# Patient Record
Sex: Female | Born: 1965 | Race: Black or African American | Hispanic: No | Smoking: Current every day smoker
Health system: Southern US, Community
[De-identification: ages and names within clinical notes are randomized; demographics above are authoritative.]

## PROBLEM LIST (undated history)

## (undated) DIAGNOSIS — F32A Depression, unspecified: Secondary | ICD-10-CM

## (undated) DIAGNOSIS — J849 Interstitial pulmonary disease, unspecified: Secondary | ICD-10-CM

## (undated) DIAGNOSIS — R51 Headache: Secondary | ICD-10-CM

## (undated) DIAGNOSIS — G4700 Insomnia, unspecified: Secondary | ICD-10-CM

## (undated) DIAGNOSIS — I509 Heart failure, unspecified: Secondary | ICD-10-CM

## (undated) DIAGNOSIS — F1010 Alcohol abuse, uncomplicated: Secondary | ICD-10-CM

## (undated) DIAGNOSIS — I639 Cerebral infarction, unspecified: Secondary | ICD-10-CM

## (undated) DIAGNOSIS — I5032 Chronic diastolic (congestive) heart failure: Secondary | ICD-10-CM

## (undated) DIAGNOSIS — F419 Anxiety disorder, unspecified: Secondary | ICD-10-CM

## (undated) DIAGNOSIS — M1990 Unspecified osteoarthritis, unspecified site: Secondary | ICD-10-CM

## (undated) DIAGNOSIS — I10 Essential (primary) hypertension: Secondary | ICD-10-CM

## (undated) DIAGNOSIS — J9620 Acute and chronic respiratory failure, unspecified whether with hypoxia or hypercapnia: Secondary | ICD-10-CM

## (undated) DIAGNOSIS — J84115 Respiratory bronchiolitis interstitial lung disease: Secondary | ICD-10-CM

## (undated) DIAGNOSIS — J9621 Acute and chronic respiratory failure with hypoxia: Secondary | ICD-10-CM

## (undated) DIAGNOSIS — G4730 Sleep apnea, unspecified: Secondary | ICD-10-CM

## (undated) DIAGNOSIS — J189 Pneumonia, unspecified organism: Secondary | ICD-10-CM

## (undated) DIAGNOSIS — IMO0001 Reserved for inherently not codable concepts without codable children: Secondary | ICD-10-CM

## (undated) DIAGNOSIS — Z9981 Dependence on supplemental oxygen: Secondary | ICD-10-CM

## (undated) DIAGNOSIS — F209 Schizophrenia, unspecified: Secondary | ICD-10-CM

## (undated) DIAGNOSIS — J9601 Acute respiratory failure with hypoxia: Secondary | ICD-10-CM

## (undated) DIAGNOSIS — R569 Unspecified convulsions: Secondary | ICD-10-CM

## (undated) DIAGNOSIS — F329 Major depressive disorder, single episode, unspecified: Secondary | ICD-10-CM

## (undated) HISTORY — DX: Acute respiratory failure with hypoxia: J96.01

## (undated) HISTORY — DX: Acute and chronic respiratory failure with hypoxia: J96.21

## (undated) HISTORY — DX: Acute and chronic respiratory failure, unspecified whether with hypoxia or hypercapnia: J96.20

## (undated) HISTORY — DX: Pneumonia, unspecified organism: J18.9

## (undated) HISTORY — PX: BACK SURGERY: SHX140

## (undated) HISTORY — PX: MULTIPLE TOOTH EXTRACTIONS: SHX2053

---

## 2010-11-04 ENCOUNTER — Emergency Department (HOSPITAL_COMMUNITY)
Admission: EM | Admit: 2010-11-04 | Discharge: 2010-11-04 | Disposition: A | Discharge status: Home / Self Care | Payer: Self-pay | Attending: Emergency Medicine | Admitting: Emergency Medicine

## 2010-11-04 DIAGNOSIS — K006 Disturbances in tooth eruption: Secondary | ICD-10-CM | POA: Insufficient documentation

## 2010-11-04 DIAGNOSIS — K047 Periapical abscess without sinus: Secondary | ICD-10-CM | POA: Insufficient documentation

## 2010-11-04 DIAGNOSIS — R220 Localized swelling, mass and lump, head: Secondary | ICD-10-CM | POA: Insufficient documentation

## 2010-11-04 DIAGNOSIS — K029 Dental caries, unspecified: Secondary | ICD-10-CM | POA: Insufficient documentation

## 2010-11-04 DIAGNOSIS — M549 Dorsalgia, unspecified: Secondary | ICD-10-CM | POA: Insufficient documentation

## 2010-11-04 DIAGNOSIS — K089 Disorder of teeth and supporting structures, unspecified: Secondary | ICD-10-CM | POA: Insufficient documentation

## 2010-11-04 DIAGNOSIS — G8929 Other chronic pain: Secondary | ICD-10-CM | POA: Insufficient documentation

## 2010-11-04 DIAGNOSIS — M249 Joint derangement, unspecified: Secondary | ICD-10-CM | POA: Insufficient documentation

## 2010-11-04 DIAGNOSIS — R221 Localized swelling, mass and lump, neck: Secondary | ICD-10-CM | POA: Insufficient documentation

## 2011-01-14 ENCOUNTER — Emergency Department (HOSPITAL_COMMUNITY)
Admission: EM | Admit: 2011-01-14 | Discharge: 2011-01-14 | Disposition: A | Discharge status: Home / Self Care | Payer: Self-pay | Attending: Emergency Medicine | Admitting: Emergency Medicine

## 2011-01-14 ENCOUNTER — Emergency Department (HOSPITAL_COMMUNITY): Payer: Self-pay

## 2011-01-14 DIAGNOSIS — M545 Low back pain, unspecified: Secondary | ICD-10-CM | POA: Insufficient documentation

## 2011-01-14 DIAGNOSIS — M25569 Pain in unspecified knee: Secondary | ICD-10-CM | POA: Insufficient documentation

## 2011-01-14 DIAGNOSIS — T148XXA Other injury of unspecified body region, initial encounter: Secondary | ICD-10-CM | POA: Insufficient documentation

## 2011-01-14 DIAGNOSIS — M79609 Pain in unspecified limb: Secondary | ICD-10-CM | POA: Insufficient documentation

## 2011-01-14 DIAGNOSIS — R000 Tachycardia, unspecified: Secondary | ICD-10-CM | POA: Insufficient documentation

## 2011-01-14 DIAGNOSIS — F17200 Nicotine dependence, unspecified, uncomplicated: Secondary | ICD-10-CM | POA: Insufficient documentation

## 2011-01-14 DIAGNOSIS — Z23 Encounter for immunization: Secondary | ICD-10-CM | POA: Insufficient documentation

## 2011-01-14 LAB — POCT PREGNANCY, URINE: Preg Test, Ur: NEGATIVE

## 2011-03-11 ENCOUNTER — Emergency Department (HOSPITAL_COMMUNITY): Payer: Self-pay

## 2011-03-11 ENCOUNTER — Emergency Department (HOSPITAL_COMMUNITY)
Admission: EM | Admit: 2011-03-11 | Discharge: 2011-03-11 | Disposition: A | Discharge status: Home / Self Care | Payer: Self-pay | Attending: Emergency Medicine | Admitting: Emergency Medicine

## 2011-03-11 DIAGNOSIS — R221 Localized swelling, mass and lump, neck: Secondary | ICD-10-CM | POA: Insufficient documentation

## 2011-03-11 DIAGNOSIS — R220 Localized swelling, mass and lump, head: Secondary | ICD-10-CM | POA: Insufficient documentation

## 2011-03-11 DIAGNOSIS — S01409A Unspecified open wound of unspecified cheek and temporomandibular area, initial encounter: Secondary | ICD-10-CM | POA: Insufficient documentation

## 2011-03-11 DIAGNOSIS — G8929 Other chronic pain: Secondary | ICD-10-CM | POA: Insufficient documentation

## 2011-03-11 DIAGNOSIS — M542 Cervicalgia: Secondary | ICD-10-CM | POA: Insufficient documentation

## 2011-03-11 DIAGNOSIS — R51 Headache: Secondary | ICD-10-CM | POA: Insufficient documentation

## 2011-03-11 DIAGNOSIS — R079 Chest pain, unspecified: Secondary | ICD-10-CM | POA: Insufficient documentation

## 2011-03-11 DIAGNOSIS — R1012 Left upper quadrant pain: Secondary | ICD-10-CM | POA: Insufficient documentation

## 2011-03-11 DIAGNOSIS — M549 Dorsalgia, unspecified: Secondary | ICD-10-CM | POA: Insufficient documentation

## 2011-03-11 LAB — CBC
MCH: 29 pg (ref 26.0–34.0)
Platelets: 226 10*3/uL (ref 150–400)
RBC: 4.66 MIL/uL (ref 3.87–5.11)
WBC: 8 10*3/uL (ref 4.0–10.5)

## 2011-03-11 LAB — COMPREHENSIVE METABOLIC PANEL
ALT: 21 U/L (ref 0–35)
AST: 26 U/L (ref 0–37)
CO2: 21 mEq/L (ref 19–32)
Calcium: 9.2 mg/dL (ref 8.4–10.5)
Chloride: 106 mEq/L (ref 96–112)
GFR calc non Af Amer: >60 mL/min (ref >60)
Potassium: 4.2 mEq/L (ref 3.5–5.1)
Sodium: 139 mEq/L (ref 135–145)

## 2011-03-11 LAB — DIFFERENTIAL
Basophils Absolute: 0 10*3/uL (ref 0.0–0.1)
Basophils Relative: 1 % (ref 0–1)
Eosinophils Absolute: 0.1 10*3/uL (ref 0.0–0.7)
Neutrophils Relative %: 44 % (ref 43–77)

## 2011-03-11 LAB — POCT PREGNANCY, URINE: Preg Test, Ur: NEGATIVE

## 2011-03-11 MED ORDER — IOHEXOL 300 MG/ML  SOLN
100.0000 mL | Freq: Once | INTRAMUSCULAR | Status: AC | PRN
  Administered 2011-03-11: 100 mL via INTRAVENOUS

## 2011-04-09 ENCOUNTER — Inpatient Hospital Stay (INDEPENDENT_AMBULATORY_CARE_PROVIDER_SITE_OTHER)
Admission: RE | Admit: 2011-04-09 | Discharge: 2011-04-09 | Disposition: A | Discharge status: Home / Self Care | Payer: Self-pay | Source: Ambulatory Visit | Attending: Family Medicine | Admitting: Family Medicine

## 2011-04-09 DIAGNOSIS — S0180XA Unspecified open wound of other part of head, initial encounter: Secondary | ICD-10-CM

## 2011-08-07 ENCOUNTER — Encounter (HOSPITAL_COMMUNITY): Payer: Self-pay | Admitting: Emergency Medicine

## 2011-08-07 ENCOUNTER — Emergency Department (HOSPITAL_COMMUNITY)
Admission: EM | Admit: 2011-08-07 | Discharge: 2011-08-07 | Disposition: A | Discharge status: Home / Self Care | Payer: Self-pay | Attending: Emergency Medicine | Admitting: Emergency Medicine

## 2011-08-07 DIAGNOSIS — R05 Cough: Secondary | ICD-10-CM | POA: Insufficient documentation

## 2011-08-07 DIAGNOSIS — R1110 Vomiting, unspecified: Secondary | ICD-10-CM | POA: Insufficient documentation

## 2011-08-07 DIAGNOSIS — R109 Unspecified abdominal pain: Secondary | ICD-10-CM | POA: Insufficient documentation

## 2011-08-07 DIAGNOSIS — R059 Cough, unspecified: Secondary | ICD-10-CM | POA: Insufficient documentation

## 2011-08-07 DIAGNOSIS — R197 Diarrhea, unspecified: Secondary | ICD-10-CM | POA: Insufficient documentation

## 2011-08-07 DIAGNOSIS — R112 Nausea with vomiting, unspecified: Secondary | ICD-10-CM | POA: Insufficient documentation

## 2011-08-07 DIAGNOSIS — F1020 Alcohol dependence, uncomplicated: Secondary | ICD-10-CM | POA: Insufficient documentation

## 2011-08-07 DIAGNOSIS — E860 Dehydration: Secondary | ICD-10-CM | POA: Insufficient documentation

## 2011-08-07 DIAGNOSIS — R10819 Abdominal tenderness, unspecified site: Secondary | ICD-10-CM | POA: Insufficient documentation

## 2011-08-07 DIAGNOSIS — R42 Dizziness and giddiness: Secondary | ICD-10-CM | POA: Insufficient documentation

## 2011-08-07 LAB — CBC
MCHC: 32.7 g/dL (ref 30.0–36.0)
Platelets: 226 10*3/uL (ref 150–400)
RDW: 13.5 % (ref 11.5–15.5)

## 2011-08-07 LAB — URINALYSIS, ROUTINE W REFLEX MICROSCOPIC
Nitrite: NEGATIVE
Specific Gravity, Urine: 1.02 (ref 1.005–1.030)
Urobilinogen, UA: 0.2 mg/dL (ref 0.0–1.0)

## 2011-08-07 LAB — COMPREHENSIVE METABOLIC PANEL
ALT: 21 U/L (ref 0–35)
Alkaline Phosphatase: 75 U/L (ref 39–117)
CO2: 23 mEq/L (ref 19–32)
Calcium: 9.1 mg/dL (ref 8.4–10.5)
GFR calc Af Amer: >90 mL/min (ref >90)
GFR calc non Af Amer: >90 mL/min (ref >90)
Glucose, Bld: 113 mg/dL — ABNORMAL HIGH (ref 70–99)
Sodium: 135 mEq/L (ref 135–145)

## 2011-08-07 LAB — URINE MICROSCOPIC-ADD ON

## 2011-08-07 LAB — LIPASE, BLOOD: Lipase: 23 U/L (ref 11–59)

## 2011-08-07 LAB — DIFFERENTIAL
Basophils Absolute: 0 10*3/uL (ref 0.0–0.1)
Basophils Relative: 0 % (ref 0–1)
Neutro Abs: 6.6 10*3/uL (ref 1.7–7.7)
Neutrophils Relative %: 60 % (ref 43–77)

## 2011-08-07 LAB — POCT PREGNANCY, URINE: Preg Test, Ur: NEGATIVE

## 2011-08-07 MED ORDER — ONDANSETRON HCL 4 MG/2ML IJ SOLN
4.0000 mg | Freq: Once | INTRAMUSCULAR | Status: AC
  Administered 2011-08-07: 4 mg via INTRAVENOUS
  Filled 2011-08-07: qty 2

## 2011-08-07 MED ORDER — PROMETHAZINE HCL 25 MG PO TABS: 25.0000 mg | ORAL_TABLET | Freq: Four times a day (QID) | ORAL | Status: AC | PRN

## 2011-08-07 MED ORDER — SODIUM CHLORIDE 0.9 % IV BOLUS (SEPSIS)
1000.0000 mL | Freq: Once | INTRAVENOUS | Status: AC
  Administered 2011-08-07: 1000 mL via INTRAVENOUS

## 2011-08-07 MED ORDER — GI COCKTAIL ~~LOC~~
30.0000 mL | Freq: Once | ORAL | Status: AC
  Administered 2011-08-07: 30 mL via ORAL
  Filled 2011-08-07: qty 30

## 2011-08-07 NOTE — ED Notes (Signed)
Patient with dizziness, nausea, vomiting and diarrhea since last night.  Patient states she is also having abdominal pain, tender in the upper quadrants than lower quadrants.  Patient has not had any appetite.

## 2011-08-07 NOTE — ED Notes (Signed)
Patient with dizziness, n/v/d that started last night.  Patient also has a cough.   Patient is hypertensive 220/104.  No PCP.
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
## 2011-10-31 ENCOUNTER — Encounter (HOSPITAL_COMMUNITY): Payer: Self-pay | Admitting: Emergency Medicine

## 2011-10-31 ENCOUNTER — Emergency Department (HOSPITAL_COMMUNITY)
Admission: EM | Admit: 2011-10-31 | Discharge: 2011-11-01 | Payer: Self-pay | Attending: Emergency Medicine | Admitting: Emergency Medicine

## 2011-10-31 DIAGNOSIS — IMO0002 Reserved for concepts with insufficient information to code with codable children: Secondary | ICD-10-CM | POA: Insufficient documentation

## 2011-10-31 DIAGNOSIS — X58XXXA Exposure to other specified factors, initial encounter: Secondary | ICD-10-CM | POA: Insufficient documentation

## 2011-10-31 DIAGNOSIS — F1010 Alcohol abuse, uncomplicated: Secondary | ICD-10-CM | POA: Insufficient documentation

## 2011-10-31 HISTORY — DX: Alcohol abuse, uncomplicated: F10.10
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
## 2011-11-01 ENCOUNTER — Emergency Department (HOSPITAL_COMMUNITY): Payer: Self-pay

## 2011-11-01 LAB — COMPREHENSIVE METABOLIC PANEL
AST: 86 U/L — ABNORMAL HIGH (ref 0–37)
Albumin: 3.7 g/dL (ref 3.5–5.2)
BUN: 12 mg/dL (ref 6–23)
Calcium: 7.9 mg/dL — ABNORMAL LOW (ref 8.4–10.5)
Chloride: 105 mEq/L (ref 96–112)
Creatinine, Ser: 0.61 mg/dL (ref 0.50–1.10)
Total Protein: 7.8 g/dL (ref 6.0–8.3)

## 2011-11-01 LAB — CBC
HCT: 39.4 % (ref 36.0–46.0)
Hemoglobin: 12.9 g/dL (ref 12.0–15.0)
MCH: 28.2 pg (ref 26.0–34.0)
MCHC: 32.7 g/dL (ref 30.0–36.0)
RDW: 14 % (ref 11.5–15.5)

## 2011-11-01 LAB — RAPID URINE DRUG SCREEN, HOSP PERFORMED
Amphetamines: NOT DETECTED
Benzodiazepines: NOT DETECTED
Cocaine: NOT DETECTED
Opiates: NOT DETECTED
Tetrahydrocannabinol: NOT DETECTED

## 2011-11-01 LAB — GLUCOSE, CAPILLARY

## 2011-11-01 LAB — DIFFERENTIAL
Basophils Absolute: 0 10*3/uL (ref 0.0–0.1)
Basophils Relative: 1 % (ref 0–1)
Eosinophils Absolute: 0.1 10*3/uL (ref 0.0–0.7)
Monocytes Absolute: 0.6 10*3/uL (ref 0.1–1.0)
Monocytes Relative: 7 % (ref 3–12)
Neutro Abs: 4.2 10*3/uL (ref 1.7–7.7)

## 2011-11-01 LAB — ETHANOL: Alcohol, Ethyl (B): 238 mg/dL — ABNORMAL HIGH (ref 0–11)
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
## 2011-11-01 NOTE — ED Notes (Signed)
More ice water given to patient
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
## 2011-11-01 NOTE — ED Provider Notes (Signed)
Medical screening examination/treatment/procedure(s) were performed by non-physician practitioner and as supervising physician I was immediately available for consultation/collaboration.   Teressa Lower, MD 11/01/11 [phone]

## 2011-11-01 NOTE — ED Notes (Signed)
After discussing with CHarge nurse and the PA, the team decided to keep her longer for observation, lab work will be drawn to further assist with the plan of care. Phlebotomy at bedside
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
## 2011-11-01 NOTE — ED Notes (Signed)
Pt transported to CT
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
## 2011-11-01 NOTE — ED Notes (Signed)
Attempted to call home number, no answer

## 2011-11-01 NOTE — ED Notes (Signed)
Bus pass given

## 2011-11-01 NOTE — ED Notes (Signed)
Pt returns to room
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
## 2011-11-01 NOTE — ED Notes (Signed)
Report received from Night RN. First contact with patient. Pt is asleep upon entering the room, pt sat 97% on 2 L [place], and 92% RA, pt woke up after this RN called her name. Pt noticed to have abrasions on her face, but in no acute distress.

## 2011-11-01 NOTE — Discharge Instructions (Signed)
Please read and follow all provided instructions.  Your diagnoses today include:  1. Intoxication     Tests performed today include:  CT of your head, face, and neck that did not show any broken bones or bleeding in your brain  X-rays of your right hip and leg that did not show show any broken bones  Blood tests that showed high alcohol  Vital signs. See below for your results today.   Medications prescribed:   None  Home care instructions:  Follow any educational materials contained in this packet.  Stop drinking alcohol to excess.   Follow-up instructions: Please follow-up with your primary care provider in the next 3 days for further evaluation of your symptoms. If you do not have a primary care doctor -- see below for referral information.   Return instructions:   Please return to the Emergency Department if you experience worsening symptoms.   Please return if you have any other emergent concerns.  Additional Information:  Your vital signs today were: BP 128/72  Pulse 94  Temp(Src) 98.6 F (37 C) (Oral)  Resp 16  SpO2 93% If your blood pressure (BP) was elevated above 135/85 this visit, please have this repeated by your doctor within one month. -------------- No Primary Care Doctor Call [facility]  [phone] Other agencies that provide inexpensive medical care    [place]  [facility]  [place]  [phone]    [facility]  [phone]    [facility]  [facility]  [phone] -------------- RESOURCE GUIDE:  Dental Problems  Patients with Medicaid: [facility] [phone] [address] Phone:  [phone]                                                   Phone:  [phone]  If unable to pay or uninsured, contact:  [facility] or [facility]. to become qualified for the adult dental clinic.  Chronic Pain Problems Contact [facility]  [phone] Patients need to be referred by their primary care doctor.  Insufficient Money for Medicine Contact [employer]:  call "211" or [place] [phone].  [place]  [phone] [facility]  [place]   [phone] (emergency services [phone])  Substance Abuse [facility]  [phone] [facility] [phone] [place] [phone] Chinita Pester [phone] Residential & Outpatient Substance Abuse Program  [phone]  Abuse/Neglect [place] [phone] [place] [phone] (After Hours)  Emergency [place] [phone]  [place] [facility] [place] [phone] [place] [phone]  [facility] [place]  [facility] Dept. [address]      [place] Phone:  [phone]                                   Phone:  [phone]                 Phone:  [place] Phone:  [place] [phone] (After Hours)

## 2011-11-01 NOTE — ED Notes (Signed)
Pt placed on O2 @ 2L [place], O2 increased to 97%

## 2011-11-01 NOTE — ED Notes (Signed)
Walk patient to the bathroom on crutches.  She did well without assists.

## 2011-11-01 NOTE — ED Notes (Signed)
Pt assisted to the restroom and back

## 2011-11-01 NOTE — ED Notes (Addendum)
Pt returned from xray, went back to sleep, unable to ambulate without assistance

## 2011-11-03 NOTE — ED Provider Notes (Signed)
Medical screening examination/treatment/procedure(s) were performed by non-physician practitioner and as supervising physician I was immediately available for consultation/collaboration.   Teressa Lower, MD 11/03/11 [phone]

## 2011-11-13 ENCOUNTER — Inpatient Hospital Stay (HOSPITAL_COMMUNITY)
Admission: AD | Admit: 2011-11-13 | Discharge: 2011-11-14 | Disposition: A | Discharge status: Home / Self Care | Payer: Self-pay | Source: Ambulatory Visit | Attending: Emergency Medicine | Admitting: Emergency Medicine

## 2011-11-13 ENCOUNTER — Encounter (HOSPITAL_COMMUNITY): Payer: Self-pay

## 2011-11-13 DIAGNOSIS — R109 Unspecified abdominal pain: Secondary | ICD-10-CM | POA: Insufficient documentation

## 2011-11-13 DIAGNOSIS — F1010 Alcohol abuse, uncomplicated: Secondary | ICD-10-CM | POA: Insufficient documentation

## 2011-11-13 DIAGNOSIS — IMO0002 Reserved for concepts with insufficient information to code with codable children: Secondary | ICD-10-CM

## 2011-11-13 HISTORY — DX: Essential (primary) hypertension: I10

## 2011-11-13 HISTORY — DX: Insomnia, unspecified: G47.00

## 2011-11-13 LAB — HCG, SERUM, QUALITATIVE: Preg, Serum: NEGATIVE

## 2011-11-13 LAB — CBC
HCT: 34.9 % — ABNORMAL LOW (ref 36.0–46.0)
Hemoglobin: 11.1 g/dL — ABNORMAL LOW (ref 12.0–15.0)
MCHC: 31.8 g/dL (ref 30.0–36.0)
RBC: 4.02 MIL/uL (ref 3.87–5.11)
WBC: 10.4 10*3/uL (ref 4.0–10.5)

## 2011-11-13 LAB — DIFFERENTIAL
Basophils Relative: 0 % (ref 0–1)
Lymphocytes Relative: 26 % (ref 12–46)
Monocytes Relative: 6 % (ref 3–12)
Neutro Abs: 7 10*3/uL (ref 1.7–7.7)
Neutrophils Relative %: 67 % (ref 43–77)

## 2011-11-13 LAB — RAPID URINE DRUG SCREEN, HOSP PERFORMED
Barbiturates: NOT DETECTED
Cocaine: NOT DETECTED
Tetrahydrocannabinol: NOT DETECTED

## 2011-11-13 LAB — TYPE AND SCREEN
ABO/RH(D): O POS
Antibody Screen: NEGATIVE
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
## 2011-11-13 NOTE — ED Notes (Signed)
CVU:DT14<HO> Expected date:<BR> Expected time:<BR> Means of arrival:<BR> Comments:<BR> Hold bed for [facility] -pt from MAU- ETOH
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
## 2011-11-14 ENCOUNTER — Encounter (HOSPITAL_COMMUNITY): Payer: Self-pay | Admitting: Emergency Medicine

## 2011-11-14 LAB — COMPREHENSIVE METABOLIC PANEL
Alkaline Phosphatase: 67 U/L (ref 39–117)
BUN: 10 mg/dL (ref 6–23)
Chloride: 108 mEq/L (ref 96–112)
GFR calc Af Amer: >90 mL/min (ref >90)
Glucose, Bld: 90 mg/dL (ref 70–99)
Potassium: 3.7 mEq/L (ref 3.5–5.1)
Total Bilirubin: 0.2 mg/dL — ABNORMAL LOW (ref 0.3–1.2)

## 2011-11-14 MED ORDER — SODIUM CHLORIDE 0.9 % IV BOLUS (SEPSIS)
1000.0000 mL | Freq: Once | INTRAVENOUS | Status: AC
  Administered 2011-11-14: 1000 mL via INTRAVENOUS

## 2011-11-14 NOTE — ED Provider Notes (Signed)
History     CSN: [id number]  Arrival date & time 11/13/11  2052   First MD Initiated Contact with Patient 11/14/11 0014      Chief Complaint  Patient presents with  . Abdominal Pain    (Consider location/radiation/quality/duration/timing/severity/associated sxs/prior treatment) HPI Patient is a 46 year old female with history of alcohol abuse who presents today by EMS after being initially evaluated at [facility]. Patient apparently was concerned she could be pregnant. Pregnancy test was negative and CBC at that location was unremarkable. Patient was transferred for management of her alcohol intoxication. Alcohol level was 401 at [facility]. Patient complains a 10 out of 10 abdominal pain. This is diffuse without radiation. Level V caveat history is otherwise limited secondary to patient intoxication. She denies history of pancreatitis or liver problems. Vital signs are stable. Past Medical History  Diagnosis Date  . Alcohol abuse   . Hypertension   . Insomnia     Past Surgical History  Procedure Date  . No past surgeries     History reviewed. No pertinent family history.  History  Substance Use Topics  . Smoking status: Current Some Day Smoker -- 0.5 packs/day    Types: Cigarettes  . Smokeless tobacco: Not on file  . Alcohol Use: Yes     patient drinks 5-6 40oz beers a day    OB History    Grav Para Term Preterm Abortions TAB SAB Ect Mult Living   0               Review of Systems  Unable to perform ROS: Other    Allergies  Review of patient's allergies indicates no known allergies.  Home Medications   Current Outpatient Rx  Name Route Sig Dispense Refill  . QUETIAPINE FUMARATE 50 MG PO TABS Oral Take 50 mg by mouth at bedtime.      BP 107/50  Pulse 87  Temp(Src) 98.1 F (36.7 C) (Oral)  Resp 17  SpO2 95%  LMP 10/14/2011  Physical Exam  Nursing note and vitals reviewed. Constitutional: She is oriented to person, place, and time. She  appears well-developed and well-nourished. No distress.       Disheveled, smells of alcohol  HENT:  Head: Normocephalic and atraumatic.  Eyes: EOM are normal. Pupils are equal, round, and reactive to light. No scleral icterus.       Scleral injection noted bilaterally  Neck: Normal range of motion.  Cardiovascular: Normal rate, regular rhythm, normal heart sounds and intact distal pulses.  Exam reveals no gallop and no friction rub.   No murmur heard. Pulmonary/Chest: Effort normal and breath sounds normal. No respiratory distress. She has no wheezes. She has no rales.  Abdominal: Soft. Bowel sounds are normal. She exhibits no distension. There is tenderness. There is no rebound and no guarding.       Diffuse tenderness to palpation  Musculoskeletal: Normal range of motion. She exhibits no edema and no tenderness.  Neurological: She is alert and oriented to person, place, and time. No cranial nerve deficit. She exhibits normal muscle tone. Coordination normal.  Skin: Skin is warm and dry. No rash noted.    ED Course  Procedures (including critical care time)  Labs Reviewed  CBC - Abnormal; Notable for the following:    Hemoglobin 11.1 (*)    HCT 34.9 (*)    All other components within normal limits  ETHANOL - Abnormal; Notable for the following:    Alcohol, Ethyl (B) 401 (*)  All other components within normal limits  URINE RAPID DRUG SCREEN (HOSP PERFORMED)  DIFFERENTIAL  TYPE AND SCREEN  HCG, SERUM, QUALITATIVE  ABO/RH  LIPASE, BLOOD  COMPREHENSIVE METABOLIC PANEL   No results found.   1. Intoxication       MDM  Patient was evaluated by myself. She complained of diffuse abdominal pain. Patient had CMP as well as lipase added to her workup. IV fluids were ordered. Patient was cooperative and protecting her airway. Previous evaluation had included an unremarkable CBC as well as an alcohol level for 1 and a negative pregnancy test. Patient has labs pending at this  time.  Patient had no findings concerning for pancreatitis or significant abnormalities of her liver function. Patient had improvement in her level of interaction with IV fluids. Patient will be discharged home once she is able to ambulate independently.        Chauncy Passy, MD 11/14/11 [phone]

## 2011-11-14 NOTE — Discharge Instructions (Signed)
RESOURCE GUIDE  Dental Problems  Patients with Medicaid: [facility]                     [phone]                                           Phone:  [phone]                                                  If unable to pay or uninsured, contact:  [facility] or [facility]. to become qualified for the adult dental clinic.  Chronic Pain Problems Contact [facility]  [phone] Patients need to be referred by their primary care doctor.  Insufficient Money for Medicine Contact [employer]:  call "211" or [place] [phone].  No Primary Care Doctor Call [facility]  [phone] Other agencies that provide inexpensive medical care    [place]  [phone]    [facility]  [place]  [phone]    [facility]  [phone]    [facility]  [phone]    [facility]  [phone]  Substance Abuse Resources Alcohol and Drug Services  [place] [phone] [facility] [phone] Residential & Outpatient Substance Abuse Program  [place]  [phone] [place]   [phone] (emergency services [phone])  Abuse/Neglect [place] [phone] [place] [phone] (After Hours)  Emergency [place] [phone]  [place] at the [place] [phone] [place] [phone]  MRSA Hotline #:   [phone]    [facility] Dept. [address]         [place] Phone:  [phone]                                  Phone:  [phone]                   Phone:  [place] Phone:  [place] [phone]  [phone] (After Hours)

## 2011-11-30 ENCOUNTER — Inpatient Hospital Stay (HOSPITAL_COMMUNITY)
Admission: AD | Admit: 2011-11-30 | Discharge: 2011-11-30 | Disposition: A | Discharge status: Home / Self Care | Payer: Self-pay | Source: Ambulatory Visit | Attending: Obstetrics & Gynecology | Admitting: Obstetrics & Gynecology

## 2011-11-30 DIAGNOSIS — F1010 Alcohol abuse, uncomplicated: Secondary | ICD-10-CM | POA: Insufficient documentation

## 2011-11-30 DIAGNOSIS — IMO0002 Reserved for concepts with insufficient information to code with codable children: Secondary | ICD-10-CM

## 2011-11-30 DIAGNOSIS — R109 Unspecified abdominal pain: Secondary | ICD-10-CM | POA: Insufficient documentation

## 2011-11-30 LAB — POCT PREGNANCY, URINE: Preg Test, Ur: NEGATIVE

## 2011-11-30 NOTE — MAU Note (Addendum)
Patient arrived via EMS. Was taken to restroom per patient request upon arrival. UPT performed by Dr. Glenna Durand request & found to be negative. Dr. Nehemiah Settle discussed need for transfer due to patient being intoxicated & not pregnant. Patient refuses to be transferred to [facility] and now refuses to be treated in MAU. Patient demanded that we remove the IV that EMS placed. Patient signed refusal for transfer form & elopement form.

## 2011-11-30 NOTE — MAU Provider Note (Signed)
  History     CSN: [id number]  Arrival date and time: 11/30/11 2247   None     No chief complaint on file.  HPI Is a 46 year old woman who was found by [facility] PD to be intoxicated with responded to a call. Patient was having slurred speech and unsteady gait. EMS was called the patient was brought to the MAU as she told EMS that she was pregnant and concerned that she was having a miscarriage due to the abdominal pain. Her blood pressure was 140s over 100. She denies drinking alcohol despite spilling strongly of alcohol.    Past Medical History  Diagnosis Date  . Alcohol abuse   . Hypertension   . Insomnia     Past Surgical History  Procedure Date  . No past surgeries     No family history on file.  History  Substance Use Topics  . Smoking status: Current Some Day Smoker -- 0.5 packs/day    Types: Cigarettes  . Smokeless tobacco: Not on file  . Alcohol Use: Yes     patient drinks 5-6 40oz beers a day    Allergies: No Known Allergies  Prescriptions prior to admission  Medication Sig Dispense Refill  . QUEtiapine (SEROQUEL) 50 MG tablet Take 50 mg by mouth at bedtime.        ROS Physical Exam   Blood pressure 134/100, pulse 84, temperature 98.1 F (36.7 C), temperature source Oral, resp. rate 20, last menstrual period 10/14/2011, SpO2 96.00%.  Physical Exam  Constitutional: She is oriented to person, place, and time. She appears well-developed and well-nourished.  GI: Bowel sounds are normal.  Neurological: She is alert and oriented to person, place, and time.  Psychiatric: Her mood appears anxious. Her affect is labile. Her speech is delayed and slurred. She is agitated and aggressive.   Urine pregnancy test negative MAU Course  Procedures    Assessment and Plan  #1 alcohol intoxication  Tended to transfer the patient to [facility] due to intoxication. Dr. Venora Maples accepted patient in transfer. Patient refused to go to hospital in left [place]. Patient refused to sign  forms. IV line pulled and patient left the hospital.  Loma [place] JEHIEL 11/30/2011, 10:54 PM

## 2011-12-05 ENCOUNTER — Inpatient Hospital Stay (HOSPITAL_COMMUNITY)
Admission: AD | Admit: 2011-12-05 | Discharge: 2011-12-05 | Disposition: A | Discharge status: Home / Self Care | Payer: Self-pay | Source: Ambulatory Visit | Attending: Obstetrics & Gynecology | Admitting: Obstetrics & Gynecology

## 2011-12-05 ENCOUNTER — Encounter (HOSPITAL_COMMUNITY): Payer: Self-pay | Admitting: Obstetrics and Gynecology

## 2011-12-05 DIAGNOSIS — R109 Unspecified abdominal pain: Secondary | ICD-10-CM | POA: Insufficient documentation

## 2011-12-05 DIAGNOSIS — F1010 Alcohol abuse, uncomplicated: Secondary | ICD-10-CM | POA: Insufficient documentation

## 2011-12-05 LAB — URINALYSIS, ROUTINE W REFLEX MICROSCOPIC
Bilirubin Urine: NEGATIVE
Ketones, ur: NEGATIVE mg/dL
Nitrite: NEGATIVE
Protein, ur: NEGATIVE mg/dL
Urobilinogen, UA: 0.2 mg/dL (ref 0.0–1.0)

## 2011-12-05 MED ORDER — GI COCKTAIL ~~LOC~~
30.0000 mL | Freq: Once | ORAL | Status: AC
  Administered 2011-12-05: 30 mL via ORAL
  Filled 2011-12-05: qty 30

## 2011-12-05 NOTE — MAU Provider Note (Signed)
History   Pt presents today c/o "naval pain". She states the pain comes and goes. She is currently intoxicated. She denies vag dc, bleeding, fever, or any other sx. She states she has only had a "40" to drink today. She denies any other problems.  CSN: [id number]  Arrival date and time: 12/05/11 1439   First Provider Initiated Contact with Patient 12/05/11 1758      Chief Complaint  Patient presents with  . Abdominal Pain   HPI  OB History    Grav Para Term Preterm Abortions TAB SAB Ect Mult Living   _0      Past Medical History  Diagnosis Date  . Alcohol abuse   . Hypertension   . Insomnia     Past Surgical History  Procedure Date  . No past surgeries     History reviewed. No pertinent family history.  History  Substance Use Topics  . Smoking status: Current Some Day Smoker -- 0.5 packs/day    Types: Cigarettes  . Smokeless tobacco: Not on file  . Alcohol Use: Yes     patient drinks 5-6 40oz beers a day    Allergies: No Known Allergies  No prescriptions prior to admission    Review of Systems  Constitutional: Negative for fever and chills.  Eyes: Negative for blurred vision and double vision.  Respiratory: Negative for cough, hemoptysis, sputum production, shortness of breath and wheezing.   Cardiovascular: Negative for chest pain and palpitations.  Gastrointestinal: Positive for abdominal pain. Negative for heartburn, nausea, vomiting, diarrhea and constipation.  Genitourinary: Negative for dysuria, urgency, frequency and hematuria.  Neurological: Negative for dizziness and headaches.  Psychiatric/Behavioral: Negative for depression and suicidal ideas.   Physical Exam   Blood pressure 132/74, pulse 90, temperature 97.8 F (36.6 C), temperature source Oral, resp. rate 18, height _1  (1.575 m), weight 159 lb 3.2 oz (72.213 kg), last menstrual period 10/05/2011.  Physical Exam  Nursing note and vitals reviewed. Constitutional: She is  oriented to person, place, and time. She appears well-developed and well-nourished. No distress.  HENT:  Head: Normocephalic.  Eyes: Conjunctivae are normal. Pupils are equal, round, and reactive to light.  GI: Soft. She exhibits no distension and no mass. There is tenderness (Some very mild epigastri pain.). There is no rebound and no guarding.  Neurological: She is alert and oriented to person, place, and time. She displays normal reflexes. No cranial nerve deficit. She exhibits normal muscle tone. Coordination normal.  Skin: Skin is warm and dry. She is not diaphoretic.  Psychiatric: She has a normal mood and affect. Her behavior is normal. Judgment and thought content normal.    MAU Course  Procedures  Results for orders placed during the hospital encounter of 12/05/11 (from the past 48 hour(s))  URINALYSIS, ROUTINE W REFLEX MICROSCOPIC     Status: Abnormal   Collection Time   12/05/11  2:45 PM      Component Value Range Comment   Color, Urine YELLOW  YELLOW     APPearance CLEAR  CLEAR     Specific Gravity, Urine <1.005 (*) 1.005 - 1.030     pH 5.5  5.0 - 8.0     Glucose, UA NEGATIVE  NEGATIVE (mg/dL)    Hgb urine dipstick MODERATE (*) NEGATIVE     Bilirubin Urine NEGATIVE  NEGATIVE     Ketones, ur NEGATIVE  NEGATIVE (mg/dL)    Protein, ur NEGATIVE  NEGATIVE (  mg/dL)    Urobilinogen, UA 0.2  0.0 - 1.0 (mg/dL)    Nitrite NEGATIVE  NEGATIVE     Leukocytes, UA NEGATIVE  NEGATIVE    URINE MICROSCOPIC-ADD ON     Status: Normal   Collection Time   12/05/11  2:45 PM      Component Value Range Comment   Squamous Epithelial / LPF RARE  RARE     RBC / HPF 0-2  <3 (RBC/hpf)    Bacteria, UA RARE  RARE     GI cocktail given to pt.  Discussed pt with Dr. Harolyn Rutherford.  Assessment and Plan  Abd pain: pt desires to leave. She states she is feeling fine. She has no other complaints. Discussed diet, activity, risks, and precautions.  Alinda Deem. Aradhana Gin III, DrHSc, MPAS, PA-C  12/05/2011, 6:24 PM

## 2011-12-05 NOTE — MAU Provider Note (Signed)
Attestation of Attending Supervision of Advanced Practitioner: Evaluation and management procedures were performed by the [facility] Fellow/PA/CNM/NP under my supervision and collaboration. Chart reviewed, and agree with management and plan.  Verita Schneiders, M.D. 12/05/2011 8:21 PM

## 2011-12-05 NOTE — MAU Note (Signed)
Pt presents to MAU with chief complaint of naval pain. Pt says it feels like someone is pulling at her belly button, says there is a big knot on her belly button. Pt is pre-menopausal, last period 2 months ago. The pain is chronic; has been going on for several months. Pt says its hard for her to go to the bathroom; urinate and move bowels.

## 2011-12-05 NOTE — MAU Note (Signed)
Patient admits to drinking a 40 ounce beer and a can of beer today, patient's speech is slurred.

## 2011-12-29 ENCOUNTER — Emergency Department (HOSPITAL_COMMUNITY)
Admission: EM | Admit: 2011-12-29 | Discharge: 2011-12-30 | Disposition: A | Discharge status: Home / Self Care | Payer: Medicaid Other | Attending: Emergency Medicine | Admitting: Emergency Medicine

## 2011-12-29 ENCOUNTER — Encounter (HOSPITAL_COMMUNITY): Payer: Self-pay | Admitting: *Deleted

## 2011-12-29 DIAGNOSIS — S0990XA Unspecified injury of head, initial encounter: Secondary | ICD-10-CM

## 2011-12-29 DIAGNOSIS — F10929 Alcohol use, unspecified with intoxication, unspecified: Secondary | ICD-10-CM

## 2011-12-29 DIAGNOSIS — W03XXXA Other fall on same level due to collision with another person, initial encounter: Secondary | ICD-10-CM | POA: Insufficient documentation

## 2011-12-29 DIAGNOSIS — M546 Pain in thoracic spine: Secondary | ICD-10-CM | POA: Insufficient documentation

## 2011-12-29 DIAGNOSIS — H11419 Vascular abnormalities of conjunctiva, unspecified eye: Secondary | ICD-10-CM | POA: Insufficient documentation

## 2011-12-29 DIAGNOSIS — M545 Low back pain, unspecified: Secondary | ICD-10-CM | POA: Insufficient documentation

## 2011-12-29 DIAGNOSIS — M549 Dorsalgia, unspecified: Secondary | ICD-10-CM

## 2011-12-29 DIAGNOSIS — F1010 Alcohol abuse, uncomplicated: Secondary | ICD-10-CM | POA: Insufficient documentation

## 2011-12-29 DIAGNOSIS — M5380 Other specified dorsopathies, site unspecified: Secondary | ICD-10-CM | POA: Insufficient documentation
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
## 2011-12-30 ENCOUNTER — Encounter (HOSPITAL_COMMUNITY): Payer: Self-pay | Admitting: Radiology

## 2011-12-30 ENCOUNTER — Emergency Department (HOSPITAL_COMMUNITY): Payer: Medicaid Other

## 2011-12-30 LAB — URINALYSIS, ROUTINE W REFLEX MICROSCOPIC
Bilirubin Urine: NEGATIVE
Glucose, UA: NEGATIVE mg/dL
Hgb urine dipstick: NEGATIVE
Ketones, ur: NEGATIVE mg/dL
Leukocytes, UA: NEGATIVE
Nitrite: NEGATIVE
Protein, ur: NEGATIVE mg/dL
Specific Gravity, Urine: 1.009 (ref 1.005–1.030)
Urobilinogen, UA: 0.2 mg/dL (ref 0.0–1.0)
pH: 5 (ref 5.0–8.0)

## 2011-12-30 LAB — ETHANOL: Alcohol, Ethyl (B): 348 mg/dL — ABNORMAL HIGH (ref 0–11)

## 2011-12-30 LAB — POCT PREGNANCY, URINE: Preg Test, Ur: NEGATIVE

## 2011-12-30 MED ORDER — HYDROCODONE-ACETAMINOPHEN 5-325 MG PO TABS
1.0000 | ORAL_TABLET | Freq: Once | ORAL | Status: AC
  Administered 2011-12-30: 1 via ORAL
  Filled 2011-12-30: qty 1

## 2011-12-30 MED ORDER — LORAZEPAM 2 MG/ML IJ SOLN
1.0000 mg | Freq: Once | INTRAMUSCULAR | Status: AC
  Administered 2011-12-30: 1 mg via INTRAMUSCULAR
  Filled 2011-12-30: qty 1

## 2011-12-30 MED ORDER — IBUPROFEN 800 MG PO TABS
800.0000 mg | ORAL_TABLET | Freq: Once | ORAL | Status: AC
  Administered 2011-12-30: 800 mg via ORAL
  Filled 2011-12-30: qty 1

## 2011-12-30 NOTE — ED Provider Notes (Signed)
Patient was originally seen by Dr. Dorna Mai and was signed out to Dr. Vanessa Kick after she was intoxicated and had neck injury was sent to the CDU for holding by Dr. Vanessa Kick awaiting for patient to sober and then recheck her neck exam. Up to this point her imaging has been negative however due to her intoxication we cannot completely clear her C-spine. She sitting up in bed eating and drinking and is improving however she still has a significantly elevated EtOH level. Patient states she does not family or friends to come get her from ER and therefore she would be taking the bus home and needs to be more sober before we can safely discharge her from the ER.  Anne Ng, [place] 12/30/11 1404  Patient is ambulating without difficulty to bathroom in ER and is mentating well. Normal coordination. mild soft tissue TTP of neck but good ROM with little pain. 5/5 strength of bilateral UE and normal reflexes. Normal sensation. No concern for central cord compression with no acute findings on imaging of neck. We are checking with social work to see if a taxi voucher is available. If not, patient will be ready for d/c in the near future.   [place], [place] 12/30/11 1510

## 2011-12-30 NOTE — ED Notes (Signed)
Patient opens her eyes when spoken to
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
## 2011-12-30 NOTE — Discharge Instructions (Signed)
 Alcohol Intoxication   You have alcohol intoxication when the amount of alcohol that you have consumed has impaired your ability to mentally and physically function. There are a variety of factors that contribute to the level at which alcohol intoxication can occur, such as age, gender, weight, frequency of alcohol consumption, medication use, and the presence of other medical conditions, such as diabetes, seizures, or heart conditions.   The blood alcohol level test measures the concentration of alcohol in your blood. In most states, your blood alcohol level must be lower than 80 mg/dL (1.61%) to legally drive. However, many dangerous effects of alcohol can occur at much lower levels.   Alcohol directly impairs the normal chemical activity of the brain and is said to be a chemical depressant. Alcohol can cause drowsiness, stupor, respiratory failure, and coma. Other physical effects can include headache, vomiting, vomiting of blood, abdominal pain, a fast heartbeat, difficulty breathing, anxiety, and amnesia. Alcohol intoxication can also lead to dangerous and life-threatening activities, such as fighting, dangerous operation of vehicles or heavy machinery, and risky sexual behavior.   Alcohol can be especially dangerous when taken with other drugs. Some of these drugs are:   Sedatives.   Painkillers.   Marijuana.   Tranquilizers.   Antihistamines.   Muscle relaxants.   Seizure medicine.  Many of the effects of acute alcohol intoxication are temporary. However, repeated alcohol intoxication can lead to severe medical illnesses. If you have alcohol intoxication, you should:   Stay hydrated. Drink enough water and fluids to keep your urine clear or pale yellow. Avoid excessive caffeine because this can further lead to dehydration.   Eat a healthy diet. You may have residual nausea, headache, and loss of appetite, but it is still important that you maintain good nutrition. You can start with clear liquids.   Take  nonsteroidal anti-inflammatory medications as needed for headaches, but make sure to do so with small meals. You should avoid acetaminophen for several days after having alcohol intoxication because the combination of alcohol and acetaminophen can be toxic to your liver.  If you have frequent alcohol intoxication, ask your friends and family if they think you have a drinking problem. For further help, contact:   Your caregiver.   Alcoholics Anonymous (AA).   A drug or alcohol rehabilitation program.  SEEK MEDICAL CARE IF:   You have persistent vomiting.   You have persistent pain in any part of your body.   You do not feel better after a few days.  SEEK IMMEDIATE MEDICAL CARE IF:   You become shaky or tremble when you try to stop drinking.   You shake uncontrollably (seizure).   You throw up (vomit) blood. This may be bright red or it may look like black coffee grounds.   You have blood in the stool. This may be bright red or appear as a black, tarry, bad smelling stool.   You become lightheaded or faint.  ANY OF THESE SYMPTOMS MAY REPRESENT A SERIOUS PROBLEM THAT IS AN EMERGENCY. Do not wait to see if the symptoms will go away. Get medical help right away. Call your local emergency services (911 in [place] DO NOT drive yourself to the hospital.   MAKE SURE YOU:   Understand these instructions.   Will watch your condition.   Will get help right away if you are not doing well or get worse.  Document Released: 03/25/2005 Document Revised: 06/04/2011 Document Reviewed: 12/02/2009   [facility] Patient Information 2012 [place], [place].

## 2011-12-30 NOTE — ED Notes (Signed)
Pt given taxi voucher transportation home.  Per RN and PA, she is not appropriate to ride the city bus.  Per nurse, pt is not interested in substance abuse resources/treatment at this time.

## 2011-12-30 NOTE — ED Notes (Signed)
Room air sats  88-92   Oxygen applied [place] 2liters.

## 2011-12-30 NOTE — ED Provider Notes (Signed)
Medical screening examination/treatment/procedure(s) were performed by non-physician practitioner and as supervising physician I was immediately available for consultation/collaboration.  Barbara Cower, MD 12/30/11 1531

## 2011-12-30 NOTE — ED Notes (Signed)
Pt.ambulate to  Bathroom with assit.pt.had bowelment _0 

## 2011-12-30 NOTE — ED Notes (Signed)
Spoke with jody Education officer, [employer]. She is aware pt wanting to go so she can find somewhere to sleep tonight.

## 2012-02-19 ENCOUNTER — Encounter (HOSPITAL_COMMUNITY): Payer: Self-pay | Admitting: Emergency Medicine

## 2012-02-19 ENCOUNTER — Inpatient Hospital Stay (HOSPITAL_COMMUNITY): Payer: Medicaid Other

## 2012-02-19 ENCOUNTER — Inpatient Hospital Stay (HOSPITAL_COMMUNITY)
Admission: EM | Admit: 2012-02-19 | Discharge: 2012-02-23 | DRG: 897 | Disposition: A | Discharge status: Home / Self Care | Payer: Medicaid Other | Attending: Internal Medicine | Admitting: Internal Medicine

## 2012-02-19 ENCOUNTER — Emergency Department (HOSPITAL_COMMUNITY): Payer: Medicaid Other

## 2012-02-19 DIAGNOSIS — F17200 Nicotine dependence, unspecified, uncomplicated: Secondary | ICD-10-CM | POA: Diagnosis present

## 2012-02-19 DIAGNOSIS — K921 Melena: Secondary | ICD-10-CM | POA: Diagnosis not present

## 2012-02-19 DIAGNOSIS — A599 Trichomoniasis, unspecified: Secondary | ICD-10-CM

## 2012-02-19 DIAGNOSIS — E872 Acidosis, unspecified: Secondary | ICD-10-CM | POA: Diagnosis present

## 2012-02-19 DIAGNOSIS — N390 Urinary tract infection, site not specified: Secondary | ICD-10-CM | POA: Diagnosis present

## 2012-02-19 DIAGNOSIS — N12 Tubulo-interstitial nephritis, not specified as acute or chronic: Secondary | ICD-10-CM

## 2012-02-19 DIAGNOSIS — E860 Dehydration: Secondary | ICD-10-CM

## 2012-02-19 DIAGNOSIS — F1020 Alcohol dependence, uncomplicated: Secondary | ICD-10-CM | POA: Diagnosis present

## 2012-02-19 DIAGNOSIS — E878 Other disorders of electrolyte and fluid balance, not elsewhere classified: Secondary | ICD-10-CM

## 2012-02-19 DIAGNOSIS — R7402 Elevation of levels of lactic acid dehydrogenase (LDH): Secondary | ICD-10-CM | POA: Diagnosis present

## 2012-02-19 DIAGNOSIS — I10 Essential (primary) hypertension: Secondary | ICD-10-CM | POA: Diagnosis present

## 2012-02-19 DIAGNOSIS — F10939 Alcohol use, unspecified with withdrawal, unspecified: Principal | ICD-10-CM | POA: Diagnosis present

## 2012-02-19 DIAGNOSIS — R197 Diarrhea, unspecified: Secondary | ICD-10-CM | POA: Diagnosis present

## 2012-02-19 DIAGNOSIS — F1010 Alcohol abuse, uncomplicated: Secondary | ICD-10-CM

## 2012-02-19 DIAGNOSIS — R7401 Elevation of levels of liver transaminase levels: Secondary | ICD-10-CM | POA: Diagnosis present

## 2012-02-19 DIAGNOSIS — F10239 Alcohol dependence with withdrawal, unspecified: Principal | ICD-10-CM | POA: Diagnosis present

## 2012-02-19 DIAGNOSIS — D509 Iron deficiency anemia, unspecified: Secondary | ICD-10-CM | POA: Diagnosis present

## 2012-02-19 DIAGNOSIS — R112 Nausea with vomiting, unspecified: Secondary | ICD-10-CM

## 2012-02-19 DIAGNOSIS — N179 Acute kidney failure, unspecified: Secondary | ICD-10-CM | POA: Diagnosis present

## 2012-02-19 DIAGNOSIS — B75 Trichinellosis: Secondary | ICD-10-CM | POA: Diagnosis present

## 2012-02-19 DIAGNOSIS — E871 Hypo-osmolality and hyponatremia: Secondary | ICD-10-CM

## 2012-02-19 DIAGNOSIS — N289 Disorder of kidney and ureter, unspecified: Secondary | ICD-10-CM

## 2012-02-19 DIAGNOSIS — E876 Hypokalemia: Secondary | ICD-10-CM

## 2012-02-19 DIAGNOSIS — R109 Unspecified abdominal pain: Secondary | ICD-10-CM

## 2012-02-19 DIAGNOSIS — A088 Other specified intestinal infections: Secondary | ICD-10-CM | POA: Diagnosis present

## 2012-02-19 DIAGNOSIS — D649 Anemia, unspecified: Secondary | ICD-10-CM

## 2012-02-19 DIAGNOSIS — D72829 Elevated white blood cell count, unspecified: Secondary | ICD-10-CM

## 2012-02-19 LAB — CBC WITH DIFFERENTIAL/PLATELET
Eosinophils Absolute: 0 10*3/uL (ref 0.0–0.7)
Eosinophils Relative: 0 % (ref 0–5)
Hemoglobin: 10.9 g/dL — ABNORMAL LOW (ref 12.0–15.0)
Lymphocytes Relative: 8 % — ABNORMAL LOW (ref 12–46)
Lymphs Abs: 1.6 10*3/uL (ref 0.7–4.0)
MCH: 28.8 pg (ref 26.0–34.0)
MCV: 86 fL (ref 78.0–100.0)
Monocytes Relative: 16 % — ABNORMAL HIGH (ref 3–12)
Neutrophils Relative %: 76 % (ref 43–77)
Platelets: 185 10*3/uL (ref 150–400)
RBC: 3.78 MIL/uL — ABNORMAL LOW (ref 3.87–5.11)
WBC: 18.7 10*3/uL — ABNORMAL HIGH (ref 4.0–10.5)

## 2012-02-19 LAB — BASIC METABOLIC PANEL
BUN: 14 mg/dL (ref 6–23)
CO2: 22 mEq/L (ref 19–32)
CO2: 25 mEq/L (ref 19–32)
Chloride: 101 mEq/L (ref 96–112)
GFR calc Af Amer: 75 mL/min — ABNORMAL LOW (ref >90)
GFR calc non Af Amer: 65 mL/min — ABNORMAL LOW (ref >90)
Glucose, Bld: 138 mg/dL — ABNORMAL HIGH (ref 70–99)
Potassium: 2.8 mEq/L — ABNORMAL LOW (ref 3.5–5.1)
Sodium: 131 mEq/L — ABNORMAL LOW (ref 135–145)
Sodium: 136 mEq/L (ref 135–145)

## 2012-02-19 LAB — HEPATIC FUNCTION PANEL
ALT: 34 U/L (ref 0–35)
AST: 50 U/L — ABNORMAL HIGH (ref 0–37)
Albumin: 2.9 g/dL — ABNORMAL LOW (ref 3.5–5.2)
Alkaline Phosphatase: 195 U/L — ABNORMAL HIGH (ref 39–117)
Total Bilirubin: 1.3 mg/dL — ABNORMAL HIGH (ref 0.3–1.2)
Total Protein: 8.4 g/dL — ABNORMAL HIGH (ref 6.0–8.3)

## 2012-02-19 LAB — MAGNESIUM: Magnesium: 2 mg/dL (ref 1.5–2.5)

## 2012-02-19 LAB — RAPID URINE DRUG SCREEN, HOSP PERFORMED
Amphetamines: NOT DETECTED
Barbiturates: NOT DETECTED
Benzodiazepines: NOT DETECTED
Tetrahydrocannabinol: NOT DETECTED

## 2012-02-19 LAB — URINALYSIS, ROUTINE W REFLEX MICROSCOPIC
Glucose, UA: NEGATIVE mg/dL
Specific Gravity, Urine: 1.014 (ref 1.005–1.030)
pH: 5.5 (ref 5.0–8.0)

## 2012-02-19 LAB — SODIUM, URINE, RANDOM: Sodium, Ur: <10 mEq/L

## 2012-02-19 LAB — PREGNANCY, URINE: Preg Test, Ur: NEGATIVE

## 2012-02-19 LAB — URINE MICROSCOPIC-ADD ON

## 2012-02-19 LAB — CBC
Hemoglobin: 9.5 g/dL — ABNORMAL LOW (ref 12.0–15.0)
MCH: 28.5 pg (ref 26.0–34.0)
MCHC: 32.6 g/dL (ref 30.0–36.0)
MCV: 87.4 fL (ref 78.0–100.0)
Platelets: 185 10*3/uL (ref 150–400)

## 2012-02-19 LAB — ETHANOL: Alcohol, Ethyl (B): <11 mg/dL (ref 0–11)

## 2012-02-19 LAB — OCCULT BLOOD X 1 CARD TO LAB, STOOL: Fecal Occult Bld: NEGATIVE

## 2012-02-19 LAB — RETICULOCYTES: Retic Ct Pct: 1.3 % (ref 0.4–3.1)

## 2012-02-19 LAB — CLOSTRIDIUM DIFFICILE BY PCR: Toxigenic C. Difficile by PCR: NEGATIVE

## 2012-02-19 LAB — LIPASE, BLOOD: Lipase: 49 U/L (ref 11–59)

## 2012-02-19 MED ORDER — METRONIDAZOLE 500 MG PO TABS
2000.0000 mg | ORAL_TABLET | Freq: Once | ORAL | Status: AC
  Administered 2012-02-19: 2000 mg via ORAL
  Filled 2012-02-19 (×2): qty 4

## 2012-02-19 MED ORDER — ONDANSETRON HCL 4 MG PO TABS: 4.0000 mg | ORAL_TABLET | Freq: Four times a day (QID) | ORAL | Status: DC | PRN

## 2012-02-19 MED ORDER — ONDANSETRON HCL 4 MG/2ML IJ SOLN
4.0000 mg | Freq: Once | INTRAMUSCULAR | Status: AC
  Administered 2012-02-19: 4 mg via INTRAVENOUS
  Filled 2012-02-19: qty 2

## 2012-02-19 MED ORDER — SODIUM CHLORIDE 0.9 % IV SOLN
1000.0000 mL | Freq: Once | INTRAVENOUS | Status: AC
  Administered 2012-02-19: 1000 mL via INTRAVENOUS

## 2012-02-19 MED ORDER — HEPARIN SODIUM (PORCINE) 5000 UNIT/ML IJ SOLN
5000.0000 [IU] | Freq: Three times a day (TID) | INTRAMUSCULAR | Status: DC
  Administered 2012-02-19 – 2012-02-23 (×10): 5000 [IU] via SUBCUTANEOUS
  Filled 2012-02-19 (×15): qty 1

## 2012-02-19 MED ORDER — DEXTROSE 5 % IV SOLN
1.0000 g | Freq: Once | INTRAVENOUS | Status: AC
  Administered 2012-02-19: 1 g via INTRAVENOUS
  Filled 2012-02-19: qty 10

## 2012-02-19 MED ORDER — ONDANSETRON HCL 4 MG/2ML IJ SOLN
4.0000 mg | Freq: Four times a day (QID) | INTRAMUSCULAR | Status: DC | PRN
  Administered 2012-02-19: 4 mg via INTRAVENOUS
  Filled 2012-02-19: qty 2

## 2012-02-19 MED ORDER — ADULT MULTIVITAMIN W/MINERALS CH
1.0000 | ORAL_TABLET | Freq: Every day | ORAL | Status: DC
  Administered 2012-02-19 – 2012-02-23 (×5): 1 via ORAL
  Filled 2012-02-19 (×5): qty 1

## 2012-02-19 MED ORDER — METOCLOPRAMIDE HCL 5 MG/ML IJ SOLN
10.0000 mg | Freq: Once | INTRAMUSCULAR | Status: AC
  Administered 2012-02-19: 10 mg via INTRAVENOUS
  Filled 2012-02-19: qty 2

## 2012-02-19 MED ORDER — MORPHINE SULFATE 4 MG/ML IJ SOLN
4.0000 mg | Freq: Once | INTRAMUSCULAR | Status: AC
  Administered 2012-02-19: 4 mg via INTRAVENOUS
  Filled 2012-02-19: qty 1

## 2012-02-19 MED ORDER — IOHEXOL 300 MG/ML  SOLN
20.0000 mL | INTRAMUSCULAR | Status: AC
Start: 2012-02-19 — End: 2012-02-19
  Administered 2012-02-19 (×2): 20 mL via ORAL

## 2012-02-19 MED ORDER — ONDANSETRON HCL 4 MG/2ML IJ SOLN: 4.0000 mg | INTRAMUSCULAR | Status: DC | PRN

## 2012-02-19 MED ORDER — MORPHINE SULFATE 4 MG/ML IJ SOLN
4.0000 mg | INTRAMUSCULAR | Status: AC | PRN
  Administered 2012-02-19: 4 mg via INTRAVENOUS
  Filled 2012-02-19: qty 1

## 2012-02-19 MED ORDER — DEXTROSE 5 % IV SOLN
1.0000 g | INTRAVENOUS | Status: DC
  Administered 2012-02-20 – 2012-02-22 (×3): 1 g via INTRAVENOUS
  Filled 2012-02-19 (×4): qty 10

## 2012-02-19 MED ORDER — IOHEXOL 300 MG/ML  SOLN
20.0000 mL | INTRAMUSCULAR | Status: DC
  Administered 2012-02-19: 20 mL via ORAL

## 2012-02-19 MED ORDER — ACETAMINOPHEN 650 MG RE SUPP: 650.0000 mg | Freq: Four times a day (QID) | RECTAL | Status: DC | PRN

## 2012-02-19 MED ORDER — FOLIC ACID 5 MG/ML IJ SOLN
1.0000 mg | Freq: Every day | INTRAMUSCULAR | Status: DC
  Administered 2012-02-19: 1 mg via INTRAVENOUS
  Filled 2012-02-19: qty 0.2

## 2012-02-19 MED ORDER — DIPHENHYDRAMINE HCL 50 MG/ML IJ SOLN
25.0000 mg | Freq: Once | INTRAMUSCULAR | Status: AC
  Administered 2012-02-19: 25 mg via INTRAVENOUS
  Filled 2012-02-19: qty 1

## 2012-02-19 MED ORDER — POTASSIUM CHLORIDE 10 MEQ/100ML IV SOLN
10.0000 meq | INTRAVENOUS | Status: AC
  Administered 2012-02-19 – 2012-02-20 (×4): 10 meq via INTRAVENOUS
  Filled 2012-02-19 (×4): qty 100

## 2012-02-19 MED ORDER — LOPERAMIDE HCL 2 MG PO CAPS
2.0000 mg | ORAL_CAPSULE | ORAL | Status: DC | PRN
  Filled 2012-02-19: qty 1

## 2012-02-19 MED ORDER — HYDROMORPHONE HCL PF 1 MG/ML IJ SOLN: 0.5000 mg | INTRAMUSCULAR | Status: DC | PRN

## 2012-02-19 MED ORDER — ACETAMINOPHEN 325 MG PO TABS
650.0000 mg | ORAL_TABLET | Freq: Four times a day (QID) | ORAL | Status: DC | PRN
  Administered 2012-02-19 – 2012-02-21 (×4): 650 mg via ORAL
  Filled 2012-02-19 (×4): qty 2

## 2012-02-19 MED ORDER — LORAZEPAM 2 MG/ML IJ SOLN
1.0000 mg | Freq: Four times a day (QID) | INTRAMUSCULAR | Status: AC | PRN
  Administered 2012-02-19: 1 mg via INTRAVENOUS
  Filled 2012-02-19: qty 1

## 2012-02-19 MED ORDER — FOLIC ACID 1 MG PO TABS
1.0000 mg | ORAL_TABLET | Freq: Every day | ORAL | Status: DC
  Administered 2012-02-20 – 2012-02-23 (×4): 1 mg via ORAL
  Filled 2012-02-19 (×5): qty 1

## 2012-02-19 MED ORDER — THIAMINE HCL 100 MG/ML IJ SOLN
100.0000 mg | Freq: Every day | INTRAMUSCULAR | Status: DC
  Administered 2012-02-19: 100 mg via INTRAVENOUS
  Filled 2012-02-19: qty 1

## 2012-02-19 MED ORDER — POTASSIUM CHLORIDE IN NACL 20-0.9 MEQ/L-% IV SOLN
INTRAVENOUS | Status: DC
  Administered 2012-02-19 – 2012-02-20 (×3): via INTRAVENOUS
  Filled 2012-02-19 (×5): qty 1000

## 2012-02-19 MED ORDER — VITAMIN B-1 100 MG PO TABS
100.0000 mg | ORAL_TABLET | Freq: Every day | ORAL | Status: DC
  Administered 2012-02-20 – 2012-02-23 (×4): 100 mg via ORAL
  Filled 2012-02-19 (×5): qty 1

## 2012-02-19 MED ORDER — SODIUM CHLORIDE 0.9 % IV SOLN
1000.0000 mL | INTRAVENOUS | Status: DC
  Administered 2012-02-19: 1000 mL via INTRAVENOUS

## 2012-02-19 MED ORDER — LORAZEPAM 1 MG PO TABS
1.0000 mg | ORAL_TABLET | Freq: Four times a day (QID) | ORAL | Status: AC | PRN
  Administered 2012-02-21 (×2): 1 mg via ORAL
  Filled 2012-02-19 (×2): qty 1

## 2012-02-19 MED ORDER — THIAMINE HCL 100 MG/ML IJ SOLN
100.0000 mg | Freq: Every day | INTRAMUSCULAR | Status: DC
  Filled 2012-02-19 (×5): qty 1

## 2012-02-19 MED ORDER — SODIUM CHLORIDE 0.9 % IV SOLN
INTRAVENOUS | Status: AC
  Administered 2012-02-19: 10:00:00 via INTRAVENOUS

## 2012-02-19 MED ORDER — POTASSIUM CHLORIDE 10 MEQ/100ML IV SOLN
10.0000 meq | INTRAVENOUS | Status: AC
  Administered 2012-02-19 (×3): 10 meq via INTRAVENOUS
  Filled 2012-02-19 (×3): qty 100

## 2012-02-19 NOTE — H&P (Signed)
Hospital Admission Note Date: 02/19/2012  Patient name: Diane Gilbert Medical record number: [id number] Date of birth: 05-23-1966 Age: 46 y.o. Gender: female PCP: DEFAULT,PROVIDER, MD  Medical Service: Internal Medicine Teaching Service  Attending physician:  Dr. Lynnae January    1st Contact:  Dr. Sissy Hoff   Pager: [phone] 2nd Contact:  Dr. Leonia Reeves   [phone] After 5 pm or weekends: 1st Contact:      Pager: [phone] 2nd Contact:      Pager: [phone]  Chief Complaint: abdominal pain, N/V, diarrhea, cough  History of Present Illness: Diane Gilbert is a 46 yo woman with PMH of alcohol abuse who drinks about 4-6 of 40 ounces of beer per day presents to the ED for complaints of abdominal pain, back pain, N/V, fever, cough x 2-3 weeks in duration. Patient reported that her abdominal pain is constant,  sore and sharp in quality, 9/10 in severity, locating at the right and left upper quadrant without any radiation.  Her abdominal pain is not associated with food intake and no alleviation factor. Patient also has subjective fever at home in the past 2 weeks with chills. She also has unintentional weight loss do to her inability to keep down her food. She also has nausea and vomiting about 3-4 times a day which is nonbloody and nonbilious. She also reported having watery diarrhea that is nonbloody in the past 2 weeks and she has about 4-5 episodes per day. She denies any recent travel or eating seafood. Patient denies any dysuria, burning sensation, vaginal discharge or malodor. Her last menstrual period was 01/28/2012. Her last sexual activity was about 3 weeks ago with her female partner. She reports only have one partner in the past 3 months. Her last alcoholic drink was one day prior to admission which she drank about 24 ounces to help her go to sleep. She denies any chest pain or shortness of breath. She denies any similar episodes in the past and states that this is her first time having abdominal pain  and diarrhea.   Meds: None  Allergies: Allergies as of 02/19/2012  . (No Known Allergies)   Past Medical History  Diagnosis Date  . Alcohol abuse   . Hypertension   . Insomnia    Past Surgical History  Procedure Date  . No past surgeries    Family History: mother has cancer but patient does not know exact type   History   Social History  . Marital Status: Single    Spouse Name: N/A    Number of Children: N/A  . Years of Education: N/A   Occupational History  . Not on file.   Social History Main Topics  . Smoking status: Current Some Day Smoker -- 0.5 packs/day x 20 years    Types: Cigarettes  . Smokeless tobacco: Not on file  . Alcohol Use: Yes     patient drinks 5-6 40oz beers a day since age of 58  . Drug Use: No  . 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 She states that she did cut a college but did not finish because her brother passed away.  Review of Systems: Constitutional: +fever, chills, diaphoresis, appetite change and fatigue.  HEENT: Denies photophobia, eye pain, redness, hearing loss, ear pain, congestion, sore throat, rhinorrhea, sneezing, mouth sores, trouble swallowing, neck pain, neck stiffness  and tinnitus.  Respiratory: Denies SOB, DOE, +cough, chest tightness, and wheezing.  Cardiovascular: Denies chest pain, palpitations and leg swelling.  Gastrointestinal: + nausea,+ vomiting, +abdominal pain, +diarrhea, constipation, blood in stool and abdominal distention.  Genitourinary: Denies dysuria, urgency, frequency, hematuria, +flank pain and difficulty urinating.  Musculoskeletal: Denies myalgias, +back pain, joint swelling, arthralgias and gait problem.  Skin: Denies pallor, rash and wound.  Neurological: Denies dizziness, seizures, syncope, weakness, light-headedness, numbness and headaches.  Hematological:  Denies adenopathy. Easy bruising, personal or family bleeding history  Psychiatric/Behavioral: Denies suicidal ideation, mood changes, confusion, nervousness, sleep disturbance and agitation  Physical Exam: Blood pressure 124/70, pulse 107, temperature 100.1 F (37.8 C), temperature source Oral, resp. rate 31, last menstrual period 10/05/2011, SpO2 94.00%. General: alert, well-developed, and cooperative to examination.  Head: normocephalic and atraumatic.  Eyes: vision grossly intact, pupils equal, pupils round, pupils reactive to light, no injection and anicteric.  Mouth: pharynx pink and moist, no erythema, and no exudates.  Neck: supple, full ROM, no thyromegaly, no JVD, and no carotid bruits.  Lungs: normal respiratory effort, no accessory muscle use, normal breath sounds, no crackles, and no wheezes. Heart: tachycardic, regular rhythm, no murmur, no gallop, and no rub.  Abdomen: soft, + tender to palpation of the right upper quadrant and left upper quadrant, + Murphy's sign and +CVA tenderness bilaterally, hypoactive bowel sounds, no distention, no guarding, no rebound tenderness Msk: no joint swelling, no joint warmth, and no redness over joints.  Pulses: 2+ DP/PT pulses bilaterally Extremities: No cyanosis, clubbing, edema Neurologic: alert & oriented X3, cranial nerves II-XII intact, strength normal in all extremities, sensation intact to light touch Skin: turgor normal and no rashes.  Psych: Oriented X3, memory intact for recent and remote, normally interactive, good eye contact, not anxious appearing, and not depressed appearing.  Lab results: Basic Metabolic Panel:  [facility] 02/19/12 0520  NA 131*  K 2.8*  CL 92*  CO2 22  GLUCOSE 138*  BUN 14  CREATININE 1.19*  CALCIUM 9.2  MG --  PHOS --   Liver Function Tests:  [facility] 02/19/12 0520  AST 50*  ALT 34  ALKPHOS 195*  BILITOT 1.3*  PROT 8.4*  ALBUMIN 2.9*    Basename 02/19/12 0520  LIPASE 49  AMYLASE --    CBC:  Basename 02/19/12 0520  WBC 18.7*  NEUTROABS 14.2*  HGB 10.9*  HCT 32.5*  MCV 86.0  PLT 185    Alcohol Level:  Basename 02/19/12 0520  ETH <11   Urinalysis:  Basename 02/19/12 0548  COLORURINE AMBER*  LABSPEC 1.014  PHURINE 5.5  GLUCOSEU NEGATIVE  HGBUR LARGE*  BILIRUBINUR SMALL*  KETONESUR NEGATIVE  PROTEINUR 100*  UROBILINOGEN 1.0  NITRITE POSITIVE*  LEUKOCYTESUR MODERATE*    Imaging results:  Dg Chest Portable 1 View  02/19/2012  *RADIOLOGY REPORT*  Clinical Data: Cough and chest pain.  PORTABLE CHEST - 1 VIEW  Comparison: 03/11/2011  Findings: 0738 hours. The lungs are clear without focal infiltrate, edema, pneumothorax or pleural effusion. Cardiopericardial silhouette is at upper limits of normal for size. Imaged bony structures of the thorax are intact. Telemetry leads overlie the chest.  IMPRESSION: Upper normal heart size.  No acute cardiopulmonary findings.   Original Report Authenticated By: ERIC A. MANSELL, M.D.     Other results: EKG: none  Assessment & Plan by Problem:  1. Abdominal pain: Differential dx include: acute pancreatitis given her alcohol abuse history, PID, UTI/pyelonephritis or cholecystitis. Lipase is normal and physical exam is  not consistent with pancreatitis, therefore, less likely.  Cholecystitis is high on the differential because of her hx of alcohol and right upper quadrant pain, elevated alk phos of 195 and a positive Murphy's sign on physical exam.  PID can also be a component because she does have trichomonas on urinalysis as well as nitrites and leukocytes.  Patient received 1 dose of Rocephin in the ED and 2L of NS. -Will admit to telemetry -Get stat abdominal ultrasound to evaluate her pancreas and gallbladder -Check urine GC/Chlamydia, HIV, hepatitis panel -Keep NPO for now -Pain control with IV dilaudid 0.46m q3hrs -Zofran for N/V -IVF with NS with KCl @ 125cc/hr -IV Rocephin for UTI  2. UTI: Urinalysis shows  nitrites and leukocytes, plus many trichomonas. WBC is too numerous to count, however, many squamous as well. -Will treat with Rocephin IV -Send for urine culture and will need to narrow abx once sensitivities are back -Treat trichomonas with Flagyl 2g PO x1 dose, I informed patient that her partner will need to be treated as well. -Will also check for urine GC/Chlamydia   3. Hyponatremia: Na is 131, likely hypovolemia hyponatremia.  Will replete with NS IVF and follow BMP  4. Hypokalemia: K=2.8, likely due to poor oral intake.  Will check magnesium level.  Replete with KCl and repeat BMP at 1PM  5. Leukocytosis: WBC is 18.7, likely 2/2 demargination as well as UTI.  Will treat with Rocephin and monitor WBC.  6. Acute kidney injury: mild AKI with Cr 1.19 today.  Baseline Cr 0.7.  Likely due to pre-renal from volume depletion. Will resuscitate with IVF and monitor BMP. -Will check FENa with Urine Na and Cr  7. Anemia: Hb is 10.9, baseline Hb is 13.  Will check anemia panel and FOBT, check orthostatic vitals. She denies any melena/tarry stool. Will follow her CBC in AM  8. Elevated Alk phos: level at 195, will check fractionated alk phos. Differential dx include biliary obstruction vs bone etiology.  9. Alcohol Withdrawal: patient is a heavy drinker, 4-6 of 40 ounces of beer per day since the age of 89 No sign of withdrawal at this time. Alcohol level is <11.  -Will start CIWA protocol -Give Thiamine and Folate IV -Alcohol cessation counseling -Telemetry monitor  10. Hematuria: likely 2/2 current UTI, will need to repeat as outpatient in several weeks  11. Diarrhea: patient reports 4-5 episodes of watery diarrhea per day in the past 2 weeks.  Unclear etiology at this time, ddx microscopic colitis vs celiac disease.  Patient denies any recent travel or eating seafood. Will check Osm Gap by checking stool Na and K.  Osm gap = 290 - 2(stool Na+ stool K), if gap is >100, this indicates an  osmotic cause of her diarrhea.  Will also check C diff difficile, stool cultures, and HIV. -Will give Loperamide PRN  DVT ppx: heparin SQ  Signed: Katiejo Gilroy 02/19/2012, 8:05 AM

## 2012-02-19 NOTE — ED Notes (Signed)
Pt ambulated to restroom without assistance.

## 2012-02-19 NOTE — ED Provider Notes (Signed)
History     CSN: [id number]  Arrival date & time 02/19/12  0445   First MD Initiated Contact with Patient 02/19/12 0536      Chief Complaint  Patient presents with  . Emesis  . Diarrhea    (Consider location/radiation/quality/duration/timing/severity/associated sxs/prior treatment) HPI  Patient reports she has been a heavy alcohol drinker drinking as many as six 40 ounces of beer a day. She reports last week however she started feeling hot and then started having some diarrhea and vomiting. She reports she is having about 4 watery stools a day and she's vomiting about 3 times a day. She also states she has some diffuse upper abdominal pain t and pain from her neck all the way down her back. She states the pain is a burning pain. She states she feels dizzy and lightheaded especially on standing. She states she's been  unable to drink anything. She states she has quit drinking since last week. Because she states alcohol makes the pain feel worse as does food. Patient states she's never had this before.  PCP none  Past Medical History  Diagnosis Date  . Alcohol abuse   . Hypertension   . Insomnia     Past Surgical History  Procedure Date  . No past surgeries     History reviewed. No pertinent family history.  History  Substance Use Topics  . Smoking status: Current Some Day Smoker -- 0.5 packs/day    Types: Cigarettes  . Smokeless tobacco: Not on file  . Alcohol Use: Yes     patient drinks 5-6 40oz beers a day  unemployed Has boyfriend support her  OB History    Grav Para Term Preterm Abortions TAB SAB Ect Mult Living   _0      Review of Systems  All other systems reviewed and are negative.    Allergies  Review of patient's allergies indicates no known allergies.  Home Medications  No current outpatient prescriptions on file.  BP 124/70  Pulse 107  Temp 100.1 F (37.8 C) (Oral)  Resp 31  SpO2 94%  LMP 10/05/2011  Vital signs normal  except tachycardia and low-grade fever   Physical Exam  Nursing note and vitals reviewed. Constitutional: She is oriented to person, place, and time. She appears well-developed and well-nourished.  Non-toxic appearance. She does not appear ill. No distress.       Sunburnt cheeks on her face  HENT:  Head: Normocephalic and atraumatic.  Right Ear: External ear normal.  Left Ear: External ear normal.  Nose: Nose normal. No mucosal edema or rhinorrhea.  Mouth/Throat: Mucous membranes are normal. No dental abscesses or uvula swelling.       Tongue dry  Eyes: Conjunctivae and EOM are normal. Pupils are equal, round, and reactive to light.  Neck: Normal range of motion and full passive range of motion without pain. Neck supple.  Cardiovascular: Normal rate, regular rhythm and normal heart sounds.  Exam reveals no gallop and no friction rub.   No murmur heard. Pulmonary/Chest: Effort normal and breath sounds normal. No respiratory distress. She has no wheezes. She has no rhonchi. She has no rales. She exhibits no tenderness and no crepitus.  Abdominal: Soft. Normal appearance and bowel sounds are normal. She exhibits no distension. There is tenderness. There is guarding. There is no rebound.    Musculoskeletal: Normal range of motion. She exhibits no edema and no tenderness.  Moves all extremities well.   Neurological: She is alert and oriented to person, place, and time. She has normal strength. No cranial nerve deficit.  Skin: Skin is warm, dry and intact. No rash noted. No erythema. No pallor.  Psychiatric: She has a normal mood and affect. Her speech is normal and behavior is normal. Her mood appears not anxious.    ED Course  Procedures (including critical care time)   Medications  0.9 %  sodium chloride infusion (1000 mL Intravenous New Bag/Given 02/19/12 0630)    Followed by  0.9 %  sodium chloride infusion (1000 mL Intravenous New Bag/Given 02/19/12 0630)    Followed by  0.9  %  sodium chloride infusion (1000 mL Intravenous New Bag/Given 02/19/12 0715)  cefTRIAXone (ROCEPHIN) 1 g in dextrose 5 % 50 mL IVPB (1 g Intravenous Given 02/19/12 0716)  potassium chloride 10 mEq in 100 mL IVPB (10 mEq Intravenous Given 02/19/12 0737)  ondansetron (ZOFRAN) injection 4 mg (4 mg Intravenous Given 02/19/12 0522)  metoCLOPramide (REGLAN) injection 10 mg (10 mg Intravenous Given 02/19/12 0631)  diphenhydrAMINE (BENADRYL) injection 25 mg (25 mg Intravenous Given 02/19/12 0630)  morphine 4 MG/ML injection 4 mg (4 mg Intravenous Given 02/19/12 0715)   08:00 Dr Silverio Decamp, [facility] accepts for admission to tele for Dr Lynnae January, wants AP CT without contrast.    Results for orders placed during the hospital encounter of 02/19/12  CBC WITH DIFFERENTIAL      Component Value Range   WBC 18.7 (*) 4.0 - 10.5 K/uL   RBC 3.78 (*) 3.87 - 5.11 MIL/uL   Hemoglobin 10.9 (*) 12.0 - 15.0 g/dL   HCT 32.5 (*) 36.0 - 46.0 %   MCV 86.0  78.0 - 100.0 fL   MCH 28.8  26.0 - 34.0 pg   MCHC 33.5  30.0 - 36.0 g/dL   RDW 13.0  11.5 - 15.5 %   Platelets 185  150 - 400 K/uL   Neutrophils Relative 76  43 - 77 %   Neutro Abs 14.2 (*) 1.7 - 7.7 K/uL   Lymphocytes Relative 8 (*) 12 - 46 %   Lymphs Abs 1.6  0.7 - 4.0 K/uL   Monocytes Relative 16 (*) 3 - 12 %   Monocytes Absolute 2.9 (*) 0.1 - 1.0 K/uL   Eosinophils Relative 0  0 - 5 %   Eosinophils Absolute 0.0  0.0 - 0.7 K/uL   Basophils Relative 0  0 - 1 %   Basophils Absolute 0.0  0.0 - 0.1 K/uL  BASIC METABOLIC PANEL      Component Value Range   Sodium 131 (*) 135 - 145 mEq/L   Potassium 2.8 (*) 3.5 - 5.1 mEq/L   Chloride 92 (*) 96 - 112 mEq/L   CO2 22  19 - 32 mEq/L   Glucose, Bld 138 (*) 70 - 99 mg/dL   BUN 14  6 - 23 mg/dL   Creatinine, Ser 1.19 (*) 0.50 - 1.10 mg/dL   Calcium 9.2  8.4 - 10.5 mg/dL   GFR calc non Af Amer 55 (*) >90 mL/min   GFR calc Af Amer 64 (*) >90 mL/min  URINALYSIS, ROUTINE W REFLEX MICROSCOPIC      Component Value Range   Color,  Urine AMBER (*) YELLOW   APPearance TURBID (*) CLEAR   Specific Gravity, Urine 1.014  1.005 - 1.030   pH 5.5  5.0 - 8.0   Glucose, UA NEGATIVE  NEGATIVE mg/dL   Hgb  urine dipstick LARGE (*) NEGATIVE   Bilirubin Urine SMALL (*) NEGATIVE   Ketones, ur NEGATIVE  NEGATIVE mg/dL   Protein, ur 100 (*) NEGATIVE mg/dL   Urobilinogen, UA 1.0  0.0 - 1.0 mg/dL   Nitrite POSITIVE (*) NEGATIVE   Leukocytes, UA MODERATE (*) NEGATIVE  HEPATIC FUNCTION PANEL      Component Value Range   Total Protein 8.4 (*) 6.0 - 8.3 g/dL   Albumin 2.9 (*) 3.5 - 5.2 g/dL   AST 50 (*) 0 - 37 U/L   ALT 34  0 - 35 U/L   Alkaline Phosphatase 195 (*) 39 - 117 U/L   Total Bilirubin 1.3 (*) 0.3 - 1.2 mg/dL   Bilirubin, Direct 0.5 (*) 0.0 - 0.3 mg/dL   Indirect Bilirubin 0.8  0.3 - 0.9 mg/dL  LIPASE, BLOOD      Component Value Range   Lipase 49  11 - 59 U/L  ETHANOL      Component Value Range   Alcohol, Ethyl (B) <11  0 - 11 mg/dL  URINE MICROSCOPIC-ADD ON      Component Value Range   Squamous Epithelial / LPF MANY (*) RARE   WBC, UA TOO NUMEROUS TO COUNT  <3 WBC/hpf   RBC / HPF 7-10  <3 RBC/hpf   Bacteria, UA MANY (*) RARE   Urine-Other MANY TRICHOMONAS      Laboratory interpretation all normal except UTI, hyponatremia, hypokalemia, low chloride, new renal insufficiency, leukocytosis, anemia       Dg Chest Portable 1 View  02/19/2012  *RADIOLOGY REPORT*  Clinical Data: Cough and chest pain.  PORTABLE CHEST - 1 VIEW  Comparison: 03/11/2011  Findings: 0738 hours. The lungs are clear without focal infiltrate, edema, pneumothorax or pleural effusion. Cardiopericardial silhouette is at upper limits of normal for size. Imaged bony structures of the thorax are intact. Telemetry leads overlie the chest.  IMPRESSION: Upper normal heart size.  No acute cardiopulmonary findings.   Original Report Authenticated By: ERIC A. MANSELL, M.D.      1. Pyelonephritis   2. Nausea vomiting and diarrhea   3. Dehydration   4.  Alcohol abuse   5. Hyponatremia   6. Chloride, decreased level   7. Renal insufficiency     Plan admission  Rolland Porter, MD, [place], MD 02/19/12 [phone]

## 2012-02-19 NOTE — ED Notes (Signed)
Per EMS, patient having flu like symptoms for about a week (nausea, vomiting, diarrhea, and body aches/pains).  Patient actively vomiting upon EMS arrival.  Patient states that she has been unable to keep anything down for the past week.

## 2012-02-19 NOTE — ED Notes (Signed)
Admitting MD at bedside.

## 2012-02-19 NOTE — ED Notes (Signed)
Called report to [phone]

## 2012-02-19 NOTE — Progress Notes (Signed)
ANTIBIOTIC CONSULT NOTE - INITIAL  Pharmacy Consult for Rocephin Indication: Empiric pyelonephritis coverage  No Known Allergies  Patient Measurements:   Wt: 72.2 kg  Vital Signs: Temp: 100.6 F (38.1 C) (08/23 0934) Temp src: Oral (08/23 0934) BP: 126/82 mmHg (08/23 0934) Pulse Rate: 105  (08/23 0934) Intake/Output from previous day:   Intake/Output from this shift:    Labs:  [facility] 02/19/12 0520  WBC 18.7*  HGB 10.9*  PLT 185  LABCREA --  CREATININE 1.19*   The CrCl is unknown because both a height and weight (above a minimum accepted value) are required for this calculation. No results found for this basename: VANCOTROUGH:2,VANCOPEAK:2,VANCORANDOM:2,GENTTROUGH:2,GENTPEAK:2,GENTRANDOM:2,TOBRATROUGH:2,TOBRAPEAK:2,TOBRARND:2,AMIKACINPEAK:2,AMIKACINTROU:2,AMIKACIN:2, in the last 72 hours   Microbiology: No results found for this or any previous visit (from the past 720 hour(s)).  Medical History: Past Medical History  Diagnosis Date  . Alcohol abuse   . Hypertension   . Insomnia     Assessment: 46 y.o. F with history of EtOH abuse who presented to the [facility] with emesis, diffuse abdominal pain, and diarrhea. Pharmacy has been consulted to start Rocephin for empiric pyelonephritis coverage. The patient already received 1 dose of Rocephin around 0700 this morning. Wt: 72.2 kg, SCr 1.19, CrCl~70-80 ml/min.   Goal of Therapy:  Eradication of Infection  Plan:  1. Rocephin 1g IV every 24 hours 2. Pharmacy will sign off of protocol as no adjustments required for renal function.   Alycia Rossetti, PharmD, BCPS Clinical Pharmacist Pager: [phone] 02/19/2012 10:07 AM

## 2012-02-19 NOTE — ED Notes (Signed)
Pt reports n/v/d, fever, and cough x1 week, also c/o generalized abd pain and back pain - pt vomiting on arrival to room - pt on cardiac monitor, ST. Pt A&Ox4

## 2012-02-20 DIAGNOSIS — R197 Diarrhea, unspecified: Secondary | ICD-10-CM

## 2012-02-20 DIAGNOSIS — R509 Fever, unspecified: Secondary | ICD-10-CM

## 2012-02-20 LAB — GC PROBE AMPLIFICATION, URINE: GC Probe Amp, Urine: NEGATIVE

## 2012-02-20 LAB — COMPREHENSIVE METABOLIC PANEL
ALT: 31 U/L (ref 0–35)
Albumin: 2.4 g/dL — ABNORMAL LOW (ref 3.5–5.2)
Calcium: 7.9 mg/dL — ABNORMAL LOW (ref 8.4–10.5)
GFR calc Af Amer: 86 mL/min — ABNORMAL LOW (ref >90)
Glucose, Bld: 82 mg/dL (ref 70–99)
Sodium: 140 mEq/L (ref 135–145)
Total Protein: 7.1 g/dL (ref 6.0–8.3)

## 2012-02-20 LAB — CBC
HCT: 28.5 % — ABNORMAL LOW (ref 36.0–46.0)
Hemoglobin: 9.1 g/dL — ABNORMAL LOW (ref 12.0–15.0)
MCHC: 31.9 g/dL (ref 30.0–36.0)
MCV: 89.9 fL (ref 78.0–100.0)

## 2012-02-20 LAB — INFLUENZA PANEL BY PCR (TYPE A & B)
H1N1 flu by pcr: NOT DETECTED
Influenza B By PCR: NEGATIVE

## 2012-02-20 LAB — FERRITIN: Ferritin: 599 ng/mL — ABNORMAL HIGH (ref 10–291)

## 2012-02-20 LAB — IRON AND TIBC: UIBC: 150 ug/dL (ref 125–400)

## 2012-02-20 LAB — HIV ANTIBODY (ROUTINE TESTING W REFLEX): HIV: NONREACTIVE

## 2012-02-20 MED ORDER — LORAZEPAM 1 MG PO TABS: 0.0000 mg | ORAL_TABLET | Freq: Four times a day (QID) | ORAL | Status: AC

## 2012-02-20 MED ORDER — BOOST / RESOURCE BREEZE PO LIQD
237.0000 mL | Freq: Three times a day (TID) | ORAL | Status: DC
  Administered 2012-02-20 – 2012-02-23 (×8): 1 via ORAL

## 2012-02-20 MED ORDER — LORAZEPAM 1 MG PO TABS
0.0000 mg | ORAL_TABLET | Freq: Two times a day (BID) | ORAL | Status: DC
  Administered 2012-02-22: 1 mg via ORAL
  Filled 2012-02-20: qty 1

## 2012-02-20 MED ORDER — PANTOPRAZOLE SODIUM 20 MG PO TBEC
20.0000 mg | DELAYED_RELEASE_TABLET | Freq: Two times a day (BID) | ORAL | Status: DC
  Administered 2012-02-20 – 2012-02-23 (×6): 20 mg via ORAL
  Filled 2012-02-20 (×8): qty 1

## 2012-02-20 MED ORDER — KCL IN DEXTROSE-NACL 20-5-0.45 MEQ/L-%-% IV SOLN
INTRAVENOUS | Status: DC
  Administered 2012-02-20 – 2012-02-21 (×3): via INTRAVENOUS
  Filled 2012-02-20 (×6): qty 1000

## 2012-02-20 NOTE — Progress Notes (Signed)
INITIAL ADULT NUTRITION ASSESSMENT Date: 02/20/2012   Time: 1:44 PM Reason for Assessment: Malnutrition Risk Screen/weight loss  INTERVENTION: C/L diet, advance as [employer] TID  ASSESSMENT: Female 46 y.o.  Dx: Patient Active Problem List  Diagnosis  . Alcohol abuse  . Dehydration  . Hyponatremia  . Nausea vomiting and diarrhea  . Pyelonephritis  . Renal insufficiency  . Abdominal pain  . Hypokalemia  . Leukocytosis  . Anemia  . Trichomoniasis   Hx:  Past Medical History  Diagnosis Date  . Alcohol abuse   . Hypertension   . Insomnia    Related Meds:     . cefTRIAXone (ROCEPHIN)  IV  1 g Intravenous Q24H  . folic acid  1 mg Oral Daily  . heparin  5,000 Units Subcutaneous Q8H  . LORazepam  0-4 mg Oral Q6H   Followed by  . LORazepam  0-4 mg Oral Q12H  . metroNIDAZOLE  2,000 mg Oral Once  . multivitamin with minerals  1 tablet Oral Daily  . pantoprazole  20 mg Oral BID AC  . potassium chloride  10 mEq Intravenous Q1 Hr x 4  . thiamine  100 mg Oral Daily   Or  . thiamine  100 mg Intravenous Daily    Ht: _0  (162.6 cm)  Wt: 148 lb 4.8 oz (67.268 kg)  Ideal Wt: 54.7 kg % Ideal Wt: 123%  Usual Wt: 176 Lbs % Usual Wt: 84%  Body mass index is 25.46 kg/(m^2).  Food/Nutrition Related Hx: reports a 28 Lb weight loss over 3 weeks due to nausea and diarrhea  Labs:  CMP     Component Value Date/Time   NA 140 02/20/2012 0540   K 3.7 02/20/2012 0540   CL 106 02/20/2012 0540   CO2 19 02/20/2012 0540   GLUCOSE 82 02/20/2012 0540   BUN 10 02/20/2012 0540   CREATININE 0.93 02/20/2012 0540   CALCIUM 7.9* 02/20/2012 0540   PROT 7.1 02/20/2012 0540   ALBUMIN 2.4* 02/20/2012 0540   AST 65* 02/20/2012 0540   ALT 31 02/20/2012 0540   ALKPHOS 151* 02/20/2012 0540   BILITOT 0.7 02/20/2012 0540   GFRNONAA 74* 02/20/2012 0540   GFRAA 86* 02/20/2012 0540     Intake:   Intake/Output Summary (Last 24 hours) at 02/20/12 1347 Last data filed at 02/20/12 0950  Gross  per 24 hour  Intake   3257 ml  Output    400 ml  Net   2857 ml   Output:   Diet Order: Clear Liquid Is currently tolerating C/L diet Supplements/Tube Feeding:  IVF:    sodium chloride Last Rate: 125 mL/hr at 02/19/12 1014  dextrose 5 % and 0.45 % NaCl with KCl 20 mEq/L Last Rate: 75 mL/hr at 02/20/12 1308  DISCONTD: sodium chloride Last Rate: Stopped (02/19/12 1900)  DISCONTD: 0.9 % NaCl with KCl 20 mEq / L Last Rate: 125 mL/hr at 02/20/12 0439    Estimated Nutritional Needs:   Kcal: 1600-1800 Protein: 73-83 g Fluid: 1.8 L  NUTRITION DIAGNOSIS: -Inadequate oral intake (NI-2.1).  Status: Ongoing  RELATED TO: nausea/diarrhea  AS EVIDENCE BY: 28 Lb weight loss  MONITORING/EVALUATION(Goals): Tolerance and advancement of PO diet, to allow to meet 90 % of estimated needs  EDUCATION NEEDS: -No education needs identified at this time    [place] Per approved criteria  -Severe malnutrition in the context of acute illness or injury  Pt with a > 5 % loss of usual weight, po  intake </= 50 % of estimated needs for > 5 days  Olufemi Mofield,KATHY 02/20/2012, 1:44 PM

## 2012-02-20 NOTE — H&P (Signed)
IM Attending on-call  48 woman with severe alcoholism.  Adm for 2 weeks of nausea, vomiting and diarrhea.  Found to have UTI and mild evidence of volume contraction.  Vomiting has stopped.  Bmet is improving with IVF.  Liver is OK except alk phos of 195.  Imaging does not show serious GB disease. Only critical issue is alcoholism.  She weakly indicates intention to enter recovery program.

## 2012-02-20 NOTE — Progress Notes (Signed)
Subjective:  Patient is feeling better this morning. Still having abdominal pain. No vomiting, 2 episodes of watery diarrhea this morning. Yellow in color, no blood. Had fevers yesterday and overnight.    Objective: Vital signs in last 24 hours: Filed Vitals:   02/19/12 2045 02/19/12 2229 02/20/12 0454 02/20/12 1000  BP:  130/84 146/93 151/101  Pulse:  106 102 93  Temp:  99.4 F (37.4 C) 99.9 F (37.7 C) 97.8 F (36.6 C)  TempSrc:  Oral Oral Oral  Resp:  _0 Height: _1  (1.626 m)     Weight: 148 lb 4.8 oz (67.268 kg)     SpO2:  93% 95% 92%   Weight change:   Intake/Output Summary (Last 24 hours) at 02/20/12 1031 Last data filed at 02/20/12 0950  Gross per 24 hour  Intake   3257 ml  Output    400 ml  Net   2857 ml    Physical Exam Blood pressure 151/101, pulse 93, temperature 97.8 F (36.6 C), temperature source Oral, resp. rate 20, height _2  (1.626 m), weight 148 lb 4.8 oz (67.268 kg), last menstrual period 10/05/2011, SpO2 92.00%. General:  No acute distress, alert and oriented x 3, well-appearing  HEENT:  PERRL, EOMI, no lymphadenopathy, moist mucous membranes, anicteric Cardiovascular:  Regular rate and rhythm, no murmurs, rubs or gallops Respiratory:  CTAB, no wheezes, rales, or rhonchi Abdomen:  Soft, nondistended, tender to palpation in epigastric region, normoactive bowel sounds Extremities:  Warm and well-perfused, no clubbing, cyanosis, or edema.  Skin: Warm, dry, no rashes Neuro: Not anxious appearing, no depressed mood, normal affect  Lab Results: CBC    Component Value Date/Time   WBC 13.5* 02/20/2012 0540   RBC 3.17* 02/20/2012 0540   HGB 9.1* 02/20/2012 0540   HCT 28.5* 02/20/2012 0540   PLT 229 02/20/2012 0540   MCV 89.9 02/20/2012 0540   MCH 28.7 02/20/2012 0540   MCHC 31.9 02/20/2012 0540   RDW 13.4 02/20/2012 0540   LYMPHSABS 1.6 02/19/2012 0520   MONOABS 2.9* 02/19/2012 0520   EOSABS 0.0 02/19/2012 0520   BASOSABS 0.0 02/19/2012 0520     BMET    Component Value Date/Time   NA 140 02/20/2012 0540   K 3.7 02/20/2012 0540   CL 106 02/20/2012 0540   CO2 19 02/20/2012 0540   GLUCOSE 82 02/20/2012 0540   BUN 10 02/20/2012 0540   CREATININE 0.93 02/20/2012 0540   CALCIUM 7.9* 02/20/2012 0540   GFRNONAA 74* 02/20/2012 0540   GFRAA 86* 02/20/2012 0540     Micro Results: Recent Results (from the past 240 hour(s))  URINE CULTURE     Status: Normal (Preliminary result)   Collection Time   02/19/12  5:48 AM      Component Value Range Status Comment   Specimen Description URINE, CLEAN CATCH   Final    Special Requests ADDED AT 0653 ON [id number]   Final    Culture  Setup Time 02/19/2012 11:19   Final    Colony Count PENDING   Incomplete    Culture Culture reincubated for better growth   Final    Report Status PENDING   Incomplete   CLOSTRIDIUM DIFFICILE BY PCR     Status: Normal   Collection Time   02/19/12  1:22 PM      Component Value Range Status Comment   C difficile by pcr NEGATIVE  NEGATIVE Final     Studies/Results: US Abdomen Complete  02/19/2012  *  RADIOLOGY REPORT*  Clinical Data:  Right upper quadrant abdominal pain with CVA tenderness.  History of alcohol abuse.  Question cholecystitis.  COMPLETE ABDOMINAL ULTRASOUND  Comparison:  Abdominal pelvic CT 03/11/2011.  Findings:  Gallbladder: Well distended without wall thickening, stones or pericholecystic fluid. Minimal debris was noted within the gallbladder lumen.  Sonographic Murphy's sign is absent.  Common bile duct:   Normal in caliber without filling defects.  Liver:  Echogenicity is within normal limits.  No focal hepatic abnormalities are identified.  IVC:  Visualized portions appear unremarkable.  Pancreas:  Visualized portions appear unremarkable.  Spleen:  Visualized portions appear unremarkable.  Right Kidney:   The renal cortical thickness and echogenicity are preserved.  There is no hydronephrosis or focal abnormality. Renal length is 13.4 cm.  Left Kidney:   The  renal cortical thickness and echogenicity are preserved.  There is no hydronephrosis or focal abnormality. Renal length is 4.0 cm.  Abdominal aorta:  Visualized portions appear unremarkable. The distal aorta is obscured by bowel gas.  IMPRESSION: No acute findings.  No evidence of cholecystitis.   Original Report Authenticated By: Vivia Ewing, M.D.    Dg Chest Portable 1 View  02/19/2012  *RADIOLOGY REPORT*  Clinical Data: Cough and chest pain.  PORTABLE CHEST - 1 VIEW  Comparison: 03/11/2011  Findings: 0738 hours. The lungs are clear without focal infiltrate, edema, pneumothorax or pleural effusion. Cardiopericardial silhouette is at upper limits of normal for size. Imaged bony structures of the thorax are intact. Telemetry leads overlie the chest.  IMPRESSION: Upper normal heart size.  No acute cardiopulmonary findings.   Original Report Authenticated By: ERIC A. MANSELL, M.D.     Medications: medications reviewed Scheduled Meds:   . cefTRIAXone (ROCEPHIN)  IV  1 g Intravenous Q24H  . folic acid  1 mg Oral Daily  . heparin  5,000 Units Subcutaneous Q8H  . LORazepam  0-4 mg Oral Q6H   Followed by  . LORazepam  0-4 mg Oral Q12H  . metroNIDAZOLE  2,000 mg Oral Once  . multivitamin with minerals  1 tablet Oral Daily  . potassium chloride  10 mEq Intravenous Q1 Hr x 3  . potassium chloride  10 mEq Intravenous Q1 Hr x 4  . thiamine  100 mg Oral Daily   Or  . thiamine  100 mg Intravenous Daily  . DISCONTD: folic acid  1 mg Intravenous Daily  . DISCONTD: thiamine  100 mg Intravenous Daily   Continuous Infusions:   . sodium chloride 125 mL/hr at 02/19/12 1014  . dextrose 5 % and 0.45 % NaCl with KCl 20 mEq/L    . DISCONTD: sodium chloride Stopped (02/19/12 1900)  . DISCONTD: 0.9 % NaCl with KCl 20 mEq / L 125 mL/hr at 02/20/12 0439   PRN Meds:.acetaminophen, acetaminophen, loperamide, LORazepam, LORazepam, morphine injection, ondansetron (ZOFRAN) IV, ondansetron, DISCONTD:   HYDROmorphone (DILAUDID) injection  Assessment/Plan:  Diane Gilbert is a 46 year old female with a history of chronic alcoholism who presents with vomiting, abdominal pain and diarrhea for 2 weeks.  1. Abdominal pain, diarrhea, vomiting: Differential dx include: Alcoholic gastritis, acute gastroenteritis, alcoholic hepatitis, cholestasis. May be secondary to UTI.  Doubt pancreatitis given normal lipase. Doubt PID. Alk Phos is elevated but no stones seen in GB or bile duct, no ductal dilation on U/s, no pericholocystic fluid or wall thickening. GC/Chl negative, HIV neg, Hepatitis pending. Cdiff neg.  -advance diet to clears today -Zofran PRN for N/V  -IVF: NS with KCl @  75cc/hr  -IV Rocephin for UTI, switch to PO when able to tolerate -start PPI  UTI: Urinalysis shows nitrites and leukocytes, plus many trichomonas. WBC is too numerous to count, however, many squamous as well. Likely source of white count and fever.  -cont Rocephin IV, one time flagyl dose given for trichomonas -Send for urine culture and will need to narrow abx once sensitivities are back   Hyponatremia: Resolved  Hypokalemia: likely 2/2 vomiting/diarrhea -will monitor and replete as needed  Acute kidney injury: resolved. Cr .93 today, Baseline Cr 0.7. Likely due to pre-renal from volume depletion.   Anemia: Hb is 10.9, baseline Hb is 13. Will check anemia panel and FOBT, check orthostatic vitals. She denies any melena/tarry stool. Will follow her CBC in AM   Alcohol Withdrawal: patient is a heavy drinker, 4-6 of 40 ounces of beer per day since the age of 39. No sign of withdrawal at this time. Alcohol level is <11.  -cont CIWA protocol  -Give Thiamine and Folate IV  -Alcohol cessation counseling  -Telemetry monitor   DVT ppx: heparin SQ  Dispo -patient may be able to go home tomorrow if tolerating PO -f/u with [facility] for addiction counseling, as patient wants to quit drinking   LOS: 1 day   Diane Gilbert 02/20/2012, 10:31 AM

## 2012-02-21 LAB — CBC
HCT: 29.7 % — ABNORMAL LOW (ref 36.0–46.0)
Hemoglobin: 9.5 g/dL — ABNORMAL LOW (ref 12.0–15.0)
MCH: 28.4 pg (ref 26.0–34.0)
WBC: 12.5 10*3/uL — ABNORMAL HIGH (ref 4.0–10.5)

## 2012-02-21 LAB — BASIC METABOLIC PANEL
BUN: 3 mg/dL — ABNORMAL LOW (ref 6–23)
CO2: 20 mEq/L (ref 19–32)
Chloride: 101 mEq/L (ref 96–112)
GFR calc Af Amer: >90 mL/min (ref >90)
Glucose, Bld: 175 mg/dL — ABNORMAL HIGH (ref 70–99)
Potassium: 3.1 mEq/L — ABNORMAL LOW (ref 3.5–5.1)

## 2012-02-21 MED ORDER — WHITE PETROLATUM GEL
Status: AC
  Administered 2012-02-21: 11:00:00
  Filled 2012-02-21: qty 5

## 2012-02-21 MED ORDER — MAGNESIUM SULFATE 40 MG/ML IJ SOLN
2.0000 g | Freq: Once | INTRAMUSCULAR | Status: AC
  Administered 2012-02-21: 2 g via INTRAVENOUS
  Filled 2012-02-21: qty 50

## 2012-02-21 MED ORDER — POTASSIUM CHLORIDE 10 MEQ/100ML IV SOLN
10.0000 meq | INTRAVENOUS | Status: AC
Start: 2012-02-21 — End: 2012-02-21
  Administered 2012-02-21 (×4): 10 meq via INTRAVENOUS
  Filled 2012-02-21 (×4): qty 100

## 2012-02-21 NOTE — Progress Notes (Signed)
I was called by the nurse as patient reported having GI bleed. When I went to evaluate the patient , she reported episodes of bleeding per rectum with clots starting this AM. She denied any  episodes in the past or even this AM. She was not very helpful to me and did not want to talk about it .   She was not even happy with the idea of rectal exam but explaining her the necessity she agreed to it but was not very complaint." I would be very happy when I would leave here".  On rectal exam, she had some skin tags with some small skin breakdown , not big enough to explain clots.No active GI bleed.  DD forh er bleed include diverticular vs alcoholic gastritis vs PUD.  Plan: Stat CBC FOBT Transfuse PRBC if she has evidence of active GI  bleed.

## 2012-02-21 NOTE — Progress Notes (Signed)
Subjective:  She reports decreased abdominal pain but continues to have diarrhea. She moved her bowels four times yesterday - describes her stools as loose and watery. She had a fever with tmax of 100.9 yesterday.   Objective: Vital signs in last 24 hours: Filed Vitals:   02/20/12 1000 02/20/12 1344 02/20/12 1900 02/21/12 0444  BP: 151/101 137/84 149/84 144/87  Pulse: 93 95 101 97  Temp: 97.8 F (36.6 C) 98.8 F (37.1 C) 100.9 F (38.3 C) 99.8 F (37.7 C)  TempSrc: Oral Oral Oral Oral  Resp: _0 Height:      Weight:   155 lb 9.6 oz (70.58 kg)   SpO2: 92% 98% 95% 95%   Weight change: 7 lb 4.8 oz (3.311 kg)  Intake/Output Summary (Last 24 hours) at 02/21/12 0739 Last data filed at 02/20/12 2300  Gross per 24 hour  Intake   1300 ml  Output    800 ml  Net    500 ml    Physical Exam Blood pressure 144/87, pulse 97, temperature 99.8 F (37.7 C), temperature source Oral, resp. rate 18, height 5' 4" (1.626 m), weight 155 lb 9.6 oz (70.58 kg), last menstrual period 10/05/2011, SpO2 95.00%. General:  No acute distress, alert and oriented x 3, well-appearing  HEENT:  PERRL, EOMI, no lymphadenopathy, moist mucous membranes, anicteric Cardiovascular:  Regular rate and rhythm, no murmurs, rubs or gallops Respiratory:  CTAB, no wheezes, rales, or rhonchi Abdomen:  Soft, nondistended, tender to palpation in epigastric region, normoactive bowel sounds Extremities:  Warm and well-perfused, no clubbing, cyanosis, or edema.  Skin: Warm, dry, no rashes Neuro: Not anxious appearing, no depressed mood, normal affect  Lab Results: CBC    Component Value Date/Time   WBC 13.5* 02/20/2012 0540   RBC 3.17* 02/20/2012 0540   HGB 9.1* 02/20/2012 0540   HCT 28.5* 02/20/2012 0540   PLT 229 02/20/2012 0540   MCV 89.9 02/20/2012 0540   MCH 28.7 02/20/2012 0540   MCHC 31.9 02/20/2012 0540   RDW 13.4 02/20/2012 0540   LYMPHSABS 1.6 02/19/2012 0520   MONOABS 2.9* 02/19/2012 0520   EOSABS 0.0  02/19/2012 0520   BASOSABS 0.0 02/19/2012 0520    BMET    Component Value Date/Time   NA 140 02/20/2012 0540   K 3.7 02/20/2012 0540   CL 106 02/20/2012 0540   CO2 19 02/20/2012 0540   GLUCOSE 82 02/20/2012 0540   BUN 10 02/20/2012 0540   CREATININE 0.93 02/20/2012 0540   CALCIUM 7.9* 02/20/2012 0540   GFRNONAA 74* 02/20/2012 0540   GFRAA 86* 02/20/2012 0540     Micro Results: Recent Results (from the past 240 hour(s))  URINE CULTURE     Status: Normal (Preliminary result)   Collection Time   02/19/12  5:48 AM      Component Value Range Status Comment   Specimen Description URINE, CLEAN CATCH   Final    Special Requests ADDED AT 0653 ON [id number]   Final    Culture  Setup Time 02/19/2012 11:19   Final    Colony Count PENDING   Incomplete    Culture Culture reincubated for better growth   Final    Report Status PENDING   Incomplete   STOOL CULTURE     Status: Normal (Preliminary result)   Collection Time   02/19/12  1:22 PM      Component Value Range Status Comment   Specimen Description STOOL   Final  Special Requests NONE   Final    Culture Culture reincubated for better growth   Final    Report Status PENDING   Incomplete   CLOSTRIDIUM DIFFICILE BY PCR     Status: Normal   Collection Time   02/19/12  1:22 PM      Component Value Range Status Comment   C difficile by pcr NEGATIVE  NEGATIVE Final     Studies/Results: US Abdomen Complete  02/19/2012  *RADIOLOGY REPORT*  Clinical Data:  Right upper quadrant abdominal pain with CVA tenderness.  History of alcohol abuse.  Question cholecystitis.  COMPLETE ABDOMINAL ULTRASOUND  Comparison:  Abdominal pelvic CT 03/11/2011.  Findings:  Gallbladder: Well distended without wall thickening, stones or pericholecystic fluid. Minimal debris was noted within the gallbladder lumen.  Sonographic Murphy's sign is absent.  Common bile duct:   Normal in caliber without filling defects.  Liver:  Echogenicity is within normal limits.  No focal hepatic  abnormalities are identified.  IVC:  Visualized portions appear unremarkable.  Pancreas:  Visualized portions appear unremarkable.  Spleen:  Visualized portions appear unremarkable.  Right Kidney:   The renal cortical thickness and echogenicity are preserved.  There is no hydronephrosis or focal abnormality. Renal length is 13.4 cm.  Left Kidney:   The renal cortical thickness and echogenicity are preserved.  There is no hydronephrosis or focal abnormality. Renal length is 4.0 cm.  Abdominal aorta:  Visualized portions appear unremarkable. The distal aorta is obscured by bowel gas.  IMPRESSION: No acute findings.  No evidence of cholecystitis.   Original Report Authenticated By: Vivia Ewing, M.D.    Dg Chest Portable 1 View  02/19/2012  *RADIOLOGY REPORT*  Clinical Data: Cough and chest pain.  PORTABLE CHEST - 1 VIEW  Comparison: 03/11/2011  Findings: 0738 hours. The lungs are clear without focal infiltrate, edema, pneumothorax or pleural effusion. Cardiopericardial silhouette is at upper limits of normal for size. Imaged bony structures of the thorax are intact. Telemetry leads overlie the chest.  IMPRESSION: Upper normal heart size.  No acute cardiopulmonary findings.   Original Report Authenticated By: ERIC A. MANSELL, M.D.     Medications: medications reviewed Scheduled Meds:    . cefTRIAXone (ROCEPHIN)  IV  1 g Intravenous Q24H  . feeding supplement  237 mL Oral TID WC  . folic acid  1 mg Oral Daily  . heparin  5,000 Units Subcutaneous Q8H  . LORazepam  0-4 mg Oral Q6H   Followed by  . LORazepam  0-4 mg Oral Q12H  . multivitamin with minerals  1 tablet Oral Daily  . pantoprazole  20 mg Oral BID AC  . thiamine  100 mg Oral Daily   Or  . thiamine  100 mg Intravenous Daily   Continuous Infusions:    . dextrose 5 % and 0.45 % NaCl with KCl 20 mEq/L 75 mL/hr at 02/21/12 0028  . DISCONTD: sodium chloride Stopped (02/19/12 1900)  . DISCONTD: 0.9 % NaCl with KCl 20 mEq / L 125 mL/hr at  02/20/12 0439   PRN Meds:.acetaminophen, acetaminophen, loperamide, LORazepam, LORazepam, ondansetron (ZOFRAN) IV, ondansetron, DISCONTD:  HYDROmorphone (DILAUDID) injection  Assessment/Plan:  Ms. Underhill is a 46 year old female with a history of chronic alcoholism who presents with vomiting, abdominal pain and diarrhea for 2 weeks.  # Abdominal pain, diarrhea, vomiting: likely 2/2 gastroenteritis vs alcohol gastritis. She reports decreased abdominal pain but continues to have diarrhea. WBC trending down.   -advance diet as tolerated -Zofran PRN for  N/V  -IVF: D5 with KCl @ 75cc/hr  -start PPI  #Fever:  She continues to have fevers with Tmax of 100.9. Likely 2/2 viral gastroenteritis. Other possibility includes alcohol with drawl vs UTI.  If she continues to spike fevers, would obtain blood cultures but doubt the accuracy of results with being on antibiotics.   #UTI: She reports frequent urination . Urinalysis shows nitrites and leukocytes, plus many trichomonas. WBC is too numerous to count, however, many squamous as well. No evidence of hydronephrosis on ultrasound . Would treat her as acute uncomplicated cystitis with 3 days of antibiotics.   -cont Rocephin IV( day 3), one time flagyl dose given for trichomonas -Await urine cx  # Anion gap metabolic acidosis : likely 2/2 alcoholic ketoacidosis . His gap was 17  and was decreased to 23 yesterday .  - BMET pending for today  #Hypokalemia: likely 2/2 vomiting/diarrhea -will monitor and replete as needed  #Acute kidney injury: resolved. Cr .93 today, Baseline Cr 0.7. Likely due to pre-renal from volume depletion.   # Normocytic Anemia: She came with Hb of 13. Iron - 10 and ferritin of 599. Hb is 9.1, likely 2/2 hemodilution.  FOBT was negative. She denies any melena/tarry stool.  - She would benefit from iron supplemention at d/c.   #Alcohol Withdrawal: patient is a heavy drinker, 4-6 of 40 ounces of beer per day since the age of  65. No sign of withdrawal at this time. Alcohol level is <11. She has required only one dose of ativan so far since admission.  -cont CIWA protocol  -Give Thiamine and Folate IV  -Alcohol cessation counseling  -Telemetry monitor   #DVT ppx: heparin SQ  Dispo; pending further clinic improvement  -f/u with [facility] for addiction counseling, as patient wants to quit drinking   LOS: 2 days   Rosielee Corporan 02/21/2012, 7:39 AM

## 2012-02-21 NOTE — Progress Notes (Signed)
Pt complaining of bloody stools and occasional "blood clots" coming out when she tries to have a BM. Notified MD, and MD performed digital exam. Will obtain hemeoccult next time pt has a bowel movement.

## 2012-02-21 NOTE — Progress Notes (Signed)
Clinical Social Work Department BRIEF PSYCHOSOCIAL ASSESSMENT 02/21/2012  Diane Gilbert:  Diane Gilbert, Diane Gilbert     Account Number:  [id number]     Admit date:  02/19/2012  Clinical Social Worker:  Robbi Garter  Date/Time:  02/21/2012 04:20 PM  Referred by:  Physician  Date Referred:  02/20/2012 Referred for  Substance Abuse   Other Referral:   Interview type:  Diane Gilbert Other interview type:    PSYCHOSOCIAL DATA Living Status:  FAMILY Admitted from facility:   Level of care:   Primary support name:  Diane Gilbert Primary support relationship to Diane Gilbert:  SPOUSE Degree of support available:   Adequate, per pt    CURRENT CONCERNS Current Concerns  Substance Abuse   Other Concerns:    SOCIAL WORK ASSESSMENT / PLAN CSW met with re: current substance abuse. Pt admits to 5-6 "40s" of beer per day.  Pt denies illicit drug use.  Pt reports she plans on seeking SA counseling at [facility] as an outpt after d/c. Pt stated she is not interested in residential SA treatment at this time.  Pt is currently homeless and has been living in a tent near [facility] for past two years with her husband. Pt declined shelter list and reports she is aware of homeless resources in this area.  Pt is followed by [employer] and gave permission for CSW to contact [facility] with [employer] [phone]. CSW contacted Sharyn Lull and left message requesting return call.  Pt will likely need transportation assist at d/c, but reports she plans on walking to [place].  No other CSW needs identified at this time.   Assessment/plan status:  Information/Referral to [employer] Other assessment/ plan:   Information/referral to community resources:   [facility]  Alcohol cessation    Diane Gilbert'S/FAMILY'S RESPONSE TO PLAN OF CARE: Pt was pleasant and engaged during interview.  Pt appreciative of CSW alcohol cessation resources and CSW support.        Wandra Feinstein, MSW, [place] (Weekends  8:00am-4:30pm)

## 2012-02-22 DIAGNOSIS — B75 Trichinellosis: Secondary | ICD-10-CM

## 2012-02-22 DIAGNOSIS — F10239 Alcohol dependence with withdrawal, unspecified: Principal | ICD-10-CM

## 2012-02-22 DIAGNOSIS — N179 Acute kidney failure, unspecified: Secondary | ICD-10-CM

## 2012-02-22 DIAGNOSIS — E871 Hypo-osmolality and hyponatremia: Secondary | ICD-10-CM

## 2012-02-22 LAB — STOOL CULTURE

## 2012-02-22 LAB — HEPATITIS PANEL, ACUTE
HCV Ab: NEGATIVE
Hep A IgM: NEGATIVE
Hep B C IgM: NEGATIVE
Hepatitis B Surface Ag: NEGATIVE

## 2012-02-22 LAB — URINE CULTURE

## 2012-02-22 LAB — BASIC METABOLIC PANEL
CO2: 22 mEq/L (ref 19–32)
GFR calc Af Amer: >90 mL/min (ref >90)
Glucose, Bld: 130 mg/dL — ABNORMAL HIGH (ref 70–99)
Potassium: 4.1 mEq/L (ref 3.5–5.1)
Sodium: 140 mEq/L (ref 135–145)

## 2012-02-22 LAB — CBC
Hemoglobin: 9.3 g/dL — ABNORMAL LOW (ref 12.0–15.0)
MCH: 28.1 pg (ref 26.0–34.0)
MCV: 88.8 fL (ref 78.0–100.0)
Platelets: 489 10*3/uL — ABNORMAL HIGH (ref 150–400)
RBC: 3.31 MIL/uL — ABNORMAL LOW (ref 3.87–5.11)

## 2012-02-22 MED ORDER — HYDROCHLOROTHIAZIDE 25 MG PO TABS
25.0000 mg | ORAL_TABLET | Freq: Every day | ORAL | Status: DC
  Filled 2012-02-22: qty 1

## 2012-02-22 MED ORDER — AMLODIPINE BESYLATE 5 MG PO TABS
5.0000 mg | ORAL_TABLET | Freq: Every day | ORAL | Status: DC
  Administered 2012-02-22 – 2012-02-23 (×2): 5 mg via ORAL
  Filled 2012-02-22 (×3): qty 1

## 2012-02-22 NOTE — Progress Notes (Signed)
Subjective:  Episode of hematochezia last night. Still having loose stools but improved. Abdominal pain is improved. No fever or chills. Fiance at bedside this morning. Patient would like to go home.   She refused her morning labs but says she will get them if offered again.  Objective: Vital signs in last 24 hours: Filed Vitals:   02/21/12 1727 02/21/12 2059 02/22/12 0554 02/22/12 1000  BP: 164/94 160/95 162/81 136/82  Pulse: 91 88 78 87  Temp: 99.4 F (37.4 C) 99 F (37.2 C) 98.3 F (36.8 C) 98.7 F (37.1 C)  TempSrc: Oral Oral Oral Oral  Resp: _0 Height:      Weight:  159 lb 8 oz (72.349 kg)    SpO2: 97% 95% 95% 93%   Weight change: 3 lb 14.4 oz (1.769 kg)  Intake/Output Summary (Last 24 hours) at 02/22/12 1118 Last data filed at 02/22/12 0900  Gross per 24 hour  Intake 2583.75 ml  Output      0 ml  Net 2583.75 ml    Physical Exam Blood pressure 136/82, pulse 87, temperature 98.7 F (37.1 C), temperature source Oral, resp. rate 18, height _1  (1.626 m), weight 159 lb 8 oz (72.349 kg), last menstrual period 10/05/2011, SpO2 93.00%. General:  No acute distress, alert and oriented x 3, well-appearing  HEENT:  PERRL, EOMI, no lymphadenopathy, moist mucous membranes, anicteric Cardiovascular:  Regular rate and rhythm, no murmurs, rubs or gallops Respiratory:  CTAB, no wheezes, rales, or rhonchi Abdomen:  Soft, nondistended, mild diffuse tenderness, +bowel sounds Extremities:  Warm and well-perfused, no clubbing, cyanosis, or edema.  Skin: Warm, dry, no rashes Neuro: Not anxious appearing, no depressed mood, normal affect  Lab Results: CBC    Component Value Date/Time   WBC 12.5* 02/21/2012 1801   RBC 3.35* 02/21/2012 1801   HGB 9.5* 02/21/2012 1801   HCT 29.7* 02/21/2012 1801   PLT 373 02/21/2012 1801   MCV 88.7 02/21/2012 1801   MCH 28.4 02/21/2012 1801   MCHC 32.0 02/21/2012 1801   RDW 13.4 02/21/2012 1801   LYMPHSABS 1.6 02/19/2012 0520   MONOABS 2.9*  02/19/2012 0520   EOSABS 0.0 02/19/2012 0520   BASOSABS 0.0 02/19/2012 0520    BMET    Component Value Date/Time   NA 133* 02/21/2012 0900   K 3.1* 02/21/2012 0900   CL 101 02/21/2012 0900   CO2 20 02/21/2012 0900   GLUCOSE 175* 02/21/2012 0900   BUN 3* 02/21/2012 0900   CREATININE 0.72 02/21/2012 0900   CALCIUM 8.1* 02/21/2012 0900   GFRNONAA >90 02/21/2012 0900   GFRAA >90 02/21/2012 0900     Micro Results: Recent Results (from the past 240 hour(s))  URINE CULTURE     Status: Normal (Preliminary result)   Collection Time   02/19/12  5:48 AM      Component Value Range Status Comment   Specimen Description URINE, CLEAN CATCH   Final    Special Requests ADDED AT [id number] ON [id number]   Final    Culture  Setup Time 02/19/2012 11:19   Final    Colony Count >=100,000 COLONIES/ML   Final    Culture ESCHERICHIA COLI   Final    Report Status PENDING   Incomplete   STOOL CULTURE     Status: Normal   Collection Time   02/19/12  1:22 PM      Component Value Range Status Comment   Specimen Description STOOL   Final  Special Requests NONE   Final    Culture     Final    Value: NO SALMONELLA, SHIGELLA, CAMPYLOBACTER, YERSINIA, OR E.COLI 0157:H7 ISOLATED     Note: REDUCED NORMAL FLORA PRESENT   Report Status 02/22/2012 FINAL   Final   CLOSTRIDIUM DIFFICILE BY PCR     Status: Normal   Collection Time   02/19/12  1:22 PM      Component Value Range Status Comment   C difficile by pcr NEGATIVE  NEGATIVE Final     Studies/Results: No results found.  Medications: medications reviewed Scheduled Meds:    . cefTRIAXone (ROCEPHIN)  IV  1 g Intravenous Q24H  . feeding supplement  237 mL Oral TID WC  . folic acid  1 mg Oral Daily  . heparin  5,000 Units Subcutaneous Q8H  . LORazepam  0-4 mg Oral Q6H   Followed by  . LORazepam  0-4 mg Oral Q12H  . magnesium sulfate 1 - 4 g bolus IVPB  2 g Intravenous Once  . multivitamin with minerals  1 tablet Oral Daily  . pantoprazole  20 mg Oral BID AC  .  potassium chloride  10 mEq Intravenous Q1 Hr x 4  . thiamine  100 mg Oral Daily   Or  . thiamine  100 mg Intravenous Daily   Continuous Infusions:    . DISCONTD: dextrose 5 % and 0.45 % NaCl with KCl 20 mEq/L 75 mL/hr at 02/21/12 1451   PRN Meds:.acetaminophen, acetaminophen, loperamide, LORazepam, LORazepam, ondansetron (ZOFRAN) IV, ondansetron  Assessment/Plan:  Ms. Holness is a 46 year old female with a history of chronic alcoholism who presents with vomiting, abdominal pain and diarrhea for 2 weeks.  # Abdominal pain, diarrhea, vomiting: likely 2/2 gastroenteritis vs alcohol gastritis. She reports decreased abdominal pain but continues to have diarrhea. WBC trending down. C diff negative, stool cx negative for salmonella, shigella, campylobacter, yersinia or E.coli but reduced normal flora noted.  -Zofran PRN for N/V  -dc fluids at patient is taking in good PO, she is on regular diet -PPI  Hematochezia: patient had another episode of bright maroon blood with her loose stool last night. Denies history of rectal bleeding. Patient remains hemodynamically stable. BP 098J systolic.  -f/u repeat CBC -continue to monitor  #UTI: No dysuria currently. White count trending down, afebrile. Urinalysis shows nitrites and leukocytes, plus many trichomonas. WBC is too numerous to count, however, many squamous as well. No evidence of hydronephrosis on ultrasound . Would treat her as acute uncomplicated cystitis with 3 days of antibiotics. Culture with E.coli >100k colonies  -dc Rocephin IV, one time flagyl dose given for trichomonas  # Anion gap metabolic acidosis : likely 2/2 alcoholic ketoacidosis . His gap was 17  and was decreased to 15  - BMET pending for today  #Hypokalemia: likely 2/2 vomiting/diarrhea -will monitor and replete as needed  #Acute kidney injury: resolved. Cr pending today, Baseline Cr 0.7. Likely due to pre-renal from volume depletion.   # Normocytic Anemia: She  came with Hb of 13. Iron - 10 and ferritin of 599. Hb is 9.1, likely 2/2 hemodilution.  FOBT was negative. She denies any melena/tarry stool.  - She would benefit from iron supplemention at d/c.   #Alcohol Withdrawal: Past the 72-hour window at this point. Patient is a heavy drinker, 4-6 of 40 ounces of beer per day since the age of 12. No sign of withdrawal at this time. Alcohol level is <11. She has required only one  dose of ativan so far since admission.  -cont CIWA protocol  -Give Thiamine and Folate IV  -Alcohol cessation counseling  -Telemetry monitor   #DVT ppx: heparin SQ  Dispo -f/u with [facility] for addiction counseling, as patient wants to quit drinking -d/c later today or tomorrow   LOS: 3 days   Santa Lighter 02/22/2012, 11:18 AM

## 2012-02-22 NOTE — Consult Note (Signed)
[facility] Reason for consult: GI bleeding Referring Physician: Internal medicine teaching service  Diane Gilbert is an 45 y.o. female.  HPI: She was admitted 75 with abdominal pain of a UTI. Her liver tests were consistent with alcohol abuse. She's a heavy drinker 40 ounces or more of beer a day which she says she needs to help her sleep. She's been drinking like this since a teenager. She apparently had one episode of bloody bowel movement. Were asked to see her for GI bleeding. Her abdominal pain has resolved and she has subsequently had a brown bowel movement by her history. Labs are remarkable for elevated alkaline phosphatase and low albumin with minimal elevation of transaminases and a normal bilirubin. Her iron level is quite low and she does have iron deficiency. The patient wants to go home and does not want to stay for any length of time. Workup in the hospital has included negative C. difficile, stool negative for blood on admission. Stool culture was negative. Ultrasound of the abdomen was essentially normal. Patient states that she is currently pain free.  Past Medical History  Diagnosis Date  . Alcohol abuse   . Hypertension   . Insomnia     Past Surgical History  Procedure Date  . No past surgeries     History reviewed. No pertinent family history.  Social History:  reports that she has been smoking Cigarettes.  She has been smoking about .5 packs per day. She does not have any smokeless tobacco history on file. She reports that she drinks alcohol. She reports that she does not use illicit drugs.  Allergies: No Known Allergies  Medications;    . amLODipine  5 mg Oral Daily  . feeding supplement  237 mL Oral TID WC  . folic acid  1 mg Oral Daily  . heparin  5,000 Units Subcutaneous Q8H  . LORazepam  0-4 mg Oral Q6H   Followed by  . LORazepam  0-4 mg Oral Q12H  . multivitamin with minerals  1 tablet Oral Daily  . pantoprazole  20 mg Oral BID  AC  . potassium chloride  10 mEq Intravenous Q1 Hr x 4  . thiamine  100 mg Oral Daily   Or  . thiamine  100 mg Intravenous Daily  . DISCONTD: cefTRIAXone (ROCEPHIN)  IV  1 g Intravenous Q24H  . DISCONTD: hydrochlorothiazide  25 mg Oral Daily   PRN Meds acetaminophen, acetaminophen, loperamide, LORazepam, LORazepam, ondansetron (ZOFRAN) IV, ondansetron Results for orders placed during the hospital encounter of 02/19/12 (from the past 48 hour(s))  BASIC METABOLIC PANEL     Status: Abnormal   Collection Time   02/21/12  9:00 AM      Component Value Range Comment   Sodium 133 (*) 135 - 145 mEq/L    Potassium 3.1 (*) 3.5 - 5.1 mEq/L    Chloride 101  96 - 112 mEq/L    CO2 20  19 - 32 mEq/L    Glucose, Bld 175 (*) 70 - 99 mg/dL    BUN 3 (*) 6 - 23 mg/dL    Creatinine, Ser 0.72  0.50 - 1.10 mg/dL    Calcium 8.1 (*) 8.4 - 10.5 mg/dL    GFR calc non Af Amer >90  >90 mL/min    GFR calc Af Amer >90  >90 mL/min   CBC     Status: Abnormal   Collection Time   02/21/12  6:01 PM      Component Value  Range Comment   WBC 12.5 (*) 4.0 - 10.5 K/uL    RBC 3.35 (*) 3.87 - 5.11 MIL/uL    Hemoglobin 9.5 (*) 12.0 - 15.0 g/dL    HCT 29.7 (*) 36.0 - 46.0 %    MCV 88.7  78.0 - 100.0 fL    MCH 28.4  26.0 - 34.0 pg    MCHC 32.0  30.0 - 36.0 g/dL    RDW 13.4  11.5 - 15.5 %    Platelets 373  150 - 400 K/uL   OCCULT BLOOD X 1 CARD TO LAB, STOOL     Status: Normal   Collection Time   02/21/12  6:21 PM      Component Value Range Comment   Fecal Occult Bld NEGATIVE     BASIC METABOLIC PANEL     Status: Abnormal   Collection Time   02/22/12 11:05 AM      Component Value Range Comment   Sodium 140  135 - 145 mEq/L    Potassium 4.1  3.5 - 5.1 mEq/L    Chloride 107  96 - 112 mEq/L    CO2 22  19 - 32 mEq/L    Glucose, Bld 130 (*) 70 - 99 mg/dL    BUN 3 (*) 6 - 23 mg/dL    Creatinine, Ser 0.66  0.50 - 1.10 mg/dL    Calcium 9.2  8.4 - 10.5 mg/dL    GFR calc non Af Amer >90  >90 mL/min    GFR calc Af Amer >90   >90 mL/min   CBC     Status: Abnormal   Collection Time   02/22/12 11:05 AM      Component Value Range Comment   WBC 13.3 (*) 4.0 - 10.5 K/uL    RBC 3.31 (*) 3.87 - 5.11 MIL/uL    Hemoglobin 9.3 (*) 12.0 - 15.0 g/dL    HCT 29.4 (*) 36.0 - 46.0 %    MCV 88.8  78.0 - 100.0 fL    MCH 28.1  26.0 - 34.0 pg    MCHC 31.6  30.0 - 36.0 g/dL    RDW 13.5  11.5 - 15.5 %    Platelets 489 (*) 150 - 400 K/uL     No results found.              Blood pressure 173/94, pulse 82, temperature 98.5 F (36.9 C), temperature source Oral, resp. rate 20, height _0  (1.626 m), weight 72.349 kg (159 lb 8 oz), last menstrual period 10/05/2011, SpO2 97.00%.  Physical exam:   Gen.-alert African American female no acute distress Eyes-sclerae are nonicteric Heart-regular rate and rhythm without murmurs or gallops Lungs-clear Abdomen-soft and nontender  Assessment: 1. Recent bright red blood. Patient's stools were negative on admission she is iron deficient. She probably should have a colonoscopy which she does not currently want to do here in the hospital. She is iron deficient and I think she should have endoscopic workup 2. Significant alcohol abuse. Discussion with the patient about the fact that she is very likely to develop cirrhosis of the liver she continues on her current path. She does not seem inclined toward any type of withdrawal program  Plan: I would go ahead and check her hemoglobin in the morning. If she has no further bleeding she should be okay for discharge. He can have her contact our office to make an appointment for the next several weeks and we will see if she will go  ahead with some sort of diagnostic workup at that time.   Diane Gilbert JR,Diane Gilbert L 02/22/2012, 5:07 PM

## 2012-02-23 LAB — CBC
HCT: 28.9 % — ABNORMAL LOW (ref 36.0–46.0)
Hemoglobin: 9.2 g/dL — ABNORMAL LOW (ref 12.0–15.0)
RDW: 13.6 % (ref 11.5–15.5)
WBC: 14.4 10*3/uL — ABNORMAL HIGH (ref 4.0–10.5)

## 2012-02-23 LAB — POTASSIUM, STOOL: Potassium, Stl: 22 mEq/L

## 2012-02-23 MED ORDER — ADULT MULTIVITAMIN W/MINERALS CH: 1.0000 | ORAL_TABLET | Freq: Every day | ORAL | Status: DC

## 2012-02-23 MED ORDER — METRONIDAZOLE 500 MG PO TABS: 2000.0000 mg | ORAL_TABLET | Freq: Once | ORAL | Status: AC

## 2012-02-23 MED ORDER — AMLODIPINE BESYLATE 5 MG PO TABS: 5.0000 mg | ORAL_TABLET | Freq: Every day | ORAL | Status: DC

## 2012-02-23 MED ORDER — LOPERAMIDE HCL 2 MG PO CAPS: 2.0000 mg | ORAL_CAPSULE | ORAL | Status: AC | PRN

## 2012-02-23 NOTE — Progress Notes (Signed)
Subjective:  Patient is doing well this morning, sitting up in bed. No more episodes of BRBPR. Her stools are somewhat loose but improving. She denies having abdominal pain, fever, chills, shortness of breath, nausea, vomiting. She states she is ready to go home.  Objective: Vital signs in last 24 hours: Filed Vitals:   02/22/12 2044 02/23/12 0425 02/23/12 0900 02/23/12 0935  BP: 175/96 163/90  153/91  Pulse: 80 84 84 81  Temp: 98.3 F (36.8 C) 99 F (37.2 C)  97.4 F (36.3 C)  TempSrc: Oral Oral  Oral  Resp: 20 20    Height:      Weight: 156 lb 15.5 oz (71.2 kg)     SpO2: 95% 93%  95%   Weight change: -2 lb 8.5 oz (-1.149 kg)  Intake/Output Summary (Last 24 hours) at 02/23/12 1207 Last data filed at 02/23/12 0930  Gross per 24 hour  Intake    360 ml  Output      0 ml  Net    360 ml    Physical Exam Blood pressure 153/91, pulse 81, temperature 97.4 F (36.3 C), temperature source Oral, resp. rate 20, height _0  (1.626 m), weight 156 lb 15.5 oz (71.2 kg), last menstrual period 10/05/2011, SpO2 95.00%. General:  No acute distress, alert and oriented x 3, well-appearing  HEENT:  PERRL, EOMI, no lymphadenopathy, moist mucous membranes, anicteric Cardiovascular:  Regular rate and rhythm, no murmurs, rubs or gallops Respiratory:  CTAB, no wheezes, rales, or rhonchi Abdomen:  Soft, nondistended, nontender, +bowel sounds Extremities:  Warm and well-perfused, no clubbing, cyanosis, or edema.  Skin: Warm, dry, no rashes Neuro: Not anxious appearing, no depressed mood, normal affect  Lab Results: CBC    Component Value Date/Time   WBC 14.4* 02/23/2012 0812   RBC 3.28* 02/23/2012 0812   HGB 9.2* 02/23/2012 0812   HCT 28.9* 02/23/2012 0812   PLT 681* 02/23/2012 0812   MCV 88.1 02/23/2012 0812   MCH 28.0 02/23/2012 0812   MCHC 31.8 02/23/2012 0812   RDW 13.6 02/23/2012 0812   LYMPHSABS 1.6 02/19/2012 0520   MONOABS 2.9* 02/19/2012 0520   EOSABS 0.0 02/19/2012 0520   BASOSABS 0.0  02/19/2012 0520    BMET    Component Value Date/Time   NA 140 02/22/2012 1105   K 4.1 02/22/2012 1105   CL 107 02/22/2012 1105   CO2 22 02/22/2012 1105   GLUCOSE 130* 02/22/2012 1105   BUN 3* 02/22/2012 1105   CREATININE 0.66 02/22/2012 1105   CALCIUM 9.2 02/22/2012 1105   GFRNONAA >90 02/22/2012 1105   GFRAA >90 02/22/2012 1105     Micro Results: Recent Results (from the past 240 hour(s))  URINE CULTURE     Status: Normal   Collection Time   02/19/12  5:48 AM      Component Value Range Status Comment   Specimen Description URINE, CLEAN CATCH   Final    Special Requests ADDED AT 0653 ON [id number]   Final    Culture  Setup Time 02/19/2012 11:19   Final    Colony Count >=100,000 COLONIES/ML   Final    Culture     Final    Value: ESCHERICHIA COLI     Note: Two isolates with different morphologies were identified as the same organism.The most resistant organism was reported.   Report Status 02/22/2012 FINAL   Final    Organism ID, Bacteria ESCHERICHIA COLI   Final   STOOL CULTURE  Status: Normal   Collection Time   02/19/12  1:22 PM      Component Value Range Status Comment   Specimen Description STOOL   Final    Special Requests NONE   Final    Culture     Final    Value: NO SALMONELLA, SHIGELLA, CAMPYLOBACTER, YERSINIA, OR E.COLI 0157:H7 ISOLATED     Note: REDUCED NORMAL FLORA PRESENT   Report Status 02/22/2012 FINAL   Final   CLOSTRIDIUM DIFFICILE BY PCR     Status: Normal   Collection Time   02/19/12  1:22 PM      Component Value Range Status Comment   C difficile by pcr NEGATIVE  NEGATIVE Final     Studies/Results: No results found.  Medications: medications reviewed Scheduled Meds:    . amLODipine  5 mg Oral Daily  . feeding supplement  237 mL Oral TID WC  . folic acid  1 mg Oral Daily  . heparin  5,000 Units Subcutaneous Q8H  . LORazepam  0-4 mg Oral Q12H  . multivitamin with minerals  1 tablet Oral Daily  . pantoprazole  20 mg Oral BID AC  . thiamine  100 mg  Oral Daily   Or  . thiamine  100 mg Intravenous Daily  . DISCONTD: cefTRIAXone (ROCEPHIN)  IV  1 g Intravenous Q24H  . DISCONTD: hydrochlorothiazide  25 mg Oral Daily   Continuous Infusions:   PRN Meds:.acetaminophen, acetaminophen, loperamide, LORazepam, LORazepam, ondansetron (ZOFRAN) IV, ondansetron  Assessment/Plan:  Ms. Agrawal is a 46 year old female with a history of chronic alcoholism who presents with vomiting, abdominal pain and diarrhea for 2 weeks.  # Abdominal pain, diarrhea, vomiting: likely 2/2 gastroenteritis vs alcohol gastritis. She reports decreased abdominal pain but continues to have diarrhea. WBC trending down. C diff negative, stool cx negative for salmonella, shigella, campylobacter, yersinia or E.coli but reduced normal flora noted.  -improved. Still some loose stools, but no N/V, tolerating regular diet well -will d/c with PPI  Hematochezia: Resolved. Patient remains hemodynamically stable. HGB today is 9.2. No further episodes of bleeding. -appreciate GI recs. Will have her follow up at [facility] in 2-3 weeks  Hypertension -patient has been hypertensive during this admission -will start low-dose norvaasc and follow up with PCP for BP check  #UTI: No dysuria currently. White count trending down, afebrile. Urinalysis shows nitrites and leukocytes, plus many trichomonas. WBC is too numerous to count, however, many squamous as well. No evidence of hydronephrosis on ultrasound . Would treat her as acute uncomplicated cystitis with 3 days of antibiotics. Culture with E.coli >100k colonies -treated with Rocephin, one time flagyl dose given for trichomonas -will give flagyl to partner  # Anion gap metabolic acidosis : likely 2/2 alcoholic ketoacidosis . His gap was 17  and was decreased to 15  - BMET pending for today  #Hypokalemia: likely 2/2 vomiting/diarrhea -will monitor and replete as needed  #Acute kidney injury: resolved. Cr pending today, Baseline  Cr 0.7. Likely due to pre-renal from volume depletion.   # Normocytic Anemia: She came with Hb of 13. Iron - 10 and ferritin of 599. Hb is 9.1, likely 2/2 hemodilution.  FOBT was negative. She denies any melena/tarry stool.  - She would benefit from iron supplemention at d/c.   #Alcohol Abuse: Past the 72-hour withdrawal window at this point. Patient is a heavy drinker, 4-6 of 40 ounces of beer per day since the age of 21. No sign of withdrawal at this time. Alcohol  level is <11. She has required only one dose of ativan so far since admission. Patient with evidence of liver damage from alcohol - AST 65, ALT 31.  -Give Thiamine and Folate IV, encourage good PO intake at home, multivitamin -Alcohol cessation counseling, pt to be seen at [facility]  #DVT ppx: heparin SQ  Dispo -f/u with [facility] for addiction counseling, as patient wants to quit drinking -d/c today with GI f/u -will give patient list of healthcare resources in the area from which she can choose a PCP   LOS: 4 days   Santa Lighter 02/23/2012, 12:07 PM

## 2012-02-23 NOTE — Discharge Summary (Signed)
[facility] Discharge Note  Name: Diane Gilbert MRN: [id number] DOB: Mar 12, 1966 46 y.o.  Date of Admission: 02/19/2012  4:56 AM Date of Discharge: 02/23/2012 Attending Physician: Bartholomew Crews, MD  Discharge Diagnosis: Active Problems:  Alcohol abuse  Dehydration  Hyponatremia  Nausea vomiting and diarrhea  Pyelonephritis  Renal insufficiency  Abdominal pain  Hypokalemia  Leukocytosis  Anemia  Trichomoniasis   Discharge Medications: Medication List  As of 02/23/2012  1:34 PM   TAKE these medications         amLODipine 5 MG tablet   Commonly known as: NORVASC   Take 1 tablet (5 mg total) by mouth daily.      loperamide 2 MG capsule   Commonly known as: IMODIUM   Take 1 capsule (2 mg total) by mouth as needed for diarrhea or loose stools.      metroNIDAZOLE 500 MG tablet   Commonly known as: FLAGYL   Take 4 tablets (2,000 mg total) by mouth once. Please give to partner.      multivitamin with minerals Tabs   Take 1 tablet by mouth daily.            Disposition and follow-up:   Diane Gilbert was discharged from [facility] in Stable condition.  At the hospital follow up visit please address the following issues:  -alcohol abuse and cessation counseling -f/u with hematochezia -f/u CBC and anemia work up  Follow-up Appointments: Follow-up Information    Please follow up. (please choose a primary doctor from the provided list and call to schedule and appointment in the next 2-3 weeks to establish care)       Follow up with EDWARDS JR,JAMES L, MD. (please call to schedule an appointment in 3 weeks. Have medicaid card ready)    Contact information:   [address], [place], [place]. [place] [phone]       Please follow up. Beverly Sessions - please go to [facility] to get set up with Medicaid. )           Consultations: Treatment Team:  Winfield Cunas., MD  Procedures Performed:  US Abdomen Complete  02/19/2012  *RADIOLOGY REPORT*  Clinical Data:  Right upper quadrant abdominal pain with CVA tenderness.  History of alcohol abuse.  Question cholecystitis.  COMPLETE ABDOMINAL ULTRASOUND  Comparison:  Abdominal pelvic CT 03/11/2011.  Findings:  Gallbladder: Well distended without wall thickening, stones or pericholecystic fluid. Minimal debris was noted within the gallbladder lumen.  Sonographic Murphy's sign is absent.  Common bile duct:   Normal in caliber without filling defects.  Liver:  Echogenicity is within normal limits.  No focal hepatic abnormalities are identified.  IVC:  Visualized portions appear unremarkable.  Pancreas:  Visualized portions appear unremarkable.  Spleen:  Visualized portions appear unremarkable.  Right Kidney:   The renal cortical thickness and echogenicity are preserved.  There is no hydronephrosis or focal abnormality. Renal length is 13.4 cm.  Left Kidney:   The renal cortical thickness and echogenicity are preserved.  There is no hydronephrosis or focal abnormality. Renal length is 4.0 cm.  Abdominal aorta:  Visualized portions appear unremarkable. The distal aorta is obscured by bowel gas.  IMPRESSION: No acute findings.  No evidence of cholecystitis.   Original Report Authenticated By: Vivia Ewing, M.D.    Dg Chest Portable 1 View  02/19/2012  *RADIOLOGY REPORT*  Clinical Data: Cough and chest pain.  PORTABLE CHEST - 1 VIEW  Comparison: 03/11/2011  Findings: 0738 hours. The lungs are clear without focal infiltrate, edema, pneumothorax or pleural effusion. Cardiopericardial silhouette is at upper limits of normal for size. Imaged bony structures of the thorax are intact. Telemetry leads overlie the chest.  IMPRESSION: Upper normal heart size.  No acute cardiopulmonary findings.   Original Report Authenticated By: ERIC A. MANSELL, M.D.     Admission HPI:  Ms. Womac is a 46 yo woman with PMH  of alcohol abuse who drinks about 4-6 of 40 ounces of beer per day presents to the ED for complaints of abdominal pain, back pain, N/V, fever, cough x 2-3 weeks in duration. Patient reported that her abdominal pain is constant, sore and sharp in quality, 9/10 in severity, locating at the right and left upper quadrant without any radiation. Her abdominal pain is not associated with food intake and no alleviation factor. Patient also has subjective fever at home in the past 2 weeks with chills. She also has unintentional weight loss do to her inability to keep down her food. She also has nausea and vomiting about 3-4 times a day which is nonbloody and nonbilious. She also reported having watery diarrhea that is nonbloody in the past 2 weeks and she has about 4-5 episodes per day. She denies any recent travel or eating seafood. Patient denies any dysuria, burning sensation, vaginal discharge or malodor. Her last menstrual period was 01/28/2012. Her last sexual activity was about 3 weeks ago with her female partner. She reports only have one partner in the past 3 months. Her last alcoholic drink was one day prior to admission which she drank about 24 ounces to help her go to sleep. She denies any chest pain or shortness of breath. She denies any similar episodes in the past and states that this is her first time having abdominal pain and diarrhea.   Hospital Course by problem list: Active Problems:  Alcohol abuse  Dehydration  Hyponatremia  Nausea vomiting and diarrhea  Pyelonephritis  Renal insufficiency  Abdominal pain  Hypokalemia  Leukocytosis  Anemia  Trichomoniasis   Abdominal pain, diarrhea, vomiting: Patient presented with 2 weeks of vomiting and diarrhea. She was found to have hypokalemia, an anion gap metabolic acidosis, elevated alk phos and AST, decreased albumin. She was rehydrated with IVF, K repleted. Zofran given for nausea. Concern for pancreatitis given heavy alcohol use. Lipase was 39,  abdominal ultrasound was normal, no bile duct dilation, no evidence of pancreatitis, no gallstones. C diff negative, stool cx negative for salmonella, shigella, campylobacter, yersinia or E.coli but reduced normal flora noted. During the course of her hospitalization, her abdominal pain gradually improved and her vomiting and diarrhea subsided. On the day of discharge, she was not complaining of abdominal pain and her stools were more formed. She was tolerating a regular diet.  UTI: Patient came in with a low grade fever and a white count of 18. Urine sample with TNTC WBC, many Trichomonas, +nitrites. No evidence of hydronephrosis on ultrasound. She was given a course of IV Rocephin to treat her UTI as well as Flagyl for Trichomonas. Urine culture grew out E.coli >100k colonies sensitive to rocephin. Pt given Rx to treat her partner for Trichomonas.  Hematochezia: Patient had two episodes of BRBPR during her hospitalization. CBC was monitored, VS were stable, and her hemoglobin remained stable. GI was consulted and deferred on inpatient work up as her bleeding resolved and Hgb was stable. She will f/u as  outpatient.  Hypertension: Patient was hypertensive to 170s during this admission. She was started on low-dose norvaasc and will follow up with PCP for BP check.  AKI: patient presented with a Cr of 1.19, likely 2/2 hypovolemia previous to admission. She was given IVF and treated symptomatically for vomiting and her creatinine normalized. At the time of discharge, he Creatinine was 0.66  Hypokalemia: Patient presented with K of 2.8, likely 2/2 vomiting/diarrhea. She was given K replacement.   Normocytic Anemia: She came with Hb of 13. Iron - 10 and ferritin of 599. Hb remained stable. Recommend outpatient follow up.  Alcohol Abuse: Patient drinks 4-6 of 40 ounces of beer per day since the age of 57. Alcohol level was <11 on admission. Patient was placed on CIWA protocol did not show signs of  withdrawal. Patient with evidence of liver damage from alcohol - AST 65, ALT 31. She was given Thiamine and Folate IV, encourage good PO intake at home, multivitamin. Alcohol cessation counseling, pt to be seen at [facility] for addiction counseling f/u.    Discharge Vitals:  BP 153/91  Pulse 81  Temp 97.4 F (36.3 C) (Oral)  Resp 20  Ht _0  (1.626 m)  Wt 156 lb 15.5 oz (71.2 kg)  BMI 26.94 kg/m2  SpO2 95%  LMP 10/05/2011  Discharge Labs:  Results for orders placed during the hospital encounter of 02/19/12 (from the past 24 hour(s))  CBC     Status: Abnormal   Collection Time   02/23/12  8:12 AM      Component Value Range   WBC 14.4 (*) 4.0 - 10.5 K/uL   RBC 3.28 (*) 3.87 - 5.11 MIL/uL   Hemoglobin 9.2 (*) 12.0 - 15.0 g/dL   HCT 28.9 (*) 36.0 - 46.0 %   MCV 88.1  78.0 - 100.0 fL   MCH 28.0  26.0 - 34.0 pg   MCHC 31.8  30.0 - 36.0 g/dL   RDW 13.6  11.5 - 15.5 %   Platelets 681 (*) 150 - 400 K/uL    Signed: Santa Lighter 02/23/2012, 1:34 PM   Time Spent on Discharge: 30 minutes

## 2012-02-23 NOTE — Progress Notes (Signed)
Pt d/c instructions reviewed, pt verbalized understanding.  IV removed.  Bus pass obtained for patient as well as her boyfriend to get home.  Pt verbalized that she understood how to take her medications, where to pick them up, and the importance of giving her partner the flagyl as the MD prescribed.  Pt belongings gathered and patient taken downstairs to wait on the bus.   Jennette Banker

## 2012-02-23 NOTE — Progress Notes (Signed)
[facility] PROGRESS NOTE Subjective Patient states that she is not having any further bleeding. She is having no further abdominal pain. She wants to go home.  Objective: Vital signs in last 24 hours: Temp:  [98.3 F (36.8 C)-99 F (37.2 C)] 99 F (37.2 C) (08/27 0425) Pulse Rate:  [80-87] 84  (08/27 0425) Resp:  [18-20] 20  (08/27 0425) BP: (136-175)/(82-96) 163/90 mmHg (08/27 0425) SpO2:  [93 %-97 %] 93 % (08/27 0425) Weight:  [71.2 kg (156 lb 15.5 oz)] 71.2 kg (156 lb 15.5 oz) (08/26 2044) Last BM Date: 02/22/12  Intake/Output from previous day: 08/26 0701 - 08/27 0700 In: 1193.8 [P.O.:480; I.V.:663.8; IV Piggyback:50] Out: -  Intake/Output this shift:    PE: Abdomen-soft and nontender  Lab Results:  Basename 02/22/12 1105 02/21/12 1801  WBC 13.3* 12.5*  HGB 9.3* 9.5*  HCT 29.4* 29.7*  PLT 489* 373   BMET  Basename 02/22/12 1105 02/21/12 0900  NA 140 133*  K 4.1 3.1*  CL 107 101  CO2 22 20  CREATININE 0.66 0.72   LFT No results found for this basename: PROT:3ALBUMIN:3,AST:3,ALT:3,ALKPHOS:3,BILITOT:3,BILIDIR:3,IBILI:3 in the last 72 hours PT/INR No results found for this basename: LABPROT:3,INR:3 in the last 72 hours PANCREAS No results found for this basename: LIPASE:3 in the last 72 hours       Studies/Results: No results found.  Medications: I have reviewed the patient's current medications.  Assessment/Plan: 1. Hematochezia-this was small volume and appears to have resolved. Patient does not currently wish to have any tests done. Therefore I would suggest sending her home when ready. Have encouraged her to stop drinking. We'll plan on seeing her back in the office in 3-4 weeks. Please give her her phone number and she can call and make a point. If she still having bleeding, we can arrange outpatient colonoscopy.   Edon Hoadley JR,Elka Satterfield L 02/23/2012, 7:27 AM

## 2012-04-29 ENCOUNTER — Other Ambulatory Visit: Payer: Self-pay | Admitting: Internal Medicine

## 2012-05-02 NOTE — Telephone Encounter (Signed)
We had pt in hospital but she is not our pt. We provided her with list to find PCP.

## 2012-10-20 ENCOUNTER — Other Ambulatory Visit: Payer: Self-pay

## 2012-10-20 DIAGNOSIS — Z1231 Encounter for screening mammogram for malignant neoplasm of breast: Secondary | ICD-10-CM

## 2012-10-27 ENCOUNTER — Ambulatory Visit
Admission: RE | Admit: 2012-10-27 | Discharge: 2012-10-27 | Disposition: A | Discharge status: Home / Self Care | Payer: Medicaid Other | Source: Ambulatory Visit

## 2012-10-27 DIAGNOSIS — Z1231 Encounter for screening mammogram for malignant neoplasm of breast: Secondary | ICD-10-CM

## 2012-10-28 ENCOUNTER — Other Ambulatory Visit: Payer: Self-pay | Admitting: Internal Medicine

## 2012-10-28 DIAGNOSIS — R928 Other abnormal and inconclusive findings on diagnostic imaging of breast: Secondary | ICD-10-CM

## 2012-11-09 ENCOUNTER — Other Ambulatory Visit: Payer: Medicaid Other

## 2012-11-14 ENCOUNTER — Ambulatory Visit
Admission: RE | Admit: 2012-11-14 | Discharge: 2012-11-14 | Disposition: A | Discharge status: Home / Self Care | Payer: Medicaid Other | Source: Ambulatory Visit | Attending: Internal Medicine | Admitting: Internal Medicine

## 2012-11-14 DIAGNOSIS — R928 Other abnormal and inconclusive findings on diagnostic imaging of breast: Secondary | ICD-10-CM

## 2012-11-25 ENCOUNTER — Other Ambulatory Visit: Payer: Self-pay | Admitting: Internal Medicine

## 2012-11-25 DIAGNOSIS — M545 Low back pain: Secondary | ICD-10-CM

## 2012-12-03 ENCOUNTER — Other Ambulatory Visit: Payer: Medicaid Other

## 2012-12-06 ENCOUNTER — Other Ambulatory Visit: Payer: Self-pay | Admitting: Internal Medicine

## 2012-12-06 ENCOUNTER — Ambulatory Visit
Admission: RE | Admit: 2012-12-06 | Discharge: 2012-12-06 | Disposition: A | Discharge status: Home / Self Care | Payer: Medicaid Other | Source: Ambulatory Visit | Attending: Internal Medicine | Admitting: Internal Medicine

## 2012-12-06 DIAGNOSIS — IMO0002 Reserved for concepts with insufficient information to code with codable children: Secondary | ICD-10-CM

## 2012-12-07 ENCOUNTER — Inpatient Hospital Stay: Admission: RE | Admit: 2012-12-07 | Payer: Medicaid Other | Source: Ambulatory Visit

## 2012-12-14 ENCOUNTER — Ambulatory Visit
Admission: RE | Admit: 2012-12-14 | Discharge: 2012-12-14 | Disposition: A | Discharge status: Home / Self Care | Payer: Medicaid Other | Source: Ambulatory Visit | Attending: Internal Medicine | Admitting: Internal Medicine

## 2012-12-14 DIAGNOSIS — M545 Low back pain: Secondary | ICD-10-CM

## 2013-01-23 ENCOUNTER — Ambulatory Visit (INDEPENDENT_AMBULATORY_CARE_PROVIDER_SITE_OTHER): Payer: Medicaid Other | Admitting: Obstetrics

## 2013-01-23 ENCOUNTER — Encounter: Payer: Self-pay | Admitting: Obstetrics

## 2013-01-23 VITALS — BP 102/71 | HR 82 | Temp 99.1°F | Ht 64.0 in | Wt 218.0 lb

## 2013-01-23 DIAGNOSIS — F17200 Nicotine dependence, unspecified, uncomplicated: Secondary | ICD-10-CM

## 2013-01-23 DIAGNOSIS — N926 Irregular menstruation, unspecified: Secondary | ICD-10-CM | POA: Insufficient documentation

## 2013-01-23 DIAGNOSIS — A499 Bacterial infection, unspecified: Secondary | ICD-10-CM

## 2013-01-23 DIAGNOSIS — N760 Acute vaginitis: Secondary | ICD-10-CM

## 2013-01-23 DIAGNOSIS — Z0000 Encounter for general adult medical examination without abnormal findings: Secondary | ICD-10-CM

## 2013-01-23 DIAGNOSIS — N949 Unspecified condition associated with female genital organs and menstrual cycle: Secondary | ICD-10-CM | POA: Insufficient documentation

## 2013-01-23 DIAGNOSIS — B9689 Other specified bacterial agents as the cause of diseases classified elsewhere: Secondary | ICD-10-CM

## 2013-01-23 MED ORDER — METRONIDAZOLE 500 MG PO TABS: 500.0000 mg | ORAL_TABLET | Freq: Two times a day (BID) | ORAL | Status: DC

## 2013-01-23 NOTE — Progress Notes (Signed)
Subjective:     Diane Gilbert is a 47 y.o. female here for a routine exam.  Current complaints: an annual exam.   Personal health questionnaire reviewed: yes.   Gynecologic History No LMP recorded. Contraception: none Last Pap: in 1990. Results were: normal Last mammogram: 2014. Results were: normal  Obstetric History OB History   Grav Para Term Preterm Abortions TAB SAB Ect Mult Living   _0       The following portions of the patient's history were reviewed and updated as appropriate: allergies, current medications, past family history, past medical history, past social history, past surgical history and problem list.  Review of Systems Pertinent items are noted in HPI.    Objective:    General appearance: alert and no distress Breasts: normal appearance, no masses or tenderness Abdomen: normal findings: soft, non-tender Pelvic: cervix normal in appearance, external genitalia normal, no cervical motion tenderness, positive findings: positive whiff test, tenderness of left adnexa(e), vaginal discharge:  thin and malodorous or vaginal pH > 4.5, uterus normal size, shape, and consistency and NT. Extremities: extremities normal, atraumatic, no cyanosis or edema    Assessment:    Healthy female exam.   LLQ pain.  BV  Irregular periods.  Sometimes 2 periods a month. Smoking Addiction.  Does not want to quit.  Meds offered to assist in quitting.   Plan:    Education reviewed: safe sex/STD prevention, self breast exams and management of pelvic pain. Contraception: none. Follow up in: 2 weeks. Ultrasound ordered.   Flagyl Rx.  May need sonohysterography.

## 2013-01-24 LAB — GC/CHLAMYDIA PROBE AMP
CT Probe RNA: NEGATIVE
GC Probe RNA: NEGATIVE

## 2013-01-24 LAB — PAP IG W/ RFLX HPV ASCU

## 2013-02-01 ENCOUNTER — Ambulatory Visit (HOSPITAL_COMMUNITY)
Admission: RE | Admit: 2013-02-01 | Discharge: 2013-02-01 | Disposition: A | Discharge status: Home / Self Care | Payer: Medicaid Other | Source: Ambulatory Visit | Attending: Obstetrics | Admitting: Obstetrics

## 2013-02-01 DIAGNOSIS — N926 Irregular menstruation, unspecified: Secondary | ICD-10-CM | POA: Insufficient documentation

## 2013-02-01 DIAGNOSIS — R1032 Left lower quadrant pain: Secondary | ICD-10-CM | POA: Insufficient documentation

## 2013-02-01 DIAGNOSIS — N949 Unspecified condition associated with female genital organs and menstrual cycle: Secondary | ICD-10-CM

## 2013-02-07 ENCOUNTER — Ambulatory Visit: Payer: Medicaid Other | Admitting: Obstetrics

## 2013-02-08 ENCOUNTER — Ambulatory Visit: Payer: Medicaid Other | Admitting: Obstetrics

## 2013-02-22 ENCOUNTER — Other Ambulatory Visit: Payer: Self-pay | Admitting: Neurosurgery

## 2013-03-06 ENCOUNTER — Encounter (HOSPITAL_COMMUNITY): Payer: Self-pay | Admitting: Pharmacy Technician

## 2013-03-08 ENCOUNTER — Encounter (HOSPITAL_COMMUNITY)
Admission: RE | Admit: 2013-03-08 | Discharge: 2013-03-08 | Disposition: A | Discharge status: Home / Self Care | Payer: Medicaid Other | Source: Ambulatory Visit | Attending: Neurosurgery | Admitting: Neurosurgery

## 2013-03-08 ENCOUNTER — Encounter (HOSPITAL_COMMUNITY): Payer: Self-pay

## 2013-03-08 DIAGNOSIS — Z01810 Encounter for preprocedural cardiovascular examination: Secondary | ICD-10-CM | POA: Insufficient documentation

## 2013-03-08 DIAGNOSIS — Z01818 Encounter for other preprocedural examination: Secondary | ICD-10-CM | POA: Insufficient documentation

## 2013-03-08 DIAGNOSIS — Z01812 Encounter for preprocedural laboratory examination: Secondary | ICD-10-CM | POA: Insufficient documentation

## 2013-03-08 HISTORY — DX: Major depressive disorder, single episode, unspecified: F32.9

## 2013-03-08 HISTORY — DX: Headache: R51

## 2013-03-08 HISTORY — DX: Unspecified osteoarthritis, unspecified site: M19.90

## 2013-03-08 HISTORY — DX: Depression, unspecified: F32.A

## 2013-03-08 HISTORY — DX: Anxiety disorder, unspecified: F41.9

## 2013-03-08 LAB — COMPREHENSIVE METABOLIC PANEL
ALT: 13 U/L (ref 0–35)
AST: 20 U/L (ref 0–37)
Alkaline Phosphatase: 80 U/L (ref 39–117)
CO2: 27 mEq/L (ref 19–32)
Chloride: 103 mEq/L (ref 96–112)
GFR calc non Af Amer: >90 mL/min (ref >90)
Glucose, Bld: 107 mg/dL — ABNORMAL HIGH (ref 70–99)
Sodium: 138 mEq/L (ref 135–145)
Total Bilirubin: 0.1 mg/dL — ABNORMAL LOW (ref 0.3–1.2)

## 2013-03-08 LAB — CBC
HCT: 40.8 % (ref 36.0–46.0)
MCH: 25.9 pg — ABNORMAL LOW (ref 26.0–34.0)
MCV: 84 fL (ref 78.0–100.0)
RBC: 4.86 MIL/uL (ref 3.87–5.11)
WBC: 12.7 10*3/uL — ABNORMAL HIGH (ref 4.0–10.5)

## 2013-03-08 LAB — TYPE AND SCREEN

## 2013-03-08 NOTE — Pre-Procedure Instructions (Signed)
Diane Gilbert  03/08/2013   Your procedure is scheduled on:  Friday March 10, 2013  Report to [facility] at [place] [facility].  Call this number if you have problems the morning of surgery: [phone]   Remember:   Do not eat food or drink liquids after midnight Thursday.   Take these medicines the morning of surgery with A SIP OF WATER: Abilify, Wellbutrin, Neurontin, and Percocet if needed for pain              Discontinue all NSAIDS, Herbal medication and Aspirin 7 days prior to surgery.  Do not wear jewelry, make-up or nail polish.  Do not wear lotions, powders, or perfumes. You may wear deodorant.  Do not shave 48 hours prior to surgery.   Do not bring valuables to the hospital.  [facility] is not responsible for any belongings or valuables.  Contacts, dentures or bridgework may not be worn into surgery.  Leave suitcase in the car. After surgery it may be brought to your room.  For patients admitted to the hospital, checkout time is 11:00 AM the day of  discharge.   Patients discharged the day of surgery will not be allowed to drive home.    Special Instructions: Shower using CHG 2 nights before surgery and the night before surgery.  If you shower the day of surgery use CHG.  Use special wash - you have one bottle of CHG for all showers.  You should use approximately 1/3 of the bottle for each shower.   Please read over the following fact sheets that you were given: Pain Booklet, Coughing and Deep Breathing, Blood Transfusion Information, MRSA Information and Surgical Site Infection Prevention

## 2013-03-09 MED ORDER — CEFAZOLIN SODIUM-DEXTROSE 2-3 GM-% IV SOLR
2.0000 g | INTRAVENOUS | Status: AC
  Administered 2013-03-10: 2 g via INTRAVENOUS
  Filled 2013-03-09: qty 50

## 2013-03-10 ENCOUNTER — Encounter (HOSPITAL_COMMUNITY): Payer: Self-pay | Admitting: Certified Registered Nurse Anesthetist

## 2013-03-10 ENCOUNTER — Encounter (HOSPITAL_COMMUNITY): Payer: Self-pay | Admitting: Anesthesiology

## 2013-03-10 ENCOUNTER — Inpatient Hospital Stay (HOSPITAL_COMMUNITY): Payer: Medicaid Other

## 2013-03-10 ENCOUNTER — Inpatient Hospital Stay (HOSPITAL_COMMUNITY): Payer: Medicaid Other | Admitting: Anesthesiology

## 2013-03-10 ENCOUNTER — Inpatient Hospital Stay (HOSPITAL_COMMUNITY)
Admission: RE | Admit: 2013-03-10 | Discharge: 2013-03-16 | DRG: 460 | Disposition: A | Discharge status: Home / Self Care | Payer: Medicaid Other | Source: Ambulatory Visit | Attending: Neurosurgery | Admitting: Neurosurgery

## 2013-03-10 ENCOUNTER — Encounter (HOSPITAL_COMMUNITY)
Admission: RE | Disposition: A | Discharge status: Home / Self Care | Payer: Self-pay | Source: Ambulatory Visit | Attending: Neurosurgery

## 2013-03-10 DIAGNOSIS — M47817 Spondylosis without myelopathy or radiculopathy, lumbosacral region: Principal | ICD-10-CM | POA: Diagnosis present

## 2013-03-10 DIAGNOSIS — M5137 Other intervertebral disc degeneration, lumbosacral region: Secondary | ICD-10-CM | POA: Diagnosis present

## 2013-03-10 DIAGNOSIS — F17200 Nicotine dependence, unspecified, uncomplicated: Secondary | ICD-10-CM | POA: Diagnosis present

## 2013-03-10 DIAGNOSIS — Z01810 Encounter for preprocedural cardiovascular examination: Secondary | ICD-10-CM

## 2013-03-10 DIAGNOSIS — I10 Essential (primary) hypertension: Secondary | ICD-10-CM | POA: Diagnosis present

## 2013-03-10 DIAGNOSIS — M51379 Other intervertebral disc degeneration, lumbosacral region without mention of lumbar back pain or lower extremity pain: Secondary | ICD-10-CM | POA: Diagnosis present

## 2013-03-10 DIAGNOSIS — Z01812 Encounter for preprocedural laboratory examination: Secondary | ICD-10-CM

## 2013-03-10 DIAGNOSIS — Z6839 Body mass index (BMI) 39.0-39.9, adult: Secondary | ICD-10-CM

## 2013-03-10 DIAGNOSIS — M48062 Spinal stenosis, lumbar region with neurogenic claudication: Secondary | ICD-10-CM

## 2013-03-10 SURGERY — POSTERIOR LUMBAR FUSION 1 LEVEL
Anesthesia: General | Site: Back | Wound class: Clean

## 2013-03-10 MED ORDER — HYDROMORPHONE HCL PF 1 MG/ML IJ SOLN
0.2500 mg | INTRAMUSCULAR | Status: DC | PRN
  Administered 2013-03-10 (×2): 0.5 mg via INTRAVENOUS

## 2013-03-10 MED ORDER — MIDAZOLAM HCL 5 MG/5ML IJ SOLN
INTRAMUSCULAR | Status: DC | PRN
  Administered 2013-03-10: 2 mg via INTRAVENOUS

## 2013-03-10 MED ORDER — GABAPENTIN 100 MG PO CAPS: 100.0000 mg | ORAL_CAPSULE | Freq: Four times a day (QID) | ORAL | Status: DC

## 2013-03-10 MED ORDER — CEFAZOLIN SODIUM-DEXTROSE 2-3 GM-% IV SOLR
INTRAVENOUS | Status: AC
  Filled 2013-03-10: qty 50

## 2013-03-10 MED ORDER — FENTANYL CITRATE 0.05 MG/ML IJ SOLN
INTRAMUSCULAR | Status: DC | PRN
  Administered 2013-03-10 (×2): 50 ug via INTRAVENOUS
  Administered 2013-03-10: 150 ug via INTRAVENOUS
  Administered 2013-03-10: 100 ug via INTRAVENOUS

## 2013-03-10 MED ORDER — THROMBIN 20000 UNITS EX SOLR
CUTANEOUS | Status: DC | PRN
  Administered 2013-03-10: 17:00:00 via TOPICAL

## 2013-03-10 MED ORDER — POTASSIUM CHLORIDE IN NACL 20-0.9 MEQ/L-% IV SOLN
INTRAVENOUS | Status: DC
  Administered 2013-03-11: 03:00:00 via INTRAVENOUS
  Filled 2013-03-10 (×9): qty 1000

## 2013-03-10 MED ORDER — PHENOL 1.4 % MT LIQD: 1.0000 | OROMUCOSAL | Status: DC | PRN

## 2013-03-10 MED ORDER — POLYETHYLENE GLYCOL 3350 17 G PO PACK
17.0000 g | PACK | Freq: Every day | ORAL | Status: DC | PRN
  Administered 2013-03-14: 17 g via ORAL
  Filled 2013-03-10: qty 1

## 2013-03-10 MED ORDER — HYDROMORPHONE HCL PF 1 MG/ML IJ SOLN
0.5000 mg | INTRAMUSCULAR | Status: DC | PRN
  Administered 2013-03-11: 1 mg via INTRAVENOUS
  Filled 2013-03-10: qty 1

## 2013-03-10 MED ORDER — LIDOCAINE HCL (CARDIAC) 20 MG/ML IV SOLN
INTRAVENOUS | Status: DC | PRN
  Administered 2013-03-10: 40 mg via INTRAVENOUS

## 2013-03-10 MED ORDER — PROPOFOL 10 MG/ML IV BOLUS
INTRAVENOUS | Status: DC | PRN
  Administered 2013-03-10: 150 mg via INTRAVENOUS

## 2013-03-10 MED ORDER — SODIUM CHLORIDE 0.9 % IV SOLN: 250.0000 mL | INTRAVENOUS | Status: DC

## 2013-03-10 MED ORDER — GABAPENTIN 400 MG PO CAPS
400.0000 mg | ORAL_CAPSULE | Freq: Every day | ORAL | Status: DC
  Administered 2013-03-10 – 2013-03-15 (×6): 400 mg via ORAL
  Filled 2013-03-10 (×7): qty 1

## 2013-03-10 MED ORDER — ONDANSETRON HCL 4 MG/2ML IJ SOLN
INTRAMUSCULAR | Status: DC | PRN
  Administered 2013-03-10: 4 mg via INTRAVENOUS

## 2013-03-10 MED ORDER — TIZANIDINE HCL 4 MG PO TABS
4.0000 mg | ORAL_TABLET | Freq: Three times a day (TID) | ORAL | Status: DC
  Administered 2013-03-10 – 2013-03-16 (×18): 4 mg via ORAL
  Filled 2013-03-10 (×19): qty 1

## 2013-03-10 MED ORDER — ARTIFICIAL TEARS OP OINT
TOPICAL_OINTMENT | OPHTHALMIC | Status: DC | PRN
  Administered 2013-03-10: 1 via OPHTHALMIC

## 2013-03-10 MED ORDER — HYDROCHLOROTHIAZIDE 12.5 MG PO CAPS
12.5000 mg | ORAL_CAPSULE | Freq: Every day | ORAL | Status: DC
  Administered 2013-03-11 – 2013-03-14 (×4): 12.5 mg via ORAL
  Filled 2013-03-10 (×6): qty 1

## 2013-03-10 MED ORDER — CEFAZOLIN SODIUM 1-5 GM-% IV SOLN
INTRAVENOUS | Status: AC
  Filled 2013-03-10: qty 50

## 2013-03-10 MED ORDER — TIZANIDINE HCL 4 MG PO CAPS: 4.0000 mg | ORAL_CAPSULE | Freq: Three times a day (TID) | ORAL | Status: DC

## 2013-03-10 MED ORDER — GABAPENTIN 100 MG PO CAPS
100.0000 mg | ORAL_CAPSULE | ORAL | Status: DC
  Administered 2013-03-11 – 2013-03-16 (×18): 100 mg via ORAL
  Filled 2013-03-10 (×19): qty 1

## 2013-03-10 MED ORDER — SODIUM CHLORIDE 0.9 % IJ SOLN: 3.0000 mL | INTRAMUSCULAR | Status: DC | PRN

## 2013-03-10 MED ORDER — QUETIAPINE FUMARATE 100 MG PO TABS
100.0000 mg | ORAL_TABLET | Freq: Every day | ORAL | Status: DC
  Administered 2013-03-10 – 2013-03-15 (×6): 100 mg via ORAL
  Filled 2013-03-10 (×7): qty 1

## 2013-03-10 MED ORDER — OXYCODONE HCL 5 MG/5ML PO SOLN: 5.0000 mg | Freq: Once | ORAL | Status: DC | PRN

## 2013-03-10 MED ORDER — LACTATED RINGERS IV SOLN
INTRAVENOUS | Status: DC | PRN
  Administered 2013-03-10 (×3): via INTRAVENOUS

## 2013-03-10 MED ORDER — SENNA 8.6 MG PO TABS
1.0000 | ORAL_TABLET | Freq: Two times a day (BID) | ORAL | Status: DC
  Administered 2013-03-11 – 2013-03-16 (×11): 8.6 mg via ORAL
  Filled 2013-03-10 (×12): qty 1

## 2013-03-10 MED ORDER — BUPROPION HCL ER (SR) 150 MG PO TB12
150.0000 mg | ORAL_TABLET | Freq: Two times a day (BID) | ORAL | Status: DC
  Administered 2013-03-10 – 2013-03-16 (×12): 150 mg via ORAL
  Filled 2013-03-10 (×13): qty 1

## 2013-03-10 MED ORDER — LIDOCAINE-EPINEPHRINE 0.5 %-1:200000 IJ SOLN
INTRAMUSCULAR | Status: DC | PRN
  Administered 2013-03-10: 50 mL

## 2013-03-10 MED ORDER — GLYCOPYRROLATE 0.2 MG/ML IJ SOLN
INTRAMUSCULAR | Status: DC | PRN
  Administered 2013-03-10: .2 mg via INTRAVENOUS
  Administered 2013-03-10: .6 mg via INTRAVENOUS

## 2013-03-10 MED ORDER — OXYCODONE HCL 5 MG PO TABS: 5.0000 mg | ORAL_TABLET | Freq: Once | ORAL | Status: DC | PRN

## 2013-03-10 MED ORDER — 0.9 % SODIUM CHLORIDE (POUR BTL) OPTIME
TOPICAL | Status: DC | PRN
  Administered 2013-03-10 (×2): 1000 mL

## 2013-03-10 MED ORDER — SUCCINYLCHOLINE CHLORIDE 20 MG/ML IJ SOLN
INTRAMUSCULAR | Status: DC | PRN
  Administered 2013-03-10: 120 mg via INTRAVENOUS

## 2013-03-10 MED ORDER — OXYCODONE-ACETAMINOPHEN 5-325 MG PO TABS
1.0000 | ORAL_TABLET | ORAL | Status: DC | PRN
  Administered 2013-03-11 – 2013-03-14 (×14): 2 via ORAL
  Administered 2013-03-14: 1 via ORAL
  Administered 2013-03-15 – 2013-03-16 (×7): 2 via ORAL
  Filled 2013-03-10 (×2): qty 2
  Filled 2013-03-10: qty 1
  Filled 2013-03-10 (×20): qty 2

## 2013-03-10 MED ORDER — HYDROMORPHONE HCL PF 1 MG/ML IJ SOLN
INTRAMUSCULAR | Status: AC
  Filled 2013-03-10: qty 1

## 2013-03-10 MED ORDER — ARIPIPRAZOLE 5 MG PO TABS
5.0000 mg | ORAL_TABLET | Freq: Every day | ORAL | Status: DC
  Administered 2013-03-11 – 2013-03-16 (×6): 5 mg via ORAL
  Filled 2013-03-10 (×6): qty 1

## 2013-03-10 MED ORDER — LISINOPRIL 10 MG PO TABS
10.0000 mg | ORAL_TABLET | Freq: Every day | ORAL | Status: DC
  Administered 2013-03-12 – 2013-03-14 (×3): 10 mg via ORAL
  Filled 2013-03-10 (×6): qty 1

## 2013-03-10 MED ORDER — LISINOPRIL-HYDROCHLOROTHIAZIDE 10-12.5 MG PO TABS: 1.0000 | ORAL_TABLET | Freq: Every day | ORAL | Status: DC

## 2013-03-10 MED ORDER — NEOSTIGMINE METHYLSULFATE 1 MG/ML IJ SOLN
INTRAMUSCULAR | Status: DC | PRN
  Administered 2013-03-10: 4 mg via INTRAVENOUS

## 2013-03-10 MED ORDER — ONDANSETRON HCL 4 MG/2ML IJ SOLN: 4.0000 mg | INTRAMUSCULAR | Status: DC | PRN

## 2013-03-10 MED ORDER — ACETAMINOPHEN 650 MG RE SUPP: 650.0000 mg | RECTAL | Status: DC | PRN

## 2013-03-10 MED ORDER — SODIUM CHLORIDE 0.9 % IJ SOLN
3.0000 mL | Freq: Two times a day (BID) | INTRAMUSCULAR | Status: DC
  Administered 2013-03-11 – 2013-03-13 (×4): 3 mL via INTRAVENOUS

## 2013-03-10 MED ORDER — ROCURONIUM BROMIDE 100 MG/10ML IV SOLN
INTRAVENOUS | Status: DC | PRN
  Administered 2013-03-10 (×3): 10 mg via INTRAVENOUS
  Administered 2013-03-10: 20 mg via INTRAVENOUS
  Administered 2013-03-10: 50 mg via INTRAVENOUS

## 2013-03-10 MED ORDER — ADULT MULTIVITAMIN W/MINERALS CH
1.0000 | ORAL_TABLET | Freq: Every day | ORAL | Status: DC
  Administered 2013-03-11 – 2013-03-16 (×6): 1 via ORAL
  Filled 2013-03-10 (×6): qty 1

## 2013-03-10 MED ORDER — CLOTRIMAZOLE 1 % EX CREA
1.0000 "application " | TOPICAL_CREAM | Freq: Two times a day (BID) | CUTANEOUS | Status: DC
  Administered 2013-03-10 – 2013-03-15 (×7): 1 via TOPICAL
  Filled 2013-03-10: qty 15

## 2013-03-10 MED ORDER — ALBUMIN HUMAN 5 % IV SOLN
INTRAVENOUS | Status: DC | PRN
  Administered 2013-03-10 (×2): via INTRAVENOUS

## 2013-03-10 MED ORDER — LACTATED RINGERS IV SOLN
INTRAVENOUS | Status: DC
  Administered 2013-03-10: 09:00:00 via INTRAVENOUS

## 2013-03-10 MED ORDER — MENTHOL 3 MG MT LOZG: 1.0000 | LOZENGE | OROMUCOSAL | Status: DC | PRN

## 2013-03-10 MED ORDER — ACETAMINOPHEN 325 MG PO TABS: 650.0000 mg | ORAL_TABLET | ORAL | Status: DC | PRN

## 2013-03-10 SURGICAL SUPPLY — 67 items
BENZOIN TINCTURE PRP APPL 2/3 (GAUZE/BANDAGES/DRESSINGS) ×2 IMPLANT
BLADE SURG ROTATE 9660 (MISCELLANEOUS) IMPLANT
BONE MATRIX OSTEOCEL PLUS 5CC (Bone Implant) ×2 IMPLANT
BUR MATCHSTICK NEURO 3.0 LAGG (BURR) ×2 IMPLANT
CAGE COROENT MP 12X9X23-4 (Cage) ×4 IMPLANT
CANISTER SUCTION 2500CC (MISCELLANEOUS) ×2 IMPLANT
CLOTH BEACON ORANGE TIMEOUT ST (SAFETY) ×2 IMPLANT
CONT SPEC 4OZ CLIKSEAL STRL BL (MISCELLANEOUS) ×2 IMPLANT
COVER BACK TABLE 24X17X13 BIG (DRAPES) IMPLANT
DECANTER SPIKE VIAL GLASS SM (MISCELLANEOUS) ×2 IMPLANT
DERMABOND ADVANCED (GAUZE/BANDAGES/DRESSINGS) ×1
DERMABOND ADVANCED .7 DNX12 (GAUZE/BANDAGES/DRESSINGS) ×1 IMPLANT
DRAPE C-ARM 42X72 X-RAY (DRAPES) ×4 IMPLANT
DRAPE C-ARMOR (DRAPES) ×2 IMPLANT
DRAPE LAPAROTOMY 100X72X124 (DRAPES) ×2 IMPLANT
DRAPE POUCH INSTRU U-SHP 10X18 (DRAPES) ×2 IMPLANT
DRAPE SURG 17X23 STRL (DRAPES) ×2 IMPLANT
DRESSING TELFA 8X3 (GAUZE/BANDAGES/DRESSINGS) IMPLANT
DRSG OPSITE POSTOP 4X6 (GAUZE/BANDAGES/DRESSINGS) ×2 IMPLANT
DURAPREP 26ML APPLICATOR (WOUND CARE) ×2 IMPLANT
ELECT REM PT RETURN 9FT ADLT (ELECTROSURGICAL) ×2
ELECTRODE REM PT RTRN 9FT ADLT (ELECTROSURGICAL) ×1 IMPLANT
GAUZE SPONGE 4X4 16PLY XRAY LF (GAUZE/BANDAGES/DRESSINGS) ×2 IMPLANT
GLOVE BIO SURGEON STRL SZ7 (GLOVE) ×2 IMPLANT
GLOVE BIO SURGEON STRL SZ8 (GLOVE) ×2 IMPLANT
GLOVE BIOGEL PI IND STRL 8.5 (GLOVE) ×1 IMPLANT
GLOVE BIOGEL PI INDICATOR 8.5 (GLOVE) ×1
GLOVE ECLIPSE 6.5 STRL STRAW (GLOVE) ×4 IMPLANT
GLOVE ECLIPSE 7.5 STRL STRAW (GLOVE) ×8 IMPLANT
GLOVE EXAM NITRILE LRG STRL (GLOVE) ×2 IMPLANT
GLOVE EXAM NITRILE MD LF STRL (GLOVE) IMPLANT
GLOVE EXAM NITRILE XL STR (GLOVE) IMPLANT
GLOVE EXAM NITRILE XS STR PU (GLOVE) IMPLANT
GLOVE INDICATOR 7.5 STRL GRN (GLOVE) ×4 IMPLANT
GLOVE INDICATOR 8.0 STRL GRN (GLOVE) ×4 IMPLANT
GOWN BRE IMP SLV AUR LG STRL (GOWN DISPOSABLE) ×6 IMPLANT
GOWN BRE IMP SLV AUR XL STRL (GOWN DISPOSABLE) ×4 IMPLANT
GOWN STRL REIN 2XL LVL4 (GOWN DISPOSABLE) ×2 IMPLANT
KIT BASIN OR (CUSTOM PROCEDURE TRAY) ×4 IMPLANT
KIT POSITION SURG JACKSON T1 (MISCELLANEOUS) ×2 IMPLANT
KIT ROOM TURNOVER OR (KITS) ×2 IMPLANT
MILL MEDIUM DISP (BLADE) ×2 IMPLANT
NEEDLE HYPO 25X1 1.5 SAFETY (NEEDLE) ×2 IMPLANT
NEEDLE SPNL 18GX3.5 QUINCKE PK (NEEDLE) IMPLANT
NS IRRIG 1000ML POUR BTL (IV SOLUTION) ×4 IMPLANT
PACK LAMINECTOMY NEURO (CUSTOM PROCEDURE TRAY) ×2 IMPLANT
PAD ARMBOARD 7.5X6 YLW CONV (MISCELLANEOUS) ×4 IMPLANT
ROD 5.5X40MM (Rod) ×2 IMPLANT
ROD 50MM LUMBAR (Rod) ×2 IMPLANT
SCREW LOCK (Screw) ×4 IMPLANT
SCREW LOCK FXNS SPNE MAS PL (Screw) ×4 IMPLANT
SCREW SHANK 5.0X35 (Screw) ×8 IMPLANT
SCREW TULIP 5.5 (Screw) ×8 IMPLANT
SPONGE GAUZE 4X4 12PLY (GAUZE/BANDAGES/DRESSINGS) IMPLANT
SPONGE LAP 4X18 X RAY DECT (DISPOSABLE) IMPLANT
SPONGE SURGIFOAM ABS GEL 100 (HEMOSTASIS) ×2 IMPLANT
STRIP CLOSURE SKIN 1/2X4 (GAUZE/BANDAGES/DRESSINGS) IMPLANT
SUT PROLENE 6 0 BV (SUTURE) IMPLANT
SUT VIC AB 0 CT1 18XCR BRD8 (SUTURE) ×2 IMPLANT
SUT VIC AB 0 CT1 8-18 (SUTURE) ×2
SUT VIC AB 2-0 CT1 18 (SUTURE) ×2 IMPLANT
SUT VIC AB 3-0 SH 8-18 (SUTURE) ×4 IMPLANT
SYR 20ML ECCENTRIC (SYRINGE) ×2 IMPLANT
TOWEL OR 17X24 6PK STRL BLUE (TOWEL DISPOSABLE) ×2 IMPLANT
TOWEL OR 17X26 10 PK STRL BLUE (TOWEL DISPOSABLE) ×2 IMPLANT
TRAY FOLEY CATH 14FRSI W/METER (CATHETERS) ×2 IMPLANT
WATER STERILE IRR 1000ML POUR (IV SOLUTION) ×2 IMPLANT

## 2013-03-10 NOTE — Preoperative (Signed)
Beta Blockers   Reason not to administer Beta Blockers:Not Applicable 

## 2013-03-10 NOTE — Anesthesia Procedure Notes (Signed)
Procedure Name: Intubation Date/Time: 03/10/2013 3:46 PM Performed by: Ned Grace Pre-anesthesia Checklist: Patient identified, Timeout performed, Emergency Drugs available, Suction available and Patient being monitored Patient Re-evaluated:Patient Re-evaluated prior to inductionOxygen Delivery Method: Circle system utilized Preoxygenation: Pre-oxygenation with 100% oxygen Intubation Type: IV induction Ventilation: Mask ventilation with difficulty, Two handed mask ventilation required and Oral airway inserted - appropriate to patient size Laryngoscope size: (Glidescope) Grade View: Grade II Tube type: Oral Tube size: 7.5 mm Number of attempts: 1 Airway Equipment and Method: Video-laryngoscopy,  Rigid stylet and Oral airway Placement Confirmation: ETT inserted through vocal cords under direct vision,  positive ETCO2 and breath sounds checked- equal and bilateral Secured at: 23 cm Tube secured with: Tape Dental Injury: Teeth and Oropharynx as per pre-operative assessment  Future Recommendations: Recommend- induction with short-acting agent, and alternative techniques readily available

## 2013-03-10 NOTE — Progress Notes (Signed)
Orthopedic Tech Progress Note Patient Details:  Diane Gilbert 13-Mar-1966 [id number] Brace order completed by bio-tech vendor Patient ID: [name], female   DOB: 10/06/1965, 47 y.o.   MRN: [id number]   Braulio Bosch 03/10/2013, 8:26 PM

## 2013-03-10 NOTE — Op Note (Signed)
03/10/2013  7:43 PM  PATIENT:  [name]  47 y.o. 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 female whom was taken to the operating room intubated, and placed under a general anesthetic without difficulty. A foley catheter was placed under sterile conditions. She was positioned prone on a Jackson stable with all pressure points properly padded.  the lumbar region was prepped and draped in a sterile manner. I infiltrated 10cc's 1/2%lidocaine/1:2000,000 strength epinephrine into the planned incision. I opened the skin with a 10 blade and took the incision down to the thoracolumbar fascia. I exposed the lamina of L3 and L4 in a subperiosteal fashion bilaterally. I confirmed my location with an intraoperative xray.  I placed self retaining retractors and started the decompression.  I decompressed the spinal canal via a complete laminectomy of L3 and partial laminectomy of L4. We exposed the disc space bilaterally then opened it with a 15 blade. We proceeded with the discetomies bilaterally to decompress the canal. We also performed aggressive facetectomies of the inferior facets of L3 to decompress the L3 roots, and the L4 roots beyond what was necessary to perform a PLIF at that level. After the decompression Dr. Vertell Limber and I emptied the disc space with a variety  of tools to remove the soft tissue and endplates and to leave a suitable bed for the cages. We placed 74m x233mcages into the disc space after packing bone anterior to the cages bilaterally. The cages were in good position.  I then placed the pedicle screws in a medial to lateral trajectory. For each screw I used fluoroscopic guidance. I drilled a pilot hole, then drilled through the pedicle, then tapped eAch pedicle, then finally placed a [id number] in each of the 4 pedicles. I inspected each pedicle and did not appreciate a breach in any of them. We connected the rods to the screw heads, then locked them in place with screw caps.  For the posterolateral arthrodesis I decorticated the lateral portion of the remaining bone and placed morselized bone on the bone.  I irrigated copiously then closed the wound in layers approximating the thoracolumbar fascia, subcutaneous, and subcuticular planes.  I applied dermabond for a sterile dressing.   PLAN OF CARE: Admit to inpatient   PATIENT DISPOSITION:  PACU - hemodynamically stable.   Delay start of Pharmacological VTE agent (>24hrs) due to surgical blood loss or risk of bleeding:  yes

## 2013-03-10 NOTE — H&P (Signed)
BP 142/73  Pulse 88  Temp(Src) 98.2 F (36.8 C)  Resp 24         [name] is a 47 year old woman who presents today for evaluation of pain, which she has in the lower back.  She says she has had this pain for possibly a year, but actually has to be more than that because she says it has gotten worse over the last year.  The pain is in the lower back and both lower extremities equally.  She states it is far worse whenever she has to stand or walk.  She has not had treatment.  She has tried pain medication and says that did not help.  For example, if she goes to grocery shopping, she cannot do that without having to stop four or five times.  She has had no weakness nor has she had any bowel or bladder dysfunction.  She does not remember any antecedent trauma.  A year ago, this caused the exacerbation.  She is right handed.   On her pain chart, she lists pain in the right hand and pain down both lower extremities anteriorly and posteriorly.   REVIEW OF SYSTEMS:                                    Positive for hypertension, leg pain with walking, back pain, arm pain, leg pain, arthritis, and depression.  Denies Constitutional, Eyes, Ears, Nose, Throat or Mouth, Respiratory, Gastrointestinal, Genitourinary, Skin, Neurologic, Endocrine, Hematologic, and Allergic problems.   PAST MEDICAL HISTORY:  Includes hypertension.            Prior Operations:  She has had no previous operations.            Medications and Allergies:  She has no known drug allergies.  Medications are Abilify, gabapentin, bupropion, quetiapine fumarate, oxycodone, tizanidine, lisinopril, and Voltaren 75 mg.   FAMILY HISTORY:                                            She provided no family history.   SOCIAL HISTORY:                                            She does smoke pack of cigarettes a day.  She does not use alcohol.  She does not use illicit drugs.   PHYSICAL EXAMINATION:                                She  is 64 inches in height and weighs 228 pounds.  BMI is 39.14.  Blood pressure is 143/84, pulse is 97, and respiratory rate is 18.  On examination, she is alert, oriented x4 and answering all questions appropriately.  Memory, language, attention span, and fund of knowledge are normal.  She has 5/5 strength in both upper and lower extremities.  Reflexes not elicited in the lower extremities at the knees or ankles.  Normal reflexes 2+ in the upper extremities bilaterally.  She has normal muscle tone, bulk, and coordination.  Her speech is clear and fluent.  Pupils are equal, round, and reactive  to light.  Full extraocular movements.  Full visual fields.  Hearing intact to finger rub bilaterally.  Normal muscle tone, bulk, and coordination.  Romberg test is negative.  She can toe walk, heel walk, and take a 14-inch step, but she has great deal of difficulties with a step on her left side.   IMAGING STUDIES:                                          MRI is reviewed and shows moderate lumbar stenosis, but significant facet arthropathy at L3-4 and lateral recess stenosis bilaterally at that level.  She is mildly closed off in the lateral recess at L4-5, but nothing like the radiologist says.  It is worse at L3-4 and he talks about severe lateral recess stenosis L4-5, which I disagree with.  Conus is normal.  Cauda equina is normal.  The discs are degenerated at L3-4 and L4-5.  Alignment is otherwise seemingly normal, but  plain films do show movement between  flexion and extension views secondary to this facet arthropathy.  CT of the abdomen done in 2012 shows air in the joint at L3-4, so this is something, which has been present and has been getting worse.  I think that is why her stenosis is causing her problems.  She certainly is experiencing symptoms consistent with neurogenic claudication.  I think she needs an operation to get better.  Epidural steroids have recently been shown to do very little in terms of lumbar  stenosis and given that, I think the best thing that we can do for her is to provide decompression and subsequent arthrodesis.  Given that, I understand that she needs to think about it.  She would like to go forward with the decompression and fusion risks being fusion failure, hardware failure, no pain relief, nerve damage, bowel/bladder dysfunction,need for more surgery.

## 2013-03-10 NOTE — Transfer of Care (Signed)
Immediate Anesthesia Transfer of Care Note  Patient: AMANDY CHUBBUCK  Procedure(s) Performed: Procedure(s) with comments: Lumbar Three Four decompression with posterior lumbar interbody fusion posterior lateral arthrodesis and posterior nonsegmental instrumentation (N/A) - POSTERIOR LUMBAR FUSION 1 LEVEL  Patient Location: PACU  Anesthesia Type:General  Level of Consciousness: awake, sedated and patient cooperative  Airway & Oxygen Therapy: Patient Spontanous Breathing and Patient connected to face mask oxygen  Post-op Assessment: Report given to PACU RN, Post -op Vital signs reviewed and stable and Patient moving all extremities X 4  Post vital signs: Reviewed and stable  Complications: No apparent anesthesia complications

## 2013-03-10 NOTE — Anesthesia Postprocedure Evaluation (Signed)
  Anesthesia Post-op Note  Patient: Diane Gilbert  Procedure(s) Performed: Procedure(s) with comments: Lumbar Three Four decompression with posterior lumbar interbody fusion posterior lateral arthrodesis and posterior nonsegmental instrumentation (N/A) - POSTERIOR LUMBAR FUSION 1 LEVEL  Patient Location: PACU  Anesthesia Type:General  Level of Consciousness: awake, alert , oriented and patient cooperative  Airway and Oxygen Therapy: Patient Spontanous Breathing  Post-op Pain: mild  Post-op Assessment: Post-op Vital signs reviewed, Patient's Cardiovascular Status Stable, Respiratory Function Stable, Patent Airway, No signs of Nausea or vomiting and Pain level controlled  Post-op Vital Signs: stable  Complications: No apparent anesthesia complications

## 2013-03-10 NOTE — Anesthesia Preprocedure Evaluation (Addendum)
Anesthesia Evaluation  Patient identified by MRN, date of birth, ID band Patient awake    Reviewed: Allergy & Precautions, H&P , NPO status , Patient's Chart, lab work & pertinent test results  Airway Mallampati: III TM Distance: >3 FB Neck ROM: Full    Dental no notable dental hx. (+) Teeth Intact and Dental Advisory Given   Pulmonary Current Smoker,  breath sounds clear to auscultation  Pulmonary exam normal       Cardiovascular hypertension, On Medications Rhythm:Regular Rate:Normal     Neuro/Psych  Headaches, PSYCHIATRIC DISORDERS Anxiety Depression    GI/Hepatic negative GI ROS, (+)     substance abuse  alcohol use,   Endo/Other  Morbid obesity  Renal/GU negative Renal ROS  negative genitourinary   Musculoskeletal   Abdominal   Peds  Hematology negative hematology ROS (+)   Anesthesia Other Findings   Reproductive/Obstetrics negative OB ROS                          Anesthesia Physical Anesthesia Plan  ASA: III  Anesthesia Plan: General   Post-op Pain Management:    Induction: Intravenous  Airway Management Planned: Oral ETT  Additional Equipment:   Intra-op Plan:   Post-operative Plan: Extubation in OR  Informed Consent: I have reviewed the patients History and Physical, chart, labs and discussed the procedure including the risks, benefits and alternatives for the proposed anesthesia with the patient or authorized representative who has indicated his/her understanding and acceptance.   Dental advisory given  Plan Discussed with: CRNA  Anesthesia Plan Comments:         Anesthesia Quick Evaluation

## 2013-03-11 ENCOUNTER — Encounter (HOSPITAL_COMMUNITY): Payer: Self-pay | Admitting: *Deleted

## 2013-03-11 MED ORDER — MANAGING BACK PAIN BOOK
Freq: Once | Status: AC
  Administered 2013-03-11: 07:00:00
  Filled 2013-03-11: qty 1

## 2013-03-11 MED ORDER — INFLUENZA VAC SPLIT QUAD 0.5 ML IM SUSP
0.5000 mL | INTRAMUSCULAR | Status: AC
  Administered 2013-03-12: 0.5 mL via INTRAMUSCULAR
  Filled 2013-03-11: qty 0.5

## 2013-03-11 NOTE — Progress Notes (Signed)
No issues overnight. Pt doing well, no report of new numbness/weakness.  EXAM:  BP 119/67  Pulse 93  Temp(Src) 98.5 F (36.9 C) (Oral)  Resp 20  Ht _0  (1.626 m)  Wt 103.8 kg (228 lb 13.4 oz)  BMI 39.26 kg/m2  SpO2 96%  Awake, alert, oriented  Speech fluent, appropriate  CN grossly intact  5/5 BUE/BLE  Wound c/d/i  IMPRESSION:  47 y.o. female POD# 1 s/p L3/4 PLIF Doing well neurologically  PLAN: - Cont routine postop care. - OOB - Decrease IVF, encourage PO intake

## 2013-03-11 NOTE — Evaluation (Signed)
Physical Therapy Evaluation Patient Details Name: Diane Gilbert MRN: [id number] DOB: 11-30-1965 Today's Date: 03/11/2013 Time: 5597-4163 PT Time Calculation (min): 26 min  PT Assessment / Plan / Recommendation History of Present Illness  Patient is a 47 yo female s/p L3-4 PLIF.  Clinical Impression  Patient presents with problems listed below.  Will benefit from acute PT to maximize independence prior to discharge home.  Feel patient will need 24 hour assist initially at discharge.  Patient reports she will have an aide.  Will follow-up at next session.    PT Assessment  Patient needs continued PT services    Follow Up Recommendations  Home health PT;Supervision/Assistance - 24 hour    Does the patient have the potential to tolerate intense rehabilitation      Barriers to Discharge Decreased caregiver support Patient lives alone.  Unsure who the aide is coming from.  Will follow up at next session.    Equipment Recommendations  Rolling walker with 5" wheels;3in1 (PT)    Recommendations for Other Services     Frequency Min 5X/week    Precautions / Restrictions Precautions Precautions: Back Precaution Booklet Issued: Yes (comment) Precaution Comments: Reviewed back precautions with patient. Required Braces or Orthoses: Spinal Brace Spinal Brace: Lumbar corset;Applied in sitting position Restrictions Weight Bearing Restrictions: No   Pertinent Vitals/Pain Pain impacting mobility      Mobility  Bed Mobility Bed Mobility: Rolling Left;Left Sidelying to Sit;Sitting - Scoot to Edge of Bed Rolling Left: 4: Min guard;With rail Left Sidelying to Sit: 4: Min assist;With rails;HOB elevated Sitting - Scoot to Edge of Bed: 4: Min guard Details for Bed Mobility Assistance: Verbal cues for technique to maintain back precautions.  Instructed patient on donning brace in sitting. Transfers Transfers: Sit to Stand;Stand to Sit Sit to Stand: 4: Min assist;With upper extremity  assist;From bed Stand to Sit: 4: Min assist;With upper extremity assist;With armrests;To chair/3-in-1 Details for Transfer Assistance: Verbal cues for hand placement and technique.  Assist to rise to standing and for balance. Ambulation/Gait Ambulation/Gait Assistance: 4: Min assist Ambulation Distance (Feet): 44 Feet Assistive device: Rolling walker Ambulation/Gait Assistance Details: Verbal cues for safe use of RW and gait sequence.  Patient with very stiff guarded gait.  Increased time required. Gait Pattern: Step-through pattern;Decreased stride length;Shuffle;Antalgic Gait velocity: Very slow gait speed    Exercises     PT Diagnosis: Difficulty walking;Generalized weakness;Acute pain  PT Problem List: Decreased strength;Decreased activity tolerance;Decreased balance;Decreased mobility;Decreased knowledge of use of DME;Decreased knowledge of precautions;Pain PT Treatment Interventions: DME instruction;Gait training;Stair training;Functional mobility training;Patient/family education     PT Goals(Current goals can be found in the care plan section) Acute Rehab PT Goals Patient Stated Goal: To go home PT Goal Formulation: With patient Time For Goal Achievement: 03/18/13 Potential to Achieve Goals: Good  Visit Information  Last PT Received On: 03/11/13 Assistance Needed: +1 History of Present Illness: Patient is a 47 yo female s/p L3-4 PLIF.       Prior [place] expects to be discharged to:: Private residence Living Arrangements: Alone Available Help at Discharge: Other (Comment) (Patient reports she is to have an aide ??) Type of Home: Apartment Home Access: Stairs to enter [employer] of Steps: 4 Entrance Stairs-Rails: Right;Left Home Layout: One level Home Equipment: None Prior Function Level of Independence: Independent Communication Communication: No difficulties    Cognition  Cognition Arousal/Alertness:  Awake/alert Behavior During Therapy: WFL for tasks assessed/performed Overall Cognitive Status: Within Functional Limits for  tasks assessed    Extremity/Trunk Assessment Upper Extremity Assessment Upper Extremity Assessment: Overall WFL for tasks assessed Lower Extremity Assessment Lower Extremity Assessment: Generalized weakness   Balance    End of Session PT - End of Session Equipment Utilized During Treatment: Gait belt;Back brace Activity Tolerance: Patient limited by pain;Patient limited by fatigue Patient left: in chair;with call bell/phone within reach Nurse Communication: Mobility status;Patient requests pain meds  GP     Despina Pole 03/11/2013, 6:08 PM Carita Pian. Sanjuana Kava, [place] Pager [phone]

## 2013-03-12 MED ORDER — METHOCARBAMOL 500 MG PO TABS: 1000.0000 mg | ORAL_TABLET | Freq: Three times a day (TID) | ORAL | Status: DC | PRN

## 2013-03-12 NOTE — Evaluation (Addendum)
Occupational Therapy Evaluation Patient Details Name: Diane Gilbert MRN: [id number] DOB: [birth year] Today's Date: 03/12/2013 Time: 3734-2876 OT Time Calculation (min): 21 min  OT Assessment / Plan / Recommendation History of present illness Patient is a 47 yo female s/p L3-4 PLIF.   Clinical Impression    Patient presents with problems listed below. Pt independent with ADLs, PTA. Will benefit from acute OT to maximize independence prior to discharge home. Feel patient will need 24 hour assist initially at discharge. Patient reports she will have an aide but pt unable to state how much time the aide will be available. If pt does not have 24 hour assist, may need to consider ST-SNF for continued therapy.   OT Assessment  Patient needs continued OT Services    Follow Up Recommendations  Home health OT;Supervision/Assistance - 24 hour    Barriers to Discharge      Equipment Recommendations  3 in 1 bedside comode    Recommendations for Other Services    Frequency  Min 2X/week    Precautions / Restrictions Precautions Precautions: Back Precaution Booklet Issued: No Precaution Comments: Pt able to state 3/3 back precautions.  Required Braces or Orthoses: Spinal Brace Spinal Brace: Lumbar corset;Applied in sitting position Restrictions Weight Bearing Restrictions: No   Pertinent Vitals/Pain Pain impacting mobility-reported pain in left hip area. Pt positioned in bed at end of session.      ADL  Grooming: Performed;Wash/dry face;Teeth care;Min guard Where Assessed - Grooming: Supported standing Upper Body Bathing: Minimal assistance Where Assessed - Upper Body Bathing: Unsupported sitting Lower Body Bathing: Maximal assistance Where Assessed - Lower Body Bathing: Supported sit to stand Upper Body Dressing: Minimal assistance Where Assessed - Upper Body Dressing: Unsupported sitting Lower Body Dressing: Maximal assistance Where Assessed - Lower Body Dressing: Supported  sit to stand Toilet Transfer: Magazine features editor Method: Sit to Loss adjuster, chartered: Radiographer, therapeutic Method: Not assessed Equipment Used: Back brace;Rolling walker Transfers/Ambulation Related to ADLs: Min guard for ambulation and transfers-cues for precautions. ADL Comments: Pt performed grooming at sink and OT educated on use of 2 cups for teeth care. Pt also washed face- cues for precautions during grooming tasks. Pt unable to cross legs over knees for LB dressing. OT educated on dressing technique and explained AE is available to assist with this. OT also educated on use of toilet aid to assist with hygiene.  Pt donned/doffed back brace at setup/supervision level with cues to be sure to maintain precautions.    OT Diagnosis: Acute pain  OT Problem List: Decreased range of motion;Decreased strength;Decreased activity tolerance;Decreased knowledge of use of DME or AE;Decreased knowledge of precautions;Pain OT Treatment Interventions: Self-care/ADL training;DME and/or AE instruction;Therapeutic activities;Patient/family education;Balance training   OT Goals(Current goals can be found in the care plan section) Acute Rehab OT Goals Patient Stated Goal: To get better OT Goal Formulation: With patient Time For Goal Achievement: 03/19/13 Potential to Achieve Goals: Good ADL Goals Pt Will Perform Grooming: with modified independence;standing Pt Will Perform Upper Body Bathing: with modified independence;sitting Pt Will Perform Lower Body Bathing: with min guard assist;with adaptive equipment;sit to/from stand Pt Will Perform Upper Body Dressing: with modified independence;sitting Pt Will Perform Lower Body Dressing: with min guard assist;with adaptive equipment;sit to/from stand Pt Will Transfer to Toilet: with modified independence;ambulating (3 in 1 over commode) Pt Will Perform Toileting - Clothing Manipulation and hygiene: with modified  independence;with adaptive equipment;sit to/from stand Additional ADL Goal #1: Pt will be able to  don/doff back brace independently while maintaining back precautions.   Visit Information  Last OT Received On: 03/12/13 Assistance Needed: +1 PT/OT Co-Evaluation/Treatment: Yes (partly) History of Present Illness: Patient is a 47 yo female s/p L3-4 PLIF.       Prior [place] expects to be discharged to:: Private residence Living Arrangements: Alone Available Help at Discharge: Other (Comment) Type of Home: Apartment Home Access: Stairs to enter Entrance Stairs-Number of Steps: 4 Entrance Stairs-Rails: Right;Left Home Layout: One level Home Equipment: None Prior Function Level of Independence: Independent Communication Communication: No difficulties         Vision/Perception     Cognition  Cognition Arousal/Alertness: Awake/alert Behavior During Therapy: WFL for tasks assessed/performed Overall Cognitive Status: Within Functional Limits for tasks assessed    Extremity/Trunk Assessment Upper Extremity Assessment Upper Extremity Assessment: Overall WFL for tasks assessed     Mobility Bed Mobility Bed Mobility: Rolling Right;Right Sidelying to Sit;Sit to Sidelying Right;Rolling Left;Sitting - Scoot to Edge of Bed Rolling Right: 4: Min guard Rolling Left: 4: Min guard Right Sidelying to Sit: 4: Min guard;HOB elevated Sitting - Scoot to [employer] of Bed: 4: Min guard Sit to Sidelying Right: 4: Min assist Details for Bed Mobility Assistance: Verbal cues to maintain back precautions. Assist required to bring LE's onto bed when returning to sidelying. Transfers Transfers: Sit to Stand;Stand to Sit Sit to Stand: 4: Min guard;With upper extremity assist;From bed;From chair/3-in-1 Stand to Sit: 4: Min guard;With upper extremity assist;To bed;To chair/3-in-1 Details for Transfer Assistance: Cues for hand placement     Exercise     Balance      End of Session OT - End of Session Equipment Utilized During Treatment: Rolling walker;Back brace Activity Tolerance: Patient limited by pain;Patient limited by fatigue Patient left: in bed;with call bell/phone within reach;with bed alarm set  [place], [place] L OTR/L [phone] 03/12/2013, 5:49 PM

## 2013-03-12 NOTE — Progress Notes (Signed)
Physical Therapy Treatment Patient Details Name: Diane Gilbert MRN: [id number] DOB: 1965/07/23 Today's Date: 03/12/2013 Time: 5188-4166 PT Time Calculation (min): 26 min  PT Assessment / Plan / Recommendation  History of Present Illness Patient is a 47 yo female s/p L3-4 PLIF.   PT Comments   Patient making improvement with mobility and gait.  Pain continues to limit mobility.  Patient reports she is to receive aide from [facility] for home assistance.  Patient is unable to state how much time the aide will be available.  If patient does not have 24 hour assist, may need to consider ST-SNF for continued therapy.  Follow Up Recommendations  Home health PT;Supervision/Assistance - 24 hour (if not available, may need SNF)     Does the patient have the potential to tolerate intense rehabilitation     Barriers to Discharge        Equipment Recommendations  Rolling walker with 5" wheels;3in1 (PT)    Recommendations for Other Services    Frequency Min 5X/week   Progress towards PT Goals Progress towards PT goals: Progressing toward goals  Plan Current plan remains appropriate    Precautions / Restrictions Precautions Precautions: Back Precaution Booklet Issued: No Precaution Comments: Patient able to state 3/3 back precautions.  Needs reminders to follow them during mobility. Required Braces or Orthoses: Spinal Brace Spinal Brace: Lumbar corset;Applied in sitting position Restrictions Weight Bearing Restrictions: No   Pertinent Vitals/Pain Pain continues to limit mobility.    Mobility  Bed Mobility Bed Mobility: Rolling Right;Right Sidelying to Sit;Sit to Sidelying Right;Rolling Left Rolling Right: 4: Min guard Rolling Left: 4: Min guard Right Sidelying to Sit: 4: Min guard;HOB elevated Sitting - Scoot to Edge of Bed: 4: Min guard Sit to Sidelying Right: 4: Min assist;HOB elevated Details for Bed Mobility Assistance: Verbal cues to maintain back precautions.  Assist required  to bring LE's onto bed when returning to sidelying. Transfers Transfers: Sit to Stand;Stand to Sit Sit to Stand: 4: Min guard;With upper extremity assist;From bed;From chair/3-in-1 Stand to Sit: 4: Min guard;With upper extremity assist;To bed;To chair/3-in-1 Details for Transfer Assistance: Cues for hand placement Ambulation/Gait Ambulation/Gait Assistance: 4: Min assist Ambulation Distance (Feet): 92 Feet Assistive device: Rolling walker Ambulation/Gait Assistance Details: Verbal cues for safe use of RW, especially during turns.  Cues to remain upright. Gait Pattern: Step-through pattern;Decreased stride length;Antalgic Gait velocity: Slow speed, but improved from last session.    Exercises     PT Diagnosis:    PT Problem List:   PT Treatment Interventions:     PT Goals (current goals can now be found in the care plan section) Acute Rehab PT Goals Patient Stated Goal: To get better  Visit Information  Last PT Received On: 03/12/13 Assistance Needed: +1 PT/OT Co-Evaluation/Treatment: Yes History of Present Illness: Patient is a 47 yo female s/p L3-4 PLIF.    Subjective Data  Subjective: My back is spasming Patient Stated Goal: To get better   Cognition  Cognition Arousal/Alertness: Awake/alert Behavior During Therapy: WFL for tasks assessed/performed Overall Cognitive Status: Within Functional Limits for tasks assessed    Balance     End of Session PT - End of Session Equipment Utilized During Treatment: Gait belt;Back brace Activity Tolerance: Patient limited by pain;Patient limited by fatigue Patient left: in bed;with call bell/phone within reach Nurse Communication: Mobility status;Patient requests pain meds   GP     Despina Pole 03/12/2013, 4:34 PM Carita Pian. Sanjuana Kava, [place] Pager [phone]

## 2013-03-12 NOTE — Progress Notes (Signed)
Patient ID: Diane Gilbert, female   DOB: 1966/06/03, 47 y.o.   MRN: [id number] BP 121/61  Pulse 94  Temp(Src) 98.8 F (37.1 C) (Oral)  Resp 20  Ht 5' 4" (1.626 m)  Wt 103.8 kg (228 lb 13.4 oz)  BMI 39.26 kg/m2  SpO2 97% Alert and oriented x 4 Moving all extremities well Wound is clean, dry, no signs of infection Continue with pt, ot Possible discharge on tuesday

## 2013-03-13 LAB — POCT I-STAT 4, (NA,K, GLUC, HGB,HCT)
Glucose, Bld: 95 mg/dL (ref 70–99)
Potassium: 4.2 mEq/L (ref 3.5–5.1)
Sodium: 139 mEq/L (ref 135–145)

## 2013-03-13 MED ORDER — DIAZEPAM 5 MG PO TABS
5.0000 mg | ORAL_TABLET | Freq: Four times a day (QID) | ORAL | Status: DC | PRN
  Administered 2013-03-13 – 2013-03-16 (×8): 5 mg via ORAL
  Filled 2013-03-13 (×8): qty 1

## 2013-03-13 MED ORDER — METHOCARBAMOL 500 MG PO TABS
1000.0000 mg | ORAL_TABLET | Freq: Three times a day (TID) | ORAL | Status: DC | PRN
  Administered 2013-03-13 – 2013-03-16 (×5): 1000 mg via ORAL
  Filled 2013-03-13 (×5): qty 2

## 2013-03-13 NOTE — Clinical Social Work Placement (Signed)
Clinical Social Work Department CLINICAL SOCIAL WORK PLACEMENT NOTE 03/13/2013  Patient:  Diane Gilbert, Diane Gilbert  Account Number:  [id number] Admit date:  03/10/2013  Clinical Social Worker:  Raquel Sarna [place], LCSWA  Date/time:  03/13/2013 02:52 PM  Clinical Social Work is seeking post-discharge placement for this patient at the following level of care:   SKILLED NURSING   (*CSW will update this form in Epic as items are completed)   03/13/2013  Patient/family provided with [facility] Department of Clinical Social Works list of facilities offering this level of care within the geographic area requested by the patient (or if unable, by the patients family).  03/13/2013  Patient/family informed of their freedom to choose among providers that offer the needed level of care, that participate in Medicare, Medicaid or managed care program needed by the patient, have an available bed and are willing to accept the patient.  03/13/2013  Patient/family informed of [facility] ownership interest in [facility], as well as of the fact that they are under no obligation to receive care at this facility.  PASARR submitted to [employer] on 03/13/2013 PASARR number received from [employer] on 03/13/2013  FL2 transmitted to all facilities in geographic area requested by pt/family on  03/13/2013 FL2 transmitted to all facilities within larger geographic area on   Patient informed that his/her managed care company has contracts with or will negotiate with  certain facilities, including the following:     Patient/family informed of bed offers received:   Patient chooses bed at  Physician recommends and patient chooses bed at    Patient to be transferred to  on   Patient to be transferred to facility by   The following physician request were entered in Epic:   Additional Comments:  Pati Gallo, MSW, [place] [facility] [phone]

## 2013-03-13 NOTE — Progress Notes (Signed)
Physical Therapy Treatment Patient Details Name: Diane Gilbert MRN: [id number] DOB: 09-Aug-1965 Today's Date: 03/13/2013 Time: 4327-6147 PT Time Calculation (min): 26 min  PT Assessment / Plan / Recommendation  History of Present Illness Patient is a 47 yo female s/p L3-4 PLIF.   PT Comments   Pt eager to improve. Requires instructions for maintaining back precautions and for safe use of DME. Currently unclear if pt will have 24/7 assist on discharge. Noted and appreciate Social Work/Care Management involvement.    Follow Up Recommendations  Home health PT;Supervision/Assistance - 24 hour;Other (comment) (unclear if 24 hr assist is available)     Does the patient have the potential to tolerate intense rehabilitation     Barriers to Discharge        Equipment Recommendations  Rolling walker with 5" wheels;3in1 (PT)    Recommendations for Other Services    Frequency Min 5X/week   Progress towards PT Goals Progress towards PT goals: Progressing toward goals  Plan Current plan remains appropriate    Precautions / Restrictions Precautions Precautions: Back Precaution Booklet Issued: Yes (comment) Precaution Comments: Pt able to describe 3/3 back precautions (using different terminology, however correct) Required Braces or Orthoses: Spinal Brace Spinal Brace: Lumbar corset;Applied in sitting position   Pertinent Vitals/Pain 6/10 low back pre-activity; 8/10 low back and buttocks/hips after activity; RN provided medication to assist with pain control; patient repositioned for comfort     Mobility  Bed Mobility Bed Mobility: Rolling Right;Right Sidelying to Sit;Sitting - Scoot to Edge of Bed;Sit to Sidelying Right;Rolling Left Rolling Right: 5: Supervision;With rail Rolling Left: 5: Supervision Right Sidelying to Sit: 5: Supervision;With rails;HOB flat Sitting - Scoot to Edge of Bed: 5: Supervision Sit to Sidelying Right: 4: Min assist;With rail;HOB flat Details for Bed  Mobility Assistance: vc to maintain back precautions and for sequencing/technique; assist to raise LLE onto bed Transfers Transfers: Sit to Stand;Stand to Sit Sit to Stand: 4: Min guard;With upper extremity assist Stand to Sit: 4: Min guard;With upper extremity assist Details for Transfer Assistance: vc for safe, proper use of RW Ambulation/Gait Ambulation/Gait Assistance: 4: Min guard Ambulation Distance (Feet): 180 Feet Assistive device: Rolling walker Ambulation/Gait Assistance Details: vc for safe, proper use of RW and upright posture; RW adjusted downward to better fit pt  Gait Pattern: Step-through pattern;Decreased stride length;Antalgic Stairs: No (discussed #steps and how she did PTA (twisted))    Exercises     PT Diagnosis:    PT Problem List:   PT Treatment Interventions:     PT Goals (current goals can now be found in the care plan section)    Visit Information  Last PT Received On: 03/13/13 Assistance Needed: +1 History of Present Illness: Patient is a 47 yo female s/p L3-4 PLIF.    Subjective Data      Cognition  Cognition Arousal/Alertness: Awake/alert Behavior During Therapy: WFL for tasks assessed/performed Overall Cognitive Status: Within Functional Limits for tasks assessed    Balance     End of Session PT - End of Session Equipment Utilized During Treatment: Back brace Activity Tolerance: Patient limited by pain Patient left: in bed;with call bell/phone within reach;with bed alarm set Nurse Communication: Patient requests pain meds   GP     Shametra [place] 03/13/2013, 3:14 PM Pager [phone]

## 2013-03-13 NOTE — Clinical Social Work Note (Signed)
CSW received a consult for SNF placement for pt upon medical discharge from [facility]. Pt is being recommended for home health PT/OT, at this time.   CSW signing off. Please re-consult if discharge disposition changes.  Pati Gallo, MSW, [place] [facility] [phone]

## 2013-03-13 NOTE — Progress Notes (Addendum)
Occupational Therapy Treatment Patient Details Name: Diane Gilbert MRN: [id number] DOB: [birth year] Today's Date: 03/13/2013 Time: 1660-6301 OT Time Calculation (min): 28 min  OT Assessment / Plan / Recommendation  History of present illness Patient is a 47 yo female s/p L3-4 PLIF.   OT comments  Pt progressing. Practiced with AE for LB dressing. Pt performed toileting tasks as well as grooming. If pt does not have 24/7 assist, recommending SNF.   Follow Up Recommendations  Home health OT;Supervision/Assistance - 24 hour    Barriers to Discharge       Equipment Recommendations  3 in 1 bedside comode    Recommendations for Other Services    Frequency Min 2X/week   Progress towards OT Goals Progress towards OT goals: Progressing toward goals  Plan Discharge plan remains appropriate    Precautions / Restrictions Precautions Precautions: Back Precaution Booklet Issued: No Precaution Comments: Reviewed back precautions with pt. Required Braces or Orthoses: Spinal Brace Spinal Brace: Lumbar corset;Applied in sitting position Restrictions Weight Bearing Restrictions: No   Pertinent Vitals/Pain Pain 6/10. Repositioned and nurse notified. O2 on RA at end of session was ranging from 70 to the 80's. Educated on deep breathing. Placed back on O2 and trended up. Nurse notified.     ADL  Grooming: Performed;Wash/dry face;Teeth care;Supervision/safety;Set up Where Assessed - Grooming: Supported standing Upper Body Dressing: Supervision/safety;Set up Where Assessed - Upper Body Dressing: Unsupported sitting Lower Body Dressing: Min guard Where Assessed - Lower Body Dressing: Supported sit to stand Toilet Transfer: Magazine features editor Method: Sit to Loss adjuster, chartered: Raised toilet seat with arms (or 3-in-1 over toilet) Toileting - Clothing Manipulation and Hygiene: Maximal assistance Where Assessed - Best boy and Hygiene: Sit to stand  from 3-in-1 or toilet Equipment Used: Gait belt;Back brace;Rolling walker;Reacher;Long-handled sponge;Long-handled shoe horn;Sock aid Transfers/Ambulation Related to ADLs: Min guard for ambulation and transfers. ADL Comments: Pt performed toileting tasks-unable to reach for hygiene and OT also helped adjust pad after donning panties in room, so overall Max A for clothing manipulation/hygiene. OT educated on AE for LB ADLs, including toilet aid, and pt practiced with reacher and sockaid. Pt performed grooming tasks at sink. Cues for precautions during session.     OT Diagnosis:    OT Problem List:   OT Treatment Interventions:     OT Goals(current goals can now be found in the care plan section) Acute Rehab OT Goals Patient Stated Goal: To get better OT Goal Formulation: With patient Time For Goal Achievement: 03/19/13 Potential to Achieve Goals: Good ADL Goals Pt Will Perform Grooming: with modified independence;standing Pt Will Perform Upper Body Bathing: with modified independence;sitting Pt Will Perform Lower Body Bathing: with min guard assist;with adaptive equipment;sit to/from stand Pt Will Perform Upper Body Dressing: with modified independence;sitting Pt Will Perform Lower Body Dressing: with min guard assist;with adaptive equipment;sit to/from stand Pt Will Transfer to Toilet: with modified independence;ambulating (3 in 1 over commode) Pt Will Perform Toileting - Clothing Manipulation and hygiene: with modified independence;with adaptive equipment;sit to/from stand Additional ADL Goal #1: Pt will be able to don/doff back brace independently while maintaining back precautions.   Visit Information  Last OT Received On: 03/13/13 Assistance Needed: +1 History of Present Illness: Patient is a 47 yo female s/p L3-4 PLIF.    Subjective Data      Prior Functioning       Cognition  Cognition Arousal/Alertness: Awake/alert Behavior During Therapy: WFL for tasks  assessed/performed Overall Cognitive Status:  Within Functional Limits for tasks assessed    Mobility  Bed Mobility Bed Mobility: Rolling Right;Right Sidelying to Sit;Sitting - Scoot to Edge of Bed Rolling Right: 4: Min guard;With rail Right Sidelying to Sit: 4: Min guard;HOB elevated (12 degrees) Sitting - Scoot to Edge of Bed: 4: Min guard Details for Bed Mobility Assistance: cues for technique  Transfers Transfers: Sit to Stand;Stand to Sit Sit to Stand: 4: Min guard;With upper extremity assist;From bed;From chair/3-in-1;With armrests Stand to Sit: 4: Min guard;With upper extremity assist;With armrests;To chair/3-in-1 Details for Transfer Assistance: cues for hand placement and precautions    Exercises      Balance     End of Session OT - End of Session Equipment Utilized During Treatment: Gait belt;Rolling walker;Back brace Activity Tolerance: Patient tolerated treatment well Patient left: with call bell/phone within reach;in chair Nurse Communication: Mobility status;Other (comment) (O2 stats)  GO     Benito Mccreedy OTR/L [phone] 03/13/2013, 11:03 AM

## 2013-03-13 NOTE — Clinical Social Work Psychosocial (Signed)
Clinical Social Work Department BRIEF PSYCHOSOCIAL ASSESSMENT 03/13/2013  Patient:  EBONYE, READE     Account Number:  [id number]     Admit date:  03/10/2013  Clinical Social Worker:  Donna Christen  Date/Time:  03/13/2013 02:43 PM  Referred by:  Physician  Date Referred:  03/13/2013 Referred for  SNF Placement   Other Referral:   none.   Interview type:  Patient Other interview type:   none.    PSYCHOSOCIAL DATA Living Status:  ALONE Admitted from facility:   Level of care:   Primary support name:  "Big Ronalee Belts" Primary support relationship to patient:  FRIEND Degree of support available:   Pt stated that "Big Ronalee Belts" is a strong support to her.    CURRENT CONCERNS Current Concerns  Post-Acute Placement   Other Concerns:   none.    SOCIAL WORK ASSESSMENT / PLAN CSW met with pt at bedside. Pt stated that she is originally from [place], and moved to [place] with her "ex". Pt noted that she currently lives alone at [facility] in [place], [place]. Pt was open to discussing SNF placement and would like to further discuss options of SNF placement. Pt stated that she may be able to stay with friends, but has not heard a final answer from them. Care Management is aware of pt's possible plan to discharge home with friends caring for pt. CSW to continue to follow and pursue SNF placement.   Assessment/plan status:  Psychosocial Support/Ongoing Assessment of Needs Other assessment/ plan:   none.   Information/referral to community resources:   [facility] placement.    PATIENTS/FAMILYS RESPONSE TO PLAN OF CARE: Pt is understanding and agreeable to CSW plan of care.       Pati Gallo, MSW, [place] [facility] [phone]

## 2013-03-13 NOTE — Progress Notes (Addendum)
Patient ID: Diane Gilbert, female   DOB: 1966/01/12, 47 y.o.   MRN: [id number] BP 113/44  Pulse 90  Temp(Src) 99.1 F (37.3 C) (Oral)  Resp 18  Ht _0  (1.626 m)  Wt 103.8 kg (228 lb 13.4 oz)  BMI 39.26 kg/m2  SpO2 98% Alert and oriented Moving all extremities well Wound is clean, and dry Awaiting placement for skilled nursing facility. Pulmonary consult for sleep apnea

## 2013-03-14 NOTE — Progress Notes (Signed)
Occupational Therapy Treatment Patient Details Name: Diane Gilbert MRN: [id number] DOB: Nov 09, 1965 Today's Date: 03/14/2013 Time: 1761-6073 OT Time Calculation (min): 28 min  OT Assessment / Plan / Recommendation  History of present illness Patient is a 47 yo female s/p L3-4 PLIF.   OT comments  Pt limited in session due to pain. OT provided education and reviewed information with pt. Pt did ambulate in hallway short distance. Recommending SNF if pt does not have 24/7 assist available at home.   Follow Up Recommendations  Home health OT;Supervision/Assistance - 24 hour    Barriers to Discharge       Equipment Recommendations  3 in 1 bedside comode    Recommendations for Other Services    Frequency Min 2X/week   Progress towards OT Goals Progress towards OT goals: Not progressing toward goals - comment  Plan Discharge plan remains appropriate    Precautions / Restrictions Precautions Precautions: Back Precaution Booklet Issued: Yes (comment) Precaution Comments: Reviewed precautions with pt.  Required Braces or Orthoses: Spinal Brace Spinal Brace: Lumbar corset;Applied in sitting position Restrictions Weight Bearing Restrictions: No   Pertinent Vitals/Pain Pain 8-9/10. Nurse notified.     ADL  Upper Body Dressing: Set up;Supervision/safety Where Assessed - Upper Body Dressing: Unsupported sitting Toilet Transfer: Simulated;Min guard Toilet Transfer Method: Sit to Loss adjuster, chartered: Other (comment) (from bed) Equipment Used: Back brace;Gait belt;Rolling walker;Other (comment) (toilet aid) Transfers/Ambulation Related to ADLs: Min guard  ADL Comments: Pt ambulated in room/bathroom and in hallway. Pt with increased amount of pain and reported feeling dizzy. Pt donned brace sitting EOB and OT gave cues for precautions. Pt returned to bed due to terrible pain. OT educated and demonstrated use of toilet aid and gave pt tongs from supply. OT reviewed self  care safety and information such as putting items on right side of sink, using a bag on walker to carry items, safe shoes, bending from knees to retrieve items or to reach clothing, and standing in front of chair/bed with walker in front when pulling up clothing.    OT Diagnosis:    OT Problem List:   OT Treatment Interventions:     OT Goals(current goals can now be found in the care plan section) Acute Rehab OT Goals Patient Stated Goal: To get better OT Goal Formulation: With patient Time For Goal Achievement: 03/19/13 Potential to Achieve Goals: Good ADL Goals Pt Will Perform Grooming: with modified independence;standing Pt Will Perform Upper Body Bathing: with modified independence;sitting Pt Will Perform Lower Body Bathing: with min guard assist;with adaptive equipment;sit to/from stand Pt Will Perform Upper Body Dressing: with modified independence;sitting Pt Will Perform Lower Body Dressing: with min guard assist;with adaptive equipment;sit to/from stand Pt Will Transfer to Toilet: with modified independence;ambulating (3 in 1 over commode) Pt Will Perform Toileting - Clothing Manipulation and hygiene: with modified independence;with adaptive equipment;sit to/from stand Additional ADL Goal #1: Pt will be able to don/doff back brace independently while maintaining back precautions.   Visit Information  Last OT Received On: 03/14/13 Assistance Needed: +1 History of Present Illness: Patient is a 47 yo female s/p L3-4 PLIF.    Subjective Data      Prior Functioning       Cognition  Cognition Arousal/Alertness: Awake/alert Behavior During Therapy: WFL for tasks assessed/performed Overall Cognitive Status: Within Functional Limits for tasks assessed    Mobility  Bed Mobility Bed Mobility: Rolling Right;Right Sidelying to Sit;Sit to Sidelying Right;Rolling Left Rolling Right: 5: Supervision;With rail Rolling  Left: 5: Supervision Right Sidelying to Sit: 4: Min guard;HOB  elevated Sit to Sidelying Right: 4: Min assist;HOB elevated Details for Bed Mobility Assistance: Min A to help assist leg back in bed when going to sidelying position. Cues for technique and precautions. Encouraged pt to practice with bed completely flat, but pt could not tolerate/insisted on it being elevated. Transfers Transfers: Sit to Stand;Stand to Sit Sit to Stand: 4: Min guard;With upper extremity assist;From bed Stand to Sit: 4: Min guard;With upper extremity assist;To bed Details for Transfer Assistance: vc's for precautions.    Exercises      Balance     End of Session OT - End of Session Equipment Utilized During Treatment: Gait belt;Rolling walker;Back brace;Oxygen Activity Tolerance: Patient limited by pain Patient left: in bed;with call bell/phone within reach Nurse Communication: Other (comment) (pt with increased pain; also told her BP)  GO     Benito Mccreedy OTR/L [phone] 03/14/2013, 10:33 AM

## 2013-03-14 NOTE — Progress Notes (Signed)
Physical Therapy Treatment Patient Details Name: Diane Gilbert MRN: [id number] DOB: 1966-05-17 Today's Date: 03/14/2013 Time: 1245-8099 PT Time Calculation (min): 35 min  PT Assessment / Plan / Recommendation  History of Present Illness Patient is a 47 yo female s/p L3-4 PLIF.   PT Comments   Attempted to see pt @ 10:30 AM and pt in too much pain. (OT later reported pt much more painful during OT AM session). On return in PM, pt sleeping soundly and slowly aroused. She required incr time and supervision for safety due to drowsiness. Pt reported incr leg spasms since last evening and moving more slowly today. Pt continues to be unsure of d/c plan and availability of 24/7 supervision/assist.   Follow Up Recommendations  Home health PT;Supervision/Assistance - 24 hour;Other (comment) (unclear if 24 hr assist is available)     Does the patient have the potential to tolerate intense rehabilitation     Barriers to Discharge        Equipment Recommendations  Rolling walker with 5" wheels;3in1 (PT)    Recommendations for Other Services    Frequency Min 5X/week   Progress towards PT Goals Progress towards PT goals: Progressing toward goals  Plan Current plan remains appropriate;Other (comment) (pt thinks a friend or two will stay with her 24/7)    Precautions / Restrictions Precautions Precautions: Back Precaution Booklet Issued: Yes (comment) Precaution Comments: Pt able to describe 3/3 back precautions (using different terminology, however correct) Required Braces or Orthoses: Spinal Brace Spinal Brace: Lumbar corset;Applied in sitting position Restrictions Weight Bearing Restrictions: No   Pertinent Vitals/Pain 8/10 low back initially; after mobility 4/10 back and posterior thighs    Mobility  Bed Mobility Bed Mobility: Rolling Right;Right Sidelying to Sit;Sitting - Scoot to Edge of Bed Rolling Right: 5: Supervision;With rail Right Sidelying to Sit: 5: Supervision;With  rails;HOB flat Sitting - Scoot to Edge of Bed: 5: Supervision Details for Bed Mobility Assistance: supervision due to decr arousal; slow processing, but did not require cues for sequencing or to maintain back precautions Transfers Transfers: Sit to Stand;Stand to Sit Sit to Stand: 4: Min guard;With upper extremity assist;From bed Stand to Sit: 4: Min guard;With upper extremity assist;To bed Details for Transfer Assistance: vc for safe, proper use of RW (questioning cues and pt able to correct sequence) Ambulation/Gait Ambulation/Gait Assistance: 4: Min guard Ambulation Distance (Feet): 180 Feet Assistive device: Rolling walker Ambulation/Gait Assistance Details: vc for safe, proper use of RW and for upright posture; pt moving much slower than 9/15 due to pain moving down into her posterior thighs Gait Pattern: Step-through pattern;Decreased stride length;Antalgic Gait velocity: very slow Stairs: No    Exercises     PT Diagnosis:    PT Problem List:   PT Treatment Interventions:     PT Goals (current goals can now be found in the care plan section)    Visit Information  Last PT Received On: 03/14/13 Assistance Needed: +1 History of Present Illness: Patient is a 47 yo female s/p L3-4 PLIF.    Subjective Data      Cognition  Cognition Arousal/Alertness: Lethargic;Suspect due to medications (sleeping on arrival; slow to arouse) Behavior During Therapy: Flat affect Overall Cognitive Status: Within Functional Limits for tasks assessed    Balance     End of Session PT - End of Session Equipment Utilized During Treatment: Back brace;Gait belt Activity Tolerance: Patient limited by pain Patient left: with call bell/phone within reach;with bed alarm set;in chair   GP  Diane Gilbert 03/14/2013, 3:07 PM Pager [phone]

## 2013-03-15 NOTE — Progress Notes (Signed)
Physical Therapy Treatment Patient Details Name: Diane Gilbert MRN: [id number] DOB: 1966/02/16 Today's Date: 03/15/2013 Time: 0488-8916 PT Time Calculation (min): 31 min  PT Assessment / Plan / Recommendation  History of Present Illness Patient is a 47 yo female s/p L3-4 PLIF.   PT Comments   Discussed again pt's questionable discharge plans. The friend (s) she thinks may be able to stay with her are homeless and she has been unable to get word to/from them to confirm assistance. She continues to make steady progress, yet due to leg pain/spasms she has been reluctant to attempt stairs (and she has 9 steps to enter her apartment). Agreed to attempt today, and required assist on descent due to RLE partial buckle. Due to no reliable/confirmed 24 hr assist, recommend short-term SNF on discharge.   Follow Up Recommendations  Supervision/Assistance - 24 hour;Other (comment);SNF (unclear if 24 hr assist is available)     Does the patient have the potential to tolerate intense rehabilitation     Barriers to Discharge        Equipment Recommendations  Rolling walker with 5" wheels;3in1 (PT)    Recommendations for Other Services    Frequency Min 5X/week   Progress towards PT Goals Progress towards PT goals: Progressing toward goals  Plan Other (comment);Discharge plan needs to be updated (pt again uncertain if anyone will stay with her)    Precautions / Restrictions Precautions Precautions: Back Precaution Booklet Issued: Yes (comment) Precaution Comments: Pt able to describe 3/3 back precautions (using different terminology, however correct) Required Braces or Orthoses: Spinal Brace Spinal Brace: Lumbar corset;Applied in sitting position   Pertinent Vitals/Pain 7-8/10 back and posterior legs; patient repositioned for comfort     Mobility  Bed Mobility Bed Mobility: Rolling Right;Right Sidelying to Sit;Sitting - Scoot to Edge of Bed;Rolling Left;Sit to Sidelying Right Rolling  Right: With rail;6: Modified independent (Device/Increase time) Rolling Left: 6: Modified independent (Device/Increase time) Right Sidelying to Sit: With rails;HOB flat;6: Modified independent (Device/Increase time) Sitting - Scoot to Edge of Bed: 6: Modified independent (Device/Increase time) Sit to Sidelying Right: 6: Modified independent (Device/Increase time) Details for Bed Mobility Assistance: Pt insisted on using bed rails "i can't do this, I'll roll out onto the floor" Educated that she needs to practice without rails to simulate her home environment and pt reports she doesn't plan to go straight home Transfers Transfers: Sit to Stand;Stand to Sit Sit to Stand: With upper extremity assist;5: Supervision Stand to Sit: With upper extremity assist;5: Supervision Details for Transfer Assistance: no cues needed; supervision due to drowsy from medicine Ambulation/Gait Ambulation/Gait Assistance: 4: Min guard Ambulation Distance (Feet): 150 Feet Assistive device: Rolling walker Ambulation/Gait Assistance Details: continued cuing for upright posture (pt tends to flex forward at her hips) and to keep RW closer to her body; required fewer cues today compared to previous sessions Gait Pattern: Step-through pattern;Decreased stride length;Trunk flexed Gait velocity: very slow Stairs: Yes Stairs Assistance: 4: Min assist Stairs Assistance Details (indicate cue type and reason): Pt described she usually faces forward on steps and reaches LUE across to hold onto rail with both hands; able to verbalize (with prompts) that she cannot do this without twisting and agreed to try sideways with both hands on rail; on descent, pt's RLE with slight buckling requiring assist  Stair Management Technique: One rail Right;Step to pattern;Sideways Number of Stairs: 2    Exercises     PT Diagnosis:    PT Problem List:   PT Treatment Interventions:  PT Goals (current goals can now be found in the care plan  section)    Visit Information  Last PT Received On: 03/15/13 Assistance Needed: +1 History of Present Illness: Patient is a 47 yo female s/p L3-4 PLIF.    Subjective Data      Cognition  Cognition Arousal/Alertness: Lethargic;Suspect due to medications  Behavior During Therapy: Flat affect Overall Cognitive Status: Within Functional Limits for tasks assessed    Balance     End of Session PT - End of Session Equipment Utilized During Treatment: Back brace;Gait belt Activity Tolerance: Patient limited by pain Patient left: with call bell/phone within reach;in bed   GP     Diane Gilbert 03/15/2013, 9:40 AM Pager [phone]

## 2013-03-15 NOTE — Progress Notes (Signed)
Patient ID: Diane Gilbert, female   DOB: December 07, 1965, 47 y.o.   MRN: [id number] BP 119/63  Pulse 97  Temp(Src) 99.9 F (37.7 C) (Oral)  Resp 18  Ht 5' 4" (1.626 m)  Wt 103.8 kg (228 lb 13.4 oz)  BMI 39.26 kg/m2  SpO2 100% Alert and oriented Awaiting placement Moving all extremities well Wound is clean, dry, and without signs of infection

## 2013-03-15 NOTE — Progress Notes (Signed)
Occupational Therapy Treatment Patient Details Name: Diane Gilbert MRN: [id number] DOB: [birth year] Today's Date: 03/15/2013 Time: 8338-2505 OT Time Calculation (min): 31 min  OT Assessment / Plan / Recommendation  History of present illness Patient is a 47 yo female s/p L3-4 PLIF.   OT comments  Pt performed toileting tasks, grooming at sink, and practiced with AE for LB dressing. Recommending SNF for rehab if pt does not have 24/7 assist.   Follow Up Recommendations  SNF;Supervision/Assistance - 24 hour    Barriers to Discharge       Equipment Recommendations  3 in 1 bedside comode    Recommendations for Other Services    Frequency Min 2X/week   Progress towards OT Goals Progress towards OT goals: Progressing toward goals  Plan Discharge plan needs to be updated    Precautions / Restrictions Precautions Precautions: Back Precaution Booklet Issued: Yes (comment) Precaution Comments: Pt able to describe 3/3 back precautions (using different terminology, however correct) Required Braces or Orthoses: Spinal Brace Spinal Brace: Lumbar corset;Applied in sitting position Restrictions Weight Bearing Restrictions: No   Pertinent Vitals/Pain Pain 8-9/10. Repositioned.     ADL  Grooming: Performed;Wash/dry hands;Min guard Where Assessed - Grooming: Supported standing Upper Body Dressing: Minimal assistance Where Assessed - Upper Body Dressing: Unsupported sitting Lower Body Dressing: Set up;Supervision/safety Where Assessed - Lower Body Dressing: Unsupported sitting (back not supported, but may have used armrests on chair) Toilet Transfer: Copy Method: Sit to Loss adjuster, chartered: Raised toilet seat with arms (or 3-in-1 over toilet) Toileting - Clothing Manipulation and Hygiene: Minimal assistance Where Assessed - Camera operator Manipulation and Hygiene: Standing;Sit to stand from 3-in-1 or toilet Equipment Used: Back  brace;Rolling walker;Sock aid;Reacher Transfers/Ambulation Related to ADLs: Min guard for ambulation-cues for precautions. Min guard/supervision for transfers ADL Comments: Min A to help with clothing management-cues to maintain precautions with hygiene/clothing/ and removing pad. OT educated to bend from knees. Pt practiced with reacher and sockaid to don/doff socks. Min A to help with brack brace.    OT Diagnosis:    OT Problem List:   OT Treatment Interventions:     OT Goals(current goals can now be found in the care plan section) Acute Rehab OT Goals Patient Stated Goal: To get better OT Goal Formulation: With patient Time For Goal Achievement: 03/19/13 Potential to Achieve Goals: Good ADL Goals Pt Will Perform Grooming: with modified independence;standing Pt Will Perform Upper Body Bathing: with modified independence;sitting Pt Will Perform Lower Body Bathing: with min guard assist;with adaptive equipment;sit to/from stand Pt Will Perform Upper Body Dressing: with modified independence;sitting Pt Will Perform Lower Body Dressing: with min guard assist;with adaptive equipment;sit to/from stand Pt Will Transfer to Toilet: with modified independence;ambulating (3 in 1 over commode) Pt Will Perform Toileting - Clothing Manipulation and hygiene: with modified independence;with adaptive equipment;sit to/from stand Additional ADL Goal #1: Pt will be able to don/doff back brace independently while maintaining back precautions.   Visit Information  Last OT Received On: 03/15/13 Assistance Needed: +1 History of Present Illness: Patient is a 47 yo female s/p L3-4 PLIF.    Subjective Data      Prior Functioning       Cognition  Cognition Arousal/Alertness: Awake/alert Behavior During Therapy: WFL for tasks assessed/performed Overall Cognitive Status: Within Functional Limits for tasks assessed    Mobility  Bed Mobility Bed Mobility: Rolling Right;Right Sidelying to Sit;Sit to  Sidelying Right;Rolling Left Rolling Right: 5: Supervision;With rail Rolling Left: 4: Min guard  Right Sidelying to Sit: 5: Supervision;HOB elevated Sit to Sidelying Right: 4: Min guard;HOB flat Details for Bed Mobility Assistance: Cues for precautions. Pt returned back to bed with HOB flat; OT explained that her bed at home is flat.  Transfers Transfers: Sit to Stand;Stand to Sit Sit to Stand: 4: Min guard;5: Supervision;With upper extremity assist;From bed;From chair/3-in-1 Stand to Sit: 4: Min guard;5: Supervision;With upper extremity assist;To bed;To chair/3-in-1 Details for Transfer Assistance: Cues for hand placement. Supervision for sit <> stand from 3 in 1, otherwise Min guard    Exercises      Balance     End of Session OT - End of Session Equipment Utilized During Treatment: Rolling walker;Back brace Activity Tolerance: Patient tolerated treatment well Patient left: in bed;with call bell/phone within reach;Other (comment) (her case manager)  GO     Benito Mccreedy OTR/L [phone] 03/15/2013, 1:57 PM

## 2013-03-16 MED ORDER — OXYCODONE-ACETAMINOPHEN 5-325 MG PO TABS: 1.0000 | ORAL_TABLET | Freq: Four times a day (QID) | ORAL | Status: DC | PRN

## 2013-03-16 MED ORDER — METHOCARBAMOL 500 MG PO TABS: 1000.0000 mg | ORAL_TABLET | Freq: Three times a day (TID) | ORAL | Status: DC | PRN

## 2013-03-16 NOTE — Progress Notes (Addendum)
Clinical Education officer, museum (CSW) spoke with pt Housing Case Airline pilot who stated that pt housing manager will allow for someone to stay with pt if MD writes a letter stating how long pt will need supervision for, letter to be faxed to [employer] at [phone]. CSW left a message with MD secretary Jacqlyn Larsen to notify of letter needed today. CSW also spoke with PT who informed CSW that pt ambulated very well today with a walker and needs limited support. CSW spoke with pt and had pt call her friend Mena Pauls [phone] who confirmed that she is able to stay with pt and provide assistance as needed.   Hunt Oris, MSW, [place]

## 2013-03-16 NOTE — Progress Notes (Signed)
Assisting Clinical Social Worker (CSW) aware that pt is medically ready. CSW has made contact with pt housing Case Manager [place] who will contact housing manager to determine whether pt can have someone stay with pt vs. Pt dc'ing to a SNF. CSW awaiting a call back. CSW to continue calling SNF's for potential placement.  Hunt Oris, MSW, [place]

## 2013-03-16 NOTE — Progress Notes (Signed)
I spoke with [name] to verify her caregiver support. While speaking with Ms. January, her friend called and confirmed that he and her (caregiver) friend will come to the hospital this evening to provide transportation home. Treatment team is aware of the plan. Please contact me if there is a problem.  Diane Gilbert, Environmental manager - Clinical Social Work [phone]

## 2013-03-16 NOTE — Progress Notes (Signed)
Physical Therapy Treatment Patient Details Name: Diane Gilbert MRN: [id number] DOB: [birth year] Today's Date: 03/16/2013 Time: 1211-1242 PT Time Calculation (min): 31 min  PT Assessment / Plan / Recommendation  History of Present Illness Patient is a 47 yo female s/p L3-4 PLIF.   PT Comments   Pt has made great progress with mobility. Have spoken with treatment team regarding D/C options. Pt was agreeable to my session. I left the room to get a pulse ox and pt had already donned her brace, got oob, and was walking in her room upon my return. Pt did agree to walk down the hall. Pt demonstrated mod. Independence with RW on the unit and was able to complete steps with supervision. Pt O2 sats stayed above 96% on RA. Pt was able to recall back precautions when questioned. I recommend d/c home and arrangements are being made to provide Supervision. Pt is agreeable to d/c home today if MD approves.  Follow Up Recommendations  No PT follow up;Supervision - Intermittent     Does the patient have the potential to tolerate intense rehabilitation     Barriers to Discharge        Equipment Recommendations  Rolling walker with 5" wheels    Recommendations for Other Services    Frequency     Progress towards PT Goals Progress towards PT goals: Progressing toward goals  Plan Discharge plan needs to be updated    Precautions / Restrictions Precautions Precautions: Back Precaution Comments: Pt able to recall 3/3 back precautions  Independently Required Braces or Orthoses: Spinal Brace Spinal Brace: Lumbar corset Restrictions Weight Bearing Restrictions: No   Pertinent Vitals/Pain     Mobility  Bed Mobility Bed Mobility: Supine to Sit;Sitting - Scoot to Edge of Bed Rolling Right: 6: Modified independent (Device/Increase time) Supine to Sit: 6: Modified independent (Device/Increase time) Sitting - Scoot to Edge of Bed: 6: Modified independent (Device/Increase time) Transfers Sit to  Stand: 6: Modified independent (Device/Increase time) Stand to Sit: 6: Modified independent (Device/Increase time) Ambulation/Gait Ambulation/Gait Assistance: 6: Modified independent (Device/Increase time) Assistive device: Rolling walker Ambulation/Gait Assistance Details: Did reinforce upright posture once and pt did maintin throught session. Gait Pattern: Step-through pattern;Decreased stride length Gait velocity: decreased Stairs: Yes Stairs Assistance: 5: Supervision Stair Management Technique: One rail Right;Sideways Number of Stairs: 2    Exercises     PT Diagnosis:    PT Problem List:   PT Treatment Interventions:     PT Goals (current goals can now be found in the care plan section)    Visit Information  Last PT Received On: 03/16/13 Assistance Needed: +1 History of Present Illness: Patient is a 47 yo female s/p L3-4 PLIF.    Subjective Data      Cognition  Cognition Arousal/Alertness: Awake/alert Behavior During Therapy: WFL for tasks assessed/performed Overall Cognitive Status: Within Functional Limits for tasks assessed    Balance     End of Session PT - End of Session Equipment Utilized During Treatment: Gait belt;Back brace Activity Tolerance: Patient tolerated treatment well Patient left: in bed;with call bell/phone within reach Nurse Communication: Mobility status   GP     Lelon Mast 03/16/2013, 12:44 PM

## 2013-03-16 NOTE — Progress Notes (Signed)
Patient discharged from room 4N05. Alert and in stable condition. IV site d/c'd. Instructions read to patient and verbalized understanding. Left unit via wheelchair.

## 2013-03-17 NOTE — Care Management Note (Signed)
Page 1 of 2   03/17/2013     10:23:41 AM   CARE MANAGEMENT NOTE 03/17/2013  Patient:  Diane Gilbert, Diane Gilbert   Account Number:  [id number]  Date Initiated:  03/14/2013  Documentation initiated by:  Lorne Skeens  Subjective/Objective Assessment:   Patient was admitted for PLIF with decompression. Lives at home alone     Action/Plan:   Will follow for discharge needs.   Anticipated DC Date:  03/15/2013   Anticipated DC Plan:  SKILLED NURSING FACILITY  In-house referral  Clinical Social Worker      DC Planning Services  CM consult      Choice offered to / List presented to:     DME arranged  3-N-1  [place]           Status of service:  Completed, signed off Medicare Important Message given?   (If response is "NO", the following Medicare IM given date fields will be blank) Date Medicare IM given:   Date Additional Medicare IM given:    Discharge Disposition:  HOME/SELF CARE  Per UR Regulation:  Reviewed for med. necessity/level of care/duration of stay  If discussed at [place] of Stay Meetings, dates discussed:    Comments:  03/16/13 Lorne Skeens RN, MSN, CM- PT/OT updated recommendations noted.  Patient is no longer requiring PT/OT follow-up, but lives alone and needs 24hr supervision at this time. CSW supervisor contacted Dr Christella Noa requesting a letter discussing the need for patient to have 24hr supervision.  This letter will be faxed to patient's housing case manager by CSW supervisor.  CM noted recommendations for rolling walker and 3n1. Orders were placed and [facility] DME was notified of pending discharge today.    03/16/13 0740 Lorne Skeens RN, MSN, CM- Spoke with Dr Christella Noa to discuss difficulty with obtaining PT/OT at SNF due to patient's insurance. Dr Christella Noa is aware that patient is not eligiable for home health PT/OT. Per Dr Christella Noa, PT/OT recommendations for Snf should be followed unless patient refuses.  Information shared with CSW.

## 2013-03-17 NOTE — Discharge Summary (Signed)
Physician Discharge Summary  Patient ID: MARAJADE LEI MRN: [id number] DOB/AGE: [birth year] 47 y.o.  Admit date: 03/10/2013 Discharge date: 03/17/2013  Admission Diagnoses:lumbar stenosis lumbar spondylosis L3/4   Discharge Diagnoses:lumbar stenosis lumbar spondylosis L3/4  Active Problems:   * No active hospital problems. *   Discharged Condition: good  Hospital Course: Mrs. Doering was admitted and taken to the operating room where she underwent an uncomplicated lumbar decompression and fusion. Post operatively she was slow to mobilize but improve with occupational therapy, and physical therapy. She had normal strength post op and was ambulating, tolerating a regular diet, and voiding without difficulty.   Consults: None  Significant Diagnostic Studies: none  Treatments: surgery: Lumbar Three Four decompression beyond what is needed for a PLIF with posterior lumbar interbody fusion  posterior lateral arthrodesis morselized autograft  posterior nonsegmental instrumentation, L3-4, nuvasive instrumentation   Discharge Exam: Blood pressure 139/66, pulse 93, temperature 98.8 F (37.1 C), temperature source Oral, resp. rate 18, height _0  (1.626 m), weight 103.8 kg (228 lb 13.4 oz), SpO2 97.00%. General appearance: alert, cooperative, appears stated age and no distress Neurologic: Alert and oriented X 3, normal strength and tone. Normal symmetric reflexes. Normal coordination and gait  Disposition: 01-Home or Self Care  Discharge Orders   Future Orders Complete By Expires   Walker rolling  As directed        Medication List         ARIPiprazole 5 MG tablet  Commonly known as:  ABILIFY  Take 5 mg by mouth daily.     buPROPion 150 MG 12 hr tablet  Commonly known as:  WELLBUTRIN SR  Take 150 mg by mouth 2 (two) times daily.     clotrimazole 1 % cream  Commonly known as:  LOTRIMIN  Apply 1 application topically 2 (two) times daily.     diclofenac 75 MG EC  tablet  Commonly known as:  VOLTAREN  Take 75 mg by mouth 2 (two) times daily.     gabapentin 100 MG capsule  Commonly known as:  NEURONTIN  Take 100-400 mg by mouth 4 (four) times daily. Takes 100 mg 3 times daily and 400 mg at bedtime     lisinopril-hydrochlorothiazide 10-12.5 MG per tablet  Commonly known as:  PRINZIDE,ZESTORETIC  Take 1 tablet by mouth daily.     methocarbamol 500 MG tablet  Commonly known as:  ROBAXIN  Take 2 tablets (1,000 mg total) by mouth every 8 (eight) hours as needed (muscle spasms).     multivitamin with minerals Tabs tablet  Take 1 tablet by mouth daily.     oxyCODONE-acetaminophen 5-325 MG per tablet  Commonly known as:  ROXICET  Take 1 tablet by mouth every 6 (six) hours as needed for pain.     oxyCODONE-acetaminophen 5-325 MG per tablet  Commonly known as:  PERCOCET/ROXICET  Take 1 tablet by mouth every 4 (four) hours as needed for pain.     QUEtiapine 100 MG tablet  Commonly known as:  SEROQUEL  Take 100 mg by mouth at bedtime.     tiZANidine 4 MG capsule  Commonly known as:  ZANAFLEX  Take 4 mg by mouth 3 (three) times daily.           Follow-up Information   Follow up with Tiesha Marich L, MD In 3 weeks. (call office to make appointment)    Specialty:  Neurosurgery   Contact information:   [address], [address]  [address] [place] [address] [phone]       Signed: Rukia Mcgillivray L 03/17/2013, 8:26 PM

## 2013-05-04 ENCOUNTER — Ambulatory Visit: Payer: Medicaid Other | Attending: Neurosurgery

## 2013-05-04 DIAGNOSIS — R5381 Other malaise: Secondary | ICD-10-CM | POA: Insufficient documentation

## 2013-05-04 DIAGNOSIS — IMO0001 Reserved for inherently not codable concepts without codable children: Secondary | ICD-10-CM | POA: Insufficient documentation

## 2013-05-04 DIAGNOSIS — R262 Difficulty in walking, not elsewhere classified: Secondary | ICD-10-CM | POA: Insufficient documentation

## 2013-05-04 DIAGNOSIS — M545 Low back pain, unspecified: Secondary | ICD-10-CM | POA: Insufficient documentation

## 2013-06-06 ENCOUNTER — Ambulatory Visit: Payer: Medicaid Other | Attending: Neurosurgery | Admitting: Physical Therapy

## 2013-06-06 DIAGNOSIS — M545 Low back pain, unspecified: Secondary | ICD-10-CM | POA: Insufficient documentation

## 2013-06-06 DIAGNOSIS — R262 Difficulty in walking, not elsewhere classified: Secondary | ICD-10-CM | POA: Insufficient documentation

## 2013-06-06 DIAGNOSIS — IMO0001 Reserved for inherently not codable concepts without codable children: Secondary | ICD-10-CM | POA: Insufficient documentation

## 2013-06-06 DIAGNOSIS — R5381 Other malaise: Secondary | ICD-10-CM | POA: Insufficient documentation

## 2013-06-12 ENCOUNTER — Ambulatory Visit: Payer: Medicaid Other

## 2013-06-15 ENCOUNTER — Ambulatory Visit: Payer: Medicaid Other

## 2013-09-06 ENCOUNTER — Ambulatory Visit (HOSPITAL_BASED_OUTPATIENT_CLINIC_OR_DEPARTMENT_OTHER): Payer: Medicaid Other | Attending: Internal Medicine | Admitting: Radiology

## 2013-09-06 VITALS — Ht 64.0 in | Wt 228.0 lb

## 2013-09-06 DIAGNOSIS — G4700 Insomnia, unspecified: Secondary | ICD-10-CM | POA: Insufficient documentation

## 2013-09-06 DIAGNOSIS — G4733 Obstructive sleep apnea (adult) (pediatric): Secondary | ICD-10-CM | POA: Insufficient documentation

## 2013-09-16 DIAGNOSIS — G4733 Obstructive sleep apnea (adult) (pediatric): Secondary | ICD-10-CM

## 2013-09-16 NOTE — Sleep Study (Signed)
   NAME: Diane Gilbert DATE OF BIRTH:  [birth year] MEDICAL RECORD NUMBER [id number]  LOCATION: [facility]  PHYSICIAN: Ivann Trimarco D  DATE OF STUDY: 09/06/2013  SLEEP STUDY TYPE: Nocturnal Polysomnogram               REFERRING PHYSICIAN: Philis Fendt, MD  INDICATION FOR STUDY: Insomnia with sleep apnea  EPWORTH SLEEPINESS SCORE:   3/24 HEIGHT: _0  (162.6 cm)  WEIGHT: 228 lb (103.42 kg)    Body mass index is 39.12 kg/(m^2).  NECK SIZE: 16 in.  MEDICATIONS: Charted for review  SLEEP ARCHITECTURE: Total sleep time 198 minutes with sleep efficiency 46.8%. Stage I was 33.8%, stage II 53.5%, stage III absent, REM 12.6% of total CPAP. Sleep latency 47.5 minutes, REM latency 344 minutes, awake after sleep onset 178 minutes, arousal index 4.8. Bedtime medication: None. The patient was intermittently awake for large portions of this night.  RESPIRATORY DATA: Apnea hypopneas syndrome (AHI) 10 per hour. 33 total events scored including 6 obstructive apneas and 27 hypopneas. Events were associated with non-supine sleep position. REM AHI 36 per hour. There was insufficient sleep to meet protocol requirements for split CPAP titration.  OXYGEN DATA: Moderate snoring with oxygen desaturation to a nadir of 80%. Supplemental oxygen was added at 1 L per minute for sustained saturation less than 88% at 23:14 PM. Mean oxygen saturation with supplemental oxygen held 90.8%.  CARDIAC DATA: Sinus rhythm with PACs  MOVEMENT/PARASOMNIA: No significant movement disturbance, bathroom x2  IMPRESSION/ RECOMMENDATION:   1) Difficulty initiating and maintaining sleep, not just related to respiratory disturbance. Consider management as insomnia. 2) Mild obstructive sleep apnea/hypopneas syndrome, AHI 10.0 per hour with nonsupine events. Moderate snoring with oxygen desaturation to a nadir of 80% on room air. 3) Supplemental oxygen was added at 1 L per minute because of sustained  saturation less than 88% on room air. This held mean saturation at 90.8% through the study. 4) Room air oxygen saturation on arrival was 90%. This suggests possible underlying cardiopulmonary disease.  5) because of lack of sufficient early sleep and events, the patient did not qualify for split protocol CPAP titration. If CPAP is a therapeutic choice, the patient can return for dedicated CPAP titration study if appropriate.  Signed Baird Lyons M.D. Deneise Lever Diplomate, American Board of Sleep Medicine  ELECTRONICALLY SIGNED ON:  09/16/2013, 8:34 AM [place] PH: [phone]   FX: [phone] [place]

## 2013-10-09 ENCOUNTER — Other Ambulatory Visit: Payer: Self-pay

## 2013-10-09 DIAGNOSIS — Z1231 Encounter for screening mammogram for malignant neoplasm of breast: Secondary | ICD-10-CM

## 2013-10-19 ENCOUNTER — Other Ambulatory Visit (HOSPITAL_COMMUNITY): Payer: Self-pay | Admitting: Internal Medicine

## 2013-10-19 ENCOUNTER — Ambulatory Visit (HOSPITAL_COMMUNITY)
Admission: RE | Admit: 2013-10-19 | Discharge: 2013-10-19 | Disposition: A | Discharge status: Home / Self Care | Payer: Medicaid Other | Source: Ambulatory Visit | Attending: Internal Medicine | Admitting: Internal Medicine

## 2013-10-19 DIAGNOSIS — J449 Chronic obstructive pulmonary disease, unspecified: Secondary | ICD-10-CM

## 2013-10-19 DIAGNOSIS — J4489 Other specified chronic obstructive pulmonary disease: Secondary | ICD-10-CM | POA: Insufficient documentation

## 2013-10-31 ENCOUNTER — Ambulatory Visit
Admission: RE | Admit: 2013-10-31 | Discharge: 2013-10-31 | Disposition: A | Discharge status: Home / Self Care | Payer: Medicaid Other | Source: Ambulatory Visit

## 2013-10-31 DIAGNOSIS — Z1231 Encounter for screening mammogram for malignant neoplasm of breast: Secondary | ICD-10-CM

## 2013-12-26 ENCOUNTER — Encounter: Payer: Self-pay | Admitting: Pulmonary Disease

## 2013-12-26 ENCOUNTER — Ambulatory Visit (INDEPENDENT_AMBULATORY_CARE_PROVIDER_SITE_OTHER): Payer: Medicaid Other | Admitting: Pulmonary Disease

## 2013-12-26 ENCOUNTER — Encounter (INDEPENDENT_AMBULATORY_CARE_PROVIDER_SITE_OTHER): Payer: Self-pay

## 2013-12-26 VITALS — BP 122/80 | HR 86 | Temp 98.2°F | Ht 64.0 in | Wt 244.4 lb

## 2013-12-26 DIAGNOSIS — R911 Solitary pulmonary nodule: Secondary | ICD-10-CM

## 2013-12-26 DIAGNOSIS — J84115 Respiratory bronchiolitis interstitial lung disease: Secondary | ICD-10-CM | POA: Insufficient documentation

## 2013-12-26 DIAGNOSIS — R9389 Abnormal findings on diagnostic imaging of other specified body structures: Secondary | ICD-10-CM

## 2013-12-26 NOTE — Patient Instructions (Signed)
Will schedule for a scan of your chest to better evaluate the abnormalities on your chest xray.  Will call you with the results. You need to stop smoking.

## 2013-12-26 NOTE — Assessment & Plan Note (Signed)
The patient has an abnormal density in the right upper lobe that may represent a pulmonary nodule. She will obviously need a CT of her chest, and she definitely needs to quit smoking.

## 2013-12-26 NOTE — Progress Notes (Signed)
Subjective:    Patient ID: Diane Gilbert, female    DOB: [birth year], 48 y.o.   MRN: [id number]  HPI The patient is a 48 year old female who I've been asked to see for an abnormal chest x-ray. She recently had a chest x-ray that showed a questionable right upper lobe nodule, and as well as more prominent interstitial markings. The patient has a long history of smoking, and unfortunately still smokes one pack per day. She has a history of living in [place] for 3 years, but has never lived in the [place]. She has no history of TB exposure, and has had a negative PPD in the past. She is unsure if her health maintenance is up-to-date. She has had a chronic cough with white mucus production, and as well as chronic dyspnea on exertion. She denies any hemoptysis or pleuritic chest pain. She has had intermittent lower extremity edema. Her appetite has been very good, and she tells me that she has continued to gain weight.   Review of Systems  Constitutional: Negative for fever and unexpected weight change.  HENT: Positive for congestion and postnasal drip. Negative for dental problem, ear pain, nosebleeds, rhinorrhea, sinus pressure, sneezing, sore throat and trouble swallowing.   Eyes: Negative for redness and itching.  Respiratory: Positive for cough, chest tightness, shortness of breath and wheezing.   Cardiovascular: Negative for palpitations and leg swelling.  Gastrointestinal: Negative for nausea and vomiting.  Genitourinary: Negative for dysuria.  Musculoskeletal: Negative for joint swelling.  Skin: Negative for rash.  Neurological: Negative for headaches.  Hematological: Does not bruise/bleed easily.  Psychiatric/Behavioral: Negative for dysphoric mood. The patient is not nervous/anxious.        Objective:   Physical Exam Constitutional:  Obese female, no acute distress  HENT:  Nares patent without discharge, enlarged turbinates.  Oropharynx without exudate, palate and uvula  are thick and elongated.  Eyes:  Perrla, eomi, no scleral icterus  Neck:  No JVD, no TMG  Cardiovascular:  Normal rate, regular rhythm, no rubs or gallops.  No murmurs        Intact distal pulses  Pulmonary :  Normal breath sounds, no stridor or respiratory distress   No rhonchi, or wheezing.  Mild basilar crackles.  Abdominal:  Soft, nondistended, bowel sounds present.  No tenderness noted.   Musculoskeletal:  mild lower extremity edema noted.  Lymph Nodes:  No cervical lymphadenopathy noted  Skin:  No cyanosis noted  Neurologic:  Alert, appropriate, moves all 4 extremities without obvious deficit.         Assessment & Plan:

## 2013-12-26 NOTE — Assessment & Plan Note (Signed)
Other than her abnormal density in the right upper lobe, the patient also has prominent interstitial markings that may just simply be related to her ongoing smoking. We also need to consider the possibility of mild edema, or possibly RB ILD with her ongoing smoking history. Will do a high-resolution cuts on her CT scan to further evaluate.

## 2014-01-04 ENCOUNTER — Other Ambulatory Visit: Payer: Medicaid Other

## 2014-01-05 ENCOUNTER — Other Ambulatory Visit: Payer: Self-pay | Admitting: Pulmonary Disease

## 2014-01-05 DIAGNOSIS — R911 Solitary pulmonary nodule: Secondary | ICD-10-CM

## 2014-01-10 ENCOUNTER — Ambulatory Visit (INDEPENDENT_AMBULATORY_CARE_PROVIDER_SITE_OTHER)
Admission: RE | Admit: 2014-01-10 | Discharge: 2014-01-10 | Disposition: A | Discharge status: Home / Self Care | Payer: Medicaid Other | Source: Ambulatory Visit | Attending: Pulmonary Disease | Admitting: Pulmonary Disease

## 2014-01-10 DIAGNOSIS — R911 Solitary pulmonary nodule: Secondary | ICD-10-CM

## 2014-01-22 ENCOUNTER — Ambulatory Visit (INDEPENDENT_AMBULATORY_CARE_PROVIDER_SITE_OTHER)
Admission: RE | Admit: 2014-01-22 | Discharge: 2014-01-22 | Disposition: A | Discharge status: Home / Self Care | Payer: Medicaid Other | Source: Ambulatory Visit | Attending: Pulmonary Disease | Admitting: Pulmonary Disease

## 2014-01-22 DIAGNOSIS — R911 Solitary pulmonary nodule: Secondary | ICD-10-CM

## 2014-01-22 DIAGNOSIS — R9389 Abnormal findings on diagnostic imaging of other specified body structures: Secondary | ICD-10-CM

## 2014-01-24 ENCOUNTER — Other Ambulatory Visit: Payer: Self-pay | Admitting: Neurosurgery

## 2014-01-24 DIAGNOSIS — M47817 Spondylosis without myelopathy or radiculopathy, lumbosacral region: Secondary | ICD-10-CM

## 2014-01-24 NOTE — Progress Notes (Signed)
Quick Note:  Called spoke with patient, advised of CT results / recs as stated by [facility]. Pt verbalized her understanding, denied any questions at this time and is aware she will be contacted when [facility] returns to the office. ______

## 2014-01-30 ENCOUNTER — Telehealth: Payer: Self-pay | Admitting: Pulmonary Disease

## 2014-01-30 NOTE — Telephone Encounter (Signed)
Called to discuss ct chest with pt.  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  Reviewed with pt option to do FOB with [facility] vs getting her to quit smoking for a few mos and repeating the ct chest.  The pt is willing to try this, but will call me if her breathing gets worse.  Anderson Malta, please make an apptm for her to see me in 28mo, and stress to her to stop smoking.  Thanks.

## 2014-02-01 NOTE — Telephone Encounter (Signed)
Apt set for 04-03-14 and I spoke with pt about smoking cessation. [place] Bing, CMA

## 2014-02-02 ENCOUNTER — Other Ambulatory Visit: Payer: Medicaid Other

## 2014-02-05 ENCOUNTER — Other Ambulatory Visit: Payer: Medicaid Other

## 2014-04-05 ENCOUNTER — Encounter: Payer: Self-pay | Admitting: Pulmonary Disease

## 2014-04-05 ENCOUNTER — Ambulatory Visit (INDEPENDENT_AMBULATORY_CARE_PROVIDER_SITE_OTHER): Payer: Medicaid Other | Admitting: Pulmonary Disease

## 2014-04-05 VITALS — BP 118/62 | HR 90 | Temp 98.7°F | Ht 64.0 in | Wt 244.4 lb

## 2014-04-05 DIAGNOSIS — J84115 Respiratory bronchiolitis interstitial lung disease: Secondary | ICD-10-CM

## 2014-04-05 MED ORDER — ALBUTEROL SULFATE HFA 108 (90 BASE) MCG/ACT IN AERS: 2.0000 | INHALATION_SPRAY | Freq: Four times a day (QID) | RESPIRATORY_TRACT | Status: DC | PRN

## 2014-04-05 NOTE — Patient Instructions (Signed)
Will send in a prescription for your albuterol.  You can take 2 puffs every 6 hrs if needed. You really need to work on stopping smoking. Will schedule for breathing studies Will schedule for bronchoscopy to do biopsy of lung and find out what is going on.

## 2014-04-05 NOTE — Addendum Note (Signed)
Addended by: Inge Rise on: 04/05/2014 12:22 PM   Modules accepted: Orders

## 2014-04-05 NOTE — Assessment & Plan Note (Signed)
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 She had wanted to stop smoking and do a repeat CT to see if things have cleared, but unfortunately she was not able to discontinue. She continues to have shortness of breath, cough, and intermittent mucus. At this point, I really think she needs to have bronchoscopy with transbronchial lung biopsy to make sure she does not have sarcoid or hypersensitivity pneumonitis.  I have discussed the procedure with her, as well as the risk of pneumothorax and bleeding, and she agrees to proceed. In the meantime, I've asked her to stop smoking, and to continue albuterol as needed.  We will also need to schedule her for pulmonary function studies.

## 2014-04-05 NOTE — Progress Notes (Signed)
Subjective:    Patient ID: Diane Gilbert, female    DOB: June 22, 1966, 48 y.o.   MRN: [id number]  HPI The patient comes in today for followup of her abnormal CT chest and ongoing pulmonary symptoms. At the last visit, she was found to have a nodular interstitial process that was suggestive of RV ILD, sarcoid, or possibly hypersensitivity pneumonitis. The patient is a long-time smoker, and I discussed with her bronchoscopy versus a trial of smoking cessation with followup CT. The patient agreed to the latter, but unfortunately has not been able to stop smoking. She continues to have dyspnea on exertion, cough, and congestion.  She tells me that she has been labeled as having "COPD", but has never had pulmonary function studies. These will certainly need to be done.   Review of Systems  Constitutional: Negative for fever and unexpected weight change.  HENT: Positive for congestion and postnasal drip. Negative for dental problem, ear pain, nosebleeds, rhinorrhea, sinus pressure, sneezing, sore throat and trouble swallowing.   Eyes: Negative for redness and itching.  Respiratory: Positive for cough, chest tightness, shortness of breath and wheezing.   Cardiovascular: Positive for leg swelling. Negative for palpitations.  Gastrointestinal: Negative for nausea and vomiting.  Genitourinary: Negative for dysuria.  Musculoskeletal: Negative for joint swelling.  Skin: Negative for rash.  Neurological: Negative for headaches.  Hematological: Does not bruise/bleed easily.  Psychiatric/Behavioral: Negative for dysphoric mood. The patient is not nervous/anxious.        Objective:   Physical Exam Obese female in no acute distress Nose without purulence or discharge noted Neck without lymphadenopathy or thyromegaly Chest with some decreased breath sounds, a few basilar crackles, no wheeze Cardiac exam with regular rate and rhythm Lower extremities without edema, no cyanosis Alert and  oriented, moves all 4 extremities.       Assessment & Plan:

## 2014-04-10 ENCOUNTER — Ambulatory Visit (HOSPITAL_COMMUNITY)
Admission: RE | Admit: 2014-04-10 | Discharge: 2014-04-10 | Disposition: A | Discharge status: Home / Self Care | Payer: Medicaid Other | Source: Ambulatory Visit | Attending: Pulmonary Disease | Admitting: Pulmonary Disease

## 2014-04-10 DIAGNOSIS — F1010 Alcohol abuse, uncomplicated: Secondary | ICD-10-CM | POA: Diagnosis not present

## 2014-04-10 DIAGNOSIS — R0981 Nasal congestion: Secondary | ICD-10-CM | POA: Diagnosis not present

## 2014-04-10 DIAGNOSIS — J84115 Respiratory bronchiolitis interstitial lung disease: Secondary | ICD-10-CM

## 2014-04-10 DIAGNOSIS — J988 Other specified respiratory disorders: Secondary | ICD-10-CM | POA: Diagnosis not present

## 2014-04-10 DIAGNOSIS — R0982 Postnasal drip: Secondary | ICD-10-CM | POA: Diagnosis not present

## 2014-04-10 DIAGNOSIS — F17210 Nicotine dependence, cigarettes, uncomplicated: Secondary | ICD-10-CM | POA: Diagnosis not present

## 2014-04-10 DIAGNOSIS — R918 Other nonspecific abnormal finding of lung field: Secondary | ICD-10-CM | POA: Diagnosis present

## 2014-04-10 LAB — PULMONARY FUNCTION TEST
DL/VA % PRED: 94 %
DL/VA: 4.76 ml/min/mmHg/L
DLCO COR % PRED: 45 %
DLCO COR: 12.38 ml/min/mmHg
DLCO unc % pred: 45 %
DLCO unc: 12.38 ml/min/mmHg
FEF 25-75 PRE: 1.68 L/s
FEF 25-75 Post: 1.3 L/sec
FEF2575-%CHANGE-POST: -22 %
FEF2575-%PRED-POST: 47 %
FEF2575-%PRED-PRE: 60 %
FEV1-%CHANGE-POST: -6 %
FEV1-%Pred-Post: 53 %
FEV1-%Pred-Pre: 56 %
FEV1-POST: 1.38 L
FEV1-Pre: 1.48 L
FEV1FVC-%Change-Post: -5 %
FEV1FVC-%PRED-PRE: 104 %
FEV6-%Change-Post: 0 %
FEV6-%PRED-POST: 55 %
FEV6-%PRED-PRE: 55 %
FEV6-PRE: 1.73 L
FEV6-Post: 1.72 L
FEV6FVC-%Pred-Post: 102 %
FEV6FVC-%Pred-Pre: 102 %
FVC-%Change-Post: 0 %
FVC-%PRED-POST: 53 %
FVC-%Pred-Pre: 53 %
FVC-POST: 1.72 L
FVC-PRE: 1.73 L
POST FEV1/FVC RATIO: 80 %
PRE FEV1/FVC RATIO: 85 %
PRE FEV6/FVC RATIO: 100 %
Post FEV6/FVC ratio: 100 %
RV % pred: 79 %
RV: 1.42 L
TLC % pred: 60 %
TLC: 3.25 L

## 2014-04-10 MED ORDER — ALBUTEROL SULFATE (2.5 MG/3ML) 0.083% IN NEBU
2.5000 mg | INHALATION_SOLUTION | Freq: Once | RESPIRATORY_TRACT | Status: AC
  Administered 2014-04-10: 2.5 mg via RESPIRATORY_TRACT

## 2014-04-11 ENCOUNTER — Encounter (HOSPITAL_COMMUNITY): Payer: Self-pay | Admitting: Respiratory Therapy

## 2014-04-11 ENCOUNTER — Ambulatory Visit (HOSPITAL_COMMUNITY)
Admission: RE | Admit: 2014-04-11 | Discharge: 2014-04-11 | Disposition: A | Discharge status: Home / Self Care | Payer: Medicaid Other | Source: Ambulatory Visit | Attending: Pulmonary Disease | Admitting: Pulmonary Disease

## 2014-04-11 ENCOUNTER — Ambulatory Visit (HOSPITAL_COMMUNITY): Payer: Medicaid Other

## 2014-04-11 ENCOUNTER — Encounter (HOSPITAL_COMMUNITY)
Admission: RE | Disposition: A | Discharge status: Home / Self Care | Payer: Self-pay | Source: Ambulatory Visit | Attending: Pulmonary Disease

## 2014-04-11 DIAGNOSIS — F1010 Alcohol abuse, uncomplicated: Secondary | ICD-10-CM | POA: Insufficient documentation

## 2014-04-11 DIAGNOSIS — R0981 Nasal congestion: Secondary | ICD-10-CM | POA: Insufficient documentation

## 2014-04-11 DIAGNOSIS — R0982 Postnasal drip: Secondary | ICD-10-CM | POA: Insufficient documentation

## 2014-04-11 DIAGNOSIS — J988 Other specified respiratory disorders: Secondary | ICD-10-CM | POA: Diagnosis not present

## 2014-04-11 DIAGNOSIS — F17210 Nicotine dependence, cigarettes, uncomplicated: Secondary | ICD-10-CM | POA: Insufficient documentation

## 2014-04-11 DIAGNOSIS — Z9889 Other specified postprocedural states: Secondary | ICD-10-CM

## 2014-04-11 HISTORY — PX: VIDEO BRONCHOSCOPY: SHX5072

## 2014-04-11 LAB — BODY FLUID CELL COUNT WITH DIFFERENTIAL
EOS FL: 21 %
LYMPHS FL: 1 %
Monocyte-Macrophage-Serous Fluid: 49 % — ABNORMAL LOW (ref 50–90)
NEUTROPHIL FLUID: 29 % — AB (ref 0–25)
Total Nucleated Cell Count, Fluid: 85 cu mm (ref 0–1000)

## 2014-04-11 SURGERY — BRONCHOSCOPY, WITH FLUOROSCOPY
Anesthesia: Moderate Sedation | Laterality: Bilateral

## 2014-04-11 MED ORDER — LIDOCAINE HCL 2 % EX GEL: 1.0000 "application " | Freq: Once | CUTANEOUS | Status: DC

## 2014-04-11 MED ORDER — MEPERIDINE HCL 25 MG/ML IJ SOLN
INTRAMUSCULAR | Status: DC | PRN
  Administered 2014-04-11: 50 mg via INTRAVENOUS
  Administered 2014-04-11: 25 mg via INTRAVENOUS

## 2014-04-11 MED ORDER — MEPERIDINE HCL 100 MG/ML IJ SOLN
INTRAMUSCULAR | Status: AC
  Filled 2014-04-11: qty 2

## 2014-04-11 MED ORDER — PHENYLEPHRINE HCL 0.25 % NA SOLN: 1.0000 | Freq: Four times a day (QID) | NASAL | Status: DC | PRN

## 2014-04-11 MED ORDER — MIDAZOLAM HCL 10 MG/2ML IJ SOLN
INTRAMUSCULAR | Status: DC | PRN
  Administered 2014-04-11: 5 mg via INTRAVENOUS
  Administered 2014-04-11 (×2): 2.5 mg via INTRAVENOUS

## 2014-04-11 MED ORDER — PHENYLEPHRINE HCL 0.25 % NA SOLN
NASAL | Status: DC | PRN
  Administered 2014-04-11: 1 via NASAL

## 2014-04-11 MED ORDER — LIDOCAINE HCL 1 % IJ SOLN
INTRAMUSCULAR | Status: DC | PRN
  Administered 2014-04-11: 6 mg

## 2014-04-11 MED ORDER — BUTAMBEN-TETRACAINE-BENZOCAINE 2-2-14 % EX AERO
1.0000 | INHALATION_SPRAY | Freq: Once | CUTANEOUS | Status: DC
Start: 2014-04-11 — End: 2014-04-11

## 2014-04-11 MED ORDER — MIDAZOLAM HCL 10 MG/2ML IJ SOLN
INTRAMUSCULAR | Status: AC
  Filled 2014-04-11: qty 4

## 2014-04-11 MED ORDER — SODIUM CHLORIDE 0.9 % IV SOLN: Freq: Once | INTRAVENOUS | Status: DC

## 2014-04-11 MED ORDER — FLUMAZENIL 0.5 MG/5ML IV SOLN
INTRAVENOUS | Status: DC | PRN
  Administered 2014-04-11: 0.2 mg via INTRAVENOUS

## 2014-04-11 MED ORDER — SODIUM CHLORIDE 0.9 % IV SOLN: INTRAVENOUS | Status: DC

## 2014-04-11 MED ORDER — LIDOCAINE HCL 2 % EX GEL
CUTANEOUS | Status: DC | PRN
  Administered 2014-04-11: 1

## 2014-04-11 NOTE — Progress Notes (Signed)
Per Dr. Gwenette Greet the pt can be discharged home with a SPO2 of 88% or greater as long as the pt is awake and alert. Burna Mortimer, rn

## 2014-04-11 NOTE — Progress Notes (Signed)
Video Bronchoscopy  Intervention Bronchial washings Intervention Bronchial biopsy

## 2014-04-11 NOTE — H&P (View-Only) (Signed)
   Subjective:    Patient ID: Diane Gilbert, female    DOB: June 22, 1966, 48 y.o.   MRN: [id number]  HPI The patient comes in today for followup of her abnormal CT chest and ongoing pulmonary symptoms. At the last visit, she was found to have a nodular interstitial process that was suggestive of RV ILD, sarcoid, or possibly hypersensitivity pneumonitis. The patient is a long-time smoker, and I discussed with her bronchoscopy versus a trial of smoking cessation with followup CT. The patient agreed to the latter, but unfortunately has not been able to stop smoking. She continues to have dyspnea on exertion, cough, and congestion.  She tells me that she has been labeled as having "COPD", but has never had pulmonary function studies. These will certainly need to be done.   Review of Systems  Constitutional: Negative for fever and unexpected weight change.  HENT: Positive for congestion and postnasal drip. Negative for dental problem, ear pain, nosebleeds, rhinorrhea, sinus pressure, sneezing, sore throat and trouble swallowing.   Eyes: Negative for redness and itching.  Respiratory: Positive for cough, chest tightness, shortness of breath and wheezing.   Cardiovascular: Positive for leg swelling. Negative for palpitations.  Gastrointestinal: Negative for nausea and vomiting.  Genitourinary: Negative for dysuria.  Musculoskeletal: Negative for joint swelling.  Skin: Negative for rash.  Neurological: Negative for headaches.  Hematological: Does not bruise/bleed easily.  Psychiatric/Behavioral: Negative for dysphoric mood. The patient is not nervous/anxious.        Objective:   Physical Exam Obese female in no acute distress Nose without purulence or discharge noted Neck without lymphadenopathy or thyromegaly Chest with some decreased breath sounds, a few basilar crackles, no wheeze Cardiac exam with regular rate and rhythm Lower extremities without edema, no cyanosis Alert and  oriented, moves all 4 extremities.       Assessment & Plan:

## 2014-04-11 NOTE — Op Note (Signed)
Dictation #:  [id number]

## 2014-04-11 NOTE — Interval H&P Note (Signed)
History and Physical Interval Note:  04/11/2014 8:15 AM  Diane Gilbert  has presented today for surgery, with the diagnosis of ILD  The various methods of treatment have been discussed with the patient and family. After consideration of risks, benefits and other options for treatment, the patient has consented to  Procedure(s): VIDEO BRONCHOSCOPY WITH FLUORO (Bilateral) as a surgical intervention .  The patient's history has been reviewed, patient examined, no change in status, stable for surgery.  I have reviewed the patient's chart and labs.  Questions were answered to the patient's satisfaction.     Kathee Delton

## 2014-04-11 NOTE — Discharge Instructions (Signed)
Flexible Bronchoscopy, Care After These instructions give you information on caring for yourself after your procedure. Your doctor may also give you more specific instructions. Call your doctor if you have any problems or questions after your procedure. HOME CARE  Do not eat or drink anything for 2 hours after your procedure. If you try to eat or drink before the medicine wears off, food or drink could go into your lungs. You could also burn yourself.  After 2 hours have passed and when you can cough and gag normally, you may eat soft food and drink liquids slowly.  The day after the test, you may eat your normal diet.  You may do your normal activities.  Keep all doctor visits. GET HELP RIGHT AWAY IF:  You get more and more short of breath.  You get light-headed.  You feel like you are going to pass out (faint).  You have chest pain.  You have new problems that worry you.  You cough up more than a little blood.  You cough up more blood than before. MAKE SURE YOU:  Understand these instructions.  Will watch your condition.  Will get help right away if you are not doing well or get worse. Document Released: 04/12/2009 Document Revised: 06/20/2013 Document Reviewed: 02/17/2013 [facility] Patient Information 2015 [place], [place]. This information is not intended to replace advice given to you by your health care provider. Make sure you discuss any questions you have with your health care provider.  Nothing to eat or drink 10:45 am     Until   Today 04/11/2014

## 2014-04-12 ENCOUNTER — Encounter (HOSPITAL_COMMUNITY): Payer: Medicaid Other

## 2014-04-12 ENCOUNTER — Encounter (HOSPITAL_COMMUNITY): Payer: Self-pay | Admitting: Pulmonary Disease

## 2014-04-12 NOTE — Op Note (Signed)
NAMEMarland Gilbert  KRISSA, UTKE NO.:  [id number]  MEDICAL RECORD NO.:  [id number]  LOCATION:  [facility]                         FACILITY:  [facility]  PHYSICIAN:  Kathee Delton, MD,FCCPDATE OF BIRTH:  1966-06-23  DATE OF PROCEDURE:  04/11/2014 DATE OF DISCHARGE:  04/11/2014                              OPERATIVE REPORT   PROCEDURE:  Flexible fiberoptic bronchoscopy with biopsy.  INDICATION FOR THE PROCEDURE:  Abnormal CT chest with a nodular interstitial process primarily located in the upper lobes.  OPERATOR:  Kathee Delton, MD, [facility]  ANESTHESIA:  Versed 10 mg IV, Demerol 75 mg IV, and topical 1% lidocaine in the vocal cords and airways during the procedure.  The patient also received 0.2 mg of Romazicon postprocedure for reversal.  DESCRIPTION OF PROCEDURE:  After obtaining informed consent and under close cardiopulmonary monitoring, the above preop anesthesia was given and the fiberoptic scope was passed to the right naris and into the posterior pharynx where there was no lesions or other abnormalities seen.  The vocal cords appeared to be within normal limits and moved bilaterally on phonation.  Scope was then passed into the trachea, where it was examined along its entire length down to the level of the carina, all of which was normal.  The left to right tracheobronchial trees were examined serially at the subsegmental level with no endobronchial abnormality being found.  Attention was then paid to the anterior and posterior segment of the right upper lobe where bronchoalveolar lavage was done, with good material being obtained.  I also did a BAL from the apical segment of the left upper lobe.  This was sent for the usual cytologic and bacteriologic evaluation.  Under fluoroscopic guidance, the patient had transbronchial lung biopsies taken from the anterior and posterior segments of the right upper lobe with good biopsies being obtained.  The patient had evidence for  upper airway obstruction with loud snoring and falling oxygen saturations.  In response to this, a jaw thrust was done which improved saturations up into the 70s and low 80s. Because of concern about the patient's upper airway obstruction, the patient was given Romazicon 0.2 mg with significant improvement in her mentation, improved upper airway tone, and return of her saturations into the high 90s on a 100%.  The patient was able to have her oxygen weaned back to nasal cannula and remained completely stable.  There was good hemostasis maintained after transbronchial lung biopsies.  A chest x-ray post procedure showed no evidence for pneumothorax.     Kathee Delton, MD,FCCP     [facility]  D:  04/11/2014  T:  04/12/2014  Job:  [id number]

## 2014-04-13 ENCOUNTER — Telehealth: Payer: Self-pay | Admitting: Pulmonary Disease

## 2014-04-13 LAB — CULTURE, BAL-QUANTITATIVE W GRAM STAIN

## 2014-04-13 LAB — CULTURE, BAL-QUANTITATIVE
Colony Count: >=100000
Special Requests: NORMAL

## 2014-04-13 NOTE — Telephone Encounter (Signed)
Joydan Gretzinger, see if the pt can come in next week to review biopsy results. Thanks.  Called pt. She is scheduled to see [facility] Monday AM at 9:30. nothing further needed

## 2014-04-16 ENCOUNTER — Encounter: Payer: Self-pay | Admitting: Pulmonary Disease

## 2014-04-16 ENCOUNTER — Ambulatory Visit (INDEPENDENT_AMBULATORY_CARE_PROVIDER_SITE_OTHER): Payer: Medicaid Other | Admitting: Pulmonary Disease

## 2014-04-16 ENCOUNTER — Other Ambulatory Visit: Payer: Medicaid Other

## 2014-04-16 VITALS — BP 126/60 | HR 78 | Temp 97.1°F | Ht 64.0 in | Wt 242.4 lb

## 2014-04-16 DIAGNOSIS — J84115 Respiratory bronchiolitis interstitial lung disease: Secondary | ICD-10-CM

## 2014-04-16 MED ORDER — PREDNISONE 10 MG PO TABS: ORAL_TABLET | ORAL | Status: DC

## 2014-04-16 NOTE — Assessment & Plan Note (Signed)
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 I cannot exclude chronic hypersensitivity pneumonitis, and we'll therefore check blood work today for this. I would have expected HP or sarcoid to have shown up on the lung biopsies. It is possible that it is a false negative result. RB ILD cannot be made on a transbronchial lung biopsy because of the biopsy size. I have had a long discussion with her about all of this, and have told her that RB ILD will not resolve until she totally stops smoking.  I would like to try her on a course of prednisone to try and make her airway symptoms better, but she understands this is not a great treatment for RB ILD. I would like to avoid long-term steroids if at all possible. Will also hold off on inhaled bronchodilators since her spirometry did not really show a lot of airflow obstruction. I also considered starting her on ambulatory oxygen, but would like to see how she does with a course of prednisone and after she has tried to stop smoking.

## 2014-04-16 NOTE — Patient Instructions (Signed)
Will check bloodwork today for environmental allergies. Will start on prednisone for the next 3 weeks, and will see you back (prednisone 47m each day for 5 days, then 36meach day for 5 days, then 2041mach day until I see you back).   Will check chest xray at next visit. You MUST stop smoking!!  Your lung disease will not improve until you do this.  followup with me in 3 weeks.

## 2014-04-16 NOTE — Progress Notes (Signed)
Subjective:    Patient ID: Diane Gilbert, female    DOB: [birth year], 48 y.o.   MRN: [id number]  HPI The patient today comes in for followup of her recent bronchoscopy results and PFTs. She was found to have no airflow obstruction, significant restriction from her obesity, mild air-trapping, and a significant decrease in diffusion capacity. She underwent bronchoscopy where her bronchoalveolar lavage showed a significant number of eosinophils, however there was only a moderate number of white cells present. Her transbronchial biopsy showed normal lung parenchyma. I have reviewed the results with her in detail, and answered all of her questions.  Review of Systems  Constitutional: Negative for fever and unexpected weight change.  HENT: Negative for congestion, dental problem, ear pain, nosebleeds, postnasal drip, rhinorrhea, sinus pressure, sneezing, sore throat and trouble swallowing.   Eyes: Negative for redness and itching.  Respiratory: Positive for cough, shortness of breath and wheezing. Negative for chest tightness.   Cardiovascular: Positive for leg swelling. Negative for palpitations.  Gastrointestinal: Negative for nausea and vomiting.  Genitourinary: Negative for dysuria.  Musculoskeletal: Negative for joint swelling.  Skin: Negative for rash.  Neurological: Negative for headaches.  Hematological: Does not bruise/bleed easily.  Psychiatric/Behavioral: Negative for dysphoric mood. The patient is not nervous/anxious.        Objective:   Physical Exam Morbidly obese female in no acute distress Nose without purulence or discharge noted Neck without lymphadenopathy or thyromegaly Chest with basilar crackles, no wheezes Cardiac exam with regular rate and rhythm Lower extremities with minimal ankle edema, no cyanosis Alert and oriented, moves all 4 extremities.       Assessment & Plan:

## 2014-04-23 LAB — HYPERSENSITIVITY PNUEMONITIS PROFILE

## 2014-05-09 LAB — FUNGUS CULTURE W SMEAR
Fungal Smear: NONE SEEN
Special Requests: NORMAL

## 2014-05-10 ENCOUNTER — Ambulatory Visit (INDEPENDENT_AMBULATORY_CARE_PROVIDER_SITE_OTHER)
Admission: RE | Admit: 2014-05-10 | Discharge: 2014-05-10 | Disposition: A | Discharge status: Home / Self Care | Payer: Medicaid Other | Source: Ambulatory Visit | Attending: Pulmonary Disease | Admitting: Pulmonary Disease

## 2014-05-10 ENCOUNTER — Ambulatory Visit (INDEPENDENT_AMBULATORY_CARE_PROVIDER_SITE_OTHER): Payer: Medicaid Other | Admitting: Pulmonary Disease

## 2014-05-10 ENCOUNTER — Encounter: Payer: Self-pay | Admitting: Pulmonary Disease

## 2014-05-10 VITALS — BP 114/60 | HR 94 | Temp 96.8°F | Ht 64.0 in | Wt 237.8 lb

## 2014-05-10 DIAGNOSIS — J84115 Respiratory bronchiolitis interstitial lung disease: Secondary | ICD-10-CM

## 2014-05-10 DIAGNOSIS — F17200 Nicotine dependence, unspecified, uncomplicated: Secondary | ICD-10-CM

## 2014-05-10 NOTE — Patient Instructions (Addendum)
Stop spiriva inhaler Will start on qvar 80 2 inhalations am and pm everyday with spacer.  Rinse mouth out well.  You MUST stop smoking 100%.  Your lung disease is being caused by smoking, and will not improve until you quit. One of your blood pressure medications is causing your sensation of choking with cough.  Will arrange for you to see Dr. Jeanie Cooks about getting this changed.  Can use albuterol for rescue, but do not use unless you are having a really hard time breathing.  Finish up your prednisone taper as directed. followup with me again in 4 weeks.

## 2014-05-10 NOTE — Assessment & Plan Note (Signed)
The patient continues to have issues with ongoing cough and shortness of breath, however continues to smoke. 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 She is describing a lot of upper airway symptoms with throat tightening and choking, and I notice that she is on an ACE inhibitor. We have called her primary physician's office, and left a message with the nurse to get her off of this medication. She has an upcoming appointment with her primary physician. The patient does not have significant airflow limitation on her PFTs, but she did have some eosinophilia on her BAL. I will therefore start her on inhaled corticosteroids with a spacer to see if this helps her ongoing symptoms. Overall, it is really difficult to tell if she had a response to the course of prednisone. Of note, her chest x-ray today is unchanged.

## 2014-05-10 NOTE — Progress Notes (Signed)
   Subjective:    Patient ID: Diane Gilbert, female    DOB: 1966-02-21, 48 y.o.   MRN: [id number]  HPI The patient comes in today for follow-up of her abnormal chest x-ray, felt to represent RB ILD. She had an unremarkable bronchoscopy except for mild eosinophilia on her BAL, and most recently was treated with a course of steroids and asked to stop smoking 100%. She comes in today where she continues to smoke, and it is really unclear from questioning whether she saw any difference with the prednisone. Her follow-up chest x-ray today is stable. She has a new complaint of increasing cough with throat tightening and a sensation of choking. She denies any significant postnasal drip or reflux, but certainly is at risk for laryngopharyngeal reflux. She is on an ACE inhibitor, and should probably, off this medication.   Review of Systems  Constitutional: Negative for fever and unexpected weight change.  HENT: Positive for congestion, postnasal drip and sinus pressure. Negative for dental problem, ear pain, nosebleeds, rhinorrhea, sneezing, sore throat and trouble swallowing.   Eyes: Positive for redness. Negative for itching.  Respiratory: Positive for cough, shortness of breath and wheezing. Negative for chest tightness.   Cardiovascular: Positive for palpitations and leg swelling.  Gastrointestinal: Negative for nausea and vomiting.  Genitourinary: Negative for dysuria.  Musculoskeletal: Negative for joint swelling.  Skin: Negative for rash.  Neurological: Negative for headaches.  Hematological: Does not bruise/bleed easily.  Psychiatric/Behavioral: Positive for dysphoric mood. The patient is nervous/anxious.        Objective:   Physical Exam Obese female in no acute distress Nose without purulence or discharge noted Neck without lymphadenopathy or thyromegaly Chest with decreased depth of inspiration, but no crackles or wheezes Cardiac exam with regular rate and rhythm Lower  extremities with mild edema, no cyanosis Alert and oriented, moves all 4 extremities.       Assessment & Plan:

## 2014-05-25 LAB — AFB CULTURE WITH SMEAR (NOT AT ARMC)
Acid Fast Smear: NONE SEEN
SPECIAL REQUESTS: NORMAL

## 2014-06-07 ENCOUNTER — Telehealth: Payer: Self-pay | Admitting: Pulmonary Disease

## 2014-06-07 ENCOUNTER — Ambulatory Visit (INDEPENDENT_AMBULATORY_CARE_PROVIDER_SITE_OTHER): Payer: Medicaid Other | Admitting: Pulmonary Disease

## 2014-06-07 ENCOUNTER — Encounter: Payer: Self-pay | Admitting: Pulmonary Disease

## 2014-06-07 VITALS — BP 134/72 | HR 90 | Temp 97.0°F | Ht 64.0 in | Wt 240.6 lb

## 2014-06-07 DIAGNOSIS — J9611 Chronic respiratory failure with hypoxia: Secondary | ICD-10-CM

## 2014-06-07 DIAGNOSIS — Z23 Encounter for immunization: Secondary | ICD-10-CM

## 2014-06-07 DIAGNOSIS — J84115 Respiratory bronchiolitis interstitial lung disease: Secondary | ICD-10-CM

## 2014-06-07 DIAGNOSIS — J9621 Acute and chronic respiratory failure with hypoxia: Secondary | ICD-10-CM | POA: Insufficient documentation

## 2014-06-07 NOTE — Assessment & Plan Note (Signed)
The patient continues to do poorly from a breathing and oxygenation standpoint, but is also continuing to smoke excessively. We have presumed based on the CT appearance that her abnormal findings are secondary to RB ILD, which typically will not resolve until the patient totally quit smoking. However, groundglass opacities and nodular densities can also be seen in hypersensitivity pneumonitis and sarcoidosis. Her transbronchial biopsies did not show an acute process, and this would typically leave me toward RB ILD. She was treated with a course of prednisone without a significant response, and was also started on inhaled corticosteroids at the last visit because of eosinophilia on her BAL. However, she had a small number of white cells, and it is unclear if this is a significant finding. She is having progressive dyspnea on exertion, and is also having increased hypoxemia. She will obviously need to be started on supplemental oxygen. I have asked her to stay on her inhaled corticosteroids, and to try and stop smoking 100%. However, I think she needs to have a thoracoscopic lung biopsy so that we can try and accurately identify her pulmonary process. If this is RB ILD, then the only appropriate treatment is smoking cessation. However, if another diagnosis is made, she may respond to other medications. I will also check an echocardiogram since she is having some increased lower extremity edema, and may have diastolic or systolic dysfunction.

## 2014-06-07 NOTE — Progress Notes (Signed)
   Subjective:    Patient ID: Diane Gilbert, female    DOB: 11-21-1965, 48 y.o.   MRN: [id number]  HPI The patient comes in today for follow-up of her abnormal CT chest and dyspnea. She is felt to have possible RB ILD, but has not responded to a course of steroids. However, she has continued to smoke despite me asking her to stop. She was also started on inhaled corticosteroids at the last visit because of eosinophilia seen on her BAL. However, the BAL was not very cellular. Surprisingly, she does not have any significant COPD by her PFTs. She continues to have significant dyspnea on exertion, and her sats today are 83% on room air. She has also had some lower extremity edema.  I continued to have concerns about her medical compliance, given her mental disabilities.   Review of Systems  Constitutional: Negative for fever and unexpected weight change.  HENT: Positive for postnasal drip. Negative for congestion, dental problem, ear pain, nosebleeds, rhinorrhea, sinus pressure, sneezing, sore throat and trouble swallowing.   Eyes: Negative for redness and itching.  Respiratory: Positive for cough and shortness of breath. Negative for chest tightness and wheezing.   Cardiovascular: Positive for leg swelling. Negative for palpitations.  Gastrointestinal: Positive for vomiting. Negative for nausea.  Genitourinary: Negative for dysuria.  Musculoskeletal: Negative for joint swelling.  Skin: Negative for rash.  Neurological: Negative for headaches.  Hematological: Does not bruise/bleed easily.  Psychiatric/Behavioral: Negative for dysphoric mood. The patient is not nervous/anxious.        Objective:   Physical Exam Obese female in no acute distress Nose without purulence or discharge noted Neck without lymphadenopathy or thyromegaly Chest with poor depth of inspiration, basilar crackles, but no active wheezing Cardiac exam with distant but regular rhythm. No murmurs noted Lower extremity  with mild edema, no cyanosis Alert and oriented, moves all 4 extremities.       Assessment & Plan:

## 2014-06-07 NOTE — Patient Instructions (Signed)
You need to stop smoking. Will refer you to a chest surgeon to get a larger size biopsy of your lung to try and find out what is causing your xray abnormalities. Will schedule for sound wave test of your heart. Will start on oxygen 24 hrs a day to help your breathing.

## 2014-06-07 NOTE — Telephone Encounter (Signed)
Melissa with [facility] is aware that corrected order in EPIC. Nothing more needed at this time.

## 2014-06-11 ENCOUNTER — Institutional Professional Consult (permissible substitution) (INDEPENDENT_AMBULATORY_CARE_PROVIDER_SITE_OTHER): Payer: Medicaid Other | Admitting: Cardiothoracic Surgery

## 2014-06-11 ENCOUNTER — Encounter: Payer: Self-pay | Admitting: Cardiothoracic Surgery

## 2014-06-11 VITALS — BP 150/82 | HR 87 | Resp 20 | Ht 64.0 in | Wt 238.0 lb

## 2014-06-11 DIAGNOSIS — J849 Interstitial pulmonary disease, unspecified: Secondary | ICD-10-CM

## 2014-06-11 NOTE — Progress Notes (Signed)
[address]iane Gilbert [address]       [place] [address]             [phone]                    Diane Gilbert [facility] Medical Record #[id number] Date of Birth: 1965-10-06  Referring: Kathee Delton, MD Primary Care: Philis Fendt, MD  Chief Complaint:    Chief Complaint  Patient presents with  . Interstitial Lung Disease    Surgical eval    History of Present Illness:    Diane Gilbert 48 y.o. female is seen in the office  today at the request of Dr. Gwenette Greet to consider thorascopic lung biopsy. The patient has had progressive worsening of her respiratory status, to the point where last week she was started on oxygen. Previous bronchoscopy and BAL have been unrevealing, the patient was started on steroids without much response. Unfortunately she's been a smoker and at times up to 3 packs a day for more than 25 years. She's now down to several cigarettes a day.  She also notes that she is become short of breath lying down and sleeps in a chair. In the office today she had erythematous butterfly rash across her cheeks, when asked about this she notes that it's been there for a long time but doesn't remember how long. The patient is a very poor historian and unable to give many details about her past history. She notes there is no mold in her apartment, has no pets, and no occupational exposures.      Current Activity/ Functional Status:  Patient is independent with mobility/ambulation, transfers, ADL's, IADL's.  patient currently lives alone  Zubrod Score: At the time of surgery this patient's most appropriate activity status/level should be described as: _0     0    Normal activity, no symptoms _1     1    Restricted in physical strenuous activity but ambulatory, able to do out light work _2     2    Ambulatory and capable of self care, unable to do work activities, up and about               >50 % of waking hours                              _3     3    Only  limited self care, in bed greater than 50% of waking hours _4     4    Completely disabled, no self care, confined to bed or chair _5     5    Moribund   Past Medical History  Diagnosis Date  . Alcohol abuse   . Hypertension   . Insomnia   . Anxiety   . Depression   . Headache(784.0)   . Arthritis     Past Surgical History  Procedure Laterality Date  . Back surgery    . Video bronchoscopy Bilateral 04/11/2014    Procedure: VIDEO BRONCHOSCOPY WITH FLUORO;  Surgeon: Kathee Delton, MD;  Location: [facility];  Service: Cardiopulmonary;  Laterality: Bilateral;    Family History  Problem Relation Age of Onset  . COPD Maternal Aunt   . Breast cancer Mother    patient has 4 brothers one deceased from trauma and 2 sisters none have had any pulmonary problems, both her mother and father alive she does not  know any of the health history of her father she has no children  History   Social History  . Marital Status: Single    Spouse Name: N/A    Number of Children: N/A  . Years of Education: N/A   Occupational History  . unemployed    Social History Main Topics  . Smoking status: Current Every Day Smoker -- 1.00 packs/day for 30 years    Types: Cigarettes  . Smokeless tobacco: Never Used     Comment: Smokes 1-2 cigs daily 06/07/14  . Alcohol Use: 0.0 oz/week    0 Not specified per week     Comment: Pt has previously abused alcohol , pt states she still drinks "every now and then"  . Drug Use: No  . Sexual Activity: Not Currently     Comment: husband locked up    Other Topics Concern  . Not on file   Social History Narrative    History  Smoking status  . Current Every Day Smoker -- 1.00 packs/day for 30 years  . Types: Cigarettes  Smokeless tobacco  . Never Used    Comment: Smokes 1-2 cigs daily 06/07/14    History  Alcohol Use  . 0.0 oz/week  . 0 Not specified per week    Comment: Pt has previously abused alcohol , pt states she still drinks "every now and  then"     No Known Allergies  Current Outpatient Prescriptions  Medication Sig Dispense Refill  . albuterol (PROVENTIL HFA;VENTOLIN HFA) 108 (90 BASE) MCG/ACT inhaler Inhale 2 puffs into the lungs every 6 (six) hours as needed for wheezing or shortness of breath. 1 Inhaler 3  . amLODipine (NORVASC) 5 MG tablet Take 5 mg by mouth daily.  0  . ARIPiprazole (ABILIFY) 5 MG tablet Take 25 mg by mouth daily.     . beclomethasone (QVAR) 80 MCG/ACT inhaler Inhale 2 puffs into the lungs 2 (two) times daily.    Marland Kitchen buPROPion (WELLBUTRIN SR) 150 MG 12 hr tablet Take 150 mg by mouth 2 (two) times daily.    . clotrimazole (LOTRIMIN) 1 % cream Apply 1 application topically 2 (two) times daily.    Marland Kitchen gabapentin (NEURONTIN) 100 MG capsule Take 100-400 mg by mouth 4 (four) times daily. Takes 100 mg 3 times daily and 400 mg at bedtime    . oxyCODONE-acetaminophen (PERCOCET/ROXICET) 5-325 MG per tablet As needed  0  . QUEtiapine (SEROQUEL) 100 MG tablet Take 100 mg by mouth at bedtime.    . traZODone (DESYREL) 150 MG tablet Take 150 mg by mouth at bedtime.     No current facility-administered medications for this visit.     Review of Systems:     Cardiac Review of Systems: Y or N  Chest Pain [ n   ]  Resting SOB [  y ] Exertional SOB  [ y ]  Orthopnea [ y ]   Pedal Edema [ marked  ]    Palpitations [n  ] Syncope  [n  ]   Presyncope [ n  ]  General Review of Systems: [Y] = yes [  ]=no Constitional: recent weight change [ n ];  Wt loss over the last 3 months [   ] anorexia [  ]; fatigue [  ]; nausea [  ]; night sweats [  ]; fever [  ]; or chills [  ];          Dental: poor dentition[  ]; Last Dentist visit:  Eye : blurred vision [  ]; diplopia [   ]; vision changes [  ];  Amaurosis fugax[  ]; Resp: cough [ y ];  wheezing[y  ];  hemoptysis[ n ]; shortness of breath[ y ]; paroxysmal nocturnal dyspnea[ y ]; dyspnea on exertion[ y ]; or orthopnea[ y ];  GI:  gallstones[  ], vomiting[  ];  dysphagia[  ]; melena[   ];  hematochezia [  n]; heartburn[  ];   Hx of  Colonoscopy[  ]; GU: kidney stones [  ]; hematuria[  ];   dysuria [  ];  nocturia[  ];  history of     obstruction [  ]; urinary frequency [  ]             Skin: rash, swelling[  ];, hair loss[  ];  peripheral edema[  ];  or itching[  ]; Musculosketetal: myalgias[  ];  joint swelling[  ];  joint erythema[  ];  joint pain[  ];  back pain[  ];  Heme/Lymph: bruising[  ];  bleeding[  ];  anemia[  ];  Neuro: TIA[  ];  headaches[  ];  stroke[ n ];  vertigo[  ];  seizures[  ];   paresthesias[  ];  difficulty walking[ y ];  Psych:depression[y  ]; anxiety[ y ];  Endocrine: diabetes[ n ];  thyroid dysfunction[n  ];  Immunizations: Flu up to date [ y ]; Pneumococcal up to date [n  ];  Other:  Physical Exam: BP 150/82 mmHg  Pulse 87  Resp 20  Ht _0  (1.626 m)  Wt 238 lb (107.956 kg)  BMI 40.83 kg/m2  SpO2 90%  PHYSICAL EXAMINATION:  General appearance: alert, cooperative, appears older than stated age, mild distress, moderately obese and slowed mentation Neurologic: intact Heart: regular rate and rhythm, S1, S2 normal, no murmur, click, rub or gallop Lungs: Bilateral crackles, without active wheezing Abdomen: soft, non-tender; bowel sounds normal; no masses,  no organomegaly Extremities: edema 2+ pitting edema both ankles Patient has no carotid bruits As noted she has an erythematous rash across cheeks  Diagnostic Studies & Laboratory data:     Recent Radiology Findings:  CLINICAL DATA: Pulmonary nodule. Abnormal chest radiograph. Question interstitial lung disease.  EXAM: CT CHEST WITHOUT CONTRAST  TECHNIQUE: Multidetector CT imaging of the chest was performed following the standard protocol without intravenous contrast. High resolution imaging of the lungs, as well as inspiratory and expiratory imaging, was performed.  COMPARISON: Chest radiographs 01/10/2014 and 10/19/2013.  FINDINGS: No pathologically enlarged  mediastinal or axillary lymph nodes. Hilar regions are difficult to definitively evaluate without IV contrast but appear grossly unremarkable. Heart size normal. No pericardial effusion. There may be a tiny hiatal hernia.  Image quality is degraded by respiratory motion. A somewhat upper lobe predominant pattern of ground-glass and centrilobular nodularity is seen superimposed on mild centrilobular emphysema. Scattered linear pulmonary parenchymal scarring. No pulmonary nodule. No traction bronchiectasis/ bronchiolectasis, architectural distortion or honeycombing. No subpleural reticulation. There may be mild air trapping. No pleural fluid. Airway is unremarkable.  Incidental imaging of the upper abdomen shows the visualized portions of the liver, adrenal glands, spleen, pancreas and stomach to be grossly unremarkable. No upper abdominal adenopathy. No worrisome lytic or sclerotic lesions. A healed anterior right first rib fracture accounts for the questioned abnormality on recent chest radiographs.  IMPRESSION: 1. 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 Subacute hypersensitivity pneumonitis is not excluded. 2. A healed anterior right first rib fracture accounts for the questioned abnormality on recent chest radiographs.   Electronically Signed  By: Lorin Picket M.D.  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 Gwenette Greet if this patient has been worked up for lupus  I recommend to the patient that we could proceed with thoracoscopic lung biopsy, but that she needed to completely abstain from any cigarette smoking. She wanted to wait until January to proceed with this. She is to have an echocardiogram next week.  I will see her back in January 14,  and at that time decide on a definitive date on biopsy. Patient will talk with her family members and arrange help at home after discharge.      I spent 40 minutes counseling the patient face to face. The total time spent in the appointment was 60 minutes.  Grace Isaac MD      [address] [place] [address] Office [phone]   Beeper [phone]  06/11/2014 5:25 PM

## 2014-06-14 ENCOUNTER — Ambulatory Visit (HOSPITAL_COMMUNITY): Payer: Medicaid Other | Attending: Cardiology | Admitting: Radiology

## 2014-06-14 DIAGNOSIS — R0600 Dyspnea, unspecified: Secondary | ICD-10-CM

## 2014-06-14 DIAGNOSIS — R600 Localized edema: Secondary | ICD-10-CM | POA: Diagnosis present

## 2014-06-14 DIAGNOSIS — J9610 Chronic respiratory failure, unspecified whether with hypoxia or hypercapnia: Secondary | ICD-10-CM | POA: Insufficient documentation

## 2014-06-14 DIAGNOSIS — N289 Disorder of kidney and ureter, unspecified: Secondary | ICD-10-CM | POA: Diagnosis not present

## 2014-06-14 DIAGNOSIS — J9611 Chronic respiratory failure with hypoxia: Secondary | ICD-10-CM

## 2014-06-14 NOTE — Progress Notes (Signed)
Echocardiogram performed.  

## 2014-06-19 ENCOUNTER — Other Ambulatory Visit: Payer: Self-pay | Admitting: Pulmonary Disease

## 2014-06-19 DIAGNOSIS — J84115 Respiratory bronchiolitis interstitial lung disease: Secondary | ICD-10-CM

## 2014-07-12 ENCOUNTER — Ambulatory Visit (INDEPENDENT_AMBULATORY_CARE_PROVIDER_SITE_OTHER): Payer: Medicaid Other | Admitting: Cardiothoracic Surgery

## 2014-07-12 ENCOUNTER — Encounter: Payer: Self-pay | Admitting: Cardiothoracic Surgery

## 2014-07-12 ENCOUNTER — Other Ambulatory Visit: Payer: Self-pay | Admitting: *Deleted

## 2014-07-12 VITALS — BP 144/88 | HR 92 | Resp 20 | Ht 64.0 in | Wt 238.0 lb

## 2014-07-12 DIAGNOSIS — J849 Interstitial pulmonary disease, unspecified: Secondary | ICD-10-CM

## 2014-07-12 NOTE — Progress Notes (Signed)
[address]       [place] [address]             [phone]                    Ajeenah S Tibbs [facility] Medical Record #[id number] Date of Birth: April 04, 1966  Referring: Kathee Delton, MD Primary Care: Philis Fendt, MD  Chief Complaint:    Chief Complaint  Patient presents with  . Interstitial Lung Disease    further discuss scheduling BX     History of Present Illness:    Diane Gilbert 49 y.o. female is seen in the office  today at the request of Dr. Gwenette Greet to consider thorascopic lung biopsy. The patient has had progressive worsening of her respiratory status, to the point where last week she was started on oxygen. Previous bronchoscopy and BAL have been unrevealing, the patient was started on steroids without much response. Unfortunately she's been a smoker and at times up to 3 packs a day for more than 25 years. She's now down to several cigarettes a day.  She also notes that she is become short of breath lying down and sleeps in a chair. In the office today she had erythematous butterfly rash across her cheeks, when asked about this she notes that it's been there for a long time but doesn't remember how long. The patient is a very poor historian and unable to give many details about her past history. She notes there is no mold in her apartment, has no pets, and no occupational exposures.   The patient has arrange for family members to come and stay with her postoperatively, she comes in today to further discuss thorascopic lung biopsy.   Current Activity/ Functional Status:  Patient is independent with mobility/ambulation, transfers, ADL's, IADL's.  patient currently lives alone  Zubrod Score: At the time of surgery this patient's most appropriate activity status/level should be described as: _0     0    Normal activity, no symptoms _1     1    Restricted in physical strenuous activity but ambulatory, able to do out light work _2     2     Ambulatory and capable of self care, unable to do work activities, up and about               >50 % of waking hours                              _3     3    Only limited self care, in bed greater than 50% of waking hours _4     4    Completely disabled, no self care, confined to bed or chair _5     5    Moribund   Past Medical History  Diagnosis Date  . Alcohol abuse   . Hypertension   . Insomnia   . Anxiety   . Depression   . Headache(784.0)   . Arthritis     Past Surgical History  Procedure Laterality Date  . Back surgery    . Video bronchoscopy Bilateral 04/11/2014    Procedure: VIDEO BRONCHOSCOPY WITH FLUORO;  Surgeon: Kathee Delton, MD;  Location: [facility];  Service: Cardiopulmonary;  Laterality: Bilateral;    Family History  Problem Relation Age of Onset  . COPD Maternal Aunt   . Breast cancer Mother  patient has 4 brothers one deceased from trauma and 2 sisters none have had any pulmonary problems, both her mother and father alive she does not know any of the health history of her father she has no children  History   Social History  . Marital Status: Single    Spouse Name: N/A    Number of Children: N/A  . Years of Education: N/A   Occupational History  . unemployed    Social History Main Topics  . Smoking status: Current Every Day Smoker -- 1.00 packs/day for 30 years    Types: Cigarettes  . Smokeless tobacco: Never Used     Comment: Smokes 1-2 cigs daily 06/07/14  . Alcohol Use: 0.0 oz/week    0 Not specified per week     Comment: Pt has previously abused alcohol , pt states she still drinks "every now and then"  . Drug Use: No  . Sexual Activity: Not Currently     Comment: husband locked up    Other Topics Concern  . Not on file   Social History Narrative    History  Smoking status  . Current Every Day Smoker -- 1.00 packs/day for 30 years  . Types: Cigarettes  Smokeless tobacco  . Never Used    Comment: Smokes 1-2 cigs daily 06/07/14      History  Alcohol Use  . 0.0 oz/week  . 0 Not specified per week    Comment: Pt has previously abused alcohol , pt states she still drinks "every now and then"     No Known Allergies  Current Outpatient Prescriptions  Medication Sig Dispense Refill  . albuterol (PROVENTIL HFA;VENTOLIN HFA) 108 (90 BASE) MCG/ACT inhaler Inhale 2 puffs into the lungs every 6 (six) hours as needed for wheezing or shortness of breath. 1 Inhaler 3  . amLODipine (NORVASC) 5 MG tablet Take 5 mg by mouth daily.  0  . ARIPiprazole (ABILIFY) 5 MG tablet Take 25 mg by mouth daily.     . beclomethasone (QVAR) 80 MCG/ACT inhaler Inhale 2 puffs into the lungs 2 (two) times daily.    Marland Kitchen buPROPion (WELLBUTRIN SR) 150 MG 12 hr tablet Take 150 mg by mouth 2 (two) times daily.    . clotrimazole (LOTRIMIN) 1 % cream Apply 1 application topically 2 (two) times daily.    . furosemide (LASIX) 20 MG tablet Take 20 mg by mouth daily.   0  . gabapentin (NEURONTIN) 100 MG capsule Take 100-400 mg by mouth 4 (four) times daily. Takes 100 mg 3 times daily and 400 mg at bedtime    . oxyCODONE-acetaminophen (PERCOCET/ROXICET) 5-325 MG per tablet As needed  0  . QUEtiapine (SEROQUEL) 100 MG tablet Take 100 mg by mouth at bedtime.    . traZODone (DESYREL) 150 MG tablet Take 150 mg by mouth at bedtime.     No current facility-administered medications for this visit.     Review of Systems:     Cardiac Review of Systems: Y or N  Chest Pain [ n   ]  Resting SOB [  y ] Exertional SOB  [ y ]  Orthopnea [ y ]   Pedal Edema [ marked  ]    Palpitations [n  ] Syncope  [n  ]   Presyncope [ n  ]  General Review of Systems: [Y] = yes [  ]=no Constitional: recent weight change [ n ];  Wt loss over the last 3 months [   ]  anorexia [  ]; fatigue [  ]; nausea [  ]; night sweats [  ]; fever [  ]; or chills [  ];          Dental: poor dentition[  ]; Last Dentist visit:   Eye : blurred vision [  ]; diplopia [   ]; vision changes [  ];  Amaurosis  fugax[  ]; Resp: cough [ y ];  wheezing[y  ];  hemoptysis[ n ]; shortness of breath[ y ]; paroxysmal nocturnal dyspnea[ y ]; dyspnea on exertion[ y ]; or orthopnea[ y ];  GI:  gallstones[  ], vomiting[  ];  dysphagia[  ]; melena[  ];  hematochezia [  n]; heartburn[  ];   Hx of  Colonoscopy[  ]; GU: kidney stones [  ]; hematuria[  ];   dysuria [  ];  nocturia[  ];  history of     obstruction [  ]; urinary frequency [  ]             Skin: rash, swelling[  ];, hair loss[  ];  peripheral edema[  ];  or itching[  ]; Musculosketetal: myalgias[  ];  joint swelling[  ];  joint erythema[  ];  joint pain[  ];  back pain[  ];  Heme/Lymph: bruising[  ];  bleeding[  ];  anemia[  ];  Neuro: TIA[  ];  headaches[  ];  stroke[ n ];  vertigo[  ];  seizures[  ];   paresthesias[  ];  difficulty walking[ y ];  Psych:depression[y  ]; anxiety[ y ];  Endocrine: diabetes[ n ];  thyroid dysfunction[n  ];  Immunizations: Flu up to date [ y ]; Pneumococcal up to date [n  ];  Other:  Physical Exam: BP 144/88 mmHg  Pulse 92  Resp 20  Ht _0  (1.626 m)  Wt 238 lb (107.956 kg)  BMI 40.83 kg/m2  SpO2 95%  PHYSICAL EXAMINATION:  General appearance: alert, cooperative, appears older than stated age, mild distress, moderately obese and slowed mentation Neurologic: intact Heart: regular rate and rhythm, S1, S2 normal, no murmur, click, rub or gallop Lungs: Bilateral crackles, without active wheezing Abdomen: soft, non-tender; bowel sounds normal; no masses,  no organomegaly Extremities: edema 2+ pitting edema both ankles Patient has no carotid bruits As noted she has an erythematous rash across cheeks  Diagnostic Studies & Laboratory data:     Recent Radiology Findings:  CLINICAL DATA: Pulmonary nodule. Abnormal chest radiograph. Question interstitial lung disease.  EXAM: CT CHEST WITHOUT CONTRAST  TECHNIQUE: Multidetector CT imaging of the chest was performed following the standard protocol without  intravenous contrast. High resolution imaging of the lungs, as well as inspiratory and expiratory imaging, was performed.  COMPARISON: Chest radiographs 01/10/2014 and 10/19/2013.  FINDINGS: No pathologically enlarged mediastinal or axillary lymph nodes. Hilar regions are difficult to definitively evaluate without IV contrast but appear grossly unremarkable. Heart size normal. No pericardial effusion. There may be a tiny hiatal hernia.  Image quality is degraded by respiratory motion. A somewhat upper lobe predominant pattern of ground-glass and centrilobular nodularity is seen superimposed on mild centrilobular emphysema. Scattered linear pulmonary parenchymal scarring. No pulmonary nodule. No traction bronchiectasis/ bronchiolectasis, architectural distortion or honeycombing. No subpleural reticulation. There may be mild air trapping. No pleural fluid. Airway is unremarkable.  Incidental imaging of the upper abdomen shows the visualized portions of the liver, adrenal glands, spleen, pancreas and stomach to be grossly unremarkable. No upper abdominal adenopathy. No  worrisome lytic or sclerotic lesions. A healed anterior right first rib fracture accounts for the questioned abnormality on recent chest radiographs.  IMPRESSION: 1. 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 Subacute hypersensitivity pneumonitis is not excluded. 2. A healed anterior right first rib fracture accounts for the questioned abnormality on recent chest radiographs.   Electronically Signed  By: Lorin Picket M.D.  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   I spent 20 minutes counseling the patient face to face. The total time spent in the appointment was 20 minutes.  Grace Isaac MD      [address] [place] [address] Office [phone]   Beeper [phone]  07/12/2014 1:08 PM

## 2014-07-13 ENCOUNTER — Other Ambulatory Visit: Payer: Self-pay

## 2014-07-13 ENCOUNTER — Encounter (HOSPITAL_COMMUNITY)
Admission: RE | Admit: 2014-07-13 | Discharge: 2014-07-13 | Disposition: A | Discharge status: Home / Self Care | Payer: Medicaid Other | Source: Ambulatory Visit | Attending: Cardiothoracic Surgery | Admitting: Cardiothoracic Surgery

## 2014-07-13 ENCOUNTER — Encounter (HOSPITAL_COMMUNITY): Payer: Self-pay

## 2014-07-13 VITALS — BP 131/71 | HR 86 | Temp 98.6°F | Resp 20 | Ht 64.0 in | Wt 240.0 lb

## 2014-07-13 DIAGNOSIS — Z01811 Encounter for preprocedural respiratory examination: Secondary | ICD-10-CM

## 2014-07-13 DIAGNOSIS — J849 Interstitial pulmonary disease, unspecified: Secondary | ICD-10-CM

## 2014-07-13 HISTORY — DX: Reserved for inherently not codable concepts without codable children: IMO0001

## 2014-07-13 HISTORY — DX: Dependence on supplemental oxygen: Z99.81

## 2014-07-13 HISTORY — DX: Interstitial pulmonary disease, unspecified: J84.9

## 2014-07-13 HISTORY — DX: Sleep apnea, unspecified: G47.30

## 2014-07-13 HISTORY — DX: Schizophrenia, unspecified: F20.9

## 2014-07-13 LAB — APTT: aPTT: 28 seconds (ref 24–37)

## 2014-07-13 LAB — URINALYSIS, ROUTINE W REFLEX MICROSCOPIC
Bilirubin Urine: NEGATIVE
Glucose, UA: NEGATIVE mg/dL
Hgb urine dipstick: NEGATIVE
Ketones, ur: NEGATIVE mg/dL
Nitrite: POSITIVE — AB
Protein, ur: NEGATIVE mg/dL
Specific Gravity, Urine: 1.016 (ref 1.005–1.030)
Urobilinogen, UA: 0.2 mg/dL (ref 0.0–1.0)
pH: 5 (ref 5.0–8.0)

## 2014-07-13 LAB — CBC
HCT: 43.7 % (ref 36.0–46.0)
Hemoglobin: 13.5 g/dL (ref 12.0–15.0)
MCH: 27.2 pg (ref 26.0–34.0)
MCHC: 30.9 g/dL (ref 30.0–36.0)
MCV: 88.1 fL (ref 78.0–100.0)
Platelets: 223 10*3/uL (ref 150–400)
RBC: 4.96 MIL/uL (ref 3.87–5.11)
RDW: 14.4 % (ref 11.5–15.5)
WBC: 10 10*3/uL (ref 4.0–10.5)

## 2014-07-13 LAB — URINE MICROSCOPIC-ADD ON

## 2014-07-13 LAB — SURGICAL PCR SCREEN
MRSA, PCR: NEGATIVE
Staphylococcus aureus: NEGATIVE

## 2014-07-13 LAB — TYPE AND SCREEN
ABO/RH(D): O POS
Antibody Screen: NEGATIVE

## 2014-07-13 LAB — PROTIME-INR
INR: 1.01 (ref 0.00–1.49)
Prothrombin Time: 13.4 seconds (ref 11.6–15.2)

## 2014-07-13 LAB — COMPREHENSIVE METABOLIC PANEL
ALT: 25 U/L (ref 0–35)
AST: 31 U/L (ref 0–37)
Albumin: 3.7 g/dL (ref 3.5–5.2)
Alkaline Phosphatase: 60 U/L (ref 39–117)
Anion gap: 5 (ref 5–15)
BUN: <5 mg/dL — ABNORMAL LOW (ref 6–23)
CO2: 30 mmol/L (ref 19–32)
Calcium: 9.1 mg/dL (ref 8.4–10.5)
Chloride: 102 mEq/L (ref 96–112)
Creatinine, Ser: 0.86 mg/dL (ref 0.50–1.10)
GFR calc Af Amer: >90 mL/min (ref >90)
GFR calc non Af Amer: 80 mL/min — ABNORMAL LOW (ref >90)
Glucose, Bld: 100 mg/dL — ABNORMAL HIGH (ref 70–99)
Potassium: 4.5 mmol/L (ref 3.5–5.1)
Sodium: 137 mmol/L (ref 135–145)
Total Bilirubin: 0.5 mg/dL (ref 0.3–1.2)
Total Protein: 7.7 g/dL (ref 6.0–8.3)

## 2014-07-13 LAB — HCG, SERUM, QUALITATIVE: PREG SERUM: NEGATIVE

## 2014-07-13 LAB — SEDIMENTATION RATE: Sed Rate: 3 mm/hr (ref 0–22)

## 2014-07-13 NOTE — Pre-Procedure Instructions (Signed)
FAREN FLORENCE  07/13/2014   Your procedure is scheduled on: Monday, July 16, 2014  Report to [facility] Admitting at 5:30 AM.  Call this number if you have problems the morning of surgery: [phone]   Remember:   Do not eat food or drink liquids after midnight Sunday, July 15, 2014   Take these medicines the morning of surgery with A SIP OF WATER:amLODipine (NORVASC),  ARIPiprazole (ABILIFY), gabapentin (NEURONTIN), buPROPion (WELLBUTRIN SR)  beclomethasone (QVAR) inhaler,  if needed:oxyCODONE for pain, albuterol (PROVENTIL HFA;VENTOLIN HFA) inhaler for wheezing or shortness of breath ( Bring inhaler with you on day of procedure)  Stop taking Aspirin, vitamins and herbal medications. Do not take any NSAIDs ie: Ibuprofen, Advil, Naproxen or any medication containing Aspirin.   Do not wear jewelry, make-up or nail polish.  Do not wear lotions, powders, or perfumes. You may not wear deodorant.  Do not shave 48 hours prior to surgery.   Do not bring valuables to the hospital.  [facility] is not responsible for any belongings or valuables.               Contacts, dentures or bridgework may not be worn into surgery.  Leave suitcase in the car. After surgery it may be brought to your room.  For patients admitted to the hospital, discharge time is determined by your treatment team.               Patients discharged the day of surgery will not be allowed to drive home.  Name and phone number of your driver:   Special Instructions:  Special Instructions:Special Instructions: [facility] - Preparing for Surgery  Before surgery, you can play an important role.  Because skin is not sterile, your skin needs to be as free of germs as possible.  You can reduce the number of germs on you skin by washing with CHG (chlorahexidine gluconate) soap before surgery.  CHG is an antiseptic cleaner which kills germs and bonds with the skin to continue killing germs even after  washing.  Please DO NOT use if you have an allergy to CHG or antibacterial soaps.  If your skin becomes reddened/irritated stop using the CHG and inform your nurse when you arrive at Short Stay.  Do not shave (including legs and underarms) for at least 48 hours prior to the first CHG shower.  You may shave your face.  Please follow these instructions carefully:   1.  Shower with CHG Soap the night before surgery and the morning of Surgery.  2.  If you choose to wash your hair, wash your hair first as usual with your normal shampoo.  3.  After you shampoo, rinse your hair and body thoroughly to remove the Shampoo.  4.  Use CHG as you would any other liquid soap.  You can apply chg directly  to the skin and wash gently with scrungie or a clean washcloth.  5.  Apply the CHG Soap to your body ONLY FROM THE NECK DOWN.  Do not use on open wounds or open sores.  Avoid contact with your eyes, ears, mouth and genitals (private parts).  Wash genitals (private parts) with your normal soap.  6.  Wash thoroughly, paying special attention to the area where your surgery will be performed.  7.  Thoroughly rinse your body with warm water from the neck down.  8.  DO NOT shower/wash with your normal soap after using and rinsing off the CHG Soap.  9.  Pat yourself dry with a clean towel.            10.  Wear clean pajamas.            11.  Place clean sheets on your bed the night of your first shower and do not sleep with pets.  Day of Surgery  Do not apply any lotions/deodorants the morning of surgery.  Please wear clean clothes to the hospital/surgery center.   Please read over the following fact sheets that you were given: Pain Booklet, Coughing and Deep Breathing, Blood Transfusion Information, MRSA Information and Surgical Site Infection Prevention

## 2014-07-13 NOTE — Progress Notes (Signed)
Anesthesia Chart Review:  Patient is a 49 year old female scheduled for video bronchoscopy, left VATS for lung biopsy on 07/16/14 by Dr. Servando Snare.   History includes smoking (previously 3ppd X > 25 years, but decreasing amount), ILD, home O2, HTN, Schizophrenia, ETOH abuse (reports now only occasional ETOH), anxiety, depression, headaches, arthritis, L3-4 PLIF 02/2013. BMI is consistent with morbid obesity. PCP is Dr. Nolene Ebbs.   Pulmonologist is Dr. Danton Sewer. 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 However, groundglass opacities and nodular densities can also be seen in hypersensitivity pneumonitis and sarcoidosis.Marland KitchenMarland KitchenI think she needs to have a thoracoscopic lung biopsy so that we can try and accurately identify her pulmonary process. If this is RB ILD, then the only appropriate treatment is smoking cessation. However, if another diagnosis is made, she may respond to other medications."  06/14/14 Echo: Left ventricle: The cavity size was normal. Systolic function wasnormal. The estimated ejection fraction was in the range of 60%to 65%. Wall motion was normal; there were no regional wall motion abnormalities.  07/13/14 EKG: NSR, possible anterior infarct (age undetermined). Overall, I think her EKG is stable when compared to 03/08/13 tracing.  07/13/14 CXR: Chronically increased pulmonary interstitial markings with mild hyperinflation. There is no acute pneumonia nor CHF.  04/10/14 PFTs: FVC 1.73 (53%), FEV1 1.48 (56%).  DLCO 12.38(45%).  Preoperative labs noted. ANA and anti-DNA antibody, double stranded tests are pending.  Patient with significant pulmonary history, but referred by pulmonology for this procedure.  EKG appears stable.  Recent echo unremarkable.  If no acute changes then I anticipate that she can proceed as  planned.  George Hugh [facility] Short Stay Center/Anesthesiology Phone [phone] 07/13/2014 4:07 PM

## 2014-07-15 MED ORDER — DEXTROSE 5 % IV SOLN
1.5000 g | INTRAVENOUS | Status: AC
  Administered 2014-07-16: 1.5 g via INTRAVENOUS
  Filled 2014-07-15: qty 1.5

## 2014-07-16 ENCOUNTER — Inpatient Hospital Stay (HOSPITAL_COMMUNITY): Payer: Medicaid Other | Admitting: Vascular Surgery

## 2014-07-16 ENCOUNTER — Encounter (HOSPITAL_COMMUNITY)
Admission: RE | Disposition: A | Discharge status: Home / Self Care | Payer: Self-pay | Source: Ambulatory Visit | Attending: Cardiothoracic Surgery

## 2014-07-16 ENCOUNTER — Inpatient Hospital Stay (HOSPITAL_COMMUNITY): Payer: Medicaid Other

## 2014-07-16 ENCOUNTER — Inpatient Hospital Stay (HOSPITAL_COMMUNITY)
Admission: RE | Admit: 2014-07-16 | Discharge: 2014-07-19 | DRG: 167 | Disposition: A | Discharge status: Home / Self Care | Payer: Medicaid Other | Source: Ambulatory Visit | Attending: Cardiothoracic Surgery | Admitting: Cardiothoracic Surgery

## 2014-07-16 ENCOUNTER — Inpatient Hospital Stay (HOSPITAL_COMMUNITY): Payer: Medicaid Other | Admitting: Anesthesiology

## 2014-07-16 ENCOUNTER — Encounter (HOSPITAL_COMMUNITY): Payer: Self-pay | Admitting: *Deleted

## 2014-07-16 DIAGNOSIS — J9610 Chronic respiratory failure, unspecified whether with hypoxia or hypercapnia: Secondary | ICD-10-CM | POA: Diagnosis present

## 2014-07-16 DIAGNOSIS — J939 Pneumothorax, unspecified: Secondary | ICD-10-CM

## 2014-07-16 DIAGNOSIS — F17210 Nicotine dependence, cigarettes, uncomplicated: Secondary | ICD-10-CM | POA: Diagnosis present

## 2014-07-16 DIAGNOSIS — Z6841 Body Mass Index (BMI) 40.0 and over, adult: Secondary | ICD-10-CM | POA: Diagnosis not present

## 2014-07-16 DIAGNOSIS — G4733 Obstructive sleep apnea (adult) (pediatric): Secondary | ICD-10-CM

## 2014-07-16 DIAGNOSIS — Z01812 Encounter for preprocedural laboratory examination: Secondary | ICD-10-CM | POA: Diagnosis not present

## 2014-07-16 DIAGNOSIS — Z01818 Encounter for other preprocedural examination: Secondary | ICD-10-CM | POA: Diagnosis not present

## 2014-07-16 DIAGNOSIS — J849 Interstitial pulmonary disease, unspecified: Secondary | ICD-10-CM | POA: Diagnosis not present

## 2014-07-16 DIAGNOSIS — J439 Emphysema, unspecified: Secondary | ICD-10-CM | POA: Diagnosis not present

## 2014-07-16 DIAGNOSIS — J84115 Respiratory bronchiolitis interstitial lung disease: Principal | ICD-10-CM | POA: Diagnosis present

## 2014-07-16 DIAGNOSIS — Z9889 Other specified postprocedural states: Secondary | ICD-10-CM

## 2014-07-16 DIAGNOSIS — J9601 Acute respiratory failure with hypoxia: Secondary | ICD-10-CM

## 2014-07-16 DIAGNOSIS — Z79899 Other long term (current) drug therapy: Secondary | ICD-10-CM

## 2014-07-16 DIAGNOSIS — Z01810 Encounter for preprocedural cardiovascular examination: Secondary | ICD-10-CM

## 2014-07-16 DIAGNOSIS — F209 Schizophrenia, unspecified: Secondary | ICD-10-CM | POA: Diagnosis present

## 2014-07-16 DIAGNOSIS — E119 Type 2 diabetes mellitus without complications: Secondary | ICD-10-CM | POA: Diagnosis present

## 2014-07-16 DIAGNOSIS — I10 Essential (primary) hypertension: Secondary | ICD-10-CM | POA: Diagnosis present

## 2014-07-16 DIAGNOSIS — R0602 Shortness of breath: Secondary | ICD-10-CM | POA: Diagnosis present

## 2014-07-16 HISTORY — PX: VIDEO ASSISTED THORACOSCOPY: SHX5073

## 2014-07-16 HISTORY — PX: LUNG BIOPSY: SHX5088

## 2014-07-16 HISTORY — PX: WEDGE RESECTION: SHX5070

## 2014-07-16 HISTORY — PX: VIDEO BRONCHOSCOPY: SHX5072

## 2014-07-16 LAB — BLOOD GAS, ARTERIAL
Acid-Base Excess: 2 mmol/L (ref 0.0–2.0)
Bicarbonate: 26.7 mEq/L — ABNORMAL HIGH (ref 20.0–24.0)
Drawn by: 41881
FIO2: 0.21 %
O2 Saturation: 84.6 %
Patient temperature: 98.6
TCO2: 28.2 mmol/L (ref 0–100)
pCO2 arterial: 47.3 mmHg — ABNORMAL HIGH (ref 35.0–45.0)
pH, Arterial: 7.371 (ref 7.350–7.450)
pO2, Arterial: 52.9 mmHg — ABNORMAL LOW (ref 80.0–100.0)

## 2014-07-16 LAB — ANTI-DNA ANTIBODY, DOUBLE-STRANDED: ds DNA Ab: <1 IU/mL

## 2014-07-16 LAB — ANA: Anti Nuclear Antibody(ANA): NEGATIVE

## 2014-07-16 LAB — GLUCOSE, CAPILLARY: Glucose-Capillary: 156 mg/dL — ABNORMAL HIGH (ref 70–99)

## 2014-07-16 SURGERY — BRONCHOSCOPY, VIDEO-ASSISTED
Anesthesia: General | Site: Chest

## 2014-07-16 MED ORDER — ONDANSETRON HCL 4 MG/2ML IJ SOLN
INTRAMUSCULAR | Status: DC | PRN
  Administered 2014-07-16: 4 mg via INTRAVENOUS

## 2014-07-16 MED ORDER — SODIUM CHLORIDE 0.9 % IJ SOLN: 9.0000 mL | INTRAMUSCULAR | Status: DC | PRN

## 2014-07-16 MED ORDER — FENTANYL CITRATE 0.05 MG/ML IJ SOLN
INTRAMUSCULAR | Status: AC
  Filled 2014-07-16: qty 5

## 2014-07-16 MED ORDER — PANTOPRAZOLE SODIUM 40 MG PO TBEC: 40.0000 mg | DELAYED_RELEASE_TABLET | Freq: Every day | ORAL | Status: DC

## 2014-07-16 MED ORDER — VECURONIUM BROMIDE 10 MG IV SOLR
INTRAVENOUS | Status: DC | PRN
  Administered 2014-07-16: 7 mg via INTRAVENOUS
  Administered 2014-07-16: 3 mg via INTRAVENOUS

## 2014-07-16 MED ORDER — ACETAMINOPHEN 160 MG/5ML PO SOLN
1000.0000 mg | Freq: Four times a day (QID) | ORAL | Status: DC
  Filled 2014-07-16: qty 40

## 2014-07-16 MED ORDER — ENOXAPARIN SODIUM 30 MG/0.3ML ~~LOC~~ SOLN
30.0000 mg | SUBCUTANEOUS | Status: DC
  Administered 2014-07-16 – 2014-07-18 (×3): 30 mg via SUBCUTANEOUS
  Filled 2014-07-16 (×4): qty 0.3

## 2014-07-16 MED ORDER — FLUTICASONE PROPIONATE HFA 44 MCG/ACT IN AERO
2.0000 | INHALATION_SPRAY | Freq: Two times a day (BID) | RESPIRATORY_TRACT | Status: DC
  Filled 2014-07-16: qty 10.6

## 2014-07-16 MED ORDER — FENTANYL CITRATE 0.05 MG/ML IJ SOLN
INTRAMUSCULAR | Status: DC | PRN
  Administered 2014-07-16 (×3): 100 ug via INTRAVENOUS
  Administered 2014-07-16 (×2): 50 ug via INTRAVENOUS
  Administered 2014-07-16: 100 ug via INTRAVENOUS

## 2014-07-16 MED ORDER — ACETAMINOPHEN 500 MG PO TABS
1000.0000 mg | ORAL_TABLET | Freq: Four times a day (QID) | ORAL | Status: DC
  Administered 2014-07-16 – 2014-07-19 (×11): 1000 mg via ORAL
  Filled 2014-07-16 (×15): qty 2

## 2014-07-16 MED ORDER — SENNOSIDES-DOCUSATE SODIUM 8.6-50 MG PO TABS
1.0000 | ORAL_TABLET | Freq: Every day | ORAL | Status: DC
  Administered 2014-07-16 – 2014-07-18 (×3): 1 via ORAL
  Filled 2014-07-16 (×4): qty 1

## 2014-07-16 MED ORDER — ONDANSETRON HCL 4 MG/2ML IJ SOLN: 4.0000 mg | Freq: Four times a day (QID) | INTRAMUSCULAR | Status: DC | PRN

## 2014-07-16 MED ORDER — POTASSIUM CHLORIDE 10 MEQ/50ML IV SOLN: 10.0000 meq | Freq: Every day | INTRAVENOUS | Status: DC | PRN

## 2014-07-16 MED ORDER — CETYLPYRIDINIUM CHLORIDE 0.05 % MT LIQD
7.0000 mL | Freq: Two times a day (BID) | OROMUCOSAL | Status: DC
  Administered 2014-07-16: 7 mL via OROMUCOSAL

## 2014-07-16 MED ORDER — PANTOPRAZOLE SODIUM 40 MG IV SOLR
40.0000 mg | INTRAVENOUS | Status: DC
  Administered 2014-07-16: 40 mg via INTRAVENOUS
  Filled 2014-07-16: qty 40

## 2014-07-16 MED ORDER — BISACODYL 5 MG PO TBEC
10.0000 mg | DELAYED_RELEASE_TABLET | Freq: Every day | ORAL | Status: DC
  Administered 2014-07-16 – 2014-07-18 (×3): 10 mg via ORAL
  Filled 2014-07-16 (×4): qty 2

## 2014-07-16 MED ORDER — DEXAMETHASONE SODIUM PHOSPHATE 10 MG/ML IJ SOLN
INTRAMUSCULAR | Status: DC | PRN
  Administered 2014-07-16: 10 mg via INTRAVENOUS

## 2014-07-16 MED ORDER — LEVALBUTEROL HCL 0.63 MG/3ML IN NEBU: 0.6300 mg | INHALATION_SOLUTION | Freq: Three times a day (TID) | RESPIRATORY_TRACT | Status: DC | PRN

## 2014-07-16 MED ORDER — QUETIAPINE FUMARATE 100 MG PO TABS
100.0000 mg | ORAL_TABLET | Freq: Every day | ORAL | Status: DC
  Filled 2014-07-16: qty 1

## 2014-07-16 MED ORDER — TRAMADOL HCL 50 MG PO TABS
50.0000 mg | ORAL_TABLET | Freq: Four times a day (QID) | ORAL | Status: DC | PRN
  Administered 2014-07-17: 100 mg via ORAL
  Filled 2014-07-16: qty 2

## 2014-07-16 MED ORDER — 0.9 % SODIUM CHLORIDE (POUR BTL) OPTIME
TOPICAL | Status: DC | PRN
  Administered 2014-07-16: 1000 mL

## 2014-07-16 MED ORDER — LEVALBUTEROL HCL 0.63 MG/3ML IN NEBU: 0.6300 mg | INHALATION_SOLUTION | Freq: Four times a day (QID) | RESPIRATORY_TRACT | Status: DC

## 2014-07-16 MED ORDER — GABAPENTIN 100 MG PO CAPS: 100.0000 mg | ORAL_CAPSULE | Freq: Four times a day (QID) | ORAL | Status: DC

## 2014-07-16 MED ORDER — GABAPENTIN 400 MG PO CAPS
400.0000 mg | ORAL_CAPSULE | Freq: Every day | ORAL | Status: DC
  Administered 2014-07-16 – 2014-07-18 (×3): 400 mg via ORAL
  Filled 2014-07-16 (×4): qty 1

## 2014-07-16 MED ORDER — ARIPIPRAZOLE 15 MG PO TABS
25.0000 mg | ORAL_TABLET | Freq: Every day | ORAL | Status: DC
  Administered 2014-07-16 – 2014-07-19 (×4): 25 mg via ORAL
  Filled 2014-07-16 (×4): qty 1

## 2014-07-16 MED ORDER — FENTANYL 10 MCG/ML IV SOLN
INTRAVENOUS | Status: DC
  Administered 2014-07-16: 11:00:00 via INTRAVENOUS
  Administered 2014-07-16: 75 ug via INTRAVENOUS
  Administered 2014-07-17: 60 ug via INTRAVENOUS
  Administered 2014-07-17: 90 ug via INTRAVENOUS
  Administered 2014-07-17: 10 ug/h via INTRAVENOUS
  Administered 2014-07-17: 45 ug via INTRAVENOUS
  Administered 2014-07-17: 105 ug via INTRAVENOUS
  Administered 2014-07-18 (×2): 15 ug via INTRAVENOUS
  Administered 2014-07-18: 45 ug via INTRAVENOUS
  Administered 2014-07-18: 30 ug via INTRAVENOUS
  Administered 2014-07-18: 15 ug via INTRAVENOUS
  Administered 2014-07-18: 60 ug via INTRAVENOUS
  Administered 2014-07-19: 45 ug via INTRAVENOUS
  Administered 2014-07-19: 30 ug via INTRAVENOUS
  Filled 2014-07-16 (×2): qty 50

## 2014-07-16 MED ORDER — INSULIN ASPART 100 UNIT/ML ~~LOC~~ SOLN
0.0000 [IU] | Freq: Four times a day (QID) | SUBCUTANEOUS | Status: DC
  Administered 2014-07-16 – 2014-07-18 (×7): 2 [IU] via SUBCUTANEOUS
  Administered 2014-07-19: 4 [IU] via SUBCUTANEOUS
  Administered 2014-07-19: 2 [IU] via SUBCUTANEOUS

## 2014-07-16 MED ORDER — DIPHENHYDRAMINE HCL 50 MG/ML IJ SOLN: 12.5000 mg | Freq: Four times a day (QID) | INTRAMUSCULAR | Status: DC | PRN

## 2014-07-16 MED ORDER — ROCURONIUM BROMIDE 100 MG/10ML IV SOLN
INTRAVENOUS | Status: DC | PRN
  Administered 2014-07-16: 50 mg via INTRAVENOUS

## 2014-07-16 MED ORDER — MIDAZOLAM HCL 5 MG/5ML IJ SOLN
INTRAMUSCULAR | Status: DC | PRN
  Administered 2014-07-16: 2 mg via INTRAVENOUS
  Administered 2014-07-16: 1 mg via INTRAVENOUS

## 2014-07-16 MED ORDER — HYDROMORPHONE HCL 1 MG/ML IJ SOLN
0.2500 mg | INTRAMUSCULAR | Status: DC | PRN
  Administered 2014-07-16 (×2): 0.5 mg via INTRAVENOUS

## 2014-07-16 MED ORDER — PROPOFOL 10 MG/ML IV BOLUS
INTRAVENOUS | Status: AC
  Filled 2014-07-16: qty 20

## 2014-07-16 MED ORDER — AMLODIPINE BESYLATE 5 MG PO TABS
5.0000 mg | ORAL_TABLET | Freq: Every day | ORAL | Status: DC
  Administered 2014-07-17 – 2014-07-19 (×3): 5 mg via ORAL
  Filled 2014-07-16 (×3): qty 1

## 2014-07-16 MED ORDER — LACTATED RINGERS IV SOLN
INTRAVENOUS | Status: DC | PRN
  Administered 2014-07-16: 07:00:00 via INTRAVENOUS

## 2014-07-16 MED ORDER — MEPERIDINE HCL 25 MG/ML IJ SOLN: 6.2500 mg | INTRAMUSCULAR | Status: DC | PRN

## 2014-07-16 MED ORDER — TRAZODONE HCL 150 MG PO TABS
150.0000 mg | ORAL_TABLET | Freq: Every day | ORAL | Status: DC
  Filled 2014-07-16: qty 1

## 2014-07-16 MED ORDER — GLYCOPYRROLATE 0.2 MG/ML IJ SOLN
INTRAMUSCULAR | Status: DC | PRN
  Administered 2014-07-16: 0.2 mg via INTRAVENOUS
  Administered 2014-07-16: 1 mg via INTRAVENOUS

## 2014-07-16 MED ORDER — HYDRALAZINE HCL 20 MG/ML IJ SOLN: 10.0000 mg | INTRAMUSCULAR | Status: DC | PRN

## 2014-07-16 MED ORDER — SODIUM CHLORIDE 0.9 % IV SOLN
10.0000 mg | INTRAVENOUS | Status: DC | PRN
  Administered 2014-07-16: 25 ug/min via INTRAVENOUS

## 2014-07-16 MED ORDER — OXYCODONE HCL 5 MG PO TABS: 5.0000 mg | ORAL_TABLET | ORAL | Status: DC | PRN

## 2014-07-16 MED ORDER — DIPHENHYDRAMINE HCL 12.5 MG/5ML PO ELIX
12.5000 mg | ORAL_SOLUTION | Freq: Four times a day (QID) | ORAL | Status: DC | PRN
  Filled 2014-07-16: qty 5

## 2014-07-16 MED ORDER — GABAPENTIN 100 MG PO CAPS
100.0000 mg | ORAL_CAPSULE | Freq: Three times a day (TID) | ORAL | Status: DC
  Administered 2014-07-16 – 2014-07-19 (×8): 100 mg via ORAL
  Filled 2014-07-16 (×11): qty 1

## 2014-07-16 MED ORDER — PROPOFOL 10 MG/ML IV BOLUS
INTRAVENOUS | Status: DC | PRN
  Administered 2014-07-16: 150 mg via INTRAVENOUS

## 2014-07-16 MED ORDER — CLOTRIMAZOLE 1 % EX CREA
1.0000 | TOPICAL_CREAM | Freq: Two times a day (BID) | CUTANEOUS | Status: DC | PRN
Start: 2014-07-16 — End: 2014-07-19

## 2014-07-16 MED ORDER — HYDROMORPHONE HCL 1 MG/ML IJ SOLN
INTRAMUSCULAR | Status: AC
  Filled 2014-07-16: qty 2

## 2014-07-16 MED ORDER — NALOXONE HCL 0.4 MG/ML IJ SOLN: 0.4000 mg | INTRAMUSCULAR | Status: DC | PRN

## 2014-07-16 MED ORDER — NEOSTIGMINE METHYLSULFATE 10 MG/10ML IV SOLN
INTRAVENOUS | Status: DC | PRN
  Administered 2014-07-16: 5 mg via INTRAVENOUS

## 2014-07-16 MED ORDER — FUROSEMIDE 20 MG PO TABS
20.0000 mg | ORAL_TABLET | Freq: Every day | ORAL | Status: DC
  Administered 2014-07-17 – 2014-07-19 (×3): 20 mg via ORAL
  Filled 2014-07-16 (×3): qty 1

## 2014-07-16 MED ORDER — MIDAZOLAM HCL 2 MG/2ML IJ SOLN
INTRAMUSCULAR | Status: AC
  Filled 2014-07-16: qty 2

## 2014-07-16 MED ORDER — BUPROPION HCL ER (SR) 150 MG PO TB12
150.0000 mg | ORAL_TABLET | Freq: Two times a day (BID) | ORAL | Status: DC
  Administered 2014-07-16 – 2014-07-19 (×7): 150 mg via ORAL
  Filled 2014-07-16 (×8): qty 1

## 2014-07-16 MED ORDER — DEXTROSE-NACL 5-0.9 % IV SOLN
INTRAVENOUS | Status: DC
  Administered 2014-07-16: 100 mL/h via INTRAVENOUS
  Administered 2014-07-17: via INTRAVENOUS

## 2014-07-16 MED ORDER — PROMETHAZINE HCL 25 MG/ML IJ SOLN: 6.2500 mg | INTRAMUSCULAR | Status: DC | PRN

## 2014-07-16 MED ORDER — DEXTROSE 5 % IV SOLN
1.5000 g | Freq: Two times a day (BID) | INTRAVENOUS | Status: AC
  Administered 2014-07-16 – 2014-07-17 (×2): 1.5 g via INTRAVENOUS
  Filled 2014-07-16 (×2): qty 1.5

## 2014-07-16 SURGICAL SUPPLY — 75 items
APPLICATOR TIP COSEAL (VASCULAR PRODUCTS) IMPLANT
APPLICATOR TIP EXT COSEAL (VASCULAR PRODUCTS) IMPLANT
BLADE SURG 11 STRL SS (BLADE) IMPLANT
BRUSH CYTOL CELLEBRITY 1.5X140 (MISCELLANEOUS) IMPLANT
CANISTER SUCTION 2500CC (MISCELLANEOUS) ×4 IMPLANT
CATH KIT ON Q 5IN SLV (PAIN MANAGEMENT) IMPLANT
CATH THORACIC 28FR (CATHETERS) IMPLANT
CATH THORACIC 36FR (CATHETERS) IMPLANT
CATH THORACIC 36FR RT ANG (CATHETERS) IMPLANT
CLEANER TIP ELECTROSURG 2X2 (MISCELLANEOUS) ×4 IMPLANT
CLIP TI MEDIUM 6 (CLIP) IMPLANT
CONT SPEC 4OZ CLIKSEAL STRL BL (MISCELLANEOUS) ×8 IMPLANT
COVER TABLE BACK 60X90 (DRAPES) ×4 IMPLANT
DERMABOND ADVANCED (GAUZE/BANDAGES/DRESSINGS) ×1
DERMABOND ADVANCED .7 DNX12 (GAUZE/BANDAGES/DRESSINGS) ×3 IMPLANT
DRAPE LAPAROSCOPIC ABDOMINAL (DRAPES) ×4 IMPLANT
DRAPE WARM FLUID 44X44 (DRAPE) ×4 IMPLANT
DRILL BIT 7/64X5 (BIT) IMPLANT
ELECT BLADE 4.0 EZ CLEAN MEGAD (MISCELLANEOUS) ×4
ELECT REM PT RETURN 9FT ADLT (ELECTROSURGICAL) ×4
ELECTRODE BLDE 4.0 EZ CLN MEGD (MISCELLANEOUS) ×3 IMPLANT
ELECTRODE REM PT RTRN 9FT ADLT (ELECTROSURGICAL) ×3 IMPLANT
FORCEPS BIOP RJ4 1.8 (CUTTING FORCEPS) IMPLANT
GAUZE SPONGE 4X4 12PLY STRL (GAUZE/BANDAGES/DRESSINGS) ×4 IMPLANT
GLOVE BIO SURGEON STRL SZ 6.5 (GLOVE) ×8 IMPLANT
GOWN STRL REUS W/ TWL LRG LVL3 (GOWN DISPOSABLE) ×9 IMPLANT
GOWN STRL REUS W/TWL LRG LVL3 (GOWN DISPOSABLE) ×3
HANDLE STAPLE ENDO GIA SHORT (STAPLE) ×1
KIT BASIN OR (CUSTOM PROCEDURE TRAY) ×4 IMPLANT
KIT CLEAN ENDO COMPLIANCE (KITS) ×4 IMPLANT
KIT ROOM TURNOVER OR (KITS) ×4 IMPLANT
KIT SUCTION CATH 14FR (SUCTIONS) ×4 IMPLANT
MARKER SKIN DUAL TIP RULER LAB (MISCELLANEOUS) ×4 IMPLANT
NEEDLE BIOPSY TRANSBRONCH 21G (NEEDLE) IMPLANT
NS IRRIG 1000ML POUR BTL (IV SOLUTION) ×8 IMPLANT
OIL SILICONE PENTAX (PARTS (SERVICE/REPAIRS)) ×4 IMPLANT
PACK CHEST (CUSTOM PROCEDURE TRAY) ×4 IMPLANT
PAD ARMBOARD 7.5X6 YLW CONV (MISCELLANEOUS) ×8 IMPLANT
PASSER SUT SWANSON 36MM LOOP (INSTRUMENTS) IMPLANT
RELOAD EGIA 45 MED/THCK PURPLE (STAPLE) ×8 IMPLANT
SCISSORS ENDO CVD 5DCS (MISCELLANEOUS) ×4 IMPLANT
SEALANT PROGEL (MISCELLANEOUS) IMPLANT
SEALANT SURG COSEAL 4ML (VASCULAR PRODUCTS) IMPLANT
SEALANT SURG COSEAL 8ML (VASCULAR PRODUCTS) IMPLANT
SOLUTION ANTI FOG 6CC (MISCELLANEOUS) ×4 IMPLANT
SPONGE GAUZE 4X4 12PLY STER LF (GAUZE/BANDAGES/DRESSINGS) ×4 IMPLANT
STAPLER ENDO GIA 12MM SHORT (STAPLE) ×3 IMPLANT
SUT PROLENE 3 0 SH DA (SUTURE) IMPLANT
SUT PROLENE 4 0 RB 1 (SUTURE)
SUT PROLENE 4-0 RB1 .5 CRCL 36 (SUTURE) IMPLANT
SUT SILK  1 MH (SUTURE) ×2
SUT SILK 1 MH (SUTURE) ×6 IMPLANT
SUT SILK 2 0SH CR/8 30 (SUTURE) IMPLANT
SUT SILK 3 0SH CR/8 30 (SUTURE) IMPLANT
SUT VIC AB 1 CTX 18 (SUTURE) IMPLANT
SUT VIC AB 1 CTX 36 (SUTURE)
SUT VIC AB 1 CTX36XBRD ANBCTR (SUTURE) IMPLANT
SUT VIC AB 2-0 CTX 36 (SUTURE) IMPLANT
SUT VIC AB 2-0 UR6 27 (SUTURE) ×8 IMPLANT
SUT VIC AB 3-0 X1 27 (SUTURE) ×8 IMPLANT
SUT VICRYL 2 TP 1 (SUTURE) IMPLANT
SWAB COLLECTION DEVICE MRSA (MISCELLANEOUS) IMPLANT
SYR 20ML ECCENTRIC (SYRINGE) ×4 IMPLANT
SYSTEM SAHARA CHEST DRAIN ATS (WOUND CARE) ×4 IMPLANT
TAPE CLOTH 4X10 WHT NS (GAUZE/BANDAGES/DRESSINGS) ×4 IMPLANT
TAPE CLOTH SURG 4X10 WHT LF (GAUZE/BANDAGES/DRESSINGS) ×4 IMPLANT
TIP APPLICATOR SPRAY EXTEND 16 (VASCULAR PRODUCTS) IMPLANT
TOWEL OR 17X24 6PK STRL BLUE (TOWEL DISPOSABLE) ×4 IMPLANT
TOWEL OR 17X26 10 PK STRL BLUE (TOWEL DISPOSABLE) ×8 IMPLANT
TRAP SPECIMEN MUCOUS 40CC (MISCELLANEOUS) ×8 IMPLANT
TRAY FOLEY CATH 14FRSI W/METER (CATHETERS) ×4 IMPLANT
TROCAR XCEL BLUNT TIP 100MML (ENDOMECHANICALS) ×4 IMPLANT
TUBE ANAEROBIC SPECIMEN COL (MISCELLANEOUS) IMPLANT
TUBE CONNECTING 20X1/4 (TUBING) ×8 IMPLANT
WATER STERILE IRR 1000ML POUR (IV SOLUTION) ×8 IMPLANT

## 2014-07-16 NOTE — Brief Op Note (Addendum)
[address]       [place] [address]             [phone]    07/16/2014  10:13 AM  PATIENT:  Diane Gilbert  49 y.o. female  PRE-OPERATIVE DIAGNOSIS:  ILD  POST-OPERATIVE DIAGNOSIS: ILD  PROCEDURE:  VIDEO BRONCHOSCOPY  LEFT VIDEO ASSISTED THORACOSCOPY, WEDGE RESECTION ofLEFT UPPER AND LOWER LUNG   SURGEON:  Surgeon(s) and Role:    * Grace Isaac, MD - Primary  PHYSICIAN ASSISTANT: Lars Pinks PA-C  ANESTHESIA:   general  EBL:  Total I/O In: 1500 [I.V.:1500] Out: 400 [Urine:100; Other:300]  BLOOD ADMINISTERED:none  DRAINS: McKenzie placed in the left pleural space   SPECIMEN:  Source of Specimen:  Wedge of LUL and LLL  DISPOSITION OF SPECIMEN:  Pathology and culture  COUNTS CORRECT:  YES  DICTATION: .Dragon Dictation  PLAN OF CARE: Admit to inpatient   PATIENT DISPOSITION:  PACU. Stable.   Delay start of Pharmacological VTE agent (>24hrs) due to surgical blood loss or risk of bleeding: yes

## 2014-07-16 NOTE — Progress Notes (Signed)
Decreased Venturi Mask to 6L/35%. RT will continue to monitor and wean as tolerated. RN aware.

## 2014-07-16 NOTE — Progress Notes (Signed)
Pt off bipap at this time. Pt remains on Venturi Mask. Decreased to 10L/45%. RT will continue to monitor.

## 2014-07-16 NOTE — Anesthesia Procedure Notes (Addendum)
Procedure Name: Intubation Date/Time: 07/16/2014 7:36 AM Performed by: Neldon Newport Pre-anesthesia Checklist: Patient being monitored, Suction available, Emergency Drugs available, Patient identified and Timeout performed Patient Re-evaluated:Patient Re-evaluated prior to inductionOxygen Delivery Method: Circle system utilized Preoxygenation: Pre-oxygenation with 100% oxygen Intubation Type: IV induction Ventilation: Mask ventilation without difficulty, Oral airway inserted - appropriate to patient size and Two handed mask ventilation required Laryngoscope Size: Mac and 3 Grade View: Grade II Tube type: Oral Tube size: 8.5 mm Number of attempts: 1 Placement Confirmation: positive ETCO2,  ETT inserted through vocal cords under direct vision and breath sounds checked- equal and bilateral Secured at: 22 cm Tube secured with: Tape Dental Injury: Teeth and Oropharynx as per pre-operative assessment     Procedure Name: Intubation Date/Time: 07/16/2014 8:05 AM Performed by: Neldon Newport Pre-anesthesia Checklist: Patient being monitored, Suction available, Emergency Drugs available, Patient identified and Timeout performed Oxygen Delivery Method: Circle system utilized Laryngoscope Size: Glidescope and 3 Endobronchial tube: Left, Double lumen EBT, EBT position confirmed by fiberoptic bronchoscope and EBT position confirmed by auscultation and 35 Fr Number of attempts: 5 or more Airway Equipment and Method: Video-laryngoscopy and Bougie stylet Placement Confirmation: positive ETCO2,  breath sounds checked- equal and bilateral and ETT inserted through vocal cords under direct vision Tube secured with: Tape Dental Injury: Bloody posterior oropharynx  Difficulty Due To: Difficulty was anticipated, Difficult Airway- due to large tongue and Difficult Airway- due to reduced neck mobility Future Recommendations: Recommend- induction with short-acting agent, and alternative techniques  readily available Comments: Attempt to exchange double lumen ETT over exchange catheter with 37 fr tube unsuccessful. Attempted with 35 fr tube without success. DL with airway exchange catheter also unsuccessful. Airway catheter removed and pt masked. Glide scope with grade II view. Extreme redundancy of pharyngeal/hypopharygeal tissue. Unable to pass 35 fr tube through cords. Fiberoptic scope passed through DL tube, through glottis and ETT successfully passed through glottis over fiberoptic scope. Bronchial lumen inadvertently passed into right bronchus. ETT withdrawn and passed bronchial lumen into left mainstem. RUL bronchus visualized through tracheal lumen. Tube secured.

## 2014-07-16 NOTE — Consult Note (Signed)
PULMONARY / CRITICAL CARE MEDICINE   Name: Diane Gilbert MRN: [id number] DOB: [birth year]    ADMISSION DATE:  07/16/2014 CONSULTATION DATE:  07/16/2014  REFERRING MD :  Dr. Servando Snare  CHIEF COMPLAINT:  S/p VATS  INITIAL PRESENTATION: 49 year old female, smoker, with increasing dyspnea and CT changes. Office pt of [place]. Presented 1/18 for FOB, VATs, and underwent wedge resection of L upper and lower lung. Out to ICU post operatively.   STUDIES:  01/22/14 High res CT chest > Pulmonary parenchymal pattern of ground-glass and centrilobular nodularity can be seen with the spectrum of respiratory bronchiolitis interstitial lung disease (RB-ILD) in this patient who is currently an active smoker. Subacute hypersensitivity pneumonitis is not excluded.  SIGNIFICANT EVENTS: 1/18 underwent L VATs, with wedge resection of L upper and lower lung. To ICU post op with L chest tube.   HISTORY OF PRESENT ILLNESS:  49 year old female, active smoker. 3ppd for 25 years, now down to several cigarettes per day. 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 Her dyspnea has been progressively worsening for some time. Most recently she was seen in the pulmonary office 06/07/14. Her O2 sats were in the high 80's on RA and she was started on supplemental continuous O2 in addition to her scheduled QVAR and PRN albuterol. 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 However,She had eosinophils in BAL, so if there was another cause and more another potential treatment, he felt this should be explored and referred her to [facility] for thoracoscopic evaluation. She presented 1/18 and underwent L VATs, with wedge resection of L upper and lower lung. She was sent to ICU after the procedure. Breathing spontaneously, but with L chest tube in place.   PAST MEDICAL HISTORY :   has a past medical history of  Alcohol abuse; Hypertension; Insomnia; Anxiety; Depression; Headache(784.0); Arthritis; Interstitial lung disease; Oxygen dependent; Sleep apnea; Shortness of breath dyspnea; and Schizophrenia.  has past surgical history that includes Back surgery; Video bronchoscopy (Bilateral, 04/11/2014); and Multiple tooth extractions. Prior to Admission medications   Medication Sig Start Date End Date Taking? Authorizing Provider  albuterol (PROVENTIL HFA;VENTOLIN HFA) 108 (90 BASE) MCG/ACT inhaler Inhale 2 puffs into the lungs every 6 (six) hours as needed for wheezing or shortness of breath. 04/05/14  Yes Kathee Delton, MD  amLODipine (NORVASC) 5 MG tablet Take 5 mg by mouth daily. 05/21/14  Yes Historical Provider, MD  ARIPiprazole (ABILIFY) 5 MG tablet Take 25 mg by mouth daily.    Yes Historical Provider, MD  beclomethasone (QVAR) 80 MCG/ACT inhaler Inhale 2 puffs into the lungs 2 (two) times daily.   Yes Historical Provider, MD  buPROPion (WELLBUTRIN SR) 150 MG 12 hr tablet Take 150 mg by mouth 2 (two) times daily.   Yes Historical Provider, MD  clotrimazole (LOTRIMIN) 1 % cream Apply 1 application topically 2 (two) times daily.   Yes Historical Provider, MD  furosemide (LASIX) 20 MG tablet Take 20 mg by mouth daily.  06/19/14  Yes Historical Provider, MD  gabapentin (NEURONTIN) 100 MG capsule Take 100-400 mg by mouth 4 (four) times daily. Takes 100 mg 3 times daily and 400 mg at bedtime   Yes Historical Provider, MD  oxyCODONE-acetaminophen (PERCOCET/ROXICET) 5-325 MG per tablet Take 1 tablet by mouth daily as needed for moderate pain. As needed 05/21/14  Yes Historical Provider, MD  QUEtiapine (  SEROQUEL) 100 MG tablet Take 100 mg by mouth at bedtime.   Yes Historical Provider, MD  tiZANidine (ZANAFLEX) 4 MG tablet Take 4 mg by mouth 2 (two) times daily as needed for muscle spasms.   Yes Historical Provider, MD  traZODone (DESYREL) 150 MG tablet Take 150 mg by mouth at bedtime.   Yes Historical Provider,  MD   No Known Allergies  FAMILY HISTORY:  indicated that her mother is alive. She indicated that her father is alive.  SOCIAL HISTORY:  reports that she has been smoking Cigarettes.  She has been smoking about 0.00 packs per day for the past 30 years. She has never used smokeless tobacco. She reports that she drinks alcohol. She reports that she does not use illicit drugs.  REVIEW OF SYSTEMS:  Unable due to lethargy  SUBJECTIVE:   VITAL SIGNS: Temp:  [97.5 F (36.4 C)-99.2 F (37.3 C)] 97.5 F (36.4 C) (01/18 1145) Pulse Rate:  [95-105] 100 (01/18 1145) Resp:  [20-26] 20 (01/18 1145) BP: (117-148)/(56-75) 145/60 mmHg (01/18 1145) SpO2:  [88 %-98 %] 90 % (01/18 1145) Arterial Line BP: (144-175)/(58-67) 151/59 mmHg (01/18 1145) Weight:  [108.863 kg (240 lb)] 108.863 kg (240 lb) (01/18 0603) HEMODYNAMICS:   VENTILATOR SETTINGS:   INTAKE / OUTPUT:  Intake/Output Summary (Last 24 hours) at 07/16/14 1159 Last data filed at 07/16/14 0954  Gross per 24 hour  Intake   1500 ml  Output    400 ml  Net   1100 ml    PHYSICAL EXAMINATION: General:  Morbidly obese female  Neuro:  Lethargic, arouses to verbal stimuli HEENT:  NCAT, large neck girth Cardiovascular:  RRR, no MRG Lungs:  Scattered rales Abdomen:  Obese, soft, non-tender Musculoskeletal:  No acute deformity Skin:  Grossly intact  LABS:  CBC  Recent Labs Lab 07/13/14 1122  WBC 10.0  HGB 13.5  HCT 43.7  PLT 223   Coag's  Recent Labs Lab 07/13/14 1122  APTT 28  INR 1.01   BMET  Recent Labs Lab 07/13/14 1122  NA 137  K 4.5  CL 102  CO2 30  BUN <5*  CREATININE 0.86  GLUCOSE 100*   Electrolytes  Recent Labs Lab 07/13/14 1122  CALCIUM 9.1   Sepsis Markers No results for input(s): LATICACIDVEN, PROCALCITON, O2SATVEN in the last 168 hours. ABG  Recent Labs Lab 07/16/14 0605  PHART 7.371  PCO2ART 47.3*  PO2ART 52.9*   Liver Enzymes  Recent Labs Lab 07/13/14 1122  AST 31  ALT 25   ALKPHOS 60  BILITOT 0.5  ALBUMIN 3.7   ANA: negative Hypersensitivity panel: negative  Cardiac Enzymes No results for input(s): TROPONINI, PROBNP in the last 168 hours. Glucose No results for input(s): GLUCAP in the last 168 hours.  Imaging No results found.   ASSESSMENT / PLAN:  PULMONARY A: Acute on chronic hypoxemic respiratory failure ? Respiratory bronchiolitis interstitial lung disease  >> also concern for hypersensitivity pneumonitis and sarcoidosis OSA Left CT in place  P:   PRN BiPAP Maintain SpO2 > 90% PRN bronchodilators CT to suction, management per TCTS Follow CXR  CARDIOVASCULAR CVL RIJ 1/18 > A:  HTN  P:  Telemetry Hold home antihypertensives while on BiPAP  PRN hydralazine to maintain SBP < 160 mm/Hg  RENAL A:   No acute issues  P:   Follow Bmet  GASTROINTESTINAL A:   H/o Alcohol abuse, LFT normal  P:   SUP: IV pantoprazole  HEMATOLOGIC A:   No acute  issues  P:  Follow CBC  INFECTIOUS A:   ? Eosinophilic PNA   P:   Await tissue sampling from VATs Monitor clinically  ENDOCRINE A:   No acute issues   P:   Monitor glucose on chemistry  NEUROLOGIC A:   Lethargy, likely due to intraoperative sedation/anethstesia   P:   RASS goal: 0 Monitor On fentanyl PCA Hold sedating medications for now   Georgann Housekeeper, AGACNP-BC [facility] Pulmonology/Critical Care Pager [phone] or [phone]   Reviewed above, examined.  49 yo female with dyspnea found to have GGO with nodularity on CT chest and PFTs showing restriction and diffusion defect.  She has hx of smoking.  She has been tried on prednisone as outpt w/o improvement.  She also has hx of HTN, and mild OSA.  She had VATS bx earlier today and extubated in PACU.  She is somnolent, and borderline O2 sats.  Chest has b/l rales.  She will need close monitoring in ICU >> will use BiPAP and supplemental oxygen.  Try to limit narcotics/sedatives for now.  F/u Bx results.   Defer further steroids for now.  Chesley Mires, MD [facility] Pulmonary/Critical Care 07/16/2014, 12:37 PM Pager:  [phone] After 3pm call: [phone]

## 2014-07-16 NOTE — Progress Notes (Signed)
Utilization Review Completed.  

## 2014-07-16 NOTE — Progress Notes (Signed)
Pt placed on 4L [place]. SPO2 97%. Denies SOB. RN aware

## 2014-07-16 NOTE — Progress Notes (Signed)
S/p lung biopsy  Up in chair  BP 154/82 mmHg  Pulse 101  Temp(Src) 97.5 F (36.4 C) (Oral)  Resp 24  Ht _0  (1.626 m)  Wt 240 lb (108.863 kg)  BMI 41.18 kg/m2  SpO2 94%  LMP 07/02/2014   Intake/Output Summary (Last 24 hours) at 07/16/14 1659 Last data filed at 07/16/14 1600  Gross per 24 hour  Intake 1786.67 ml  Output   1255 ml  Net 531.67 ml    94% sat on 55% VM  Appreciate Pulmonary's assistance

## 2014-07-16 NOTE — Anesthesia Preprocedure Evaluation (Addendum)
Anesthesia Evaluation  Patient identified by MRN, date of birth, ID band Patient awake    Reviewed: Allergy & Precautions, NPO status , Patient's Chart, lab work & pertinent test results  Airway Mallampati: II  TM Distance: >3 FB Neck ROM: Full    Dental no notable dental hx. (+) Dental Advidsory Given, Poor Dentition   Pulmonary shortness of breath, at rest and Long-Term Oxygen Therapy, sleep apnea , pneumonia -, Current Smoker,    + decreased breath sounds      Cardiovascular hypertension, Pt. on medications Rhythm:Regular Rate:Normal     Neuro/Psych  Headaches, PSYCHIATRIC DISORDERS negative neurological ROS  negative psych ROS   GI/Hepatic negative GI ROS, Neg liver ROS,   Endo/Other  Morbid obesity  Renal/GU Renal disease     Musculoskeletal  (+) Arthritis -,   Abdominal   Peds  Hematology negative hematology ROS (+) anemia ,   Anesthesia Other Findings - Left ventricle: The cavity size was normal. Systolic function wasnormal. The estimated ejection fraction was in the range of 60%to 65%. Wall motion was normal; there were no regional wallmotion abnormalities.   Reproductive/Obstetrics negative OB ROS                          Anesthesia Physical Anesthesia Plan  ASA: III  Anesthesia Plan: General   Post-op Pain Management:    Induction: Intravenous  Airway Management Planned: Double Lumen EBT  Additional Equipment: None  Intra-op Plan:   Post-operative Plan: Extubation in OR  Informed Consent: I have reviewed the patients History and Physical, chart, labs and discussed the procedure including the risks, benefits and alternatives for the proposed anesthesia with the patient or authorized representative who has indicated his/her understanding and acceptance.   Dental advisory given  Plan Discussed with: CRNA  Anesthesia Plan Comments:        Anesthesia Quick  Evaluation                                  Anesthesia Evaluation  Patient identified by MRN, date of birth, ID band Patient awake    Reviewed: Allergy & Precautions, H&P , NPO status , Patient's Chart, lab work & pertinent test results  Airway Mallampati: III TM Distance: >3 FB Neck ROM: Full    Dental no notable dental hx. (+) Teeth Intact and Dental Advisory Given   Pulmonary Current Smoker,  breath sounds clear to auscultation  Pulmonary exam normal       Cardiovascular hypertension, On Medications Rhythm:Regular Rate:Normal     Neuro/Psych  Headaches, PSYCHIATRIC DISORDERS Anxiety Depression    GI/Hepatic negative GI ROS, (+)     substance abuse  alcohol use,   Endo/Other  Morbid obesity  Renal/GU negative Renal ROS  negative genitourinary   Musculoskeletal   Abdominal   Peds  Hematology negative hematology ROS (+)   Anesthesia Other Findings   Reproductive/Obstetrics negative OB ROS                          Anesthesia Physical Anesthesia Plan  ASA: III  Anesthesia Plan: General   Post-op Pain Management:    Induction: Intravenous  Airway Management Planned: Oral ETT  Additional Equipment:   Intra-op Plan:   Post-operative Plan: Extubation in OR  Informed Consent: I have reviewed the patients History and Physical, chart, labs and discussed the procedure  including the risks, benefits and alternatives for the proposed anesthesia with the patient or authorized representative who has indicated his/her understanding and acceptance.   Dental advisory given  Plan Discussed with: CRNA  Anesthesia Plan Comments:         Anesthesia Quick Evaluation

## 2014-07-16 NOTE — H&P (Signed)
[address]       [place] [address]             [phone]                    [place] [facility] [place] Medical Record #[id number] Date of Birth: 1965-08-09  Referring:Dr Clance Primary Care: Philis Fendt, MD  Chief Complaint:    SOB   History of Present Illness:    Diane Gilbert 49 y.o. female is seen in the office at the request of Dr. Gwenette Greet to consider thorascopic lung biopsy. The patient has had progressive worsening of her respiratory status, to the point where last week she was started on oxygen. Previous bronchoscopy and BAL have been unrevealing, the patient was started on steroids without much response. Unfortunately she's been a smoker and at times up to 3 packs a day for more than 25 years. She's now down to several cigarettes a day.  She also notes that she is become short of breath lying down and sleeps in a chair. In the office today she had erythematous butterfly rash across her cheeks, when asked about this she notes that it's been there for a long time but doesn't remember how long. The patient is a very poor historian and unable to give many details about her past history. She notes there is no mold in her apartment, has no pets, and no occupational exposures.   The patient has arrange for family members to come and stay with her postoperatively, she comes in today to further discuss thorascopic lung biopsy.   Current Activity/ Functional Status:  Patient is independent with mobility/ambulation, transfers, ADL's, IADL's.  patient currently lives alone  Zubrod Score: At the time of surgery this patient's most appropriate activity status/level should be described as: _0     0    Normal activity, no symptoms _1     1    Restricted in physical strenuous activity but ambulatory, able to do out light work _2     2    Ambulatory and capable of self care, unable to do work activities, up and about               >50 % of waking hours                               _3     3    Only limited self care, in bed greater than 50% of waking hours _4     4    Completely disabled, no self care, confined to bed or chair _5     5    Moribund   Past Medical History  Diagnosis Date  . Alcohol abuse   . Hypertension   . Insomnia   . Anxiety   . Depression   . Headache(784.0)   . Arthritis   . Interstitial lung disease   . Oxygen dependent   . Sleep apnea   . Shortness of breath dyspnea   . Schizophrenia     Past Surgical History  Procedure Laterality Date  . Back surgery    . Video bronchoscopy Bilateral 04/11/2014    Procedure: VIDEO BRONCHOSCOPY WITH FLUORO;  Surgeon: Kathee Delton, MD;  Location: [facility];  Service: Cardiopulmonary;  Laterality: Bilateral;  . Multiple tooth extractions      Family History  Problem Relation Age of Onset  . COPD Maternal  Aunt   . Breast cancer Mother    patient has 4 brothers one deceased from trauma and 2 sisters none have had any pulmonary problems, both her mother and father alive she does not know any of the health history of her father she has no children  History   Social History  . Marital Status: Single    Spouse Name: N/A    Number of Children: N/A  . Years of Education: N/A   Occupational History  . unemployed    Social History Main Topics  . Smoking status: Current Every Day Smoker -- 0.00 packs/day for 30 years    Types: Cigarettes  . Smokeless tobacco: Never Used     Comment: Smokes 1-2 cigs daily 07/13/14  . Alcohol Use: 0.0 oz/week    0 Not specified per week     Comment: Pt has previously abused alcohol , pt states she still drinks "every now and then"  . Drug Use: No  . Sexual Activity: Not Currently     Comment: husband locked up    Other Topics Concern  . Not on file   Social History Narrative    History  Smoking status  . Current Every Day Smoker -- 0.00 packs/day for 30 years  . Types: Cigarettes  Smokeless tobacco  . Never Used     Comment: Smokes 1-2 cigs daily 07/13/14    History  Alcohol Use  . 0.0 oz/week  . 0 Not specified per week    Comment: Pt has previously abused alcohol , pt states she still drinks "every now and then"     No Known Allergies  Current Facility-Administered Medications  Medication Dose Route Frequency Provider Last Rate Last Dose  . cefUROXime (ZINACEF) 1.5 g in dextrose 5 % 50 mL IVPB  1.5 g Intravenous 60 min Pre-Op Grace Isaac, MD       Facility-Administered Medications Ordered in Other Encounters  Medication Dose Route Frequency Provider Last Rate Last Dose  . fentaNYL (SUBLIMAZE) injection    Anesthesia Intra-op Neldon Newport, CRNA   50 mcg at 07/16/14 [phone]  . lactated ringers infusion    Continuous PRN Neldon Newport, CRNA      . midazolam (VERSED) 5 MG/5ML injection    Anesthesia Intra-op Neldon Newport, CRNA   2 mg at 07/16/14 7282     Review of Systems:     Cardiac Review of Systems: Y or N  Chest Pain [ n   ]  Resting SOB [  y ] Exertional SOB  [ y ]  Orthopnea [ y ]   Pedal Edema [ marked  ]    Palpitations [n  ] Syncope  [n  ]   Presyncope [ n  ]  General Review of Systems: [Y] = yes [  ]=no Constitional: recent weight change [ n ];  Wt loss over the last 3 months [   ] anorexia [  ]; fatigue [  ]; nausea [  ]; night sweats [  ]; fever [  ]; or chills [  ];          Dental: poor dentition[  ]; Last Dentist visit:   Eye : blurred vision [  ]; diplopia [   ]; vision changes [  ];  Amaurosis fugax[  ]; Resp: cough [ y ];  wheezing[y  ];  hemoptysis[ n ]; shortness of breath[ y ]; paroxysmal nocturnal dyspnea[ y ]; dyspnea on exertion[ y ]; or  orthopnea[ y ];  GI:  gallstones[  ], vomiting[  ];  dysphagia[  ]; melena[  ];  hematochezia [  n]; heartburn[  ];   Hx of  Colonoscopy[  ]; GU: kidney stones [  ]; hematuria[  ];   dysuria [  ];  nocturia[  ];  history of     obstruction [  ]; urinary frequency [  ]             Skin: rash, swelling[  ];, hair loss[  ];   peripheral edema[  ];  or itching[  ]; Musculosketetal: myalgias[  ];  joint swelling[  ];  joint erythema[  ];  joint pain[  ];  back pain[  ];  Heme/Lymph: bruising[  ];  bleeding[  ];  anemia[  ];  Neuro: TIA[  ];  headaches[  ];  stroke[ n ];  vertigo[  ];  seizures[  ];   paresthesias[  ];  difficulty walking[ y ];  Psych:depression[y  ]; anxiety[ y ];  Endocrine: diabetes[ n ];  thyroid dysfunction[n  ];  Immunizations: Flu up to date [ y ]; Pneumococcal up to date [n  ];  Other:  Physical Exam: BP 148/73 mmHg  Pulse 95  Temp(Src) 99.2 F (37.3 C) (Oral)  Resp 20  Ht _0  (1.626 m)  Wt 240 lb (108.863 kg)  BMI 41.18 kg/m2  LMP 07/02/2014  PHYSICAL EXAMINATION:  General appearance: alert, cooperative, appears older than stated age, mild distress, moderately obese and slowed mentation Neurologic: intact Heart: regular rate and rhythm, S1, S2 normal, no murmur, click, rub or gallop Lungs: Bilateral crackles, without active wheezing Abdomen: soft, non-tender; bowel sounds normal; no masses,  no organomegaly Extremities: edema 2+ pitting edema both ankles Patient has no carotid bruits As noted she has an erythematous rash across cheeks  Diagnostic Studies & Laboratory data:     Recent Radiology Findings:  Dg Chest 2 View  07/13/2014   CLINICAL DATA:  Preoperative exam prior left lung biopsy, patient on supplemental oxygen; history of tobacco use interstitial lung disease and hypertension  EXAM: CHEST  2 VIEW  COMPARISON:  PA and lateral chest of May 10, 2014  FINDINGS: The lungs are well-expanded. The interstitial markings are mildly increased diffusely. This is not new however. There is no alveolar infiltrate. The cardiac silhouette is mildly enlarged but stable. The pulmonary vascularity is prominent centrally. The trachea is midline. There is no pleural effusion or pneumothorax. The bony thorax is unremarkable.  IMPRESSION: Chronically increased pulmonary interstitial  markings with mild hyperinflation. There is no acute pneumonia nor CHF.   Electronically Signed   By: David  Martinique   On: 07/13/2014 14:41   CLINICAL DATA: Pulmonary nodule. Abnormal chest radiograph. Question interstitial lung disease.  EXAM: CT CHEST WITHOUT CONTRAST  TECHNIQUE: Multidetector CT imaging of the chest was performed following the standard protocol without intravenous contrast. High resolution imaging of the lungs, as well as inspiratory and expiratory imaging, was performed.  COMPARISON: Chest radiographs 01/10/2014 and 10/19/2013.  FINDINGS: No pathologically enlarged mediastinal or axillary lymph nodes. Hilar regions are difficult to definitively evaluate without IV contrast but appear grossly unremarkable. Heart size normal. No pericardial effusion. There may be a tiny hiatal hernia.  Image quality is degraded by respiratory motion. A somewhat upper lobe predominant pattern of ground-glass and centrilobular nodularity is seen superimposed on mild centrilobular emphysema. Scattered linear pulmonary parenchymal scarring. No pulmonary nodule. No traction bronchiectasis/ bronchiolectasis, architectural distortion or  honeycombing. No subpleural reticulation. There may be mild air trapping. No pleural fluid. Airway is unremarkable.  Incidental imaging of the upper abdomen shows the visualized portions of the liver, adrenal glands, spleen, pancreas and stomach to be grossly unremarkable. No upper abdominal adenopathy. No worrisome lytic or sclerotic lesions. A healed anterior right first rib fracture accounts for the questioned abnormality on recent chest radiographs.  IMPRESSION: 1. 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 Subacute hypersensitivity pneumonitis is not  excluded. 2. A healed anterior right first rib fracture accounts for the questioned abnormality on recent chest radiographs.   Electronically Signed  By: Lorin Picket M.D.  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   The goals risks and alternatives of the planned surgical procedure bronchoscopy & thoracoscopic lung biopsy have been discussed with the patient in detail. The risks of the procedure including death, infection, stroke, myocardial infarction, bleeding, blood transfusion have all been discussed specifically.  I have quoted Diane Gilbert a 3 % of perioperative mortality and a complication rate as high as 25 %. The patient's questions have been answered.Diane Gilbert is willing  to proceed with the planned procedure.   Grace Isaac MD      [address] [place] [address] Office [phone]   Beeper [phone]  07/16/2014 7:10 AM

## 2014-07-16 NOTE — Anesthesia Postprocedure Evaluation (Signed)
Anesthesia Post Note  Patient: Diane Gilbert  Procedure(s) Performed: Procedure(s) (LRB): VIDEO BRONCHOSCOPY (N/A) VIDEO ASSISTED THORACOSCOPY (Left) LUNG BIOPSY (Left) WEDGE RESECTION (Left)  Anesthesia type: General  Patient location: PACU  Post pain: Pain level controlled  Post assessment: Post-op Vital signs reviewed  Last Vitals: BP 133/80 mmHg  Pulse 99  Temp(Src) 36.4 C (Oral)  Resp 15  Ht _0  (1.626 m)  Wt 240 lb (108.863 kg)  BMI 41.18 kg/m2  SpO2 94%  LMP 07/02/2014  Post vital signs: Reviewed  Level of consciousness: sedated  Complications: No apparent anesthesia complications

## 2014-07-16 NOTE — Transfer of Care (Signed)
Immediate Anesthesia Transfer of Care Note  Patient: Diane Gilbert  Procedure(s) Performed: Procedure(s) with comments: VIDEO BRONCHOSCOPY (N/A) VIDEO ASSISTED THORACOSCOPY (Left) LUNG BIOPSY (Left) WEDGE RESECTION (Left) - left upper lobe lung resection  Patient Location: PACU  Anesthesia Type:General  Level of Consciousness: sedated  Airway & Oxygen Therapy: Patient Spontanous Breathing and Patient connected to face mask oxygen  Post-op Assessment: Report given to PACU RN, Post -op Vital signs reviewed and stable and Patient moving all extremities X 4  Post vital signs: Reviewed and stable  Complications: No apparent anesthesia complications

## 2014-07-17 ENCOUNTER — Inpatient Hospital Stay (HOSPITAL_COMMUNITY): Payer: Medicaid Other

## 2014-07-17 LAB — GLUCOSE, CAPILLARY
Glucose-Capillary: 138 mg/dL — ABNORMAL HIGH (ref 70–99)
Glucose-Capillary: 135 mg/dL — ABNORMAL HIGH (ref 70–99)
Glucose-Capillary: 136 mg/dL — ABNORMAL HIGH (ref 70–99)
Glucose-Capillary: 135 mg/dL — ABNORMAL HIGH (ref 70–99)
Glucose-Capillary: 130 mg/dL — ABNORMAL HIGH (ref 70–99)
Glucose-Capillary: 121 mg/dL — ABNORMAL HIGH (ref 70–99)

## 2014-07-17 LAB — BASIC METABOLIC PANEL
ANION GAP: 8 (ref 5–15)
CO2: 25 mmol/L (ref 19–32)
Calcium: 8.5 mg/dL (ref 8.4–10.5)
Chloride: 104 mEq/L (ref 96–112)
Creatinine, Ser: 0.77 mg/dL (ref 0.50–1.10)
GFR calc Af Amer: >90 mL/min (ref >90)
GFR calc non Af Amer: >90 mL/min (ref >90)
GLUCOSE: 140 mg/dL — AB (ref 70–99)
Potassium: 3.9 mmol/L (ref 3.5–5.1)
SODIUM: 137 mmol/L (ref 135–145)

## 2014-07-17 LAB — BLOOD GAS, ARTERIAL
ACID-BASE EXCESS: 3.6 mmol/L — AB (ref 0.0–2.0)
Bicarbonate: 29.5 mEq/L — ABNORMAL HIGH (ref 20.0–24.0)
DRAWN BY: 252031
O2 Content: 3 L/min
O2 Saturation: 94.8 %
PCO2 ART: 61.5 mmHg — AB (ref 35.0–45.0)
Patient temperature: 98.6
TCO2: 31.4 mmol/L (ref 0–100)
pH, Arterial: 7.303 — ABNORMAL LOW (ref 7.350–7.450)
pO2, Arterial: 81.4 mmHg (ref 80.0–100.0)

## 2014-07-17 LAB — CBC
HCT: 40.2 % (ref 36.0–46.0)
HEMOGLOBIN: 12.2 g/dL (ref 12.0–15.0)
MCH: 26.8 pg (ref 26.0–34.0)
MCHC: 30.3 g/dL (ref 30.0–36.0)
MCV: 88.4 fL (ref 78.0–100.0)
Platelets: 192 10*3/uL (ref 150–400)
RBC: 4.55 MIL/uL (ref 3.87–5.11)
RDW: 14.2 % (ref 11.5–15.5)
WBC: 14 10*3/uL — ABNORMAL HIGH (ref 4.0–10.5)

## 2014-07-17 MED ORDER — PANTOPRAZOLE SODIUM 40 MG PO TBEC
40.0000 mg | DELAYED_RELEASE_TABLET | Freq: Every day | ORAL | Status: DC
  Administered 2014-07-18 – 2014-07-19 (×2): 40 mg via ORAL
  Filled 2014-07-17 (×2): qty 1

## 2014-07-17 NOTE — Progress Notes (Addendum)
60 mcgs of fentanyl IV wasted with Meribeth Mattes, RN  Co-signed by Meribeth Mattes RN

## 2014-07-17 NOTE — Progress Notes (Signed)
Patient ID: Diane Gilbert, female   DOB: 08-21-1965, 49 y.o.   MRN: [id number] [facility] DAILY ICU PROGRESS NOTE                   [address]            [place] [address]          [phone]   1 Day Post-Op Procedure(s) (LRB): VIDEO BRONCHOSCOPY (N/A) VIDEO ASSISTED THORACOSCOPY (Left) LUNG BIOPSY (Left) WEDGE RESECTION (Left)  Total Length of Stay:  LOS: 1 day   Subjective: Up to chair comfortable awake and alert   Objective: Vital signs in last 24 hours: Temp:  [97.5 F (36.4 C)-98.6 F (37 C)] 98.2 F (36.8 C) (01/19 0400) Pulse Rate:  [74-105] 80 (01/19 0700) Cardiac Rhythm:  [-] Normal sinus rhythm (01/18 2000) Resp:  [12-29] 15 (01/19 0700) BP: (91-158)/(55-82) 141/72 mmHg (01/19 0700) SpO2:  [88 %-100 %] 100 % (01/19 0700) Arterial Line BP: (133-182)/(58-87) 152/70 mmHg (01/19 0700) FiO2 (%):  [45 %-80 %] 45 % (01/18 2000) Weight:  [239 lb 1.6 oz (108.455 kg)] 239 lb 1.6 oz (108.455 kg) (01/19 0555)  Filed Weights   07/16/14 0603 07/17/14 0555  Weight: 240 lb (108.863 kg) 239 lb 1.6 oz (108.455 kg)    Weight change: -14.4 oz (-0.408 kg)   Hemodynamic parameters for last 24 hours:    Intake/Output from previous day: 01/18 0701 - 01/19 0700 In: 5156.7 [P.O.:1870; I.V.:3186.7; IV Piggyback:100] Out: 0347 [QQVZD:6387; Chest Tube:180]  Intake/Output this shift:    Current Meds: Scheduled Meds: . acetaminophen  1,000 mg Oral 4 times per day   Or  . acetaminophen (TYLENOL) oral liquid 160 mg/5 mL  1,000 mg Oral 4 times per day  . amLODipine  5 mg Oral Daily  . antiseptic oral rinse  7 mL Mouth Rinse BID  . ARIPiprazole  25 mg Oral Daily  . bisacodyl  10 mg Oral Daily  . buPROPion  150 mg Oral BID  . enoxaparin (LOVENOX) injection  30 mg Subcutaneous Q24H  . fentaNYL   Intravenous 6 times per day  . furosemide  20 mg Oral Daily  . gabapentin  100 mg Oral TID AC  . gabapentin  400 mg Oral QHS  . insulin aspart  0-24 Units  Subcutaneous 4 times per day  . pantoprazole (PROTONIX) IV  40 mg Intravenous Q24H  . senna-docusate  1 tablet Oral QHS   Continuous Infusions: . dextrose 5 % and 0.9% NaCl 100 mL/hr at 07/17/14 0000   PRN Meds:.clotrimazole, diphenhydrAMINE **OR** diphenhydrAMINE, hydrALAZINE, levalbuterol, naloxone **AND** sodium chloride, ondansetron (ZOFRAN) IV, ondansetron (ZOFRAN) IV, potassium chloride, traMADol  General appearance: alert and cooperative Neurologic: intact Heart: regular rate and rhythm, S1, S2 normal, no murmur, click, rub or gallop Lungs: diminished breath sounds bibasilar Abdomen: soft, non-tender; bowel sounds normal; no masses,  no organomegaly Extremities: pas on Wound: no air leak Lab Results: CBC: Recent Labs  07/17/14 0500  WBC 14.0*  HGB 12.2  HCT 40.2  PLT 192   BMET:  Recent Labs  07/17/14 0500  NA 137  K 3.9  CL 104  CO2 25  GLUCOSE 140*  BUN <5*  CREATININE 0.77  CALCIUM 8.5    PT/INR: No results for input(s): LABPROT, INR in the last 72 hours. Radiology: Dg Chest Port 1 View  07/17/2014   CLINICAL DATA:  Hypertension.  Dyspnea  EXAM: PORTABLE CHEST - 1 VIEW  COMPARISON:  July 16, 2014  FINDINGS: There is a chest tube on the left. Central catheter tip is in the superior vena cava. No pneumothorax. There is no edema or consolidation. Heart is mildly enlarged with pulmonary vascularity within normal limits. No adenopathy. No bone lesions.  IMPRESSION: Tube and catheter positions as described without pneumothorax. Currently no edema or consolidation. Heart mildly enlarged but stable.   Electronically Signed   By: Lowella Grip M.D.   On: 07/17/2014 07:36   Dg Chest Port 1 View  07/16/2014   CLINICAL DATA:  Pneumothorax  EXAM: PORTABLE CHEST - 1 VIEW  COMPARISON:  07/13/2013  FINDINGS: Central mild vascular congestion. There is bilateral mild interstitial prominence and hazy opacification highly suspicious for mild pulmonary edema. Right IJ central  line with tip in SVC. Probable left chest tube. No definite pneumothorax pre  IMPRESSION: There is bilateral mild interstitial prominence and hazy opacification highly suspicious for mild pulmonary edema. Right IJ central line with tip in SVC. Probable left chest tube. No definite pneumothorax   Electronically Signed   By: Lahoma Crocker M.D.   On: 07/16/2014 11:43     Assessment/Plan: S/P Procedure(s) (LRB): VIDEO BRONCHOSCOPY (N/A) VIDEO ASSISTED THORACOSCOPY (Left) LUNG BIOPSY (Left) WEDGE RESECTION (Left) Mobilize Diabetes control ct to water seal To steopdown Path pending     [name] 07/17/2014 7:59 AM

## 2014-07-17 NOTE — Progress Notes (Signed)
PULMONARY / CRITICAL CARE MEDICINE   Name: Diane Gilbert MRN: [id number] DOB: Aug 08, 1965    ADMISSION DATE:  07/16/2014 CONSULTATION DATE:  07/16/2014  REFERRING MD :  Dr. Servando Snare  CHIEF COMPLAINT:  S/p VATS  INITIAL PRESENTATION:  49 yo female smoker with GGO on CT chest.  Had Lt VATS bx on 1/18.  PCCM consulted to assist with post-op respiratory management.  She is followed by Dr. Gwenette Greet in pulmonary office.  STUDIES:  01/22/14 HRCT chest >> GGO, centrilobular nodularity  SIGNIFICANT EVENTS: 1/18 underwent L VATs, with wedge resection of L upper and lower lung. To ICU post op with L chest tube. 1/19 transfer to SDU  SUBJECTIVE:  Breathing better.  Has soreness at chest tube site >> controlled with PCA.  VITAL SIGNS: Temp:  [97.5 F (36.4 C)-98.6 F (37 C)] 97.9 F (36.6 C) (01/19 0800) Pulse Rate:  [74-105] 88 (01/19 0900) Resp:  [12-29] 14 (01/19 0900) BP: (91-158)/(55-82) 117/65 mmHg (01/19 0900) SpO2:  [88 %-100 %] 95 % (01/19 0900) Arterial Line BP: (133-182)/(58-87) 144/74 mmHg (01/19 0800) FiO2 (%):  [45 %-80 %] 45 % (01/18 2000) Weight:  [239 lb 1.6 oz (108.455 kg)] 239 lb 1.6 oz (108.455 kg) (01/19 0555) INTAKE / OUTPUT:  Intake/Output Summary (Last 24 hours) at 07/17/14 1003 Last data filed at 07/17/14 0900  Gross per 24 hour  Intake 4566.67 ml  Output   4870 ml  Net -303.33 ml    PHYSICAL EXAMINATION: General: pleasant Neuro: normal strength HEENT: no sinus tenderness Cardiovascular: regular Lungs: scattered rhonchi, Lt chest tube in place Abdomen:  Obese, soft, non-tender Musculoskeletal:  No acute deformity Skin: no edema  LABS:  CBC  Recent Labs Lab 07/13/14 1122 07/17/14 0500  WBC 10.0 14.0*  HGB 13.5 12.2  HCT 43.7 40.2  PLT 223 192   Coag's  Recent Labs Lab 07/13/14 1122  APTT 28  INR 1.01   BMET  Recent Labs Lab 07/13/14 1122 07/17/14 0500  NA 137 137  K 4.5 3.9  CL 102 104  CO2 30 25  BUN <5* <5*   CREATININE 0.86 0.77  GLUCOSE 100* 140*   Electrolytes  Recent Labs Lab 07/13/14 1122 07/17/14 0500  CALCIUM 9.1 8.5   ABG  Recent Labs Lab 07/16/14 0605 07/17/14 0302  PHART 7.371 7.303*  PCO2ART 47.3* 61.5*  PO2ART 52.9* 81.4   Liver Enzymes  Recent Labs Lab 07/13/14 1122  AST 31  ALT 25  ALKPHOS 60  BILITOT 0.5  ALBUMIN 3.7   Glucose  Recent Labs Lab 07/16/14 1637 07/16/14 2352 07/17/14 0504  GLUCAP 156* 138* 135*    Imaging Dg Chest Port 1 View  07/16/2014   CLINICAL DATA:  Pneumothorax  EXAM: PORTABLE CHEST - 1 VIEW  COMPARISON:  07/13/2013  FINDINGS: Central mild vascular congestion. There is bilateral mild interstitial prominence and hazy opacification highly suspicious for mild pulmonary edema. Right IJ central line with tip in SVC. Probable left chest tube. No definite pneumothorax pre  IMPRESSION: There is bilateral mild interstitial prominence and hazy opacification highly suspicious for mild pulmonary edema. Right IJ central line with tip in SVC. Probable left chest tube. No definite pneumothorax   Electronically Signed   By: Lahoma Crocker M.D.   On: 07/16/2014 11:43     ASSESSMENT / PLAN:  Dyspnea, hx of smoking, and GGO with centrilobular nodularity on CT chest.  Bronchoscopy negative.  Serology, HP panel negative.  S/p Lt VATS bx 1/18. Plan: Post-op care,  chest tubes per TCTS Bronchial hygiene Oxygen to keep SpO2 > 92% Reported hx of OSA >> pt states she does not use CPAP at home Pain control per primary team  Hx of HTN. Plan: Continue home meds  PCCM will sign off.   I have scheduled her for f/u in pulmonary office with Dr. Gwenette Greet on 07/25/14 at [place], [place] 07/17/2014, 10:14 AM Pager:  [phone] After 3pm call: [phone]

## 2014-07-17 NOTE — Progress Notes (Signed)
CRITICAL VALUE ALERT  Critical value received:  ABG PCO2 61.5  Date of notification:  07/17/2014  Time of notification:  0330  Critical value read back:Yes.    Nurse who received alert:  Candyce Churn Rn  MD notified (1st page):  Dr. Roxan Hockey  Time of first page:  0400  MD notified (2nd page):  Time of second page:  Responding MD:  Dr. Roxan Hockey  Time MD responded:  Called directly

## 2014-07-18 ENCOUNTER — Encounter (HOSPITAL_COMMUNITY): Payer: Self-pay | Admitting: Cardiothoracic Surgery

## 2014-07-18 ENCOUNTER — Inpatient Hospital Stay (HOSPITAL_COMMUNITY): Payer: Medicaid Other

## 2014-07-18 LAB — GLUCOSE, CAPILLARY
Glucose-Capillary: 113 mg/dL — ABNORMAL HIGH (ref 70–99)
Glucose-Capillary: 120 mg/dL — ABNORMAL HIGH (ref 70–99)
Glucose-Capillary: 129 mg/dL — ABNORMAL HIGH (ref 70–99)
Glucose-Capillary: 130 mg/dL — ABNORMAL HIGH (ref 70–99)

## 2014-07-18 LAB — COMPREHENSIVE METABOLIC PANEL
ALK PHOS: 52 U/L (ref 39–117)
ALT: 17 U/L (ref 0–35)
ANION GAP: 9 (ref 5–15)
AST: 23 U/L (ref 0–37)
Albumin: 3.3 g/dL — ABNORMAL LOW (ref 3.5–5.2)
BILIRUBIN TOTAL: 0.7 mg/dL (ref 0.3–1.2)
BUN: 6 mg/dL (ref 6–23)
CO2: 31 mmol/L (ref 19–32)
Calcium: 8.8 mg/dL (ref 8.4–10.5)
Chloride: 99 mEq/L (ref 96–112)
Creatinine, Ser: 0.73 mg/dL (ref 0.50–1.10)
GFR calc Af Amer: >90 mL/min (ref >90)
GFR calc non Af Amer: >90 mL/min (ref >90)
GLUCOSE: 96 mg/dL (ref 70–99)
Potassium: 3.9 mmol/L (ref 3.5–5.1)
SODIUM: 139 mmol/L (ref 135–145)
Total Protein: 7.2 g/dL (ref 6.0–8.3)

## 2014-07-18 LAB — CBC
HEMATOCRIT: 40.2 % (ref 36.0–46.0)
HEMOGLOBIN: 12.3 g/dL (ref 12.0–15.0)
MCH: 27 pg (ref 26.0–34.0)
MCHC: 30.6 g/dL (ref 30.0–36.0)
MCV: 88.4 fL (ref 78.0–100.0)
Platelets: 197 10*3/uL (ref 150–400)
RBC: 4.55 MIL/uL (ref 3.87–5.11)
RDW: 14.3 % (ref 11.5–15.5)
WBC: 10.1 10*3/uL (ref 4.0–10.5)

## 2014-07-18 NOTE — Progress Notes (Addendum)
[facility] DAILY ICU PROGRESS NOTE                   [address]            [place] [address]          [phone]   2 Days Post-Op Procedure(s) (LRB): VIDEO BRONCHOSCOPY (N/A) VIDEO ASSISTED THORACOSCOPY (Left) LUNG BIOPSY (Left) WEDGE RESECTION (Left)  Total Length of Stay:  LOS: 2 days   Subjective: Feels ok, not SOB  Objective: Vital signs in last 24 hours: Temp:  [98.2 F (36.8 C)-99 F (37.2 C)] 98.6 F (37 C) (01/20 0800) Pulse Rate:  [77-89] 77 (01/20 0416) Cardiac Rhythm:  [-] Normal sinus rhythm (01/19 2300) Resp:  [17-37] 18 (01/20 0806) BP: (139-165)/(73-96) 139/79 mmHg (01/20 0416) SpO2:  [96 %-99 %] 97 % (01/20 0806)  Filed Weights   07/16/14 0603 07/17/14 0555  Weight: 240 lb (108.863 kg) 239 lb 1.6 oz (108.455 kg)    Weight change:    Hemodynamic parameters for last 24 hours:    Intake/Output from previous day: 01/19 0701 - 01/20 0700 In: 2308.3 [P.O.:2000; I.V.:308.3] Out: 2405 [Urine:2175; Chest Tube:230]  Intake/Output this shift:    Current Meds: Scheduled Meds: . acetaminophen  1,000 mg Oral 4 times per day   Or  . acetaminophen (TYLENOL) oral liquid 160 mg/5 mL  1,000 mg Oral 4 times per day  . amLODipine  5 mg Oral Daily  . ARIPiprazole  25 mg Oral Daily  . bisacodyl  10 mg Oral Daily  . buPROPion  150 mg Oral BID  . enoxaparin (LOVENOX) injection  30 mg Subcutaneous Q24H  . fentaNYL   Intravenous 6 times per day  . furosemide  20 mg Oral Daily  . gabapentin  100 mg Oral TID AC  . gabapentin  400 mg Oral QHS  . insulin aspart  0-24 Units Subcutaneous 4 times per day  . pantoprazole  40 mg Oral Daily  . senna-docusate  1 tablet Oral QHS   Continuous Infusions: . dextrose 5 % and 0.9% NaCl 10 mL/hr at 07/17/14 1850   PRN Meds:.clotrimazole, diphenhydrAMINE **OR** diphenhydrAMINE, hydrALAZINE, levalbuterol, naloxone **AND** sodium chloride, ondansetron (ZOFRAN) IV, ondansetron (ZOFRAN) IV, potassium chloride,  traMADol  General appearance: alert, cooperative and no distress Heart: regular rate and rhythm Lungs: clear to auscultation bilaterally Abdomen: benign Extremities: no edema Wound: incis healing well  Lab Results: CBC: Recent Labs  07/17/14 0500 07/18/14 0500  WBC 14.0* 10.1  HGB 12.2 12.3  HCT 40.2 40.2  PLT 192 197   BMET:  Recent Labs  07/17/14 0500 07/18/14 0500  NA 137 139  K 3.9 3.9  CL 104 99  CO2 25 31  GLUCOSE 140* 96  BUN <5* 6  CREATININE 0.77 0.73  CALCIUM 8.5 8.8    PT/INR: No results for input(s): LABPROT, INR in the last 72 hours. Radiology: Dg Chest Port 1 View  07/18/2014   CLINICAL DATA:  Status post bronchoscopy and left VATS on 07/16/2014 with wedge resection of left lung.  EXAM: PORTABLE CHEST - 1 VIEW  COMPARISON:  07/17/2014  FINDINGS: Central line positioning stable in the lower SVC. Left chest tube remains with no evidence of pneumothorax. Aeration stable with mild bibasilar atelectasis present. No edema or focal airspace consolidation. The heart size is normal.  IMPRESSION: Stable bibasilar atelectasis.  No pneumothorax.   Electronically Signed   By: Aletta Edouard M.D.   On: 07/18/2014 08:12  Dg Chest Port 1 View  07/17/2014   CLINICAL DATA:  Hypertension.  Dyspnea  EXAM: PORTABLE CHEST - 1 VIEW  COMPARISON:  July 16, 2014  FINDINGS: There is a chest tube on the left. Central catheter tip is in the superior vena cava. No pneumothorax. There is no edema or consolidation. Heart is mildly enlarged with pulmonary vascularity within normal limits. No adenopathy. No bone lesions.  IMPRESSION: Tube and catheter positions as described without pneumothorax. Currently no edema or consolidation. Heart mildly enlarged but stable.   Electronically Signed   By: Lowella Grip M.D.   On: 07/17/2014 07:36   Dg Chest Port 1 View  07/16/2014   CLINICAL DATA:  Pneumothorax  EXAM: PORTABLE CHEST - 1 VIEW  COMPARISON:  07/13/2013  FINDINGS: Central mild  vascular congestion. There is bilateral mild interstitial prominence and hazy opacification highly suspicious for mild pulmonary edema. Right IJ central line with tip in SVC. Probable left chest tube. No definite pneumothorax pre  IMPRESSION: There is bilateral mild interstitial prominence and hazy opacification highly suspicious for mild pulmonary edema. Right IJ central line with tip in SVC. Probable left chest tube. No definite pneumothorax   Electronically Signed   By: Lahoma Crocker M.D.   On: 07/16/2014 11:43     Assessment/Plan: S/P Procedure(s) (LRB): VIDEO BRONCHOSCOPY (N/A) VIDEO ASSISTED THORACOSCOPY (Left) LUNG BIOPSY (Left) WEDGE RESECTION (Left)   1 good progress with recovery 2 no air leak - will d/c chest tube today   3 poss home in am     Diane Gilbert,Diane Gilbert 07/18/2014 11:15 AM  Walking well, no air leak chest tube out today Plan home tomorrow I have seen and examined Diane Gilbert and agree with the above assessment  and plan.  Grace Isaac MD [facility] [phone] Office [phone] 07/18/2014 12:09 PM

## 2014-07-18 NOTE — Discharge Instructions (Signed)
Thoracoscopy Care After Refer to this sheet in the next few weeks. These discharge instructions provide you with general information on caring for yourself after you leave the hospital. Your caregiver may also give you specific instructions. Your treatment has been planned according to the most current medical practices available, but unavoidable complications sometimes occur. If you have any problems or questions after discharge, call your caregiver. HOME CARE INSTRUCTIONS   Remove the bandage (dressing) over your chest tube site as directed by your caregiver.  It is normal to be sore for a couple weeks following surgery. See your caregiver if this seems to be getting worse rather than better.  Only take over-the-counter or prescription medicines for pain, discomfort, or fever as directed by your caregiver. It is very important to take pain medicine when you need it so that you will cough and breathe deeply enough to clear mucus (phlegm) and expand your lungs.  If it hurts to cough, hold a pillow against your chest when you cough. This may help with the discomfort. In spite of the discomfort, cough frequently, as this helps protect against getting an infection in your lung (pneumonia).  Taking deep breaths keeps lungs inflated and protects against pneumonia. Most patients will go home with an incentive spirometer that encourages deep breathing.  You may resume a normal diet and activities as directed.  Use showers for bathing until you see your caregiver, or as instructed.  Change dressings if necessary or as directed.  Avoid lifting or driving until you are instructed otherwise.  Make an appointment to see your caregiver for stitch (suture) or staple removal when instructed.  Do not travel by airplane for 2 weeks after the chest tube is removed. SEEK MEDICAL CARE IF:   You are bleeding from your wounds.  You have redness, swelling, or increasing pain in the wounds.  Your heartbeat  feels irregular or very fast.  There is pus coming from your wounds.  There is a bad smell coming from the wound or dressing. SEEK IMMEDIATE MEDICAL CARE IF:   You have a fever.  You develop a rash.  You have difficulty breathing.  You develop any reaction or side effects to medicines given.  You develop lightheadedness or feel faint.  You develop shortness of breath or chest pain. MAKE SURE YOU:   Understand these instructions.  Will watch your condition.  Will get help right away if you are not doing well or get worse. Document Released: 01/02/2005 Document Revised: 09/07/2011 Document Reviewed: 12/03/2010 [facility] Patient Information 2015 [place], [place]. This information is not intended to replace advice given to you by your health care provider. Make sure you discuss any questions you have with your health care provider.

## 2014-07-18 NOTE — Discharge Summary (Addendum)
Physician Discharge Summary  Patient ID: Diane Gilbert MRN: [id number] DOB/AGE: 08/12/1965 49 y.o.  Admit date: 07/16/2014 Discharge date: 07/18/2014  Admission Diagnoses: Probable interstitial lung disease  Discharge Diagnoses:  After discharge final path reported back: Discharge dx: LUNG TISSUE WITH RESPIRATORY BRONCHIOLITIS ASSOCIATED INTERSTITIAL LUNG   DISEASE (RB-ILD) \ Active Problems:   ILD (interstitial lung disease)  Patient Active Problem List   Diagnosis Date Noted  . ILD (interstitial lung disease) 07/16/2014  . Chronic respiratory failure 06/07/2014  . Respiratory bronchiolitis interstitial lung disease 12/26/2013  . BV (bacterial vaginosis) 01/23/2013  . Unspecified symptom associated with female genital organs 01/23/2013  . Irregular menstrual cycle 01/23/2013  . Smoking addiction 01/23/2013  . Alcohol abuse 02/19/2012  . Dehydration 02/19/2012  . Hyponatremia 02/19/2012  . Nausea vomiting and diarrhea 02/19/2012  . Pyelonephritis 02/19/2012  . Renal insufficiency 02/19/2012  . Abdominal pain 02/19/2012  . Hypokalemia 02/19/2012  . Leukocytosis 02/19/2012  . Anemia 02/19/2012  . Trichomoniasis 02/19/2012    Pathology report: Currently pending   Discharged Condition: good  History of Present Illness: At time of consultation Diane Gilbert 49 y.o. female is seen in the office at the request of Dr. Gwenette Greet to consider thorascopic lung biopsy. The patient has had progressive worsening of her respiratory status, to the point where last week she was started on oxygen. Previous bronchoscopy and BAL have been unrevealing, the patient was started on steroids without much response. Unfortunately she's been a smoker and at times up to 3 packs a day for more than 25 years. She's now down to several cigarettes a day. She also notes that she is become short of breath lying down and sleeps in a chair. In the office today she had erythematous butterfly  rash across her cheeks, when asked about this she notes that it's been there for a long time but doesn't remember how long. The patient is a very poor historian and unable to give many details about her past history. She notes there is no mold in her apartment, has no pets, and no occupational exposures. She was admitted this hospitalization for that he'll assisted thoracoscopic surgery for biopsy.  Hospital Course: The patient was admitted electively and underwent surgery on 07/16/2014. Pathology is currently pending. She was seen in consultation during the postoperative period by pulmonary medicine who assisted with management. All routine lines, monitors and drainage devices have been discontinued in the standard fashion. She showed a good and gradual clinical improvement. She does have some systolic hypertension and Norvasc is adjusted to hopefully improve blood pressure control. She will need follow-up as an outpatient for her blood pressure. Incisions are healing well without evidence of infection. Chest x-ray is stable in appearance without pneumothorax. Oxygen has been weaned and she maintains good saturations on room air. Her overall status is felt to be quite stable for discharge on today's date.  Consults: pulmonary/intensive care   Treatments: DATE OF PROCEDURE: 07/16/2014 DATE OF DISCHARGE:   OPERATIVE REPORT   PREOPERATIVE DIAGNOSIS: Question of interstitial lung disease.  POSTOPERATIVE DIAGNOSIS: Question of interstitial lung disease. Final pathology pending.  SURGICAL PROCEDURE: Bronchoscopy, left video-assisted thoracoscopy with wedge resection of the lung x2, left upper lobe and left lower lobe.  SURGEON: Lanelle Bal, MD  FIRST ASSISTANT: John Giovanni, PA.  Discharge Exam: Blood pressure 139/79, pulse 77, temperature 98.6 F (37 C), temperature source Oral, resp. rate 18, height _0  (1.626 m), weight 239 lb 1.6 oz (108.455  kg), last  menstrual period 07/02/2014, SpO2 97 %.  General appearance: alert, cooperative and no distress Heart: regular rate and rhythm Lungs: coarse BS Abdomen: soft, nontender Extremities: no edema Wound: incis healing well Disposition: 01-Home or Self Care      Medication List    TAKE these medications        albuterol 108 (90 BASE) MCG/ACT inhaler  Commonly known as:  PROVENTIL HFA;VENTOLIN HFA  Inhale 2 puffs into the lungs every 6 (six) hours as needed for wheezing or shortness of breath.     amLODipine 10 MG tablet  Commonly known as:  NORVASC  Take 1 tablet (10 mg total) by mouth daily.     ARIPiprazole 5 MG tablet  Commonly known as:  ABILIFY  Take 25 mg by mouth daily.     beclomethasone 80 MCG/ACT inhaler  Commonly known as:  QVAR  Inhale 2 puffs into the lungs 2 (two) times daily.     buPROPion 150 MG 12 hr tablet  Commonly known as:  WELLBUTRIN SR  Take 150 mg by mouth 2 (two) times daily.     clotrimazole 1 % cream  Commonly known as:  LOTRIMIN  Apply 1 application topically 2 (two) times daily.     furosemide 20 MG tablet  Commonly known as:  LASIX  Take 20 mg by mouth daily.     gabapentin 100 MG capsule  Commonly known as:  NEURONTIN  Take 100-400 mg by mouth 4 (four) times daily. Takes 100 mg 3 times daily and 400 mg at bedtime     oxyCODONE-acetaminophen 5-325 MG per tablet  Commonly known as:  PERCOCET/ROXICET  Take 1 tablet by mouth every 4 (four) hours as needed for moderate pain. As needed     QUEtiapine 100 MG tablet  Commonly known as:  SEROQUEL  Take 100 mg by mouth at bedtime.     tiZANidine 4 MG tablet  Commonly known as:  ZANAFLEX  Take 4 mg by mouth 2 (two) times daily as needed for muscle spasms.     traZODone 150 MG tablet  Commonly known as:  DESYREL  Take 150 mg by mouth at bedtime.       Follow-up Information    Follow up with Kathee Delton, MD On 07/25/2014.   Specialty:  Pulmonary Disease   Why:  10 AM    Contact information:   [place] [address] [phone]       Follow up with Grace Isaac, MD.   Specialty:  Cardiothoracic Surgery   Why:  08/02/2014 at 1:30 PM. Please obtain a chest x-ray at 1 PM at [facility]. [facility] is located in the same office complex as the Psychologist, sport and exercise.   Contact information:   [place] [address] [phone]       Follow up with [facility].   Why:  nurse at office will remove chest tube sutures- they will contact you with appointment   Contact information:   [place] [id number]       Signed: John Giovanni 07/18/2014, 12:03 PM

## 2014-07-19 ENCOUNTER — Inpatient Hospital Stay (HOSPITAL_COMMUNITY): Payer: Medicaid Other

## 2014-07-19 LAB — TISSUE CULTURE
Culture: NO GROWTH
Culture: NO GROWTH

## 2014-07-19 LAB — GLUCOSE, CAPILLARY: Glucose-Capillary: 176 mg/dL — ABNORMAL HIGH (ref 70–99)

## 2014-07-19 MED ORDER — AMLODIPINE BESYLATE 10 MG PO TABS: 10.0000 mg | ORAL_TABLET | Freq: Every day | ORAL | Status: DC

## 2014-07-19 MED ORDER — OXYCODONE-ACETAMINOPHEN 5-325 MG PO TABS: 1.0000 | ORAL_TABLET | ORAL | Status: DC | PRN

## 2014-07-19 NOTE — Progress Notes (Signed)
15m of fentanyl PCA WIS. Witnessed by WMerlene Laughter RN.

## 2014-07-19 NOTE — Progress Notes (Signed)
Discussed AVS and prescriptions with pt. Pt verbalizes understanding. Central line and IVs have been removed. Notified CCMD, monitor removed. All personal belongings with pt at bedside. Discharged to home.

## 2014-07-19 NOTE — Op Note (Signed)
NAMEMarland Kitchen  TALI, CLEAVES NO.:  [id number]  MEDICAL RECORD NO.:  [id number]  LOCATION:  2G31D                        FACILITY:  [facility]  PHYSICIAN:  Lanelle Bal, MD    DATE OF BIRTH:  Jan 30, 1966  DATE OF PROCEDURE:  07/16/2014 DATE OF DISCHARGE:                              OPERATIVE REPORT   PREOPERATIVE DIAGNOSIS:  Question of interstitial lung disease.  POSTOPERATIVE DIAGNOSIS:  Question of interstitial lung disease.  Final pathology pending.  SURGICAL PROCEDURE:  Bronchoscopy, left video-assisted thoracoscopy with wedge resection of the lung x2, left upper lobe and left lower lobe.  SURGEON:  Lanelle Bal, MD  FIRST ASSISTANT:  John Giovanni, PA.  BRIEF HISTORY:  The patient is a 49 year old female with a long smoking history who presents with progressive interstitial lung changes and shortness of breath.  She is referred by Dr. Gwenette Greet for lung biopsy after attempted bronchoscopy and biopsy was unrevealing with diagnostic material.  Risks and options of surgery were discussed with the patient in detail.  She agreed and signed informed consent.  DESCRIPTION OF PROCEDURE:  With central line and arterial line in place, the patient underwent general endotracheal anesthesia without incident. Appropriate time-out was performed.  Through the single-lumen endotracheal tube, a fiberoptic bronchoscope was passed to the subsegmental level without evidence of endobronchial lesion.  She did have significant amount of secretions, nonbloody. The scope was removed. A double-lumen endotracheal tube was then placed, and the patient was turned in lateral decubitus position with the left side up.  The left chest was prepped with Betadine and draped in a sterile manner.  Three port sites were performed, 1 in the mid axillary line at approximately the seventh intercostal space, and one more anterior, one superiorly and anteriorly, and one posteriorly and anteriorly.   Through these 3 ports, we were able to manipulate the lung, and formal wedge biopsies stapling the upper lobe and lower lobe.  A portion of the specimen was submitted to pathology for culture.  The remaining tissue was sent for histology. At the completion of the procedure, through the middle port, a 28 Blake drain was left in place to evacuate air from the chest and the anterior and posterior ports were closed with interrupted 2-0 Vicryl, and a running 3-0 subcuticular stitch.  Dermabond was applied.  The lung had reinflated nicely.  There was no air leak.  Blood loss was minimal. Sponge and needle count was reported as correct at the completion of procedure.  The patient tolerated the procedure without obvious complication.  She was extubated in the operating room and transferred to the recovery room for postoperative care.     Lanelle Bal, MD     EG/MEDQ  D:  07/18/2014  T:  07/19/2014  Job:  [id number]

## 2014-07-19 NOTE — Progress Notes (Signed)
[address]       [place] [address]             [phone]      3 Days Post-Op Procedure(s) (LRB): VIDEO BRONCHOSCOPY (N/A) VIDEO ASSISTED THORACOSCOPY (Left) LUNG BIOPSY (Left) WEDGE RESECTION (Left) Subjective: Feeling pretty well, not much SOB, not much pain  Objective: Vital signs in last 24 hours: Temp:  [97.8 F (36.6 C)-99 F (37.2 C)] 97.8 F (36.6 C) (01/21 0408) Pulse Rate:  [76-99] 76 (01/21 0408) Cardiac Rhythm:  [-] Normal sinus rhythm (01/20 2000) Resp:  [14-21] 14 (01/21 0408) BP: (154-181)/(77-99) 158/99 mmHg (01/21 0408) SpO2:  [94 %-100 %] 100 % (01/21 0408)  Hemodynamic parameters for last 24 hours:    Intake/Output from previous day: 01/20 0701 - 01/21 0700 In: 220 [I.V.:220] Out: 2030 [Urine:1900; Chest Tube:130] Intake/Output this shift:    General appearance: alert, cooperative and no distress Heart: regular rate and rhythm Lungs: coarse BS Abdomen: soft, nontender Extremities: no edema Wound: incis healing well  Lab Results:  Recent Labs  07/17/14 0500 07/18/14 0500  WBC 14.0* 10.1  HGB 12.2 12.3  HCT 40.2 40.2  PLT 192 197   BMET:  Recent Labs  07/17/14 0500 07/18/14 0500  NA 137 139  K 3.9 3.9  CL 104 99  CO2 25 31  GLUCOSE 140* 96  BUN <5* 6  CREATININE 0.77 0.73  CALCIUM 8.5 8.8    PT/INR: No results for input(s): LABPROT, INR in the last 72 hours. ABG    Component Value Date/Time   PHART 7.303* 07/17/2014 0302   HCO3 29.5* 07/17/2014 0302   TCO2 31.4 07/17/2014 0302   O2SAT 94.8 07/17/2014 0302   CBG (last 3)   Recent Labs  07/18/14 1739 07/18/14 2338 07/19/14 0519  GLUCAP 129* 130* 176*    Meds Scheduled Meds: . acetaminophen  1,000 mg Oral 4 times per day   Or  . acetaminophen (TYLENOL) oral liquid 160 mg/5 mL  1,000 mg Oral 4 times per day  . amLODipine  5 mg Oral Daily  . ARIPiprazole  25 mg Oral Daily  . bisacodyl  10 mg Oral Daily  . buPROPion  150 mg Oral BID    . enoxaparin (LOVENOX) injection  30 mg Subcutaneous Q24H  . fentaNYL   Intravenous 6 times per day  . furosemide  20 mg Oral Daily  . gabapentin  100 mg Oral TID AC  . gabapentin  400 mg Oral QHS  . insulin aspart  0-24 Units Subcutaneous 4 times per day  . pantoprazole  40 mg Oral Daily  . senna-docusate  1 tablet Oral QHS   Continuous Infusions: . dextrose 5 % and 0.9% NaCl 10 mL/hr at 07/17/14 1850   PRN Meds:.clotrimazole, diphenhydrAMINE **OR** diphenhydrAMINE, hydrALAZINE, levalbuterol, naloxone **AND** sodium chloride, ondansetron (ZOFRAN) IV, ondansetron (ZOFRAN) IV, potassium chloride, traMADol  Xrays Dg Chest Port 1 View  07/18/2014   CLINICAL DATA:  Status post bronchoscopy and left VATS on 07/16/2014 with wedge resection of left lung.  EXAM: PORTABLE CHEST - 1 VIEW  COMPARISON:  07/17/2014  FINDINGS: Central line positioning stable in the lower SVC. Left chest tube remains with no evidence of pneumothorax. Aeration stable with mild bibasilar atelectasis present. No edema or focal airspace consolidation. The heart size is normal.  IMPRESSION: Stable bibasilar atelectasis.  No pneumothorax.   Electronically Signed   By: Aletta Edouard M.D.   On: 07/18/2014 08:12  Dg Chest Port 1v Same Day  07/18/2014   CLINICAL DATA:  Status post chest tube removal on left side.  EXAM: PORTABLE CHEST - 1 VIEW SAME DAY  COMPARISON:  July 17, 2014  FINDINGS: The chest tube on the left has been removed. No pneumothorax. There is atelectatic change in the left upper lobe. Elsewhere lungs are clear. Heart is mildly enlarged with pulmonary vascularity within normal limits. Central catheter tip is in the superior vena cava. No adenopathy. No bone lesions.  IMPRESSION: No pneumothorax following left chest tube removal. No change in central catheter position on the right. Left upper lobe atelectasis present. Elsewhere lungs clear. Heart prominent but stable.   Electronically Signed   By: Lowella Grip  M.D.   On: 07/18/2014 16:12    Assessment/Plan: S/P Procedure(s) (LRB): VIDEO BRONCHOSCOPY (N/A) VIDEO ASSISTED THORACOSCOPY (Left) LUNG BIOPSY (Left) WEDGE RESECTION (Left) Plan for discharge: see discharge orders BP elevated , will increase norvasc - knows she needs to follow-up with PMD in next few weeks for further eval of BP   LOS: 3 days    GOLD,WAYNE E 07/19/2014

## 2014-07-21 LAB — ANAEROBIC CULTURE

## 2014-07-25 ENCOUNTER — Inpatient Hospital Stay: Payer: Medicaid Other | Admitting: Pulmonary Disease

## 2014-07-25 ENCOUNTER — Encounter (HOSPITAL_COMMUNITY): Payer: Self-pay

## 2014-07-25 ENCOUNTER — Ambulatory Visit (INDEPENDENT_AMBULATORY_CARE_PROVIDER_SITE_OTHER): Payer: Self-pay

## 2014-07-25 DIAGNOSIS — Z4802 Encounter for removal of sutures: Secondary | ICD-10-CM

## 2014-07-25 DIAGNOSIS — J849 Interstitial pulmonary disease, unspecified: Secondary | ICD-10-CM

## 2014-07-25 NOTE — Progress Notes (Signed)
Removed 1 suture from chest tube incision site with no signs of infection and patient tolerated well.

## 2014-07-31 ENCOUNTER — Other Ambulatory Visit: Payer: Self-pay

## 2014-07-31 DIAGNOSIS — J849 Interstitial pulmonary disease, unspecified: Secondary | ICD-10-CM

## 2014-08-02 ENCOUNTER — Encounter (HOSPITAL_COMMUNITY): Payer: Self-pay

## 2014-08-02 ENCOUNTER — Ambulatory Visit: Payer: Medicaid Other | Admitting: Cardiothoracic Surgery

## 2014-08-02 ENCOUNTER — Telehealth: Payer: Self-pay | Admitting: *Deleted

## 2014-08-10 ENCOUNTER — Encounter: Payer: Self-pay | Admitting: Pulmonary Disease

## 2014-08-10 ENCOUNTER — Ambulatory Visit (INDEPENDENT_AMBULATORY_CARE_PROVIDER_SITE_OTHER): Payer: Medicaid Other | Admitting: Pulmonary Disease

## 2014-08-10 ENCOUNTER — Other Ambulatory Visit: Payer: Medicaid Other

## 2014-08-10 VITALS — BP 140/82 | HR 92 | Temp 97.6°F | Ht 64.0 in | Wt 251.0 lb

## 2014-08-10 DIAGNOSIS — J84115 Respiratory bronchiolitis interstitial lung disease: Secondary | ICD-10-CM

## 2014-08-10 DIAGNOSIS — J9611 Chronic respiratory failure with hypoxia: Secondary | ICD-10-CM

## 2014-08-10 LAB — FUNGUS CULTURE W SMEAR: Fungal Smear: NONE SEEN

## 2014-08-10 LAB — RHEUMATOID FACTOR: Rhuematoid fact SerPl-aCnc: <10 IU/mL (ref <=14)

## 2014-08-10 NOTE — Patient Instructions (Signed)
Will change qvar to symbicort 160.  Take 2 puffs with spacer EVERY AM AND PM no matter what. Will check bloodwork today as we discussed. You must stay completely away from smoking Think about the exercise class at [facility] and I can refer you to them.  Work on weight loss followup with me again in 51mo.

## 2014-08-10 NOTE — Progress Notes (Signed)
   Subjective:    Patient ID: Diane Gilbert, female    DOB: April 16, 1966, 49 y.o.   MRN: [id number]  HPI The patient comes in today for follow-up after her recent hospitalization for her thoracoscopic lung biopsy. This was over read at [facility], and felt to be consistent with RB ILD. They also felt that a component of NSIP was possibly in the differential. The patient has had blood work done for possible lupus, and this is unremarkable. She will need other testing for autoimmune disease given the question of NSIP.  She has had a hypersensitivity pneumonitis panel that was unremarkable. Unfortunately, the patient has not smoked since her surgery, and feels that she is going to be able to stay away from cigarettes. She still has significant dyspnea on exertion, but I reminded her that her weight and deconditioning or part of this as well.   Review of Systems  Constitutional: Negative for fever and unexpected weight change.  HENT: Negative for congestion, dental problem, ear pain, nosebleeds, postnasal drip, rhinorrhea, sinus pressure, sneezing, sore throat and trouble swallowing.   Eyes: Negative for redness and itching.  Respiratory: Positive for cough and shortness of breath. Negative for chest tightness and wheezing.   Cardiovascular: Negative for palpitations and leg swelling.  Gastrointestinal: Negative for nausea and vomiting.  Genitourinary: Negative for dysuria.  Musculoskeletal: Negative for joint swelling.  Skin: Negative for rash.  Neurological: Negative for headaches.  Hematological: Does not bruise/bleed easily.  Psychiatric/Behavioral: Negative for dysphoric mood. The patient is not nervous/anxious.        Objective:   Physical Exam Obese female in no acute distress Nose without purulence or discharge noted Neck without lymphadenopathy or thyromegaly Chest with scattered crackles, decreased depth of inspiration, no active wheezing Cardiac exam with regular rate and  rhythm Lower extremities with mild edema, no cyanosis Alert and oriented, moves all 4 extremities.       Assessment & Plan:

## 2014-08-10 NOTE — Assessment & Plan Note (Signed)
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 The over read at [facility] questioned whether there was a component of NSIP in the differential? The patient's history is most consistent with the former, and I have explained to the patient there is no specific treatment except total smoking cessation. She has been on a trial of prednisone which really did not seem to make any difference in her breathing. She tells me that she has been off cigarettes since her surgery, and is still having significant dyspnea on exertion. I will change her Qvar to Symbicort, but more than likely what is going to help her breathing the most is aggressive weight loss and a conditioning program. Also, continuing on her smoking cessation program.

## 2014-08-13 LAB — FUNGUS CULTURE W SMEAR: Fungal Smear: NONE SEEN

## 2014-08-13 LAB — CYCLIC CITRUL PEPTIDE ANTIBODY, IGG: Cyclic Citrullin Peptide Ab: <2 U/mL (ref 0.0–5.0)

## 2014-08-13 LAB — SJOGRENS SYNDROME-A EXTRACTABLE NUCLEAR ANTIBODY: SSA (Ro) (ENA) Antibody, IgG: <1

## 2014-08-13 LAB — ANTI-SCLERODERMA ANTIBODY: SCLERODERMA (SCL-70) (ENA) ANTIBODY, IGG: NEGATIVE

## 2014-08-13 LAB — SJOGRENS SYNDROME-B EXTRACTABLE NUCLEAR ANTIBODY: SSB (La) (ENA) Antibody, IgG: <1

## 2014-08-28 LAB — AFB CULTURE WITH SMEAR (NOT AT ARMC)
Acid Fast Smear: NONE SEEN
Acid Fast Smear: NONE SEEN

## 2014-09-03 ENCOUNTER — Other Ambulatory Visit: Payer: Self-pay | Admitting: Cardiothoracic Surgery

## 2014-09-03 DIAGNOSIS — J849 Interstitial pulmonary disease, unspecified: Secondary | ICD-10-CM

## 2014-09-06 ENCOUNTER — Ambulatory Visit (INDEPENDENT_AMBULATORY_CARE_PROVIDER_SITE_OTHER): Payer: Self-pay | Admitting: Cardiothoracic Surgery

## 2014-09-06 ENCOUNTER — Encounter: Payer: Self-pay | Admitting: Cardiothoracic Surgery

## 2014-09-06 ENCOUNTER — Ambulatory Visit
Admission: RE | Admit: 2014-09-06 | Discharge: 2014-09-06 | Disposition: A | Discharge status: Home / Self Care | Payer: Medicaid Other | Source: Ambulatory Visit | Attending: Cardiothoracic Surgery | Admitting: Cardiothoracic Surgery

## 2014-09-06 VITALS — BP 132/75 | HR 87 | Resp 20 | Ht 64.0 in | Wt 249.0 lb

## 2014-09-06 DIAGNOSIS — J849 Interstitial pulmonary disease, unspecified: Secondary | ICD-10-CM

## 2014-09-06 NOTE — Progress Notes (Signed)
[address]       [place] [address]             [phone]                    Diane Gilbert [place] Medical Record #[id number] Date of Birth: 1965-12-16  Referring: Kathee Delton, MD Primary Care: Philis Fendt, MD  Chief Complaint:    Chief Complaint  Patient presents with  . Routine Post Op    f/u from surgery with CXR, s/p LT VATS, wedge resection of the lung x2, left upper lobe and left lower lobe.07/16/2014  07/16/2014 OPERATIVE REPOR PREOPERATIVE DIAGNOSIS: Question of interstitial lung disease. POSTOPERATIVE DIAGNOSIS: Question of interstitial lung disease. Final pathology pending. SURGICAL PROCEDURE: Bronchoscopy, left video-assisted thoracoscopy with wedge resection of the lung x2, left upper lobe and left lower lobe. SURGEON: Lanelle Bal, MD  History of Present Illness:    Diane Gilbert 49 y.o. female is seen in the office  today  for follow-up visit after thorascopic lung biopsy two months ago. This was over read at [facility], and felt to be consistent with RB ILD. They also felt that a component of NSIP was possibly in the differential. The patient has had blood work done for possible lupus, and this is unremarkable.She  has not smoked since her surgery, and feels that she is going to be able to stay away from cigarettes. She still has significant dyspnea on exertion.  Current Activity/ Functional Status:  Patient is independent with mobility/ambulation, transfers, ADL's, IADL's.  patient currently lives alone  Zubrod Score: At the time of surgery this patient's most appropriate activity status/level should be described as: _0     0    Normal activity, no symptoms _1     1    Restricted in physical strenuous activity but ambulatory, able to do out light work _2     2    Ambulatory and capable of self care, unable to do work activities, up and about               >50 % of waking hours                               _3     3    Only limited self care, in bed greater than 50% of waking hours _4     4    Completely disabled, no self care, confined to bed or chair _5     5    Moribund   Past Medical History  Diagnosis Date  . Alcohol abuse   . Hypertension   . Insomnia   . Anxiety   . Depression   . Headache(784.0)   . Arthritis   . Interstitial lung disease   . Oxygen dependent   . Sleep apnea   . Shortness of breath dyspnea   . Schizophrenia     Past Surgical History  Procedure Laterality Date  . Back surgery    . Video bronchoscopy Bilateral 04/11/2014    Procedure: VIDEO BRONCHOSCOPY WITH FLUORO;  Surgeon: Kathee Delton, MD;  Location: [facility];  Service: Cardiopulmonary;  Laterality: Bilateral;  . Multiple tooth extractions    . Video bronchoscopy N/A 07/16/2014    Procedure: VIDEO BRONCHOSCOPY;  Surgeon: Grace Isaac, MD;  Location: [place];  Service: Thoracic;  Laterality: N/A;  . Video assisted thoracoscopy Left 07/16/2014  Procedure: VIDEO ASSISTED THORACOSCOPY;  Surgeon: Grace Isaac, MD;  Location: [place];  Service: Thoracic;  Laterality: Left;  . Lung biopsy Left 07/16/2014    Procedure: LUNG BIOPSY;  Surgeon: Grace Isaac, MD;  Location: [place];  Service: Thoracic;  Laterality: Left;  . Wedge resection Left 07/16/2014    Procedure: WEDGE RESECTION;  Surgeon: Grace Isaac, MD;  [facility];  Service: Thoracic;  Laterality: Left;  left upper lobe lung resection    Family History  Problem Relation Age of Onset  . COPD Maternal Aunt   . Breast cancer Mother    patient has 4 brothers one deceased from trauma and 2 sisters none have had any pulmonary problems, both her mother and father alive she does not know any of the health history of her father she has no children  History   Social History  . Marital Status: Single    Spouse Name: N/A  . Number of Children: N/A  . Years of Education: N/A   Occupational History  . unemployed    Social History  Main Topics  . Smoking status: Former Smoker -- 0.00 packs/day for 30 years    Types: Cigarettes    Quit date: 07/18/2014  . Smokeless tobacco: Never Used  . Alcohol Use: 0.0 oz/week    0 Standard drinks or equivalent per week     Comment: Pt has previously abused alcohol , pt states she still drinks "every now and then"  . Drug Use: No  . Sexual Activity: Not Currently     Comment: husband locked up    Other Topics Concern  . Not on file   Social History Narrative    History  Smoking status  . Former Smoker -- 0.00 packs/day for 30 years  . Types: Cigarettes  . Quit date: 07/18/2014  Smokeless tobacco  . Never Used    History  Alcohol Use  . 0.0 oz/week  . 0 Standard drinks or equivalent per week    Comment: Pt has previously abused alcohol , pt states she still drinks "every now and then"     No Known Allergies  Current Outpatient Prescriptions  Medication Sig Dispense Refill  . albuterol (PROVENTIL HFA;VENTOLIN HFA) 108 (90 BASE) MCG/ACT inhaler Inhale 2 puffs into the lungs every 6 (six) hours as needed for wheezing or shortness of breath. 1 Inhaler 3  . amLODipine (NORVASC) 10 MG tablet Take 1 tablet (10 mg total) by mouth daily. (Patient taking differently: Take 20 mg by mouth daily. ) 30 tablet 1  . ARIPiprazole (ABILIFY) 5 MG tablet Take 25 mg by mouth daily.     . beclomethasone (QVAR) 80 MCG/ACT inhaler Inhale 2 puffs into the lungs 2 (two) times daily.    . budesonide-formoterol (SYMBICORT) 160-4.5 MCG/ACT inhaler Inhale 2 puffs into the lungs 2 (two) times daily.    Marland Kitchen buPROPion (WELLBUTRIN SR) 150 MG 12 hr tablet Take 150 mg by mouth 2 (two) times daily.    . clotrimazole (LOTRIMIN) 1 % cream Apply 1 application topically 2 (two) times daily.    . furosemide (LASIX) 20 MG tablet Take 20 mg by mouth daily.   0  . gabapentin (NEURONTIN) 100 MG capsule Take 100-400 mg by mouth 4 (four) times daily. Takes 100 mg 3 times daily and 400 mg at bedtime    .  oxyCODONE-acetaminophen (PERCOCET/ROXICET) 5-325 MG per tablet Take 1 tablet by mouth every 4 (four) hours as needed for moderate pain. As  needed 50 tablet 0  . QUEtiapine (SEROQUEL) 100 MG tablet Take 100 mg by mouth at bedtime.    Marland Kitchen tiZANidine (ZANAFLEX) 4 MG tablet Take 4 mg by mouth 2 (two) times daily as needed for muscle spasms.    . traZODone (DESYREL) 150 MG tablet Take 150 mg by mouth at bedtime.     No current facility-administered medications for this visit.     Review of Systems:     Cardiac Review of Systems: Y or N  Chest Pain [ n   ]  Resting SOB [  y ] Exertional SOB  [ y ]  Orthopnea [ y ]   Pedal Edema [ marked  ]    Palpitations [n  ] Syncope  [n  ]   Presyncope [ n  ]  General Review of Systems: [Y] = yes [  ]=no Constitional: recent weight change [ n ];  Wt loss over the last 3 months [   ] anorexia [  ]; fatigue [  ]; nausea [  ]; night sweats [  ]; fever [  ]; or chills [  ];          Dental: poor dentition[  ]; Last Dentist visit:   Eye : blurred vision [  ]; diplopia [   ]; vision changes [  ];  Amaurosis fugax[  ]; Resp: cough [ y ];  wheezing[y  ];  hemoptysis[ n ]; shortness of breath[ y ]; paroxysmal nocturnal dyspnea[ y ]; dyspnea on exertion[ y ]; or orthopnea[ y ];  GI:  gallstones[  ], vomiting[  ];  dysphagia[  ]; melena[  ];  hematochezia [  n]; heartburn[  ];   Hx of  Colonoscopy[  ]; GU: kidney stones [  ]; hematuria[  ];   dysuria [  ];  nocturia[  ];  history of     obstruction [  ]; urinary frequency [  ]             Skin: rash, swelling[  ];, hair loss[  ];  peripheral edema[  ];  or itching[  ]; Musculosketetal: myalgias[  ];  joint swelling[  ];  joint erythema[  ];  joint pain[  ];  back pain[  ];  Heme/Lymph: bruising[  ];  bleeding[  ];  anemia[  ];  Neuro: TIA[  ];  headaches[  ];  stroke[ n ];  vertigo[  ];  seizures[  ];   paresthesias[  ];  difficulty walking[ y ];  Psych:depression[y  ]; anxiety[ y ];  Endocrine: diabetes[ n ];  thyroid  dysfunction[n  ];  Immunizations: Flu up to date [ y ]; Pneumococcal up to date [n  ];  Other:  Physical Exam: BP 132/75 mmHg  Pulse 87  Resp 20  Ht _0  (1.626 m)  Wt 249 lb (112.946 kg)  BMI 42.72 kg/m2  SpO2 93%  LMP 08/30/2014  PHYSICAL EXAMINATION:  General appearance: alert, cooperative, appears older than stated age, mild distress, moderately obese and slowed mentation Neurologic: intact Heart: regular rate and rhythm, S1, S2 normal, no murmur, click, rub or gallop Lungs: Bilateral crackles, without active wheezing Abdomen: soft, non-tender; bowel sounds normal; no masses,  no organomegaly Extremities: edema 2+ pitting edema both ankles Patient has no carotid bruits As noted she has an erythematous rash across cheeks  Diagnostic Studies & Laboratory data:     Recent Radiology Findings:  Dg Chest 2 View  09/06/2014  CLINICAL DATA:  Interstitial lung disease.  Shortness of breath.  EXAM: CHEST  2 VIEW  COMPARISON:  07/19/2014, 07/18/2014, 07/13/2014, 05/10/2014. Chest CT 01/22/2014.  FINDINGS: Mediastinum hilar structures are stable. Stable cardiomegaly with normal pulmonary vascularity. Stable diffuse bilateral from interstitial prominence consistent with chronic interstitial lung disease. Mild left base subsegmental atelectasis. Stable density right pulmonary apex was previously identified to represent healed rib fracture. No pulmonary nodule node in this region on recent chest CT. No pleural effusion or pneumothorax. No acute bony abnormality.  IMPRESSION: 1. Stable changes of chronic interstitial lung disease. 2. Left base subsegmental atelectasis. 3. Stable cardiomegaly.   Electronically Signed   By: Marcello Moores  Register   On: 09/06/2014 12:56   CLINICAL DATA: Pulmonary nodule. Abnormal chest radiograph. Question interstitial lung disease.  EXAM: CT CHEST WITHOUT CONTRAST  TECHNIQUE: Multidetector CT imaging of the chest was performed following the standard protocol  without intravenous contrast. High resolution imaging of the lungs, as well as inspiratory and expiratory imaging, was performed.  COMPARISON: Chest radiographs 01/10/2014 and 10/19/2013.  FINDINGS: No pathologically enlarged mediastinal or axillary lymph nodes. Hilar regions are difficult to definitively evaluate without IV contrast but appear grossly unremarkable. Heart size normal. No pericardial effusion. There may be a tiny hiatal hernia.  Image quality is degraded by respiratory motion. A somewhat upper lobe predominant pattern of ground-glass and centrilobular nodularity is seen superimposed on mild centrilobular emphysema. Scattered linear pulmonary parenchymal scarring. No pulmonary nodule. No traction bronchiectasis/ bronchiolectasis, architectural distortion or honeycombing. No subpleural reticulation. There may be mild air trapping. No pleural fluid. Airway is unremarkable.  Incidental imaging of the upper abdomen shows the visualized portions of the liver, adrenal glands, spleen, pancreas and stomach to be grossly unremarkable. No upper abdominal adenopathy. No worrisome lytic or sclerotic lesions. A healed anterior right first rib fracture accounts for the questioned abnormality on recent chest radiographs.  IMPRESSION: 1. 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 Subacute hypersensitivity pneumonitis is not excluded. 2. A healed anterior right first rib fracture accounts for the questioned abnormality on recent chest radiographs.   Electronically Signed  By: Lorin Picket M.D.  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  Grace Isaac MD      [address] [place] [address] Office [phone]   Beeper [phone]  09/06/2014 1:08 PM

## 2014-09-07 ENCOUNTER — Telehealth: Payer: Self-pay

## 2014-09-07 NOTE — Telephone Encounter (Signed)
Faxed referral to Pulmonary Rehab [phone] DX. ILD/ Pulmonary Fibrosis per Dr Servando Snare

## 2014-09-10 ENCOUNTER — Telehealth (HOSPITAL_COMMUNITY): Payer: Self-pay

## 2014-09-10 NOTE — Telephone Encounter (Signed)
Called patient to enquire about her participation in pulmonary. Unfortunately patients insurance will not pay for the program. Offered her maintenance but patient cannot afford the cost of the maintenance program.

## 2014-09-21 ENCOUNTER — Other Ambulatory Visit: Payer: Self-pay

## 2014-09-21 DIAGNOSIS — Z1231 Encounter for screening mammogram for malignant neoplasm of breast: Secondary | ICD-10-CM

## 2014-10-18 ENCOUNTER — Other Ambulatory Visit: Payer: Self-pay | Admitting: Surgical

## 2014-11-05 ENCOUNTER — Ambulatory Visit: Payer: Medicaid Other

## 2014-11-20 ENCOUNTER — Ambulatory Visit (INDEPENDENT_AMBULATORY_CARE_PROVIDER_SITE_OTHER): Payer: Medicaid Other | Admitting: Certified Nurse Midwife

## 2014-11-20 ENCOUNTER — Encounter: Payer: Self-pay | Admitting: Certified Nurse Midwife

## 2014-11-20 VITALS — BP 136/86 | HR 91 | Temp 98.3°F | Ht 64.0 in | Wt 238.0 lb

## 2014-11-20 DIAGNOSIS — Z113 Encounter for screening for infections with a predominantly sexual mode of transmission: Secondary | ICD-10-CM | POA: Diagnosis not present

## 2014-11-20 DIAGNOSIS — Z0000 Encounter for general adult medical examination without abnormal findings: Secondary | ICD-10-CM | POA: Diagnosis not present

## 2014-11-20 DIAGNOSIS — E669 Obesity, unspecified: Secondary | ICD-10-CM

## 2014-11-20 DIAGNOSIS — Z01419 Encounter for gynecological examination (general) (routine) without abnormal findings: Secondary | ICD-10-CM

## 2014-11-20 NOTE — Progress Notes (Signed)
Patient ID: Diane Gilbert, female   DOB: [birth year], 49 y.o.   MRN: [id number]    Subjective:      Diane Gilbert is a 49 y.o. female here for a routine exam.  Current complaints: none.  Consumes 2 40oz drinks/day.  On oxygen, 2 recent lung biopsies in January.  Maternal aunt & mother had BCA, dx at 51.  Currently on menses, lasting 5 days, irregular cycles.  Does not see anyone for counseling.  Irregularly sexually active. Desires STD screening.    Personal health questionnaire:  Is patient [name], have a family history of breast and/or ovarian cancer: yes Is there a family history of uterine cancer diagnosed at age < 45, gastrointestinal cancer, urinary tract cancer, family member who is a Field seismologist syndrome-associated carrier: no Is the patient overweight and hypertensive, family history of diabetes, personal history of gestational diabetes, preeclampsia or PCOS: yes Is patient over 71, have PCOS,  family history of premature CHD under age 90, diabetes, smoke, have hypertension or peripheral artery disease:  yes At any time, has a partner hit, kicked or otherwise hurt or frightened you?: no Over the past 2 weeks, have you felt down, depressed or hopeless?: yes Over the past 2 weeks, have you felt little interest or pleasure in doing things?:yes   Gynecologic History Patient's last menstrual period was 10/26/2014. Contraception: none Last Pap: 2014. Results were: normal Last mammogram: 10/31/2013. Results were: normal  Obstetric History OB History  Gravida Para Term Preterm AB SAB TAB Ectopic Multiple Living  _0        Past Medical History  Diagnosis Date  . Alcohol abuse   . Hypertension   . Insomnia   . Anxiety   . Depression   . Headache(784.0)   . Arthritis   . Interstitial lung disease   . Oxygen dependent   . Sleep apnea   . Shortness of breath dyspnea   . Schizophrenia     Past Surgical History  Procedure Laterality Date  .  Back surgery    . Video bronchoscopy Bilateral 04/11/2014    Procedure: VIDEO BRONCHOSCOPY WITH FLUORO;  Surgeon: Kathee Delton, MD;  Location: [facility];  Service: Cardiopulmonary;  Laterality: Bilateral;  . Multiple tooth extractions    . Video bronchoscopy N/A 07/16/2014    Procedure: VIDEO BRONCHOSCOPY;  Surgeon: Grace Isaac, MD;  Location: [place];  Service: Thoracic;  Laterality: N/A;  . Video assisted thoracoscopy Left 07/16/2014    Procedure: VIDEO ASSISTED THORACOSCOPY;  Surgeon: Grace Isaac, MD;  Location: [place];  Service: Thoracic;  Laterality: Left;  . Lung biopsy Left 07/16/2014    Procedure: LUNG BIOPSY;  Surgeon: Grace Isaac, MD;  Location: [place];  Service: Thoracic;  Laterality: Left;  . Wedge resection Left 07/16/2014    Procedure: WEDGE RESECTION;  Surgeon: Grace Isaac, MD;  Location: [facility] OR;  Service: Thoracic;  Laterality: Left;  left upper lobe lung resection     Current outpatient prescriptions:  .  albuterol (PROVENTIL HFA;VENTOLIN HFA) 108 (90 BASE) MCG/ACT inhaler, Inhale 2 puffs into the lungs every 6 (six) hours as needed for wheezing or shortness of breath., Disp: 1 Inhaler, Rfl: 3 .  amLODipine (NORVASC) 10 MG tablet, Take 1 tablet (10 mg total) by mouth daily. (Patient taking differently: Take 20 mg by mouth daily. ), Disp: 30 tablet, Rfl: 1 .  ARIPiprazole (ABILIFY) 5 MG tablet, Take 25  mg by mouth daily. , Disp: , Rfl:  .  beclomethasone (QVAR) 80 MCG/ACT inhaler, Inhale 2 puffs into the lungs 2 (two) times daily., Disp: , Rfl:  .  budesonide-formoterol (SYMBICORT) 160-4.5 MCG/ACT inhaler, Inhale 2 puffs into the lungs 2 (two) times daily., Disp: , Rfl:  .  buPROPion (WELLBUTRIN SR) 150 MG 12 hr tablet, Take 150 mg by mouth 2 (two) times daily., Disp: , Rfl:  .  clotrimazole (LOTRIMIN) 1 % cream, Apply 1 application topically 2 (two) times daily., Disp: , Rfl:  .  furosemide (LASIX) 20 MG tablet, Take 20 mg by mouth daily. , Disp: , Rfl: 0 .   gabapentin (NEURONTIN) 100 MG capsule, Take 100-400 mg by mouth 4 (four) times daily. Takes 100 mg 3 times daily and 400 mg at bedtime, Disp: , Rfl:  .  oxyCODONE-acetaminophen (PERCOCET/ROXICET) 5-325 MG per tablet, Take 1 tablet by mouth every 4 (four) hours as needed for moderate pain. As needed, Disp: 50 tablet, Rfl: 0 .  QUEtiapine (SEROQUEL) 100 MG tablet, Take 100 mg by mouth at bedtime., Disp: , Rfl:  .  tiZANidine (ZANAFLEX) 4 MG tablet, Take 4 mg by mouth 2 (two) times daily as needed for muscle spasms., Disp: , Rfl:  .  traZODone (DESYREL) 150 MG tablet, Take 150 mg by mouth at bedtime., Disp: , Rfl:  No Known Allergies  History  Substance Use Topics  . Smoking status: Former Smoker -- 0.00 packs/day for 30 years    Types: Cigarettes    Quit date: 07/18/2014  . Smokeless tobacco: Never Used     Comment: occasional  . Alcohol Use: 0.0 oz/week    0 Standard drinks or equivalent per week     Comment: Pt has previously abused alcohol , pt states she still drinks "every now and then"    Family History  Problem Relation Age of Onset  . COPD Maternal Aunt   . Breast cancer Mother       Review of Systems  Constitutional: negative for fatigue and weight loss Respiratory: negative for cough and wheezing Cardiovascular: negative for chest pain, fatigue and palpitations Gastrointestinal: negative for abdominal pain and change in bowel habits Musculoskeletal:negative for myalgias Neurological: negative for gait problems and tremors Behavioral/Psych: negative for abusive relationship, depression Endocrine: negative for temperature intolerance   Genitourinary:negative for abnormal menstrual periods, genital lesions, hot flashes, sexual problems and vaginal discharge Integument/breast: negative for breast lump, breast tenderness, nipple discharge and skin lesion(s)    Objective:       BP 136/86 mmHg  Pulse 91  Temp(Src) 98.3 F (36.8 C)  Ht _0  (1.626 m)  Wt 238 lb  (107.956 kg)  BMI 40.83 kg/m2  LMP 10/26/2014 General:   alert  Skin:   no rash or abnormalities  Lungs:   clear to auscultation bilaterally  Heart:   regular rate and rhythm, S1, S2 normal, no murmur, click, rub or gallop  Breasts:   normal without suspicious masses, skin or nipple changes or axillary nodes  Abdomen:  normal findings: no organomegaly, soft, non-tender and no hernia obese  Pelvis:  External genitalia: normal general appearance Urinary system: urethral meatus normal and bladder without fullness, nontender Vaginal: normal without tenderness, induration or masses Cervix: normal appearance Adnexa: normal bimanual exam Uterus: anteverted and non-tender, normal size difficult to assess d/t body habitus   Lab Review Urine pregnancy test Labs reviewed yes Radiologic studies reviewed yes  50% of 30 min visit spent on counseling and  coordination of care.   Assessment:    Healthy female exam.    Plan:    Education reviewed: depression evaluation, low fat, low cholesterol diet, safe sex/STD prevention, self breast exams, skin cancer screening and weight bearing exercise. Contraception: none. Follow up in: 1 year.   No orders of the defined types were placed in this encounter.   Orders Placed This Encounter  Procedures  . SureSwab, Vaginosis/Vaginitis Plus  . HIV antibody (with reflex)  . Hepatitis B surface antigen  . RPR  . Hepatitis C antibody  . Referral to Nutrition and Diabetes Services    Referral Priority:  Routine    Referral Type:  Consultation    Referral Reason:  Specialty Services Required    Number of Visits Requested:  1

## 2014-11-21 LAB — PAP IG AND HPV HIGH-RISK: HPV DNA High Risk: NOT DETECTED

## 2014-11-22 ENCOUNTER — Ambulatory Visit
Admission: RE | Admit: 2014-11-22 | Discharge: 2014-11-22 | Disposition: A | Discharge status: Home / Self Care | Payer: Medicaid Other | Source: Ambulatory Visit

## 2014-11-22 DIAGNOSIS — Z1231 Encounter for screening mammogram for malignant neoplasm of breast: Secondary | ICD-10-CM

## 2014-11-22 LAB — SURESWAB, VAGINOSIS/VAGINITIS PLUS
Atopobium vaginae: 6.5 Log (cells/mL)
C. ALBICANS, DNA: DETECTED — AB
C. PARAPSILOSIS, DNA: NOT DETECTED
C. glabrata, DNA: NOT DETECTED
C. trachomatis RNA, TMA: NOT DETECTED
C. tropicalis, DNA: NOT DETECTED
Gardnerella vaginalis: 7.8 Log (cells/mL)
LACTOBACILLUS SPECIES: NOT DETECTED Log (cells/mL)
MEGASPHAERA SPECIES: 8 Log (cells/mL)
N. gonorrhoeae RNA, TMA: NOT DETECTED
T. vaginalis RNA, QL TMA: NOT DETECTED

## 2014-11-23 ENCOUNTER — Other Ambulatory Visit: Payer: Self-pay | Admitting: Certified Nurse Midwife

## 2014-11-23 DIAGNOSIS — N760 Acute vaginitis: Principal | ICD-10-CM

## 2014-11-23 DIAGNOSIS — B3731 Acute candidiasis of vulva and vagina: Secondary | ICD-10-CM

## 2014-11-23 DIAGNOSIS — B9689 Other specified bacterial agents as the cause of diseases classified elsewhere: Secondary | ICD-10-CM

## 2014-11-23 DIAGNOSIS — B373 Candidiasis of vulva and vagina: Secondary | ICD-10-CM

## 2014-11-23 MED ORDER — TINIDAZOLE 500 MG PO TABS: 2.0000 g | ORAL_TABLET | Freq: Every day | ORAL | Status: AC

## 2014-11-23 MED ORDER — FLUCONAZOLE 100 MG PO TABS
100.0000 mg | ORAL_TABLET | Freq: Once | ORAL | Status: DC
Start: 2014-11-23 — End: 2015-09-06

## 2014-11-27 ENCOUNTER — Other Ambulatory Visit: Payer: Medicaid Other

## 2014-12-05 ENCOUNTER — Ambulatory Visit (INDEPENDENT_AMBULATORY_CARE_PROVIDER_SITE_OTHER): Payer: Medicaid Other | Admitting: Pulmonary Disease

## 2014-12-05 ENCOUNTER — Encounter: Payer: Self-pay | Admitting: Pulmonary Disease

## 2014-12-05 VITALS — BP 116/70 | HR 83 | Temp 98.4°F | Ht 64.0 in | Wt 242.4 lb

## 2014-12-05 DIAGNOSIS — J84115 Respiratory bronchiolitis interstitial lung disease: Secondary | ICD-10-CM | POA: Diagnosis not present

## 2014-12-05 DIAGNOSIS — J9611 Chronic respiratory failure with hypoxia: Secondary | ICD-10-CM

## 2014-12-05 MED ORDER — BUDESONIDE-FORMOTEROL FUMARATE 160-4.5 MCG/ACT IN AERO
2.0000 | INHALATION_SPRAY | Freq: Two times a day (BID) | RESPIRATORY_TRACT | Status: DC
Start: 2014-12-05 — End: 2015-05-09

## 2014-12-05 NOTE — Addendum Note (Signed)
Addended by: Inge Rise on: 12/05/2014 11:47 AM   Modules accepted: Orders

## 2014-12-05 NOTE — Assessment & Plan Note (Signed)
The patient feels that her breathing is at least stable from the last visit, but unfortunately continues to smoke an occasional cigarette. I have explained to her that she must stop smoking 100% in order to prevent progressive disease. I also stressed to her the importance of weight loss, and how it will improve her dyspnea and quality of life. Even though she did not have definite airflow obstruction on her spirometry, she did have air-trapping on her PFTs. She also had ongoing airway inflammation with acute asthmatic bronchitis related to her smoking.  Because of this, I tried her on Symbicort at the last visit, and she thinks this has helped her quite a bit. I would like for her to continue on this as well, but she probably does not need inhaled medication 1 she totally stopped smoking.

## 2014-12-05 NOTE — Patient Instructions (Signed)
Stay on symbicort, and can use albuterol for rescue if needed. Work on totally stopping smoking, and also weight reduction. followup with Dr. Lake Bells in 97mo.

## 2014-12-05 NOTE — Progress Notes (Signed)
   Subjective:    Patient ID: Diane Gilbert, female    DOB: [birth year], 49 y.o.   MRN: [id number]  HPI The patient comes in today for follow-up of her respiratory bronchiolitis with interstitial lung disease. She also has chronic respiratory failure that requires oxygen supplementation. I have been working with her on total smoking cessation, and although she has cut way back, she has not totally quit.  She has been maintained on Symbicort because of recurrent episodes of asthmatic bronchitis related to airway inflammation from smoking. She feels this has helped her breathing, and wishes to continue on the medication.   Review of Systems  Constitutional: Negative for fever and unexpected weight change.  HENT: Negative for congestion, dental problem, ear pain, nosebleeds, postnasal drip, rhinorrhea, sinus pressure, sneezing, sore throat and trouble swallowing.   Eyes: Negative for redness and itching.  Respiratory: Positive for cough and shortness of breath. Negative for chest tightness and wheezing.   Cardiovascular: Negative for palpitations and leg swelling.  Gastrointestinal: Negative for nausea and vomiting.  Genitourinary: Negative for dysuria.  Musculoskeletal: Negative for joint swelling.  Skin: Negative for rash.  Neurological: Negative for headaches.  Hematological: Does not bruise/bleed easily.  Psychiatric/Behavioral: Negative for dysphoric mood. The patient is not nervous/anxious.        Objective:   Physical Exam  Obese female in no acute distress Nose without purulence or discharge noted Neck without lymphadenopathy or thyromegaly Chest with decreased depth of inspiration, a few basilar crackles, no wheezing Heart exam with regular rate and rhythm Lower extremities with mild edema, no cyanosis Alert and oriented, moves all 4 extremities.      Assessment & Plan:

## 2014-12-11 ENCOUNTER — Telehealth: Payer: Self-pay | Admitting: Pulmonary Disease

## 2014-12-11 NOTE — Telephone Encounter (Signed)
Spoke with Caleb Popp - regarding Disability Forms States that she has faxed these forms multiple times and has confirmations that they should have been received by our office.  I advised her that I have spoken with Dr Janifer Adie former Nurse [facility] and she stated that they never received anything - no previous telephone notes prior  Gave her triage fax # to refax these forms - will await fax.  Spoke with Elmyra Ricks in Medical Records - they have not received anything either. Elmyra Ricks requested phone number to reach Chrys Racer, states that she is going to call her back as well about this.   Will hold in triage to follow up on.

## 2014-12-12 ENCOUNTER — Ambulatory Visit: Payer: Medicaid Other | Admitting: Pulmonary Disease

## 2014-12-12 NOTE — Telephone Encounter (Signed)
Forms received. Will give to JJ to have TP review to se if we can fill out forms.

## 2014-12-14 NOTE — Telephone Encounter (Signed)
Keane Scrape - has this been done yet?  Please advise.

## 2014-12-14 NOTE — Telephone Encounter (Signed)
Forms received by [facility] - not yet signed by TP (she is out of the office today 6/17) Unsure if this can be done without an ov as it contains some difficult quesitons - pt recently seen on 6/8 by [facility]  Will forward message to TP to see if we can safely fill out forms (in TP's lookat) Please advise, thank you.

## 2014-12-17 NOTE — Telephone Encounter (Signed)
Per TP: last seen 12/05/14 - last PFT 03/2014.  Unfortunately, pt will need ov to fill out forms.  Thanks.

## 2014-12-18 NOTE — Telephone Encounter (Signed)
ATC x 3 - Unable to leave message - [facility]

## 2014-12-18 NOTE — Telephone Encounter (Signed)
LMTCB x1 for pt.  

## 2014-12-19 NOTE — Telephone Encounter (Signed)
Patient scheduled to see TP on Friday 12/21/2014 at 11:15. Nothing further needed.

## 2014-12-21 ENCOUNTER — Ambulatory Visit (INDEPENDENT_AMBULATORY_CARE_PROVIDER_SITE_OTHER): Payer: Medicaid Other | Admitting: Adult Health

## 2014-12-21 ENCOUNTER — Encounter: Payer: Self-pay | Admitting: Adult Health

## 2014-12-21 VITALS — BP 124/82 | HR 84 | Temp 98.4°F | Ht 64.0 in | Wt 242.0 lb

## 2014-12-21 DIAGNOSIS — J84115 Respiratory bronchiolitis interstitial lung disease: Secondary | ICD-10-CM

## 2014-12-21 NOTE — Assessment & Plan Note (Signed)
Respiratory bronchiolitis with interstitial lung disease confirmed by lung biopsy. Autoimmune labs were normal in February 2016. Patient has chronic shortness of breath with chronic hypoxic respiratory failure requiring continuous oxygen at 2 L, Patient has been unable to work due to her shortness of breath. Disability papers were completed today She isto  follow-up in our office in 4 months and as needed Encouraged on smoking cessation

## 2014-12-21 NOTE — Patient Instructions (Signed)
Must quit smoking  Disability papers completed.  Continue on current regimen  Follow up with .Dr. Lake Bells in 4 months and As needed

## 2014-12-21 NOTE — Progress Notes (Signed)
Subjective:    Patient ID: Diane Gilbert, female    DOB: 25-Mar-1966, 49 y.o.   MRN: [id number]  HPI 49 year old female with respiratory bronchiolitis with interstitial lung disease with ongoing smoking. Has chronic hypoxic respiratory failure requiring oxygen at 2 L continuous oxygen.  12/21/2014 follow-up for disability papers. Patient presents for a follow-up visit Patient is currently applying for disability due to inability to work. She has chronic shortness of breath and is on oxygen at 2 L continuously. She has been diagnosed with respiratory bronchiolitis with interstitial lung disease. Has undergone a lung biopsy to confirm diagnosis. He is been advised on smoking cessation. She does get winded with minimal activity. She also has back pain and anxiety. She finds it very difficulty to lift anything over 10 pounds. She has to rest after a few minutes of standing and walking. She gets short of breath with any type of incline or prolonged walking. Disability papers were filled out in full.   Review of Systems  Constitutional:   No  weight loss, night sweats,  Fevers, chills, + fatigue, or  lassitude.  HEENT:   No headaches,  Difficulty swallowing,  Tooth/dental problems, or  Sore throat,                No sneezing, itching, ear ache, nasal congestion, post nasal drip,   CV:  No chest pain,  Orthopnea, PND, swelling in lower extremities, anasarca, dizziness, palpitations, syncope.   GI  No heartburn, indigestion, abdominal pain, nausea, vomiting, diarrhea, change in bowel habits, loss of appetite, bloody stools.   Resp:    No chest wall deformity  Skin: no rash or lesions.  GU: no dysuria, change in color of urine, no urgency or frequency.  No flank pain, no hematuria   MS:  No joint pain or swelling.  No decreased range of motion.  + back pain.  Psych:  No change in mood or affect. No depression or anxiety.  No memory loss.         Objective:   Physical  Exam GEN: A/Ox3; pleasant , NAD, obese   HEENT:  [place]/AT,  EACs-clear, TMs-wnl, NOSE-clear, THROAT-clear, no lesions, no postnasal drip or exudate noted.   NECK:  Supple w/ fair ROM; no JVD; normal carotid impulses w/o bruits; no thyromegaly or nodules palpated; no lymphadenopathy.  RESP  Decreased BS in bases no accessory muscle use, no dullness to percussion  CARD:  RRR, no m/r/g  , tr peripheral edema, pulses intact, no cyanosis or clubbing.  GI:   Soft & nt; nml bowel sounds; no organomegaly or masses detected.  Musco: Warm bil, no deformities or joint swelling noted.   Neuro: alert, no focal deficits noted.    Skin: Warm, no lesions or rashes         Assessment & Plan:

## 2014-12-24 NOTE — Progress Notes (Signed)
I agree with this assessment and plan

## 2015-05-09 ENCOUNTER — Ambulatory Visit (INDEPENDENT_AMBULATORY_CARE_PROVIDER_SITE_OTHER)
Admission: RE | Admit: 2015-05-09 | Discharge: 2015-05-09 | Disposition: A | Discharge status: Home / Self Care | Payer: Medicaid Other | Source: Ambulatory Visit | Attending: Pulmonary Disease | Admitting: Pulmonary Disease

## 2015-05-09 ENCOUNTER — Ambulatory Visit (INDEPENDENT_AMBULATORY_CARE_PROVIDER_SITE_OTHER): Payer: Medicaid Other | Admitting: Pulmonary Disease

## 2015-05-09 ENCOUNTER — Encounter: Payer: Self-pay | Admitting: Pulmonary Disease

## 2015-05-09 VITALS — BP 138/80 | HR 89 | Ht 64.0 in | Wt 237.0 lb

## 2015-05-09 DIAGNOSIS — R059 Cough, unspecified: Secondary | ICD-10-CM

## 2015-05-09 DIAGNOSIS — R05 Cough: Secondary | ICD-10-CM | POA: Diagnosis not present

## 2015-05-09 DIAGNOSIS — J84115 Respiratory bronchiolitis interstitial lung disease: Secondary | ICD-10-CM

## 2015-05-09 MED ORDER — DOXYCYCLINE HYCLATE 100 MG PO TABS: 100.0000 mg | ORAL_TABLET | Freq: Two times a day (BID) | ORAL | Status: DC

## 2015-05-09 MED ORDER — ALBUTEROL SULFATE HFA 108 (90 BASE) MCG/ACT IN AERS: 2.0000 | INHALATION_SPRAY | Freq: Four times a day (QID) | RESPIRATORY_TRACT | Status: DC | PRN

## 2015-05-09 MED ORDER — BUDESONIDE-FORMOTEROL FUMARATE 160-4.5 MCG/ACT IN AERO: 2.0000 | INHALATION_SPRAY | Freq: Two times a day (BID) | RESPIRATORY_TRACT | Status: DC

## 2015-05-09 NOTE — Patient Instructions (Addendum)
Take the doxycycline as prescribed, I recommend that she take it with a probiotic like Kefir Stop smoking Call the [facility] quit line for free nicotine replacement (1 (800) quit-now)  Continue using oxygen as you're doing We will call you with the results of the chest x-ray We will see you back in 4 months at which time we will get a pulmonary function test and a 6 minute walk test.  Edit: follow up with Tammy Parrett NP in 2 weeks.

## 2015-05-09 NOTE — Assessment & Plan Note (Signed)
This appears to have been a stable interval with the exception of a recent bout of bronchitis. Otherwise, her shortness of breath has not progressed. I explained to her at length today that the only reason for her respiratory bronchiolitis interstitial lung disease is her ongoing tobacco use. It's not clear to me how well she understands this despite the fact that I tried to explain at length.  Plan: Check a chest x-ray considering the recent increase in mucus production and cough Advised at length to stop smoking Will renew Symbicort and pro-air Doxycycline 5 days for bronchitis, call if worse Follow-up in 4 months with a pulmonary function test and a 6 minute walk  > 15 minutes spent with the patient as part of a 25 minute visit

## 2015-05-09 NOTE — Progress Notes (Signed)
Subjective:    Patient ID: Diane Gilbert, female    DOB: 1966-04-07, 49 y.o.   MRN: [id number]  Former patient of Dr. Gwenette Greet with respiratory bronchiolitis interstitial lung disease Dr. Gwenette Greet summarized her clinical situation as follows: HRCT 12/2013:  Mild GG, centrilobular nodularity PFTs 2015:  FEV1 1.48 (56%), but normal ratio.  No BD response.  TLC 60%, mild airtrapping.  DLCO 45%, but corrects to normal with Av Bronch 03/2014:  BAL with 85 WBC's, 21% eos.  TBBX with normal lung parenchyma, but not enough for possible specific diagnosis HP panel negative 03/2014 Pred trial 04/16/2014 >> unclear if any better.  VATS BX 06/2014:  RB-ILD (severe), ??? Component NSIP included in the differential Autoimmune labs normal 07/2014  HPI Chief Complaint  Patient presents with  . Follow-up    former [place] pt.   pt c/o prod cough with yellow mucus.  pt wears 2lpm 24/7.  pt also notes posterior b/l back pain when laying down and taking deep breaths.     Diane Gilbert says that she has been about the same.She still has shortness of breath when she walks.  Her breathing is worse when she lay flat.  She coughs every day and coughs up yellow to white sputum every day.   She thinks that her breathing is the same, not worse. She walks for exercise every day, for about 10 minutes maybe. She is out of her proAir. She is still taking Symbicort. She think sthat she has a cold now, she has had symptoms for 1-2 weeks.  Coughing up more mucus, sometimes feels feverish, a little back pain.  She thinks that she has been around some sick folks. She has minimal sinus congestion.    Past Medical History  Diagnosis Date  . Alcohol abuse   . Hypertension   . Insomnia   . Anxiety   . Depression   . Headache(784.0)   . Arthritis   . Interstitial lung disease ([place])   . Oxygen dependent   . Sleep apnea   . Shortness of breath dyspnea   . Schizophrenia ([place])       Review of Systems  Constitutional:  Negative for fever, chills and fatigue.  HENT: Negative for hearing loss, postnasal drip and rhinorrhea.   Respiratory: Positive for cough and shortness of breath. Negative for wheezing.   Cardiovascular: Negative for chest pain, palpitations and leg swelling.       Objective:   Physical Exam Filed Vitals:   05/09/15 0906  BP: 138/80  Pulse: 89  Height: _0  (1.626 m)  Weight: 237 lb (107.502 kg)  SpO2: 90%   2 L [place]  Gen: chronically ill appearing HENT: OP clear, TM's clear, neck supple PULM: Crackles bases bilaterally, normal percussion CV: RRR, no mgr, trace edema GI: BS+, soft, nontender Derm: no cyanosis or rash Psyche: normal mood and affect  CT images from 2015 reviewed showing a very fine micronodular pattern throughout with air trapping Previous notes from my partner reviewed where she was treated for her respiratory bronchiolitis interstitial lung disease.      Assessment & Plan:  Respiratory bronchiolitis interstitial lung disease ([place]) This appears to have been a stable interval with the exception of a recent bout of bronchitis. Otherwise, her shortness of breath has not progressed. I explained to her at length today that the only reason for her respiratory bronchiolitis interstitial lung disease is her ongoing tobacco use. It's not clear to me how well she understands this  despite the fact that I tried to explain at length.  Plan: Check a chest x-ray considering the recent increase in mucus production and cough Advised at length to stop smoking Will renew Symbicort and pro-air Doxycycline 5 days for bronchitis, call if worse Follow-up in 4 months with a pulmonary function test and a 6 minute walk  > 15 minutes spent with the patient as part of a 25 minute visit     Current outpatient prescriptions:  .  albuterol (PROVENTIL HFA;VENTOLIN HFA) 108 (90 BASE) MCG/ACT inhaler, Inhale 2 puffs into the lungs every 6 (six) hours as needed for wheezing or  shortness of breath., Disp: 1 Inhaler, Rfl: 3 .  amLODipine (NORVASC) 10 MG tablet, Take 1 tablet (10 mg total) by mouth daily. (Patient taking differently: Take 20 mg by mouth daily. ), Disp: 30 tablet, Rfl: 1 .  ARIPiprazole (ABILIFY) 5 MG tablet, Take 25 mg by mouth daily. , Disp: , Rfl:  .  budesonide-formoterol (SYMBICORT) 160-4.5 MCG/ACT inhaler, Inhale 2 puffs into the lungs 2 (two) times daily., Disp: 1 Inhaler, Rfl: 6 .  buPROPion (WELLBUTRIN SR) 150 MG 12 hr tablet, Take 150 mg by mouth 2 (two) times daily., Disp: , Rfl:  .  clotrimazole (LOTRIMIN) 1 % cream, Apply 1 application topically 2 (two) times daily., Disp: , Rfl:  .  fluconazole (DIFLUCAN) 100 MG tablet, Take 1 tablet (100 mg total) by mouth once. Repeat in 48-72 hours., Disp: 3 tablet, Rfl: 3 .  furosemide (LASIX) 20 MG tablet, Take 20 mg by mouth daily. , Disp: , Rfl: 0 .  gabapentin (NEURONTIN) 100 MG capsule, Take 100-400 mg by mouth 4 (four) times daily. Takes 100 mg 3 times daily and 400 mg at bedtime, Disp: , Rfl:  .  oxyCODONE-acetaminophen (PERCOCET/ROXICET) 5-325 MG per tablet, Take 1 tablet by mouth every 4 (four) hours as needed for moderate pain. As needed, Disp: 50 tablet, Rfl: 0 .  QUEtiapine (SEROQUEL) 100 MG tablet, Take 100 mg by mouth at bedtime., Disp: , Rfl:  .  tiZANidine (ZANAFLEX) 4 MG tablet, Take 4 mg by mouth 2 (two) times daily as needed for muscle spasms., Disp: , Rfl:  .  traZODone (DESYREL) 150 MG tablet, Take 150 mg by mouth at bedtime., Disp: , Rfl:  .  doxycycline (VIBRA-TABS) 100 MG tablet, Take 1 tablet (100 mg total) by mouth 2 (two) times daily., Disp: 10 tablet, Rfl: 0

## 2015-05-28 ENCOUNTER — Ambulatory Visit (INDEPENDENT_AMBULATORY_CARE_PROVIDER_SITE_OTHER): Payer: Medicaid Other | Admitting: Adult Health

## 2015-05-28 ENCOUNTER — Encounter: Payer: Self-pay | Admitting: Adult Health

## 2015-05-28 VITALS — BP 130/88 | HR 86 | Temp 98.1°F | Ht 64.0 in | Wt 234.0 lb

## 2015-05-28 DIAGNOSIS — J84115 Respiratory bronchiolitis interstitial lung disease: Secondary | ICD-10-CM

## 2015-05-28 DIAGNOSIS — J9611 Chronic respiratory failure with hypoxia: Secondary | ICD-10-CM | POA: Diagnosis not present

## 2015-05-28 NOTE — Progress Notes (Signed)
Subjective:    Patient ID: Diane Gilbert, female    DOB: October 02, 1965, 49 y.o.   MRN: [id number]  HPI  49 yo female smoker with RB -ILD   05/28/2015 Follow up : Bronchitis /RB-ILD , Chronic RF on O2  Patient returns for a two-week follow-up. Patient was seen last visit with increased cough, congestion. Patient was started on doxycycline. Patient returns today. Reports her cough is unchanged. However, she did not go and get her doxycycline as recommended. We discussed going to the pharmacy and pick up this prescription up. Patient denies any hemoptysis, orthopnea, PND or leg swelling. Patient says she continues to cough up thick white mucus. Patient up for sleep has not quit smoking. However, has cut down. Smoking cessation was encouraged.   Past Medical History  Diagnosis Date  . Alcohol abuse   . Hypertension   . Insomnia   . Anxiety   . Depression   . Headache(784.0)   . Arthritis   . Interstitial lung disease ([place])   . Oxygen dependent   . Sleep apnea   . Shortness of breath dyspnea   . Schizophrenia [facility])    Current Outpatient Prescriptions on File Prior to Visit  Medication Sig Dispense Refill  . albuterol (PROVENTIL HFA;VENTOLIN HFA) 108 (90 BASE) MCG/ACT inhaler Inhale 2 puffs into the lungs every 6 (six) hours as needed for wheezing or shortness of breath. 1 Inhaler 3  . amLODipine (NORVASC) 10 MG tablet Take 1 tablet (10 mg total) by mouth daily. (Patient taking differently: Take 20 mg by mouth daily. ) 30 tablet 1  . budesonide-formoterol (SYMBICORT) 160-4.5 MCG/ACT inhaler Inhale 2 puffs into the lungs 2 (two) times daily. 1 Inhaler 6  . buPROPion (WELLBUTRIN SR) 150 MG 12 hr tablet Take 150 mg by mouth 2 (two) times daily.    . clotrimazole (LOTRIMIN) 1 % cream Apply 1 application topically 2 (two) times daily.    . furosemide (LASIX) 20 MG tablet Take 20 mg by mouth daily.   0  . gabapentin (NEURONTIN) 100 MG capsule Take 100-400 mg by mouth 4 (four) times  daily. Takes 100 mg 3 times daily and 400 mg at bedtime    . oxyCODONE-acetaminophen (PERCOCET/ROXICET) 5-325 MG per tablet Take 1 tablet by mouth every 4 (four) hours as needed for moderate pain. As needed 50 tablet 0  . QUEtiapine (SEROQUEL) 100 MG tablet Take 100 mg by mouth at bedtime.    Marland Kitchen tiZANidine (ZANAFLEX) 4 MG tablet Take 4 mg by mouth 2 (two) times daily as needed for muscle spasms.    . traZODone (DESYREL) 150 MG tablet Take 150 mg by mouth at bedtime.    . ARIPiprazole (ABILIFY) 5 MG tablet Take 25 mg by mouth daily.     Marland Kitchen doxycycline (VIBRA-TABS) 100 MG tablet Take 1 tablet (100 mg total) by mouth 2 (two) times daily. (Patient not taking: Reported on 05/28/2015) 10 tablet 0  . fluconazole (DIFLUCAN) 100 MG tablet Take 1 tablet (100 mg total) by mouth once. Repeat in 48-72 hours. (Patient not taking: Reported on 05/28/2015) 3 tablet 3   No current facility-administered medications on file prior to visit.     Review of Systems Constitutional:   No  weight loss, night sweats,  Fevers, chills, + fatigue, or  lassitude.  HEENT:   No headaches,  Difficulty swallowing,  Tooth/dental problems, or  Sore throat,                No  sneezing, itching, ear ache,  +nasal congestion, post nasal drip,   CV:  No chest pain,  Orthopnea, PND, swelling in lower extremities, anasarca, dizziness, palpitations, syncope.   GI  No heartburn, indigestion, abdominal pain, +nausea,  No  diarrhea, change in bowel habits, loss of appetite, bloody stools.   Resp:    No chest wall deformity  Skin: no rash or lesions.  GU: no dysuria, change in color of urine, no urgency or frequency.  No flank pain, no hematuria   MS:  No joint pain or swelling.  No decreased range of motion.  No back pain.  Psych:  No change in mood or affect. No depression or anxiety.  No memory loss.         Objective:   Physical Exam GEN: A/Ox3; pleasant , NAD, morbidly obese on O2   HEENT:  [place]/AT,  EACs-clear,  TMs-wnl, NOSE-clear, THROAT-clear, no lesions, no postnasal drip or exudate noted.   NECK:  Supple w/ fair ROM; no JVD; normal carotid impulses w/o bruits; no thyromegaly or nodules palpated; no lymphadenopathy.  RESP  Faint BB Crackles , .no accessory muscle use, no dullness to percussion  CARD:  RRR, no m/r/g  , no peripheral edema, pulses intact, no cyanosis or clubbing.  GI:   Soft & nt; nml bowel sounds; no organomegaly or masses detected.  Musco: Warm bil, no deformities or joint swelling noted.   Neuro: alert, no focal deficits noted.    Skin: Warm, no lesions or rashes         Assessment & Plan:

## 2015-05-28 NOTE — Progress Notes (Signed)
Reviewed, I agree with above

## 2015-05-28 NOTE — Assessment & Plan Note (Signed)
Cont on O2

## 2015-05-28 NOTE — Assessment & Plan Note (Signed)
Encouraged on smoking cessaitoin  Mild bronchitis , take doxycycline as directed  Plan  Start Doxycyline as directed.  Continue to work on not smoking .  Continue on oxygen .  follow up Dr. Lake Bells in March as planned  Please contact office for sooner follow up if symptoms do not improve or worsen or seek emergency care

## 2015-05-28 NOTE — Patient Instructions (Signed)
Start Doxycyline as directed.  Continue to work on not smoking .  Continue on oxygen .  follow up Dr. Lake Bells in March as planned  Please contact office for sooner follow up if symptoms do not improve or worsen or seek emergency care

## 2015-09-06 ENCOUNTER — Other Ambulatory Visit: Payer: Self-pay | Admitting: Pulmonary Disease

## 2015-09-06 ENCOUNTER — Ambulatory Visit (INDEPENDENT_AMBULATORY_CARE_PROVIDER_SITE_OTHER): Payer: Medicaid Other | Admitting: Pulmonary Disease

## 2015-09-06 ENCOUNTER — Encounter: Payer: Self-pay | Admitting: Pulmonary Disease

## 2015-09-06 ENCOUNTER — Ambulatory Visit (INDEPENDENT_AMBULATORY_CARE_PROVIDER_SITE_OTHER)
Admission: RE | Admit: 2015-09-06 | Discharge: 2015-09-06 | Disposition: A | Discharge status: Home / Self Care | Payer: Medicaid Other | Source: Ambulatory Visit | Attending: Pulmonary Disease | Admitting: Pulmonary Disease

## 2015-09-06 ENCOUNTER — Other Ambulatory Visit (INDEPENDENT_AMBULATORY_CARE_PROVIDER_SITE_OTHER): Payer: Medicaid Other

## 2015-09-06 VITALS — BP 142/88 | HR 84 | Ht 66.0 in | Wt 243.0 lb

## 2015-09-06 DIAGNOSIS — J9611 Chronic respiratory failure with hypoxia: Secondary | ICD-10-CM

## 2015-09-06 DIAGNOSIS — J84115 Respiratory bronchiolitis interstitial lung disease: Secondary | ICD-10-CM

## 2015-09-06 DIAGNOSIS — R0600 Dyspnea, unspecified: Secondary | ICD-10-CM

## 2015-09-06 DIAGNOSIS — Z5181 Encounter for therapeutic drug level monitoring: Secondary | ICD-10-CM

## 2015-09-06 DIAGNOSIS — F17200 Nicotine dependence, unspecified, uncomplicated: Secondary | ICD-10-CM

## 2015-09-06 DIAGNOSIS — J432 Centrilobular emphysema: Secondary | ICD-10-CM

## 2015-09-06 LAB — PULMONARY FUNCTION TEST
DL/VA % PRED: 95 %
DL/VA: 4.81 ml/min/mmHg/L
DLCO cor % pred: 48 %
DLCO cor: 13.03 ml/min/mmHg
DLCO unc % pred: 49 %
DLCO unc: 13.3 ml/min/mmHg
FEF 25-75 POST: 1.37 L/s
FEF 25-75 Pre: 1.73 L/sec
FEF2575-%CHANGE-POST: -21 %
FEF2575-%Pred-Post: 50 %
FEF2575-%Pred-Pre: 64 %
FEV1-%Change-Post: -5 %
FEV1-%Pred-Post: 53 %
FEV1-%Pred-Pre: 56 %
FEV1-Post: 1.37 L
FEV1-Pre: 1.45 L
FEV1FVC-%CHANGE-POST: 0 %
FEV1FVC-%Pred-Pre: 104 %
FEV6-%Change-Post: -5 %
FEV6-%PRED-PRE: 54 %
FEV6-%Pred-Post: 52 %
FEV6-Post: 1.61 L
FEV6-Pre: 1.7 L
FEV6FVC-%Pred-Post: 103 %
FEV6FVC-%Pred-Pre: 103 %
FVC-%CHANGE-POST: -5 %
FVC-%PRED-POST: 50 %
FVC-%PRED-PRE: 53 %
FVC-PRE: 1.7 L
FVC-Post: 1.61 L
POST FEV1/FVC RATIO: 85 %
POST FEV6/FVC RATIO: 100 %
PRE FEV1/FVC RATIO: 85 %
Pre FEV6/FVC Ratio: 100 %
RV % pred: 79 %
RV: 1.45 L
TLC % pred: 58 %
TLC: 3.14 L

## 2015-09-06 LAB — BASIC METABOLIC PANEL
BUN: 9 mg/dL (ref 6–23)
CALCIUM: 9.6 mg/dL (ref 8.4–10.5)
CO2: 29 mEq/L (ref 19–32)
Chloride: 101 mEq/L (ref 96–112)
Creatinine, Ser: 0.83 mg/dL (ref 0.40–1.20)
GFR: 94.64 mL/min (ref >60.00)
GLUCOSE: 96 mg/dL (ref 70–99)
POTASSIUM: 4.1 meq/L (ref 3.5–5.1)
SODIUM: 136 meq/L (ref 135–145)

## 2015-09-06 LAB — BRAIN NATRIURETIC PEPTIDE: PRO B NATRI PEPTIDE: 12 pg/mL (ref 0.0–100.0)

## 2015-09-06 MED ORDER — FUROSEMIDE 20 MG PO TABS: 20.0000 mg | ORAL_TABLET | Freq: Every day | ORAL | Status: DC

## 2015-09-06 NOTE — Progress Notes (Signed)
Subjective:    Patient ID: Diane Gilbert, female    DOB: 03-Feb-1966, 50 y.o.   MRN: [id number]  Former patient of Dr. Gwenette Greet with respiratory bronchiolitis interstitial lung disease Dr. Gwenette Greet summarized her clinical situation as follows: HRCT 12/2013:  Mild GG, centrilobular nodularity PFTs 2015:  FEV1 1.48 (56%), but normal ratio.  No BD response.  TLC 60%, mild airtrapping.  DLCO 45%, but corrects to normal with Av Bronch 03/2014:  BAL with 85 WBC's, 21% eos.  TBBX with normal lung parenchyma, but not enough for possible specific diagnosis HP panel negative 03/2014 Pred trial 04/16/2014 >> unclear if any better.  VATS BX 06/2014:  RB-ILD (severe), ??? Component NSIP included in the differential Autoimmune labs normal 07/2014 March 2017 6 minute walk distance 144 m, O2 saturation nadir 81% on room air March 2017 pulmonary function testing ratio 85%, FVC 1.61 L (50% predicted) sees, FEV1 1.37 L (53% predicted), total lung capacity 3.14 L (58% predicted), DLCO 13.3 (49% predicted).  HPI Chief Complaint  Patient presents with  . Follow-up    review PFT and 24m.  pt c/o fatigue, sob with exertion, prod cough with clear/white mucus.      JJhordynsays she continues to struggle with dyspnea, particularly when lying flat.  She feels it every night.  NO leg swelling.  She has been using her oxygen since her VATS biopsy.  No hospital visits but she has been with respiratory colds at home.  She had to see our nurse practioner and received doxycycline and prednisone.  Still smokes 2-3 cigarettes a day.  Gets fatigued with exercise.  No known heart disease but she feels "fluttering" in her chest sometimes.   No leg swelling.  Doesn't take the furosemide.    Past Medical History  Diagnosis Date  . Alcohol abuse   . Hypertension   . Insomnia   . Anxiety   . Depression   . Headache(784.0)   . Arthritis   . [place] disease [place]   . Oxygen dependent   . Sleep apnea   .  Shortness of breath dyspnea   . Schizophrenia ([place]       Review of Systems  Constitutional: Negative for fever, chills and fatigue.  HENT: Negative for hearing loss, postnasal drip and rhinorrhea.   Respiratory: Positive for cough and shortness of breath. Negative for wheezing.   Cardiovascular: Negative for chest pain, palpitations and leg swelling.       Objective:   Physical Exam Filed Vitals:   09/06/15 1326  BP: 142/88  Pulse: 84  Height: _0  (1.676 m)  Weight: 243 lb (110.224 kg)  SpO2: 94%   2 L [place]  Gen: chronically ill appearing HENT: OP clear, TM's clear, neck supple PULM: Crackles bases bilaterally, normal percussion CV: RRR, no mgr, trace edema GI: BS+, soft, nontender Derm: no cyanosis or rash Psyche: normal mood and affect  Previous notes from her nurse practitioner reviewed where she was treated for a flare bronchitis December 2015 echocardiogram results reviewed, see summary below      Assessment & Plan:  Respiratory bronchiolitis interstitial lung disease ([place] She complains of worsening dyspnea and continues to smoke cigarettes but despite that, her lung function testing has not changed much in 2 years. I explained to her today at length that she needs to stop smoking as this is the pulmonary and 1 she has RB ILD. I see no reason for other medical intervention at this time.  Plan:  Exercise regularly Continue oxygen as prescribed Stop smoking Follow-up 4 months  Smoking addiction Counseled to quit smoking today  Chronic respiratory failure (HCC) Continue oxygen at 2 L continuously.  Dyspnea She complains of worsening dyspnea recently with orthopnea. On exam she has crackles in the bases of her lungs. Her weight is up about 9 pounds today and her blood pressure is poorly controlled. Her echocardiogram which I reviewed showed results in December 2015 of a normal LVEF. I suspect she has diastolic heart failure.  Plan: Restart Lasix, she has  recently been noncompliant Chest x-ray BNP Basic metabolic panel Repeat basic metabolic panel in 1 week to check potassium level.     Current outpatient prescriptions:  .  albuterol (PROVENTIL HFA;VENTOLIN HFA) 108 (90 BASE) MCG/ACT inhaler, Inhale 2 puffs into the lungs every 6 (six) hours as needed for wheezing or shortness of breath., Disp: 1 Inhaler, Rfl: 3 .  amLODipine (NORVASC) 10 MG tablet, Take 1 tablet (10 mg total) by mouth daily. (Patient taking differently: Take 20 mg by mouth daily. ), Disp: 30 tablet, Rfl: 1 .  ARIPiprazole (ABILIFY) 5 MG tablet, Take 25 mg by mouth daily. , Disp: , Rfl:  .  budesonide-formoterol (SYMBICORT) 160-4.5 MCG/ACT inhaler, Inhale 2 puffs into the lungs 2 (two) times daily., Disp: 1 Inhaler, Rfl: 6 .  buPROPion (WELLBUTRIN SR) 150 MG 12 hr tablet, Take 150 mg by mouth 2 (two) times daily., Disp: , Rfl:  .  clotrimazole (LOTRIMIN) 1 % cream, Apply 1 application topically 2 (two) times daily., Disp: , Rfl:  .  furosemide (LASIX) 20 MG tablet, Take 1 tablet (20 mg total) by mouth daily., Disp: 30 tablet, Rfl: 0 .  gabapentin (NEURONTIN) 100 MG capsule, Take 100-400 mg by mouth 4 (four) times daily. Takes 100 mg 3 times daily and 400 mg at bedtime, Disp: , Rfl:  .  oxyCODONE-acetaminophen (PERCOCET/ROXICET) 5-325 MG per tablet, Take 1 tablet by mouth every 4 (four) hours as needed for moderate pain. As needed, Disp: 50 tablet, Rfl: 0 .  QUEtiapine (SEROQUEL) 100 MG tablet, Take 100 mg by mouth at bedtime., Disp: , Rfl:  .  tiZANidine (ZANAFLEX) 4 MG tablet, Take 4 mg by mouth 2 (two) times daily as needed for muscle spasms., Disp: , Rfl:  .  traZODone (DESYREL) 150 MG tablet, Take 150 mg by mouth at bedtime., Disp: , Rfl:

## 2015-09-06 NOTE — Patient Instructions (Signed)
Stop smoking Exercise regularly Keep using her oxygen as you're doing Start taking Lasix again Come back in one week for another lab test to check your potassium level We will call you with the results of the chest x-ray in the bloodwork We will see you back in 4 months

## 2015-09-06 NOTE — Progress Notes (Signed)
PFT done today.

## 2015-09-06 NOTE — Assessment & Plan Note (Signed)
She complains of worsening dyspnea and continues to smoke cigarettes but despite that, her lung function testing has not changed much in 2 years. I explained to her today at length that she needs to stop smoking as this is the pulmonary and 1 she has RB ILD. I see no reason for other medical intervention at this time.  Plan: Exercise regularly Continue oxygen as prescribed Stop smoking Follow-up 4 months

## 2015-09-06 NOTE — Assessment & Plan Note (Signed)
She complains of worsening dyspnea recently with orthopnea. On exam she has crackles in the bases of her lungs. Her weight is up about 9 pounds today and her blood pressure is poorly controlled. Her echocardiogram which I reviewed showed results in December 2015 of a normal LVEF. I suspect she has diastolic heart failure.  Plan: Restart Lasix, she has recently been noncompliant Chest x-ray BNP Basic metabolic panel Repeat basic metabolic panel in 1 week to check potassium level.

## 2015-09-06 NOTE — Assessment & Plan Note (Signed)
Continue oxygen at 2 L continuously.

## 2015-09-06 NOTE — Assessment & Plan Note (Signed)
Counseled to quit smoking today

## 2015-09-11 NOTE — Progress Notes (Signed)
Quick Note:  Called and spoke with pt. Reviewed results and recs. Pt voiced understanding and had no further questions. ______ 

## 2015-09-13 ENCOUNTER — Other Ambulatory Visit (INDEPENDENT_AMBULATORY_CARE_PROVIDER_SITE_OTHER): Payer: Medicaid Other

## 2015-09-13 DIAGNOSIS — Z5181 Encounter for therapeutic drug level monitoring: Secondary | ICD-10-CM

## 2015-09-13 LAB — BASIC METABOLIC PANEL
BUN: 13 mg/dL (ref 6–23)
CHLORIDE: 101 meq/L (ref 96–112)
CO2: 33 mEq/L — ABNORMAL HIGH (ref 19–32)
Calcium: 9.6 mg/dL (ref 8.4–10.5)
Creatinine, Ser: 0.9 mg/dL (ref 0.40–1.20)
GFR: 86.19 mL/min (ref >60.00)
GLUCOSE: 113 mg/dL — AB (ref 70–99)
POTASSIUM: 4 meq/L (ref 3.5–5.1)
Sodium: 139 mEq/L (ref 135–145)

## 2015-10-21 ENCOUNTER — Other Ambulatory Visit: Payer: Self-pay

## 2015-10-21 DIAGNOSIS — Z1231 Encounter for screening mammogram for malignant neoplasm of breast: Secondary | ICD-10-CM

## 2015-11-26 ENCOUNTER — Ambulatory Visit: Payer: Medicaid Other

## 2015-12-05 ENCOUNTER — Ambulatory Visit
Admission: RE | Admit: 2015-12-05 | Discharge: 2015-12-05 | Disposition: A | Discharge status: Home / Self Care | Payer: Medicaid Other | Source: Ambulatory Visit

## 2015-12-05 DIAGNOSIS — Z1231 Encounter for screening mammogram for malignant neoplasm of breast: Secondary | ICD-10-CM

## 2016-01-16 ENCOUNTER — Ambulatory Visit (INDEPENDENT_AMBULATORY_CARE_PROVIDER_SITE_OTHER): Payer: Medicaid Other | Admitting: Pulmonary Disease

## 2016-01-16 ENCOUNTER — Encounter: Payer: Self-pay | Admitting: Pulmonary Disease

## 2016-01-16 VITALS — BP 140/84 | HR 89 | Ht 66.0 in | Wt 244.0 lb

## 2016-01-16 DIAGNOSIS — J9611 Chronic respiratory failure with hypoxia: Secondary | ICD-10-CM | POA: Diagnosis not present

## 2016-01-16 DIAGNOSIS — F17200 Nicotine dependence, unspecified, uncomplicated: Secondary | ICD-10-CM

## 2016-01-16 DIAGNOSIS — J84115 Respiratory bronchiolitis interstitial lung disease: Secondary | ICD-10-CM

## 2016-01-16 DIAGNOSIS — M7989 Other specified soft tissue disorders: Secondary | ICD-10-CM | POA: Insufficient documentation

## 2016-01-16 NOTE — Assessment & Plan Note (Signed)
While at this time I have no objective evidence at this is worsens, she has a disease which is solely due to ongoing tobacco use. She was counseled again today at length that she needs to quit smoking.  Plan: Quit smoking Repeat lung function test in March 2018 Follow-up 6 months

## 2016-01-16 NOTE — Progress Notes (Signed)
Subjective:    Patient ID: Diane Gilbert, female    DOB: Dec 19, 1965, 50 y.o.   MRN: [id number]  Former patient of Dr. Gwenette Greet with respiratory bronchiolitis interstitial lung disease Dr. Gwenette Greet summarized her clinical situation as follows: HRCT 12/2013:  Mild GG, centrilobular nodularity PFTs 2015:  FEV1 1.48 (56%), but normal ratio.  No BD response.  TLC 60%, mild airtrapping.  DLCO 45%, but corrects to normal with Av Bronch 03/2014:  BAL with 85 WBC's, 21% eos.  TBBX with normal lung parenchyma, but not enough for possible specific diagnosis HP panel negative 03/2014 Pred trial 04/16/2014 >> unclear if any better.  VATS BX 06/2014:  RB-ILD (severe), ??? Component NSIP included in the differential [facility] labs normal 07/2014 March 2017 6 minute walk distance 144 m, O2 saturation nadir 81% on room air March 2017 pulmonary function testing ratio 85%, FVC 1.61 L (50% predicted) sees, FEV1 1.37 L (53% predicted), total lung capacity 3.14 L (58% predicted), DLCO 13.3 (49% predicted).  HPI Chief Complaint  Patient presents with  . Follow-up    pt c/o L sided lower back pain, L sided lower extremity swelling X1 month.  Pt states her SOB with any exertion is worsened by hot/humid temps.    Diane Gilbert says she has been experiencing leg swelling in her left leg since last month.  This limits her abiltiy to walk.  It is the first time she has had this.  Some pain with walking. No recnet long trips or hospital visit.  Breathing is still a problem > worse in heat.  Minimal cough, sometimes thick white mucus production.  She continues to smoke cigarettes throughout the day, now about 3 cigarettes a day.  She has tried to quit, but she feels that it is really hard to quit.  She continues to use and benefit from her oxygen daily.   Past Medical History  Diagnosis Date  . Alcohol abuse   . Hypertension   . Insomnia   . Anxiety   . Depression   . Headache(784.0)   . Arthritis   .  Interstitial lung disease ([place])   . Oxygen dependent   . Sleep apnea   . Shortness of breath dyspnea   . Schizophrenia ([place])       Review of Systems  Constitutional: Negative for fever, chills and fatigue.  HENT: Negative for hearing loss, postnasal drip and rhinorrhea.   Respiratory: Positive for cough and shortness of breath. Negative for wheezing.   Cardiovascular: Negative for chest pain, palpitations and leg swelling.       Objective:   Physical Exam Filed Vitals:   01/16/16 1141  BP: 140/84  Pulse: 89  Height: _0  (1.676 m)  Weight: 244 lb (110.678 kg)  SpO2: 93%   2 L [place]  Gen: chronically ill appearing HENT: OP clear, TM's clear, neck supple PULM: Crackles bases bilaterally, normal percussion CV: RRR, no mgr, trace edema GI: BS+, soft, nontender Derm: no cyanosis or rash Psyche: normal mood and affect   Last PFTs reviewed  CBC    Component Value Date/Time   WBC 10.1 07/18/2014 0500   RBC 4.55 07/18/2014 0500   RBC 3.33* 02/19/2012 1250   HGB 12.3 07/18/2014 0500   HCT 40.2 07/18/2014 0500   PLT 197 07/18/2014 0500   MCV 88.4 07/18/2014 0500   MCH 27.0 07/18/2014 0500   MCHC 30.6 07/18/2014 0500   RDW 14.3 07/18/2014 0500   LYMPHSABS 1.6 02/19/2012 0520   MONOABS  2.9* 02/19/2012 0520   EOSABS 0.0 02/19/2012 0520   BASOSABS 0.0 02/19/2012 0520          Assessment & Plan:  Chronic respiratory failure ([place]) She continues to use and benefit from her oxygen.  Today will have her perform another qualifying walk to ensure that she still qualifies for oxygen use.  Plan: Continue using oxygen 2 L continuously.  Respiratory bronchiolitis interstitial lung disease ([place]) While at this time I have no objective evidence at this is worsens, she has a disease which is solely due to ongoing tobacco use. She was counseled again today at length that she needs to quit smoking.  Plan: Quit smoking Repeat lung function test in March 2018 Follow-up 6  months  Smoking addiction Counseled at length to quit smoking today.     Current outpatient prescriptions:  .  albuterol (PROVENTIL HFA;VENTOLIN HFA) 108 (90 BASE) MCG/ACT inhaler, Inhale 2 puffs into the lungs every 6 (six) hours as needed for wheezing or shortness of breath., Disp: 1 Inhaler, Rfl: 3 .  amLODipine (NORVASC) 10 MG tablet, Take 1 tablet (10 mg total) by mouth daily. (Patient taking differently: Take 20 mg by mouth daily. ), Disp: 30 tablet, Rfl: 1 .  ARIPiprazole (ABILIFY) 5 MG tablet, Take 25 mg by mouth daily. , Disp: , Rfl:  .  budesonide-formoterol (SYMBICORT) 160-4.5 MCG/ACT inhaler, Inhale 2 puffs into the lungs 2 (two) times daily., Disp: 1 Inhaler, Rfl: 6 .  buPROPion (WELLBUTRIN SR) 150 MG 12 hr tablet, Take 150 mg by mouth 2 (two) times daily., Disp: , Rfl:  .  clotrimazole (LOTRIMIN) 1 % cream, Apply 1 application topically 2 (two) times daily., Disp: , Rfl:  .  furosemide (LASIX) 20 MG tablet, Take 1 tablet (20 mg total) by mouth daily., Disp: 30 tablet, Rfl: 0 .  gabapentin (NEURONTIN) 100 MG capsule, Take 100-400 mg by mouth 4 (four) times daily. Takes 100 mg 3 times daily and 400 mg at bedtime, Disp: , Rfl:  .  oxyCODONE-acetaminophen (PERCOCET/ROXICET) 5-325 MG per tablet, Take 1 tablet by mouth every 4 (four) hours as needed for moderate pain. As needed, Disp: 50 tablet, Rfl: 0 .  QUEtiapine (SEROQUEL) 100 MG tablet, Take 100 mg by mouth at bedtime., Disp: , Rfl:  .  tiZANidine (ZANAFLEX) 4 MG tablet, Take 4 mg by mouth 2 (two) times daily as needed for muscle spasms., Disp: , Rfl:  .  traZODone (DESYREL) 150 MG tablet, Take 150 mg by mouth at bedtime., Disp: , Rfl:

## 2016-01-16 NOTE — Assessment & Plan Note (Signed)
She continues to use and benefit from her oxygen.  Today will have her perform another qualifying walk to ensure that she still qualifies for oxygen use.  Plan: Continue using oxygen 2 L continuously.

## 2016-01-16 NOTE — Assessment & Plan Note (Signed)
She has isolated left leg pain and swelling. I worry about the possibility of a blood clot.  Plan: Lower extremity Doppler.

## 2016-01-16 NOTE — Assessment & Plan Note (Signed)
Counseled at length to quit smoking today.

## 2016-01-16 NOTE — Patient Instructions (Signed)
We will call you with the results of the ultrasound test Stop smoking Keep using oxygen as you're doing We'll see you back in 6 months or sooner if needed

## 2016-01-17 ENCOUNTER — Encounter (HOSPITAL_COMMUNITY): Payer: Medicaid Other

## 2016-01-17 ENCOUNTER — Inpatient Hospital Stay (HOSPITAL_COMMUNITY): Admission: RE | Admit: 2016-01-17 | Payer: Medicaid Other | Source: Ambulatory Visit

## 2016-01-21 ENCOUNTER — Inpatient Hospital Stay (HOSPITAL_COMMUNITY): Admission: RE | Admit: 2016-01-21 | Payer: Medicaid Other | Source: Ambulatory Visit

## 2016-01-21 IMAGING — DX DG CHEST 1V PORT
1 series · 1 of 1 positions shown · non-contrast
Comparison: 01/10/2014

CLINICAL DATA: Post bronchoscopy right upper lobe

EXAM:
PORTABLE CHEST - 1 VIEW

[AP]
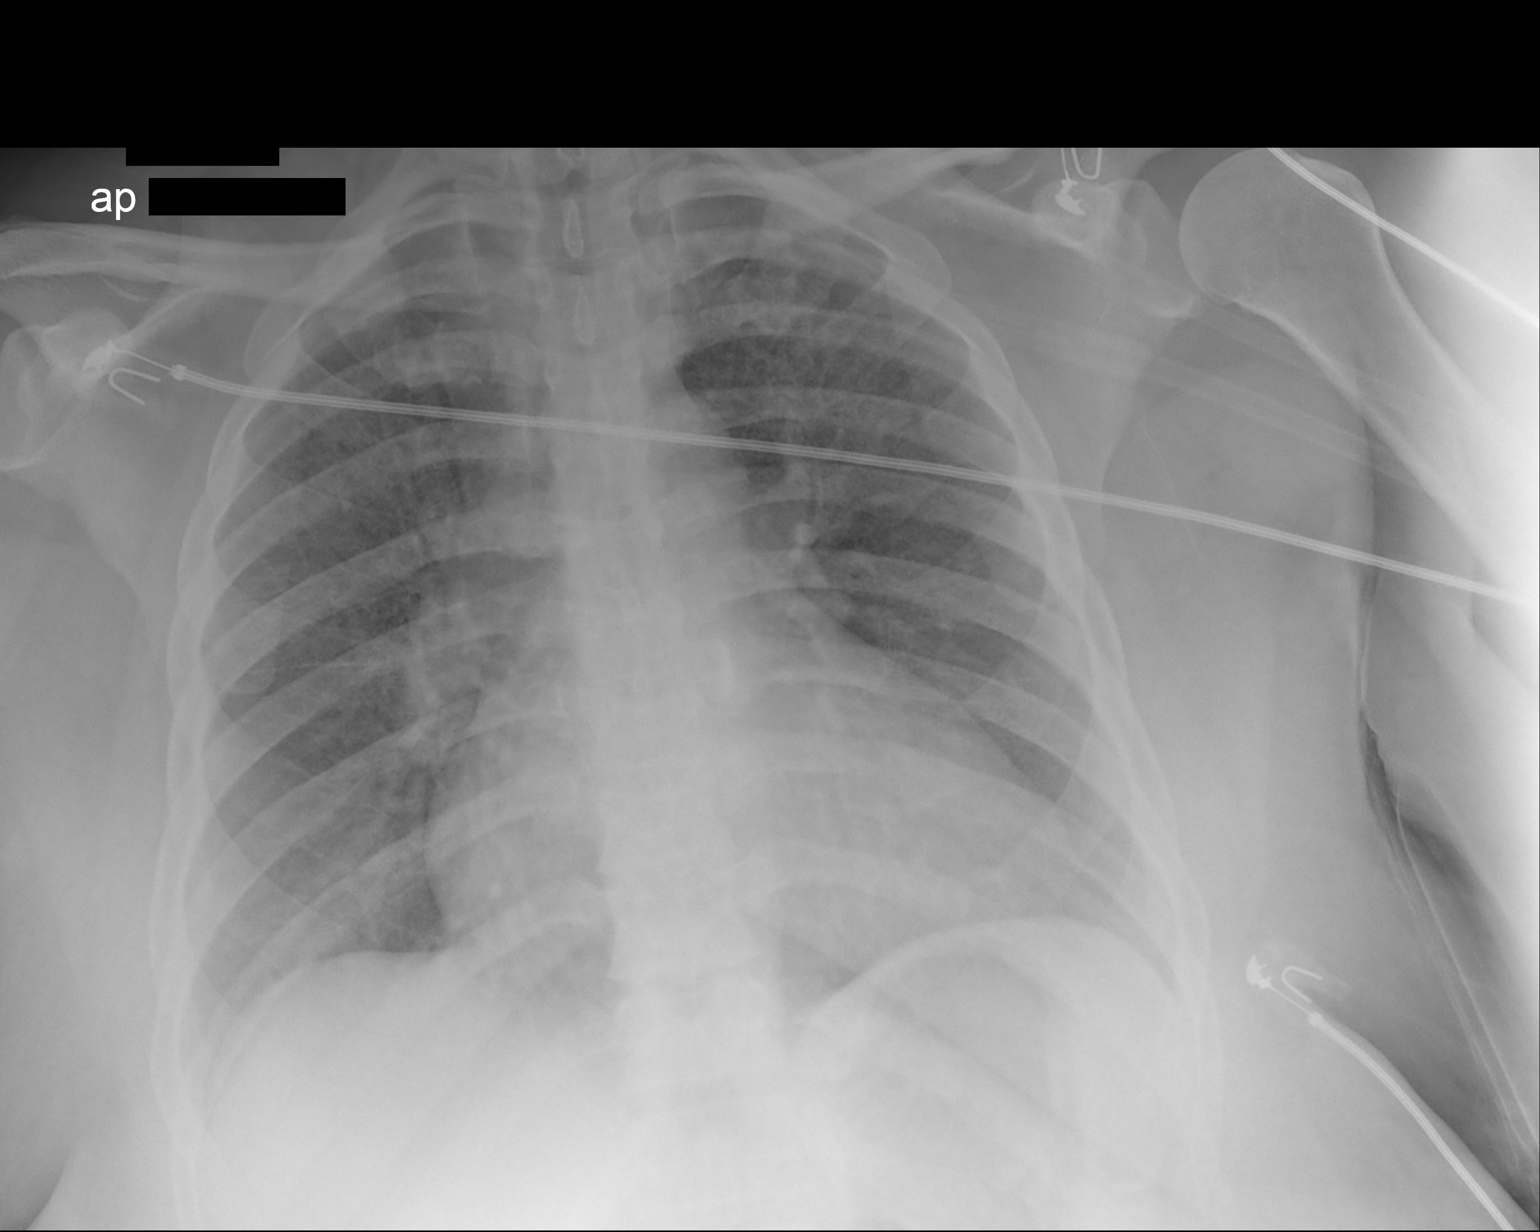

[1 of 1 positions shown; findings below may reference images not displayed]

FINDINGS: Cardiac enlargement with mild vascular congestion. Negative for
heart failure or edema. Lungs are clear of infiltrate or effusion.
No pneumothorax.
IMPRESSION: Cardiac enlargement with vascular congestion

Negative for pneumothorax.

## 2016-01-29 ENCOUNTER — Inpatient Hospital Stay (HOSPITAL_COMMUNITY): Admission: RE | Admit: 2016-01-29 | Payer: Medicaid Other | Source: Ambulatory Visit

## 2016-01-29 ENCOUNTER — Ambulatory Visit (HOSPITAL_COMMUNITY)
Admission: RE | Admit: 2016-01-29 | Discharge: 2016-01-29 | Disposition: A | Discharge status: Home / Self Care | Payer: Medicaid Other | Source: Ambulatory Visit | Attending: Cardiovascular Disease | Admitting: Cardiovascular Disease

## 2016-01-29 DIAGNOSIS — F419 Anxiety disorder, unspecified: Secondary | ICD-10-CM | POA: Insufficient documentation

## 2016-01-29 DIAGNOSIS — G4700 Insomnia, unspecified: Secondary | ICD-10-CM | POA: Diagnosis not present

## 2016-01-29 DIAGNOSIS — G4730 Sleep apnea, unspecified: Secondary | ICD-10-CM | POA: Insufficient documentation

## 2016-01-29 DIAGNOSIS — I10 Essential (primary) hypertension: Secondary | ICD-10-CM | POA: Insufficient documentation

## 2016-01-29 DIAGNOSIS — M7989 Other specified soft tissue disorders: Secondary | ICD-10-CM

## 2016-01-29 DIAGNOSIS — F209 Schizophrenia, unspecified: Secondary | ICD-10-CM | POA: Insufficient documentation

## 2016-01-29 DIAGNOSIS — F329 Major depressive disorder, single episode, unspecified: Secondary | ICD-10-CM | POA: Insufficient documentation

## 2016-01-29 DIAGNOSIS — M79605 Pain in left leg: Secondary | ICD-10-CM | POA: Diagnosis not present

## 2016-01-30 ENCOUNTER — Telehealth: Payer: Self-pay

## 2016-01-30 NOTE — Telephone Encounter (Signed)
Spoke with pt, aware of U/s results.  Nothing further needed.

## 2016-01-30 NOTE — Telephone Encounter (Signed)
-----  Message from Juanito Doom, MD sent at 01/29/2016 10:36 PM EDT ----- A, Please let her know that her ultrasound showed no evidence of clot. Thanks B

## 2016-03-24 ENCOUNTER — Emergency Department (HOSPITAL_COMMUNITY): Payer: Medicaid Other

## 2016-03-24 ENCOUNTER — Encounter (HOSPITAL_COMMUNITY): Payer: Self-pay

## 2016-03-24 ENCOUNTER — Inpatient Hospital Stay (HOSPITAL_COMMUNITY)
Admission: EM | Admit: 2016-03-24 | Discharge: 2016-03-30 | DRG: 189 | Disposition: A | Discharge status: Home / Self Care | Payer: Medicaid Other | Attending: Internal Medicine | Admitting: Internal Medicine

## 2016-03-24 DIAGNOSIS — F419 Anxiety disorder, unspecified: Secondary | ICD-10-CM | POA: Diagnosis present

## 2016-03-24 DIAGNOSIS — I5030 Unspecified diastolic (congestive) heart failure: Secondary | ICD-10-CM | POA: Diagnosis present

## 2016-03-24 DIAGNOSIS — K5900 Constipation, unspecified: Secondary | ICD-10-CM | POA: Diagnosis present

## 2016-03-24 DIAGNOSIS — Z9981 Dependence on supplemental oxygen: Secondary | ICD-10-CM

## 2016-03-24 DIAGNOSIS — M1990 Unspecified osteoarthritis, unspecified site: Secondary | ICD-10-CM | POA: Diagnosis present

## 2016-03-24 DIAGNOSIS — R0902 Hypoxemia: Secondary | ICD-10-CM

## 2016-03-24 DIAGNOSIS — F209 Schizophrenia, unspecified: Secondary | ICD-10-CM

## 2016-03-24 DIAGNOSIS — I110 Hypertensive heart disease with heart failure: Secondary | ICD-10-CM | POA: Diagnosis present

## 2016-03-24 DIAGNOSIS — Z79899 Other long term (current) drug therapy: Secondary | ICD-10-CM

## 2016-03-24 DIAGNOSIS — G4730 Sleep apnea, unspecified: Secondary | ICD-10-CM | POA: Diagnosis present

## 2016-03-24 DIAGNOSIS — J9621 Acute and chronic respiratory failure with hypoxia: Principal | ICD-10-CM | POA: Diagnosis present

## 2016-03-24 DIAGNOSIS — J069 Acute upper respiratory infection, unspecified: Secondary | ICD-10-CM | POA: Diagnosis present

## 2016-03-24 DIAGNOSIS — Z825 Family history of asthma and other chronic lower respiratory diseases: Secondary | ICD-10-CM

## 2016-03-24 DIAGNOSIS — Z79891 Long term (current) use of opiate analgesic: Secondary | ICD-10-CM

## 2016-03-24 DIAGNOSIS — J84115 Respiratory bronchiolitis interstitial lung disease: Secondary | ICD-10-CM | POA: Diagnosis present

## 2016-03-24 DIAGNOSIS — F17200 Nicotine dependence, unspecified, uncomplicated: Secondary | ICD-10-CM

## 2016-03-24 DIAGNOSIS — Z87891 Personal history of nicotine dependence: Secondary | ICD-10-CM

## 2016-03-24 DIAGNOSIS — Z6841 Body Mass Index (BMI) 40.0 and over, adult: Secondary | ICD-10-CM

## 2016-03-24 DIAGNOSIS — I509 Heart failure, unspecified: Secondary | ICD-10-CM

## 2016-03-24 DIAGNOSIS — F329 Major depressive disorder, single episode, unspecified: Secondary | ICD-10-CM | POA: Diagnosis present

## 2016-03-24 LAB — COMPREHENSIVE METABOLIC PANEL
ALK PHOS: 63 U/L (ref 38–126)
ALT: 50 U/L (ref 14–54)
AST: 39 U/L (ref 15–41)
Albumin: 3.8 g/dL (ref 3.5–5.0)
Anion gap: 6 (ref 5–15)
BILIRUBIN TOTAL: 0.9 mg/dL (ref 0.3–1.2)
BUN: 14 mg/dL (ref 6–20)
CALCIUM: 8.8 mg/dL — AB (ref 8.9–10.3)
CO2: 30 mmol/L (ref 22–32)
Chloride: 102 mmol/L (ref 101–111)
Creatinine, Ser: 0.84 mg/dL (ref 0.44–1.00)
GLUCOSE: 134 mg/dL — AB (ref 65–99)
Potassium: 3.9 mmol/L (ref 3.5–5.1)
Sodium: 138 mmol/L (ref 135–145)
TOTAL PROTEIN: 8.3 g/dL — AB (ref 6.5–8.1)

## 2016-03-24 LAB — URINALYSIS, ROUTINE W REFLEX MICROSCOPIC
BILIRUBIN URINE: NEGATIVE
Glucose, UA: NEGATIVE mg/dL
HGB URINE DIPSTICK: NEGATIVE
Ketones, ur: NEGATIVE mg/dL
Leukocytes, UA: NEGATIVE
Nitrite: NEGATIVE
Protein, ur: 30 mg/dL — AB
SPECIFIC GRAVITY, URINE: 1.024 (ref 1.005–1.030)
pH: 6 (ref 5.0–8.0)

## 2016-03-24 LAB — CBC WITH DIFFERENTIAL/PLATELET
BASOS ABS: 0.1 10*3/uL (ref 0.0–0.1)
BASOS PCT: 1 %
EOS ABS: 0.2 10*3/uL (ref 0.0–0.7)
Eosinophils Relative: 2 %
HEMATOCRIT: 45.7 % (ref 36.0–46.0)
HEMOGLOBIN: 13.2 g/dL (ref 12.0–15.0)
Lymphocytes Relative: 35 %
Lymphs Abs: 3.3 10*3/uL (ref 0.7–4.0)
MCH: 22.7 pg — ABNORMAL LOW (ref 26.0–34.0)
MCHC: 28.9 g/dL — AB (ref 30.0–36.0)
MCV: 78.7 fL (ref 78.0–100.0)
Monocytes Absolute: 0.6 10*3/uL (ref 0.1–1.0)
Monocytes Relative: 6 %
NEUTROS ABS: 5.4 10*3/uL (ref 1.7–7.7)
NEUTROS PCT: 56 %
Platelets: 234 10*3/uL (ref 150–400)
RBC: 5.81 MIL/uL — ABNORMAL HIGH (ref 3.87–5.11)
RDW: 18.2 % — ABNORMAL HIGH (ref 11.5–15.5)
WBC: 9.4 10*3/uL (ref 4.0–10.5)

## 2016-03-24 LAB — URINE MICROSCOPIC-ADD ON
RBC / HPF: NONE SEEN RBC/hpf (ref 0–5)
WBC, UA: NONE SEEN WBC/hpf (ref 0–5)

## 2016-03-24 LAB — I-STAT CG4 LACTIC ACID, ED: Lactic Acid, Venous: 1.05 mmol/L (ref 0.5–1.9)

## 2016-03-24 LAB — I-STAT TROPONIN, ED: TROPONIN I, POC: 0 ng/mL (ref 0.00–0.08)

## 2016-03-24 LAB — PROCALCITONIN

## 2016-03-24 LAB — BRAIN NATRIURETIC PEPTIDE: B Natriuretic Peptide: 114.4 pg/mL — ABNORMAL HIGH (ref 0.0–100.0)

## 2016-03-24 LAB — D-DIMER, QUANTITATIVE: D-Dimer, Quant: 0.91 ug/mL-FEU — ABNORMAL HIGH (ref 0.00–0.50)

## 2016-03-24 LAB — POC URINE PREG, ED: PREG TEST UR: NEGATIVE

## 2016-03-24 LAB — LIPASE, BLOOD: LIPASE: 19 U/L (ref 11–51)

## 2016-03-24 MED ORDER — ARIPIPRAZOLE 10 MG PO TABS
20.0000 mg | ORAL_TABLET | Freq: Every day | ORAL | Status: DC
  Administered 2016-03-25 – 2016-03-30 (×6): 20 mg via ORAL
  Filled 2016-03-24 (×6): qty 2

## 2016-03-24 MED ORDER — TIZANIDINE HCL 4 MG PO TABS: 4.0000 mg | ORAL_TABLET | Freq: Two times a day (BID) | ORAL | Status: DC | PRN

## 2016-03-24 MED ORDER — GABAPENTIN 300 MG PO CAPS
600.0000 mg | ORAL_CAPSULE | Freq: Every day | ORAL | Status: DC
  Administered 2016-03-24 – 2016-03-29 (×6): 600 mg via ORAL
  Filled 2016-03-24 (×6): qty 2

## 2016-03-24 MED ORDER — ONDANSETRON HCL 4 MG/2ML IJ SOLN: 4.0000 mg | Freq: Four times a day (QID) | INTRAMUSCULAR | Status: DC | PRN

## 2016-03-24 MED ORDER — QUETIAPINE FUMARATE 100 MG PO TABS
100.0000 mg | ORAL_TABLET | Freq: Every day | ORAL | Status: DC
  Administered 2016-03-25 – 2016-03-30 (×6): 100 mg via ORAL
  Filled 2016-03-24 (×6): qty 1

## 2016-03-24 MED ORDER — BISACODYL 5 MG PO TBEC: 5.0000 mg | DELAYED_RELEASE_TABLET | Freq: Every day | ORAL | Status: DC | PRN

## 2016-03-24 MED ORDER — ENOXAPARIN SODIUM 60 MG/0.6ML ~~LOC~~ SOLN
55.0000 mg | SUBCUTANEOUS | Status: DC
  Administered 2016-03-24 – 2016-03-29 (×6): 55 mg via SUBCUTANEOUS
  Filled 2016-03-24 (×6): qty 0.6

## 2016-03-24 MED ORDER — NICOTINE 14 MG/24HR TD PT24
14.0000 mg | MEDICATED_PATCH | Freq: Every day | TRANSDERMAL | Status: DC
  Administered 2016-03-25 – 2016-03-30 (×6): 14 mg via TRANSDERMAL
  Filled 2016-03-24 (×6): qty 1

## 2016-03-24 MED ORDER — POLYETHYLENE GLYCOL 3350 17 G PO PACK: 17.0000 g | PACK | Freq: Every day | ORAL | Status: DC | PRN

## 2016-03-24 MED ORDER — DEXTROSE 5 % IV SOLN
500.0000 mg | INTRAVENOUS | Status: DC
  Administered 2016-03-24 – 2016-03-26 (×3): 500 mg via INTRAVENOUS
  Filled 2016-03-24 (×3): qty 500

## 2016-03-24 MED ORDER — TRAZODONE HCL 100 MG PO TABS
100.0000 mg | ORAL_TABLET | Freq: Every day | ORAL | Status: DC
  Administered 2016-03-24 – 2016-03-29 (×6): 100 mg via ORAL
  Filled 2016-03-24 (×6): qty 1

## 2016-03-24 MED ORDER — FAMOTIDINE IN NACL 20-0.9 MG/50ML-% IV SOLN
20.0000 mg | Freq: Two times a day (BID) | INTRAVENOUS | Status: DC
  Administered 2016-03-24 – 2016-03-25 (×2): 20 mg via INTRAVENOUS
  Filled 2016-03-24 (×2): qty 50

## 2016-03-24 MED ORDER — ACETAMINOPHEN 325 MG PO TABS: 650.0000 mg | ORAL_TABLET | Freq: Four times a day (QID) | ORAL | Status: DC | PRN

## 2016-03-24 MED ORDER — IPRATROPIUM-ALBUTEROL 0.5-2.5 (3) MG/3ML IN SOLN: 3.0000 mL | RESPIRATORY_TRACT | Status: DC | PRN

## 2016-03-24 MED ORDER — IOPAMIDOL (ISOVUE-370) INJECTION 76%
100.0000 mL | Freq: Once | INTRAVENOUS | Status: AC | PRN
  Administered 2016-03-24: 100 mL via INTRAVENOUS

## 2016-03-24 MED ORDER — METHYLPREDNISOLONE SODIUM SUCC 125 MG IJ SOLR
60.0000 mg | Freq: Three times a day (TID) | INTRAMUSCULAR | Status: DC
  Administered 2016-03-24 – 2016-03-30 (×18): 60 mg via INTRAVENOUS
  Filled 2016-03-24 (×18): qty 2

## 2016-03-24 MED ORDER — ACETAMINOPHEN 650 MG RE SUPP: 650.0000 mg | Freq: Four times a day (QID) | RECTAL | Status: DC | PRN

## 2016-03-24 MED ORDER — BUPROPION HCL ER (SR) 150 MG PO TB12
150.0000 mg | ORAL_TABLET | Freq: Two times a day (BID) | ORAL | Status: DC
  Administered 2016-03-24 – 2016-03-30 (×12): 150 mg via ORAL
  Filled 2016-03-24 (×13): qty 1

## 2016-03-24 MED ORDER — ONDANSETRON HCL 4 MG PO TABS: 4.0000 mg | ORAL_TABLET | Freq: Four times a day (QID) | ORAL | Status: DC | PRN

## 2016-03-24 MED ORDER — GABAPENTIN 300 MG PO CAPS
300.0000 mg | ORAL_CAPSULE | Freq: Three times a day (TID) | ORAL | Status: DC
  Administered 2016-03-25 – 2016-03-30 (×17): 300 mg via ORAL
  Filled 2016-03-24 (×17): qty 1

## 2016-03-24 MED ORDER — QUETIAPINE FUMARATE 100 MG PO TABS
200.0000 mg | ORAL_TABLET | Freq: Every day | ORAL | Status: DC
  Administered 2016-03-24 – 2016-03-29 (×6): 200 mg via ORAL
  Filled 2016-03-24 (×6): qty 2

## 2016-03-24 MED ORDER — OXYCODONE-ACETAMINOPHEN 5-325 MG PO TABS
1.0000 | ORAL_TABLET | Freq: Four times a day (QID) | ORAL | Status: DC | PRN
  Administered 2016-03-25 – 2016-03-27 (×3): 1 via ORAL
  Filled 2016-03-24 (×3): qty 1

## 2016-03-24 NOTE — ED Notes (Signed)
FIRST ATTEMPT TO CALL REPORT TO 5E 1514-1.

## 2016-03-24 NOTE — ED Provider Notes (Signed)
[facility] DEPT Provider Note   CSN: [id number] Arrival date & time: 03/24/16  1459    History   Chief Complaint Chief Complaint  Patient presents with  . Shortness of Breath    HPI Diane Gilbert is a 50 y.o. female with a past medical history significant for interstitial lung disease status post partial wedge resection, hypertension, schizophrenia, and oxygen dependence with 2 L at baseline who presents with respiratory failure, shortness of breath, chest pain, and abdominal pain. Patient reports that for the last 2 weeks, she has had intermittent productive cough as well as shortness of breath. She says that this acutely worsened last night and, while at a group meeting today, went into respiratory distress. According to EMS report, fire found her oxygen saturation was 38%. After 2 L nasal cannula with EMS, the option saturation improved to 75%. At arrival after receiving Solu-Medrol and 2 DuoNeb treatments, her oxygen saturation is in the 90s. Patient reports that she is having severe epigastric and lower chest burning and pressure. She says this pain is new. She says it radiates to her right upper quadrant. She reports some associated nausea but no vomiting. She was given Tylenol by EMS for a temperature of 99.5. She denies history of DVT or PE. Patient also denies any constipation, diarrhea, or dysuria.  The history is provided by the patient and medical records. No language interpreter was used.  Shortness of Breath  This is a new problem. The average episode lasts 1 week. The problem occurs continuously.The current episode started yesterday (worsened yesterday). The problem has been gradually worsening. Associated symptoms include rhinorrhea, cough, sputum production, wheezing, chest pain and abdominal pain. Pertinent negatives include no fever, no headaches and no vomiting. She has tried beta-agonist inhalers for the symptoms. The treatment provided mild relief. Associated  medical issues include chronic lung disease.    Past Medical History:  Diagnosis Date  . Alcohol abuse   . Anxiety   . Arthritis   . Depression   . Headache(784.0)   . Hypertension   . Insomnia   . Interstitial lung disease ([place])   . Oxygen dependent   . Schizophrenia ([place])   . Shortness of breath dyspnea   . Sleep apnea     Patient Active Problem List   Diagnosis Date Noted  . Leg swelling 01/16/2016  . Dyspnea 09/06/2015  . Chronic respiratory failure ([place]) 06/07/2014  . Respiratory bronchiolitis interstitial lung disease ([place]) 12/26/2013  . BV (bacterial vaginosis) 01/23/2013  . Unspecified symptom associated with female genital organs 01/23/2013  . Irregular menstrual cycle 01/23/2013  . Smoking addiction 01/23/2013  . Alcohol abuse 02/19/2012  . Dehydration 02/19/2012  . Hyponatremia 02/19/2012  . Nausea vomiting and diarrhea 02/19/2012  . Pyelonephritis 02/19/2012  . Renal insufficiency 02/19/2012  . Abdominal pain 02/19/2012  . Hypokalemia 02/19/2012  . Leukocytosis 02/19/2012  . Anemia 02/19/2012  . Trichomoniasis 02/19/2012    Past Surgical History:  Procedure Laterality Date  . BACK SURGERY    . LUNG BIOPSY Left 07/16/2014   Procedure: LUNG BIOPSY;  Surgeon: Grace Isaac, MD;  Location: [facility];  Service: Thoracic;  Laterality: Left;  Marland Kitchen MULTIPLE TOOTH EXTRACTIONS    . VIDEO ASSISTED THORACOSCOPY Left 07/16/2014   Procedure: VIDEO ASSISTED THORACOSCOPY;  Surgeon: Grace Isaac, MD;  Location: [facility];  Service: Thoracic;  Laterality: Left;  Marland Kitchen VIDEO BRONCHOSCOPY Bilateral 04/11/2014   Procedure: VIDEO BRONCHOSCOPY WITH FLUORO;  Surgeon: Kathee Delton, MD;  Location: [facility];  Service: Cardiopulmonary;  Laterality: Bilateral;  . VIDEO BRONCHOSCOPY N/A 07/16/2014   Procedure: VIDEO BRONCHOSCOPY;  Surgeon: Grace Isaac, MD;  Location: [facility] OR;  Service: Thoracic;  Laterality: N/A;  . WEDGE RESECTION Left 07/16/2014   Procedure: WEDGE RESECTION;   Surgeon: Grace Isaac, MD;  Location: [facility];  Service: Thoracic;  Laterality: Left;  left upper lobe lung resection    OB History    Gravida Para Term Preterm AB Living   0 0 0 0 0 0   SAB TAB Ectopic Multiple Live Births   0 0 0 0         Home Medications    Prior to Admission medications   Medication Sig Start Date End Date Taking? Authorizing Provider  albuterol (PROVENTIL HFA;VENTOLIN HFA) 108 (90 BASE) MCG/ACT inhaler Inhale 2 puffs into the lungs every 6 (six) hours as needed for wheezing or shortness of breath. 05/09/15   Juanito Doom, MD  amLODipine (NORVASC) 10 MG tablet Take 1 tablet (10 mg total) by mouth daily. Patient taking differently: Take 20 mg by mouth daily.  07/19/14   Wayne E Gold, PA-C  ARIPiprazole (ABILIFY) 5 MG tablet Take 25 mg by mouth daily.     Historical Provider, MD  budesonide-formoterol (SYMBICORT) 160-4.5 MCG/ACT inhaler Inhale 2 puffs into the lungs 2 (two) times daily. 05/09/15   Juanito Doom, MD  buPROPion (WELLBUTRIN SR) 150 MG 12 hr tablet Take 150 mg by mouth 2 (two) times daily.    Historical Provider, MD  clotrimazole (LOTRIMIN) 1 % cream Apply 1 application topically 2 (two) times daily.    Historical Provider, MD  furosemide (LASIX) 20 MG tablet Take 1 tablet (20 mg total) by mouth daily. 09/06/15   Juanito Doom, MD  gabapentin (NEURONTIN) 100 MG capsule Take 100-400 mg by mouth 4 (four) times daily. Takes 100 mg 3 times daily and 400 mg at bedtime    Historical Provider, MD  oxyCODONE-acetaminophen (PERCOCET/ROXICET) 5-325 MG per tablet Take 1 tablet by mouth every 4 (four) hours as needed for moderate pain. As needed 07/19/14   John Giovanni, PA-C  QUEtiapine (SEROQUEL) 100 MG tablet Take 100 mg by mouth at bedtime.    Historical Provider, MD  tiZANidine (ZANAFLEX) 4 MG tablet Take 4 mg by mouth 2 (two) times daily as needed for muscle spasms.    Historical Provider, MD  traZODone (DESYREL) 150 MG tablet Take 150 mg by mouth at  bedtime.    Historical Provider, MD    Family History Family History  Problem Relation Age of Onset  . Breast cancer Mother   . COPD Maternal Aunt     Social History Social History  Substance Use Topics  . Smoking status: Former Smoker    Packs/day: 1.50    Years: 30.00    Types: Cigarettes  . Smokeless tobacco: Never Used     Comment: smoking 3 cigarettes daily "every now and then" 01/16/16  . Alcohol use 0.0 oz/week     Comment: Pt has previously abused alcohol , pt states she still drinks "every now and then"     Allergies   Review of patient's allergies indicates no known allergies.   Review of Systems Review of Systems  Constitutional: Positive for chills. Negative for fever.  HENT: Positive for congestion and rhinorrhea.   Eyes: Negative for visual disturbance.  Respiratory: Positive for cough, sputum production, chest tightness, shortness of breath and wheezing. Negative for stridor.  Cardiovascular: Positive for chest pain.  Gastrointestinal: Positive for abdominal pain. Negative for constipation, diarrhea, nausea and vomiting.  Genitourinary: Negative for dysuria.  Skin: Negative for wound.  Neurological: Negative for headaches.  All other systems reviewed and are negative.    Physical Exam Updated Vital Signs Pulse 84   Temp 98.2 F (36.8 C) (Axillary)   Ht _0  (1.626 m)   Wt 242 lb (109.8 kg)   LMP 03/02/2016   SpO2 97%   BMI 41.54 kg/m   Physical Exam  Constitutional: She appears well-developed and well-nourished. No distress.  HENT:  Head: Normocephalic and atraumatic.  Mouth/Throat: Oropharynx is clear and moist.  Eyes: Conjunctivae are normal.  Neck: Normal range of motion. Neck supple.  Cardiovascular: Normal rate and regular rhythm.   No murmur heard. Pulmonary/Chest: Effort normal. No stridor. No respiratory distress. She has wheezes. She exhibits no tenderness.  Abdominal: Soft. There is no tenderness.  Musculoskeletal: She  exhibits no edema or tenderness.  Neurological: She is alert.  Skin: Skin is warm and dry.  Psychiatric: She has a normal mood and affect.  Nursing note and vitals reviewed.    ED Treatments / Results  Labs (all labs ordered are listed, but only abnormal results are displayed) Labs Reviewed  CBC WITH DIFFERENTIAL/PLATELET - Abnormal; Notable for the following:       Result Value   RBC 5.81 (*)    MCH 22.7 (*)    MCHC 28.9 (*)    RDW 18.2 (*)    All other components within normal limits  COMPREHENSIVE METABOLIC PANEL - Abnormal; Notable for the following:    Glucose, Bld 134 (*)    Calcium 8.8 (*)    Total Protein 8.3 (*)    All other components within normal limits  URINALYSIS, ROUTINE W REFLEX MICROSCOPIC (NOT AT [facility]) - Abnormal; Notable for the following:    Color, Urine AMBER (*)    APPearance CLOUDY (*)    Protein, ur 30 (*)    All other components within normal limits  BRAIN NATRIURETIC PEPTIDE - Abnormal; Notable for the following:    B Natriuretic Peptide 114.4 (*)    All other components within normal limits  D-DIMER, QUANTITATIVE (NOT AT [facility]) - Abnormal; Notable for the following:    D-Dimer, Quant 0.91 (*)    All other components within normal limits  URINE MICROSCOPIC-ADD ON - Abnormal; Notable for the following:    Squamous Epithelial / LPF 6-30 (*)    Bacteria, UA RARE (*)    All other components within normal limits  BASIC METABOLIC PANEL - Abnormal; Notable for the following:    Glucose, Bld 121 (*)    All other components within normal limits  RESPIRATORY PANEL BY PCR  CULTURE, EXPECTORATED SPUTUM-ASSESSMENT  LIPASE, BLOOD  PROCALCITONIN  I-STAT CG4 LACTIC ACID, ED  I-STAT TROPOININ, ED  POC URINE PREG, ED    EKG  EKG Interpretation  Date/Time:  Tuesday March 24 2016 15:11:54 EDT Ventricular Rate:  83 PR Interval:    QRS Duration: 87 QT Interval:  383 QTC Calculation: 450 R Axis:   3 Text Interpretation:  Sinus rhythm Probable left  atrial enlargement Anterior infarct, old No significant change since last tracing Confirmed by KNAPP  MD-J, JON ([id number]) on 03/24/2016 3:17:24 PM       Radiology No results found.  Procedures Procedures (including critical care time)  Medications Ordered in ED Medications  ARIPiprazole (ABILIFY) tablet 20 mg (20 mg Oral Given 03/25/16  1011)  gabapentin (NEURONTIN) capsule 300 mg (300 mg Oral Given 03/25/16 0832)  nicotine (NICODERM CQ - dosed in mg/24 hours) patch 14 mg (14 mg Transdermal Patch Applied 03/25/16 1012)  QUEtiapine (SEROQUEL) tablet 100 mg (100 mg Oral Given 03/25/16 1011)  tiZANidine (ZANAFLEX) tablet 4 mg (not administered)  traZODone (DESYREL) tablet 100 mg (100 mg Oral Given 03/24/16 2210)  oxyCODONE-acetaminophen (PERCOCET/ROXICET) 5-325 MG per tablet 1 tablet (1 tablet Oral Given 03/25/16 0141)  buPROPion (WELLBUTRIN SR) 12 hr tablet 150 mg (150 mg Oral Given 03/25/16 1012)  enoxaparin (LOVENOX) injection 55 mg (55 mg Subcutaneous Given 03/24/16 2211)  acetaminophen (TYLENOL) tablet 650 mg (not administered)    Or  acetaminophen (TYLENOL) suppository 650 mg (not administered)  polyethylene glycol (MIRALAX / GLYCOLAX) packet 17 g (not administered)  bisacodyl (DULCOLAX) EC tablet 5 mg (not administered)  ondansetron (ZOFRAN) tablet 4 mg (not administered)    Or  ondansetron (ZOFRAN) injection 4 mg (not administered)  azithromycin (ZITHROMAX) 500 mg in dextrose 5 % 250 mL IVPB (500 mg Intravenous Given 03/24/16 2209)  methylPREDNISolone sodium succinate (SOLU-MEDROL) 125 mg/2 mL injection 60 mg (60 mg Intravenous Given 03/25/16 0615)  ipratropium-albuterol (DUONEB) 0.5-2.5 (3) MG/3ML nebulizer solution 3 mL (not administered)  gabapentin (NEURONTIN) capsule 600 mg (600 mg Oral Given 03/24/16 2210)  QUEtiapine (SEROQUEL) tablet 200 mg (200 mg Oral Given 03/24/16 2210)  docusate sodium (COLACE) capsule 100 mg (100 mg Oral Given 03/25/16 1011)  famotidine (PEPCID) tablet 20 mg  (not administered)  iopamidol (ISOVUE-370) 76 % injection 100 mL (100 mLs Intravenous Contrast Given 03/24/16 1711)  magnesium citrate solution 1 Bottle (1 Bottle Oral Given 03/25/16 1012)     Initial Impression / Assessment and Plan / ED Course  I have reviewed the triage vital signs and the nursing notes.  Pertinent labs & imaging results that were available during my care of the patient were reviewed by me and considered in my medical decision making (see chart for details).  Clinical Course    Diane Gilbert is a 50 y.o. female with a past medical history significant for interstitial lung disease status post partial wedge resection, hypertension, schizophrenia, and oxygen dependence with 2 L at baseline who presents with respiratory failure, shortness of breath, chest pain, and abdominal pain. History and exam are seen above.   Patient arrived with reported hypoxia. This had improved after arrival. Patient placed on breathing treatment on arrival. Wheezing improved. Patient had workup to look for cause including x-ray, lab testing, EKG. EKG did not show signs of acute ischemia. Initial troponin negative. D dimer was elevated, PE study ordered. No evidence of pulmonary embolism was seen however, Findings were present to suggest acute viral or atypical infection on top of chronic lung disease.  Given patient's increased oxygen requirement in the emergency department and diagnosis of pneumonia, patient was treated for community acquired pneumonia and will be admitted for further observation. Patient did not have further chest pain while in the ED. Patient admitted without further complication.   Final Clinical Impressions(s) / ED Diagnoses   Final diagnoses:  Hypoxia    New Prescriptions New Prescriptions   No medications on file    Clinical Impression: 1. Hypoxia     Disposition: Admit to Hospitalist service.    Gwenyth Allegra Tegeler, MD 03/25/16 1444

## 2016-03-24 NOTE — ED Notes (Signed)
Bed: RESA Expected date:  Expected time:  Means of arrival:  Comments: Copd respiratory distress

## 2016-03-24 NOTE — H&P (Signed)
History and Physical    SHEROLYN TRETTIN GUY:[id number] DOB: 05/24/1966 DOA: 03/24/2016  PCP: Philis Fendt, MD   Patient coming from: Home  Chief Complaint: Dyspnea, cough  HPI: Diane Gilbert is a 50 y.o. female with medical history significant for schizophrenia, hypertension, tobacco abuse, and chronic interstitial lung disease who presents the emergency department for evaluation of dyspnea and hypoxia at rest. She reports worsening in her chronic dyspnea and cough yesterday morning, states that it was initially tolerable, but progressively worsened throughout the day. This morning, her respiratory status had worsened further and she was dyspneic at rest. She went to her group therapy session this afternoon, but was too dyspneic to speak more than a couple words, and EMS was activated. Fire department reported her oxygen saturation to be in the 30s on their arrival despite her use of her usual 2 L/m of supplemental oxygen. Paramedics arrived shortly thereafter and documented a O2 saturation of 75%. Patient was placed on high flow oxygen and transported to the hospital with 2 duo nebs, 125 mg IV Solu-Medrol, and 1 g acetaminophen given en route. Patient notes some recent chills, but denies fevers per se. She denies chest pain or palpitations, and denies lower extremity edema or orthopnea. There has been no recent travel or sick contacts.  ED Course: Upon arrival to the ED, patient is found to be afebrile, saturating well on 15 L per minute of supplemental oxygen, with vitals otherwise stable. EKG demonstrates sinus rhythm with anterior Q waves and chest x-rays notable for small left pleural effusion and increased interstitial markings. CMP is largely unremarkable, CBC is entirely within the normal limits, lactic acid is reassuring at 1.05, troponin is undetectable, and BNP is slightly elevated at 114. Urinalysis is not suggestive of infection. Given the patient's profound hypoxia, d-dimer  was sent and returned elevated at 0.91. CTA PE study was performed and, while there is no PE, this study is notable for diffuse groundglass and intermittent streaky peribronchial opacity consistent with acute viral or atypical infection superimposed on her chronic interstitial lung disease. Patient remained hemodynamically stable in the emergency department and demonstrated some improvement, but continued to require 6 L/m of supplemental oxygen in order to maintain a saturation in the low 90s while at rest. She will be observed on the medical surgical unit for ongoing evaluation and management of acute on chronic hypoxic respiratory failure suspected secondary to acute lower respiratory tract infection.  Review of Systems:  All other systems reviewed and apart from HPI, are negative.  Past Medical History:  Diagnosis Date  . Alcohol abuse   . Anxiety   . Arthritis   . Depression   . Headache(784.0)   . Hypertension   . Insomnia   . Interstitial lung disease ([place])   . Oxygen dependent   . Schizophrenia ([place])   . Shortness of breath dyspnea   . Sleep apnea     Past Surgical History:  Procedure Laterality Date  . BACK SURGERY    . LUNG BIOPSY Left 07/16/2014   Procedure: LUNG BIOPSY;  Surgeon: Grace Isaac, MD;  Location: [facility];  Service: Thoracic;  Laterality: Left;  Marland Kitchen MULTIPLE TOOTH EXTRACTIONS    . VIDEO ASSISTED THORACOSCOPY Left 07/16/2014   Procedure: VIDEO ASSISTED THORACOSCOPY;  Surgeon: Grace Isaac, MD;  Location: [facility];  Service: Thoracic;  Laterality: Left;  Marland Kitchen VIDEO BRONCHOSCOPY Bilateral 04/11/2014   Procedure: VIDEO BRONCHOSCOPY WITH FLUORO;  Surgeon: Kathee Delton, MD;  Location:  [facility];  Service: Cardiopulmonary;  Laterality: Bilateral;  . VIDEO BRONCHOSCOPY N/A 07/16/2014   Procedure: VIDEO BRONCHOSCOPY;  Surgeon: Grace Isaac, MD;  Location: [facility] OR;  Service: Thoracic;  Laterality: N/A;  . WEDGE RESECTION Left 07/16/2014   Procedure: WEDGE RESECTION;   Surgeon: Grace Isaac, MD;  Location: [facility];  Service: Thoracic;  Laterality: Left;  left upper lobe lung resection     reports that she has quit smoking. Her smoking use included Cigarettes. She has a 45.00 pack-year smoking history. She has never used smokeless tobacco. She reports that she drinks alcohol. She reports that she does not use drugs.  No Known Allergies  Family History  Problem Relation Age of Onset  . Breast cancer Mother   . COPD Maternal Aunt      Prior to Admission medications   Medication Sig Start Date End Date Taking? Authorizing Provider  albuterol (PROVENTIL HFA;VENTOLIN HFA) 108 (90 BASE) MCG/ACT inhaler Inhale 2 puffs into the lungs every 6 (six) hours as needed for wheezing or shortness of breath. 05/09/15  Yes Juanito Doom, MD  amLODipine (NORVASC) 5 MG tablet Take 5 mg by mouth daily.   Yes Historical Provider, MD  ARIPiprazole (ABILIFY) 20 MG tablet Take 20 mg by mouth daily.   Yes Historical Provider, MD  buPROPion (WELLBUTRIN SR) 150 MG 12 hr tablet Take 150 mg by mouth 2 (two) times daily. Qam and q1200.   Yes Historical Provider, MD  gabapentin (NEURONTIN) 300 MG capsule Take 300-600 mg by mouth See admin instructions. Take 300 mg 3 times daily and 600 mg at bedtime.    Yes Historical Provider, MD  nicotine (NICODERM CQ - DOSED IN MG/24 HOURS) 14 mg/24hr patch Place 14 mg onto the skin daily. For 28 days, starting 03/19/16.   Yes Historical Provider, MD  oxyCODONE-acetaminophen (PERCOCET/ROXICET) 5-325 MG per tablet Take 1 tablet by mouth every 4 (four) hours as needed for moderate pain. As needed Patient taking differently: Take 1 tablet by mouth 2 (two) times daily as needed for moderate pain. As needed 07/19/14  Yes Wayne E Gold, PA-C  QUEtiapine (SEROQUEL) 100 MG tablet Take 100-200 mg by mouth See admin instructions. Take 100 mg every morning and 200 mg every evening.   Yes Historical Provider, MD  tiZANidine (ZANAFLEX) 4 MG tablet Take 4 mg by  mouth 2 (two) times daily as needed for muscle spasms. 01/31/16  Yes Historical Provider, MD  traZODone (DESYREL) 100 MG tablet Take 100 mg by mouth at bedtime.   Yes Historical Provider, MD  budesonide-formoterol (SYMBICORT) 160-4.5 MCG/ACT inhaler Inhale 2 puffs into the lungs 2 (two) times daily. Patient not taking: Reported on 03/24/2016 05/09/15   Juanito Doom, MD  furosemide (LASIX) 20 MG tablet Take 1 tablet (20 mg total) by mouth daily. Patient not taking: Reported on 03/24/2016 09/06/15   Juanito Doom, MD  nicotine (NICODERM CQ - DOSED IN MG/24 HR) 7 mg/24hr patch Place 7 mg onto the skin daily. Start after you finish 14 mg patches.    Historical Provider, MD    Physical Exam: Vitals:   03/24/16 1734 03/24/16 1819 03/24/16 1857 03/24/16 1941  BP: 155/78 110/91 157/89 97/73  Pulse: 78 81 84 85  Resp: _0 Temp:      TempSrc:      SpO2: 92% 92% 94% 95%  Weight:      Height:          Constitutional: In  mild respiratory distress with pauses between 3-4 words, calm, comfortable Eyes: PERTLA, lids and conjunctivae normal ENMT: Mucous membranes are moist. Posterior pharynx clear of any exudate or lesions.   Neck: normal, supple, no masses, no thyromegaly Respiratory: Subtle rhonchi at left mid-lung zones. Normal respiratory effort. No accessory muscle use.  Cardiovascular: S1 & S2 heard, regular rate and rhythm. No extremity edema. No significant JVD. Abdomen: No distension, no tenderness, no masses palpated. Bowel sounds normal.  Musculoskeletal: no clubbing / cyanosis. No joint deformity upper and lower extremities. Normal muscle tone.  Skin: no significant rashes, lesions, ulcers. Warm, dry, well-perfused. Neurologic: CN 2-12 grossly intact. Sensation intact, DTR normal. Strength 5/5 in all 4 limbs.  Psychiatric: Normal judgment and insight. Alert and oriented x 3. Normal mood and affect.      Labs on Admission: I have personally reviewed following labs and  imaging studies  CBC:  Recent Labs Lab 03/24/16 1539  WBC 9.4  NEUTROABS 5.4  HGB 13.2  HCT 45.7  MCV 78.7  PLT 423   Basic Metabolic Panel:  Recent Labs Lab 03/24/16 1539  NA 138  K 3.9  CL 102  CO2 30  GLUCOSE 134*  BUN 14  CREATININE 0.84  CALCIUM 8.8*   GFR: Estimated Creatinine Clearance: 100.3 mL/min (by C-G formula based on SCr of 0.84 mg/dL). Liver Function Tests:  Recent Labs Lab 03/24/16 1539  AST 39  ALT 50  ALKPHOS 63  BILITOT 0.9  PROT 8.3*  ALBUMIN 3.8    Recent Labs Lab 03/24/16 1539  LIPASE 19   No results for input(s): AMMONIA in the last 168 hours. Coagulation Profile: No results for input(s): INR, PROTIME in the last 168 hours. Cardiac Enzymes: No results for input(s): CKTOTAL, CKMB, CKMBINDEX, TROPONINI in the last 168 hours. BNP (last 3 results)  Recent Labs  09/06/15 1404  PROBNP 12.0   HbA1C: No results for input(s): HGBA1C in the last 72 hours. CBG: No results for input(s): GLUCAP in the last 168 hours. Lipid Profile: No results for input(s): CHOL, HDL, LDLCALC, TRIG, CHOLHDL, LDLDIRECT in the last 72 hours. Thyroid Function Tests: No results for input(s): TSH, T4TOTAL, FREET4, T3FREE, THYROIDAB in the last 72 hours. Anemia Panel: No results for input(s): VITAMINB12, FOLATE, FERRITIN, TIBC, IRON, RETICCTPCT in the last 72 hours. Urine analysis:    Component Value Date/Time   COLORURINE AMBER (A) 03/24/2016 1530   APPEARANCEUR CLOUDY (A) 03/24/2016 1530   LABSPEC 1.024 03/24/2016 1530   PHURINE 6.0 03/24/2016 1530   GLUCOSEU NEGATIVE 03/24/2016 1530   HGBUR NEGATIVE 03/24/2016 1530   BILIRUBINUR NEGATIVE 03/24/2016 1530   KETONESUR NEGATIVE 03/24/2016 1530   PROTEINUR 30 (A) 03/24/2016 1530   UROBILINOGEN 0.2 07/13/2014 1122   NITRITE NEGATIVE 03/24/2016 1530   LEUKOCYTESUR NEGATIVE 03/24/2016 1530   Sepsis Labs: _0 (procalcitonin:4,lacticidven:4) )No results found for this or any previous visit  (from the past 240 hour(s)).   Radiological Exams on Admission: Ct Angio Chest Pe W And/or Wo Contrast  Result Date: 03/24/2016 CLINICAL DATA:  49 year old female with productive cough, hypoxia, shortness of breath, epigastric pain for 2 weeks. Symptoms following flu shot. Abnormal D-dimer. Initial encounter. EXAM: CT ANGIOGRAPHY CHEST WITH CONTRAST TECHNIQUE: Multidetector CT imaging of the chest was performed using the standard protocol during bolus administration of intravenous contrast. Multiplanar CT image reconstructions and MIPs were obtained to evaluate the vascular anatomy. CONTRAST:  100 mL Isovue 370 COMPARISON:  Chest radiographs 1556 hours today. High-resolution chest CT 01/22/2014. FINDINGS: Cardiovascular:  Good contrast bolus timing in the pulmonary arterial tree. Intermittent respiratory motion artifact. No evidence of acute pulmonary embolus. Mild cardiomegaly. No pericardial effusion. Negative visualized aorta. Mediastinum/Nodes: Enlarged bilateral hilar lymph nodes, individually measuring up to 15 mm short axis. The comparison chest CT in 2015 was noncontrast, but this appears to be a new finding. Similar increased sub carina lymph nodes. Mildly increased right peritracheal lymph nodes up to 9 mm. Prevascular and anterior carina nodes have a more normal appearance. No axillary lymphadenopathy. Lungs/Pleura: Low lung volumes with atelectatic changes to the major airways. Diffuse bilateral pulmonary ground-glass opacity with intermittent superimposed more streaky and dense peribronchial opacity. Small or trace layering pleural effusions. No consolidation. Upper Abdomen: Negative visualized liver, spleen, left adrenal gland, and stomach in the upper abdomen. Musculoskeletal: No acute osseous abnormality identified. Review of the MIP images confirms the above findings. IMPRESSION: 1.  No evidence of acute pulmonary embolus. The low to intermediate density foci seen about the pulmonary arteries at  both hila appear related to (reactive) lymphadenopathy (see #4). 2. Low lung volumes with diffuse pulmonary ground-glass and intermittent more streaky peribronchial opacity. Underlying chronic pulmonary interstitial changes which were detailed on the 2015 high-resolution CT. 3. Small or trace layering pleural effusions. Mild cardiomegaly. No significant pericardial effusion. 4. Reactive appearing hilar and, to a lesser extent, mediastinal lymphadenopathy. 5. Overall the constellation of clinical and imaging findings suggests an Acute Viral or Atypical Respiratory Infection superimposed on a Chronic Lung Disease. Electronically Signed   By: Genevie Ann M.D.   On: 03/24/2016 17:34   Dg Chest Portable 1 View  Result Date: 03/24/2016 CLINICAL DATA:  Shortness of breath, cough, hypoxia, right chest pain EXAM: PORTABLE CHEST 1 VIEW COMPARISON:  09/06/2015 FINDINGS: Small left pleural effusion. Increased interstitial markings without frank interstitial edema. No pneumothorax. Cardiomegaly. IMPRESSION: Small left pleural effusion. Increased interstitial markings without frank interstitial edema. Electronically Signed   By: Julian Hy M.D.   On: 03/24/2016 16:16    EKG: Independently reviewed. Sinus rhythm, anterior Q-wave, no significant change from prior.  Assessment/Plan  1. Acute on chronic hypoxic respiratory failure - Requires 2 Lpm supplemental oxygen at baseline; was saturating 75% on 2 Lpm for EMS  - Given DuoNeb x2, 125 mg Solu-Medrol, and 1000 mg APAP en route  - Has been weaned down to 6 Lpm in the ED while at rest  - CTA neg for PE; findings consistent with acute viral or atypical infection superimposed on her chronic ILD - Check sputum culture and respiratory virus panel  - Treat with empiric azithromycin  - Role of steroids and DuoNeb not as clear for ILD, but there may be a component of COPD given the pt's significant smoking hx and she will be continued on systemic steroid with  Solu-Medrol 60 mg IV q8h and DuoNeb q4h prn  - Monitor with continuous pulse oximetry and titrate FiO2 to maintain sat low-mid 90's   2. Respiratory bronchiolitis-associated ILD  - Followed by [facility]   - Presented with significant respiratory sxs in 2005, underwent extensive w/u including inconclusive transbronchial bx and eventual VATS for wedge-biopsy in Jan 2016 with diagnosis of severe RB-ILD  - Cornerstone of tx is smoking-cessation; pt has cut-back dramatically, but has been difficult to quit - Nicotine patch provided  - Continue supplemental O2, wean as able   3. Schizophrenia  - Appears stable, pt denies active SI, HI, or hallucinations on admission  - Continue current management with Seroquel, Abilify, trazodone, Wellbutrin  DVT prophylaxis: sq Lovenox Code Status: Full  Family Communication: Discussed with patient Disposition Plan: Observe on med-surg Consults called: None Admission status: Observation    Vianne Bulls, MD [facility] Pager [phone]  If 7PM-7AM, please contact night-coverage [email or web address] Password [email or web address]  03/24/2016, 8:02 PM

## 2016-03-24 NOTE — ED Triage Notes (Addendum)
PT RECEIVED FROM HOME VIA EMS C/O PRODUCTIVE COUGH AND EPIGASTRIC PAIN X2 WEEKS SINCE RECEIVING THE FLU SHOT. PER EMS PT HAD INCREASING SOB SINCE LAST NIGHT. PT ON O2 AT 2L CONTINOUS. INITIAL O2 SAT 75% ON 2L. DUO-NEB GIVEN X2, SOLUMEDROL 125MG IV, AND TYLENOL 1000MG PO PTA.

## 2016-03-24 NOTE — Progress Notes (Signed)
[facility] consulted for medication needs.  Patient listed as having Medicaid [place] access insurance.  Pcp listed on patient's insurnace card is located at the [facility].  EDCM spoke to patient at bedside.  Patient reports her prescriptions cost three dollars.  She reports she doesn't have any income.  EDCM informed patient that she can ask her pharmacy for a copay waiver if she does not have the money to pay for her prescriptions.  Patient verbalized understanding.  No further EDCM needs at this time.

## 2016-03-24 NOTE — ED Notes (Signed)
WILL TRANSPORT PT TO THE FLOOR 5E 1514-1. AAOX4.PT IN NO APPARENT DISTRESS OR PAIN. THE OPPORTUNITY TO ASK QUESTIONS WAS PROVIDED.

## 2016-03-25 DIAGNOSIS — Z6841 Body Mass Index (BMI) 40.0 and over, adult: Secondary | ICD-10-CM | POA: Diagnosis not present

## 2016-03-25 DIAGNOSIS — J9621 Acute and chronic respiratory failure with hypoxia: Principal | ICD-10-CM

## 2016-03-25 DIAGNOSIS — R0902 Hypoxemia: Secondary | ICD-10-CM | POA: Diagnosis not present

## 2016-03-25 DIAGNOSIS — F203 Undifferentiated schizophrenia: Secondary | ICD-10-CM | POA: Diagnosis not present

## 2016-03-25 DIAGNOSIS — F419 Anxiety disorder, unspecified: Secondary | ICD-10-CM | POA: Diagnosis present

## 2016-03-25 DIAGNOSIS — G4730 Sleep apnea, unspecified: Secondary | ICD-10-CM | POA: Diagnosis present

## 2016-03-25 DIAGNOSIS — I509 Heart failure, unspecified: Secondary | ICD-10-CM | POA: Diagnosis not present

## 2016-03-25 DIAGNOSIS — K5900 Constipation, unspecified: Secondary | ICD-10-CM | POA: Diagnosis present

## 2016-03-25 DIAGNOSIS — I110 Hypertensive heart disease with heart failure: Secondary | ICD-10-CM | POA: Diagnosis present

## 2016-03-25 DIAGNOSIS — F329 Major depressive disorder, single episode, unspecified: Secondary | ICD-10-CM | POA: Diagnosis present

## 2016-03-25 DIAGNOSIS — Z79899 Other long term (current) drug therapy: Secondary | ICD-10-CM | POA: Diagnosis not present

## 2016-03-25 DIAGNOSIS — J84115 Respiratory bronchiolitis interstitial lung disease: Secondary | ICD-10-CM

## 2016-03-25 DIAGNOSIS — Z79891 Long term (current) use of opiate analgesic: Secondary | ICD-10-CM | POA: Diagnosis not present

## 2016-03-25 DIAGNOSIS — Z825 Family history of asthma and other chronic lower respiratory diseases: Secondary | ICD-10-CM | POA: Diagnosis not present

## 2016-03-25 DIAGNOSIS — Z87891 Personal history of nicotine dependence: Secondary | ICD-10-CM | POA: Diagnosis not present

## 2016-03-25 DIAGNOSIS — F209 Schizophrenia, unspecified: Secondary | ICD-10-CM | POA: Diagnosis present

## 2016-03-25 DIAGNOSIS — Z9981 Dependence on supplemental oxygen: Secondary | ICD-10-CM | POA: Diagnosis not present

## 2016-03-25 DIAGNOSIS — M1990 Unspecified osteoarthritis, unspecified site: Secondary | ICD-10-CM | POA: Diagnosis present

## 2016-03-25 DIAGNOSIS — I5030 Unspecified diastolic (congestive) heart failure: Secondary | ICD-10-CM | POA: Diagnosis present

## 2016-03-25 LAB — RESPIRATORY PANEL BY PCR
Adenovirus: NOT DETECTED
Bordetella pertussis: NOT DETECTED
CORONAVIRUS 229E-RVPPCR: NOT DETECTED
CORONAVIRUS HKU1-RVPPCR: NOT DETECTED
CORONAVIRUS OC43-RVPPCR: NOT DETECTED
Chlamydophila pneumoniae: NOT DETECTED
Coronavirus NL63: NOT DETECTED
INFLUENZA B-RVPPCR: NOT DETECTED
Influenza A: NOT DETECTED
MYCOPLASMA PNEUMONIAE-RVPPCR: NOT DETECTED
Metapneumovirus: NOT DETECTED
PARAINFLUENZA VIRUS 1-RVPPCR: NOT DETECTED
Parainfluenza Virus 2: NOT DETECTED
Parainfluenza Virus 3: NOT DETECTED
Parainfluenza Virus 4: NOT DETECTED
RESPIRATORY SYNCYTIAL VIRUS-RVPPCR: NOT DETECTED
Rhinovirus / Enterovirus: NOT DETECTED

## 2016-03-25 LAB — BASIC METABOLIC PANEL
ANION GAP: 5 (ref 5–15)
BUN: 11 mg/dL (ref 6–20)
CHLORIDE: 102 mmol/L (ref 101–111)
CO2: 31 mmol/L (ref 22–32)
Calcium: 9.1 mg/dL (ref 8.9–10.3)
Creatinine, Ser: 0.76 mg/dL (ref 0.44–1.00)
GFR calc Af Amer: >60 mL/min (ref >60)
GLUCOSE: 121 mg/dL — AB (ref 65–99)
POTASSIUM: 4.8 mmol/L (ref 3.5–5.1)
SODIUM: 138 mmol/L (ref 135–145)

## 2016-03-25 MED ORDER — FAMOTIDINE 20 MG PO TABS
20.0000 mg | ORAL_TABLET | Freq: Two times a day (BID) | ORAL | Status: DC
  Administered 2016-03-25 – 2016-03-30 (×10): 20 mg via ORAL
  Filled 2016-03-25 (×10): qty 1

## 2016-03-25 MED ORDER — DOCUSATE SODIUM 100 MG PO CAPS
100.0000 mg | ORAL_CAPSULE | Freq: Two times a day (BID) | ORAL | Status: DC
  Administered 2016-03-25 – 2016-03-30 (×11): 100 mg via ORAL
  Filled 2016-03-25 (×12): qty 1

## 2016-03-25 MED ORDER — MAGNESIUM CITRATE PO SOLN
1.0000 | Freq: Once | ORAL | Status: AC
  Administered 2016-03-25: 1 via ORAL
  Filled 2016-03-25: qty 296

## 2016-03-25 NOTE — Progress Notes (Signed)
Refused soap suds enema, MD notified.

## 2016-03-25 NOTE — Progress Notes (Signed)
Key Points: Use following P&T approved IV to PO non-antibiotic change policy.  Description contains the criteria that are approved Note: Policy Excludes:  Esophagectomy patientsPHARMACIST - PHYSICIAN COMMUNICATION DR:   Wyline Copas CONCERNING: IV to Oral Route Change Policy  RECOMMENDATION: This patient is receiving pepcid by the intravenous route.  Based on criteria approved by the Pharmacy and Therapeutics Committee, the intravenous medication(s) is/are being converted to the equivalent oral dose form(s).   DESCRIPTION: These criteria include:  The patient is eating (either orally or via tube) and/or has been taking other orally administered medications for a least 24 hours  The patient has no evidence of active gastrointestinal bleeding or impaired GI absorption (gastrectomy, short bowel, patient on TNA or NPO).  If you have questions about this conversion, please contact the Pharmacy Department  _0   [phone] )  [facility] _1   [phone] )  [facility]  _2   [phone] )  [facility] _3   [phone] )  [place], [place], [facility] 03/25/2016 10:39 AM

## 2016-03-25 NOTE — Progress Notes (Signed)
PROGRESS NOTE    PAULEEN GOLEMAN  MCE:[id number] DOB: 09-Aug-1965 DOA: 03/24/2016 PCP: Philis Fendt, MD    Brief Narrative:  Please review dictated H&P dated 03/24/2001 for details. Briefly patient is a 50 year old female with a history for schizophrenia, hypertension, tobacco abuse, chronic interstitial lung disease who presents emergency department with increased short of breath and hypoxia at rest.  Assessment & Plan:   Principal Problem:   Acute on chronic respiratory failure with hypoxia (HCC) Active Problems:   Smoking addiction   Respiratory bronchiolitis interstitial lung disease ([place])   Hypoxia   Schizophrenia ([place])  1. Acute on chronic respiratory failure, hypoxic 1. Will continue to wean O2 as tolerated 2. Continue bronchodilators, IV steroids 3. Patient remains O2 dependent up to 5 L this morning 2. Schizophrenia 1. Seems stable at this time 3. Depression/anxiety 1. Seems stable presently 4. Abdominal pain 1. Patient with increased generalized abdominal pain and increased distention this morning 2. Chest CT scan was personally reviewed by myself. Incidental constipation noted at a portion of the transverse/splenic flexure colon 3. This morning, patient was given a trial of magnesium citrate with no results 4. We'll give trial of soapsuds enema  DVT prophylaxis: Lovenox subcutaneous Code Status: Full code Family Communication: Patient in room, family not at bedside Disposition Plan: Uncertain at this time  Consultants:     Procedures:     Antimicrobials: Anti-infectives    Start     Dose/Rate Route Frequency Ordered Stop   03/24/16 2200  azithromycin (ZITHROMAX) 500 mg in dextrose 5 % 250 mL IVPB     500 mg 250 mL/hr over 60 Minutes Intravenous Every 24 hours 03/24/16 2002         Subjective: Complains of abdominal distention and generalized discomfort  Objective: Vitals:   03/24/16 2123 03/24/16 2135 03/25/16 0405 03/25/16 1438  BP:   (!) 157/82 (!) 145/99 (!) 144/84  Pulse:  81 74 81  Resp:  (!) _0 Temp: 98.2 F (36.8 C) 98.2 F (36.8 C) 98.6 F (37 C) 98.4 F (36.9 C)  TempSrc: Oral Oral Oral Oral  SpO2:  96% 96% 90%  Weight:      Height:        Intake/Output Summary (Last 24 hours) at 03/25/16 1728 Last data filed at 03/25/16 0850  Gross per 24 hour  Intake              300 ml  Output                4 ml  Net              296 ml   Filed Weights   03/24/16 1510  Weight: 109.8 kg (242 lb)    Examination:  General exam: Appears calm and Somewhat uncomfortable appearing  Respiratory system: Clear to auscultation. Respiratory effort normal. Cardiovascular system: S1 & S2 heard, RRR. Gastrointestinal system: Abdomen is mildly distended, generally tender without rebound Central nervous system: Alert and oriented. No focal neurological deficits. Extremities: Symmetric 5 x 5 power, perfused. Skin: No rashes, lesions normal skin turgor Psychiatry: Judgement and insight appear normal. Mood & affect appropriate.   Data Reviewed: I have personally reviewed following labs and imaging studies  CBC:  Recent Labs Lab 03/24/16 1539  WBC 9.4  NEUTROABS 5.4  HGB 13.2  HCT 45.7  MCV 78.7  PLT 449   Basic Metabolic Panel:  Recent Labs Lab 03/24/16 1539 03/25/16 0609  NA 138 138  K 3.9 4.8  CL 102 102  CO2 30 31  GLUCOSE 134* 121*  BUN 14 11  CREATININE 0.84 0.76  CALCIUM 8.8* 9.1   GFR: Estimated Creatinine Clearance: 105.3 mL/min (by C-G formula based on SCr of 0.76 mg/dL). Liver Function Tests:  Recent Labs Lab 03/24/16 1539  AST 39  ALT 50  ALKPHOS 63  BILITOT 0.9  PROT 8.3*  ALBUMIN 3.8    Recent Labs Lab 03/24/16 1539  LIPASE 19   No results for input(s): AMMONIA in the last 168 hours. Coagulation Profile: No results for input(s): INR, PROTIME in the last 168 hours. Cardiac Enzymes: No results for input(s): CKTOTAL, CKMB, CKMBINDEX, TROPONINI in the last 168  hours. BNP (last 3 results)  Recent Labs  09/06/15 1404  PROBNP 12.0   HbA1C: No results for input(s): HGBA1C in the last 72 hours. CBG: No results for input(s): GLUCAP in the last 168 hours. Lipid Profile: No results for input(s): CHOL, HDL, LDLCALC, TRIG, CHOLHDL, LDLDIRECT in the last 72 hours. Thyroid Function Tests: No results for input(s): TSH, T4TOTAL, FREET4, T3FREE, THYROIDAB in the last 72 hours. Anemia Panel: No results for input(s): VITAMINB12, FOLATE, FERRITIN, TIBC, IRON, RETICCTPCT in the last 72 hours. Sepsis Labs:  Recent Labs Lab 03/24/16 1539 03/24/16 1556  PROCALCITON <0.10  --   LATICACIDVEN  --  1.05    Recent Results (from the past 240 hour(s))  Respiratory Panel by PCR     Status: None   Collection Time: 03/25/16  1:58 AM  Result Value Ref Range Status   Adenovirus NOT DETECTED NOT DETECTED Final   Coronavirus 229E NOT DETECTED NOT DETECTED Final   Coronavirus HKU1 NOT DETECTED NOT DETECTED Final   Coronavirus NL63 NOT DETECTED NOT DETECTED Final   Coronavirus OC43 NOT DETECTED NOT DETECTED Final   Metapneumovirus NOT DETECTED NOT DETECTED Final   Rhinovirus / Enterovirus NOT DETECTED NOT DETECTED Final   Influenza A NOT DETECTED NOT DETECTED Final   Influenza B NOT DETECTED NOT DETECTED Final   Parainfluenza Virus 1 NOT DETECTED NOT DETECTED Final   Parainfluenza Virus 2 NOT DETECTED NOT DETECTED Final   Parainfluenza Virus 3 NOT DETECTED NOT DETECTED Final   Parainfluenza Virus 4 NOT DETECTED NOT DETECTED Final   Respiratory Syncytial Virus NOT DETECTED NOT DETECTED Final   Bordetella pertussis NOT DETECTED NOT DETECTED Final   Chlamydophila pneumoniae NOT DETECTED NOT DETECTED Final   Mycoplasma pneumoniae NOT DETECTED NOT DETECTED Final    Comment: Performed at [facility]     Radiology Studies: Ct Angio Chest Pe W And/or Wo Contrast  Result Date: 03/24/2016 CLINICAL DATA:  50 year old female with productive cough, hypoxia,  shortness of breath, epigastric pain for 2 weeks. Symptoms following flu shot. Abnormal D-dimer. Initial encounter. EXAM: CT ANGIOGRAPHY CHEST WITH CONTRAST TECHNIQUE: Multidetector CT imaging of the chest was performed using the standard protocol during bolus administration of intravenous contrast. Multiplanar CT image reconstructions and MIPs were obtained to evaluate the vascular anatomy. CONTRAST:  100 mL Isovue 370 COMPARISON:  Chest radiographs 1556 hours today. High-resolution chest CT 01/22/2014. FINDINGS: Cardiovascular: Good contrast bolus timing in the pulmonary arterial tree. Intermittent respiratory motion artifact. No evidence of acute pulmonary embolus. Mild cardiomegaly. No pericardial effusion. Negative visualized aorta. Mediastinum/Nodes: Enlarged bilateral hilar lymph nodes, individually measuring up to 15 mm short axis. The comparison chest CT in 2015 was noncontrast, but this appears to be a new finding. Similar increased sub carina lymph nodes. Mildly increased  right peritracheal lymph nodes up to 9 mm. Prevascular and anterior carina nodes have a more normal appearance. No axillary lymphadenopathy. Lungs/Pleura: Low lung volumes with atelectatic changes to the major airways. Diffuse bilateral pulmonary ground-glass opacity with intermittent superimposed more streaky and dense peribronchial opacity. Small or trace layering pleural effusions. No consolidation. Upper Abdomen: Negative visualized liver, spleen, left adrenal gland, and stomach in the upper abdomen. Musculoskeletal: No acute osseous abnormality identified. Review of the MIP images confirms the above findings. IMPRESSION: 1.  No evidence of acute pulmonary embolus. The low to intermediate density foci seen about the pulmonary arteries at both hila appear related to (reactive) lymphadenopathy (see #4). 2. Low lung volumes with diffuse pulmonary ground-glass and intermittent more streaky peribronchial opacity. Underlying chronic  pulmonary interstitial changes which were detailed on the 2015 high-resolution CT. 3. Small or trace layering pleural effusions. Mild cardiomegaly. No significant pericardial effusion. 4. Reactive appearing hilar and, to a lesser extent, mediastinal lymphadenopathy. 5. Overall the constellation of clinical and imaging findings suggests an Acute Viral or Atypical Respiratory Infection superimposed on a Chronic Lung Disease. Electronically Signed   By: Genevie Ann M.D.   On: 03/24/2016 17:34   Dg Chest Portable 1 View  Result Date: 03/24/2016 CLINICAL DATA:  Shortness of breath, cough, hypoxia, right chest pain EXAM: PORTABLE CHEST 1 VIEW COMPARISON:  09/06/2015 FINDINGS: Small left pleural effusion. Increased interstitial markings without frank interstitial edema. No pneumothorax. Cardiomegaly. IMPRESSION: Small left pleural effusion. Increased interstitial markings without frank interstitial edema. Electronically Signed   By: Julian Hy M.D.   On: 03/24/2016 16:16    Scheduled Meds: . ARIPiprazole  20 mg Oral Daily  . azithromycin  500 mg Intravenous Q24H  . buPROPion  150 mg Oral BID  . docusate sodium  100 mg Oral BID  . enoxaparin (LOVENOX) injection  55 mg Subcutaneous Q24H  . famotidine  20 mg Oral BID  . gabapentin  300 mg Oral TID WC  . gabapentin  600 mg Oral QHS  . methylPREDNISolone (SOLU-MEDROL) injection  60 mg Intravenous Q8H  . nicotine  14 mg Transdermal Daily  . QUEtiapine  100 mg Oral Daily  . QUEtiapine  200 mg Oral QHS  . traZODone  100 mg Oral QHS   Continuous Infusions:    LOS: 0 days   Calin Ellery, Orpah Melter, MD [facility] Pager [phone]  If 7PM-7AM, please contact night-coverage [email or web address] Password [facility] 03/25/2016, 5:28 PM

## 2016-03-26 LAB — PROCALCITONIN

## 2016-03-26 MED ORDER — MAGNESIUM CITRATE PO SOLN
1.0000 | Freq: Once | ORAL | Status: AC
  Administered 2016-03-26: 1 via ORAL
  Filled 2016-03-26: qty 296

## 2016-03-26 NOTE — Progress Notes (Signed)
PROGRESS NOTE    Diane Gilbert  UYQ:[id number] DOB: 1965/12/09 DOA: 03/24/2016 PCP: Philis Fendt, MD    Brief Narrative:  Please review dictated H&P dated 03/24/2001 for details. Briefly patient is a 50 year old female with a history for schizophrenia, hypertension, tobacco abuse, chronic interstitial lung disease who presents emergency department with increased short of breath and hypoxia at rest.  Assessment & Plan:   Principal Problem:   Acute on chronic respiratory failure with hypoxia (HCC) Active Problems:   Smoking addiction   Respiratory bronchiolitis interstitial lung disease ([place])   Hypoxia   Schizophrenia ([place])  1. Acute on chronic respiratory failure, hypoxic 1. Will continue to wean O2 as tolerated 2. Continue bronchodilators, IV steroids 3. She remains O2 dependent this morning. Wean O2 as tolerated 2. Schizophrenia 1. Appears to be stable today. 3. Depression/anxiety 1. Remain stable. Patient with good affect 4. Abdominal pain 1. Patient with increased generalized abdominal pain and increased distention this morning 2. Recent imaging revealed evidence of constipation 3. Today, patient's abdomen remains mildly distended 4. We'll give another dose of cathartic  DVT prophylaxis: Lovenox subcutaneous Code Status: Full code Family Communication: Patient in room, family not at bedside Disposition Plan: Uncertain at this time  Consultants:     Procedures:     Antimicrobials: Anti-infectives    Start     Dose/Rate Route Frequency Ordered Stop   03/24/16 2200  azithromycin (ZITHROMAX) 500 mg in dextrose 5 % 250 mL IVPB     500 mg 250 mL/hr over 60 Minutes Intravenous Every 24 hours 03/24/16 2002        Subjective: Feels better, abdomen still feels full  Objective: Vitals:   03/25/16 2131 03/26/16 1000 03/26/16 1230 03/26/16 1448  BP: 137/77   135/82  Pulse: 77   84  Resp: 18   18  Temp: 98.6 F (37 C)   98.3 F (36.8 C)  TempSrc:  Oral   Oral  SpO2: 100% 100% 94% 96%  Weight:      Height:        Intake/Output Summary (Last 24 hours) at 03/26/16 1827 Last data filed at 03/26/16 0600  Gross per 24 hour  Intake              250 ml  Output                0 ml  Net              250 ml   Filed Weights   03/24/16 1510  Weight: 109.8 kg (242 lb)    Examination:  General exam: Lying in bed, alert Respiratory system: Normal respiratory effort, no wheezing Cardiovascular system: Regular rate, regular rhythm, S1-S2 Gastrointestinal system: Positive bowel sounds, mildly distended Central nervous system: Cranial nerves II-12 grossly intact, strength and sensation intact Extremities: Perfused, no clubbing Skin: No Notable skin lesions seen, no rashes Psychiatry: Mood and affect normal, no auditory or visual hallucinations at this time  Data Reviewed: I have personally reviewed following labs and imaging studies  CBC:  Recent Labs Lab 03/24/16 1539  WBC 9.4  NEUTROABS 5.4  HGB 13.2  HCT 45.7  MCV 78.7  PLT 638   Basic Metabolic Panel:  Recent Labs Lab 03/24/16 1539 03/25/16 0609  NA 138 138  K 3.9 4.8  CL 102 102  CO2 30 31  GLUCOSE 134* 121*  BUN 14 11  CREATININE 0.84 0.76  CALCIUM 8.8* 9.1   GFR: Estimated Creatinine Clearance:  105.3 mL/min (by C-G formula based on SCr of 0.76 mg/dL). Liver Function Tests:  Recent Labs Lab 03/24/16 1539  AST 39  ALT 50  ALKPHOS 63  BILITOT 0.9  PROT 8.3*  ALBUMIN 3.8    Recent Labs Lab 03/24/16 1539  LIPASE 19   No results for input(s): AMMONIA in the last 168 hours. Coagulation Profile: No results for input(s): INR, PROTIME in the last 168 hours. Cardiac Enzymes: No results for input(s): CKTOTAL, CKMB, CKMBINDEX, TROPONINI in the last 168 hours. BNP (last 3 results)  Recent Labs  09/06/15 1404  PROBNP 12.0   HbA1C: No results for input(s): HGBA1C in the last 72 hours. CBG: No results for input(s): GLUCAP in the last 168  hours. Lipid Profile: No results for input(s): CHOL, HDL, LDLCALC, TRIG, CHOLHDL, LDLDIRECT in the last 72 hours. Thyroid Function Tests: No results for input(s): TSH, T4TOTAL, FREET4, T3FREE, THYROIDAB in the last 72 hours. Anemia Panel: No results for input(s): VITAMINB12, FOLATE, FERRITIN, TIBC, IRON, RETICCTPCT in the last 72 hours. Sepsis Labs:  Recent Labs Lab 03/24/16 1539 03/24/16 1556 03/26/16 0611  PROCALCITON <0.10  --  <0.10  LATICACIDVEN  --  1.05  --     Recent Results (from the past 240 hour(s))  Respiratory Panel by PCR     Status: None   Collection Time: 03/25/16  1:58 AM  Result Value Ref Range Status   Adenovirus NOT DETECTED NOT DETECTED Final   Coronavirus 229E NOT DETECTED NOT DETECTED Final   Coronavirus HKU1 NOT DETECTED NOT DETECTED Final   Coronavirus NL63 NOT DETECTED NOT DETECTED Final   Coronavirus OC43 NOT DETECTED NOT DETECTED Final   Metapneumovirus NOT DETECTED NOT DETECTED Final   Rhinovirus / Enterovirus NOT DETECTED NOT DETECTED Final   Influenza A NOT DETECTED NOT DETECTED Final   Influenza B NOT DETECTED NOT DETECTED Final   Parainfluenza Virus 1 NOT DETECTED NOT DETECTED Final   Parainfluenza Virus 2 NOT DETECTED NOT DETECTED Final   Parainfluenza Virus 3 NOT DETECTED NOT DETECTED Final   Parainfluenza Virus 4 NOT DETECTED NOT DETECTED Final   Respiratory Syncytial Virus NOT DETECTED NOT DETECTED Final   Bordetella pertussis NOT DETECTED NOT DETECTED Final   Chlamydophila pneumoniae NOT DETECTED NOT DETECTED Final   Mycoplasma pneumoniae NOT DETECTED NOT DETECTED Final    Comment: Performed at [facility]     Radiology Studies: No results found.  Scheduled Meds: . ARIPiprazole  20 mg Oral Daily  . azithromycin  500 mg Intravenous Q24H  . buPROPion  150 mg Oral BID  . docusate sodium  100 mg Oral BID  . enoxaparin (LOVENOX) injection  55 mg Subcutaneous Q24H  . famotidine  20 mg Oral BID  . gabapentin  300 mg Oral TID  WC  . gabapentin  600 mg Oral QHS  . methylPREDNISolone (SOLU-MEDROL) injection  60 mg Intravenous Q8H  . nicotine  14 mg Transdermal Daily  . QUEtiapine  100 mg Oral Daily  . QUEtiapine  200 mg Oral QHS  . traZODone  100 mg Oral QHS   Continuous Infusions:    LOS: 1 day   Shaely Gadberry, Orpah Melter, MD [facility] Pager [phone]  If 7PM-7AM, please contact night-coverage [email or web address] Password [facility] 03/26/2016, 6:27 PM

## 2016-03-27 LAB — GLUCOSE, CAPILLARY: Glucose-Capillary: 164 mg/dL — ABNORMAL HIGH (ref 65–99)

## 2016-03-27 MED ORDER — IPRATROPIUM-ALBUTEROL 0.5-2.5 (3) MG/3ML IN SOLN: 3.0000 mL | RESPIRATORY_TRACT | Status: DC | PRN

## 2016-03-27 MED ORDER — AZITHROMYCIN 250 MG PO TABS
500.0000 mg | ORAL_TABLET | Freq: Every day | ORAL | Status: DC
  Administered 2016-03-27 – 2016-03-29 (×3): 500 mg via ORAL
  Filled 2016-03-27 (×3): qty 2

## 2016-03-27 MED ORDER — IPRATROPIUM-ALBUTEROL 0.5-2.5 (3) MG/3ML IN SOLN
3.0000 mL | Freq: Four times a day (QID) | RESPIRATORY_TRACT | Status: DC
  Administered 2016-03-27 (×3): 3 mL via RESPIRATORY_TRACT
  Filled 2016-03-27 (×3): qty 3

## 2016-03-27 MED ORDER — IPRATROPIUM-ALBUTEROL 0.5-2.5 (3) MG/3ML IN SOLN
3.0000 mL | Freq: Three times a day (TID) | RESPIRATORY_TRACT | Status: DC
  Administered 2016-03-28 – 2016-03-30 (×8): 3 mL via RESPIRATORY_TRACT
  Filled 2016-03-27 (×8): qty 3

## 2016-03-27 NOTE — Progress Notes (Signed)
PROGRESS NOTE    Diane Gilbert  UUV:[id number] DOB: 1965/12/31 DOA: 03/24/2016 PCP: Diane Fendt, MD    Brief Narrative:  Please review dictated H&P dated 03/24/2001 for details. Briefly patient is a 50 year old female with a history for schizophrenia, hypertension, tobacco abuse, chronic interstitial lung disease who presents emergency department with increased short of breath and hypoxia at rest.  Assessment & Plan:   Principal Problem:   Acute on chronic respiratory failure with hypoxia (HCC) Active Problems:   Smoking addiction   Respiratory bronchiolitis interstitial lung disease ([place])   Hypoxia   Schizophrenia ([place])  1. Acute on chronic respiratory failure, hypoxic 1. Remains O2 dependent 2. duonebs currently PRN 3. Will schedule duonebs to q6hrs with q2hr PRN 4. continue IV steroids 5. She remains O2 dependent this morning. Wean O2 as tolerated 2. Schizophrenia 1. stable. 3. Depression/anxiety 4. stable.  5. Abdominal pain 1. Much improved with bowel regimen  DVT prophylaxis: Lovenox subcutaneous Code Status: Full code Family Communication: Patient in room, family not at bedside Disposition Plan: Uncertain at this time  Consultants:     Procedures:     Antimicrobials: Anti-infectives    Start     Dose/Rate Route Frequency Ordered Stop   03/27/16 2200  azithromycin (ZITHROMAX) tablet 500 mg     500 mg Oral Daily at bedtime 03/27/16 0811     03/24/16 2200  azithromycin (ZITHROMAX) 500 mg in dextrose 5 % 250 mL IVPB  Status:  Discontinued     500 mg 250 mL/hr over 60 Minutes Intravenous Every 24 hours 03/24/16 2002 03/27/16 0811      Subjective: No complaints  Objective: Vitals:   03/27/16 0831 03/27/16 1000 03/27/16 1413 03/27/16 1441  BP:   (!) 158/74   Pulse:   94   Resp:   20   Temp:   97.7 F (36.5 C)   TempSrc:   Oral   SpO2: 93% (!) 81% 92% 94%  Weight:      Height:        Intake/Output Summary (Last 24 hours) at 03/27/16  1735 Last data filed at 03/27/16 1230  Gross per 24 hour  Intake             1276 ml  Output                0 ml  Net             1276 ml   Filed Weights   03/24/16 1510  Weight: 109.8 kg (242 lb)    Examination:  General exam: Lying in bed, alert Respiratory system: Normal respiratory effort, no wheezing Cardiovascular system: Regular rate, regular rhythm, S1-S2 Gastrointestinal system: Positive bowel sounds, mildly distended Central nervous system: Cranial nerves II-12 grossly intact, strength and sensation intact Extremities: Perfused, no clubbing Skin: No Notable skin lesions seen, no rashes Psychiatry: Mood and affect normal, no auditory or visual hallucinations at this time  Data Reviewed: I have personally reviewed following labs and imaging studies  CBC:  Recent Labs Lab 03/24/16 1539  WBC 9.4  NEUTROABS 5.4  HGB 13.2  HCT 45.7  MCV 78.7  PLT 474   Basic Metabolic Panel:  Recent Labs Lab 03/24/16 1539 03/25/16 0609  NA 138 138  K 3.9 4.8  CL 102 102  CO2 30 31  GLUCOSE 134* 121*  BUN 14 11  CREATININE 0.84 0.76  CALCIUM 8.8* 9.1   GFR: Estimated Creatinine Clearance: 105.3 mL/min (by C-G formula based  on SCr of 0.76 mg/dL). Liver Function Tests:  Recent Labs Lab 03/24/16 1539  AST 39  ALT 50  ALKPHOS 63  BILITOT 0.9  PROT 8.3*  ALBUMIN 3.8    Recent Labs Lab 03/24/16 1539  LIPASE 19   No results for input(s): AMMONIA in the last 168 hours. Coagulation Profile: No results for input(s): INR, PROTIME in the last 168 hours. Cardiac Enzymes: No results for input(s): CKTOTAL, CKMB, CKMBINDEX, TROPONINI in the last 168 hours. BNP (last 3 results)  Recent Labs  09/06/15 1404  PROBNP 12.0   HbA1C: No results for input(s): HGBA1C in the last 72 hours. CBG:  Recent Labs Lab 03/27/16 0726  GLUCAP 164*   Lipid Profile: No results for input(s): CHOL, HDL, LDLCALC, TRIG, CHOLHDL, LDLDIRECT in the last 72 hours. Thyroid Function  Tests: No results for input(s): TSH, T4TOTAL, FREET4, T3FREE, THYROIDAB in the last 72 hours. Anemia Panel: No results for input(s): VITAMINB12, FOLATE, FERRITIN, TIBC, IRON, RETICCTPCT in the last 72 hours. Sepsis Labs:  Recent Labs Lab 03/24/16 1539 03/24/16 1556 03/26/16 0611  PROCALCITON <0.10  --  <0.10  LATICACIDVEN  --  1.05  --     Recent Results (from the past 240 hour(s))  Respiratory Panel by PCR     Status: None   Collection Time: 03/25/16  1:58 AM  Result Value Ref Range Status   Adenovirus NOT DETECTED NOT DETECTED Final   Coronavirus 229E NOT DETECTED NOT DETECTED Final   Coronavirus HKU1 NOT DETECTED NOT DETECTED Final   Coronavirus NL63 NOT DETECTED NOT DETECTED Final   Coronavirus OC43 NOT DETECTED NOT DETECTED Final   Metapneumovirus NOT DETECTED NOT DETECTED Final   Rhinovirus / Enterovirus NOT DETECTED NOT DETECTED Final   Influenza A NOT DETECTED NOT DETECTED Final   Influenza B NOT DETECTED NOT DETECTED Final   Parainfluenza Virus 1 NOT DETECTED NOT DETECTED Final   Parainfluenza Virus 2 NOT DETECTED NOT DETECTED Final   Parainfluenza Virus 3 NOT DETECTED NOT DETECTED Final   Parainfluenza Virus 4 NOT DETECTED NOT DETECTED Final   Respiratory Syncytial Virus NOT DETECTED NOT DETECTED Final   Bordetella pertussis NOT DETECTED NOT DETECTED Final   Chlamydophila pneumoniae NOT DETECTED NOT DETECTED Final   Mycoplasma pneumoniae NOT DETECTED NOT DETECTED Final    Comment: Performed at [facility]     Radiology Studies: No results found.  Scheduled Meds: . ARIPiprazole  20 mg Oral Daily  . azithromycin  500 mg Oral QHS  . buPROPion  150 mg Oral BID  . docusate sodium  100 mg Oral BID  . enoxaparin (LOVENOX) injection  55 mg Subcutaneous Q24H  . famotidine  20 mg Oral BID  . gabapentin  300 mg Oral TID WC  . gabapentin  600 mg Oral QHS  . ipratropium-albuterol  3 mL Nebulization Q6H  . methylPREDNISolone (SOLU-MEDROL) injection  60 mg  Intravenous Q8H  . nicotine  14 mg Transdermal Daily  . QUEtiapine  100 mg Oral Daily  . QUEtiapine  200 mg Oral QHS  . traZODone  100 mg Oral QHS   Continuous Infusions:    LOS: 2 days   Aahna Rossa, Orpah Melter, MD [facility] Pager [phone]  If 7PM-7AM, please contact night-coverage [email or web address] Password [facility] 03/27/2016, 5:35 PM

## 2016-03-27 NOTE — Progress Notes (Signed)
PHARMACIST - PHYSICIAN COMMUNICATION DR:   Wyline Copas CONCERNING: Antibiotic IV to Oral Route Change Policy  RECOMMENDATION: This patient is receiving Azithromycin by the intravenous route.  Based on criteria approved by the Pharmacy and Therapeutics Committee, the antibiotic(s) is/are being converted to the equivalent oral dose form(s).   DESCRIPTION: These criteria include:  Patient being treated for a respiratory tract infection, urinary tract infection, cellulitis or clostridium difficile associated diarrhea if on metronidazole  The patient is not neutropenic and does not exhibit a GI malabsorption state  The patient is eating (either orally or via tube) and/or has been taking other orally administered medications for a least 24 hours  The patient is improving clinically and has a Tmax < 100.5  If you have questions about this conversion, please contact the Pharmacy Department  _0   [phone] )  [facility] _1   [phone] )  [facility] _2   [phone] )  [facility] _3   [phone] )  [facility] _4   [phone] )  [facility]

## 2016-03-27 NOTE — Progress Notes (Signed)
SATURATION QUALIFICATIONS: (This note is used to comply with regulatory documentation for home oxygen)  Patient Saturations on Room Air at Rest = 93%  Patient Saturations on Room Air while Ambulating =   Patient Saturations on 2 Liters of oxygen while Ambulating = 81%  Please briefly explain why patient needs home oxygen:

## 2016-03-28 LAB — PROCALCITONIN: Procalcitonin: <0.1 ng/mL

## 2016-03-28 LAB — GLUCOSE, CAPILLARY: Glucose-Capillary: 258 mg/dL — ABNORMAL HIGH (ref 65–99)

## 2016-03-28 MED ORDER — FUROSEMIDE 10 MG/ML IJ SOLN
40.0000 mg | Freq: Two times a day (BID) | INTRAMUSCULAR | Status: DC
  Administered 2016-03-28 – 2016-03-30 (×4): 40 mg via INTRAVENOUS
  Filled 2016-03-28 (×4): qty 4

## 2016-03-28 NOTE — Progress Notes (Signed)
Pt CBG 258 this morning AFTER breakfast of pancakes, omelet, cereal, bagel, OJ, and milk.

## 2016-03-28 NOTE — Progress Notes (Signed)
Patient just had an unwitnessed fall. Patient was trying to ambulate to restroom by herself and states that both of her legs gave out. Patient found sitting on floor by nurse tech, Morene Crocker. RN notified to come to room. Patient states "It's my fault, not y'all's". Both RN and NT instructed patient on the importance of calling staff before trying to ambulate. Patient verbalizes understanding. Patient denies having any pain, no injuries noted. Patient alert and oriented. Provider on call notified. Will continue to monitor.

## 2016-03-28 NOTE — Progress Notes (Signed)
PROGRESS NOTE    Diane Gilbert  IWP:[id number] DOB: 1965/11/18 DOA: 03/24/2016 PCP: Philis Fendt, MD    Brief Narrative:  Please review dictated H&P dated 03/24/2001 for details. Briefly patient is a 50 year old female with a history for schizophrenia, hypertension, tobacco abuse, chronic interstitial lung disease who presents emergency department with increased short of breath and hypoxia at rest.  Assessment & Plan:   Principal Problem:   Acute on chronic respiratory failure with hypoxia (HCC) Active Problems:   Smoking addiction   Respiratory bronchiolitis interstitial lung disease ([place])   Hypoxia   Schizophrenia ([place])  1. Acute on chronic respiratory failure, hypoxic 1. Noted to be hypoxic on ambulation recently. Patient remains on 2 L nasal cannula (her baseline)  2. Plan to continue with scheduled DuoNeb's 3. continue IV steroids 4. She remains O2 dependent this morning. Wean O2 as tolerated 2. Schizophrenia 1. stable. 3. Depression/anxiety 4. stable.  5. Abdominal pain 1. Much improved with bowel regimen 6. Suspected cutely decompensated diastolic congestive heart failure, chronicity is uncertain 1. Presenting chest imaging did demonstrate some cardio megaly 2. BNP was mildly elevated 3. We'll start patient on trial of IV Lasix 4. Will order 2-D echocardiogram  DVT prophylaxis: Lovenox subcutaneous Code Status: Full code Family Communication: Patient in room, family not at bedside Disposition Plan: Uncertain at this time  Consultants:     Procedures:     Antimicrobials: Anti-infectives    Start     Dose/Rate Route Frequency Ordered Stop   03/27/16 2200  azithromycin (ZITHROMAX) tablet 500 mg     500 mg Oral Daily at bedtime 03/27/16 0811     03/24/16 2200  azithromycin (ZITHROMAX) 500 mg in dextrose 5 % 250 mL IVPB  Status:  Discontinued     500 mg 250 mL/hr over 60 Minutes Intravenous Every 24 hours 03/24/16 2002 03/27/16 0811       Subjective: Patient without complaints currently.  Objective: Vitals:   03/28/16 0545 03/28/16 0802 03/28/16 1400 03/28/16 1528  BP: (!) 147/75   (!) 176/81  Pulse: 82   89  Resp: 16   19  Temp: 98.4 F (36.9 C)   97.7 F (36.5 C)  TempSrc: Oral   Oral  SpO2: 96% 97% 96% 96%  Weight:      Height:        Intake/Output Summary (Last 24 hours) at 03/28/16 1835 Last data filed at 03/28/16 0547  Gross per 24 hour  Intake             1260 ml  Output                0 ml  Net             1260 ml   Filed Weights   03/24/16 1510  Weight: 109.8 kg (242 lb)    Examination:  General exam: Sitting up in bed, conversant Respiratory system: Good chest rise, no end expiratory wheezing Cardiovascular system: Regular rhythm, S1-S2, difficult to assess JVD Gastrointestinal system: Morbidly obese, positive bowel sounds Central nervous system: No tremors, no seizures Extremities: No cyanosis, no joint deformities Skin: No pallor, skin turgor normal Psychiatry: Normal mood, no auditory hallucinations  Data Reviewed: I have personally reviewed following labs and imaging studies  CBC:  Recent Labs Lab 03/24/16 1539  WBC 9.4  NEUTROABS 5.4  HGB 13.2  HCT 45.7  MCV 78.7  PLT 505   Basic Metabolic Panel:  Recent Labs Lab 03/24/16 1539 03/25/16  0609  NA 138 138  K 3.9 4.8  CL 102 102  CO2 30 31  GLUCOSE 134* 121*  BUN 14 11  CREATININE 0.84 0.76  CALCIUM 8.8* 9.1   GFR: Estimated Creatinine Clearance: 105.3 mL/min (by C-G formula based on SCr of 0.76 mg/dL). Liver Function Tests:  Recent Labs Lab 03/24/16 1539  AST 39  ALT 50  ALKPHOS 63  BILITOT 0.9  PROT 8.3*  ALBUMIN 3.8    Recent Labs Lab 03/24/16 1539  LIPASE 19   No results for input(s): AMMONIA in the last 168 hours. Coagulation Profile: No results for input(s): INR, PROTIME in the last 168 hours. Cardiac Enzymes: No results for input(s): CKTOTAL, CKMB, CKMBINDEX, TROPONINI in the last 168  hours. BNP (last 3 results)  Recent Labs  09/06/15 1404  PROBNP 12.0   HbA1C: No results for input(s): HGBA1C in the last 72 hours. CBG:  Recent Labs Lab 03/27/16 0726 03/28/16 0839  GLUCAP 164* 258*   Lipid Profile: No results for input(s): CHOL, HDL, LDLCALC, TRIG, CHOLHDL, LDLDIRECT in the last 72 hours. Thyroid Function Tests: No results for input(s): TSH, T4TOTAL, FREET4, T3FREE, THYROIDAB in the last 72 hours. Anemia Panel: No results for input(s): VITAMINB12, FOLATE, FERRITIN, TIBC, IRON, RETICCTPCT in the last 72 hours. Sepsis Labs:  Recent Labs Lab 03/24/16 1539 03/24/16 1556 03/26/16 0611 03/28/16 0551  PROCALCITON <0.10  --  <0.10 <0.10  LATICACIDVEN  --  1.05  --   --     Recent Results (from the past 240 hour(s))  Respiratory Panel by PCR     Status: None   Collection Time: 03/25/16  1:58 AM  Result Value Ref Range Status   Adenovirus NOT DETECTED NOT DETECTED Final   Coronavirus 229E NOT DETECTED NOT DETECTED Final   Coronavirus HKU1 NOT DETECTED NOT DETECTED Final   Coronavirus NL63 NOT DETECTED NOT DETECTED Final   Coronavirus OC43 NOT DETECTED NOT DETECTED Final   Metapneumovirus NOT DETECTED NOT DETECTED Final   Rhinovirus / Enterovirus NOT DETECTED NOT DETECTED Final   Influenza A NOT DETECTED NOT DETECTED Final   Influenza B NOT DETECTED NOT DETECTED Final   Parainfluenza Virus 1 NOT DETECTED NOT DETECTED Final   Parainfluenza Virus 2 NOT DETECTED NOT DETECTED Final   Parainfluenza Virus 3 NOT DETECTED NOT DETECTED Final   Parainfluenza Virus 4 NOT DETECTED NOT DETECTED Final   Respiratory Syncytial Virus NOT DETECTED NOT DETECTED Final   Bordetella pertussis NOT DETECTED NOT DETECTED Final   Chlamydophila pneumoniae NOT DETECTED NOT DETECTED Final   Mycoplasma pneumoniae NOT DETECTED NOT DETECTED Final    Comment: Performed at [facility]     Radiology Studies: No results found.  Scheduled Meds: . ARIPiprazole  20 mg Oral  Daily  . azithromycin  500 mg Oral QHS  . buPROPion  150 mg Oral BID  . docusate sodium  100 mg Oral BID  . enoxaparin (LOVENOX) injection  55 mg Subcutaneous Q24H  . famotidine  20 mg Oral BID  . furosemide  40 mg Intravenous BID  . gabapentin  300 mg Oral TID WC  . gabapentin  600 mg Oral QHS  . ipratropium-albuterol  3 mL Nebulization TID  . methylPREDNISolone (SOLU-MEDROL) injection  60 mg Intravenous Q8H  . nicotine  14 mg Transdermal Daily  . QUEtiapine  100 mg Oral Daily  . QUEtiapine  200 mg Oral QHS  . traZODone  100 mg Oral QHS   Continuous Infusions:  LOS: 3 days   Gill Delrossi, Orpah Melter, MD [facility] Pager [phone]  If 7PM-7AM, please contact night-coverage [email or web address] Password [facility] 03/28/2016, 6:35 PM

## 2016-03-29 ENCOUNTER — Inpatient Hospital Stay (HOSPITAL_COMMUNITY): Payer: Medicaid Other

## 2016-03-29 DIAGNOSIS — I509 Heart failure, unspecified: Secondary | ICD-10-CM

## 2016-03-29 LAB — BASIC METABOLIC PANEL
ANION GAP: 9 (ref 5–15)
BUN: 16 mg/dL (ref 6–20)
CO2: 34 mmol/L — ABNORMAL HIGH (ref 22–32)
Calcium: 9.4 mg/dL (ref 8.9–10.3)
Chloride: 93 mmol/L — ABNORMAL LOW (ref 101–111)
Creatinine, Ser: 0.71 mg/dL (ref 0.44–1.00)
GLUCOSE: 109 mg/dL — AB (ref 65–99)
POTASSIUM: 4 mmol/L (ref 3.5–5.1)
Sodium: 136 mmol/L (ref 135–145)

## 2016-03-29 LAB — ECHOCARDIOGRAM COMPLETE
Height: 64 in
Weight: 3872 oz

## 2016-03-29 LAB — GLUCOSE, CAPILLARY: GLUCOSE-CAPILLARY: 127 mg/dL — AB (ref 65–99)

## 2016-03-29 LAB — BRAIN NATRIURETIC PEPTIDE: B NATRIURETIC PEPTIDE 5: 92.5 pg/mL (ref 0.0–100.0)

## 2016-03-29 NOTE — Progress Notes (Signed)
  Echocardiogram 2D Echocardiogram has been performed.  Diane Gilbert 03/29/2016, 2:28 PM

## 2016-03-29 NOTE — Progress Notes (Signed)
PROGRESS NOTE    Diane Gilbert  WCB:[id number] DOB: [birth year] DOA: 03/24/2016 PCP: Philis Fendt, MD    Brief Narrative:  Please review dictated H&P dated 03/24/2001 for details. Briefly patient is a 50 year old female with a history for schizophrenia, hypertension, tobacco abuse, chronic interstitial lung disease who presents emergency department with increased short of breath and hypoxia at rest.  Assessment & Plan:   Principal Problem:   Acute on chronic respiratory failure with hypoxia (HCC) Active Problems:   Smoking addiction   Respiratory bronchiolitis interstitial lung disease ([place])   Hypoxia   Schizophrenia ([place])  1. Acute on chronic respiratory failure, hypoxic 1. Noted to be hypoxic on ambulation recently. Patient presently is on 2 L nasal cannula (her baseline O2 requirements)  2. Patient is continued with scheduled DuoNeb's 3. Plan to continue IV steroids 4. Patient with subjective shortness of breath 5. Patient currently on Lasix (see below) 2. Schizophrenia 1. stable thus far. 3. Depression/anxiety 4. stable thus far.  5. Abdominal pain 1. Much improved since receiving bowel regimen 6. Suspected cutely decompensated diastolic congestive heart failure, chronicity is uncertain 1. Appeared more short of breath this morning 2. Presenting chest imaging did demonstrate some cardio megaly 3. Repeat chest x-ray was obtained and personally reviewed by myself. Evidence of cardiomegaly with interstitial edema noted 4. Patient is continued on IV Lasix 5. 2-D echocardiogram has been obtained, results reviewed with evidence of grade 2 diastolic dysfunction and normal LVEF 6. We'll place patient on 1500 mL fluid restricted diet  DVT prophylaxis: Lovenox subcutaneous Code Status: Full code Family Communication: Patient in room, family not at bedside Disposition Plan: Uncertain at this time  Consultants:     Procedures:      Antimicrobials: Anti-infectives    Start     Dose/Rate Route Frequency Ordered Stop   03/27/16 2200  azithromycin (ZITHROMAX) tablet 500 mg     500 mg Oral Daily at bedtime 03/27/16 0811     03/24/16 2200  azithromycin (ZITHROMAX) 500 mg in dextrose 5 % 250 mL IVPB  Status:  Discontinued     500 mg 250 mL/hr over 60 Minutes Intravenous Every 24 hours 03/24/16 2002 03/27/16 0811      Subjective: Patient denies complaints to me this morning  Objective: Vitals:   03/29/16 0508 03/29/16 0830 03/29/16 1426 03/29/16 1458  BP: (!) 154/89   (!) 155/73  Pulse: 84   88  Resp: 18   17  Temp: 98 F (36.7 C)   97.6 F (36.4 C)  TempSrc: Oral   Oral  SpO2: 93% 95% 91% 96%  Weight:      Height:        Intake/Output Summary (Last 24 hours) at 03/29/16 1612 Last data filed at 03/29/16 0919  Gross per 24 hour  Intake              840 ml  Output                0 ml  Net              840 ml   Filed Weights   03/24/16 1510  Weight: 109.8 kg (242 lb)    Examination:  General exam: Lying in bed, no acute distress Respiratory system: Mildly increased respiratory effort, no active wheezing Cardiovascular system: Regular rate, S1-S2 Gastrointestinal system: Nontender, nondistended Central nervous system: Cranial nerves 2-12 grossly intact, sensation intact Extremities: No clubbing, perfused Skin: No Notable skin lesions seen, no  rashes  Psychiatry: Normal affect, no visual hallucinations  Data Reviewed: I have personally reviewed following labs and imaging studies  CBC:  Recent Labs Lab 03/24/16 1539  WBC 9.4  NEUTROABS 5.4  HGB 13.2  HCT 45.7  MCV 78.7  PLT 381   Basic Metabolic Panel:  Recent Labs Lab 03/24/16 1539 03/25/16 0609 03/29/16 0525  NA 138 138 136  K 3.9 4.8 4.0  CL 102 102 93*  CO2 30 31 34*  GLUCOSE 134* 121* 109*  BUN _0 CREATININE 0.84 0.76 0.71  CALCIUM 8.8* 9.1 9.4   GFR: Estimated Creatinine Clearance: 105.3 mL/min (by C-G  formula based on SCr of 0.71 mg/dL). Liver Function Tests:  Recent Labs Lab 03/24/16 1539  AST 39  ALT 50  ALKPHOS 63  BILITOT 0.9  PROT 8.3*  ALBUMIN 3.8    Recent Labs Lab 03/24/16 1539  LIPASE 19   No results for input(s): AMMONIA in the last 168 hours. Coagulation Profile: No results for input(s): INR, PROTIME in the last 168 hours. Cardiac Enzymes: No results for input(s): CKTOTAL, CKMB, CKMBINDEX, TROPONINI in the last 168 hours. BNP (last 3 results)  Recent Labs  09/06/15 1404  PROBNP 12.0   HbA1C: No results for input(s): HGBA1C in the last 72 hours. CBG:  Recent Labs Lab 03/27/16 0726 03/28/16 0839 03/29/16 0736  GLUCAP 164* 258* 127*   Lipid Profile: No results for input(s): CHOL, HDL, LDLCALC, TRIG, CHOLHDL, LDLDIRECT in the last 72 hours. Thyroid Function Tests: No results for input(s): TSH, T4TOTAL, FREET4, T3FREE, THYROIDAB in the last 72 hours. Anemia Panel: No results for input(s): VITAMINB12, FOLATE, FERRITIN, TIBC, IRON, RETICCTPCT in the last 72 hours. Sepsis Labs:  Recent Labs Lab 03/24/16 1539 03/24/16 1556 03/26/16 0611 03/28/16 0551  PROCALCITON <0.10  --  <0.10 <0.10  LATICACIDVEN  --  1.05  --   --     Recent Results (from the past 240 hour(s))  Respiratory Panel by PCR     Status: None   Collection Time: 03/25/16  1:58 AM  Result Value Ref Range Status   Adenovirus NOT DETECTED NOT DETECTED Final   Coronavirus 229E NOT DETECTED NOT DETECTED Final   Coronavirus HKU1 NOT DETECTED NOT DETECTED Final   Coronavirus NL63 NOT DETECTED NOT DETECTED Final   Coronavirus OC43 NOT DETECTED NOT DETECTED Final   Metapneumovirus NOT DETECTED NOT DETECTED Final   Rhinovirus / Enterovirus NOT DETECTED NOT DETECTED Final   Influenza A NOT DETECTED NOT DETECTED Final   Influenza B NOT DETECTED NOT DETECTED Final   Parainfluenza Virus 1 NOT DETECTED NOT DETECTED Final   Parainfluenza Virus 2 NOT DETECTED NOT DETECTED Final    Parainfluenza Virus 3 NOT DETECTED NOT DETECTED Final   Parainfluenza Virus 4 NOT DETECTED NOT DETECTED Final   Respiratory Syncytial Virus NOT DETECTED NOT DETECTED Final   Bordetella pertussis NOT DETECTED NOT DETECTED Final   Chlamydophila pneumoniae NOT DETECTED NOT DETECTED Final   Mycoplasma pneumoniae NOT DETECTED NOT DETECTED Final    Comment: Performed at [facility]     Radiology Studies: Dg Chest Port 1 View  Result Date: 03/29/2016 CLINICAL DATA:  SOB today, hx CHF EXAM: PORTABLE CHEST 1 VIEW COMPARISON:  CT 03/24/2016 FINDINGS: Cardiac silhouette is enlarged. There is fine interstitial pattern. No focal consolidation. No pneumothorax. IMPRESSION: Cardiomegaly and interstitial edema. Electronically Signed   By: Suzy Bouchard M.D.   On: 03/29/2016 12:43    Scheduled Meds: . ARIPiprazole  20 mg Oral Daily  . azithromycin  500 mg Oral QHS  . buPROPion  150 mg Oral BID  . docusate sodium  100 mg Oral BID  . enoxaparin (LOVENOX) injection  55 mg Subcutaneous Q24H  . famotidine  20 mg Oral BID  . furosemide  40 mg Intravenous BID  . gabapentin  300 mg Oral TID WC  . gabapentin  600 mg Oral QHS  . ipratropium-albuterol  3 mL Nebulization TID  . methylPREDNISolone (SOLU-MEDROL) injection  60 mg Intravenous Q8H  . nicotine  14 mg Transdermal Daily  . QUEtiapine  100 mg Oral Daily  . QUEtiapine  200 mg Oral QHS  . traZODone  100 mg Oral QHS   Continuous Infusions:    LOS: 4 days   Nashla Althoff, Orpah Melter, MD [facility] Pager [phone]  If 7PM-7AM, please contact night-coverage [email or web address] Password [email or web address] 03/29/2016, 4:12 PM

## 2016-03-30 LAB — BASIC METABOLIC PANEL
Anion gap: 10 (ref 5–15)
BUN: 26 mg/dL — AB (ref 6–20)
CALCIUM: 9.8 mg/dL (ref 8.9–10.3)
CHLORIDE: 91 mmol/L — AB (ref 101–111)
CO2: 34 mmol/L — AB (ref 22–32)
CREATININE: 0.9 mg/dL (ref 0.44–1.00)
GFR calc Af Amer: >60 mL/min (ref >60)
Glucose, Bld: 165 mg/dL — ABNORMAL HIGH (ref 65–99)
POTASSIUM: 5.7 mmol/L — AB (ref 3.5–5.1)
SODIUM: 135 mmol/L (ref 135–145)

## 2016-03-30 LAB — GLUCOSE, CAPILLARY: GLUCOSE-CAPILLARY: 162 mg/dL — AB (ref 65–99)

## 2016-03-30 LAB — POTASSIUM: Potassium: 4.2 mmol/L (ref 3.5–5.1)

## 2016-03-30 MED ORDER — AZITHROMYCIN 250 MG PO TABS: ORAL_TABLET | ORAL | 0 refills | Status: DC

## 2016-03-30 MED ORDER — DOCUSATE SODIUM 100 MG PO CAPS: 100.0000 mg | ORAL_CAPSULE | Freq: Two times a day (BID) | ORAL | 0 refills | Status: DC

## 2016-03-30 MED ORDER — KETOROLAC TROMETHAMINE 10 MG PO TABS: 10.0000 mg | ORAL_TABLET | Freq: Four times a day (QID) | ORAL | 0 refills | Status: DC | PRN

## 2016-03-30 MED ORDER — PREDNISONE 5 MG PO TABS: 5.0000 mg | ORAL_TABLET | Freq: Every day | ORAL | Status: DC

## 2016-03-30 MED ORDER — FUROSEMIDE 40 MG PO TABS: 40.0000 mg | ORAL_TABLET | Freq: Every day | ORAL | 0 refills | Status: DC

## 2016-03-30 NOTE — Progress Notes (Signed)
Pt discharged from the unit via wheelchair. Discharge instructions and prescriptions were given to the pt prior to discharge. No questions or concerns at this time.  Elcie Pelster W Felis Quillin, RN

## 2016-03-30 NOTE — Discharge Summary (Signed)
Physician Discharge Summary  COURTNEI RUDDELL IRJ:[id number] DOB: 09/06/1965 DOA: 03/24/2016  PCP: Philis Fendt, MD  Admit date: 03/24/2016 Discharge date: 03/30/2016  Admitted From: Home Disposition:  Home  Recommendations for Outpatient Follow-up:  1. Follow up with PCP in 1-2 weeks   Discharge Condition:Improved CODE STATUS:Full Diet recommendation:  Heart healthy  Brief/Interim Summary: Please review dictated H&P dated 03/24/2001 for details. Briefly patient is a 50 year old female with a history for schizophrenia, hypertension, tobacco abuse, chronic interstitial lung disease who presents emergency department with increased short of breath and hypoxia at rest.  1. Acute on chronic respiratory failure, hypoxic 1. Noted to be hypoxic on ambulation recently. Patient presently is on 2 L nasal cannula (her baseline O2 requirements)  2. Patient was continued with scheduled DuoNeb's 3. Patient was continued with IV steroids 4. Patient initially continued with subjective shortness of breath 5. Patient was started on Lasix (see below) 6. Patient to continue with scheduled PO lasix at time of discharge 7. By day of discharge, patient was able to ambulate on room air, maintaining 93% sats 2. Schizophrenia 1. Remained stable. 3. Depression/anxiety 1. stable thus far.  4. Abdominal pain 1. Much improved since receiving bowel regimen 5. Suspected cutely decompensated diastolic congestive heart failure, chronicity is uncertain 1. Presenting chest imaging did demonstrate some cardio megaly 2. Repeat chest x-ray was obtained and personally reviewed by myself. Evidence of cardiomegaly with interstitial edema noted 3. Patient was initially continued on IV Lasix 4. 2-D echocardiogram has been obtained, results reviewed with evidence of grade 2 diastolic dysfunction and normal LVEF 5. Placed patient on 1500 mL fluid restricted diet  Discharge Diagnoses:  Principal Problem:   Acute on  chronic respiratory failure with hypoxia (HCC) Active Problems:   Smoking addiction   Respiratory bronchiolitis interstitial lung disease ([place])   Hypoxia   Schizophrenia ([place])  Discharge Instructions     Medication List    TAKE these medications   albuterol 108 (90 Base) MCG/ACT inhaler Commonly known as:  PROVENTIL HFA;VENTOLIN HFA Inhale 2 puffs into the lungs every 6 (six) hours as needed for wheezing or shortness of breath.   amLODipine 5 MG tablet Commonly known as:  NORVASC Take 5 mg by mouth daily.   ARIPiprazole 20 MG tablet Commonly known as:  ABILIFY Take 20 mg by mouth daily.   azithromycin 250 MG tablet Commonly known as:  ZITHROMAX 1 tab po daily x 1 more dose, zero refills. To be taken on 03/31/16   budesonide-formoterol 160-4.5 MCG/ACT inhaler Commonly known as:  SYMBICORT Inhale 2 puffs into the lungs 2 (two) times daily.   buPROPion 150 MG 12 hr tablet Commonly known as:  WELLBUTRIN SR Take 150 mg by mouth 2 (two) times daily. Qam and q1200.   docusate sodium 100 MG capsule Commonly known as:  COLACE Take 1 capsule (100 mg total) by mouth 2 (two) times daily.   furosemide 40 MG tablet Commonly known as:  LASIX Take 1 tablet (40 mg total) by mouth daily. What changed:  medication strength  how much to take   gabapentin 300 MG capsule Commonly known as:  NEURONTIN Take 300-600 mg by mouth See admin instructions. Take 300 mg 3 times daily and 600 mg at bedtime.   ketorolac 10 MG tablet Commonly known as:  TORADOL Take 1 tablet (10 mg total) by mouth every 6 (six) hours as needed.   nicotine 14 mg/24hr patch Commonly known as:  NICODERM CQ - dosed in mg/24  hours Place 14 mg onto the skin daily. For 28 days, starting 03/19/16.   nicotine 7 mg/24hr patch Commonly known as:  NICODERM CQ - dosed in mg/24 hr Place 7 mg onto the skin daily. Start after you finish 14 mg patches.   oxyCODONE-acetaminophen 5-325 MG tablet Commonly known as:   PERCOCET/ROXICET Take 1 tablet by mouth every 4 (four) hours as needed for moderate pain. As needed What changed:  when to take this  additional instructions   predniSONE 5 MG tablet Commonly known as:  DELTASONE Take 1 tablet (5 mg total) by mouth daily with breakfast.   QUEtiapine 100 MG tablet Commonly known as:  SEROQUEL Take 100-200 mg by mouth See admin instructions. Take 100 mg every morning and 200 mg every evening.   tiZANidine 4 MG tablet Commonly known as:  ZANAFLEX Take 4 mg by mouth 2 (two) times daily as needed for muscle spasms.   traZODone 100 MG tablet Commonly known as:  DESYREL Take 100 mg by mouth at bedtime.      Follow-up Information    Philis Fendt, MD. Schedule an appointment as soon as possible for a visit in 1 week(s).   Specialty:  Internal Medicine Contact information: [address] [place] [address] [phone]          No Known Allergies   Procedures/Studies: Ct Angio Chest Pe W And/or Wo Contrast  Result Date: 03/24/2016 CLINICAL DATA:  50 year old female with productive cough, hypoxia, shortness of breath, epigastric pain for 2 weeks. Symptoms following flu shot. Abnormal D-dimer. Initial encounter. EXAM: CT ANGIOGRAPHY CHEST WITH CONTRAST TECHNIQUE: Multidetector CT imaging of the chest was performed using the standard protocol during bolus administration of intravenous contrast. Multiplanar CT image reconstructions and MIPs were obtained to evaluate the vascular anatomy. CONTRAST:  100 mL Isovue 370 COMPARISON:  Chest radiographs 1556 hours today. High-resolution chest CT 01/22/2014. FINDINGS: Cardiovascular: Good contrast bolus timing in the pulmonary arterial tree. Intermittent respiratory motion artifact. No evidence of acute pulmonary embolus. Mild cardiomegaly. No pericardial effusion. Negative visualized aorta. Mediastinum/Nodes: Enlarged bilateral hilar lymph nodes, individually measuring up to 15 mm short axis. The  comparison chest CT in 2015 was noncontrast, but this appears to be a new finding. Similar increased sub carina lymph nodes. Mildly increased right peritracheal lymph nodes up to 9 mm. Prevascular and anterior carina nodes have a more normal appearance. No axillary lymphadenopathy. Lungs/Pleura: Low lung volumes with atelectatic changes to the major airways. Diffuse bilateral pulmonary ground-glass opacity with intermittent superimposed more streaky and dense peribronchial opacity. Small or trace layering pleural effusions. No consolidation. Upper Abdomen: Negative visualized liver, spleen, left adrenal gland, and stomach in the upper abdomen. Musculoskeletal: No acute osseous abnormality identified. Review of the MIP images confirms the above findings. IMPRESSION: 1.  No evidence of acute pulmonary embolus. The low to intermediate density foci seen about the pulmonary arteries at both hila appear related to (reactive) lymphadenopathy (see #4). 2. Low lung volumes with diffuse pulmonary ground-glass and intermittent more streaky peribronchial opacity. Underlying chronic pulmonary interstitial changes which were detailed on the 2015 high-resolution CT. 3. Small or trace layering pleural effusions. Mild cardiomegaly. No significant pericardial effusion. 4. Reactive appearing hilar and, to a lesser extent, mediastinal lymphadenopathy. 5. Overall the constellation of clinical and imaging findings suggests an Acute Viral or Atypical Respiratory Infection superimposed on a Chronic Lung Disease. Electronically Signed   By: Genevie Ann M.D.   On: 03/24/2016 17:34   Dg Chest [facility]  1 View  Result Date: 03/29/2016 CLINICAL DATA:  SOB today, hx CHF EXAM: PORTABLE CHEST 1 VIEW COMPARISON:  CT 03/24/2016 FINDINGS: Cardiac silhouette is enlarged. There is fine interstitial pattern. No focal consolidation. No pneumothorax. IMPRESSION: Cardiomegaly and interstitial edema. Electronically Signed   By: Suzy Bouchard M.D.   On:  03/29/2016 12:43   Dg Chest Portable 1 View  Result Date: 03/24/2016 CLINICAL DATA:  Shortness of breath, cough, hypoxia, right chest pain EXAM: PORTABLE CHEST 1 VIEW COMPARISON:  09/06/2015 FINDINGS: Small left pleural effusion. Increased interstitial markings without frank interstitial edema. No pneumothorax. Cardiomegaly. IMPRESSION: Small left pleural effusion. Increased interstitial markings without frank interstitial edema. Electronically Signed   By: Julian Hy M.D.   On: 03/24/2016 16:16    Subjective: Eager to go home today  Discharge Exam: Vitals:   03/30/16 0529 03/30/16 1338  BP: (!) 142/70 122/67  Pulse: 78 93  Resp: 19 18  Temp: 98.1 F (36.7 C) 98 F (36.7 C)   Vitals:   03/30/16 0932 03/30/16 1100 03/30/16 1338 03/30/16 1442  BP:   122/67   Pulse:   93   Resp:   18   Temp:   98 F (36.7 C)   TempSrc:   Oral   SpO2: 95% 93% 95% 96%  Weight:      Height:        General: Pt is alert, awake, not in acute distress Cardiovascular: RRR, S1/S2 +, no rubs, no gallops Respiratory: CTA bilaterally, no wheezing, no rhonchi Abdominal: Soft, NT, ND, bowel sounds + Extremities: no edema, no cyanosis   The results of significant diagnostics from this hospitalization (including imaging, microbiology, ancillary and laboratory) are listed below for reference.     Microbiology: Recent Results (from the past 240 hour(s))  Respiratory Panel by PCR     Status: None   Collection Time: 03/25/16  1:58 AM  Result Value Ref Range Status   Adenovirus NOT DETECTED NOT DETECTED Final   Coronavirus 229E NOT DETECTED NOT DETECTED Final   Coronavirus HKU1 NOT DETECTED NOT DETECTED Final   Coronavirus NL63 NOT DETECTED NOT DETECTED Final   Coronavirus OC43 NOT DETECTED NOT DETECTED Final   Metapneumovirus NOT DETECTED NOT DETECTED Final   Rhinovirus / Enterovirus NOT DETECTED NOT DETECTED Final   Influenza A NOT DETECTED NOT DETECTED Final   Influenza B NOT DETECTED NOT  DETECTED Final   Parainfluenza Virus 1 NOT DETECTED NOT DETECTED Final   Parainfluenza Virus 2 NOT DETECTED NOT DETECTED Final   Parainfluenza Virus 3 NOT DETECTED NOT DETECTED Final   Parainfluenza Virus 4 NOT DETECTED NOT DETECTED Final   Respiratory Syncytial Virus NOT DETECTED NOT DETECTED Final   Bordetella pertussis NOT DETECTED NOT DETECTED Final   Chlamydophila pneumoniae NOT DETECTED NOT DETECTED Final   Mycoplasma pneumoniae NOT DETECTED NOT DETECTED Final    Comment: Performed at [facility]: BNP (last 3 results)  Recent Labs  03/24/16 1539 03/29/16 0525  BNP 114.4* 35.3   Basic Metabolic Panel:  Recent Labs Lab 03/24/16 1539 03/25/16 0609 03/29/16 0525 03/30/16 0449 03/30/16 0759  NA 138 138 136 135  --   K 3.9 4.8 4.0 5.7* 4.2  CL 102 102 93* 91*  --   CO2 30 31 34* 34*  --   GLUCOSE 134* 121* 109* 165*  --   BUN _0 26*  --   CREATININE 0.84 0.76 0.71 0.90  --  CALCIUM 8.8* 9.1 9.4 9.8  --    Liver Function Tests:  Recent Labs Lab 03/24/16 1539  AST 39  ALT 50  ALKPHOS 63  BILITOT 0.9  PROT 8.3*  ALBUMIN 3.8    Recent Labs Lab 03/24/16 1539  LIPASE 19   No results for input(s): AMMONIA in the last 168 hours. CBC:  Recent Labs Lab 03/24/16 1539  WBC 9.4  NEUTROABS 5.4  HGB 13.2  HCT 45.7  MCV 78.7  PLT 234   Cardiac Enzymes: No results for input(s): CKTOTAL, CKMB, CKMBINDEX, TROPONINI in the last 168 hours. BNP: Invalid input(s): POCBNP CBG:  Recent Labs Lab 03/27/16 0726 03/28/16 0839 03/29/16 0736 03/30/16 0726  GLUCAP 164* 258* 127* 162*   D-Dimer No results for input(s): DDIMER in the last 72 hours. Hgb A1c No results for input(s): HGBA1C in the last 72 hours. Lipid Profile No results for input(s): CHOL, HDL, LDLCALC, TRIG, CHOLHDL, LDLDIRECT in the last 72 hours. Thyroid function studies No results for input(s): TSH, T4TOTAL, T3FREE, THYROIDAB in the last 72 hours.  Invalid input(s):  FREET3 Anemia work up No results for input(s): VITAMINB12, FOLATE, FERRITIN, TIBC, IRON, RETICCTPCT in the last 72 hours. Urinalysis    Component Value Date/Time   COLORURINE AMBER (A) 03/24/2016 1530   APPEARANCEUR CLOUDY (A) 03/24/2016 1530   LABSPEC 1.024 03/24/2016 1530   PHURINE 6.0 03/24/2016 1530   GLUCOSEU NEGATIVE 03/24/2016 1530   HGBUR NEGATIVE 03/24/2016 1530   BILIRUBINUR NEGATIVE 03/24/2016 1530   KETONESUR NEGATIVE 03/24/2016 1530   PROTEINUR 30 (A) 03/24/2016 1530   UROBILINOGEN 0.2 07/13/2014 1122   NITRITE NEGATIVE 03/24/2016 1530   LEUKOCYTESUR NEGATIVE 03/24/2016 1530   Sepsis Labs Invalid input(s): PROCALCITONIN,  WBC,  LACTICIDVEN Microbiology Recent Results (from the past 240 hour(s))  Respiratory Panel by PCR     Status: None   Collection Time: 03/25/16  1:58 AM  Result Value Ref Range Status   Adenovirus NOT DETECTED NOT DETECTED Final   Coronavirus 229E NOT DETECTED NOT DETECTED Final   Coronavirus HKU1 NOT DETECTED NOT DETECTED Final   Coronavirus NL63 NOT DETECTED NOT DETECTED Final   Coronavirus OC43 NOT DETECTED NOT DETECTED Final   Metapneumovirus NOT DETECTED NOT DETECTED Final   Rhinovirus / Enterovirus NOT DETECTED NOT DETECTED Final   Influenza A NOT DETECTED NOT DETECTED Final   Influenza B NOT DETECTED NOT DETECTED Final   Parainfluenza Virus 1 NOT DETECTED NOT DETECTED Final   Parainfluenza Virus 2 NOT DETECTED NOT DETECTED Final   Parainfluenza Virus 3 NOT DETECTED NOT DETECTED Final   Parainfluenza Virus 4 NOT DETECTED NOT DETECTED Final   Respiratory Syncytial Virus NOT DETECTED NOT DETECTED Final   Bordetella pertussis NOT DETECTED NOT DETECTED Final   Chlamydophila pneumoniae NOT DETECTED NOT DETECTED Final   Mycoplasma pneumoniae NOT DETECTED NOT DETECTED Final    Comment: Performed at [facility]     SIGNED:   Donne Hazel, MD  [facility] 03/30/2016, 3:02 PM  If 7PM-7AM, please contact  night-coverage [email or web address] Password [email or web address]

## 2016-03-30 NOTE — Progress Notes (Signed)
Pt oxygen saturations 93% while ambulating on 2 L of oxygen.  Tyric Rodeheaver W Caragh Gasper, RN

## 2016-07-04 ENCOUNTER — Inpatient Hospital Stay (HOSPITAL_COMMUNITY)
Admission: EM | Admit: 2016-07-04 | Discharge: 2016-07-07 | DRG: 196 | Disposition: A | Discharge status: Home / Self Care | Payer: Medicaid Other | Attending: Internal Medicine | Admitting: Internal Medicine

## 2016-07-04 ENCOUNTER — Encounter (HOSPITAL_COMMUNITY): Payer: Self-pay

## 2016-07-04 ENCOUNTER — Emergency Department (HOSPITAL_COMMUNITY): Payer: Medicaid Other

## 2016-07-04 DIAGNOSIS — F209 Schizophrenia, unspecified: Secondary | ICD-10-CM | POA: Diagnosis present

## 2016-07-04 DIAGNOSIS — Z7952 Long term (current) use of systemic steroids: Secondary | ICD-10-CM

## 2016-07-04 DIAGNOSIS — J9622 Acute and chronic respiratory failure with hypercapnia: Secondary | ICD-10-CM | POA: Diagnosis present

## 2016-07-04 DIAGNOSIS — E662 Morbid (severe) obesity with alveolar hypoventilation: Secondary | ICD-10-CM | POA: Diagnosis present

## 2016-07-04 DIAGNOSIS — Z6841 Body Mass Index (BMI) 40.0 and over, adult: Secondary | ICD-10-CM

## 2016-07-04 DIAGNOSIS — J441 Chronic obstructive pulmonary disease with (acute) exacerbation: Secondary | ICD-10-CM

## 2016-07-04 DIAGNOSIS — F1010 Alcohol abuse, uncomplicated: Secondary | ICD-10-CM | POA: Diagnosis present

## 2016-07-04 DIAGNOSIS — R0602 Shortness of breath: Secondary | ICD-10-CM | POA: Diagnosis not present

## 2016-07-04 DIAGNOSIS — R296 Repeated falls: Secondary | ICD-10-CM | POA: Diagnosis not present

## 2016-07-04 DIAGNOSIS — I110 Hypertensive heart disease with heart failure: Secondary | ICD-10-CM | POA: Diagnosis present

## 2016-07-04 DIAGNOSIS — I5033 Acute on chronic diastolic (congestive) heart failure: Secondary | ICD-10-CM | POA: Diagnosis present

## 2016-07-04 DIAGNOSIS — Z825 Family history of asthma and other chronic lower respiratory diseases: Secondary | ICD-10-CM | POA: Diagnosis not present

## 2016-07-04 DIAGNOSIS — F17211 Nicotine dependence, cigarettes, in remission: Secondary | ICD-10-CM | POA: Diagnosis present

## 2016-07-04 DIAGNOSIS — J9621 Acute and chronic respiratory failure with hypoxia: Secondary | ICD-10-CM | POA: Diagnosis present

## 2016-07-04 DIAGNOSIS — Z7951 Long term (current) use of inhaled steroids: Secondary | ICD-10-CM | POA: Diagnosis not present

## 2016-07-04 DIAGNOSIS — R0689 Other abnormalities of breathing: Secondary | ICD-10-CM | POA: Diagnosis not present

## 2016-07-04 DIAGNOSIS — I5031 Acute diastolic (congestive) heart failure: Secondary | ICD-10-CM

## 2016-07-04 DIAGNOSIS — R0609 Other forms of dyspnea: Secondary | ICD-10-CM

## 2016-07-04 DIAGNOSIS — J84115 Respiratory bronchiolitis interstitial lung disease: Principal | ICD-10-CM | POA: Diagnosis present

## 2016-07-04 DIAGNOSIS — F329 Major depressive disorder, single episode, unspecified: Secondary | ICD-10-CM | POA: Diagnosis present

## 2016-07-04 DIAGNOSIS — Z9981 Dependence on supplemental oxygen: Secondary | ICD-10-CM | POA: Diagnosis not present

## 2016-07-04 DIAGNOSIS — R6889 Other general symptoms and signs: Secondary | ICD-10-CM

## 2016-07-04 DIAGNOSIS — J449 Chronic obstructive pulmonary disease, unspecified: Secondary | ICD-10-CM | POA: Diagnosis present

## 2016-07-04 LAB — BRAIN NATRIURETIC PEPTIDE: B Natriuretic Peptide: 90.8 pg/mL (ref 0.0–100.0)

## 2016-07-04 LAB — CBC
HCT: 47.6 % — ABNORMAL HIGH (ref 36.0–46.0)
HCT: 48.8 % — ABNORMAL HIGH (ref 36.0–46.0)
HEMOGLOBIN: 14.7 g/dL (ref 12.0–15.0)
Hemoglobin: 14 g/dL (ref 12.0–15.0)
MCH: 23.4 pg — AB (ref 26.0–34.0)
MCH: 23.9 pg — AB (ref 26.0–34.0)
MCHC: 29.4 g/dL — ABNORMAL LOW (ref 30.0–36.0)
MCHC: 30.1 g/dL (ref 30.0–36.0)
MCV: 79.6 fL (ref 78.0–100.0)
MCV: 79.2 fL (ref 78.0–100.0)
PLATELETS: 254 10*3/uL (ref 150–400)
Platelets: 268 10*3/uL (ref 150–400)
RBC: 5.98 MIL/uL — AB (ref 3.87–5.11)
RBC: 6.16 MIL/uL — AB (ref 3.87–5.11)
RDW: 18.2 % — ABNORMAL HIGH (ref 11.5–15.5)
RDW: 18.2 % — ABNORMAL HIGH (ref 11.5–15.5)
WBC: 9.6 10*3/uL (ref 4.0–10.5)
WBC: 7.3 10*3/uL (ref 4.0–10.5)

## 2016-07-04 LAB — BASIC METABOLIC PANEL
Anion gap: 8 (ref 5–15)
BUN: <5 mg/dL — ABNORMAL LOW (ref 6–20)
CALCIUM: 8.5 mg/dL — AB (ref 8.9–10.3)
CO2: 27 mmol/L (ref 22–32)
CREATININE: 0.82 mg/dL (ref 0.44–1.00)
Chloride: 100 mmol/L — ABNORMAL LOW (ref 101–111)
GFR calc non Af Amer: >60 mL/min (ref >60)
Glucose, Bld: 84 mg/dL (ref 65–99)
Potassium: 4.2 mmol/L (ref 3.5–5.1)
SODIUM: 135 mmol/L (ref 135–145)

## 2016-07-04 LAB — CREATININE, SERUM
CREATININE: 0.85 mg/dL (ref 0.44–1.00)
GFR calc Af Amer: >60 mL/min (ref >60)

## 2016-07-04 LAB — I-STAT TROPONIN, ED: Troponin i, poc: 0 ng/mL (ref 0.00–0.08)

## 2016-07-04 LAB — INFLUENZA PANEL BY PCR (TYPE A & B)
INFLAPCR: NEGATIVE
Influenza B By PCR: NEGATIVE

## 2016-07-04 LAB — TROPONIN I: Troponin I: <0.03 ng/mL (ref <0.03)

## 2016-07-04 LAB — MAGNESIUM: MAGNESIUM: 2 mg/dL (ref 1.7–2.4)

## 2016-07-04 MED ORDER — ACETAMINOPHEN 325 MG PO TABS: 650.0000 mg | ORAL_TABLET | Freq: Four times a day (QID) | ORAL | Status: DC | PRN

## 2016-07-04 MED ORDER — ACETAMINOPHEN 650 MG RE SUPP
650.0000 mg | Freq: Four times a day (QID) | RECTAL | Status: DC | PRN
Start: 2016-07-04 — End: 2016-07-07

## 2016-07-04 MED ORDER — METHYLPREDNISOLONE SODIUM SUCC 125 MG IJ SOLR
60.0000 mg | Freq: Four times a day (QID) | INTRAMUSCULAR | Status: AC
  Administered 2016-07-04 – 2016-07-05 (×4): 60 mg via INTRAVENOUS
  Filled 2016-07-04 (×4): qty 2

## 2016-07-04 MED ORDER — FUROSEMIDE 40 MG PO TABS: 40.0000 mg | ORAL_TABLET | Freq: Every day | ORAL | Status: DC

## 2016-07-04 MED ORDER — ONDANSETRON HCL 4 MG/2ML IJ SOLN: 4.0000 mg | Freq: Four times a day (QID) | INTRAMUSCULAR | Status: DC | PRN

## 2016-07-04 MED ORDER — OXYCODONE-ACETAMINOPHEN 5-325 MG PO TABS: 1.0000 | ORAL_TABLET | ORAL | Status: DC | PRN

## 2016-07-04 MED ORDER — ONDANSETRON HCL 4 MG PO TABS: 4.0000 mg | ORAL_TABLET | Freq: Four times a day (QID) | ORAL | Status: DC | PRN

## 2016-07-04 MED ORDER — NICOTINE 14 MG/24HR TD PT24: 14.0000 mg | MEDICATED_PATCH | Freq: Every day | TRANSDERMAL | Status: DC

## 2016-07-04 MED ORDER — LEVALBUTEROL HCL 0.63 MG/3ML IN NEBU
0.6300 mg | INHALATION_SOLUTION | Freq: Four times a day (QID) | RESPIRATORY_TRACT | Status: DC
  Administered 2016-07-04 – 2016-07-06 (×6): 0.63 mg via RESPIRATORY_TRACT
  Filled 2016-07-04 (×6): qty 3

## 2016-07-04 MED ORDER — FOLIC ACID 1 MG PO TABS
1.0000 mg | ORAL_TABLET | Freq: Every day | ORAL | Status: DC
  Administered 2016-07-04 – 2016-07-07 (×4): 1 mg via ORAL
  Filled 2016-07-04 (×4): qty 1

## 2016-07-04 MED ORDER — HYDROMORPHONE HCL 1 MG/ML IJ SOLN: 1.0000 mg | Freq: Once | INTRAMUSCULAR | Status: DC

## 2016-07-04 MED ORDER — QUETIAPINE FUMARATE 300 MG PO TABS
300.0000 mg | ORAL_TABLET | Freq: Every day | ORAL | Status: DC
  Administered 2016-07-04 – 2016-07-06 (×3): 300 mg via ORAL
  Filled 2016-07-04 (×4): qty 1

## 2016-07-04 MED ORDER — PREDNISONE 20 MG PO TABS
60.0000 mg | ORAL_TABLET | Freq: Once | ORAL | Status: AC
  Administered 2016-07-04: 60 mg via ORAL
  Filled 2016-07-04: qty 3

## 2016-07-04 MED ORDER — TRAZODONE HCL 100 MG PO TABS
100.0000 mg | ORAL_TABLET | Freq: Every day | ORAL | Status: DC
  Administered 2016-07-04 – 2016-07-05 (×2): 100 mg via ORAL
  Filled 2016-07-04 (×2): qty 1

## 2016-07-04 MED ORDER — BUPROPION HCL ER (SR) 150 MG PO TB12
150.0000 mg | ORAL_TABLET | Freq: Two times a day (BID) | ORAL | Status: DC
  Administered 2016-07-04 – 2016-07-07 (×6): 150 mg via ORAL
  Filled 2016-07-04 (×6): qty 1

## 2016-07-04 MED ORDER — ARIPIPRAZOLE 5 MG PO TABS
20.0000 mg | ORAL_TABLET | Freq: Every day | ORAL | Status: DC
  Administered 2016-07-05 – 2016-07-07 (×3): 20 mg via ORAL
  Filled 2016-07-04 (×3): qty 4

## 2016-07-04 MED ORDER — AZITHROMYCIN 500 MG PO TABS: 500.0000 mg | ORAL_TABLET | Freq: Every day | ORAL | Status: DC

## 2016-07-04 MED ORDER — VITAMIN B-1 100 MG PO TABS
100.0000 mg | ORAL_TABLET | Freq: Every day | ORAL | Status: DC
  Administered 2016-07-04 – 2016-07-07 (×4): 100 mg via ORAL
  Filled 2016-07-04 (×4): qty 1

## 2016-07-04 MED ORDER — FUROSEMIDE 10 MG/ML IJ SOLN
40.0000 mg | Freq: Once | INTRAMUSCULAR | Status: AC
  Administered 2016-07-04: 40 mg via INTRAVENOUS
  Filled 2016-07-04: qty 4

## 2016-07-04 MED ORDER — IPRATROPIUM-ALBUTEROL 0.5-2.5 (3) MG/3ML IN SOLN
3.0000 mL | Freq: Once | RESPIRATORY_TRACT | Status: AC
  Administered 2016-07-04: 3 mL via RESPIRATORY_TRACT
  Filled 2016-07-04: qty 3

## 2016-07-04 MED ORDER — MOMETASONE FURO-FORMOTEROL FUM 200-5 MCG/ACT IN AERO: 2.0000 | INHALATION_SPRAY | Freq: Two times a day (BID) | RESPIRATORY_TRACT | Status: DC

## 2016-07-04 MED ORDER — DEXTROSE 5 % IV SOLN
1.0000 g | INTRAVENOUS | Status: DC
  Administered 2016-07-04 – 2016-07-06 (×3): 1 g via INTRAVENOUS
  Filled 2016-07-04 (×3): qty 10

## 2016-07-04 MED ORDER — PREDNISONE 50 MG PO TABS
50.0000 mg | ORAL_TABLET | Freq: Every day | ORAL | Status: DC
  Administered 2016-07-06 – 2016-07-07 (×2): 50 mg via ORAL
  Filled 2016-07-04 (×2): qty 1

## 2016-07-04 MED ORDER — ONDANSETRON HCL 4 MG/2ML IJ SOLN: 4.0000 mg | Freq: Once | INTRAMUSCULAR | Status: DC

## 2016-07-04 MED ORDER — ENOXAPARIN SODIUM 40 MG/0.4ML ~~LOC~~ SOLN
40.0000 mg | SUBCUTANEOUS | Status: DC
  Administered 2016-07-04 – 2016-07-06 (×3): 40 mg via SUBCUTANEOUS
  Filled 2016-07-04 (×3): qty 0.4

## 2016-07-04 MED ORDER — ALBUTEROL SULFATE HFA 108 (90 BASE) MCG/ACT IN AERS: 2.0000 | INHALATION_SPRAY | Freq: Four times a day (QID) | RESPIRATORY_TRACT | Status: DC | PRN

## 2016-07-04 MED ORDER — GUAIFENESIN ER 600 MG PO TB12
600.0000 mg | ORAL_TABLET | Freq: Two times a day (BID) | ORAL | Status: DC
  Administered 2016-07-04 – 2016-07-07 (×6): 600 mg via ORAL
  Filled 2016-07-04 (×6): qty 1

## 2016-07-04 MED ORDER — TIZANIDINE HCL 4 MG PO TABS: 4.0000 mg | ORAL_TABLET | Freq: Two times a day (BID) | ORAL | Status: DC | PRN

## 2016-07-04 NOTE — ED Triage Notes (Signed)
Pt presents with report of multiple falls over the past few days, pt reports her arms and legs give out on her, only complains of pain to R leg.  Pt also reports increasing shortness of breath with cough, reports difficulty coughing up phlegm.  O2 sat: 79 with pt on 2 liters O2.

## 2016-07-04 NOTE — ED Notes (Signed)
ED Provider at bedside. 

## 2016-07-04 NOTE — ED Notes (Signed)
Antibiotic sent up with patient

## 2016-07-04 NOTE — Progress Notes (Signed)
RT placed pt on nasal cannula 6L due to desat on room air. Pt states she wears 2L at home.  Sat recovered to 95%.

## 2016-07-04 NOTE — H&P (Signed)
[facility] History and Physical  Diane Gilbert VZD:[id number] DOB: 1965/12/13 DOA: 07/04/2016  Referring physician:   PCP: Philis Fendt, MD   Chief Complaint: increasing shortness of breath   HPI:  50 y.o. female with medical history significant for schizophrenia, hypertension, tobacco abuse, and chronic interstitial lung disease On 2 L of oxygen at baseline,who presents the emergency department for evaluation of dyspnea and hypoxia at rest. She reports worsening in her chronic dyspnea Since Thursday. Dyspnea has been worsening slowly over the last 1 month.  This morning, her respiratory status had worsened further and she was dyspneic at rest. Oxygen saturation found to be in the 70s on arrival to the ER on 2 L of oxygen.Currently requiring 6 L of oxygen.  She has been treated withduo nebs, Prednisone 60 mg, Lasix 40 mg IV,  Continues to be short of breath. Patient notes some recent chills, but denies fevers per se. She denies chest pain or palpitations, and denies lower extremity edema or orthopnea. There has been no recent travel or sick contacts.Apparently also reported  multiple falls over the past few days, pt reports her arms and legs give out on her, only complains of pain to R leg.  Pt also reports increasing shortness of breath with cough, reports difficulty coughing up phlegm.  O2 sat: 79 with pt on 2 liters O2.      Review of Systems:  All other systems reviewed and apart from HPI, are negative     Past Medical History:  Diagnosis Date  . Alcohol abuse   . Anxiety   . Arthritis   . Depression   . Headache(784.0)   . Hypertension   . Insomnia   . Interstitial lung disease ([place])   . Oxygen dependent   . Schizophrenia ([place])   . Shortness of breath dyspnea   . Sleep apnea      Past Surgical History:  Procedure Laterality Date  . BACK SURGERY    . LUNG BIOPSY Left 07/16/2014   Procedure: LUNG BIOPSY;  Surgeon: Grace Isaac, MD;  Location: [facility];   Service: Thoracic;  Laterality: Left;  Marland Kitchen MULTIPLE TOOTH EXTRACTIONS    . VIDEO ASSISTED THORACOSCOPY Left 07/16/2014   Procedure: VIDEO ASSISTED THORACOSCOPY;  Surgeon: Grace Isaac, MD;  Location: [facility];  Service: Thoracic;  Laterality: Left;  Marland Kitchen VIDEO BRONCHOSCOPY Bilateral 04/11/2014   Procedure: VIDEO BRONCHOSCOPY WITH FLUORO;  Surgeon: Kathee Delton, MD;  Location: [facility];  Service: Cardiopulmonary;  Laterality: Bilateral;  . VIDEO BRONCHOSCOPY N/A 07/16/2014   Procedure: VIDEO BRONCHOSCOPY;  Surgeon: Grace Isaac, MD;  Location: [facility] OR;  Service: Thoracic;  Laterality: N/A;  . WEDGE RESECTION Left 07/16/2014   Procedure: WEDGE RESECTION;  Surgeon: Grace Isaac, MD;  Location: [facility];  Service: Thoracic;  Laterality: Left;  left upper lobe lung resection      Social History:  reports that she has quit smoking. Her smoking use included Cigarettes. She has a 45.00 pack-year smoking history. She has never used smokeless tobacco. She reports that she drinks alcohol. She reports that she does not use drugs.    No Known Allergies  Family History  Problem Relation Age of Onset  . Breast cancer Mother   . COPD Maternal Aunt          Prior to Admission medications   Medication Sig Start Date End Date Taking? Authorizing Provider  ARIPiprazole (ABILIFY) 20 MG tablet Take 20 mg by mouth daily.  Yes Historical Provider, MD  albuterol (PROVENTIL HFA;VENTOLIN HFA) 108 (90 BASE) MCG/ACT inhaler Inhale 2 puffs into the lungs every 6 (six) hours as needed for wheezing or shortness of breath. Patient not taking: Reported on 07/04/2016 05/09/15   Juanito Doom, MD  amLODipine (NORVASC) 5 MG tablet Take 5 mg by mouth daily.    Historical Provider, MD  azithromycin (ZITHROMAX) 250 MG tablet 1 tab po daily x 1 more dose, zero refills. To be taken on 03/31/16 03/30/16   Donne Hazel, MD  budesonide-formoterol [facility]) 160-4.5 MCG/ACT inhaler Inhale 2 puffs into the lungs 2  (two) times daily. Patient not taking: Reported on 03/24/2016 05/09/15   Juanito Doom, MD  buPROPion [facility] SR) 150 MG 12 hr tablet Take 150 mg by mouth 2 (two) times daily. Qam and q1200.    Historical Provider, MD  docusate sodium (COLACE) 100 MG capsule Take 1 capsule (100 mg total) by mouth 2 (two) times daily. 03/30/16   Donne Hazel, MD  furosemide (LASIX) 40 MG tablet Take 1 tablet (40 mg total) by mouth daily. 03/30/16   Donne Hazel, MD  gabapentin (NEURONTIN) 300 MG capsule Take 300-600 mg by mouth See admin instructions. Take 300 mg 3 times daily and 600 mg at bedtime.     Historical Provider, MD  ketorolac (TORADOL) 10 MG tablet Take 1 tablet (10 mg total) by mouth every 6 (six) hours as needed. 03/30/16   Donne Hazel, MD  nicotine (NICODERM CQ - DOSED IN MG/24 HOURS) 14 mg/24hr patch Place 14 mg onto the skin daily. For 28 days, starting 03/19/16.    Historical Provider, MD  nicotine (NICODERM CQ - DOSED IN MG/24 HR) 7 mg/24hr patch Place 7 mg onto the skin daily. Start after you finish 14 mg patches.    Historical Provider, MD  oxyCODONE-acetaminophen (PERCOCET/ROXICET) 5-325 MG per tablet Take 1 tablet by mouth every 4 (four) hours as needed for moderate pain. As needed Patient taking differently: Take 1 tablet by mouth 2 (two) times daily as needed for moderate pain. As needed 07/19/14   John Giovanni, PA-C  predniSONE (DELTASONE) 5 MG tablet Take 1 tablet (5 mg total) by mouth daily with breakfast. 03/30/16   Donne Hazel, MD  QUEtiapine (SEROQUEL) 100 MG tablet Take 100-200 mg by mouth See admin instructions. Take 100 mg every morning and 200 mg every evening.    Historical Provider, MD  tiZANidine (ZANAFLEX) 4 MG tablet Take 4 mg by mouth 2 (two) times daily as needed for muscle spasms. 01/31/16   Historical Provider, MD  traZODone (DESYREL) 100 MG tablet Take 100 mg by mouth at bedtime.    Historical Provider, MD     Physical Exam: Vitals:   07/04/16 1600 07/04/16  1630 07/04/16 1645 07/04/16 1730  BP: 136/70 133/87 135/89 136/69  Pulse: 75 74 74 75  Resp: _0 Temp:      TempSrc:      SpO2: 95% 96% 99% 97%  Weight:      Height:            Vitals:   07/04/16 1600 07/04/16 1630 07/04/16 1645 07/04/16 1730  BP: 136/70 133/87 135/89 136/69  Pulse: 75 74 74 75  Resp: _1 Temp:      TempSrc:      SpO2: 95% 96% 99% 97%  Weight:      Height:       Constitutional:  In mild respiratory distress with pauses between 3-4 words, calm, comfortable Eyes: PERTLA, lids and conjunctivae normal ENMT: Mucous membranes are moist. Posterior pharynx clear of any exudate or lesions.   Neck: normal, supple, no masses, no thyromegaly Respiratory: Subtle rhonchi at left mid-lung zones. Normal respiratory effort. No accessory muscle use.  Cardiovascular: S1 & S2 heard, regular rate and rhythm. No extremity edema. No significant JVD. Abdomen: No distension, no tenderness, no masses palpated. Bowel sounds normal.  Musculoskeletal: no clubbing / cyanosis. No joint deformity upper and lower extremities. Normal muscle tone.  Skin: no significant rashes, lesions, ulcers. Warm, dry, well-perfused. Neurologic: CN 2-12 grossly intact. Sensation intact, DTR normal. Strength 5/5 in all 4 limbs.  Psychiatric: Normal judgment and insight. Alert and oriented x 3. Normal mood and affect.   Labs on Admission: I have personally reviewed following labs and imaging studies  CBC:  Recent Labs Lab 07/04/16 1510  WBC 9.6  HGB 14.0  HCT 47.6*  MCV 79.6  PLT 923    Basic Metabolic Panel:  Recent Labs Lab 07/04/16 1510  NA 135  K 4.2  CL 100*  CO2 27  GLUCOSE 84  BUN <5*  CREATININE 0.82  CALCIUM 8.5*    GFR: Estimated Creatinine Clearance: 103.3 mL/min (by C-G formula based on SCr of 0.82 mg/dL).  Liver Function Tests: No results for input(s): AST, ALT, ALKPHOS, BILITOT, PROT, ALBUMIN in the last 168 hours. No results for input(s): LIPASE,  AMYLASE in the last 168 hours. No results for input(s): AMMONIA in the last 168 hours.  Coagulation Profile: No results for input(s): INR, PROTIME in the last 168 hours. No results for input(s): DDIMER in the last 72 hours.  Cardiac Enzymes: No results for input(s): CKTOTAL, CKMB, CKMBINDEX, TROPONINI in the last 168 hours.  BNP (last 3 results)  Recent Labs  09/06/15 1404  PROBNP 12.0    HbA1C: No results for input(s): HGBA1C in the last 72 hours. No results found for: HGBA1C   CBG: No results for input(s): GLUCAP in the last 168 hours.  Lipid Profile: No results for input(s): CHOL, HDL, LDLCALC, TRIG, CHOLHDL, LDLDIRECT in the last 72 hours.  Thyroid Function Tests: No results for input(s): TSH, T4TOTAL, FREET4, T3FREE, THYROIDAB in the last 72 hours.  Anemia Panel: No results for input(s): VITAMINB12, FOLATE, FERRITIN, TIBC, IRON, RETICCTPCT in the last 72 hours.  Urine analysis:    Component Value Date/Time   COLORURINE AMBER (A) 03/24/2016 1530   APPEARANCEUR CLOUDY (A) 03/24/2016 1530   LABSPEC 1.024 03/24/2016 1530   PHURINE 6.0 03/24/2016 1530   GLUCOSEU NEGATIVE 03/24/2016 1530   HGBUR NEGATIVE 03/24/2016 1530   BILIRUBINUR NEGATIVE 03/24/2016 1530   KETONESUR NEGATIVE 03/24/2016 1530   PROTEINUR 30 (A) 03/24/2016 1530   UROBILINOGEN 0.2 07/13/2014 1122   NITRITE NEGATIVE 03/24/2016 1530   LEUKOCYTESUR NEGATIVE 03/24/2016 1530    Sepsis Labs: _0 (procalcitonin:4,lacticidven:4) )No results found for this or any previous visit (from the past 240 hour(s)).       Radiological Exams on Admission: Dg Chest Portable 1 View  Result Date: 07/04/2016 CLINICAL DATA:  Shortness of breath, chest pain, smoker EXAM: PORTABLE CHEST 1 VIEW COMPARISON:  03/29/2016 FINDINGS: Stable cardiomegaly and central vascular congestion. Limited exam because of portable technique and body habitus. No definite superimposed pneumonia, collapse or consolidation. Negative  for edema, large effusion or pneumothorax. Trachea is midline. IMPRESSION: Cardiomegaly with vascular congestion.  Stable exam. Electronically Signed   By: Jerilynn Mages.  Shick M.D.  On: 07/04/2016 15:02   Dg Chest Portable 1 View  Result Date: 07/04/2016 CLINICAL DATA:  Shortness of breath, chest pain, smoker EXAM: PORTABLE CHEST 1 VIEW COMPARISON:  03/29/2016 FINDINGS: Stable cardiomegaly and central vascular congestion. Limited exam because of portable technique and body habitus. No definite superimposed pneumonia, collapse or consolidation. Negative for edema, large effusion or pneumothorax. Trachea is midline. IMPRESSION: Cardiomegaly with vascular congestion.  Stable exam. Electronically Signed   By: Jerilynn Mages.  Shick M.D.   On: 07/04/2016 15:02      EKG: Independently reviewed.    Assessment/Plan Principal Problem:   COPD with acute exacerbation (HCC) Active Problems:   Alcohol abuse   Respiratory bronchiolitis interstitial lung disease (HCC)   Schizophrenia (HCC)   Increased oxygen demand   Acute exacerbation of chronic obstructive pulmonary disease (COPD) ([place])     1. Acute on chronic hypoxic respiratory failure - Requires 2 Lpm supplemental oxygen at baseline; was saturating 75% on 2 Lpm In the ER. Currently requiring 6 L  Receive Lasix 40 mg IV 1, prednisone 60 mg by mouth 1 - Has been weaned down to 6 Lpm in the ED while at rest  -She has a history of chronic ILD. She smokes likely has acute COPD exacerbation - Check sputum culture and respiratory virus panel  - Treat with empiric azithromycin /Rocephin Started on COPD focused order set- Solu-Medrol 60 mg IV q8h and DuoNeb q4h prn  - Monitor with continuous pulse oximetry and titrate FiO2 to maintain sat low-mid 90's   2. Respiratory bronchiolitis-associated ILD  - Followed by [facility]   - Presented with significant respiratory sxs in 2005, underwent extensive w/u including inconclusive transbronchial bx and eventual VATS for  wedge-biopsy in Jan 2016 with diagnosis of severe RB-ILD  - [place] of tx is smoking-cessation; pt has cut-back dramatically, but has been difficult to quit - Nicotine patch provided  - Continue supplemental O2, wean as able   3. Schizophrenia  - Appears stable, pt denies active SI, HI, or hallucinations on admission  - Continue current management with Seroquel, Abilify, trazodone, Wellbutrin     DVT prophylaxis:Lovenox     Code Status Orders Full code          consults called:None  Family Communication: Admission, patients condition and plan of care including tests being ordered have been discussed with the patient  who indicates understanding and agree with the plan and Code Status  Admission status: inpatient   Disposition plan: Further plan will depend as patient's clinical course evolves and further radiologic and laboratory data become available. Likely home when stable   At the time of admission, it appears that the appropriate admission status for this patient is INPATIENT .Thisis judged to be reasonable and necessary in order to provide the required intensity of service to ensure the patient's safetygiven thepresenting symptoms, physical exam findings, and initial radiographic and laboratory data in the context of their chronic comorbidities.   Reyne Dumas MD [facility] Pager [phone]  If 7PM-7AM, please contact night-coverage [email or web address] Password [facility]  07/04/2016, 5:48 PM

## 2016-07-04 NOTE — ED Notes (Signed)
Report to [facility]

## 2016-07-04 NOTE — ED Provider Notes (Signed)
[facility] DEPT Provider Note   CSN: [id number] Arrival date & time: 07/04/16  1328     History   Chief Complaint Chief Complaint  Patient presents with  . Fall  . Weakness    HPI SHERALD BALBUENA is a 51 y.o. female.  The history is provided by the patient and medical records. No language interpreter was used.     51 year old female with a past medical history of chronic lung disease, schizophrenia, depression, hypertension who presents today with 2 days of worsening shortness of breath. Patient has interstitial lung disease at baseline and is on 2 L of oxygen at baseline but states that she's been more short of breath despite this. Also endorses left-sided sharp chest pain as well. States hurts take deep breaths. Denies any fevers or chills. Has had nausea but no vomiting. States she had some loose stools yesterday but otherwise no diarrhea. Has not had any abdominal pain, dysuria, hematuria. Endorses worsening fatigue and states "my legs keeps going out". She states that this process is been gradually worsening and that she tried to hold off on coming in. Is noted to be hypoxic in triage in the 70s. She is currently on 6 L of oxygen with O2 saturations in the 90s.  Past Medical History:  Diagnosis Date  . Alcohol abuse   . Anxiety   . Arthritis   . Depression   . Headache(784.0)   . Hypertension   . Insomnia   . Interstitial lung disease ([place])   . Oxygen dependent   . Schizophrenia ([place])   . Shortness of breath dyspnea   . Sleep apnea     Patient Active Problem List   Diagnosis Date Noted  . Increased oxygen demand 07/04/2016  . Hypoxia 03/24/2016  . Schizophrenia ([place]) 03/24/2016  . Leg swelling 01/16/2016  . Dyspnea 09/06/2015  . Acute on chronic respiratory failure with hypoxia ([place]) 06/07/2014  . Respiratory bronchiolitis interstitial lung disease ([place]) 12/26/2013  . Irregular menstrual cycle 01/23/2013  . Smoking addiction 01/23/2013  . Alcohol abuse  02/19/2012  . Hyponatremia 02/19/2012  . Abdominal pain 02/19/2012  . Hypokalemia 02/19/2012    Past Surgical History:  Procedure Laterality Date  . BACK SURGERY    . LUNG BIOPSY Left 07/16/2014   Procedure: LUNG BIOPSY;  Surgeon: Grace Isaac, MD;  Location: [facility];  Service: Thoracic;  Laterality: Left;  Marland Kitchen MULTIPLE TOOTH EXTRACTIONS    . VIDEO ASSISTED THORACOSCOPY Left 07/16/2014   Procedure: VIDEO ASSISTED THORACOSCOPY;  Surgeon: Grace Isaac, MD;  Location: [facility];  Service: Thoracic;  Laterality: Left;  Marland Kitchen VIDEO BRONCHOSCOPY Bilateral 04/11/2014   Procedure: VIDEO BRONCHOSCOPY WITH FLUORO;  Surgeon: Kathee Delton, MD;  Location: [facility];  Service: Cardiopulmonary;  Laterality: Bilateral;  . VIDEO BRONCHOSCOPY N/A 07/16/2014   Procedure: VIDEO BRONCHOSCOPY;  Surgeon: Grace Isaac, MD;  Location: [facility] OR;  Service: Thoracic;  Laterality: N/A;  . WEDGE RESECTION Left 07/16/2014   Procedure: WEDGE RESECTION;  Surgeon: Grace Isaac, MD;  Location: [facility];  Service: Thoracic;  Laterality: Left;  left upper lobe lung resection    OB History    Gravida Para Term Preterm AB Living   0 0 0 0 0 0   SAB TAB Ectopic Multiple Live Births   0 0 0 0         Home Medications    Prior to Admission medications   Medication Sig Start Date End Date Taking? Authorizing Provider  ARIPiprazole (ABILIFY) 20 MG tablet Take 20 mg by mouth daily.   Yes Historical Provider, MD  albuterol (PROVENTIL HFA;VENTOLIN HFA) 108 (90 BASE) MCG/ACT inhaler Inhale 2 puffs into the lungs every 6 (six) hours as needed for wheezing or shortness of breath. Patient not taking: Reported on 07/04/2016 05/09/15   Juanito Doom, MD  amLODipine (NORVASC) 5 MG tablet Take 5 mg by mouth daily.    Historical Provider, MD  azithromycin (ZITHROMAX) 250 MG tablet 1 tab po daily x 1 more dose, zero refills. To be taken on 03/31/16 03/30/16   Donne Hazel, MD  budesonide-formoterol [facility]) 160-4.5 MCG/ACT  inhaler Inhale 2 puffs into the lungs 2 (two) times daily. Patient not taking: Reported on 03/24/2016 05/09/15   Juanito Doom, MD  buPROPion [facility] SR) 150 MG 12 hr tablet Take 150 mg by mouth 2 (two) times daily. Qam and q1200.    Historical Provider, MD  docusate sodium (COLACE) 100 MG capsule Take 1 capsule (100 mg total) by mouth 2 (two) times daily. 03/30/16   Donne Hazel, MD  furosemide (LASIX) 40 MG tablet Take 1 tablet (40 mg total) by mouth daily. 03/30/16   Donne Hazel, MD  gabapentin (NEURONTIN) 300 MG capsule Take 300-600 mg by mouth See admin instructions. Take 300 mg 3 times daily and 600 mg at bedtime.     Historical Provider, MD  ketorolac (TORADOL) 10 MG tablet Take 1 tablet (10 mg total) by mouth every 6 (six) hours as needed. 03/30/16   Donne Hazel, MD  nicotine (NICODERM CQ - DOSED IN MG/24 HOURS) 14 mg/24hr patch Place 14 mg onto the skin daily. For 28 days, starting 03/19/16.    Historical Provider, MD  nicotine (NICODERM CQ - DOSED IN MG/24 HR) 7 mg/24hr patch Place 7 mg onto the skin daily. Start after you finish 14 mg patches.    Historical Provider, MD  oxyCODONE-acetaminophen (PERCOCET/ROXICET) 5-325 MG per tablet Take 1 tablet by mouth every 4 (four) hours as needed for moderate pain. As needed Patient taking differently: Take 1 tablet by mouth 2 (two) times daily as needed for moderate pain. As needed 07/19/14   John Giovanni, PA-C  predniSONE (DELTASONE) 5 MG tablet Take 1 tablet (5 mg total) by mouth daily with breakfast. 03/30/16   Donne Hazel, MD  QUEtiapine (SEROQUEL) 100 MG tablet Take 100-200 mg by mouth See admin instructions. Take 100 mg every morning and 200 mg every evening.    Historical Provider, MD  tiZANidine (ZANAFLEX) 4 MG tablet Take 4 mg by mouth 2 (two) times daily as needed for muscle spasms. 01/31/16   Historical Provider, MD  traZODone (DESYREL) 100 MG tablet Take 100 mg by mouth at bedtime.    Historical Provider, MD    Family  History Family History  Problem Relation Age of Onset  . Breast cancer Mother   . COPD Maternal Aunt     Social History Social History  Substance Use Topics  . Smoking status: Former Smoker    Packs/day: 1.50    Years: 30.00    Types: Cigarettes  . Smokeless tobacco: Never Used     Comment: smoking 3 cigarettes daily "every now and then" 01/16/16  . Alcohol use 0.0 oz/week     Comment: Pt has previously abused alcohol , pt states she still drinks "every now and then"     Allergies   Patient has no known allergies.   Review of Systems Review  of Systems  Constitutional: Positive for diaphoresis and fatigue. Negative for chills and fever.  HENT: Negative for congestion and rhinorrhea.   Respiratory: Positive for shortness of breath (gradually worsening). Negative for wheezing.   Cardiovascular: Positive for chest pain (L sided, sharp).  Gastrointestinal: Positive for nausea. Negative for abdominal pain and vomiting.  Genitourinary: Negative for dysuria and hematuria.  Skin: Negative for pallor and rash.  Neurological: Negative for weakness (no focal weakness) and numbness.  Psychiatric/Behavioral: Negative for agitation and confusion.  All other systems reviewed and are negative.    Physical Exam Updated Vital Signs BP 135/89   Pulse 74   Temp 97.9 F (36.6 C) (Oral)   Resp 16   Ht _0  (1.626 m)   Wt 112.9 kg   LMP 06/24/2016 (Approximate)   SpO2 99%   BMI 42.74 kg/m   Physical Exam  Constitutional: She is oriented to person, place, and time. No distress.  HENT:  Head: Normocephalic and atraumatic.  Eyes: Conjunctivae and EOM are normal.  Neck: Normal range of motion. Neck supple.  Cardiovascular: Normal rate and regular rhythm.   No murmur heard. Pulmonary/Chest: She is in respiratory distress (mild). She has wheezes (end expiratory wheeze).  Diminished lung sounds bilaterally  Abdominal: Soft. She exhibits no distension. There is no tenderness.   Musculoskeletal: She exhibits edema (pitting edema in bilateral lower extremities). She exhibits no tenderness.  Neurological: She is alert and oriented to person, place, and time.  Skin: Skin is warm. She is diaphoretic. No pallor.  Psychiatric: She has a normal mood and affect.  Nursing note and vitals reviewed.    ED Treatments / Results  Labs (all labs ordered are listed, but only abnormal results are displayed) Labs Reviewed  BASIC METABOLIC PANEL - Abnormal; Notable for the following:       Result Value   Chloride 100 (*)    BUN <5 (*)    Calcium 8.5 (*)    All other components within normal limits  CBC - Abnormal; Notable for the following:    RBC 5.98 (*)    HCT 47.6 (*)    MCH 23.4 (*)    MCHC 29.4 (*)    RDW 18.2 (*)    All other components within normal limits  BRAIN NATRIURETIC PEPTIDE  I-STAT TROPOININ, ED    EKG  EKG Interpretation  Date/Time:  Saturday July 04 2016 13:58:13 EST Ventricular Rate:  86 PR Interval:  180 QRS Duration: 78 QT Interval:  398 QTC Calculation: 476 R Axis:   -40 Text Interpretation:  Sinus rhythm Left axis deviation Low voltage QRS Confirmed by Ashok Cordia  MD, Lennette Bihari ([id number]) on 07/04/2016 3:52:25 PM       Radiology Dg Chest Portable 1 View  Result Date: 07/04/2016 CLINICAL DATA:  Shortness of breath, chest pain, smoker EXAM: PORTABLE CHEST 1 VIEW COMPARISON:  03/29/2016 FINDINGS: Stable cardiomegaly and central vascular congestion. Limited exam because of portable technique and body habitus. No definite superimposed pneumonia, collapse or consolidation. Negative for edema, large effusion or pneumothorax. Trachea is midline. IMPRESSION: Cardiomegaly with vascular congestion.  Stable exam. Electronically Signed   By: Jerilynn Mages.  Shick M.D.   On: 07/04/2016 15:02    Procedures Procedures (including critical care time)  Medications Ordered in ED Medications  ipratropium-albuterol (DUONEB) 0.5-2.5 (3) MG/3ML nebulizer solution 3 mL (3 mLs  Nebulization Given 07/04/16 1532)  predniSONE (DELTASONE) tablet 60 mg (60 mg Oral Given 07/04/16 1532)  furosemide (LASIX) injection 40 mg (40 mg  Intravenous Given 07/04/16 1656)     Initial Impression / Assessment and Plan / ED Course  I have reviewed the triage vital signs and the nursing notes.  Pertinent labs & imaging results that were available during my care of the patient were reviewed by me and considered in my medical decision making (see chart for details).  Clinical Course     Patient presents today with increasing dyspnea on exertion. Has an increased oxygen requirement noted today and is currently satting in the 90s on 5-6 L of oxygen. Chest x-ray today showed interstitial edema and the patient does have noted prior history of diastolic dysfunction. Presentation seems consistent with mixed presentation of exacerbation of chronic lung disease and congestive heart failure. Patient was given IV Lasix here. Given steroid burst and nebulizer treatment.  Discussed with medicine who is agreeable to admit the patient to telemetry. Patient is stable at the time of admission.  Final Clinical Impressions(s) / ED Diagnoses   Final diagnoses:  Increased oxygen demand  Dyspnea on exertion  Acute diastolic congestive heart failure [facility])    New Prescriptions New Prescriptions   No medications on file     Theodosia Quay, MD 07/04/16 [place], MD 07/04/16 2150

## 2016-07-04 NOTE — Progress Notes (Signed)
Humidity bottle added to nasal cannula.

## 2016-07-05 DIAGNOSIS — J84115 Respiratory bronchiolitis interstitial lung disease: Principal | ICD-10-CM

## 2016-07-05 DIAGNOSIS — R0689 Other abnormalities of breathing: Secondary | ICD-10-CM

## 2016-07-05 DIAGNOSIS — F1010 Alcohol abuse, uncomplicated: Secondary | ICD-10-CM

## 2016-07-05 DIAGNOSIS — J441 Chronic obstructive pulmonary disease with (acute) exacerbation: Secondary | ICD-10-CM

## 2016-07-05 LAB — HEMOGLOBIN A1C
Hgb A1c MFr Bld: 6.2 % — ABNORMAL HIGH (ref 4.8–5.6)
Mean Plasma Glucose: 131 mg/dL

## 2016-07-05 LAB — RESPIRATORY PANEL BY PCR
Adenovirus: NOT DETECTED
BORDETELLA PERTUSSIS-RVPCR: NOT DETECTED
CHLAMYDOPHILA PNEUMONIAE-RVPPCR: NOT DETECTED
CORONAVIRUS OC43-RVPPCR: NOT DETECTED
Coronavirus 229E: NOT DETECTED
Coronavirus HKU1: NOT DETECTED
Coronavirus NL63: NOT DETECTED
INFLUENZA A-RVPPCR: NOT DETECTED
Influenza B: NOT DETECTED
MYCOPLASMA PNEUMONIAE-RVPPCR: NOT DETECTED
Metapneumovirus: NOT DETECTED
PARAINFLUENZA VIRUS 4-RVPPCR: NOT DETECTED
Parainfluenza Virus 1: NOT DETECTED
Parainfluenza Virus 2: NOT DETECTED
Parainfluenza Virus 3: NOT DETECTED
RESPIRATORY SYNCYTIAL VIRUS-RVPPCR: NOT DETECTED
Rhinovirus / Enterovirus: NOT DETECTED

## 2016-07-05 LAB — TROPONIN I

## 2016-07-05 MED ORDER — FUROSEMIDE 10 MG/ML IJ SOLN
40.0000 mg | Freq: Every day | INTRAMUSCULAR | Status: DC
  Administered 2016-07-05 – 2016-07-07 (×3): 40 mg via INTRAVENOUS
  Filled 2016-07-05 (×3): qty 4

## 2016-07-05 NOTE — Progress Notes (Signed)
[facility]                                                                              Patient Demographics  Diane Gilbert, is a 51 y.o. female, DOB - [birth year], MWN:[id number]  Admit date - 07/04/2016   Admitting Physician Reyne Dumas, MD  Outpatient Primary MD for the patient is Philis Fendt, MD  Outpatient specialists:   LOS - 1  days    Chief Complaint  Patient presents with  . Fall  . Weakness       Brief summary   Patient is a 51 year old female with schizophrenia, hypertension, tobacco abuse, chronic interstitial lung disease on 2 L O2 at baseline, chronic respiratory failure presented to ED with dyspnea and hypoxia. O2 sats were 70s on arrival to ER on 2 L. She was placed on 6 L of oxygen and was treated with nebs and Lasix and prednisone. Patient was admitted for further workup. Pt also reported increasing shortness of breath with cough, reported difficulty coughing up phlegm.   Assessment & Plan    Principal Problem:   Acute on chronic respiratory failure with hypoxia secondary to COPD with acute exacerbation (HCC) with underlying history of chronic interstitial lung disease - Improving, this morning, O2 weaned to 3 L - Patient was placed on Solu-Medrol, duo nebs - Continue IV Zithromax, Rocephin - Influenza PCR negative, respiratory virus panel negative - Troponins negative - Patient had a 2-D echo in 03/2016 which had shown EF of 65-70% with grade 2 diastolic dysfunction.   Mild acute on chronic diastolic CHF exacerbation - Received Lasix 40 mg IV 1 in ED with negative balance of 1.4 L, chest x-ray had shown cardiomegaly with vascular congestion - will continue Lasix 40 mg IV daily, reassess in a.m. - Strict I's and O's, daily weights   Respiratory bronchiolitis interstitial lung disease ([place])  - Followed by [facility]  - Presented with significant respiratory symptoms  in 2005, underwent extensive w/u including  inconclusive transbronchial bx and eventual VATS for wedge-biopsy in Jan 2016 with diagnosis of severe RB-ILD  - [place] of tx is smoking-cessation; pt has cut-back dramatically, but has been difficult to quit - Nicotine patch provided  - Continue supplemental O2, wean as able     Schizophrenia ([place]) - Currently stable, denies any active SI, HI, or hallucinations  - Continue current management Seroquel, Abilify, trazodone, Wellbutrin  Code Status: Full code DVT Prophylaxis:  Lovenox  Family Communication: Discussed in detail with the patient, all imaging results, lab results explained to the patient and family members at bedside   Disposition Plan: Hopefully DC next 24-48 hours  Time Spent in minutes   25 minutes  Procedures:    Consultants:   None  Antimicrobials:   IV Zithromax  1/6 >   IV Rocephin  1/6>   Medications  Scheduled Meds: . ARIPiprazole  20 mg Oral Daily  . buPROPion  150 mg Oral BID  . cefTRIAXone (ROCEPHIN)  IV  1 g Intravenous Q24H  . enoxaparin (LOVENOX) injection  40 mg Subcutaneous Q24H  . folic acid  1 mg Oral Daily  .  guaiFENesin  600 mg Oral BID  . levalbuterol  0.63 mg Nebulization QID  . methylPREDNISolone (SOLU-MEDROL) injection  60 mg Intravenous Q6H   Followed by  . [START ON 07/06/2016] predniSONE  50 mg Oral Q breakfast  . QUEtiapine  300 mg Oral QHS  . thiamine  100 mg Oral Daily  . traZODone  100 mg Oral QHS   Continuous Infusions: PRN Meds:.acetaminophen **OR** acetaminophen, ondansetron **OR** ondansetron (ZOFRAN) IV   Antibiotics   Anti-infectives    Start     Dose/Rate Route Frequency Ordered Stop   07/04/16 1900  azithromycin (ZITHROMAX) tablet 500 mg  Status:  Discontinued    Comments:  1 tab po daily x 1 more dose, zero refills. To be taken on 03/31/16     500 mg Oral Daily 07/04/16 1847 07/04/16 2017   07/04/16 1745  cefTRIAXone (ROCEPHIN) 1 g in dextrose 5 % 50 mL IVPB     1 g 100 mL/hr over 30 Minutes Intravenous  Every 24 hours 07/04/16 1744          Subjective:   Diane Gilbert was seen and examined today. O2 weaned down to 3 L, feeling somewhat better. patient denies dizziness, chest pain, shortness of breath, abdominal pain, N/V/D/C, new weakness, numbess, tingling. No acute events overnight.  No fevers or chills  Objective:   Vitals:   07/05/16 0631 07/05/16 0720 07/05/16 0955 07/05/16 1000  BP: 129/73     Pulse: (!) 109     Resp: 20     Temp: 97.8 F (36.6 C)     TempSrc: Oral     SpO2: 92% 91% (!) 82% 98%  Weight: 110.6 kg (243 lb 12.8 oz)     Height:        Intake/Output Summary (Last 24 hours) at 07/05/16 1110 Last data filed at 07/05/16 0900  Gross per 24 hour  Intake              770 ml  Output             2250 ml  Net            -1480 ml     Wt Readings from Last 3 Encounters:  07/05/16 110.6 kg (243 lb 12.8 oz)  03/24/16 109.8 kg (242 lb)  01/16/16 110.7 kg (244 lb)     Exam  General: Alert and oriented x 3, NAD  HEENT:    Neck: Supple, no JVD  Cardiovascular: S1 S2 auscultated, no rubs, murmurs or gallops. Regular rate and rhythm.  Respiratory: Decreased breath sounds at the bases  Gastrointestinal: Obese, Soft, nontender, nondistended, + bowel sounds  Ext: no cyanosis clubbing or edema  Neuro: AAOx3, Cr N's II- XII. Strength 5/5 upper and lower extremities bilaterally  Skin: No rashes  Psych: Normal affect and demeanor, alert and oriented x3    Data Reviewed:  I have personally reviewed following labs and imaging studies  Micro Results Recent Results (from the past 240 hour(s))  Respiratory Panel by PCR     Status: None   Collection Time: 07/04/16  7:11 PM  Result Value Ref Range Status   Adenovirus NOT DETECTED NOT DETECTED Final   Coronavirus 229E NOT DETECTED NOT DETECTED Final   Coronavirus HKU1 NOT DETECTED NOT DETECTED Final   Coronavirus NL63 NOT DETECTED NOT DETECTED Final   Coronavirus OC43 NOT DETECTED NOT DETECTED Final    Metapneumovirus NOT DETECTED NOT DETECTED Final   Rhinovirus / Enterovirus NOT DETECTED NOT  DETECTED Final   Influenza A NOT DETECTED NOT DETECTED Final   Influenza B NOT DETECTED NOT DETECTED Final   Parainfluenza Virus 1 NOT DETECTED NOT DETECTED Final   Parainfluenza Virus 2 NOT DETECTED NOT DETECTED Final   Parainfluenza Virus 3 NOT DETECTED NOT DETECTED Final   Parainfluenza Virus 4 NOT DETECTED NOT DETECTED Final   Respiratory Syncytial Virus NOT DETECTED NOT DETECTED Final   Bordetella pertussis NOT DETECTED NOT DETECTED Final   Chlamydophila pneumoniae NOT DETECTED NOT DETECTED Final   Mycoplasma pneumoniae NOT DETECTED NOT DETECTED Final    Radiology Reports Dg Chest Portable 1 View  Result Date: 07/04/2016 CLINICAL DATA:  Shortness of breath, chest pain, smoker EXAM: PORTABLE CHEST 1 VIEW COMPARISON:  03/29/2016 FINDINGS: Stable cardiomegaly and central vascular congestion. Limited exam because of portable technique and body habitus. No definite superimposed pneumonia, collapse or consolidation. Negative for edema, large effusion or pneumothorax. Trachea is midline. IMPRESSION: Cardiomegaly with vascular congestion.  Stable exam. Electronically Signed   By: Jerilynn Mages.  Shick M.D.   On: 07/04/2016 15:02    Lab Data:  CBC:  Recent Labs Lab 07/04/16 1510 07/04/16 1758  WBC 9.6 7.3  HGB 14.0 14.7  HCT 47.6* 48.8*  MCV 79.6 79.2  PLT 254 419   Basic Metabolic Panel:  Recent Labs Lab 07/04/16 1510 07/04/16 1758  NA 135  --   K 4.2  --   CL 100*  --   CO2 27  --   GLUCOSE 84  --   BUN <5*  --   CREATININE 0.82 0.85  CALCIUM 8.5*  --   MG  --  2.0   GFR: Estimated Creatinine Clearance: 98.5 mL/min (by C-G formula based on SCr of 0.85 mg/dL). Liver Function Tests: No results for input(s): AST, ALT, ALKPHOS, BILITOT, PROT, ALBUMIN in the last 168 hours. No results for input(s): LIPASE, AMYLASE in the last 168 hours. No results for input(s): AMMONIA in the last 168  hours. Coagulation Profile: No results for input(s): INR, PROTIME in the last 168 hours. Cardiac Enzymes:  Recent Labs Lab 07/04/16 1758 07/04/16 2301 07/05/16 0651  TROPONINI <0.03 <0.03 <0.03   BNP (last 3 results)  Recent Labs  09/06/15 1404  PROBNP 12.0   HbA1C: No results for input(s): HGBA1C in the last 72 hours. CBG: No results for input(s): GLUCAP in the last 168 hours. Lipid Profile: No results for input(s): CHOL, HDL, LDLCALC, TRIG, CHOLHDL, LDLDIRECT in the last 72 hours. Thyroid Function Tests: No results for input(s): TSH, T4TOTAL, FREET4, T3FREE, THYROIDAB in the last 72 hours. Anemia Panel: No results for input(s): VITAMINB12, FOLATE, FERRITIN, TIBC, IRON, RETICCTPCT in the last 72 hours. Urine analysis:    Component Value Date/Time   COLORURINE AMBER (A) 03/24/2016 1530   APPEARANCEUR CLOUDY (A) 03/24/2016 1530   LABSPEC 1.024 03/24/2016 1530   PHURINE 6.0 03/24/2016 1530   GLUCOSEU NEGATIVE 03/24/2016 1530   HGBUR NEGATIVE 03/24/2016 1530   BILIRUBINUR NEGATIVE 03/24/2016 1530   KETONESUR NEGATIVE 03/24/2016 1530   PROTEINUR 30 (A) 03/24/2016 1530   UROBILINOGEN 0.2 07/13/2014 1122   NITRITE NEGATIVE 03/24/2016 1530   LEUKOCYTESUR NEGATIVE 03/24/2016 [place] M.D. [facility] 07/05/2016, 11:10 AM  Pager: [phone] Between 7am to 7pm - call Pager - [phone]  After 7pm go to [email or web address] - password [email or web address]  Call night coverage person covering after 7pm

## 2016-07-05 NOTE — Progress Notes (Signed)
Flu negative as well as respiratory panel.  Droplet precautions discontinued as per protocol.

## 2016-07-05 NOTE — Progress Notes (Signed)
Decreased patients O2 from 6L to 3l. Patient sats 96% on 3L Patient had no breathing issues overnight. Will continue to monitor patient. Danae Chen Lillyann Ahart Rn 07/05/2016 5:27 AM

## 2016-07-05 NOTE — Progress Notes (Signed)
RN walked in room to administer medications and patient was standing at the sink saying she needed help taking her tele off so she could take a bath.  Not wearing oxygen.  O2 sat 82% with oxygen off.  RN had patient sit down, reapplied oxygen and added two extensions to tubing so tubing will reach around room.  Reiterated to patient that she needs to call for assistance and not to take her oxygen off, that she must wear it at all times.

## 2016-07-06 LAB — BASIC METABOLIC PANEL
ANION GAP: 6 (ref 5–15)
BUN: 11 mg/dL (ref 6–20)
CO2: 34 mmol/L — ABNORMAL HIGH (ref 22–32)
Calcium: 9.3 mg/dL (ref 8.9–10.3)
Chloride: 98 mmol/L — ABNORMAL LOW (ref 101–111)
Creatinine, Ser: 1.02 mg/dL — ABNORMAL HIGH (ref 0.44–1.00)
GFR calc Af Amer: >60 mL/min (ref >60)
GLUCOSE: 191 mg/dL — AB (ref 65–99)
Potassium: 3.8 mmol/L (ref 3.5–5.1)
Sodium: 138 mmol/L (ref 135–145)

## 2016-07-06 LAB — BLOOD GAS, ARTERIAL
Acid-Base Excess: 9.3 mmol/L — ABNORMAL HIGH (ref 0.0–2.0)
Bicarbonate: 35 mmol/L — ABNORMAL HIGH (ref 20.0–28.0)
DRAWN BY: 331001
O2 CONTENT: 3 L/min
O2 SAT: 95.2 %
PATIENT TEMPERATURE: 98.6
PO2 ART: 84.7 mmHg (ref 83.0–108.0)
pCO2 arterial: 65.7 mmHg (ref 32.0–48.0)
pH, Arterial: 7.347 — ABNORMAL LOW (ref 7.350–7.450)

## 2016-07-06 MED ORDER — LEVALBUTEROL HCL 0.63 MG/3ML IN NEBU
0.6300 mg | INHALATION_SOLUTION | Freq: Three times a day (TID) | RESPIRATORY_TRACT | Status: DC
  Administered 2016-07-06 – 2016-07-07 (×3): 0.63 mg via RESPIRATORY_TRACT
  Filled 2016-07-06 (×3): qty 3

## 2016-07-06 MED ORDER — LEVALBUTEROL HCL 0.63 MG/3ML IN NEBU: 0.6300 mg | INHALATION_SOLUTION | RESPIRATORY_TRACT | Status: DC | PRN

## 2016-07-06 NOTE — Progress Notes (Signed)
CRITICAL VALUE ALERT  Critical value received:  CO2 65.7   Date of notification: 07/06/16  Time of notification:  10:05 a.m  Critical value read back: yes Nurse who received alert:  Shelton Silvas, RN   MD notified (1st page):  Rai   Time of first page:  10:15 a.m.   MD notified (2nd page):  Time of second page:  Responding MD:  Tana Coast   Time MD responded:  10:20 a.m.

## 2016-07-06 NOTE — Progress Notes (Signed)
[facility]                                                                              Patient Demographics  Diane Gilbert, is a 51 y.o. female, DOB - 1965/08/30, QZE:[id number]  Admit date - 07/04/2016   Admitting Physician Reyne Dumas, MD  Outpatient Primary MD for the patient is Philis Fendt, MD  Outpatient specialists:   LOS - 2  days    Chief Complaint  Patient presents with  . Fall  . Weakness       Brief summary   Patient is a 51 year old female with schizophrenia, hypertension, tobacco abuse, chronic interstitial lung disease on 2 L O2 at baseline, chronic respiratory failure presented to ED with dyspnea and hypoxia. O2 sats were 70s on arrival to ER on 2 L. She was placed on 6 L of oxygen and was treated with nebs and Lasix and prednisone. Patient was admitted for further workup. Pt also reported increasing shortness of breath with cough, reported difficulty coughing up phlegm.   Assessment & Plan    Principal Problem:   Acute on chronic respiratory failure with hypoxia secondary to COPD with acute exacerbation (HCC) with underlying history of chronic interstitial lung disease - Patient was placed on Solu-Medrol, duo nebs - Continue IV Zithromax, Rocephin - Influenza PCR negative, respiratory virus panel negative - Troponins negative - Patient had a 2-D echo in 03/2016 which had shown EF of 65-70% with grade 2 diastolic dysfunction. - This morning, patient noticed to be more lethargic and sleepy, stat ABG was obtained which showed pH of 7.3 with PCO2 65.7, bicarbonate 35. However subsequently patient became alert and awake, taking her bath by herself. Likely she has respiratory compensation due to chronic lung disease, has chronic respiratory failure and obesity hypoventilation - I have stopped bedtime trazodone. Hold off on BiPAP for now as patient is alert, awake and oriented. Discussed in detail with respiratory, will obtain ABG if she  became lethargic again. Patient reports that she has obstructive sleep apnea, will try CPAP trial tonight and obtain ABG at 7 AM   Mild acute on chronic diastolic CHF exacerbation - Received Lasix 40 mg IV 1 in ED with negative balance of 1.4 L, chest x-ray had shown cardiomegaly with vascular congestion - will continue Lasix 40 mg IV daily, creatinine stable  -Negative balance of 4 L, weight down from 249lbs-> 240lbs     Respiratory bronchiolitis interstitial lung disease ([place])  - Followed by [facility]  - Presented with significant respiratory symptoms  in 2005, underwent extensive w/u including inconclusive transbronchial bx and eventual VATS for wedge-biopsy in Jan 2016 with diagnosis of severe RB-ILD  - [place] of tx is smoking-cessation; pt has cut-back dramatically, but has been difficult to quit - Nicotine patch provided  - Continue supplemental O2, wean as able     Schizophrenia ([place]) - Currently stable, denies any active SI, HI, or hallucinations  - Continue current management Seroquel, Abilify, Wellbutrin - Trazodone discontinued  Code Status: Full code DVT Prophylaxis:  Lovenox  Family Communication: Discussed in detail with the patient, all imaging results, lab results  explained to the patient    Disposition Plan: Hopefully DC next 24-48 hours  Time Spent in minutes   25 minutes  Procedures:    Consultants:   None  Antimicrobials:   IV Zithromax  1/6 >   IV Rocephin  1/6>   Medications  Scheduled Meds: . ARIPiprazole  20 mg Oral Daily  . buPROPion  150 mg Oral BID  . cefTRIAXone (ROCEPHIN)  IV  1 g Intravenous Q24H  . enoxaparin (LOVENOX) injection  40 mg Subcutaneous Q24H  . folic acid  1 mg Oral Daily  . furosemide  40 mg Intravenous Daily  . guaiFENesin  600 mg Oral BID  . levalbuterol  0.63 mg Nebulization TID  . predniSONE  50 mg Oral Q breakfast  . QUEtiapine  300 mg Oral QHS  . thiamine  100 mg Oral Daily   Continuous  Infusions: PRN Meds:.acetaminophen **OR** acetaminophen, levalbuterol, ondansetron **OR** ondansetron (ZOFRAN) IV   Antibiotics   Anti-infectives    Start     Dose/Rate Route Frequency Ordered Stop   07/04/16 1900  azithromycin (ZITHROMAX) tablet 500 mg  Status:  Discontinued    Comments:  1 tab po daily x 1 more dose, zero refills. To be taken on 03/31/16     500 mg Oral Daily 07/04/16 1847 07/04/16 2017   07/04/16 1745  cefTRIAXone (ROCEPHIN) 1 g in dextrose 5 % 50 mL IVPB     1 g 100 mL/hr over 30 Minutes Intravenous Every 24 hours 07/04/16 1744          Subjective:   Diane Gilbert was seen and examined today. At the time of my examination, patient was lethargic but was able to onset to verbal commands and questions. No fevers or chills. No acute shortness of breath. Stat ABG was obtained. Patient denies dizziness, chest pain, abdominal pain, N/V/D/C, new weakness, numbess, tingling. No acute events overnight.  No fevers or chills  Objective:   Vitals:   07/05/16 2119 07/05/16 2222 07/06/16 0440 07/06/16 0909  BP:  (!) 151/76 140/76   Pulse:  76 78   Resp:   18   Temp:   98.2 F (36.8 C)   TempSrc:   Oral   SpO2: 95%  96% 93%  Weight:   109.3 kg (240 lb 14.4 oz)   Height:        Intake/Output Summary (Last 24 hours) at 07/06/16 1046 Last data filed at 07/06/16 0932  Gross per 24 hour  Intake             1370 ml  Output             3650 ml  Net            -2280 ml     Wt Readings from Last 3 Encounters:  07/06/16 109.3 kg (240 lb 14.4 oz)  03/24/16 109.8 kg (242 lb)  01/16/16 110.7 kg (244 lb)     Exam  General: Lethargic but arousable   HEENT:    Neck: Supple, no JVD  Cardiovascular: S1 S2clear, RRR  Respiratory: Decreased breath sounds at the bases  Gastrointestinal: Obese, Soft, nontender, nondistended, + bowel sounds  Ext: no cyanosis clubbing or edema  Neuro: no new deficits   Skin: No rashes  Psych:lethargic  Data Reviewed:  I  have personally reviewed following labs and imaging studies  Micro Results Recent Results (from the past 240 hour(s))  Culture, blood (Routine X 2) w Reflex to ID Panel  Status: None (Preliminary result)   Collection Time: 07/04/16  5:55 PM  Result Value Ref Range Status   Specimen Description BLOOD LEFT ANTECUBITAL  Final   Special Requests BOTTLES DRAWN AEROBIC ONLY 5  BB  Final   Culture NO GROWTH < 24 HOURS  Final   Report Status PENDING  Incomplete  Culture, blood (Routine X 2) w Reflex to ID Panel     Status: None (Preliminary result)   Collection Time: 07/04/16  6:04 PM  Result Value Ref Range Status   Specimen Description BLOOD LEFT ANTECUBITAL  Final   Special Requests BOTTLES DRAWN AEROBIC ONLY 5 CC  Final   Culture NO GROWTH < 24 HOURS  Final   Report Status PENDING  Incomplete  Respiratory Panel by PCR     Status: None   Collection Time: 07/04/16  7:11 PM  Result Value Ref Range Status   Adenovirus NOT DETECTED NOT DETECTED Final   Coronavirus 229E NOT DETECTED NOT DETECTED Final   Coronavirus HKU1 NOT DETECTED NOT DETECTED Final   Coronavirus NL63 NOT DETECTED NOT DETECTED Final   Coronavirus OC43 NOT DETECTED NOT DETECTED Final   Metapneumovirus NOT DETECTED NOT DETECTED Final   Rhinovirus / Enterovirus NOT DETECTED NOT DETECTED Final   Influenza A NOT DETECTED NOT DETECTED Final   Influenza B NOT DETECTED NOT DETECTED Final   Parainfluenza Virus 1 NOT DETECTED NOT DETECTED Final   Parainfluenza Virus 2 NOT DETECTED NOT DETECTED Final   Parainfluenza Virus 3 NOT DETECTED NOT DETECTED Final   Parainfluenza Virus 4 NOT DETECTED NOT DETECTED Final   Respiratory Syncytial Virus NOT DETECTED NOT DETECTED Final   Bordetella pertussis NOT DETECTED NOT DETECTED Final   Chlamydophila pneumoniae NOT DETECTED NOT DETECTED Final   Mycoplasma pneumoniae NOT DETECTED NOT DETECTED Final    Radiology Reports Dg Chest Portable 1 View  Result Date: 07/04/2016 CLINICAL DATA:   Shortness of breath, chest pain, smoker EXAM: PORTABLE CHEST 1 VIEW COMPARISON:  03/29/2016 FINDINGS: Stable cardiomegaly and central vascular congestion. Limited exam because of portable technique and body habitus. No definite superimposed pneumonia, collapse or consolidation. Negative for edema, large effusion or pneumothorax. Trachea is midline. IMPRESSION: Cardiomegaly with vascular congestion.  Stable exam. Electronically Signed   By: Jerilynn Mages.  Shick M.D.   On: 07/04/2016 15:02    Lab Data:  CBC:  Recent Labs Lab 07/04/16 1510 07/04/16 1758  WBC 9.6 7.3  HGB 14.0 14.7  HCT 47.6* 48.8*  MCV 79.6 79.2  PLT 254 355   Basic Metabolic Panel:  Recent Labs Lab 07/04/16 1510 07/04/16 1758 07/06/16 0955  NA 135  --  138  K 4.2  --  3.8  CL 100*  --  98*  CO2 27  --  34*  GLUCOSE 84  --  191*  BUN <5*  --  11  CREATININE 0.82 0.85 1.02*  CALCIUM 8.5*  --  9.3  MG  --  2.0  --    GFR: Estimated Creatinine Clearance: 81.5 mL/min (by C-G formula based on SCr of 1.02 mg/dL (H)). Liver Function Tests: No results for input(s): AST, ALT, ALKPHOS, BILITOT, PROT, ALBUMIN in the last 168 hours. No results for input(s): LIPASE, AMYLASE in the last 168 hours. No results for input(s): AMMONIA in the last 168 hours. Coagulation Profile: No results for input(s): INR, PROTIME in the last 168 hours. Cardiac Enzymes:  Recent Labs Lab 07/04/16 1758 07/04/16 2301 07/05/16 0651  TROPONINI <0.03 <0.03 <0.03   BNP (  last 3 results)  Recent Labs  09/06/15 1404  PROBNP 12.0   HbA1C:  Recent Labs  07/04/16 1758  HGBA1C 6.2*   CBG: No results for input(s): GLUCAP in the last 168 hours. Lipid Profile: No results for input(s): CHOL, HDL, LDLCALC, TRIG, CHOLHDL, LDLDIRECT in the last 72 hours. Thyroid Function Tests: No results for input(s): TSH, T4TOTAL, FREET4, T3FREE, THYROIDAB in the last 72 hours. Anemia Panel: No results for input(s): VITAMINB12, FOLATE, FERRITIN, TIBC, IRON,  RETICCTPCT in the last 72 hours. Urine analysis:    Component Value Date/Time   COLORURINE AMBER (A) 03/24/2016 1530   APPEARANCEUR CLOUDY (A) 03/24/2016 1530   LABSPEC 1.024 03/24/2016 1530   PHURINE 6.0 03/24/2016 1530   GLUCOSEU NEGATIVE 03/24/2016 1530   HGBUR NEGATIVE 03/24/2016 1530   BILIRUBINUR NEGATIVE 03/24/2016 1530   KETONESUR NEGATIVE 03/24/2016 1530   PROTEINUR 30 (A) 03/24/2016 1530   UROBILINOGEN 0.2 07/13/2014 1122   NITRITE NEGATIVE 03/24/2016 1530   LEUKOCYTESUR NEGATIVE 03/24/2016 [place] M.D. [facility] 07/06/2016, 10:46 AM  Pager: [phone] Between 7am to 7pm - call Pager - [phone]  After 7pm go to [email or web address] - password [email or web address]  Call night coverage person covering after 7pm

## 2016-07-06 NOTE — Progress Notes (Signed)
Patient refuses to wear CPAP.  She states that she took the mask off during titration study due to poor tolerance.  She does not wish to receive education and states that she will not be changing her mind tonight.  RT will continue to monitor.

## 2016-07-06 NOTE — Progress Notes (Signed)
Nurse Tech notified RN that patient had a large bag of chips in her room.  RN did indeed find a large multi-bag supply of various chips in her room.  Educated patient that we are giving her IV lasix for fluid overload and if she eats chips they are high in sodium and will cause her to retain fluid.  Patient verbalized understanding and states she will not eat these.  Says her honey brought them to her.  I encouraged her to have them bring her fruit or other healthy snacks, not chips.

## 2016-07-06 NOTE — Progress Notes (Signed)
Patient slow to wake up this morning however once awake, took bath and spent the majority of the day sitting up in chair watching TV.  Husband in to visit.  Patient also ambulated entire length of unit with RN, no increased shortness of breath.  Patient hopes to go home tomorrow.

## 2016-07-07 LAB — CBC
HEMATOCRIT: 51.8 % — AB (ref 36.0–46.0)
HEMOGLOBIN: 15.1 g/dL — AB (ref 12.0–15.0)
MCH: 23.1 pg — AB (ref 26.0–34.0)
MCHC: 29.2 g/dL — ABNORMAL LOW (ref 30.0–36.0)
MCV: 79.3 fL (ref 78.0–100.0)
Platelets: 265 10*3/uL (ref 150–400)
RBC: 6.53 MIL/uL — AB (ref 3.87–5.11)
RDW: 18.5 % — ABNORMAL HIGH (ref 11.5–15.5)
WBC: 14.1 10*3/uL — ABNORMAL HIGH (ref 4.0–10.5)

## 2016-07-07 LAB — BASIC METABOLIC PANEL
ANION GAP: 10 (ref 5–15)
BUN: 11 mg/dL (ref 6–20)
CHLORIDE: 98 mmol/L — AB (ref 101–111)
CO2: 32 mmol/L (ref 22–32)
Calcium: 9.8 mg/dL (ref 8.9–10.3)
Creatinine, Ser: 0.85 mg/dL (ref 0.44–1.00)
GFR calc non Af Amer: >60 mL/min (ref >60)
GLUCOSE: 88 mg/dL (ref 65–99)
Potassium: 3.9 mmol/L (ref 3.5–5.1)
Sodium: 140 mmol/L (ref 135–145)

## 2016-07-07 MED ORDER — CEFUROXIME AXETIL 500 MG PO TABS: 500.0000 mg | ORAL_TABLET | Freq: Two times a day (BID) | ORAL | 0 refills | Status: DC

## 2016-07-07 MED ORDER — BUDESONIDE-FORMOTEROL FUMARATE 160-4.5 MCG/ACT IN AERO: 2.0000 | INHALATION_SPRAY | Freq: Two times a day (BID) | RESPIRATORY_TRACT | 3 refills | Status: DC

## 2016-07-07 MED ORDER — FUROSEMIDE 40 MG PO TABS: 40.0000 mg | ORAL_TABLET | Freq: Every day | ORAL | 3 refills | Status: DC

## 2016-07-07 MED ORDER — PREDNISONE 10 MG PO TABS: ORAL_TABLET | ORAL | 0 refills | Status: DC

## 2016-07-07 MED ORDER — CEFUROXIME AXETIL 500 MG PO TABS
500.0000 mg | ORAL_TABLET | Freq: Two times a day (BID) | ORAL | Status: DC
  Administered 2016-07-07: 500 mg via ORAL
  Filled 2016-07-07: qty 1

## 2016-07-07 MED ORDER — GUAIFENESIN ER 600 MG PO TB12: 600.0000 mg | ORAL_TABLET | Freq: Two times a day (BID) | ORAL | 0 refills | Status: DC

## 2016-07-07 NOTE — Progress Notes (Signed)
CM talked to patient, she is on home oxygen through [place] and she has her portable oxygen tank from home in the room at the bedside; Aneta Mins [phone]

## 2016-07-07 NOTE — Progress Notes (Signed)
Heart Failure Navigator Consult Note  Presentation: Diane Gilbert is a 51 y.o.femalewith medical history significant for schizophrenia, hypertension, tobacco abuse, and chronic interstitial lung disease On 2 L of oxygen at baseline,who presents the emergency department for evaluation of dyspnea and hypoxia at rest. She reports worsening in her chronic dyspnea Since Thursday. Dyspnea has been worsening slowly over the last 1 month.  This morning, her respiratory status had worsened further and she was dyspneic at rest. Oxygen saturation found to be in the 70s on arrival to the ER on 2 L of oxygen.Currently requiring 6 L of oxygen.  She has been treated with duo nebs, Prednisone 60 mg, Lasix 40 mg IV,  Continues to be short of breath. Patient notes some recent chills, but denies fevers per se. She denies chest pain or palpitations, and denies lower extremity edema or orthopnea. There has been no recent travel or sick contacts.  Apparently also reported  multiple falls over the past few days, pt reports her arms and legs give out on her, only complains of pain to R leg. Pt also reports increasing shortness of breath with cough, reports difficulty coughing up phlegm. O2 sat: 79 with pt on 2 liters O2.   Past Medical History:  Diagnosis Date  . Alcohol abuse   . Anxiety   . Arthritis   . Depression   . Headache(784.0)   . Hypertension   . Insomnia   . Interstitial lung disease ([place])   . Oxygen dependent   . Schizophrenia ([place])   . Shortness of breath dyspnea   . Sleep apnea     Social History   Social History  . Marital status: Single    Spouse name: N/A  . Number of children: N/A  . Years of education: N/A   Occupational History  . unemployed    Social History Main Topics  . Smoking status: Former Smoker    Packs/day: 1.50    Years: 30.00    Types: Cigarettes  . Smokeless tobacco: Never Used     Comment: smoking 3 cigarettes daily "every now and then" 01/16/16  .  Alcohol use 0.0 oz/week     Comment: Pt has previously abused alcohol , pt states she still drinks "every now and then"  . Drug use: No  . Sexual activity: Not Currently     Comment: husband locked up    Other Topics Concern  . None   Social History Narrative  . None    ECHO:Study Conclusions-03/29/16  - Left ventricle: The cavity size was normal. Wall thickness was   increased in a pattern of mild LVH. Systolic function was   vigorous. The estimated ejection fraction was in the range of 65%   to 70%. Wall motion was normal; there were no regional wall   motion abnormalities. Features are consistent with a pseudonormal   left ventricular filling pattern, with concomitant abnormal   relaxation and increased filling pressure (grade 2 diastolic   dysfunction). - Mitral valve: There was trivial regurgitation. - Left atrium: The atrium was at the upper limits of normal in   size. - Tricuspid valve: There was trivial regurgitation. - Pulmonary arteries: Systolic pressure could not be accurately   estimated. - Pericardium, extracardiac: There was no pericardial effusion.  Impressions:  - Mild LVH with LVEF 65-70%. Grade 2 diastolic dysfunction with   increased LV filling pressure. Upper normal left atrial chamber   size. Trivial mitral regurgitation and tricuspid regurgitation.   BNP  Component Value Date/Time   BNP 90.8 07/04/2016 1510    ProBNP    Component Value Date/Time   PROBNP 12.0 09/06/2015 1404     Education Assessment and Provision:  Detailed education and instructions provided on heart failure disease management including the following:  Signs and symptoms of Heart Failure When to call the physician Importance of daily weights Low sodium diet Fluid restriction Medication management Anticipated future follow-up appointments  Patient education given on each of the above topics.  Patient acknowledges understanding and acceptance of all  instructions.  I spoke with patient regarding her current admission and HF diagnosis.  She tells me that she was not aware that she had HF.  She did not have a scale and therefore I have provided one for home use.  I discussed the importance of daily weights and when to contact the physician.  I also reviewed a low sodium diet and high sodium foods to avoid.  She denies any issues with getting or taking prescribed medications.  She does not have a cardiologist and will follow with Dr. Jeanie Cooks.  She lives alone in [place].   Education Materials:  "Living Better With Heart Failure" Booklet, Daily Weight Tracker Tool    High Risk Criteria for Readmission and/or Poor Patient Outcomes:   EF <30%- No 65-70% with grade 2 dias dys  2 or more admissions in 6 months- Yes  Difficult social situation- No  Demonstrates medication noncompliance- denies   Barriers of Care:  Health literacy, knowledge and compliance.  Discharge Planning:  Plans to return to home alone in [place].

## 2016-07-07 NOTE — Progress Notes (Signed)
SATURATION QUALIFICATIONS: (This note is used to comply with regulatory documentation for home oxygen)   Patient Saturations on Room Air while Ambulating = 77%  Patient Saturations on 2 Liters of oxygen while Ambulating = 89%  Patient Saturations on 3 Liters of oxygen while Ambulating = 91%  Please briefly explain why patient needs home oxygen:desaturates with activity  Alben Deeds, [place] DPT [phone]

## 2016-07-07 NOTE — Discharge Summary (Signed)
Physician Discharge Summary   Patient ID: Diane Gilbert MRN: [id number] DOB/AGE: [birth year] 51 y.o.  Admit date: 07/04/2016 Discharge date: 07/07/2016  Primary Care Physician:  Philis Fendt, MD  Discharge Diagnoses:    . Acute on chronic hypoxic and hypercapnic respiratory failure . Alcohol abuse . Respiratory bronchiolitis interstitial lung disease ([place]) . Schizophrenia ([place]) . Acute exacerbation of chronic obstructive pulmonary disease (COPD) ([place])   Consults:  None  Recommendations for Outpatient Follow-up:  1. Patient may benefit from sleep study 2. Please repeat CBC/BMET at next visit 3. Trazodone discontinued   DIET: Heart healthy diet    Allergies:  No Known Allergies   DISCHARGE MEDICATIONS: Discharge Medication List as of 07/07/2016 12:11 PM    START taking these medications   Details  cefUROXime (CEFTIN) 500 MG tablet Take 1 tablet (500 mg total) by mouth 2 (two) times daily with a meal. X 5days, Starting Tue 07/07/2016, Print    guaiFENesin (MUCINEX) 600 MG 12 hr tablet Take 1 tablet (600 mg total) by mouth 2 (two) times daily., Starting Tue 07/07/2016, Print    predniSONE (DELTASONE) 10 MG tablet Prednisone dosing: Take  Prednisone 22m (4 tabs) x 3 days, then taper to 351m(3 tabs) x 3 days, then 2037m2 tabs) x 3days, then 80m24m tab) x 3days, then OFF.  Dispense:  30 tabs, refills: None, Print      CONTINUE these medications which have CHANGED   Details  budesonide-formoterol (SYMBICORT) 160-4.5 MCG/ACT inhaler Inhale 2 puffs into the lungs 2 (two) times daily., Starting Tue 07/07/2016, Print    furosemide (LASIX) 40 MG tablet Take 1 tablet (40 mg total) by mouth daily., Starting Tue 07/07/2016, Print      CONTINUE these medications which have NOT CHANGED   Details  albuterol (PROVENTIL HFA;VENTOLIN HFA) 108 (90 BASE) MCG/ACT inhaler Inhale 2 puffs into the lungs every 6 (six) hours as needed for wheezing or shortness of breath., Starting Thu  05/09/2015, Print    ARIPiprazole (ABILIFY) 20 MG tablet Take 20 mg by mouth daily., Historical Med    buPROPion (WELLBUTRIN SR) 150 MG 12 hr tablet Take 150 mg by mouth See admin instructions. Take 1 tablet (150 mg) by mouth every morning and at noon, Historical Med    OXYGEN Inhale 2 L into the lungs continuous., Historical Med    QUEtiapine (SEROQUEL) 300 MG tablet Take 300 mg by mouth at bedtime., Historical Med      STOP taking these medications     amLODipine (NORVASC) 5 MG tablet      docusate sodium (COLACE) 100 MG capsule      gabapentin (NEURONTIN) 300 MG capsule      ibuprofen (ADVIL,MOTRIN) 200 MG tablet      ketorolac (TORADOL) 10 MG tablet      oxyCODONE-acetaminophen (PERCOCET/ROXICET) 5-325 MG per tablet      traZODone (DESYREL) 150 MG tablet          Brief H and P: For complete details please refer to admission H and P, but in brief Patient is a 51 y38r old female with schizophrenia, hypertension, tobacco abuse, chronic interstitial lung disease on 2 L O2 at baseline, chronic respiratory failure presented to ED with dyspnea and hypoxia. O2 sats were 70s on arrival to ER on 2 L. She was placed on 6 L of oxygen and was treated with nebs and Lasix and prednisone. Patient was admitted for further workup.Pt also reported increasing shortness of breath with cough, reported difficulty coughing  up phlegm.  Hospital Course:   Acute on chronic respiratory failure with hypoxia secondary to COPD with acute exacerbation (HCC) with underlying history of chronic interstitial lung disease - Patient was placed on Solu-Medrol, duo nebs, IV Rocephin - Patient was transitioned to oral Ceftin for 5 more days and prednisone with taper - Influenza PCR negative, respiratory virus panel negative - Troponins negative - Patient had a 2-D echo in 03/2016 which had shown EF of 65-70% with grade 2 diastolic dysfunction. - On the morning of 1/8,  patient noticed to be more lethargic and  sleepy, stat ABG showed pH of 7.3 with PCO2 65.7, bicarbonate 35. However subsequently patient became alert and awake, taking her bath by herself, without any interventions. Likely she has respiratory compensation due to chronic lung disease, has chronic respiratory failure and obesity hypoventilation - I have stopped bedtime trazodone. Recommend outpatient sleep study and patient may need CPAP at night. CPAP was tried in the hospital but she refused to wear it.  Mild acute on chronic diastolic CHF exacerbation - Patient was placed on IV Lasix for diuresis and had good diuresis with 8.1L negative balance at the time of discharge.  - chest x-ray had shown cardiomegaly with vascular congestion - will continue Lasix 40 mg IV daily, creatinine stable  - weight down from 249lbs-> 230lbs     Respiratory bronchiolitis interstitial lung disease ([place]) - Followed by [facility]  - Presented with significant respiratory symptoms  in 2005, underwent extensive w/u including inconclusive transbronchial bx and eventual VATS for wedge-biopsy in Jan 2016 with diagnosis of severe RB-ILD  - [place] of tx is smoking-cessation; pt has cut-back dramatically, but has been difficult to quit - Nicotine patch provided  - Continue supplemental O2, wean as able  - Outpatient pulmonology follow-up scheduled    Schizophrenia ([place]) - Currently stable, denies any active SI, HI, or hallucinations  - Continue current management Seroquel, Abilify, Wellbutrin - Trazodone discontinued   Day of Discharge BP 135/77 (BP Location: Left Arm)   Pulse 88   Temp 98.8 F (37.1 C) (Oral)   Resp 18   Ht _0  (1.626 m)   Wt 108.7 kg (239 lb 11.2 oz)   LMP 06/24/2016 (Approximate)   SpO2 94%   BMI 41.14 kg/m   Physical Exam: General: Alert and awake oriented x3 not in any acute distress. HEENT: anicteric sclera, pupils reactive to light and accommodation CVS: S1-S2 clear no murmur rubs or gallops Chest:  clear to auscultation bilaterally, no wheezing rales or rhonchi Abdomen: Obese, soft, nontender, nondistended, normal bowel sounds Extremities: no cyanosis, clubbing or edema noted bilaterally Neuro: Cranial nerves II-XII intact, no focal neurological deficits   The results of significant diagnostics from this hospitalization (including imaging, microbiology, ancillary and laboratory) are listed below for reference.    LAB RESULTS: Basic Metabolic Panel:  Recent Labs Lab 07/04/16 1758 07/06/16 0955 07/07/16 0330  NA  --  138 140  K  --  3.8 3.9  CL  --  98* 98*  CO2  --  34* 32  GLUCOSE  --  191* 88  BUN  --  11 11  CREATININE 0.85 1.02* 0.85  CALCIUM  --  9.3 9.8  MG 2.0  --   --    Liver Function Tests: No results for input(s): AST, ALT, ALKPHOS, BILITOT, PROT, ALBUMIN in the last 168 hours. No results for input(s): LIPASE, AMYLASE in the last 168 hours. No results for input(s): AMMONIA in the  last 168 hours. CBC:  Recent Labs Lab 07/04/16 1758 07/07/16 0330  WBC 7.3 14.1*  HGB 14.7 15.1*  HCT 48.8* 51.8*  MCV 79.2 79.3  PLT 268 265   Cardiac Enzymes:  Recent Labs Lab 07/04/16 2301 07/05/16 0651  TROPONINI <0.03 <0.03   BNP: Invalid input(s): POCBNP CBG: No results for input(s): GLUCAP in the last 168 hours.  Significant Diagnostic Studies:  Dg Chest Portable 1 View  Result Date: 07/04/2016 CLINICAL DATA:  Shortness of breath, chest pain, smoker EXAM: PORTABLE CHEST 1 VIEW COMPARISON:  03/29/2016 FINDINGS: Stable cardiomegaly and central vascular congestion. Limited exam because of portable technique and body habitus. No definite superimposed pneumonia, collapse or consolidation. Negative for edema, large effusion or pneumothorax. Trachea is midline. IMPRESSION: Cardiomegaly with vascular congestion.  Stable exam. Electronically Signed   By: Jerilynn Mages.  Shick M.D.   On: 07/04/2016 15:02    2D ECHO:   Disposition and Follow-up: Discharge Instructions     ([place]) Call MD:  Anytime you have any of the following symptoms: 1) 3 pound weight gain in 24 hours or 5 pounds in 1 week 2) shortness of breath, with or without a dry hacking cough 3) swelling in the hands, feet or stomach 4) if you have to sleep on extra pillows at night in order to breathe.    Complete by:  As directed    Diet - low sodium heart healthy    Complete by:  As directed    Increase activity slowly    Complete by:  As directed        DISPOSITION: Home   DISCHARGE FOLLOW-UP Follow-up Information    Philis Fendt, MD. Go on 07/14/2016.   Specialty:  Internal Medicine Why:  _0 :30am Contact information: [place] [address] [phone]        Simonne Maffucci, MD. Go on 07/21/2016.   Specialty:  Pulmonary Disease Why:  _1 :30am Contact information: [place] [address] [phone]            Time spent on Discharge: 60mns   Signed:   RAI,RIPUDEEP M.D. [facility] 07/07/2016, 1:13 PM Pager: [phone]

## 2016-07-07 NOTE — Progress Notes (Signed)
Pt has orders to be discharged. Discharge instructions given and pt has no additional questions at this time. Medication regimen reviewed and pt educated. Pt verbalized understanding and has no additional questions. Telemetry box removed. IV removed and site in good condition. Pt stable and waiting for transportation.  Diannia Hogenson RN 

## 2016-07-07 NOTE — Evaluation (Signed)
Physical Therapy Evaluation Patient Details Name: Diane Gilbert MRN: [id number] DOB: April 12, 1966 Today's Date: 07/07/2016   History of Present Illness  51 year old female with schizophrenia, hypertension, tobacco abuse, chronic interstitial lung disease on 2 L O2 at baseline, chronic respiratory failure presented to ED with dyspnea and hypoxia. O2 sats were 70s on arrival to ER on 2 L  Clinical Impression  Patient seen for mobility assessment. Independent at this time. Ambulated on room air with saturations dropping to 77%, improved to 89% on 2 liters and 91% on 3 liters. Will need continued O2 upon acute discharge. No further acute PT needs, will sign off.    Follow Up Recommendations No PT follow up    Equipment Recommendations  None recommended by PT    Recommendations for Other Services       Precautions / Restrictions Precautions Precaution Comments: watch o2 saturations Restrictions Weight Bearing Restrictions: No      Mobility  Bed Mobility Overal bed mobility: Independent                Transfers Overall transfer level: Independent                  Ambulation/Gait Ambulation/Gait assistance: Independent Ambulation Distance (Feet): 230 Feet Assistive device: None Gait Pattern/deviations: WFL(Within Functional Limits) Gait velocity: decreased   General Gait Details: ambulated for O2 saturations, on 2 liters dropped to 89%, improved on 3 liters to 91%, on room air dropped to 77%.  Stairs            Wheelchair Mobility    Modified Rankin (Stroke Patients Only)       Balance                                             Pertinent Vitals/Pain Pain Assessment: No/denies pain    Home Living Family/patient expects to be discharged to:: Private residence Living Arrangements: Alone   Type of Home: Apartment Home Access: Stairs to enter Entrance Stairs-Rails: Can reach both Entrance Stairs-Number of Steps: 1 Home  Layout: One level Home Equipment: None      Prior Function Level of Independence: Independent         Comments: O2 dependent     Hand Dominance   Dominant Hand: Right    Extremity/Trunk Assessment   Upper Extremity Assessment Upper Extremity Assessment: Overall WFL for tasks assessed    Lower Extremity Assessment Lower Extremity Assessment: Overall WFL for tasks assessed       Communication   Communication: No difficulties  Cognition Arousal/Alertness: Awake/alert Behavior During Therapy: Flat affect Overall Cognitive Status: Within Functional Limits for tasks assessed                      General Comments      Exercises     Assessment/Plan    PT Assessment Patent does not need any further PT services  PT Problem List            PT Treatment Interventions      PT Goals (Current goals can be found in the Care Plan section)  Acute Rehab PT Goals PT Goal Formulation: All assessment and education complete, DC therapy    Frequency     Barriers to discharge        Co-evaluation  End of Session Equipment Utilized During Treatment: Oxygen Activity Tolerance: Patient tolerated treatment well Patient left: in bed;with call bell/phone within reach Nurse Communication: Mobility status         Time: 1013-1029 PT Time Calculation (min) (ACUTE ONLY): 16 min   Charges:   PT Evaluation $PT Eval Low Complexity: 1 Procedure     PT G Codes:        Duncan Dull July 22, 2016, 10:30 AM Alben Deeds, PT DPT [phone]

## 2016-07-09 LAB — CULTURE, BLOOD (ROUTINE X 2)
CULTURE: NO GROWTH
Culture: NO GROWTH

## 2016-07-14 ENCOUNTER — Ambulatory Visit: Payer: Medicaid Other | Admitting: Pulmonary Disease

## 2016-07-15 ENCOUNTER — Ambulatory Visit: Payer: Medicaid Other | Admitting: Pulmonary Disease

## 2016-07-21 ENCOUNTER — Encounter: Payer: Self-pay | Admitting: Adult Health

## 2016-07-21 ENCOUNTER — Ambulatory Visit (INDEPENDENT_AMBULATORY_CARE_PROVIDER_SITE_OTHER): Payer: Medicaid Other | Admitting: Adult Health

## 2016-07-21 DIAGNOSIS — G4733 Obstructive sleep apnea (adult) (pediatric): Secondary | ICD-10-CM | POA: Diagnosis not present

## 2016-07-21 DIAGNOSIS — J84115 Respiratory bronchiolitis interstitial lung disease: Secondary | ICD-10-CM

## 2016-07-21 DIAGNOSIS — J441 Chronic obstructive pulmonary disease with (acute) exacerbation: Secondary | ICD-10-CM | POA: Diagnosis not present

## 2016-07-21 MED ORDER — BUDESONIDE-FORMOTEROL FUMARATE 160-4.5 MCG/ACT IN AERO: 2.0000 | INHALATION_SPRAY | Freq: Two times a day (BID) | RESPIRATORY_TRACT | 6 refills | Status: DC

## 2016-07-21 NOTE — Progress Notes (Signed)
_0  ID: Diane Gilbert, female    DOB: 03-29-1966, 51 y.o.   MRN: [id number]  Chief Complaint  Patient presents with  . 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   TEST  HRCT 12/2013:  Mild GG, centrilobular nodularity PFTs 2015:  FEV1 1.48 (56%), but normal ratio.  No BD response.  TLC 60%, mild airtrapping.  DLCO 45%, but corrects to normal with Av Bronch 03/2014:  BAL with 85 WBC's, 21% eos.  TBBX with normal lung parenchyma, but not enough for possible specific diagnosis HP panel negative 03/2014 Pred trial 04/16/2014 >> unclear if any better.  VATS BX 06/2014:  RB-ILD (severe), ??? Component NSIP included in the differential [facility] labs normal 07/2014 March 2017 6 minute walk distance 144 m, O2 saturation nadir 81% on room air March 2017 pulmonary function testing ratio 85%, FVC 1.61 L (50% predicted) sees, FEV1 1.37 L (53% predicted), total lung capacity 3.14 L (58% predicted), DLCO 13.3 (49% predicted).  07/21/2016 Follow up : hospital follow up /ILD  Pt returns for 2 week post hospital follow up . She was recently admitted for COPD exacerbation , tx w/ abx and steroids. Discharged on ceftin and prednisone taper .Felt to have a component of decompensated diastolic CHF, improved with diuresis .  She is starting to feel better. Breathing is getting back to baseline. Still has come dry cough .  Still smoking , discussed cessation .  She is out of her Symbicort and refill was sent to pharm.   She was recommended to have sleep study , felt she might have compoenent of OSA /OHS. She had a Previous sleep study in 2015 showed mild OSA with AHI at 10 . She was recommended for CPAP titration study . We discussed these results and recommended a CPAP study however she declines. Said she tried the CPAP before and absolutely will not wear it.     No Known  Allergies  Immunization History  Administered Date(s) Administered  . Influenza,inj,Quad PF,36+ Mos 03/12/2013, 06/07/2014, 05/13/2015    Past Medical History:  Diagnosis Date  . Alcohol abuse   . Anxiety   . Arthritis   . Depression   . Headache(784.0)   . Hypertension   . Insomnia   . Interstitial lung disease ([place])   . Oxygen dependent   . Schizophrenia ([place])   . Shortness of breath dyspnea   . Sleep apnea     Tobacco History: History  Smoking Status  . Current Some Day Smoker  . Packs/day: 1.50  . Years: 30.00  . Types: Cigarettes  Smokeless Tobacco  . Never Used    Comment: smoking 3 cigarettes daily "every now and then" 01/16/16   Ready to quit: No Counseling given: Yes   Outpatient Encounter Prescriptions as of 07/21/2016  Medication Sig  . ARIPiprazole (ABILIFY) 20 MG tablet Take 20 mg by mouth daily.  Marland Kitchen buPROPion (WELLBUTRIN SR) 150 MG 12 hr tablet Take 150 mg by mouth See admin instructions. Take 1 tablet (150 mg) by mouth every morning and at noon  . furosemide (LASIX) 40 MG tablet Take 1 tablet (40 mg total) by mouth daily.  Marland Kitchen guaiFENesin (MUCINEX) 600 MG 12 hr tablet Take 1 tablet (600 mg total) by mouth 2 (two) times daily.  . OXYGEN Inhale 2 L into the lungs continuous.  Marland Kitchen QUEtiapine (SEROQUEL) 300 MG  tablet Take 300 mg by mouth at bedtime.  . traZODone (DESYREL) 150 MG tablet Take 150 mg by mouth at bedtime.  Marland Kitchen albuterol (PROVENTIL HFA;VENTOLIN HFA) 108 (90 BASE) MCG/ACT inhaler Inhale 2 puffs into the lungs every 6 (six) hours as needed for wheezing or shortness of breath. (Patient not taking: Reported on 07/04/2016)  . budesonide-formoterol (SYMBICORT) 160-4.5 MCG/ACT inhaler Inhale 2 puffs into the lungs 2 (two) times daily.  . [DISCONTINUED] budesonide-formoterol (SYMBICORT) 160-4.5 MCG/ACT inhaler Inhale 2 puffs into the lungs 2 (two) times daily. (Patient not taking: Reported on 07/21/2016)  . [DISCONTINUED] cefUROXime (CEFTIN) 500 MG tablet Take 1  tablet (500 mg total) by mouth 2 (two) times daily with a meal. X 5days  . [DISCONTINUED] predniSONE (DELTASONE) 10 MG tablet Prednisone dosing: Take  Prednisone 51m (4 tabs) x 3 days, then taper to 351m(3 tabs) x 3 days, then 2038m2 tabs) x 3days, then 28m15m tab) x 3days, then OFF.  Dispense:  30 tabs, refills: None   No facility-administered encounter medications on file as of 07/21/2016.      Review of Systems  Constitutional:   No  weight loss, night sweats,  Fevers, chills, fatigue, or  lassitude.  HEENT:   No headaches,  Difficulty swallowing,  Tooth/dental problems, or  Sore throat,                No sneezing, itching, ear ache,  +nasal congestion, post nasal drip,   CV:  No chest pain,  Orthopnea, PND, swelling in lower extremities, anasarca, dizziness, palpitations, syncope.   GI  No heartburn, indigestion, abdominal pain, nausea, vomiting, diarrhea, change in bowel habits, loss of appetite, bloody stools.   Resp: .  No chest wall deformity  Skin: no rash or lesions.  GU: no dysuria, change in color of urine, no urgency or frequency.  No flank pain, no hematuria   MS:  No joint pain or swelling.  No decreased range of motion.  No back pain.    Physical Exam  BP 122/80   Pulse 90   Temp 97.9 F (36.6 C) (Oral)   Ht _0  (1.626 m)   Wt 248 lb 12.8 oz (112.9 kg)   LMP 06/24/2016 (Approximate)   SpO2 91%   BMI 42.71 kg/m   GEN: A/Ox3; pleasant , NAD, chronically ill appearing on O2    HEENT:  [place]/AT,  EACs-clear, TMs-wnl, NOSE-clear, THROAT-clear, no lesions, no postnasal drip or exudate noted.   NECK:  Supple w/ fair ROM; no JVD; normal carotid impulses w/o bruits; no thyromegaly or nodules palpated; no lymphadenopathy.    RESP  Clear  P & A; w/o, wheezes/ rales/ or rhonchi. no accessory muscle use, no dullness to percussion  CARD:  RRR, no m/r/g, tr  peripheral edema, pulses intact, no cyanosis or clubbing.  GI:   Soft & nt; nml bowel sounds; no  organomegaly or masses detected.   Musco: Warm bil, no deformities or joint swelling noted.   Neuro: alert, no focal deficits noted.    Skin: Warm, no lesions or rashes  Psych:  No change in mood or affect. No depression or anxiety.  No memory loss.   Imaging: Dg Chest Portable 1 View  Result Date: 07/04/2016 CLINICAL DATA:  Shortness of breath, chest pain, smoker EXAM: PORTABLE CHEST 1 VIEW COMPARISON:  03/29/2016 FINDINGS: Stable cardiomegaly and central vascular congestion. Limited exam because of portable technique and body habitus. No definite superimposed pneumonia, collapse or consolidation. Negative for  edema, large effusion or pneumothorax. Trachea is midline. IMPRESSION: Cardiomegaly with vascular congestion.  Stable exam. Electronically Signed   By: Jerilynn Mages.  Shick M.D.   On: 07/04/2016 15:02     Assessment & Plan:   COPD with acute exacerbation ([place]) Recent flare now improving   Plan  Patient Instructions  Restart Symbicort 2 puffs Twice daily  ,  Continue to work on not smoking .  Continue on Oxygen 2l/m .  Follow up with Dr. 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      Rexene Edison, NP 07/21/2016

## 2016-07-21 NOTE — Patient Instructions (Addendum)
Restart Symbicort 2 puffs Twice daily  ,  Continue to work on not smoking .  Continue on Oxygen 2l/m .  Follow up with Dr. Lake Bells in 6-8 weeks and As needed    Please contact office for sooner follow up if symptoms do not improve or worsen or seek emergency care

## 2016-07-21 NOTE — Assessment & Plan Note (Signed)
Smoking cessatioin

## 2016-07-21 NOTE — Assessment & Plan Note (Signed)
Recent flare now improving   Plan  Patient Instructions  Restart Symbicort 2 puffs Twice daily  ,  Continue to work on not smoking .  Continue on Oxygen 2l/m .  Follow up with Dr. Lake Bells in 6-8 weeks and As needed    Please contact office for sooner follow up if symptoms do not improve or worsen or seek emergency care

## 2016-07-21 NOTE — Assessment & Plan Note (Signed)
Mild OSA rec CPAP titration  Pt refuses Cont on o2.

## 2016-07-26 NOTE — Progress Notes (Signed)
Reviewed, agree 

## 2016-08-10 ENCOUNTER — Telehealth: Payer: Self-pay

## 2016-08-10 NOTE — Telephone Encounter (Signed)
PA request received for Symbicort. Called [employer] to initiate PA at [phone] PA approved through 08/05/2017 PA# [id number] Pharmacy aware of approval.  Nothing further needed.

## 2016-09-01 ENCOUNTER — Encounter: Payer: Self-pay | Admitting: Pulmonary Disease

## 2016-09-01 ENCOUNTER — Ambulatory Visit (INDEPENDENT_AMBULATORY_CARE_PROVIDER_SITE_OTHER)
Admission: RE | Admit: 2016-09-01 | Discharge: 2016-09-01 | Disposition: A | Discharge status: Home / Self Care | Payer: Medicaid Other | Source: Ambulatory Visit | Attending: Pulmonary Disease | Admitting: Pulmonary Disease

## 2016-09-01 ENCOUNTER — Ambulatory Visit (INDEPENDENT_AMBULATORY_CARE_PROVIDER_SITE_OTHER): Payer: Medicaid Other | Admitting: Pulmonary Disease

## 2016-09-01 VITALS — BP 132/68 | HR 92 | Ht 64.0 in | Wt 243.0 lb

## 2016-09-01 DIAGNOSIS — J9621 Acute and chronic respiratory failure with hypoxia: Secondary | ICD-10-CM

## 2016-09-01 DIAGNOSIS — F17200 Nicotine dependence, unspecified, uncomplicated: Secondary | ICD-10-CM

## 2016-09-01 DIAGNOSIS — J84115 Respiratory bronchiolitis interstitial lung disease: Secondary | ICD-10-CM | POA: Diagnosis not present

## 2016-09-01 DIAGNOSIS — R0600 Dyspnea, unspecified: Secondary | ICD-10-CM

## 2016-09-01 MED ORDER — BUDESONIDE-FORMOTEROL FUMARATE 160-4.5 MCG/ACT IN AERO: 2.0000 | INHALATION_SPRAY | Freq: Two times a day (BID) | RESPIRATORY_TRACT | 0 refills | Status: DC

## 2016-09-01 NOTE — Progress Notes (Signed)
Subjective:    Patient ID: Diane Gilbert, female    DOB: [birth year], 51 y.o.   MRN: [id number]  Former patient of Dr. Gwenette Gilbert with respiratory bronchiolitis interstitial lung disease Dr. Gwenette Gilbert summarized her clinical situation as follows: HRCT 12/2013:  Mild GG, centrilobular nodularity PFTs 2015:  FEV1 1.48 (56%), but normal ratio.  No BD response.  TLC 60%, mild airtrapping.  DLCO 45%, but corrects to normal with Av Bronch 03/2014:  BAL with 85 WBC's, 21% eos.  TBBX with normal lung parenchyma, but not enough for possible specific diagnosis HP panel negative 03/2014 Pred trial 04/16/2014 >> unclear if any better.  VATS BX 06/2014:  RB-ILD (severe), ??? Component NSIP included in the differential Autoimmune labs normal 07/2014 March 2017 6 minute walk distance 144 m, O2 saturation nadir 81% on room air March 2017 pulmonary function testing ratio 85%, FVC 1.61 L (50% predicted) sees, FEV1 1.37 L (53% predicted), total lung capacity 3.14 L (58% predicted), DLCO 13.3 (49% predicted).  HPI Chief Complaint  Patient presents with  . Follow-up    pt c/o worsening DOE.  denies cough, mucus production.    Diane Gilbert was hospitalized back in January for a CHF and "COPD" exacerbatoin.  She still has some dyspnea, rare cough.  She had wheezing, but none now . She is using albuterol which helps.  Symbicort was prescribed, but she hasn't filled the presciption.  She uses albuterol only rarely.    She think sthat she feels a little better overall.    She is still smoking, she is smoking 2 cigarettes a day.  She has a cough, no mucus production.    No chest pain or leg swelling.    She refuses CPAP.  She is using and benefitting from the oxygen only when she walks around or if she feels dyspnea.    Past Medical History:  Diagnosis Date  . Alcohol abuse   . Anxiety   . Arthritis   . Depression   . Headache(784.0)   . Hypertension   . Insomnia   . Interstitial lung disease ([place])   .  Oxygen dependent   . Schizophrenia ([place])   . Shortness of breath dyspnea   . Sleep apnea       Review of Systems  Constitutional: Negative for chills, fatigue and fever.  HENT: Negative for hearing loss, postnasal drip and rhinorrhea.   Respiratory: Positive for cough and shortness of breath. Negative for wheezing.   Cardiovascular: Negative for chest pain, palpitations and leg swelling.       Objective:   Physical Exam Vitals:   09/01/16 1404  BP: 132/68  Pulse: 92  SpO2: 98%  Weight: 243 lb (110.2 kg)  Height: _0  (1.626 m)   2 L [place]  Gen: chronically ill appearing HENT: OP clear, TM's clear, neck supple PULM: CTA B, normal percussion CV: RRR, no mgr, trace edema GI: BS+, soft, nontender Derm: no cyanosis or rash Psyche: normal mood and affect  Hospital records reviewed from January 2018 where she was hospitalized for a COPD exacerbation and CHF exacerbation, diuresed, and treated with home oxygen therapy  Last PFTs reviewed  CBC    Component Value Date/Time   WBC 14.1 (H) 07/07/2016 0330   RBC 6.53 (H) 07/07/2016 0330   HGB 15.1 (H) 07/07/2016 0330   HCT 51.8 (H) 07/07/2016 0330   PLT 265 07/07/2016 0330   MCV 79.3 07/07/2016 0330   MCH 23.1 (L) 07/07/2016 0330   MCHC  29.2 (L) 07/07/2016 0330   RDW 18.5 (H) 07/07/2016 0330   LYMPHSABS 3.3 03/24/2016 1539   MONOABS 0.6 03/24/2016 1539   EOSABS 0.2 03/24/2016 1539   BASOSABS 0.1 03/24/2016 1539          Assessment & Plan:  Smoking addiction Counseled at length today on the importance of quitting somking and she was given information regarding community resources for smoking cessation.  Respiratory bronchiolitis interstitial lung disease ([place]) I explained to her today that she has a case of lung disease that is directly related to her smoking and I explained again that if she could stop smoking she could see improvement in her symptoms.  However, we have not gotten objective testing on her in a  while.  Plan: Lung function testing, 6 minute walk and follow-up with me in 6 weeks  Acute on chronic respiratory failure with hypoxia ([place]) For now she will continue using oxygen with exertion but we'll schedule a 6 minute walk to assess whether or not she still needs this.  >50% of this 26 minute visit face to face  Current Outpatient Prescriptions:  .  albuterol (PROVENTIL HFA;VENTOLIN HFA) 108 (90 BASE) MCG/ACT inhaler, Inhale 2 puffs into the lungs every 6 (six) hours as needed for wheezing or shortness of breath., Disp: 1 Inhaler, Rfl: 3 .  albuterol (PROVENTIL) (2.5 MG/3ML) 0.083% nebulizer solution, Take 2.5 mg by nebulization every 6 (six) hours as needed for wheezing or shortness of breath., Disp: , Rfl:  .  ARIPiprazole (ABILIFY) 20 MG tablet, Take 20 mg by mouth daily., Disp: , Rfl:  .  budesonide-formoterol (SYMBICORT) 160-4.5 MCG/ACT inhaler, Inhale 2 puffs into the lungs 2 (two) times daily., Disp: 2 Inhaler, Rfl: 0 .  buPROPion (WELLBUTRIN SR) 150 MG 12 hr tablet, Take 150 mg by mouth See admin instructions. Take 1 tablet (150 mg) by mouth every morning and at noon, Disp: , Rfl:  .  furosemide (LASIX) 40 MG tablet, Take 1 tablet (40 mg total) by mouth daily., Disp: 30 tablet, Rfl: 3 .  guaiFENesin (MUCINEX) 600 MG 12 hr tablet, Take 1 tablet (600 mg total) by mouth 2 (two) times daily., Disp: 30 tablet, Rfl: 0 .  OXYGEN, Inhale 2 L into the lungs continuous., Disp: , Rfl:  .  QUEtiapine (SEROQUEL) 300 MG tablet, Take 300 mg by mouth at bedtime., Disp: , Rfl:  .  traZODone (DESYREL) 150 MG tablet, Take 150 mg by mouth at bedtime., Disp: , Rfl:

## 2016-09-01 NOTE — Assessment & Plan Note (Signed)
For now she will continue using oxygen with exertion but we'll schedule a 6 minute walk to assess whether or not she still needs this.

## 2016-09-01 NOTE — Patient Instructions (Signed)
For your tobacco use: Stop smoking, please review the information we gave you regarding community resources for smoking cessation assistance  For your interstitial lung disease: You have a special case which is directly related to your tobacco use. If you could stop smoking her symptoms will dramatically improve We will arrange for a lung function test Take Symbicort 2 puffs twice a day  For your need for oxygen: Keep using oxygen when you exert yourself for now, however we are going to arrange for a 6 minute walk test where we measured your oxygen here in the clinic  We will see you back in 6-8 weeks or sooner

## 2016-09-01 NOTE — Assessment & Plan Note (Signed)
Counseled at length today on the importance of quitting somking and she was given information regarding community resources for smoking cessation.

## 2016-09-01 NOTE — Assessment & Plan Note (Signed)
I explained to her today that she has a case of lung disease that is directly related to her smoking and I explained again that if she could stop smoking she could see improvement in her symptoms.  However, we have not gotten objective testing on her in a while.  Plan: Lung function testing, 6 minute walk and follow-up with me in 6 weeks

## 2016-10-23 ENCOUNTER — Other Ambulatory Visit: Payer: Self-pay | Admitting: Internal Medicine

## 2016-10-23 DIAGNOSIS — Z1231 Encounter for screening mammogram for malignant neoplasm of breast: Secondary | ICD-10-CM

## 2016-11-04 ENCOUNTER — Ambulatory Visit (INDEPENDENT_AMBULATORY_CARE_PROVIDER_SITE_OTHER): Payer: Medicaid Other | Admitting: Pulmonary Disease

## 2016-11-04 ENCOUNTER — Ambulatory Visit (INDEPENDENT_AMBULATORY_CARE_PROVIDER_SITE_OTHER): Payer: Medicaid Other | Admitting: *Deleted

## 2016-11-04 ENCOUNTER — Encounter: Payer: Self-pay | Admitting: Pulmonary Disease

## 2016-11-04 VITALS — BP 146/84 | HR 81 | Ht 64.0 in | Wt 254.0 lb

## 2016-11-04 DIAGNOSIS — J9621 Acute and chronic respiratory failure with hypoxia: Secondary | ICD-10-CM

## 2016-11-04 DIAGNOSIS — R0600 Dyspnea, unspecified: Secondary | ICD-10-CM

## 2016-11-04 DIAGNOSIS — R0602 Shortness of breath: Secondary | ICD-10-CM

## 2016-11-04 MED ORDER — FUROSEMIDE 40 MG PO TABS: 40.0000 mg | ORAL_TABLET | Freq: Every day | ORAL | 0 refills | Status: DC

## 2016-11-04 NOTE — Progress Notes (Signed)
SIX MIN WALK 11/04/2016 09/06/2015 05/09/2015  Medications pt is not sure what all medications she has taken today--states that they are all in a bubble pack together. Norvasc 81m, Abilify 573m Wellbutrin 15084mDoxycycline 100m48miflucan 100mg95mbapentin 100mg,2mcocet 5-325mg, 13mflex 4mg - a24mmeds taken at 0730 -  Supplimental Oxygen during Test? (L/min) Yes Yes Yes  O2 Flow Rate _0 Type Continuous Pulse Continuous  Laps 3 3 -  Partial Lap (in Meters) 0 0 -  Baseline BP (sitting) 110/70 138/80 -  Baseline Heartrate 83 91 -  Baseline Dyspnea (Borg Scale) 4 4 -  Baseline Fatigue (Borg Scale) 7 7 -  Baseline SPO2 94 91 -  BP (sitting) 122/80 152/82 -  Heartrate 96 111 -  Dyspnea (Borg Scale) 4 5 -  Fatigue (Borg Scale) 8 8 -  SPO2 96 81 -  BP (sitting) 122/78 140/80 -  Heartrate 84 94 -  SPO2 98 89 -  Stopped or Paused before Six Minutes Yes Yes -  Other Symptoms at end of Exercise Pt paused from 4:04-2:55 to adjust O2 from desat on room air --pt was placed on 2 liters of O2 when desated to 87% ; pt paused again from 1:23-0:31 per pt request to rest d/t leg pain/fatigue.  Pt did not want to continue walk d/t dyspnea and dizziness. Pt states she started feeling the dizziness after the first lap.  -  Interpretation - Dizziness -  Distance Completed 144 144 -  Tech Comments: pt O2 titrated using continuous flow as the patient's tank was almost empty and she did not bring a spare. Pt paused from 4:04-2:55 to adjust O2 from desat on room air --pt was placed on 2 liters of O2 when desated to 87%, pt desated on the second lap at 72meters5mt paused again from 1:23-0:31 per pt request to rest d/t leg pain/fatigue.  Sats remained in the 90s with 2 liters O2 during the 6min titr46mon.  --amg Pt stopped at 3 full laps at min 3:04 Completed 1/2 lap. Stopped due to sat. decreased to 82%,dizzy. Returned to room,sat. increased to 90% after resting at least 15 secs.

## 2016-11-04 NOTE — Progress Notes (Signed)
[facility] attending:  I have seen and examined the patient with Dr. Heber [place] and I agree with the findings from her note  Diane Gilbert describes some increasing fatigue. She has cut down her cigarettes to 2-3 a day. She has some leg swelling. She says that she takes her medications as they're prescribed to her in pre-packaged daily aliquots. She continues to use and benefit from her oxygen.  On exam: Vitals:   11/04/16 1552  BP: (!) 146/84  Pulse: 81  SpO2: 91%  Weight: 254 lb (115.2 kg)  Height: _0  (1.626 m)   2L [place]  Gen: chronically ill appearing HENT: OP clear, TM's clear, neck supple PULM: Crackles bases B, normal percussion CV: RRR, no mgr, 1-2+ pitting edema GI: BS+, soft, nontender Derm: no cyanosis or rash Psyche: normal mood and affect  BMET    Component Value Date/Time   NA 140 07/07/2016 0330   K 3.9 07/07/2016 0330   CL 98 (L) 07/07/2016 0330   CO2 32 07/07/2016 0330   GLUCOSE 88 07/07/2016 0330   BUN 11 07/07/2016 0330   CREATININE 0.85 07/07/2016 0330   CALCIUM 9.8 07/07/2016 0330   GFRNONAA >60 07/07/2016 0330   GFRAA >60 07/07/2016 0330    Chest imaging: HRCT 12/2013:  Mild GG, centrilobular nodularity  PFT 2015:  FEV1 1.48 (56%), but normal ratio.  No BD response.  TLC 60%, mild airtrapping.  DLCO 45%, but corrects to normal with Av March 2017 pulmonary function testing ratio 85%, FVC 1.61 L (50% predicted) sees, FEV1 1.37 L (53% predicted), total lung capacity 3.14 L (58% predicted), DLCO 13.3 (49% predicted). May 2018 pulmonary function testing ratio 87%, FEV1 1.18 L 50% predicted, FVC 1.36 L 46% predicted, total lung capacity 3.02 L 59% predicted, DLCO 12.65 mL per Min per millimeters of mercury 52% predicted  Path Bronch 03/2014:  BAL with 85 WBC's, 21% eos.  TBBX with normal lung parenchyma, but not enough for possible specific diagnosis VATS BX 06/2014:  RB-ILD (severe), ??? Component NSIP included in the  differential  Labs: HP panel negative 03/2014 Autoimmune labs normal 07/2014  6MW March 2017 6 minute walk distance 144 m, O2 saturation nadir 81% on room air April 2018 6 minute walk distance 144 m, O2 saturation nadir 87% on room air  Impression/plan: RB-ILD: Minimal objective evidence of worsening, but clearly with ongoing tobacco use this will continue to progress. She has done a good job of cutting back on smoking and I congratulated her for this. Chronic respiratory failure with hypoxemia: Continue 3 L pulse with exertion, will prescribe portable oxygen concentrator Cigarette use: Encouraged on  Cutting back, encouraged to quit altogether Diastolic heart failure: Encouraged her to measure her weight every day, as she is volume up will add an extra dose of Lasix for the next 3 days  > 50% of this 50 minute visit spent face to face  Roselie Awkward, MD [facility] Pager: [phone] Cell: [phone] After 3pm or if no response, call [phone]

## 2016-11-04 NOTE — Progress Notes (Signed)
Subjective:    Patient ID: Diane Gilbert, female    DOB: [birth year], 51 y.o.   MRN: [id number]  Former patient of Dr. Gwenette Greet with respiratory bronchiolitis interstitial lung disease Dr. Gwenette Greet summarized her clinical situation as follows: HRCT 12/2013:  Mild GG, centrilobular nodularity PFTs 2015:  FEV1 1.48 (56%), but normal ratio.  No BD response.  TLC 60%, mild airtrapping.  DLCO 45%, but corrects to normal with Av Bronch 03/2014:  BAL with 85 WBC's, 21% eos.  TBBX with normal lung parenchyma, but not enough for possible specific diagnosis HP panel negative 03/2014 Pred trial 04/16/2014 >> unclear if any better.  VATS BX 06/2014:  RB-ILD (severe), ??? Component NSIP included in the differential Autoimmune labs normal 07/2014 March 2017 6 minute walk distance 144 m, O2 saturation nadir 81% on room air March 2017 pulmonary function testing ratio 85%, FVC 1.61 L (50% predicted) sees, FEV1 1.37 L (53% predicted), total lung capacity 3.14 L (58% predicted), DLCO 13.3 (49% predicted).  HPI Chief Complaint  Patient presents with  . Follow-up    review 63m and pft.  pt c/o worsening sob with exertion, chest tightness with exertion, swelling worse on L side of body.     Patient complains of being more tired than usual in the past 6 weeks.  She is on 2L continous oxygen.  Patient would like a portable travel oxygen tank.  She denies cough or chest pain. Reports left leg swelling and wheezing only when walking long distances.  She uses symbicort 2 puffs in the morning.  Reports smoking occasionally, about one pack a week.  She has been trying to quit using nicotine patches.   Past Medical History:  Diagnosis Date  . Alcohol abuse   . Anxiety   . Arthritis   . Depression   . Headache(784.0)   . Hypertension   . Insomnia   . [place] lung disease ([place]   . Oxygen dependent   . Schizophrenia ([place]   . Shortness of breath dyspnea   . Sleep apnea       Review of Systems    Constitutional: Positive for fatigue. Negative for chills and fever.  HENT: Negative for postnasal drip and rhinorrhea.   Respiratory: Positive for shortness of breath and wheezing. Negative for cough.   Cardiovascular: Positive for leg swelling. Negative for chest pain and palpitations.       Objective:   Physical Exam Vitals:   11/04/16 1552  BP: (!) 146/84  Pulse: 81  SpO2: 91%  Weight: 254 lb (115.2 kg)  Height: _0  (1.626 m)    Gen: chronically ill appearing HENT: OP clear, TM's clear, neck supple PULM: bibasilar crackles CV: RRR, no mgr, 2+ pitting edema in lower extremity L>R GI: BS+, soft, nontender Derm: no cyanosis or rash Psyche: normal mood and affect   CBC    Component Value Date/Time   WBC 14.1 (H) 07/07/2016 0330   RBC 6.53 (H) 07/07/2016 0330   HGB 15.1 (H) 07/07/2016 0330   HCT 51.8 (H) 07/07/2016 0330   PLT 265 07/07/2016 0330   MCV 79.3 07/07/2016 0330   MCH 23.1 (L) 07/07/2016 0330   MCHC 29.2 (L) 07/07/2016 0330   RDW 18.5 (H) 07/07/2016 0330   LYMPHSABS 3.3 03/24/2016 1539   MONOABS 0.6 03/24/2016 1539   EOSABS 0.2 03/24/2016 1539   BASOSABS 0.1 03/24/2016 1539          Assessment & Plan:  No problem-specific Assessment & Plan notes  found for this encounter.  Assessment:  Respiratory bronchiolitis interstitial lung disease Patient's disease process is directly contributed by her smoking.  She reports cutting down smoking to 1-2 cigarettes a day and using nicotine patches. She was congratulated on her improvement and encouraged to continue cutting back.  During 6 min walk patient desated to 87% on room air for a distance of 1109f.  O2 remained in the 90s when placed on home 2L O2.  PFT today shows no worsening of her interstitial lung disease.  Plan -Continuous oxygen 2L - ordered portable oxygen concentrator  - follow up in 6 months  Assessment:  Diastolic Congestive Heart Failure Echo 03/2016 LV EF 65-70% with grade 2 diastolic  dysfunction.  Today patient had bibasilar crackles with pitting edema in lower extremities.  Patient reports compliance with her medications however is not able to tell what she takes since her medications are pre-packaged for daily use.  Will prescribe 3 extra doses of lasix to be picked up at her pharmacy  Plan - lasix 468mtwice daily for three days - encouraged patient to log daily weights   Current Outpatient Prescriptions:  .  albuterol (PROVENTIL HFA;VENTOLIN HFA) 108 (90 BASE) MCG/ACT inhaler, Inhale 2 puffs into the lungs every 6 (six) hours as needed for wheezing or shortness of breath., Disp: 1 Inhaler, Rfl: 3 .  albuterol (PROVENTIL) (2.5 MG/3ML) 0.083% nebulizer solution, Take 2.5 mg by nebulization every 6 (six) hours as needed for wheezing or shortness of breath., Disp: , Rfl:  .  ARIPiprazole (ABILIFY) 20 MG tablet, Take 20 mg by mouth daily., Disp: , Rfl:  .  budesonide-formoterol (SYMBICORT) 160-4.5 MCG/ACT inhaler, Inhale 2 puffs into the lungs 2 (two) times daily., Disp: 2 Inhaler, Rfl: 0 .  buPROPion (WELLBUTRIN SR) 150 MG 12 hr tablet, Take 150 mg by mouth See admin instructions. Take 1 tablet (150 mg) by mouth every morning and at noon, Disp: , Rfl:  .  furosemide (LASIX) 40 MG tablet, Take 1 tablet (40 mg total) by mouth daily., Disp: 30 tablet, Rfl: 3 .  guaiFENesin (MUCINEX) 600 MG 12 hr tablet, Take 1 tablet (600 mg total) by mouth 2 (two) times daily., Disp: 30 tablet, Rfl: 0 .  OXYGEN, Inhale 2 L into the lungs continuous., Disp: , Rfl:  .  QUEtiapine (SEROQUEL) 300 MG tablet, Take 300 mg by mouth at bedtime., Disp: , Rfl:  .  traZODone (DESYREL) 150 MG tablet, Take 150 mg by mouth at bedtime., Disp: , Rfl:

## 2016-11-04 NOTE — Progress Notes (Signed)
PFT done today. 

## 2016-11-04 NOTE — Patient Instructions (Addendum)
Diane Gilbert,  Congratulations on cutting back on smoking.  Continue the good work! Please take Lasix twice a day for the next 3 days. 3 Lasix pills have been sent to your pharmacy.  Please take one each day in addition to your pre-packaged medications. Please make a daily log of your weights and bring them to your primary care doctor A portable oxygen concentrator has been ordered for you  Please follow up in 6 months

## 2016-11-18 ENCOUNTER — Ambulatory Visit: Payer: Medicaid Other | Admitting: Registered"

## 2016-12-07 ENCOUNTER — Ambulatory Visit
Admission: RE | Admit: 2016-12-07 | Discharge: 2016-12-07 | Disposition: A | Discharge status: Home / Self Care | Payer: Medicaid Other | Source: Ambulatory Visit | Attending: Internal Medicine | Admitting: Internal Medicine

## 2016-12-07 DIAGNOSIS — Z1231 Encounter for screening mammogram for malignant neoplasm of breast: Secondary | ICD-10-CM

## 2016-12-08 ENCOUNTER — Ambulatory Visit: Payer: Medicaid Other | Admitting: Dietician

## 2016-12-11 ENCOUNTER — Ambulatory Visit: Payer: Medicaid Other | Admitting: Registered"

## 2016-12-15 LAB — PULMONARY FUNCTION TEST
DL/VA % PRED: 101 %
DL/VA: 4.86 ml/min/mmHg/L
DLCO COR: 12.09 ml/min/mmHg
DLCO cor % pred: 49 %
DLCO unc % pred: 52 %
DLCO unc: 12.65 ml/min/mmHg
FEF 25-75 Post: 1.09 L/sec
FEF 25-75 Pre: 1.34 L/sec
FEF2575-%CHANGE-POST: -18 %
FEF2575-%Pred-Post: 43 %
FEF2575-%Pred-Pre: 53 %
FEV1-%CHANGE-POST: -5 %
FEV1-%PRED-PRE: 52 %
FEV1-%Pred-Post: 50 %
FEV1-POST: 1.18 L
FEV1-Pre: 1.25 L
FEV1FVC-%CHANGE-POST: 3 %
FEV1FVC-%Pred-Pre: 102 %
FEV6-%CHANGE-POST: -7 %
FEV6-%PRED-PRE: 51 %
FEV6-%Pred-Post: 47 %
FEV6-PRE: 1.48 L
FEV6-Post: 1.36 L
FEV6FVC-%PRED-PRE: 103 %
FEV6FVC-%Pred-Post: 103 %
FVC-%CHANGE-POST: -7 %
FVC-%PRED-POST: 46 %
FVC-%PRED-PRE: 50 %
FVC-POST: 1.36 L
FVC-PRE: 1.48 L
POST FEV1/FVC RATIO: 87 %
POST FEV6/FVC RATIO: 100 %
Pre FEV1/FVC ratio: 84 %
Pre FEV6/FVC Ratio: 100 %
RV % pred: 98 %
RV: 1.73 L
TLC % PRED: 59 %
TLC: 3.02 L

## 2017-01-04 ENCOUNTER — Ambulatory Visit: Payer: Medicaid Other | Admitting: Registered"

## 2017-01-13 ENCOUNTER — Encounter: Payer: Medicaid Other | Attending: Internal Medicine | Admitting: Registered"

## 2017-01-13 ENCOUNTER — Encounter: Payer: Self-pay | Admitting: Registered"

## 2017-01-13 DIAGNOSIS — Z713 Dietary counseling and surveillance: Secondary | ICD-10-CM | POA: Insufficient documentation

## 2017-01-13 NOTE — Progress Notes (Signed)
Medical Nutrition Therapy:  Appt start time: 1107 end time:  1207.   Assessment:  Primary concerns today: Pt referred for obesity. Pt says she would like help with weight loss because she hopes losing weight will reduce stress on her lungs. She says she is hungry on and off and that causes her to eat throughout the day. Pt says her doctor discussed consuming a low sodium diet, but she has not yet started making those changes and she does not look at nutrition labels. However, pt says she has stopped adding salt to her meals prepared at home.   Preferred Learning Style:   No preference indicated   Learning Readiness:   Contemplating   MEDICATIONS: See list.    DIETARY INTAKE:  Usual eating pattern includes more than 3 meals and several snacks per day.  Pt says she often feels hungry throughout the day and she eats all day long. Pt says she goes to sleep around 9-10 pm and wakes up around 2 am and will prepare her a meal. Pt says meals at home are eaten in front of the TV because having the TV on helps reduce her anxiety.   Everyday foods include Raisin Bran cereal.  Avoided foods include none reported.    24-hr recall:  (2-3 AM): hamburger, cereal with [place], almond, or whole milk B ( AM): fried chicken, mashed potatoes and gravy, green beans OR hamburger and fries and a shake   Snk ( AM): candy-gummies, chocolate, etc.  L ( PM): fried chicken, mashed potatoes and gravy, green beans OR hamburger and fries and a shake OR sandwich with chips and a soda  Snk ( PM): oatmeal cakes OR candy D ( PM): spaghetti and salad (lettuce, carrots, tomatoes, cucumbers, cheese, and ranch or thousand [employer] dressing) Snk ( PM): cookies, chips, chocolate and soda  Beverages: milk, soda; cranberry or orange juice sometimes   Usual physical activity: house cleaning, short walks.   Progress Towards Goal(s):  In progress.   Nutritional Diagnosis:  NI-5.11.1 Predicted suboptimal nutrient intake As related  to diet high in simple sugars and saturated fat and low in whole grains and fruit.  As evidenced by pt's diet recall .    Intervention:  Nutrition counseling provided. Dietitian provided education on balanced nutrition and how eating balanced meals can help increase satiety and prevent pt from feeling hungry throughout the day. Dietitian educated pt on heart healthy nutrition and using the nutrition label to assess sodium content of foods. Dietitian dicussed healthy snack options with pt that will help increase satiety. Dietitian recommended pt follow activity recommendations given by her physician to prevent overexerting herself. Dietitian discussed mindful eating with pt and encouraged pt to try to be mindful of her body's hungry/satiety cues when eating. Pt appeared agreeable to information/goals dicussed.   Goals:  Try to eat balanced meals like the plate example (see handout). Getting in balanced meals can help reduce feeling hungry throughout the day.   For snacks-try to have healthy snacks and those that provide protein and fiber which will stay with you longer than sweets. Some possible snack ideas include [place] yogurt, whole grain crackers (low sodium), unsalted nuts, fruit with low sodium peanut butter, etc.   Try to check nutrition labels to assess how much sodium foods contain so you can choose low sodium foods (see handouts).   Teaching Method Utilized:  Visual Auditory  Handouts given during visit include:  Balanced plate with list of foods.   Heart Healthy  Nutrition   Low Sodium Nutrition Therapy and Nutrition Label Tips   Low Fat Baking Tips   Barriers to learning/adherence to lifestyle change: None indicated.   Demonstrated degree of understanding via:  Teach Back   Monitoring/Evaluation:  Dietary intake, exercise, and body weight in 1 month(s).

## 2017-01-13 NOTE — Patient Instructions (Addendum)
Try to eat balanced meals like the plate example (see handout). Getting in balanced meals can help reduce feeling hungry throughout the day.   For snacks-try to have healthy snacks and those that provide protein and fiber which will stay with you longer than sweets. Some possible snack ideas include [place] yogurt, whole grain crackers (low sodium), unsalted nuts, fruit with low sodium peanut butter, etc.   Try to check nutrition labels to assess how much sodium foods contain so you can choose low sodium foods (see handouts).

## 2017-02-17 ENCOUNTER — Ambulatory Visit: Payer: Medicaid Other | Admitting: Registered"

## 2017-03-23 ENCOUNTER — Inpatient Hospital Stay (HOSPITAL_COMMUNITY): Payer: Medicaid Other

## 2017-03-23 ENCOUNTER — Encounter (HOSPITAL_COMMUNITY): Payer: Self-pay | Admitting: Emergency Medicine

## 2017-03-23 ENCOUNTER — Ambulatory Visit (INDEPENDENT_AMBULATORY_CARE_PROVIDER_SITE_OTHER): Payer: Medicaid Other | Admitting: Adult Health

## 2017-03-23 ENCOUNTER — Inpatient Hospital Stay (HOSPITAL_COMMUNITY)
Admission: EM | Admit: 2017-03-23 | Discharge: 2017-03-26 | DRG: 190 | Disposition: A | Discharge status: Home / Self Care | Payer: Medicaid Other | Attending: Family Medicine | Admitting: Family Medicine

## 2017-03-23 ENCOUNTER — Emergency Department (HOSPITAL_COMMUNITY): Payer: Medicaid Other

## 2017-03-23 ENCOUNTER — Encounter: Payer: Self-pay | Admitting: Adult Health

## 2017-03-23 DIAGNOSIS — J9621 Acute and chronic respiratory failure with hypoxia: Secondary | ICD-10-CM | POA: Diagnosis present

## 2017-03-23 DIAGNOSIS — F1010 Alcohol abuse, uncomplicated: Secondary | ICD-10-CM | POA: Diagnosis present

## 2017-03-23 DIAGNOSIS — F17210 Nicotine dependence, cigarettes, uncomplicated: Secondary | ICD-10-CM | POA: Diagnosis present

## 2017-03-23 DIAGNOSIS — R590 Localized enlarged lymph nodes: Secondary | ICD-10-CM | POA: Diagnosis not present

## 2017-03-23 DIAGNOSIS — J441 Chronic obstructive pulmonary disease with (acute) exacerbation: Secondary | ICD-10-CM | POA: Diagnosis not present

## 2017-03-23 DIAGNOSIS — Z79899 Other long term (current) drug therapy: Secondary | ICD-10-CM | POA: Diagnosis not present

## 2017-03-23 DIAGNOSIS — I5031 Acute diastolic (congestive) heart failure: Secondary | ICD-10-CM | POA: Diagnosis not present

## 2017-03-23 DIAGNOSIS — F209 Schizophrenia, unspecified: Secondary | ICD-10-CM | POA: Diagnosis present

## 2017-03-23 DIAGNOSIS — K047 Periapical abscess without sinus: Secondary | ICD-10-CM | POA: Diagnosis present

## 2017-03-23 DIAGNOSIS — J440 Chronic obstructive pulmonary disease with acute lower respiratory infection: Secondary | ICD-10-CM | POA: Diagnosis present

## 2017-03-23 DIAGNOSIS — J449 Chronic obstructive pulmonary disease, unspecified: Secondary | ICD-10-CM

## 2017-03-23 DIAGNOSIS — I110 Hypertensive heart disease with heart failure: Secondary | ICD-10-CM | POA: Diagnosis present

## 2017-03-23 DIAGNOSIS — Z9981 Dependence on supplemental oxygen: Secondary | ICD-10-CM | POA: Diagnosis not present

## 2017-03-23 DIAGNOSIS — J189 Pneumonia, unspecified organism: Secondary | ICD-10-CM | POA: Diagnosis present

## 2017-03-23 DIAGNOSIS — G4733 Obstructive sleep apnea (adult) (pediatric): Secondary | ICD-10-CM | POA: Diagnosis present

## 2017-03-23 DIAGNOSIS — R220 Localized swelling, mass and lump, head: Secondary | ICD-10-CM | POA: Diagnosis not present

## 2017-03-23 DIAGNOSIS — R0602 Shortness of breath: Secondary | ICD-10-CM | POA: Diagnosis present

## 2017-03-23 DIAGNOSIS — J9611 Chronic respiratory failure with hypoxia: Secondary | ICD-10-CM | POA: Diagnosis not present

## 2017-03-23 DIAGNOSIS — J9601 Acute respiratory failure with hypoxia: Secondary | ICD-10-CM | POA: Diagnosis present

## 2017-03-23 LAB — CBC WITH DIFFERENTIAL/PLATELET
Basophils Absolute: 0 10*3/uL (ref 0.0–0.1)
Basophils Relative: 0 %
EOS PCT: 2 %
Eosinophils Absolute: 0.3 10*3/uL (ref 0.0–0.7)
HCT: 51.5 % — ABNORMAL HIGH (ref 36.0–46.0)
Hemoglobin: 15.7 g/dL — ABNORMAL HIGH (ref 12.0–15.0)
LYMPHS ABS: 6.2 10*3/uL — AB (ref 0.7–4.0)
Lymphocytes Relative: 50 %
MCH: 25.4 pg — AB (ref 26.0–34.0)
MCHC: 30.5 g/dL (ref 30.0–36.0)
MCV: 83.5 fL (ref 78.0–100.0)
MONO ABS: 1.1 10*3/uL — AB (ref 0.1–1.0)
Monocytes Relative: 9 %
NEUTROS ABS: 4.9 10*3/uL (ref 1.7–7.7)
NEUTROS PCT: 39 %
PLATELETS: 195 10*3/uL (ref 150–400)
RBC: 6.17 MIL/uL — ABNORMAL HIGH (ref 3.87–5.11)
RDW: 16.8 % — AB (ref 11.5–15.5)
WBC: 12.5 10*3/uL — ABNORMAL HIGH (ref 4.0–10.5)

## 2017-03-23 LAB — COMPREHENSIVE METABOLIC PANEL
ALT: 45 U/L (ref 14–54)
ANION GAP: 5 (ref 5–15)
AST: 37 U/L (ref 15–41)
Albumin: 3.6 g/dL (ref 3.5–5.0)
Alkaline Phosphatase: 75 U/L (ref 38–126)
BUN: 6 mg/dL (ref 6–20)
CHLORIDE: 101 mmol/L (ref 101–111)
CO2: 31 mmol/L (ref 22–32)
CREATININE: 0.72 mg/dL (ref 0.44–1.00)
Calcium: 9.2 mg/dL (ref 8.9–10.3)
Glucose, Bld: 103 mg/dL — ABNORMAL HIGH (ref 65–99)
Potassium: 4.2 mmol/L (ref 3.5–5.1)
SODIUM: 137 mmol/L (ref 135–145)
Total Bilirubin: 0.7 mg/dL (ref 0.3–1.2)
Total Protein: 7.2 g/dL (ref 6.5–8.1)

## 2017-03-23 LAB — I-STAT CG4 LACTIC ACID, ED: Lactic Acid, Venous: 0.86 mmol/L (ref 0.5–1.9)

## 2017-03-23 LAB — I-STAT TROPONIN, ED: TROPONIN I, POC: 0 ng/mL (ref 0.00–0.08)

## 2017-03-23 LAB — BRAIN NATRIURETIC PEPTIDE: B NATRIURETIC PEPTIDE 5: 59.5 pg/mL (ref 0.0–100.0)

## 2017-03-23 MED ORDER — VITAMIN B-1 100 MG PO TABS
100.0000 mg | ORAL_TABLET | Freq: Every day | ORAL | Status: DC
  Administered 2017-03-24 – 2017-03-26 (×3): 100 mg via ORAL
  Filled 2017-03-23 (×3): qty 1

## 2017-03-23 MED ORDER — IOPAMIDOL (ISOVUE-300) INJECTION 61%
INTRAVENOUS | Status: AC
  Administered 2017-03-23: 75 mL
  Filled 2017-03-23: qty 75

## 2017-03-23 MED ORDER — ARIPIPRAZOLE 5 MG PO TABS
20.0000 mg | ORAL_TABLET | Freq: Every day | ORAL | Status: DC
  Administered 2017-03-24 – 2017-03-26 (×3): 20 mg via ORAL
  Filled 2017-03-23 (×4): qty 4

## 2017-03-23 MED ORDER — ACETAMINOPHEN 325 MG PO TABS
650.0000 mg | ORAL_TABLET | Freq: Four times a day (QID) | ORAL | Status: DC | PRN
  Administered 2017-03-24 (×3): 650 mg via ORAL
  Filled 2017-03-23 (×3): qty 2

## 2017-03-23 MED ORDER — LORAZEPAM 1 MG PO TABS: 1.0000 mg | ORAL_TABLET | Freq: Four times a day (QID) | ORAL | Status: DC | PRN

## 2017-03-23 MED ORDER — IPRATROPIUM-ALBUTEROL 0.5-2.5 (3) MG/3ML IN SOLN
3.0000 mL | RESPIRATORY_TRACT | Status: DC
  Administered 2017-03-24: 3 mL via RESPIRATORY_TRACT
  Filled 2017-03-23: qty 3

## 2017-03-23 MED ORDER — THIAMINE HCL 100 MG/ML IJ SOLN: 100.0000 mg | Freq: Every day | INTRAMUSCULAR | Status: DC

## 2017-03-23 MED ORDER — DEXTROSE 5 % IV SOLN
500.0000 mg | INTRAVENOUS | Status: DC
  Administered 2017-03-24 – 2017-03-25 (×2): 500 mg via INTRAVENOUS
  Filled 2017-03-23 (×3): qty 500

## 2017-03-23 MED ORDER — TRAZODONE HCL 100 MG PO TABS
100.0000 mg | ORAL_TABLET | Freq: Every day | ORAL | Status: DC
  Administered 2017-03-24 – 2017-03-25 (×3): 100 mg via ORAL
  Filled 2017-03-23 (×3): qty 1

## 2017-03-23 MED ORDER — PREDNISONE 20 MG PO TABS
60.0000 mg | ORAL_TABLET | Freq: Once | ORAL | Status: AC
  Administered 2017-03-23: 60 mg via ORAL
  Filled 2017-03-23: qty 3

## 2017-03-23 MED ORDER — ACETAMINOPHEN 650 MG RE SUPP: 650.0000 mg | Freq: Four times a day (QID) | RECTAL | Status: DC | PRN

## 2017-03-23 MED ORDER — ACETAMINOPHEN 325 MG PO TABS
650.0000 mg | ORAL_TABLET | Freq: Once | ORAL | Status: AC
  Administered 2017-03-23: 650 mg via ORAL
  Filled 2017-03-23: qty 2

## 2017-03-23 MED ORDER — BUDESONIDE-FORMOTEROL FUMARATE 160-4.5 MCG/ACT IN AERO: 2.0000 | INHALATION_SPRAY | Freq: Two times a day (BID) | RESPIRATORY_TRACT | 5 refills | Status: DC

## 2017-03-23 MED ORDER — FUROSEMIDE 10 MG/ML IJ SOLN
40.0000 mg | Freq: Every day | INTRAMUSCULAR | Status: DC
  Administered 2017-03-24 – 2017-03-26 (×3): 40 mg via INTRAVENOUS
  Filled 2017-03-23 (×3): qty 4

## 2017-03-23 MED ORDER — GABAPENTIN 300 MG PO CAPS
600.0000 mg | ORAL_CAPSULE | Freq: Every day | ORAL | Status: DC
  Administered 2017-03-24 – 2017-03-25 (×3): 600 mg via ORAL
  Filled 2017-03-23 (×3): qty 2

## 2017-03-23 MED ORDER — METHYLPREDNISOLONE SODIUM SUCC 40 MG IJ SOLR
40.0000 mg | Freq: Every day | INTRAMUSCULAR | Status: DC
  Administered 2017-03-24 – 2017-03-26 (×3): 40 mg via INTRAVENOUS
  Filled 2017-03-23 (×3): qty 1

## 2017-03-23 MED ORDER — LIDOCAINE-EPINEPHRINE (PF) 2 %-1:200000 IJ SOLN
10.0000 mL | Freq: Once | INTRAMUSCULAR | Status: AC
  Administered 2017-03-23: 10 mL
  Filled 2017-03-23: qty 20

## 2017-03-23 MED ORDER — BUPROPION HCL ER (SR) 150 MG PO TB12
150.0000 mg | ORAL_TABLET | Freq: Two times a day (BID) | ORAL | Status: DC
  Administered 2017-03-24 – 2017-03-26 (×5): 150 mg via ORAL
  Filled 2017-03-23 (×5): qty 1

## 2017-03-23 MED ORDER — IPRATROPIUM-ALBUTEROL 0.5-2.5 (3) MG/3ML IN SOLN: 3.0000 mL | RESPIRATORY_TRACT | Status: DC | PRN

## 2017-03-23 MED ORDER — AZITHROMYCIN 250 MG PO TABS
500.0000 mg | ORAL_TABLET | Freq: Once | ORAL | Status: AC
  Administered 2017-03-23: 500 mg via ORAL
  Filled 2017-03-23: qty 2

## 2017-03-23 MED ORDER — BUDESONIDE 0.25 MG/2ML IN SUSP
0.2500 mg | Freq: Two times a day (BID) | RESPIRATORY_TRACT | Status: DC
  Administered 2017-03-24: 0.25 mg via RESPIRATORY_TRACT
  Filled 2017-03-23: qty 2

## 2017-03-23 MED ORDER — IPRATROPIUM BROMIDE 0.02 % IN SOLN
0.5000 mg | Freq: Once | RESPIRATORY_TRACT | Status: AC
  Administered 2017-03-23: 0.5 mg via RESPIRATORY_TRACT
  Filled 2017-03-23: qty 2.5

## 2017-03-23 MED ORDER — ENOXAPARIN SODIUM 60 MG/0.6ML ~~LOC~~ SOLN
60.0000 mg | SUBCUTANEOUS | Status: DC
  Administered 2017-03-24 – 2017-03-26 (×3): 60 mg via SUBCUTANEOUS
  Filled 2017-03-23 (×3): qty 0.6

## 2017-03-23 MED ORDER — FOLIC ACID 1 MG PO TABS
1.0000 mg | ORAL_TABLET | Freq: Every day | ORAL | Status: DC
  Administered 2017-03-24 – 2017-03-26 (×3): 1 mg via ORAL
  Filled 2017-03-23 (×3): qty 1

## 2017-03-23 MED ORDER — ADULT MULTIVITAMIN W/MINERALS CH
1.0000 | ORAL_TABLET | Freq: Every day | ORAL | Status: DC
  Administered 2017-03-24 – 2017-03-26 (×3): 1 via ORAL
  Filled 2017-03-23 (×3): qty 1

## 2017-03-23 MED ORDER — LORAZEPAM 2 MG/ML IJ SOLN
1.0000 mg | Freq: Four times a day (QID) | INTRAMUSCULAR | Status: DC | PRN
  Filled 2017-03-23: qty 1

## 2017-03-23 MED ORDER — DEXTROSE 5 % IV SOLN
1.0000 g | INTRAVENOUS | Status: DC
  Administered 2017-03-24 – 2017-03-25 (×2): 1 g via INTRAVENOUS
  Filled 2017-03-23 (×3): qty 10

## 2017-03-23 MED ORDER — DEXTROSE 5 % IV SOLN
1.0000 g | Freq: Once | INTRAVENOUS | Status: AC
  Administered 2017-03-23: 1 g via INTRAVENOUS
  Filled 2017-03-23: qty 10

## 2017-03-23 MED ORDER — LORAZEPAM 1 MG PO TABS
0.0000 mg | ORAL_TABLET | Freq: Four times a day (QID) | ORAL | Status: AC
  Filled 2017-03-23: qty 1

## 2017-03-23 MED ORDER — GABAPENTIN 300 MG PO CAPS
300.0000 mg | ORAL_CAPSULE | Freq: Three times a day (TID) | ORAL | Status: DC
  Administered 2017-03-24 – 2017-03-26 (×8): 300 mg via ORAL
  Filled 2017-03-23 (×8): qty 1

## 2017-03-23 MED ORDER — ALBUTEROL SULFATE (2.5 MG/3ML) 0.083% IN NEBU
5.0000 mg | INHALATION_SOLUTION | Freq: Once | RESPIRATORY_TRACT | Status: AC
  Administered 2017-03-23: 5 mg via RESPIRATORY_TRACT
  Filled 2017-03-23: qty 6

## 2017-03-23 MED ORDER — LORAZEPAM 1 MG PO TABS: 0.0000 mg | ORAL_TABLET | Freq: Two times a day (BID) | ORAL | Status: DC

## 2017-03-23 NOTE — Addendum Note (Signed)
Addended by: Parke Poisson E on: 03/23/2017 05:22 PM   Modules accepted: Orders

## 2017-03-23 NOTE — H&P (Addendum)
History and Physical    ARTHUR AYDELOTTE GEX:[id number] DOB: [birth year] DOA: 03/23/2017  PCP: Nolene Ebbs, MD  Patient coming from: home.  Chief Complaint: shortness of breath and facial swelling.  HPI: Diane Gilbert is a 51 y.o. female with history of COPD tobacco abuse CHF interstitial lung disease alcohol abuse and ongoing tobacco abuse presents to the ER because of shortness of breath and right facial swelling. Patient states over the last few weeks patient has been getting increasingly short of breath mostly on exertion. Denies any chest pain. Denies any fever or chills but has been having productive cough. Patient also has been having increasing lower extremity edema. In addition patient over the last 3 days has noticed swelling of the right side of the face and mostly related to her tooth in the lower right jaw. Patient had gone to her PCP and over there on exam patient also had some right facial swelling. Patient is not able to exactly say how long she been having this swelling. Denies any difficulty swallowing. Denies any new medications.   ED Course: in the ER patient had chest x-ray which were showing possible infiltrates. CT chest was done which shows worsening bilateral hilar lymphadenopathy and also showed patient's known history of interstitial lung disease. Patient was started on antibiotics for possible pneumonia initially. I have also place patient on Lasix and admitted for further management. In the ER patient also had incision and drainage done of the possible tooth abscess by the ER physician.  Review of Systems: As per HPI, rest all negative.   Past Medical History:  Diagnosis Date  . Alcohol abuse   . Anxiety   . Arthritis   . Depression   . Headache(784.0)   . Hypertension   . Insomnia   . Interstitial lung disease ([place])   . Oxygen dependent   . Schizophrenia ([place])   . Shortness of breath dyspnea   . Sleep apnea   . Sleep apnea     Past  Surgical History:  Procedure Laterality Date  . BACK SURGERY    . LUNG BIOPSY Left 07/16/2014   Procedure: LUNG BIOPSY;  Surgeon: Grace Isaac, MD;  Location: [facility];  Service: Thoracic;  Laterality: Left;  Marland Kitchen MULTIPLE TOOTH EXTRACTIONS    . VIDEO ASSISTED THORACOSCOPY Left 07/16/2014   Procedure: VIDEO ASSISTED THORACOSCOPY;  Surgeon: Grace Isaac, MD;  Location: [place];  Service: Thoracic;  Laterality: Left;  Marland Kitchen VIDEO BRONCHOSCOPY Bilateral 04/11/2014   Procedure: VIDEO BRONCHOSCOPY WITH FLUORO;  Surgeon: Kathee Delton, MD;  Location: [facility];  Service: Cardiopulmonary;  Laterality: Bilateral;  . VIDEO BRONCHOSCOPY N/A 07/16/2014   Procedure: VIDEO BRONCHOSCOPY;  Surgeon: Grace Isaac, MD;  Location: [facility] OR;  Service: Thoracic;  Laterality: N/A;  . WEDGE RESECTION Left 07/16/2014   Procedure: WEDGE RESECTION;  Surgeon: Grace Isaac, MD;  Location: [place];  Service: Thoracic;  Laterality: Left;  left upper lobe lung resection     reports that she has been smoking Cigarettes.  She has a 45.00 pack-year smoking history. She has never used smokeless tobacco. She reports that she drinks alcohol. She reports that she does not use drugs.  No Known Allergies  Family History  Problem Relation Age of Onset  . Breast cancer Mother   . COPD Maternal Aunt     Prior to Admission medications   Medication Sig Start Date End Date Taking? Authorizing Provider  albuterol (PROVENTIL HFA;VENTOLIN HFA) 108 (90 BASE)  MCG/ACT inhaler Inhale 2 puffs into the lungs every 6 (six) hours as needed for wheezing or shortness of breath. 05/09/15  Yes Juanito Doom, MD  albuterol (PROVENTIL) (2.5 MG/3ML) 0.083% nebulizer solution Take 2.5 mg by nebulization every 6 (six) hours as needed for wheezing or shortness of breath.   Yes [provider]  ARIPiprazole (ABILIFY) 20 MG tablet Take 20 mg by mouth daily.   Yes [provider]  buPROPion (WELLBUTRIN SR) 150 MG 12 hr tablet  Take 150 mg by mouth See admin instructions. Take 1 tablet (150 mg) by mouth every morning and at noon   Yes [provider]  gabapentin (NEURONTIN) 300 MG capsule Take 300-600 mg by mouth See admin instructions. 1 capsule three times daily, with 2 at bedtime   Yes [provider]  oxyCODONE-acetaminophen (PERCOCET/ROXICET) 5-325 MG tablet Take 1 tablet by mouth every 6 (six) hours as needed for severe pain.   Yes [provider]  OXYGEN Inhale 2 L into the lungs continuous.   Yes [provider]  traZODone (DESYREL) 100 MG tablet Take 100 mg by mouth at bedtime.    Yes [provider]  budesonide-formoterol (SYMBICORT) 160-4.5 MCG/ACT inhaler Inhale 2 puffs into the lungs 2 (two) times daily. Patient not taking: Reported on 03/23/2017 09/01/16   Juanito Doom, MD  budesonide-formoterol [facility] 160-4.5 MCG/ACT inhaler Inhale 2 puffs into the lungs 2 (two) times daily. Patient not taking: Reported on 03/23/2017 03/23/17   Parrett, Fonnie Mu, NP  furosemide (LASIX) 40 MG tablet Take 1 tablet (40 mg total) by mouth daily. Patient not taking: Reported on 03/23/2017 11/04/16 11/07/16  Kalman Shan Ratliff, DO  guaiFENesin (MUCINEX) 600 MG 12 hr tablet Take 1 tablet (600 mg total) by mouth 2 (two) times daily. Patient not taking: Reported on 03/23/2017 07/07/16   Mendel Corning, MD    Physical Exam: Vitals:   03/23/17 2045 03/23/17 2115 03/23/17 2200 03/23/17 2230  BP: (!) 165/82 (!) 156/89 (!) 189/99 112/67  Pulse: 88 92 90 89  Resp: (!) 24 (!) 28 (!) 22 (!) 22  Temp:      TempSrc:      SpO2: 92% 91% 91% 95%      Constitutional: moderately built and nourished. Vitals:   03/23/17 2045 03/23/17 2115 03/23/17 2200 03/23/17 2230  BP: (!) 165/82 (!) 156/89 (!) 189/99 112/67  Pulse: 88 92 90 89  Resp: (!) 24 (!) 28 (!) 22 (!) 22  Temp:      TempSrc:      SpO2: 92% 91% 91% 95%   Eyes: anicteric no pallor. ENMT: mild facial swelling on the right  side. No difficulty in opening the mouth. No tongue swelling. Neck: no JVD appreciated. No mass felt. Respiratory: no rhonchi or crepitations. Cardiovascular: S1-S2 heard no murmurs appreciated. Abdomen: soft nontender bowel sounds present. Musculoskeletal: mild lower extremity edema bilaterally. Skin: no rash. Neurologic:alert awake oriented to time place and person. Moves all extremities. Psychiatric: appears normal. Normal affect.   Labs on Admission: I have personally reviewed following labs and imaging studies  CBC:  Recent Labs Lab 03/23/17 1842  WBC 12.5*  NEUTROABS 4.9  HGB 15.7*  HCT 51.5*  MCV 83.5  PLT 295   Basic Metabolic Panel:  Recent Labs Lab 03/23/17 1842  NA 137  K 4.2  CL 101  CO2 31  GLUCOSE 103*  BUN 6  CREATININE 0.72  CALCIUM 9.2   GFR: Estimated Creatinine Clearance: 108.3 mL/min (  by C-G formula based on SCr of 0.72 mg/dL). Liver Function Tests:  Recent Labs Lab 03/23/17 1842  AST 37  ALT 45  ALKPHOS 75  BILITOT 0.7  PROT 7.2  ALBUMIN 3.6   No results for input(s): LIPASE, AMYLASE in the last 168 hours. No results for input(s): AMMONIA in the last 168 hours. Coagulation Profile: No results for input(s): INR, PROTIME in the last 168 hours. Cardiac Enzymes: No results for input(s): CKTOTAL, CKMB, CKMBINDEX, TROPONINI in the last 168 hours. BNP (last 3 results) No results for input(s): PROBNP in the last 8760 hours. HbA1C: No results for input(s): HGBA1C in the last 72 hours. CBG: No results for input(s): GLUCAP in the last 168 hours. Lipid Profile: No results for input(s): CHOL, HDL, LDLCALC, TRIG, CHOLHDL, LDLDIRECT in the last 72 hours. Thyroid Function Tests: No results for input(s): TSH, T4TOTAL, FREET4, T3FREE, THYROIDAB in the last 72 hours. Anemia Panel: No results for input(s): VITAMINB12, FOLATE, FERRITIN, TIBC, IRON, RETICCTPCT in the last 72 hours. Urine analysis:    Component Value Date/Time   COLORURINE AMBER  (A) 03/24/2016 1530   APPEARANCEUR CLOUDY (A) 03/24/2016 1530   LABSPEC 1.024 03/24/2016 1530   PHURINE 6.0 03/24/2016 1530   GLUCOSEU NEGATIVE 03/24/2016 1530   HGBUR NEGATIVE 03/24/2016 1530   BILIRUBINUR NEGATIVE 03/24/2016 1530   KETONESUR NEGATIVE 03/24/2016 1530   PROTEINUR 30 (A) 03/24/2016 1530   UROBILINOGEN 0.2 07/13/2014 1122   NITRITE NEGATIVE 03/24/2016 1530   LEUKOCYTESUR NEGATIVE 03/24/2016 1530   Sepsis Labs: _0 (procalcitonin:4,lacticidven:4) )No results found for this or any previous visit (from the past 240 hour(s)).   Radiological Exams on Admission: Dg Chest 2 View  Result Date: 03/23/2017 CLINICAL DATA:  Shortness of breath cough and chest pain EXAM: CHEST  2 VIEW COMPARISON:  September 01, 2016 FINDINGS: There is underlying reticulonodular interstitial disease, stable. : There is soft tissue prominence in the right perihilar region. It is difficult to ascertain whether this area represents consolidation versus a mass/ adenopathy in the perihilar region. There is cardiomegaly with pulmonary venous hypertension. No bone lesions are evident. Beyond potential right perihilar adenopathy, no other areas suggest adenopathy by radiography. IMPRESSION: 1. Question infiltrate right perihilar region versus mass/adenopathy. This area warrants correlation with contrast enhanced chest CT to further evaluate. 2.  Stable underlying reticulonodular interstitial disease. 3. Pulmonary vascular congestion with cardiomegaly and pulmonary venous hypertension. Electronically Signed   By: Lowella Grip III M.D.   On: 03/23/2017 17:31   Ct Chest W Contrast  Result Date: 03/23/2017 CLINICAL DATA:  Chest pain shortness of breath EXAM: CT CHEST WITH CONTRAST TECHNIQUE: Multidetector CT imaging of the chest was performed during intravenous contrast administration. CONTRAST:  74m ISOVUE-300 IOPAMIDOL (ISOVUE-300) INJECTION 61% COMPARISON:  Radiograph 03/23/2017, CT chest 03/24/2016,  01/22/2014 FINDINGS: Cardiovascular: Suboptimal opacification. nonaneurysmal aorta. Minimal atherosclerotic calcification. Mild cardiomegaly. Small pericardial effusion. Mediastinum/Nodes: Midline trachea. No thyroid mass. Similar appearance of scattered mediastinal lymph nodes. Vague low-density foci within the right greater than left pulmonary hila, consistent with adenopathy, slight increased compared to the prior chest CT. Lungs/Pleura: Mild to moderate emphysema. Diffuse ground-glass density, corresponding to the patient's history of underlying interstitial disease. Overall improved pattern compared with the CT from 2017. No acute consolidations or pleural effusions. Upper Abdomen: No acute abnormality. Musculoskeletal: No acute or suspicious bone lesion. IMPRESSION: 1. Increased radiographic hilar opacity appears to correspond to enlarged right greater than left hilar lymph nodes which have increased compared to the prior CT from 2017. Mild  mediastinal nodes otherwise appear unchanged. 2. Mild to moderate emphysema. Diffuse subtle ground-glass density, felt to correspond to patient's history of underlying interstitial lung disease. There are no acute consolidations or pleural effusion seen. Aortic Atherosclerosis (ICD10-I70.0) and Emphysema (ICD10-J43.9). Electronically Signed   By: Donavan Foil M.D.   On: 03/23/2017 23:11    EKG: Independently reviewed. Normal sinus rhythm. Low voltage.  Assessment/Plan Principal Problem:   Acute respiratory failure with hypoxia (HCC) Active Problems:   Alcohol abuse   Schizophrenia (H[facility])   COPD exacerbation (H[facility])   Acute diastolic congestive heart failure (HCC)   OSA (obstructive sleep apnea)    1. Acute respiratory failure with hypoxia - cause is not clear.Suspect patient may be having some fluid overload for which I have placed patient on Lasix40 mg IV daily and one dose now. Patient used to be on Lasix previously. With no history of COPD and productive  cough patient is continued on DuoNeb's Pulmicort and steroids. 2. Bilateral worsening hilar lymphadenopathy with known history of interstitial lung disease - may need pulmonology consulted in a.m.Marland Kitchen Patient follows with Dr. Lake Bells. Patient may need biopsy. 3. Right facial swelling with possible tooth abscess status post I and D by the ER physician - says it is similar some swelling around the area will get a CT maxillofacial. Continue antibiotics. 4. Diastolic dysfunction per 2-D echodone in October 2017 - EF was 65-70% with grade 2 diastolic dysfunction. Place patient on Lasix for now. Closely follow intake output metabolic panel and daily weights. 5. Interstitial lung disease - respiratory bronchiolitis see #2. 6. Alcohol abuse - patient placed on  CIWA.Marland Kitchen Advised to quit drinking.  I have reviewed patient's old charts and labs.   DVT prophylaxis: Lovenox. Code Status: full code.  Family Communication: discussed with patient.  Disposition Plan: home.  Consults called: none.  Admission status: inpatient.    Rise Patience MD [facility] Pager [phone].  If 7PM-7AM, please contact night-coverage [email or web address] Password [facility]  03/23/2017, 11:25 PM

## 2017-03-23 NOTE — Patient Instructions (Signed)
Transfer to ER .  Restart Symbicort 2 puffs Twice daily  , rinse after use.  Quit smoking  Follow up with Dr. Lake Bells in 4 weeks and As needed   Please contact office for sooner follow up if symptoms do not improve or worsen or seek emergency care

## 2017-03-23 NOTE — Assessment & Plan Note (Signed)
Progressive right facial swelling with suspected dental abscess +/- facial cellulitis . Worry for worsening spread of infection and airway compromise with swelling .  Will  Need further evaluation and transfer to ER .  EMS called for transport .

## 2017-03-23 NOTE — ED Provider Notes (Signed)
[facility] DEPT Provider Note   CSN: [id number] Arrival date & time: 03/23/17  1633     History   Chief Complaint Chief Complaint  Patient presents with  . Shortness of Breath  . Dental Pain    HPI MAIRIM BADE is a 51 y.o. female.  Patient is a 51 year old female with a history of hypertension, alcohol abuse, schizophrenia, COPD on 2 L of oxygen chronically, congestive heart failure presenting today from PCP office with multiple complaints. Patient states for the last 1 month she's had worsening shortness of breath and not feeling well. However 3 days ago patient's symptoms significantly worsened. She was having a dry cough but approximately 2-3 days ago she started to develop a productive cough. She has felt hot and chilled intermittently but is unclear she's been running a fever. Her inhalers do not seem to be helping. She denies any chest pain except for pain with coughing. She's had no abdominal pain, nausea or vomiting. She also notes in the last 3 days she's had worsening swelling in bilateral lower extremities. Patient also has noted swelling to the right lower jaw that has worsened since Friday and is now having significant dental pain as well.  Patient went to her PCP office today who sent her here for further evaluation. She denies any recent antibiotic or steroid use.   The history is provided by the patient.    Past Medical History:  Diagnosis Date  . Alcohol abuse   . Anxiety   . Arthritis   . Depression   . Headache(784.0)   . Hypertension   . Insomnia   . Interstitial lung disease ([place])   . Oxygen dependent   . Schizophrenia ([place])   . Shortness of breath dyspnea   . Sleep apnea   . Sleep apnea     Patient Active Problem List   Diagnosis Date Noted  . Facial swelling 03/23/2017  . Chronic respiratory failure with hypoxia ([place]) 03/23/2017  . OSA (obstructive sleep apnea) 07/21/2016  . Increased oxygen demand 07/04/2016  . COPD with acute  exacerbation ([place]) 07/04/2016  . COPD (chronic obstructive pulmonary disease) ([place]) 07/04/2016  . Acute diastolic congestive heart failure ([place])   . Hypoxia 03/24/2016  . Schizophrenia ([place]) 03/24/2016  . Leg swelling 01/16/2016  . Dyspnea 09/06/2015  . Acute on chronic respiratory failure with hypoxia ([place]) 06/07/2014  . Respiratory bronchiolitis interstitial lung disease ([place]) 12/26/2013  . Irregular menstrual cycle 01/23/2013  . Smoking addiction 01/23/2013  . Alcohol abuse 02/19/2012  . Hyponatremia 02/19/2012  . Abdominal pain 02/19/2012  . Hypokalemia 02/19/2012    Past Surgical History:  Procedure Laterality Date  . BACK SURGERY    . LUNG BIOPSY Left 07/16/2014   Procedure: LUNG BIOPSY;  Surgeon: Grace Isaac, MD;  Location: [facility];  Service: Thoracic;  Laterality: Left;  Marland Kitchen MULTIPLE TOOTH EXTRACTIONS    . VIDEO ASSISTED THORACOSCOPY Left 07/16/2014   Procedure: VIDEO ASSISTED THORACOSCOPY;  Surgeon: Grace Isaac, MD;  Location: [facility];  Service: Thoracic;  Laterality: Left;  Marland Kitchen VIDEO BRONCHOSCOPY Bilateral 04/11/2014   Procedure: VIDEO BRONCHOSCOPY WITH FLUORO;  Surgeon: Kathee Delton, MD;  Location: [facility];  Service: Cardiopulmonary;  Laterality: Bilateral;  . VIDEO BRONCHOSCOPY N/A 07/16/2014   Procedure: VIDEO BRONCHOSCOPY;  Surgeon: Grace Isaac, MD;  Location: [facility];  Service: Thoracic;  Laterality: N/A;  . WEDGE RESECTION Left 07/16/2014   Procedure: WEDGE RESECTION;  Surgeon: Grace Isaac, MD;  Location: [facility] [address];  Service: Thoracic;  Laterality: Left;  left upper lobe lung resection    OB History    Gravida Para Term Preterm AB Living   0 0 0 0 0 0   SAB TAB Ectopic Multiple Live Births   0 0 0 0         Home Medications    Prior to Admission medications   Medication Sig Start Date End Date Taking? Authorizing Provider  albuterol (PROVENTIL HFA;VENTOLIN HFA) 108 (90 BASE) MCG/ACT inhaler Inhale 2 puffs into the lungs every 6 (six) hours as  needed for wheezing or shortness of breath. 05/09/15   Juanito Doom, MD  albuterol (PROVENTIL) (2.5 MG/3ML) 0.083% nebulizer solution Take 2.5 mg by nebulization every 6 (six) hours as needed for wheezing or shortness of breath.    [provider]  ARIPiprazole (ABILIFY) 20 MG tablet Take 20 mg by mouth daily.    [provider]  budesonide-formoterol (SYMBICORT) 160-4.5 MCG/ACT inhaler Inhale 2 puffs into the lungs 2 (two) times daily. Patient not taking: Reported on 03/23/2017 09/01/16   Juanito Doom, MD  buPROPion [facility] SR) 150 MG 12 hr tablet Take 150 mg by mouth See admin instructions. Take 1 tablet (150 mg) by mouth every morning and at noon    [provider]  furosemide (LASIX) 40 MG tablet Take 1 tablet (40 mg total) by mouth daily. 11/04/16 11/07/16  Kalman Shan Ratliff, DO  gabapentin (NEURONTIN) 300 MG capsule 1 capsule three times daily, with 2 at bedtime    [provider]  guaiFENesin (MUCINEX) 600 MG 12 hr tablet Take 1 tablet (600 mg total) by mouth 2 (two) times daily. Patient not taking: Reported on 03/23/2017 07/07/16   Rai, Vernelle Emerald, MD  oxyCODONE-acetaminophen (PERCOCET/ROXICET) 5-325 MG tablet Take 1 tablet by mouth every 6 (six) hours as needed for severe pain.    [provider]  OXYGEN Inhale 2 L into the lungs continuous.    [provider]  QUEtiapine (SEROQUEL) 300 MG tablet Take 300 mg by mouth at bedtime.    [provider]  traZODone (DESYREL) 100 MG tablet Take 100 mg by mouth at bedtime.     [provider]    Family History Family History  Problem Relation Age of Onset  . Breast cancer Mother   . COPD Maternal Aunt     Social History Social History  Substance Use Topics  . Smoking status: Current Some Day Smoker    Packs/day: 1.50    Years: 30.00    Types: Cigarettes  . Smokeless tobacco: Never Used     Comment: smoking 3 cigarettes daily "every now and then"  11/04/16  . Alcohol use 0.0 oz/week     Comment: Pt has previously abused alcohol , pt states she still drinks "every now and then"     Allergies   Patient has no known allergies.   Review of Systems Review of Systems  All other systems reviewed and are negative.    Physical Exam Updated Vital Signs There were no vitals taken for this visit.  Physical Exam  Constitutional: She is oriented to person, place, and time. She appears well-developed and well-nourished.  Ill-appearing. Cheeks are flushed she is mildly diaphoretic  HENT:  Head: Normocephalic and atraumatic.    Mouth/Throat: Oropharynx is clear and moist. Dental caries present.    Eyes: Pupils are equal, round, and reactive to light. Conjunctivae and EOM are normal.  Neck: Normal range of motion. Neck  supple.  Cardiovascular: Normal rate, regular rhythm and intact distal pulses.   No murmur heard. Pulmonary/Chest: Effort normal. Tachypnea noted. No respiratory distress. She has decreased breath sounds. She has wheezes. She has no rales.  Abdominal: Soft. She exhibits no distension. There is no tenderness. There is no rebound and no guarding.  Musculoskeletal: Normal range of motion. She exhibits edema. She exhibits no tenderness.  1+ pitting edema in bilateral lower ext to the mid shin  Neurological: She is alert and oriented to person, place, and time.  Skin: Skin is warm and dry. No rash noted. No erythema.  Psychiatric: She has a normal mood and affect. Her behavior is normal.  Nursing note and vitals reviewed.    ED Treatments / Results  Labs (all labs ordered are listed, but only abnormal results are displayed) Labs Reviewed  CBC WITH DIFFERENTIAL/PLATELET  COMPREHENSIVE METABOLIC PANEL  BRAIN NATRIURETIC PEPTIDE  I-STAT TROPONIN, ED  I-STAT CG4 LACTIC ACID, ED    EKG  EKG Interpretation  Date/Time:  Tuesday March 23 2017 16:43:57 EDT Ventricular Rate:  90 PR Interval:  182 QRS  Duration: 80 QT Interval:  376 QTC Calculation: 459 R Axis:   -38 Text Interpretation:  Sinus rhythm with Fusion complexes Right atrial enlargement Left axis deviation Low voltage QRS Septal infarct , age undetermined Possible Lateral infarct , age undetermined No significant change since last tracing Confirmed by Blanchie Dessert [phone]) on 03/23/2017 5:02:02 PM       Radiology Dg Chest 2 View  Result Date: 03/23/2017 CLINICAL DATA:  Shortness of breath cough and chest pain EXAM: CHEST  2 VIEW COMPARISON:  September 01, 2016 FINDINGS: There is underlying reticulonodular interstitial disease, stable. : There is soft tissue prominence in the right perihilar region. It is difficult to ascertain whether this area represents consolidation versus a mass/ adenopathy in the perihilar region. There is cardiomegaly with pulmonary venous hypertension. No bone lesions are evident. Beyond potential right perihilar adenopathy, no other areas suggest adenopathy by radiography. IMPRESSION: 1. Question infiltrate right perihilar region versus mass/adenopathy. This area warrants correlation with contrast enhanced chest CT to further evaluate. 2.  Stable underlying reticulonodular interstitial disease. 3. Pulmonary vascular congestion with cardiomegaly and pulmonary venous hypertension. Electronically Signed   By: Lowella Grip III M.D.   On: 03/23/2017 17:31    Procedures Procedures (including critical care time)  Medications Ordered in ED Medications  acetaminophen (TYLENOL) tablet 650 mg (not administered)  predniSONE (DELTASONE) tablet 60 mg (not administered)  albuterol (PROVENTIL) (2.5 MG/3ML) 0.083% nebulizer solution 5 mg (not administered)  ipratropium (ATROVENT) nebulizer solution 0.5 mg (not administered)     Initial Impression / Assessment and Plan / ED Course  I have reviewed the triage vital signs and the nursing notes.  Pertinent labs & imaging results that were available during my care of  the patient were reviewed by me and considered in my medical decision making (see chart for details).     INCISION AND DRAINAGE Performed by: Blanchie Dessert Consent: Verbal consent obtained. Risks and benefits: risks, benefits and alternatives were discussed Type: abscess  Body area: right lower jaw  Anesthesia: local infiltration  Incision was made with a scalpel.  Local anesthetic: lidocaine 2% with epinephrine  Anesthetic total: 2 ml  Complexity: simple Drainage: purulent  Drainage amount: scant  Packing material:none Patient tolerance: Patient tolerated the procedure well with no immediate complications.    51 year old female with multiple medical problems presenting with concerns for sepsis. Patient concerning for  pneumonia with new productive cough, shortness of breath, hypoxia at her doctor's office and wheezing. Also potential that this is a COPD exacerbation. However patient feels febrile to touch. Low suspicion for abdominal pathology. Patient also has signs of fluid overload and concern for CHF exacerbation. In addition patient has a dental abscess. Will attempt a drained abscess. CBC, CMP, lactate, BNP, troponin, chest x-ray pending. EKG without significant findings. Patient given a second albuterol Atrovent and prednisone.  Wheezing improved after nebs.  7:02 PM Wheezing improved.  CXR with concern for infiltrate but also recommending CT with contrast.  Labs still pending.  Pt covered with rocephin and azithro.  Gum abscess I&D.  Also appears to be fluid overloaded and pt ran out of lasix about 1-2 months ago  Final Clinical Impressions(s) / ED Diagnoses   Final diagnoses:  COPD exacerbation ([place])  Dental abscess  Community acquired pneumonia, unspecified laterality    New Prescriptions New Prescriptions   No medications on file     Blanchie Dessert, MD 03/23/17 2154

## 2017-03-23 NOTE — Progress Notes (Signed)
_0  ID: [name], female    DOB: 01/09/1966, 51 y.o.   MRN: [id number]  Chief Complaint  Patient presents with  . 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 No BD response. TLC 60%, mild airtrapping. DLCO 45%, but corrects to normal with Av March 2017 pulmonary function testing ratio 85%, FVC 1.61 L (50% predicted) sees, FEV1 1.37 L (53% predicted), total lung capacity 3.14 L (58% predicted), DLCO 13.3 (49% predicted). May 2018 pulmonary function testing ratio 87%, FEV1 1.18 L 50% predicted, FVC 1.36 L 46% predicted, total lung capacity 3.02 L 59% predicted, DLCO 12.65 mL per Min per millimeters of mercury 52% predicted  Path Bronch 03/2014: BAL with 85 WBC's, 21% eos. TBBX with normal lung parenchyma, but not enough for possible specific diagnosis VATS BX 06/2014: RB-ILD (severe), ??? Component NSIP included in the differential  Labs: HP panel negative 03/2014 Autoimmune labs normal 07/2014  6MW March 2017 6 minute walk distance 144 m, O2 saturation nadir 81% on room air April 2018 6 minute walk distance 144 m, O2 saturation nadir 87% on room air   03/23/2017 Acute OV : Facial swelling  Patient presents for an acute office visit. Patient complains over the last 1-2 weeks. She's had worsening facial swelling. Teeth pain. Patient feels that she has a dental abscess along the right lower back teeth. She's been having redness and swelling at the jaw and increased pain. Has noticed that her breathing has been getting worse with increased shortness of breath. Patient was seen at her psychiatrist this morning and noted to have low oxygen level and elevated blood pressure. She was sent to our office for evaluation. On arrival patient was on pulsing oxygen with O2 saturations at 79%. Patient is  supposed to be on continuous flow oxygen at 2 L. She was changed over an O2 saturations were 93%. Blood pressure was okay at 146/76.Marland Kitchen Patient says appetite has been slightly down due to his teeth pain. He denies any vomiting, chest pain, orthopnea, PND, or increased leg swelling. Patient does continue to smoke. Smoking cessation was encouraged. Patient is supposed to be taking Symbicort but had admits she has not been taking lately.Marland Kitchen    No Known Allergies  Immunization History  Administered Date(s) Administered  . Influenza Split 04/03/2016  . Influenza,inj,Quad PF,6+ Mos 03/12/2013, 06/07/2014, 05/13/2015    Past Medical History:  Diagnosis Date  . Alcohol abuse   . Anxiety   . Arthritis   . Depression   . Headache(784.0)   . Hypertension   . Insomnia   . Interstitial lung disease ([place])   . Oxygen dependent   . Schizophrenia ([place])   . Shortness of breath dyspnea   . Sleep apnea   . Sleep apnea     Tobacco History: History  Smoking Status  . Current Some Day Smoker  . Packs/day: 1.50  . Years: 30.00  . Types: Cigarettes  Smokeless Tobacco  . Never Used    Comment: smoking 3 cigarettes daily "every now and then" 11/04/16   Ready to quit: Not Answered Counseling given: Yes   Outpatient Encounter Prescriptions as of 03/23/2017  Medication Sig  . albuterol (PROVENTIL HFA;VENTOLIN HFA) 108 (90 BASE) MCG/ACT inhaler Inhale 2 puffs into the lungs every 6 (six) hours as needed for wheezing or shortness of  breath.  . ARIPiprazole (ABILIFY) 20 MG tablet Take 20 mg by mouth daily.  Marland Kitchen buPROPion (WELLBUTRIN SR) 150 MG 12 hr tablet Take 150 mg by mouth See admin instructions. Take 1 tablet (150 mg) by mouth every morning and at noon  . gabapentin (NEURONTIN) 300 MG capsule 1 capsule three times daily, with 2 at bedtime  . oxyCODONE-acetaminophen (PERCOCET/ROXICET) 5-325 MG tablet Take 1 tablet by mouth every 6 (six) hours as needed for severe pain.  . OXYGEN Inhale 2 L into the  lungs continuous.  Marland Kitchen QUEtiapine (SEROQUEL) 300 MG tablet Take 300 mg by mouth at bedtime.  . traZODone (DESYREL) 100 MG tablet Take 100 mg by mouth at bedtime.   Marland Kitchen albuterol (PROVENTIL) (2.5 MG/3ML) 0.083% nebulizer solution Take 2.5 mg by nebulization every 6 (six) hours as needed for wheezing or shortness of breath.  . budesonide-formoterol (SYMBICORT) 160-4.5 MCG/ACT inhaler Inhale 2 puffs into the lungs 2 (two) times daily. (Patient not taking: Reported on 03/23/2017)  . furosemide (LASIX) 40 MG tablet Take 1 tablet (40 mg total) by mouth daily.  Marland Kitchen guaiFENesin (MUCINEX) 600 MG 12 hr tablet Take 1 tablet (600 mg total) by mouth 2 (two) times daily. (Patient not taking: Reported on 03/23/2017)  . [DISCONTINUED] amLODipine-atorvastatin (CADUET) 5-10 MG tablet Take 1 tablet by mouth daily.  . [DISCONTINUED] furosemide (LASIX) 40 MG tablet Take 40 mg by mouth.  . [DISCONTINUED] OXYCODONE-ACETAMINOPHEN PO Take by mouth.   No facility-administered encounter medications on file as of 03/23/2017.      Review of Systems  Constitutional:   No  weight loss, night sweats,  Fevers, chills, fatigue, or  lassitude.  HEENT:   No headaches,  Difficulty swallowing,  ++Tooth/dental problems,                No sneezing, itching, ear ache, nasal congestion, post nasal drip,   CV:  No chest pain,  Orthopnea, PND, swelling in lower extremities, anasarca, dizziness, palpitations, syncope.   GI  No heartburn, indigestion, abdominal pain, nausea, vomiting, diarrhea, change in bowel habits, loss of appetite, bloody stools.   Resp:   No chest wall deformity  Skin: no rash or lesions.  GU: no dysuria, change in color of urine, no urgency or frequency.  No flank pain, no hematuria   MS:  No joint pain or swelling.  No decreased range of motion.  No back pain.    Physical Exam  BP (!) 144/76 (BP Location: Left Arm, Cuff Size: Large)   Pulse 93   Ht _0  (1.626 m)   Wt 259 lb (117.5 kg)   SpO2 93%   BMI  44.46 kg/m   Temp 97.8    GEN: A/Ox3; pleasant , NAD, obese, on o2    HEENT:  [place]/AT,  EACs-clear, TMs-wnl, NOSE-clear, THROAT-clear, no lesions, no postnasal drip or exudate noted. ++ right facial swelling and redness along jaw-line , ++tenderness   NECK:  Supple w/ fair ROM; no JVD; normal carotid impulses w/o bruits; no thyromegaly or nodules palpated; + right sided cervial/madibular adenopathy , swelling and tenderness   RESP  Few trace rhonchi , . no accessory muscle use, no dullness to percussion  CARD:  RRR, no m/r/g, tr peripheral edema, pulses intact, no cyanosis or clubbing.  GI:   Soft & nt; nml bowel sounds; no organomegaly or masses detected.   Musco: Warm bil, no deformities or joint swelling noted.   Neuro: alert, no focal deficits noted.    Skin:  Warm, no lesions or rashes    Lab Results:  CBC   Imaging: No results found.   Assessment & Plan:   Facial swelling Progressive right facial swelling with suspected dental abscess +/- facial cellulitis . Worry for worsening spread of infection and airway compromise with swelling .  Will  Need further evaluation and transfer to ER .  EMS called for transport .    COPD (chronic obstructive pulmonary disease) (HCC) Restart Symbicort 2 puffs Twice daily  .  Smoking cessation   Chronic respiratory failure with hypoxia (HCC) Cont on O2 , keep sats >90%.      Rexene Edison, NP 03/23/2017

## 2017-03-23 NOTE — Assessment & Plan Note (Signed)
Restart Symbicort 2 puffs Twice daily  .  Smoking cessation

## 2017-03-23 NOTE — ED Notes (Signed)
Admitting Provider at bedside.

## 2017-03-23 NOTE — ED Triage Notes (Signed)
Pt to ER for progressively worsening shortness of breath and new onset right dental pain and swelling with redness to face. Pt sent from PCP for further work up due to low O2 sats at 86% on RA and wheezing. Pt in NAD at present. VSS. Hx of COPD

## 2017-03-23 NOTE — ED Notes (Signed)
Pt able to eat per Dr.Plunkett; Pt provided fluids and meal tray ordered by Secretary

## 2017-03-23 NOTE — Assessment & Plan Note (Signed)
Cont on O2 , keep sats >90%.

## 2017-03-23 NOTE — ED Notes (Signed)
Attempted to call report to floor; RN to call back

## 2017-03-24 ENCOUNTER — Inpatient Hospital Stay (HOSPITAL_COMMUNITY): Payer: Medicaid Other

## 2017-03-24 DIAGNOSIS — R590 Localized enlarged lymph nodes: Secondary | ICD-10-CM

## 2017-03-24 DIAGNOSIS — J9601 Acute respiratory failure with hypoxia: Secondary | ICD-10-CM

## 2017-03-24 LAB — BASIC METABOLIC PANEL
ANION GAP: 10 (ref 5–15)
BUN: 5 mg/dL — AB (ref 6–20)
CO2: 27 mmol/L (ref 22–32)
Calcium: 9.2 mg/dL (ref 8.9–10.3)
Chloride: 99 mmol/L — ABNORMAL LOW (ref 101–111)
Creatinine, Ser: 0.73 mg/dL (ref 0.44–1.00)
GFR calc Af Amer: >60 mL/min (ref >60)
GLUCOSE: 147 mg/dL — AB (ref 65–99)
POTASSIUM: 4 mmol/L (ref 3.5–5.1)
Sodium: 136 mmol/L (ref 135–145)

## 2017-03-24 LAB — CBC
HEMATOCRIT: 52.4 % — AB (ref 36.0–46.0)
HEMOGLOBIN: 15.6 g/dL — AB (ref 12.0–15.0)
MCH: 25.1 pg — AB (ref 26.0–34.0)
MCHC: 29.8 g/dL — AB (ref 30.0–36.0)
MCV: 84.4 fL (ref 78.0–100.0)
Platelets: 223 10*3/uL (ref 150–400)
RBC: 6.21 MIL/uL — ABNORMAL HIGH (ref 3.87–5.11)
RDW: 16.3 % — AB (ref 11.5–15.5)
WBC: 10.5 10*3/uL (ref 4.0–10.5)

## 2017-03-24 LAB — TROPONIN I: Troponin I: <0.03 ng/mL (ref <0.03)

## 2017-03-24 LAB — GLUCOSE, CAPILLARY: GLUCOSE-CAPILLARY: 171 mg/dL — AB (ref 65–99)

## 2017-03-24 LAB — MAGNESIUM: MAGNESIUM: 2 mg/dL (ref 1.7–2.4)

## 2017-03-24 LAB — TSH: TSH: 0.533 u[IU]/mL (ref 0.350–4.500)

## 2017-03-24 LAB — HIV ANTIBODY (ROUTINE TESTING W REFLEX): HIV SCREEN 4TH GENERATION: NONREACTIVE

## 2017-03-24 MED ORDER — BUDESONIDE 0.5 MG/2ML IN SUSP
0.5000 mg | Freq: Two times a day (BID) | RESPIRATORY_TRACT | Status: DC
  Administered 2017-03-24 – 2017-03-26 (×4): 0.5 mg via RESPIRATORY_TRACT
  Filled 2017-03-24 (×4): qty 2

## 2017-03-24 MED ORDER — IPRATROPIUM-ALBUTEROL 0.5-2.5 (3) MG/3ML IN SOLN
3.0000 mL | Freq: Four times a day (QID) | RESPIRATORY_TRACT | Status: DC
  Administered 2017-03-24 – 2017-03-26 (×8): 3 mL via RESPIRATORY_TRACT
  Filled 2017-03-24 (×8): qty 3

## 2017-03-24 NOTE — Consult Note (Signed)
Name: Diane Gilbert MRN: [id number] DOB: 17-Sep-1965    ADMISSION DATE:  03/23/2017 CONSULTATION DATE:  03/24/2017  REFERRING MD :  Dr. Wendee Beavers  CHIEF COMPLAINT:  Dyspnea  HISTORY OF PRESENT ILLNESS:   51 year old female with PMH as below, significant for but not limited to respiratory bronchiolitis on continuous home O2 at 2L,current tobacco abuse, dHF, and alcohol abuse who presented with intermittently productive cough (white/yellowish), exertional dyspnea for several weeks, increasing lower extremity edema, and swelling to the right side of her face for three days (she relates to her right lower tooth pain).  She is followed by Dr. Lake Bells for respiratory bronchiolitis/ ILD.  She was seen in our office 9/25 for acute visit, found to have room air sats 79%, improved with O2, even though she is supposed to be on continuous oxygen therapy at 2L [place].  She has not been taking her Symbicort and continues to smoke, 1 to 2 cigarettes per day, but trying to quit.    In the ER, she had an I/D in the ER for possible tooth abscess.  CXR showed possible inflitrates therefore CT chest done which showed worsening bilateral hilar lymphadenopathy. She was afebrile, WBC 10.5.  She was empirically started on antibotics for CAP and started on lasix.  Admitted to [facility].  Pulmonary consulted for changes in chest CT.  PAST MEDICAL HISTORY :   has a past medical history of Alcohol abuse; Anxiety; Arthritis; Depression; Headache(784.0); Hypertension; Insomnia; Interstitial lung disease ([place]); Oxygen dependent; Schizophrenia ([place]); Shortness of breath dyspnea; Sleep apnea; and Sleep apnea.  has a past surgical history that includes Back surgery; Video bronchoscopy (Bilateral, 04/11/2014); Multiple tooth extractions; Video bronchoscopy (N/A, 07/16/2014); Video assisted thoracoscopy (Left, 07/16/2014); Lung biopsy (Left, 07/16/2014); and Wedge resection (Left, 07/16/2014). Prior to Admission medications   Medication Sig  Start Date End Date Taking? Authorizing Provider  albuterol (PROVENTIL HFA;VENTOLIN HFA) 108 (90 BASE) MCG/ACT inhaler Inhale 2 puffs into the lungs every 6 (six) hours as needed for wheezing or shortness of breath. 05/09/15  Yes Juanito Doom, MD  albuterol (PROVENTIL) (2.5 MG/3ML) 0.083% nebulizer solution Take 2.5 mg by nebulization every 6 (six) hours as needed for wheezing or shortness of breath.   Yes [provider]  ARIPiprazole (ABILIFY) 20 MG tablet Take 20 mg by mouth daily.   Yes [provider]  buPROPion (WELLBUTRIN SR) 150 MG 12 hr tablet Take 150 mg by mouth See admin instructions. Take 1 tablet (150 mg) by mouth every morning and at noon   Yes [provider]  gabapentin (NEURONTIN) 300 MG capsule Take 300-600 mg by mouth See admin instructions. 1 capsule three times daily, with 2 at bedtime   Yes [provider]  oxyCODONE-acetaminophen (PERCOCET/ROXICET) 5-325 MG tablet Take 1 tablet by mouth every 6 (six) hours as needed for severe pain.   Yes [provider]  OXYGEN Inhale 2 L into the lungs continuous.   Yes [provider]  traZODone (DESYREL) 100 MG tablet Take 100 mg by mouth at bedtime.    Yes [provider]  budesonide-formoterol (SYMBICORT) 160-4.5 MCG/ACT inhaler Inhale 2 puffs into the lungs 2 (two) times daily. Patient not taking: Reported on 03/23/2017 09/01/16   Juanito Doom, MD  budesonide-formoterol [facility]) 160-4.5 MCG/ACT inhaler Inhale 2 puffs into the lungs 2 (two) times daily. Patient not taking: Reported on 03/23/2017 03/23/17   Parrett, Fonnie Mu, NP  furosemide (LASIX) 40 MG tablet Take 1 tablet (40 mg  total) by mouth daily. Patient not taking: Reported on 03/23/2017 11/04/16 11/07/16  Kalman Shan Ratliff, DO  guaiFENesin (MUCINEX) 600 MG 12 hr tablet Take 1 tablet (600 mg total) by mouth 2 (two) times daily. Patient not taking: Reported on 03/23/2017 07/07/16   Mendel Corning, MD   No  Known Allergies  FAMILY HISTORY:  family history includes Breast cancer in her mother; COPD in her maternal aunt. SOCIAL HISTORY:  reports that she has been smoking Cigarettes.  She has a 45.00 pack-year smoking history. She has never used smokeless tobacco. She reports that she drinks alcohol. She reports that she does not use drugs.  REVIEW OF SYSTEMS:   Constitutional: Negative for fever, chills, weight loss, malaise/fatigue and diaphoresis.  HENT: Negative for hearing loss, ear pain, nosebleeds, congestion, sore throat, neck pain, tinnitus and ear discharge. Facial swelling, right lower tooth pain  Eyes: Negative for blurred vision, double vision, photophobia, pain, discharge and redness.  Respiratory: Negative for productive cough, hemoptysis, sputum production, extertional shortness of breath, wheezing and stridor.   Cardiovascular: Negative for chest pain, palpitations, orthopnea, claudication, leg swelling and PND.  Gastrointestinal: Negative for heartburn, nausea, vomiting, abdominal pain, diarrhea, constipation, blood in stool and melena.  Genitourinary: Negative for dysuria, urgency, frequency, hematuria and flank pain.  Musculoskeletal: Negative for myalgias, back pain, joint pain and falls.  Skin: Negative for itching and rash.  Neurological: Negative for dizziness, tingling, tremors, sensory change, speech change, focal weakness, seizures, loss of consciousness, weakness and headaches.  Endo/Heme/Allergies: Negative for environmental allergies and polydipsia. Does not bruise/bleed easily.  SUBJECTIVE:  On 2L [place]  VITAL SIGNS: Temp:  [98 F (36.7 C)-98.8 F (37.1 C)] 98.4 F (36.9 C) (09/26 1129) Pulse Rate:  [80-93] 80 (09/26 1129) Resp:  [18-28] 18 (09/26 1129) BP: (112-189)/(63-99) 132/64 (09/26 1129) SpO2:  [90 %-96 %] 94 % (09/26 1129) Weight:  [257 lb 8 oz (116.8 kg)-259 lb (117.5 kg)] 257 lb 8 oz (116.8 kg) (09/26 0500)  PHYSICAL EXAMINATION: General:  Well  nourished adult female sitting in bed in NAD HEENT: MM pink/moist, mild right facial swelling Neuro: AOx3, non-focal CV: rrr PULM: even/non-labored, lungs bilaterally clear, no wheeze GI: obese, soft, bs active  Extremities: warm/dry, trace BLE edema  Skin: no rashes  Recent Labs Lab 03/23/17 1842 03/24/17 0237  NA 137 136  K 4.2 4.0  CL 101 99*  CO2 31 27  BUN 6 5*  CREATININE 0.72 0.73  GLUCOSE 103* 147*    Recent Labs Lab 03/23/17 1842 03/24/17 0237  HGB 15.7* 15.6*  HCT 51.5* 52.4*  WBC 12.5* 10.5  PLT 195 223   Dg Chest 2 View  Result Date: 03/23/2017 CLINICAL DATA:  Shortness of breath cough and chest pain EXAM: CHEST  2 VIEW COMPARISON:  September 01, 2016 FINDINGS: There is underlying reticulonodular interstitial disease, stable. : There is soft tissue prominence in the right perihilar region. It is difficult to ascertain whether this area represents consolidation versus a mass/ adenopathy in the perihilar region. There is cardiomegaly with pulmonary venous hypertension. No bone lesions are evident. Beyond potential right perihilar adenopathy, no other areas suggest adenopathy by radiography. IMPRESSION: 1. Question infiltrate right perihilar region versus mass/adenopathy. This area warrants correlation with contrast enhanced chest CT to further evaluate. 2.  Stable underlying reticulonodular interstitial disease. 3. Pulmonary vascular congestion with cardiomegaly and pulmonary venous hypertension. Electronically Signed   By: Lowella Grip III M.D.   On: 03/23/2017 17:31   Ct Chest  W Contrast  Result Date: 03/23/2017 CLINICAL DATA:  Chest pain shortness of breath EXAM: CT CHEST WITH CONTRAST TECHNIQUE: Multidetector CT imaging of the chest was performed during intravenous contrast administration. CONTRAST:  93m ISOVUE-300 IOPAMIDOL (ISOVUE-300) INJECTION 61% COMPARISON:  Radiograph 03/23/2017, CT chest 03/24/2016, 01/22/2014 FINDINGS: Cardiovascular: Suboptimal  opacification. nonaneurysmal aorta. Minimal atherosclerotic calcification. Mild cardiomegaly. Small pericardial effusion. Mediastinum/Nodes: Midline trachea. No thyroid mass. Similar appearance of scattered mediastinal lymph nodes. Vague low-density foci within the right greater than left pulmonary hila, consistent with adenopathy, slight increased compared to the prior chest CT. Lungs/Pleura: Mild to moderate emphysema. Diffuse ground-glass density, corresponding to the patient's history of underlying interstitial disease. Overall improved pattern compared with the CT from 2017. No acute consolidations or pleural effusions. Upper Abdomen: No acute abnormality. Musculoskeletal: No acute or suspicious bone lesion. IMPRESSION: 1. Increased radiographic hilar opacity appears to correspond to enlarged right greater than left hilar lymph nodes which have increased compared to the prior CT from 2017. Mild mediastinal nodes otherwise appear unchanged. 2. Mild to moderate emphysema. Diffuse subtle ground-glass density, felt to correspond to patient's history of underlying interstitial lung disease. There are no acute consolidations or pleural effusion seen. Aortic Atherosclerosis (ICD10-I70.0) and Emphysema (ICD10-J43.9). Electronically Signed   By: KDonavan FoilM.D.   On: 03/23/2017 23:11   Ct Maxillofacial Wo Contrast  Result Date: 03/24/2017 CLINICAL DATA:  Soft tissue swelling and redness over face. Soft tissue prominence right lower and mandibular region with pain EXAM: CT MAXILLOFACIAL WITHOUT CONTRAST TECHNIQUE: Multidetector CT imaging of the maxillofacial structures was performed. Multiplanar CT image reconstructions were also generated. COMPARISON:  Nov 01, 2011 FINDINGS: Osseous: There is no demonstrable fracture or dislocation. No erosive change or bony destruction. Orbits: As was noted on 2013 study, globes appear somewhat proptotic bilaterally, a stable finding. Extra-ocular muscles do not appear enlarged.  No intraorbital lesions are appreciable on this study. Sinuses: There is opacification and mucosal thickening involving multiple ethmoid air cells. 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 There is opacification throughout much of a somewhat hypoplastic left sphenoid sinus. There is mild rightward deviation of the nasal septum. There is extensive opacification with likely polypoid change in the right nasal cavity with right nasal cavity obstruction. The left nasal cavity is patent. Ostiomeatal unit complexes are patent bilaterally, although there is narrowing of the ostiomeatal unit complex on the right due to localized soft tissue edema. Soft tissues: There is soft tissue thickening and induration adjacent to the anterior right mandible without well-defined mass or abscess. There is soft tissue swelling over each lower to mid face region with skin thickening but no abscess or evidence of hematoma. There is very little stranding in the fat in these areas. There are multiple lymph nodes inferior to the mandible, largest measuring 1.0 x 0.8 cm. No frank adenopathy is evident by size criteria in this area. No adenopathy seen elsewhere. Tongue and tongue base regions appear normal. Visualized pharynx appears unremarkable. There is osteoarthritic change in the mid cervical spine. Limited intracranial: Mastoid air cells are clear. Visualized intracranial regions appear unremarkable. IMPRESSION: 1. Soft tissue prominence with mild induration adjacent to the right mandible anteriorly consistent with inflammation. No frank abscess or fluid noted in this area. Adjacent bone appears normal. 2. Soft tissue swelling along each lower to mid face without hematoma or abscess. No significant stranding in the fat in these areas. 3. Several lymph nodes inferior to the mandible may have inflammatory etiology. No frank adenopathy by size criteria. 4. Foci  of paranasal sinus disease as described. Note that there is obstruction of the right nasal  cavity due to edema and likely underlying polypoid change. Ostiomeatal unit complexes are patent bilaterally, although there is narrowing of the ostiomeatal unit complex on the right due to edema. 5.  No bony destruction or erosion.  No fracture or dislocation. Electronically Signed   By: Lowella Grip III M.D.   On: 03/24/2017 08:56   SIGNIFICANT EVENTS  9/25 Admit  STUDIES:  May 2018 PFT>> ratio 87%, FEV1 1.18 L 50% predicted, FVC 1.36 L 46% predicted, total lung capacity 3.02 L 59% predicted, DLCO 12.65 mL per Min per millimeters of mercury 52% predicted  Path >> Bronch 03/2014: BAL with 85 WBC's, 21% eos. TBBX with normal lung parenchyma, but not enough for possible specific diagnosis VATS BX 06/2014: RB-ILD (severe), ??? Component NSIP included in the differential  HP panel negative 03/2014 Autoimmune labs normal 07/2014  6MW March 2017 6 minute walk distance 144 m, O2 saturation nadir 81% on room air April 2018 6 minute walk distance 144 m, O2 saturation nadir 87% on room air  CXR 9/25 >> ? inflitrate right perihilar region versus mass/adenopathy; stable underlying reticulonodular interstitial disease; pulmonary vascular congestion w/cardiomegaly and pulmonary venous hypertension.  CT Chest 9/25 >> Increased radiographic hilar opacity appears to correspond to enlarged right greater than left hilar lymph nodes which have increased compared to the prior CT from 2017. Mild mediastinal nodes otherwise appear unchanged.  Mild to moderate emphysema. Diffuse subtle ground-glass density, felt to correspond to patient's history of underlying interstitial lung disease. There are no acute consolidations or pleural effusion Seen.  Aortic atherosclerosis and emphysema.  CT maxillofacial 9/26 >> mild induration adjacent to the right mandible anteriorly c/w inflammation; no frank abscess; soft tissue swelling along lower to mid face w/o hematoma or abscess; Several lymph nodes inferior to the  mandible may have inflammatory etiology. No frank adenopathy by size criteria; Foci of paranasal sinus disease w/ obstruction of right nasal cavity d/t edema  ASSESSMENT / PLAN:  Bilateral hilar adenopathy, right greater than left Acute on chronic hypoxic respiratory failure Respiratory bronchiolitis- ILD Acute bronchitis- cxr neg for focal opacity Tobacco abuse  - acute exacerbation likely multifactorial- due to volume overload in the setting of diastolic HF, ongoing tobacco abuse, ? non-compliance with O2 therapy and Symbicort - hilar adenopathy is likely reactive.  Given that patient is back at her baseline pulmonary status, recommend watching for now with follow-up in office as scheduled in November.  P:  Continue supplemental O2 therapy for sats >90%, baseline 2L [place] Consider switching abx to Levaquin x 5 days Solumedrol 60m daily, with taper (no wheezing on current exam) Continue diuresis w/ lasix as per primary  Continue duonebs q4 hr and prn q2h, pulmicort nebs (dose adjusted) and d/c on symbicort Patient has follow-up appt in Pulmonary office in November w/Dr. MLake BellsOngoing smoking cessation counseling    Remainder per primary  BKennieth Rad ACNP Pulmonary and CNorthwest HarwintonPager: [phone] 03/24/2017, 12:41 PM

## 2017-03-24 NOTE — Progress Notes (Signed)
NURSING PROGRESS NOTE  Diane Gilbert [id number] Admission Data: 03/24/2017 7:29 AM Attending Provider: Velvet Bathe, MD [id number], Christean Grief, MD Code Status: Full   Diane Gilbert is a 51 y.o. female patient admitted from ED:  -No acute distress noted.  -No complaints of shortness of breath.  -No complaints of chest pain.   Cardiac Monitoring: Box #20 in place. Cardiac monitor yields:normal sinus rhythm.  Blood pressure (!) 144/63, pulse 85, temperature 98 F (36.7 C), temperature source Oral, resp. rate 20, height _0  (1.626 m), weight 116.8 kg (257 lb 8 oz), SpO2 96 %.   IV Fluids:  IV in place, occlusive dsg intact without redness, IV cath hand left, condition patent and no redness none.   Allergies:  Patient has no known allergies.  Past Medical History:   has a past medical history of Alcohol abuse; Anxiety; Arthritis; Depression; Headache(784.0); Hypertension; Insomnia; Interstitial lung disease ([place]); Oxygen dependent; Schizophrenia ([place]); Shortness of breath dyspnea; Sleep apnea; and Sleep apnea.  Past Surgical History:   has a past surgical history that includes Back surgery; Video bronchoscopy (Bilateral, 04/11/2014); Multiple tooth extractions; Video bronchoscopy (N/A, 07/16/2014); Video assisted thoracoscopy (Left, 07/16/2014); Lung biopsy (Left, 07/16/2014); and Wedge resection (Left, 07/16/2014).  Social History:   reports that she has been smoking Cigarettes.  She has a 45.00 pack-year smoking history. She has never used smokeless tobacco. She reports that she drinks alcohol. She reports that she does not use drugs.  Skin: Clean and Intact. Extremely dry  Patient/Family orientated to room. Information packet given to patient/family. Admission inpatient armband information verified with patient/family to include name and date of birth and placed on patient arm. Side rails up x 2, fall assessment and education completed with patient/family. Patient/family able  to verbalize understanding of risk associated with falls and verbalized understanding to call for assistance before getting out of bed. Call light within reach. Patient/family able to voice and demonstrate understanding of unit orientation instructions.    Patient have prepackaged medications that she refused to send to pharmacy or to the safe. She was educated on the importance of not taking them and sending them home with family. She said she would send them when family comes to visit. Will let oncoming RN know.  Will continue to evaluate and treat per MD orders.

## 2017-03-24 NOTE — Progress Notes (Signed)
Made Sabrina oncoming RN aware that patient's meds are in closet in patient's room.

## 2017-03-24 NOTE — Progress Notes (Signed)
PROGRESS NOTE    Diane Gilbert  EHM:[id number] DOB: 1966/05/24 DOA: 03/23/2017 PCP: Nolene Ebbs, MD    Brief Narrative:  51 y.o. female with history of COPD tobacco abuse CHF interstitial lung disease alcohol abuse and ongoing tobacco abuse presents to the ER because of shortness of breath and right facial swelling. Patient states over the last few weeks patient has been getting increasingly short of breath mostly on exertion. Denies any chest pain. Denies any fever or chills but has been having productive cough. Patient also has been having increasing lower extremity edema. In addition patient over the last 3 days has noticed swelling of the right side of the face and mostly related to her tooth in the lower right jaw. Patient had gone to her PCP and over there on exam patient also had some right facial swelling. Patient is not able to exactly say how long she been having this swelling. Denies any difficulty swallowing. Denies any new medications.   Assessment & Plan:   1. Acute respiratory failure with hypoxia - Etiology copd vs chf. Continue current medical regimen. 2. Bilateral worsening hilar lymphadenopathy: consulted pulmonology.  3. Right facial swelling with possible tooth abscess status post I and D by the ER physician - continue rocephin and azithromycin. Most likely transition to clindamycin next am. 4. Diastolic dysfunction per 2-D echodone in October 2017 - EF was 65-70% with grade 2 diastolic dysfunction. Place patient on Lasix for now. Closely follow intake output metabolic panel and daily weights. 5. Interstitial lung disease - continue current antibiotic regimen. 6. Alcohol abuse - patient placed on  CIWA.  Advised to quit drinking.  DVT prophylaxis: Lovenox Code Status: Full Family Communication: none at bedside. Disposition Plan: pending improvement in condition.  Consultants:   Critical care   Procedures: None   Antimicrobials: Azithromycin,  Rocephin   Subjective: Pt has no new complaints. No acute issues reported overnight. Still has facial swelling but not worse.   Objective: Vitals:   03/24/17 0500 03/24/17 0747 03/24/17 0944 03/24/17 1129  BP:  (!) 141/84  132/64  Pulse:  82  80  Resp:  18  18  Temp:  98.2 F (36.8 C)  98.4 F (36.9 C)  TempSrc:  Oral  Oral  SpO2:  96% 90% 94%  Weight: 116.8 kg (257 lb 8 oz)     Height:        Intake/Output Summary (Last 24 hours) at 03/24/17 1445 Last data filed at 03/24/17 1401  Gross per 24 hour  Intake              645 ml  Output             2550 ml  Net            -1905 ml   Filed Weights   03/24/17 0000 03/24/17 0500  Weight: 116.8 kg (257 lb 8 oz) 116.8 kg (257 lb 8 oz)    Examination:  General exam: Appears calm and comfortable  Respiratory system: equal chest rise, no wheezes, Decreased breath sounds at bases. Cardiovascular system: S1 & S2 heard, RRR. No JVD, murmurs, rubs, gallops or clicks. No pedal edema. Gastrointestinal system: Abdomen is nondistended, soft and nontender. No organomegaly or masses felt. Normal bowel sounds heard. Central nervous system: Alert and oriented. No focal neurological deficits. Extremities: Symmetric 5 x 5 power. Skin: No rashes, lesions or ulcers, right facial swelling at right mandible Psychiatry: Judgement and insight appear normal. Mood & affect appropriate.  Data Reviewed: I have personally reviewed following labs and imaging studies  CBC:  Recent Labs Lab 03/23/17 1842 03/24/17 0237  WBC 12.5* 10.5  NEUTROABS 4.9  --   HGB 15.7* 15.6*  HCT 51.5* 52.4*  MCV 83.5 84.4  PLT 195 681   Basic Metabolic Panel:  Recent Labs Lab 03/23/17 1842 03/24/17 0237  NA 137 136  K 4.2 4.0  CL 101 99*  CO2 31 27  GLUCOSE 103* 147*  BUN 6 5*  CREATININE 0.72 0.73  CALCIUM 9.2 9.2  MG  --  2.0   GFR: Estimated Creatinine Clearance: 107.9 mL/min (by C-G formula based on SCr of 0.73 mg/dL). Liver Function  Tests:  Recent Labs Lab 03/23/17 1842  AST 37  ALT 45  ALKPHOS 75  BILITOT 0.7  PROT 7.2  ALBUMIN 3.6   No results for input(s): LIPASE, AMYLASE in the last 168 hours. No results for input(s): AMMONIA in the last 168 hours. Coagulation Profile: No results for input(s): INR, PROTIME in the last 168 hours. Cardiac Enzymes:  Recent Labs Lab 03/24/17 0237 03/24/17 1047  TROPONINI <0.03 <0.03   BNP (last 3 results) No results for input(s): PROBNP in the last 8760 hours. HbA1C: No results for input(s): HGBA1C in the last 72 hours. CBG:  Recent Labs Lab 03/24/17 1101  GLUCAP 171*   Lipid Profile: No results for input(s): CHOL, HDL, LDLCALC, TRIG, CHOLHDL, LDLDIRECT in the last 72 hours. Thyroid Function Tests:  Recent Labs  03/24/17 0237  TSH 0.533   Anemia Panel: No results for input(s): VITAMINB12, FOLATE, FERRITIN, TIBC, IRON, RETICCTPCT in the last 72 hours. Sepsis Labs:  Recent Labs Lab 03/23/17 1849  LATICACIDVEN 0.86    No results found for this or any previous visit (from the past 240 hour(s)).       Radiology Studies: Dg Chest 2 View  Result Date: 03/23/2017 CLINICAL DATA:  Shortness of breath cough and chest pain EXAM: CHEST  2 VIEW COMPARISON:  September 01, 2016 FINDINGS: There is underlying reticulonodular interstitial disease, stable. : There is soft tissue prominence in the right perihilar region. It is difficult to ascertain whether this area represents consolidation versus a mass/ adenopathy in the perihilar region. There is cardiomegaly with pulmonary venous hypertension. No bone lesions are evident. Beyond potential right perihilar adenopathy, no other areas suggest adenopathy by radiography. IMPRESSION: 1. Question infiltrate right perihilar region versus mass/adenopathy. This area warrants correlation with contrast enhanced chest CT to further evaluate. 2.  Stable underlying reticulonodular interstitial disease. 3. Pulmonary vascular congestion  with cardiomegaly and pulmonary venous hypertension. Electronically Signed   By: Lowella Grip III M.D.   On: 03/23/2017 17:31   Ct Chest W Contrast  Result Date: 03/23/2017 CLINICAL DATA:  Chest pain shortness of breath EXAM: CT CHEST WITH CONTRAST TECHNIQUE: Multidetector CT imaging of the chest was performed during intravenous contrast administration. CONTRAST:  3m ISOVUE-300 IOPAMIDOL (ISOVUE-300) INJECTION 61% COMPARISON:  Radiograph 03/23/2017, CT chest 03/24/2016, 01/22/2014 FINDINGS: Cardiovascular: Suboptimal opacification. nonaneurysmal aorta. Minimal atherosclerotic calcification. Mild cardiomegaly. Small pericardial effusion. Mediastinum/Nodes: Midline trachea. No thyroid mass. Similar appearance of scattered mediastinal lymph nodes. Vague low-density foci within the right greater than left pulmonary hila, consistent with adenopathy, slight increased compared to the prior chest CT. Lungs/Pleura: Mild to moderate emphysema. Diffuse ground-glass density, corresponding to the patient's history of underlying interstitial disease. Overall improved pattern compared with the CT from 2017. No acute consolidations or pleural effusions. Upper Abdomen: No acute abnormality. Musculoskeletal: No acute  or suspicious bone lesion. IMPRESSION: 1. Increased radiographic hilar opacity appears to correspond to enlarged right greater than left hilar lymph nodes which have increased compared to the prior CT from 2017. Mild mediastinal nodes otherwise appear unchanged. 2. Mild to moderate emphysema. Diffuse subtle ground-glass density, felt to correspond to patient's history of underlying interstitial lung disease. There are no acute consolidations or pleural effusion seen. Aortic Atherosclerosis (ICD10-I70.0) and Emphysema (ICD10-J43.9). Electronically Signed   By: Donavan Foil M.D.   On: 03/23/2017 23:11   Ct Maxillofacial Wo Contrast  Result Date: 03/24/2017 CLINICAL DATA:  Soft tissue swelling and redness  over face. Soft tissue prominence right lower and mandibular region with pain EXAM: CT MAXILLOFACIAL WITHOUT CONTRAST TECHNIQUE: Multidetector CT imaging of the maxillofacial structures was performed. Multiplanar CT image reconstructions were also generated. COMPARISON:  Nov 01, 2011 FINDINGS: Osseous: There is no demonstrable fracture or dislocation. No erosive change or bony destruction. Orbits: As was noted on 2013 study, globes appear somewhat proptotic bilaterally, a stable finding. Extra-ocular muscles do not appear enlarged. No intraorbital lesions are appreciable on this study. Sinuses: There is opacification and mucosal thickening involving multiple ethmoid air cells. 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 There is opacification throughout much of a somewhat hypoplastic left sphenoid sinus. There is mild rightward deviation of the nasal septum. There is extensive opacification with likely polypoid change in the right nasal cavity with right nasal cavity obstruction. The left nasal cavity is patent. Ostiomeatal unit complexes are patent bilaterally, although there is narrowing of the ostiomeatal unit complex on the right due to localized soft tissue edema. Soft tissues: There is soft tissue thickening and induration adjacent to the anterior right mandible without well-defined mass or abscess. There is soft tissue swelling over each lower to mid face region with skin thickening but no abscess or evidence of hematoma. There is very little stranding in the fat in these areas. There are multiple lymph nodes inferior to the mandible, largest measuring 1.0 x 0.8 cm. No frank adenopathy is evident by size criteria in this area. No adenopathy seen elsewhere. Tongue and tongue base regions appear normal. Visualized pharynx appears unremarkable. There is osteoarthritic change in the mid cervical spine. Limited intracranial: Mastoid air cells are clear. Visualized intracranial regions appear unremarkable. IMPRESSION: 1. Soft  tissue prominence with mild induration adjacent to the right mandible anteriorly consistent with inflammation. No frank abscess or fluid noted in this area. Adjacent bone appears normal. 2. Soft tissue swelling along each lower to mid face without hematoma or abscess. No significant stranding in the fat in these areas. 3. Several lymph nodes inferior to the mandible may have inflammatory etiology. No frank adenopathy by size criteria. 4. Foci of paranasal sinus disease as described. Note that there is obstruction of the right nasal cavity due to edema and likely underlying polypoid change. Ostiomeatal unit complexes are patent bilaterally, although there is narrowing of the ostiomeatal unit complex on the right due to edema. 5.  No bony destruction or erosion.  No fracture or dislocation. Electronically Signed   By: Lowella Grip III M.D.   On: 03/24/2017 08:56    Scheduled Meds: . ARIPiprazole  20 mg Oral Daily  . budesonide (PULMICORT) nebulizer solution  0.25 mg Nebulization BID  . buPROPion  150 mg Oral BID  . enoxaparin (LOVENOX) injection  60 mg Subcutaneous Q24H  . folic acid  1 mg Oral Daily  . furosemide  40 mg Intravenous Daily  . gabapentin  300 mg Oral  TID WC  . gabapentin  600 mg Oral QHS  . ipratropium-albuterol  3 mL Nebulization QID  . LORazepam  0-4 mg Oral Q6H   Followed by  . [START ON 03/26/2017] LORazepam  0-4 mg Oral Q12H  . methylPREDNISolone (SOLU-MEDROL) injection  40 mg Intravenous Daily  . multivitamin with minerals  1 tablet Oral Daily  . thiamine  100 mg Oral Daily   Or  . thiamine  100 mg Intravenous Daily  . traZODone  100 mg Oral QHS   Continuous Infusions: . azithromycin    . cefTRIAXone (ROCEPHIN)  IV       LOS: 1 day    Time spent: > 35 minutes  Velvet Bathe, MD [facility] Pager [phone]  If 7PM-7AM, please contact night-coverage [email or web address] Password [email or web address] 03/24/2017, 2:45 PM

## 2017-03-25 ENCOUNTER — Ambulatory Visit: Payer: Medicaid Other | Admitting: Obstetrics & Gynecology

## 2017-03-25 LAB — STREP PNEUMONIAE URINARY ANTIGEN: STREP PNEUMO URINARY ANTIGEN: NEGATIVE

## 2017-03-25 MED ORDER — HYDROCODONE-ACETAMINOPHEN 5-325 MG PO TABS
1.0000 | ORAL_TABLET | ORAL | Status: DC | PRN
  Administered 2017-03-25 – 2017-03-26 (×4): 2 via ORAL
  Filled 2017-03-25 (×4): qty 2

## 2017-03-25 NOTE — Progress Notes (Signed)
PROGRESS NOTE    Diane Gilbert  LMB:[id number] DOB: 29-Oct-1965 DOA: 03/23/2017 PCP: Nolene Ebbs, MD    Brief Narrative:  51 y.o. female with history of COPD tobacco abuse CHF interstitial lung disease alcohol abuse and ongoing tobacco abuse presents to the ER because of shortness of breath and right facial swelling. Patient states over the last few weeks patient has been getting increasingly short of breath mostly on exertion. Denies any chest pain. Denies any fever or chills but has been having productive cough. Patient also has been having increasing lower extremity edema. In addition patient over the last 3 days has noticed swelling of the right side of the face and mostly related to her tooth in the lower right jaw. Patient had gone to her PCP and over there on exam patient also had some right facial swelling. Patient is not able to exactly say how long she been having this swelling. Denies any difficulty swallowing. Denies any new medications.   Assessment & Plan:   1. Acute respiratory failure with hypoxia - Etiology copd vs chf. Continue current antibiotic regimen. 2. Bilateral worsening hilar lymphadenopathy: consulted pulmonology.  3. Right facial swelling with possible tooth abscess status post I and D by the ER physician - continue rocephin and azithromycin. Most likely transition to clindamycin soon.  4. Diastolic dysfunction per 2-D echodone in October 2017 - EF was 65-70% with grade 2 diastolic dysfunction. Place patient on Lasix for now. Closely follow intake output metabolic panel and daily weights. 5. Interstitial lung disease - Critical care team consulted, note reviewed. Plan is for continue outpatient evaluation and sleep study 6. Alcohol abuse - patient placed on CIWA. No withdrawal symptoms noted on exam.  DVT prophylaxis: Lovenox Code Status: Full Family Communication: none at bedside. Disposition Plan: pending improvement in condition.  Consultants:    Critical care   Procedures: None   Antimicrobials: Azithromycin, Rocephin   Subjective: Pt has no new complaints. No acute issues reported overnight. Still has facial swelling but not worse.   Objective: Vitals:   03/25/17 0558 03/25/17 0848 03/25/17 1027 03/25/17 1227  BP: (!) 154/78  116/72   Pulse: 74  75   Resp:      Temp: 98.2 F (36.8 C)     TempSrc: Oral     SpO2: 93% 98% 95% 96%  Weight: 113.9 kg (251 lb 3.2 oz)     Height:        Intake/Output Summary (Last 24 hours) at 03/25/17 1538 Last data filed at 03/25/17 1117  Gross per 24 hour  Intake              772 ml  Output             2950 ml  Net            -2178 ml   Filed Weights   03/24/17 0000 03/24/17 0500 03/25/17 0558  Weight: 116.8 kg (257 lb 8 oz) 116.8 kg (257 lb 8 oz) 113.9 kg (251 lb 3.2 oz)    Examination:  General exam: Appears calm and comfortable, pt in nad.  Respiratory system: equal chest rise, no wheezes, Decreased breath sounds at bases. Cardiovascular system: S1 & S2 heard, RRR. No JVD, murmurs, rubs, gallops or clicks. No pedal edema. Gastrointestinal system: Abdomen is nondistended, soft and nontender. No organomegaly or masses felt. Normal bowel sounds heard. Central nervous system: Alert and oriented. No focal neurological deficits. Extremities: Symmetric 5 x 5 power. Skin: No rashes,  lesions or ulcers, right facial swelling at right mandible Psychiatry: Judgement and insight appear normal. Mood & affect appropriate.     Data Reviewed: I have personally reviewed following labs and imaging studies  CBC:  Recent Labs Lab 03/23/17 1842 03/24/17 0237  WBC 12.5* 10.5  NEUTROABS 4.9  --   HGB 15.7* 15.6*  HCT 51.5* 52.4*  MCV 83.5 84.4  PLT 195 480   Basic Metabolic Panel:  Recent Labs Lab 03/23/17 1842 03/24/17 0237  NA 137 136  K 4.2 4.0  CL 101 99*  CO2 31 27  GLUCOSE 103* 147*  BUN 6 5*  CREATININE 0.72 0.73  CALCIUM 9.2 9.2  MG  --  2.0    GFR: Estimated Creatinine Clearance: 106.4 mL/min (by C-G formula based on SCr of 0.73 mg/dL). Liver Function Tests:  Recent Labs Lab 03/23/17 1842  AST 37  ALT 45  ALKPHOS 75  BILITOT 0.7  PROT 7.2  ALBUMIN 3.6   No results for input(s): LIPASE, AMYLASE in the last 168 hours. No results for input(s): AMMONIA in the last 168 hours. Coagulation Profile: No results for input(s): INR, PROTIME in the last 168 hours. Cardiac Enzymes:  Recent Labs Lab 03/24/17 0237 03/24/17 1047  TROPONINI <0.03 <0.03   BNP (last 3 results) No results for input(s): PROBNP in the last 8760 hours. HbA1C: No results for input(s): HGBA1C in the last 72 hours. CBG:  Recent Labs Lab 03/24/17 1101  GLUCAP 171*   Lipid Profile: No results for input(s): CHOL, HDL, LDLCALC, TRIG, CHOLHDL, LDLDIRECT in the last 72 hours. Thyroid Function Tests:  Recent Labs  03/24/17 0237  TSH 0.533   Anemia Panel: No results for input(s): VITAMINB12, FOLATE, FERRITIN, TIBC, IRON, RETICCTPCT in the last 72 hours. Sepsis Labs:  Recent Labs Lab 03/23/17 1849  LATICACIDVEN 0.86    No results found for this or any previous visit (from the past 240 hour(s)).       Radiology Studies: Dg Chest 2 View  Result Date: 03/23/2017 CLINICAL DATA:  Shortness of breath cough and chest pain EXAM: CHEST  2 VIEW COMPARISON:  September 01, 2016 FINDINGS: There is underlying reticulonodular interstitial disease, stable. : There is soft tissue prominence in the right perihilar region. It is difficult to ascertain whether this area represents consolidation versus a mass/ adenopathy in the perihilar region. There is cardiomegaly with pulmonary venous hypertension. No bone lesions are evident. Beyond potential right perihilar adenopathy, no other areas suggest adenopathy by radiography. IMPRESSION: 1. Question infiltrate right perihilar region versus mass/adenopathy. This area warrants correlation with contrast enhanced chest  CT to further evaluate. 2.  Stable underlying reticulonodular interstitial disease. 3. Pulmonary vascular congestion with cardiomegaly and pulmonary venous hypertension. Electronically Signed   By: Lowella Grip III M.D.   On: 03/23/2017 17:31   Ct Chest W Contrast  Result Date: 03/23/2017 CLINICAL DATA:  Chest pain shortness of breath EXAM: CT CHEST WITH CONTRAST TECHNIQUE: Multidetector CT imaging of the chest was performed during intravenous contrast administration. CONTRAST:  84m ISOVUE-300 IOPAMIDOL (ISOVUE-300) INJECTION 61% COMPARISON:  Radiograph 03/23/2017, CT chest 03/24/2016, 01/22/2014 FINDINGS: Cardiovascular: Suboptimal opacification. nonaneurysmal aorta. Minimal atherosclerotic calcification. Mild cardiomegaly. Small pericardial effusion. Mediastinum/Nodes: Midline trachea. No thyroid mass. Similar appearance of scattered mediastinal lymph nodes. Vague low-density foci within the right greater than left pulmonary hila, consistent with adenopathy, slight increased compared to the prior chest CT. Lungs/Pleura: Mild to moderate emphysema. Diffuse ground-glass density, corresponding to the patient's history of underlying interstitial disease.  Overall improved pattern compared with the CT from 2017. No acute consolidations or pleural effusions. Upper Abdomen: No acute abnormality. Musculoskeletal: No acute or suspicious bone lesion. IMPRESSION: 1. Increased radiographic hilar opacity appears to correspond to enlarged right greater than left hilar lymph nodes which have increased compared to the prior CT from 2017. Mild mediastinal nodes otherwise appear unchanged. 2. Mild to moderate emphysema. Diffuse subtle ground-glass density, felt to correspond to patient's history of underlying interstitial lung disease. There are no acute consolidations or pleural effusion seen. Aortic Atherosclerosis (ICD10-I70.0) and Emphysema (ICD10-J43.9). Electronically Signed   By: Donavan Foil M.D.   On: 03/23/2017  23:11   Ct Maxillofacial Wo Contrast  Result Date: 03/24/2017 CLINICAL DATA:  Soft tissue swelling and redness over face. Soft tissue prominence right lower and mandibular region with pain EXAM: CT MAXILLOFACIAL WITHOUT CONTRAST TECHNIQUE: Multidetector CT imaging of the maxillofacial structures was performed. Multiplanar CT image reconstructions were also generated. COMPARISON:  Nov 01, 2011 FINDINGS: Osseous: There is no demonstrable fracture or dislocation. No erosive change or bony destruction. Orbits: As was noted on 2013 study, globes appear somewhat proptotic bilaterally, a stable finding. Extra-ocular muscles do not appear enlarged. No intraorbital lesions are appreciable on this study. Sinuses: There is opacification and mucosal thickening involving multiple ethmoid air cells. 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 There is opacification throughout much of a somewhat hypoplastic left sphenoid sinus. There is mild rightward deviation of the nasal septum. There is extensive opacification with likely polypoid change in the right nasal cavity with right nasal cavity obstruction. The left nasal cavity is patent. Ostiomeatal unit complexes are patent bilaterally, although there is narrowing of the ostiomeatal unit complex on the right due to localized soft tissue edema. Soft tissues: There is soft tissue thickening and induration adjacent to the anterior right mandible without well-defined mass or abscess. There is soft tissue swelling over each lower to mid face region with skin thickening but no abscess or evidence of hematoma. There is very little stranding in the fat in these areas. There are multiple lymph nodes inferior to the mandible, largest measuring 1.0 x 0.8 cm. No frank adenopathy is evident by size criteria in this area. No adenopathy seen elsewhere. Tongue and tongue base regions appear normal. Visualized pharynx appears unremarkable. There is osteoarthritic change in the mid cervical spine. Limited  intracranial: Mastoid air cells are clear. Visualized intracranial regions appear unremarkable. IMPRESSION: 1. Soft tissue prominence with mild induration adjacent to the right mandible anteriorly consistent with inflammation. No frank abscess or fluid noted in this area. Adjacent bone appears normal. 2. Soft tissue swelling along each lower to mid face without hematoma or abscess. No significant stranding in the fat in these areas. 3. Several lymph nodes inferior to the mandible may have inflammatory etiology. No frank adenopathy by size criteria. 4. Foci of paranasal sinus disease as described. Note that there is obstruction of the right nasal cavity due to edema and likely underlying polypoid change. Ostiomeatal unit complexes are patent bilaterally, although there is narrowing of the ostiomeatal unit complex on the right due to edema. 5.  No bony destruction or erosion.  No fracture or dislocation. Electronically Signed   By: Lowella Grip III M.D.   On: 03/24/2017 08:56    Scheduled Meds: . ARIPiprazole  20 mg Oral Daily  . budesonide (PULMICORT) nebulizer solution  0.5 mg Nebulization BID  . buPROPion  150 mg Oral BID  . enoxaparin (LOVENOX) injection  60 mg Subcutaneous Q24H  .  folic acid  1 mg Oral Daily  . furosemide  40 mg Intravenous Daily  . gabapentin  300 mg Oral TID WC  . gabapentin  600 mg Oral QHS  . ipratropium-albuterol  3 mL Nebulization QID  . LORazepam  0-4 mg Oral Q6H   Followed by  . [START ON 03/26/2017] LORazepam  0-4 mg Oral Q12H  . methylPREDNISolone (SOLU-MEDROL) injection  40 mg Intravenous Daily  . multivitamin with minerals  1 tablet Oral Daily  . thiamine  100 mg Oral Daily   Or  . thiamine  100 mg Intravenous Daily  . traZODone  100 mg Oral QHS   Continuous Infusions: . azithromycin Stopped (03/24/17 2215)  . cefTRIAXone (ROCEPHIN)  IV Stopped (03/24/17 1808)     LOS: 2 days    Time spent: > 35 minutes  Velvet Bathe, MD [facility] Pager  [phone]  If 7PM-7AM, please contact night-coverage [email or web address] Password [facility] 03/25/2017, 3:38 PM

## 2017-03-25 NOTE — Plan of Care (Signed)
Problem: Tissue Perfusion: Goal: Risk factors for ineffective tissue perfusion will decrease Outcome: Progressing Patient O2 dependent at baseline

## 2017-03-26 LAB — LEGIONELLA PNEUMOPHILA SEROGP 1 UR AG: L. pneumophila Serogp 1 Ur Ag: NEGATIVE

## 2017-03-26 MED ORDER — OXYCODONE-ACETAMINOPHEN 5-325 MG PO TABS: 1.0000 | ORAL_TABLET | Freq: Four times a day (QID) | ORAL | 0 refills | Status: DC | PRN

## 2017-03-26 MED ORDER — IPRATROPIUM-ALBUTEROL 0.5-2.5 (3) MG/3ML IN SOLN: 3.0000 mL | Freq: Two times a day (BID) | RESPIRATORY_TRACT | Status: DC

## 2017-03-26 MED ORDER — CLINDAMYCIN HCL 300 MG PO CAPS: 300.0000 mg | ORAL_CAPSULE | Freq: Three times a day (TID) | ORAL | 0 refills | Status: AC

## 2017-03-26 MED ORDER — PREDNISONE 50 MG PO TABS: ORAL_TABLET | ORAL | 0 refills | Status: DC

## 2017-03-26 MED ORDER — ALBUTEROL SULFATE (2.5 MG/3ML) 0.083% IN NEBU: 2.5000 mg | INHALATION_SOLUTION | RESPIRATORY_TRACT | Status: DC | PRN

## 2017-03-26 MED ORDER — ACETAMINOPHEN 325 MG PO TABS: 650.0000 mg | ORAL_TABLET | Freq: Four times a day (QID) | ORAL | Status: DC | PRN

## 2017-03-26 NOTE — Discharge Summary (Signed)
Physician Discharge Summary  Diane Gilbert PPJ:[id number] DOB: [birth year] DOA: 03/23/2017  PCP: Nolene Ebbs, MD  Admit date: 03/23/2017 Discharge date: 03/26/2017  Time spent: > 35 minutes  Recommendations for Outpatient Follow-up:  1. Monitor hgb levels and blood sugars   Discharge Diagnoses:  Principal Problem:   Acute respiratory failure with hypoxia (HCC) Active Problems:   Alcohol abuse   Schizophrenia (HCC)   COPD exacerbation (HCC)   Acute diastolic congestive heart failure (HCC)   OSA (obstructive sleep apnea)   Discharge Condition: stable  Diet recommendation: heart healthy  Filed Weights   03/24/17 0500 03/25/17 0558 03/26/17 0514  Weight: 116.8 kg (257 lb 8 oz) 113.9 kg (251 lb 3.2 oz) 116.3 kg (256 lb 6.3 oz)    History of present illness:  51 y.o.femalewith history of COPD tobacco abuse CHF interstitial lung disease alcohol abuse and ongoing tobacco abuse presents to the ER because of shortness of breath and right facial swelling. Patient states over the last few weeks patient has been getting increasingly short of breath mostly on exertion. Denies any chest pain. Denies any fever or chills but has been having productive cough.Patient also has been having increasing lower extremity edema. In addition patient over the last 3 days has noticed swelling of the right side of the face and mostly related to her tooth in the lower right jaw.Patient had gone to her PCP and over there on exam patient also had some right facial swelling.  Diagnosed with acute on chronic respiratory failure secondary to copd vs [facility] Course:  1. Acute respiratory failure with hypoxia - Etiology copd vs chf. Discharge with prednisone to complete a total of 5 days of steroid treatment. D/c on antibiotic clindamycin  2. Bilateral worsening hilar lymphadenopathy: Pt to f/u with pulmonologist as outpatient. 3. Right facial swellingwith possible tooth abscess status post I and  D by the ER physician - Pt will be discharged on clindamycin 4. Diastolic dysfunction per 2-D echodone in October 2017 - EF was 65-70% with grade 2 diastolic dysfunction. Place patient on Lasix for now. Closely follow intake output metabolic panel and daily weights. 5. Interstitial lung disease - Critical care team consulted, note reviewed. Plan is for continue outpatient evaluation and sleep study 6. Alcohol abuse - patient placed on CIWA while in house. No withdrawal symptoms noted on exam.  Procedures:  None  Consultations:  Critical care  Discharge Exam: Vitals:   03/26/17 0844 03/26/17 1100  BP:  129/87  Pulse:  79  Resp:    Temp:    SpO2: 93% 95%    General: Pt in nad, alert and awake Cardiovascular: rrr, no rubs Respiratory: no increased wob, no wheezes  Discharge Instructions   Discharge Instructions    Call MD for:  difficulty breathing, headache or visual disturbances    Complete by:  As directed    Call MD for:  severe uncontrolled pain    Complete by:  As directed    Call MD for:  temperature >100.4    Complete by:  As directed    Call MD for:  temperature >100.4    Complete by:  As directed    Diet - low sodium heart healthy    Complete by:  As directed    Diet - low sodium heart healthy    Complete by:  As directed    Discharge instructions    Complete by:  As directed    Please ensure you follow up with  your primary care physician in the next week for further evaluation and recommendations.   Discharge instructions    Complete by:  As directed    Increase activity slowly    Complete by:  As directed    Increase activity slowly    Complete by:  As directed      Current Discharge Medication List    START taking these medications   Details  acetaminophen (TYLENOL) 325 MG tablet Take 2 tablets (650 mg total) by mouth every 6 (six) hours as needed for mild pain (or Fever >/= 101).    clindamycin (CLEOCIN) 300 MG capsule Take 1 capsule (300 mg  total) by mouth 3 (three) times daily. Qty: 12 capsule, Refills: 0    predniSONE (DELTASONE) 50 MG tablet Take 1 tab orally by mouth daily Qty: 2 tablet, Refills: 0      CONTINUE these medications which have CHANGED   Details  oxyCODONE-acetaminophen (PERCOCET/ROXICET) 5-325 MG tablet Take 1 tablet by mouth every 6 (six) hours as needed for severe pain. Qty: 30 tablet, Refills: 0      CONTINUE these medications which have NOT CHANGED   Details  albuterol (PROVENTIL HFA;VENTOLIN HFA) 108 (90 BASE) MCG/ACT inhaler Inhale 2 puffs into the lungs every 6 (six) hours as needed for wheezing or shortness of breath. Qty: 1 Inhaler, Refills: 3    albuterol (PROVENTIL) (2.5 MG/3ML) 0.083% nebulizer solution Take 2.5 mg by nebulization every 6 (six) hours as needed for wheezing or shortness of breath.    ARIPiprazole (ABILIFY) 20 MG tablet Take 20 mg by mouth daily.    buPROPion (WELLBUTRIN SR) 150 MG 12 hr tablet Take 150 mg by mouth See admin instructions. Take 1 tablet (150 mg) by mouth every morning and at noon    gabapentin (NEURONTIN) 300 MG capsule Take 300-600 mg by mouth See admin instructions. 1 capsule three times daily, with 2 at bedtime    OXYGEN Inhale 2 L into the lungs continuous.    traZODone (DESYREL) 100 MG tablet Take 100 mg by mouth at bedtime.     budesonide-formoterol (SYMBICORT) 160-4.5 MCG/ACT inhaler Inhale 2 puffs into the lungs 2 (two) times daily. Qty: 1 Inhaler, Refills: 5    furosemide (LASIX) 40 MG tablet Take 1 tablet (40 mg total) by mouth daily. Qty: 3 tablet, Refills: 0    guaiFENesin (MUCINEX) 600 MG 12 hr tablet Take 1 tablet (600 mg total) by mouth 2 (two) times daily. Qty: 30 tablet, Refills: 0       No Known Allergies Follow-up Information    Nolene Ebbs, MD.   Specialty:  Internal Medicine Contact information: [address] [place] [address] [phone]            The results of significant diagnostics from this  hospitalization (including imaging, microbiology, ancillary and laboratory) are listed below for reference.    Significant Diagnostic Studies: Dg Chest 2 View  Result Date: 03/23/2017 CLINICAL DATA:  Shortness of breath cough and chest pain EXAM: CHEST  2 VIEW COMPARISON:  September 01, 2016 FINDINGS: There is underlying reticulonodular interstitial disease, stable. : There is soft tissue prominence in the right perihilar region. It is difficult to ascertain whether this area represents consolidation versus a mass/ adenopathy in the perihilar region. There is cardiomegaly with pulmonary venous hypertension. No bone lesions are evident. Beyond potential right perihilar adenopathy, no other areas suggest adenopathy by radiography. IMPRESSION: 1. Question infiltrate right perihilar region versus mass/adenopathy. This area warrants correlation with  contrast enhanced chest CT to further evaluate. 2.  Stable underlying reticulonodular interstitial disease. 3. Pulmonary vascular congestion with cardiomegaly and pulmonary venous hypertension. Electronically Signed   By: Lowella Grip III M.D.   On: 03/23/2017 17:31   Ct Chest W Contrast  Result Date: 03/23/2017 CLINICAL DATA:  Chest pain shortness of breath EXAM: CT CHEST WITH CONTRAST TECHNIQUE: Multidetector CT imaging of the chest was performed during intravenous contrast administration. CONTRAST:  67m ISOVUE-300 IOPAMIDOL (ISOVUE-300) INJECTION 61% COMPARISON:  Radiograph 03/23/2017, CT chest 03/24/2016, 01/22/2014 FINDINGS: Cardiovascular: Suboptimal opacification. nonaneurysmal aorta. Minimal atherosclerotic calcification. Mild cardiomegaly. Small pericardial effusion. Mediastinum/Nodes: Midline trachea. No thyroid mass. Similar appearance of scattered mediastinal lymph nodes. Vague low-density foci within the right greater than left pulmonary hila, consistent with adenopathy, slight increased compared to the prior chest CT. Lungs/Pleura: Mild to moderate  emphysema. Diffuse ground-glass density, corresponding to the patient's history of underlying interstitial disease. Overall improved pattern compared with the CT from 2017. No acute consolidations or pleural effusions. Upper Abdomen: No acute abnormality. Musculoskeletal: No acute or suspicious bone lesion. IMPRESSION: 1. Increased radiographic hilar opacity appears to correspond to enlarged right greater than left hilar lymph nodes which have increased compared to the prior CT from 2017. Mild mediastinal nodes otherwise appear unchanged. 2. Mild to moderate emphysema. Diffuse subtle ground-glass density, felt to correspond to patient's history of underlying interstitial lung disease. There are no acute consolidations or pleural effusion seen. Aortic Atherosclerosis (ICD10-I70.0) and Emphysema (ICD10-J43.9). Electronically Signed   By: KDonavan FoilM.D.   On: 03/23/2017 23:11   Ct Maxillofacial Wo Contrast  Result Date: 03/24/2017 CLINICAL DATA:  Soft tissue swelling and redness over face. Soft tissue prominence right lower and mandibular region with pain EXAM: CT MAXILLOFACIAL WITHOUT CONTRAST TECHNIQUE: Multidetector CT imaging of the maxillofacial structures was performed. Multiplanar CT image reconstructions were also generated. COMPARISON:  Nov 01, 2011 FINDINGS: Osseous: There is no demonstrable fracture or dislocation. No erosive change or bony destruction. Orbits: As was noted on 2013 study, globes appear somewhat proptotic bilaterally, a stable finding. Extra-ocular muscles do not appear enlarged. No intraorbital lesions are appreciable on this study. Sinuses: There is opacification and mucosal thickening involving multiple ethmoid air cells. 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 There is opacification throughout much of a somewhat hypoplastic left sphenoid sinus. There is mild rightward deviation of the nasal septum. There is extensive opacification with likely polypoid change in the right nasal cavity with  right nasal cavity obstruction. The left nasal cavity is patent. Ostiomeatal unit complexes are patent bilaterally, although there is narrowing of the ostiomeatal unit complex on the right due to localized soft tissue edema. Soft tissues: There is soft tissue thickening and induration adjacent to the anterior right mandible without well-defined mass or abscess. There is soft tissue swelling over each lower to mid face region with skin thickening but no abscess or evidence of hematoma. There is very little stranding in the fat in these areas. There are multiple lymph nodes inferior to the mandible, largest measuring 1.0 x 0.8 cm. No frank adenopathy is evident by size criteria in this area. No adenopathy seen elsewhere. Tongue and tongue base regions appear normal. Visualized pharynx appears unremarkable. There is osteoarthritic change in the mid cervical spine. Limited intracranial: Mastoid air cells are clear. Visualized intracranial regions appear unremarkable. IMPRESSION: 1. Soft tissue prominence with mild induration adjacent to the right mandible anteriorly consistent with inflammation. No frank abscess or fluid noted in this area. Adjacent bone appears normal.  2. Soft tissue swelling along each lower to mid face without hematoma or abscess. No significant stranding in the fat in these areas. 3. Several lymph nodes inferior to the mandible may have inflammatory etiology. No frank adenopathy by size criteria. 4. Foci of paranasal sinus disease as described. Note that there is obstruction of the right nasal cavity due to edema and likely underlying polypoid change. Ostiomeatal unit complexes are patent bilaterally, although there is narrowing of the ostiomeatal unit complex on the right due to edema. 5.  No bony destruction or erosion.  No fracture or dislocation. Electronically Signed   By: Lowella Grip III M.D.   On: 03/24/2017 08:56    Microbiology: No results found for this or any previous visit  (from the past 240 hour(s)).   Labs: Basic Metabolic Panel:  Recent Labs Lab 03/23/17 1842 03/24/17 0237  NA 137 136  K 4.2 4.0  CL 101 99*  CO2 31 27  GLUCOSE 103* 147*  BUN 6 5*  CREATININE 0.72 0.73  CALCIUM 9.2 9.2  MG  --  2.0   Liver Function Tests:  Recent Labs Lab 03/23/17 1842  AST 37  ALT 45  ALKPHOS 75  BILITOT 0.7  PROT 7.2  ALBUMIN 3.6   No results for input(s): LIPASE, AMYLASE in the last 168 hours. No results for input(s): AMMONIA in the last 168 hours. CBC:  Recent Labs Lab 03/23/17 1842 03/24/17 0237  WBC 12.5* 10.5  NEUTROABS 4.9  --   HGB 15.7* 15.6*  HCT 51.5* 52.4*  MCV 83.5 84.4  PLT 195 223   Cardiac Enzymes:  Recent Labs Lab 03/24/17 0237 03/24/17 1047  TROPONINI <0.03 <0.03   BNP: BNP (last 3 results)  Recent Labs  03/29/16 0525 07/04/16 1510 03/23/17 1842  BNP 92.5 90.8 59.5    ProBNP (last 3 results) No results for input(s): PROBNP in the last 8760 hours.  CBG:  Recent Labs Lab 03/24/17 1101  GLUCAP 171*    Signed:  Velvet Bathe MD.  [facility] 03/26/2017, 1:53 PM

## 2017-03-26 NOTE — Progress Notes (Signed)
Patient stable 7p-7a. No complaints or concerns.

## 2017-04-12 ENCOUNTER — Ambulatory Visit (INDEPENDENT_AMBULATORY_CARE_PROVIDER_SITE_OTHER): Payer: Medicaid Other | Admitting: Obstetrics

## 2017-04-12 ENCOUNTER — Other Ambulatory Visit (HOSPITAL_COMMUNITY)
Admission: RE | Admit: 2017-04-12 | Discharge: 2017-04-12 | Disposition: A | Discharge status: Home / Self Care | Payer: Medicaid Other | Source: Ambulatory Visit | Attending: Obstetrics & Gynecology | Admitting: Obstetrics & Gynecology

## 2017-04-12 ENCOUNTER — Encounter: Payer: Self-pay | Admitting: Obstetrics

## 2017-04-12 VITALS — BP 146/83 | HR 99 | Ht 64.0 in | Wt 247.7 lb

## 2017-04-12 DIAGNOSIS — N898 Other specified noninflammatory disorders of vagina: Secondary | ICD-10-CM

## 2017-04-12 DIAGNOSIS — Z0000 Encounter for general adult medical examination without abnormal findings: Secondary | ICD-10-CM | POA: Diagnosis not present

## 2017-04-12 DIAGNOSIS — F17200 Nicotine dependence, unspecified, uncomplicated: Secondary | ICD-10-CM

## 2017-04-12 DIAGNOSIS — Z6841 Body Mass Index (BMI) 40.0 and over, adult: Secondary | ICD-10-CM

## 2017-04-12 DIAGNOSIS — Z01419 Encounter for gynecological examination (general) (routine) without abnormal findings: Secondary | ICD-10-CM

## 2017-04-12 DIAGNOSIS — J4489 Other specified chronic obstructive pulmonary disease: Secondary | ICD-10-CM

## 2017-04-12 DIAGNOSIS — E66813 Obesity, class 3: Secondary | ICD-10-CM

## 2017-04-12 DIAGNOSIS — J449 Chronic obstructive pulmonary disease, unspecified: Secondary | ICD-10-CM

## 2017-04-12 NOTE — Progress Notes (Signed)
Subjective:        Diane Gilbert is a 51 y.o. female here for a routine exam.  Current complaints: Irregular periods.  Denies intermenstrual bleeding.    Personal health questionnaire:  Is patient Diane Gilbert, have a family history of breast and/or ovarian cancer: no Is there a family history of uterine cancer diagnosed at age < 61, gastrointestinal cancer, urinary tract cancer, family member who is a Field seismologist syndrome-associated carrier: no Is the patient overweight and hypertensive, family history of diabetes, personal history of gestational diabetes, preeclampsia or PCOS: no Is patient over 95, have PCOS,  family history of premature CHD under age 68, diabetes, smoke, have hypertension or peripheral artery disease:  no At any time, has a partner hit, kicked or otherwise hurt or frightened you?: no Over the past 2 weeks, have you felt down, depressed or hopeless?: no Over the past 2 weeks, have you felt little interest or pleasure in doing things?:no   Gynecologic History Patient's last menstrual period was 04/06/2017. Contraception: abstinence Last Pap: 2016. Results were: normal Last mammogram: 2118. Results were: normal  Obstetric History OB History  Gravida Para Term Preterm AB Living  0 0 0 0 0 0  SAB TAB Ectopic Multiple Live Births  0 0 0 0          Past Medical History:  Diagnosis Date  . Alcohol abuse   . Anxiety   . Arthritis   . Depression   . Headache(784.0)   . Hypertension   . Insomnia   . Interstitial lung disease ([place])   . Oxygen dependent   . Schizophrenia ([place])   . Shortness of breath dyspnea   . Sleep apnea   . Sleep apnea     Past Surgical History:  Procedure Laterality Date  . BACK SURGERY    . LUNG BIOPSY Left 07/16/2014   Procedure: LUNG BIOPSY;  Surgeon: Grace Isaac, MD;  Location: [facility];  Service: Thoracic;  Laterality: Left;  Marland Kitchen MULTIPLE TOOTH EXTRACTIONS    . VIDEO ASSISTED THORACOSCOPY Left 07/16/2014   Procedure:  VIDEO ASSISTED THORACOSCOPY;  Surgeon: Grace Isaac, MD;  Location: [place];  Service: Thoracic;  Laterality: Left;  Marland Kitchen VIDEO BRONCHOSCOPY Bilateral 04/11/2014   Procedure: VIDEO BRONCHOSCOPY WITH FLUORO;  Surgeon: Kathee Delton, MD;  Location: [facility];  Service: Cardiopulmonary;  Laterality: Bilateral;  . VIDEO BRONCHOSCOPY N/A 07/16/2014   Procedure: VIDEO BRONCHOSCOPY;  Surgeon: Grace Isaac, MD;  Location: [facility];  Service: Thoracic;  Laterality: N/A;  . WEDGE RESECTION Left 07/16/2014   Procedure: WEDGE RESECTION;  Surgeon: Grace Isaac, MD;  Location: [place];  Service: Thoracic;  Laterality: Left;  left upper lobe lung resection     Current Outpatient Prescriptions:  .  acetaminophen (TYLENOL) 325 MG tablet, Take 2 tablets (650 mg total) by mouth every 6 (six) hours as needed for mild pain (or Fever >/= 101)., Disp: , Rfl:  .  albuterol (PROVENTIL HFA;VENTOLIN HFA) 108 (90 BASE) MCG/ACT inhaler, Inhale 2 puffs into the lungs every 6 (six) hours as needed for wheezing or shortness of breath., Disp: 1 Inhaler, Rfl: 3 .  albuterol (PROVENTIL) (2.5 MG/3ML) 0.083% nebulizer solution, Take 2.5 mg by nebulization every 6 (six) hours as needed for wheezing or shortness of breath., Disp: , Rfl:  .  ARIPiprazole (ABILIFY) 20 MG tablet, Take 20 mg by mouth daily., Disp: , Rfl:  .  budesonide-formoterol (SYMBICORT) 160-4.5 MCG/ACT inhaler, Inhale 2 puffs into  the lungs 2 (two) times daily., Disp: 1 Inhaler, Rfl: 5 .  buPROPion (WELLBUTRIN SR) 150 MG 12 hr tablet, Take 150 mg by mouth See admin instructions. Take 1 tablet (150 mg) by mouth every morning and at noon, Disp: , Rfl:  .  gabapentin (NEURONTIN) 300 MG capsule, Take 300-600 mg by mouth See admin instructions. 1 capsule three times daily, with 2 at bedtime, Disp: , Rfl:  .  oxyCODONE-acetaminophen (PERCOCET/ROXICET) 5-325 MG tablet, Take 1 tablet by mouth every 6 (six) hours as needed for severe pain., Disp: 30 tablet, Rfl: 0 .   OXYGEN, Inhale 2 L into the lungs continuous., Disp: , Rfl:  .  traZODone (DESYREL) 100 MG tablet, Take 100 mg by mouth at bedtime. , Disp: , Rfl:  .  furosemide (LASIX) 40 MG tablet, Take 1 tablet (40 mg total) by mouth daily. (Patient not taking: Reported on 03/23/2017), Disp: 3 tablet, Rfl: 0 No Known Allergies  Social History  Substance Use Topics  . Smoking status: Current Some Day Smoker    Packs/day: 1.50    Years: 30.00    Types: Cigarettes  . Smokeless tobacco: Never Used     Comment: smoking 3 cigarettes daily "every now and then" 11/04/16  . Alcohol use 0.0 oz/week     Comment: Pt has previously abused alcohol , pt states she still drinks "every now and then"    Family History  Problem Relation Age of Onset  . Breast cancer Mother   . COPD Maternal Aunt       Review of Systems  Constitutional: negative for fatigue and weight loss Respiratory: negative for cough and wheezing Cardiovascular: negative for chest pain, fatigue and palpitations Gastrointestinal: negative for abdominal pain and change in bowel habits Musculoskeletal:negative for myalgias Neurological: negative for gait problems and tremors Behavioral/Psych: negative for abusive relationship, depression Endocrine: negative for temperature intolerance    Genitourinary:negative for abnormal menstrual periods, genital lesions, hot flashes, sexual problems and vaginal discharge Integument/breast: negative for breast lump, breast tenderness, nipple discharge and skin lesion(s)    Objective:       BP (!) 146/83   Pulse 99   Ht _0  (1.626 m)   Wt 247 lb 11.2 oz (112.4 kg)   LMP 04/06/2017   BMI 42.52 kg/m  General:   alert  Skin:   no rash or abnormalities  Lungs:   clear to auscultation bilaterally  Heart:   regular rate and rhythm, S1, S2 normal, no murmur, click, rub or gallop  Breasts:   normal without suspicious masses, skin or nipple changes or axillary nodes  Abdomen:  normal findings: no  organomegaly, soft, non-tender and no hernia  Pelvis:  External genitalia: normal general appearance Urinary system: urethral meatus normal and bladder without fullness, nontender Vaginal: normal without tenderness, induration or masses Cervix: normal appearance Adnexa: normal bimanual exam Uterus: anteverted and non-tender, normal size   Lab Review Urine pregnancy test Labs reviewed yes Radiologic studies reviewed yes  50% of 20 min visit spent on counseling and coordination of care.    Assessment:     1. Encounter for gynecological examination with Papanicolaou smear of cervix Rx: - Cytology - PAP  2. Vaginal discharge Rx: - Cervicovaginal ancillary only  3. Tobacco dependence - tobacco cessation recommended - Chantix recommended as an aid in quitting  4. COPD (chronic obstructive pulmonary disease) with chronic bronchitis ([place]) - followed by PCP  5. Class 3 severe obesity due to excess calories with body  mass index (BMI) of 40.0 to 44.9 in adult, unspecified whether serious comorbidity present ([place]) - weight loss program that includes caloric reduction and exercise recommended   Plan:   Contraception:  Abstinence  Education reviewed: calcium supplements, depression evaluation, low fat, low cholesterol diet, safe sex/STD prevention, self breast exams, smoking cessation and weight bearing exercise. Follow up in: 1 year.   No orders of the defined types were placed in this encounter.  No orders of the defined types were placed in this encounter.

## 2017-04-13 LAB — CERVICOVAGINAL ANCILLARY ONLY
BACTERIAL VAGINITIS: POSITIVE — AB
CANDIDA VAGINITIS: NEGATIVE

## 2017-04-14 ENCOUNTER — Other Ambulatory Visit: Payer: Self-pay | Admitting: Obstetrics

## 2017-04-14 DIAGNOSIS — B9689 Other specified bacterial agents as the cause of diseases classified elsewhere: Secondary | ICD-10-CM

## 2017-04-14 DIAGNOSIS — N760 Acute vaginitis: Principal | ICD-10-CM

## 2017-04-14 MED ORDER — SECNIDAZOLE 2 G PO PACK: 1.0000 | PACK | Freq: Once | ORAL | 0 refills | Status: AC

## 2017-04-15 LAB — CYTOLOGY - PAP
DIAGNOSIS: NEGATIVE
HPV (WINDOPATH): DETECTED — AB
HPV 16/18/45 GENOTYPING: NEGATIVE

## 2017-05-09 ENCOUNTER — Inpatient Hospital Stay (HOSPITAL_COMMUNITY)
Admission: EM | Admit: 2017-05-09 | Discharge: 2017-05-11 | DRG: 190 | Disposition: A | Discharge status: Home / Self Care | Payer: Medicaid Other | Attending: Internal Medicine | Admitting: Internal Medicine

## 2017-05-09 ENCOUNTER — Emergency Department (HOSPITAL_COMMUNITY): Payer: Medicaid Other

## 2017-05-09 ENCOUNTER — Other Ambulatory Visit: Payer: Self-pay

## 2017-05-09 ENCOUNTER — Encounter (HOSPITAL_COMMUNITY): Payer: Self-pay | Admitting: Pharmacy Technician

## 2017-05-09 DIAGNOSIS — F17210 Nicotine dependence, cigarettes, uncomplicated: Secondary | ICD-10-CM | POA: Diagnosis present

## 2017-05-09 DIAGNOSIS — F329 Major depressive disorder, single episode, unspecified: Secondary | ICD-10-CM | POA: Diagnosis present

## 2017-05-09 DIAGNOSIS — F209 Schizophrenia, unspecified: Secondary | ICD-10-CM | POA: Diagnosis present

## 2017-05-09 DIAGNOSIS — Z9111 Patient's noncompliance with dietary regimen: Secondary | ICD-10-CM

## 2017-05-09 DIAGNOSIS — J9612 Chronic respiratory failure with hypercapnia: Secondary | ICD-10-CM

## 2017-05-09 DIAGNOSIS — B372 Candidiasis of skin and nail: Secondary | ICD-10-CM | POA: Diagnosis present

## 2017-05-09 DIAGNOSIS — F1010 Alcohol abuse, uncomplicated: Secondary | ICD-10-CM | POA: Diagnosis present

## 2017-05-09 DIAGNOSIS — G4733 Obstructive sleep apnea (adult) (pediatric): Secondary | ICD-10-CM | POA: Diagnosis present

## 2017-05-09 DIAGNOSIS — Z9114 Patient's other noncompliance with medication regimen: Secondary | ICD-10-CM

## 2017-05-09 DIAGNOSIS — Z9119 Patient's noncompliance with other medical treatment and regimen: Secondary | ICD-10-CM

## 2017-05-09 DIAGNOSIS — J9621 Acute and chronic respiratory failure with hypoxia: Secondary | ICD-10-CM | POA: Diagnosis present

## 2017-05-09 DIAGNOSIS — J9622 Acute and chronic respiratory failure with hypercapnia: Secondary | ICD-10-CM | POA: Diagnosis present

## 2017-05-09 DIAGNOSIS — J441 Chronic obstructive pulmonary disease with (acute) exacerbation: Secondary | ICD-10-CM | POA: Diagnosis present

## 2017-05-09 DIAGNOSIS — Z9981 Dependence on supplemental oxygen: Secondary | ICD-10-CM

## 2017-05-09 DIAGNOSIS — I5033 Acute on chronic diastolic (congestive) heart failure: Secondary | ICD-10-CM | POA: Diagnosis present

## 2017-05-09 DIAGNOSIS — J9620 Acute and chronic respiratory failure, unspecified whether with hypoxia or hypercapnia: Secondary | ICD-10-CM

## 2017-05-09 DIAGNOSIS — F17200 Nicotine dependence, unspecified, uncomplicated: Secondary | ICD-10-CM | POA: Diagnosis present

## 2017-05-09 DIAGNOSIS — I444 Left anterior fascicular block: Secondary | ICD-10-CM | POA: Diagnosis present

## 2017-05-09 DIAGNOSIS — I5032 Chronic diastolic (congestive) heart failure: Secondary | ICD-10-CM

## 2017-05-09 DIAGNOSIS — I509 Heart failure, unspecified: Secondary | ICD-10-CM

## 2017-05-09 DIAGNOSIS — I110 Hypertensive heart disease with heart failure: Secondary | ICD-10-CM | POA: Diagnosis present

## 2017-05-09 DIAGNOSIS — R21 Rash and other nonspecific skin eruption: Secondary | ICD-10-CM | POA: Diagnosis present

## 2017-05-09 DIAGNOSIS — Z6841 Body Mass Index (BMI) 40.0 and over, adult: Secondary | ICD-10-CM

## 2017-05-09 DIAGNOSIS — J9611 Chronic respiratory failure with hypoxia: Secondary | ICD-10-CM

## 2017-05-09 DIAGNOSIS — B354 Tinea corporis: Secondary | ICD-10-CM | POA: Diagnosis present

## 2017-05-09 HISTORY — DX: Chronic respiratory failure with hypercapnia: J96.12

## 2017-05-09 HISTORY — DX: Chronic respiratory failure with hypoxia: J96.11

## 2017-05-09 LAB — CBC
HCT: 50.8 % — ABNORMAL HIGH (ref 36.0–46.0)
Hemoglobin: 15.1 g/dL — ABNORMAL HIGH (ref 12.0–15.0)
MCH: 24.8 pg — AB (ref 26.0–34.0)
MCHC: 29.7 g/dL — ABNORMAL LOW (ref 30.0–36.0)
MCV: 83.3 fL (ref 78.0–100.0)
PLATELETS: 171 10*3/uL (ref 150–400)
RBC: 6.1 MIL/uL — AB (ref 3.87–5.11)
RDW: 17.6 % — AB (ref 11.5–15.5)
WBC: 9.5 10*3/uL (ref 4.0–10.5)

## 2017-05-09 LAB — BASIC METABOLIC PANEL
Anion gap: 7 (ref 5–15)
BUN: 7 mg/dL (ref 6–20)
CALCIUM: 8.8 mg/dL — AB (ref 8.9–10.3)
CO2: 30 mmol/L (ref 22–32)
CREATININE: 0.72 mg/dL (ref 0.44–1.00)
Chloride: 100 mmol/L — ABNORMAL LOW (ref 101–111)
GFR calc Af Amer: >60 mL/min (ref >60)
Glucose, Bld: 98 mg/dL (ref 65–99)
POTASSIUM: 4.7 mmol/L (ref 3.5–5.1)
SODIUM: 137 mmol/L (ref 135–145)

## 2017-05-09 LAB — I-STAT TROPONIN, ED: TROPONIN I, POC: 0.01 ng/mL (ref 0.00–0.08)

## 2017-05-09 LAB — TROPONIN I: TROPONIN I: 0.04 ng/mL — AB (ref <0.03)

## 2017-05-09 LAB — BRAIN NATRIURETIC PEPTIDE: B Natriuretic Peptide: 140.4 pg/mL — ABNORMAL HIGH (ref 0.0–100.0)

## 2017-05-09 MED ORDER — NYSTATIN-TRIAMCINOLONE 100000-0.1 UNIT/GM-% EX OINT
TOPICAL_OINTMENT | Freq: Two times a day (BID) | CUTANEOUS | Status: DC
  Administered 2017-05-09 – 2017-05-11 (×4): via TOPICAL
  Filled 2017-05-09: qty 15

## 2017-05-09 MED ORDER — ADULT MULTIVITAMIN W/MINERALS CH
1.0000 | ORAL_TABLET | Freq: Every day | ORAL | Status: DC
  Administered 2017-05-09 – 2017-05-11 (×3): 1 via ORAL
  Filled 2017-05-09 (×3): qty 1

## 2017-05-09 MED ORDER — FUROSEMIDE 10 MG/ML IJ SOLN
20.0000 mg | Freq: Every evening | INTRAMUSCULAR | Status: DC
  Administered 2017-05-09 – 2017-05-10 (×2): 20 mg via INTRAVENOUS
  Filled 2017-05-09 (×2): qty 2

## 2017-05-09 MED ORDER — ONDANSETRON HCL 4 MG/2ML IJ SOLN: 4.0000 mg | Freq: Four times a day (QID) | INTRAMUSCULAR | Status: DC | PRN

## 2017-05-09 MED ORDER — SENNA 8.6 MG PO TABS
1.0000 | ORAL_TABLET | Freq: Two times a day (BID) | ORAL | Status: DC
  Administered 2017-05-10 – 2017-05-11 (×3): 8.6 mg via ORAL
  Filled 2017-05-09 (×3): qty 1

## 2017-05-09 MED ORDER — ASPIRIN 81 MG PO CHEW
81.0000 mg | CHEWABLE_TABLET | Freq: Every day | ORAL | Status: DC
  Administered 2017-05-09 – 2017-05-11 (×3): 81 mg via ORAL
  Filled 2017-05-09 (×3): qty 1

## 2017-05-09 MED ORDER — NITROGLYCERIN 2 % TD OINT
1.0000 [in_us] | TOPICAL_OINTMENT | Freq: Three times a day (TID) | TRANSDERMAL | Status: DC
  Administered 2017-05-09: 1 [in_us] via TOPICAL
  Filled 2017-05-09: qty 30

## 2017-05-09 MED ORDER — NICOTINE 14 MG/24HR TD PT24
14.0000 mg | MEDICATED_PATCH | Freq: Every day | TRANSDERMAL | Status: DC
  Administered 2017-05-09 – 2017-05-11 (×3): 14 mg via TRANSDERMAL
  Filled 2017-05-09 (×3): qty 1

## 2017-05-09 MED ORDER — GABAPENTIN 300 MG PO CAPS
600.0000 mg | ORAL_CAPSULE | Freq: Every day | ORAL | Status: DC
  Administered 2017-05-09: 600 mg via ORAL
  Filled 2017-05-09 (×2): qty 2

## 2017-05-09 MED ORDER — LORAZEPAM 1 MG PO TABS: 1.0000 mg | ORAL_TABLET | Freq: Four times a day (QID) | ORAL | Status: DC | PRN

## 2017-05-09 MED ORDER — OXYCODONE-ACETAMINOPHEN 5-325 MG PO TABS
1.0000 | ORAL_TABLET | Freq: Four times a day (QID) | ORAL | Status: DC | PRN
  Administered 2017-05-09: 1 via ORAL
  Filled 2017-05-09: qty 1

## 2017-05-09 MED ORDER — TRAZODONE HCL 100 MG PO TABS
100.0000 mg | ORAL_TABLET | Freq: Every day | ORAL | Status: DC
  Administered 2017-05-09 – 2017-05-10 (×2): 100 mg via ORAL
  Filled 2017-05-09 (×2): qty 1

## 2017-05-09 MED ORDER — GUAIFENESIN 100 MG/5ML PO SOLN
10.0000 mL | ORAL | Status: DC | PRN
  Filled 2017-05-09: qty 10

## 2017-05-09 MED ORDER — THIAMINE HCL 100 MG/ML IJ SOLN: 100.0000 mg | Freq: Every day | INTRAMUSCULAR | Status: DC

## 2017-05-09 MED ORDER — FLUCONAZOLE 200 MG PO TABS
200.0000 mg | ORAL_TABLET | Freq: Every day | ORAL | Status: DC
  Administered 2017-05-09 – 2017-05-11 (×3): 200 mg via ORAL
  Filled 2017-05-09 (×3): qty 1

## 2017-05-09 MED ORDER — FOLIC ACID 1 MG PO TABS
1.0000 mg | ORAL_TABLET | Freq: Every day | ORAL | Status: DC
  Administered 2017-05-09 – 2017-05-11 (×3): 1 mg via ORAL
  Filled 2017-05-09 (×3): qty 1

## 2017-05-09 MED ORDER — POLYETHYLENE GLYCOL 3350 17 G PO PACK: 17.0000 g | PACK | Freq: Every day | ORAL | Status: DC | PRN

## 2017-05-09 MED ORDER — SODIUM CHLORIDE 0.9% FLUSH: 3.0000 mL | INTRAVENOUS | Status: DC | PRN

## 2017-05-09 MED ORDER — ARIPIPRAZOLE 5 MG PO TABS
20.0000 mg | ORAL_TABLET | Freq: Every day | ORAL | Status: DC
  Administered 2017-05-10 – 2017-05-11 (×2): 20 mg via ORAL
  Filled 2017-05-09 (×2): qty 4

## 2017-05-09 MED ORDER — ALBUTEROL SULFATE (2.5 MG/3ML) 0.083% IN NEBU: 2.5000 mg | INHALATION_SOLUTION | Freq: Four times a day (QID) | RESPIRATORY_TRACT | Status: DC | PRN

## 2017-05-09 MED ORDER — METHYLPREDNISOLONE SODIUM SUCC 40 MG IJ SOLR
40.0000 mg | Freq: Two times a day (BID) | INTRAMUSCULAR | Status: DC
  Administered 2017-05-09 – 2017-05-11 (×4): 40 mg via INTRAVENOUS
  Filled 2017-05-09 (×4): qty 1

## 2017-05-09 MED ORDER — ACETAMINOPHEN 650 MG RE SUPP: 650.0000 mg | Freq: Four times a day (QID) | RECTAL | Status: DC | PRN

## 2017-05-09 MED ORDER — BUPROPION HCL ER (SR) 150 MG PO TB12
150.0000 mg | ORAL_TABLET | Freq: Two times a day (BID) | ORAL | Status: DC
  Administered 2017-05-10 – 2017-05-11 (×4): 150 mg via ORAL
  Filled 2017-05-09 (×4): qty 1

## 2017-05-09 MED ORDER — ALBUTEROL SULFATE (2.5 MG/3ML) 0.083% IN NEBU: 2.5000 mg | INHALATION_SOLUTION | RESPIRATORY_TRACT | Status: DC | PRN

## 2017-05-09 MED ORDER — LORAZEPAM 2 MG/ML IJ SOLN: 1.0000 mg | Freq: Four times a day (QID) | INTRAMUSCULAR | Status: DC | PRN

## 2017-05-09 MED ORDER — ONDANSETRON HCL 4 MG PO TABS: 4.0000 mg | ORAL_TABLET | Freq: Four times a day (QID) | ORAL | Status: DC | PRN

## 2017-05-09 MED ORDER — DOXYCYCLINE HYCLATE 100 MG PO TABS
100.0000 mg | ORAL_TABLET | Freq: Two times a day (BID) | ORAL | Status: DC
  Administered 2017-05-09 – 2017-05-11 (×5): 100 mg via ORAL
  Filled 2017-05-09 (×5): qty 1

## 2017-05-09 MED ORDER — VITAMIN B-1 100 MG PO TABS
100.0000 mg | ORAL_TABLET | Freq: Every day | ORAL | Status: DC
  Administered 2017-05-09 – 2017-05-11 (×3): 100 mg via ORAL
  Filled 2017-05-09 (×3): qty 1

## 2017-05-09 MED ORDER — GABAPENTIN 300 MG PO CAPS: 300.0000 mg | ORAL_CAPSULE | ORAL | Status: DC

## 2017-05-09 MED ORDER — GUAIFENESIN ER 600 MG PO TB12
600.0000 mg | ORAL_TABLET | Freq: Two times a day (BID) | ORAL | Status: DC
  Administered 2017-05-09 – 2017-05-11 (×5): 600 mg via ORAL
  Filled 2017-05-09 (×5): qty 1

## 2017-05-09 MED ORDER — ACETAMINOPHEN 325 MG PO TABS
650.0000 mg | ORAL_TABLET | Freq: Four times a day (QID) | ORAL | Status: DC | PRN
  Administered 2017-05-09: 650 mg via ORAL
  Filled 2017-05-09: qty 2

## 2017-05-09 MED ORDER — MUSCLE RUB 10-15 % EX CREA
TOPICAL_CREAM | CUTANEOUS | Status: DC | PRN
  Administered 2017-05-10 – 2017-05-11 (×2): via TOPICAL
  Filled 2017-05-09: qty 85

## 2017-05-09 MED ORDER — FUROSEMIDE 10 MG/ML IJ SOLN
40.0000 mg | Freq: Every day | INTRAMUSCULAR | Status: DC
  Administered 2017-05-10 – 2017-05-11 (×2): 40 mg via INTRAVENOUS
  Filled 2017-05-09 (×2): qty 4

## 2017-05-09 MED ORDER — ONDANSETRON HCL 4 MG/2ML IJ SOLN
4.0000 mg | Freq: Once | INTRAMUSCULAR | Status: AC
  Administered 2017-05-09: 4 mg via INTRAVENOUS
  Filled 2017-05-09: qty 2

## 2017-05-09 MED ORDER — IPRATROPIUM-ALBUTEROL 0.5-2.5 (3) MG/3ML IN SOLN
3.0000 mL | Freq: Once | RESPIRATORY_TRACT | Status: AC
  Administered 2017-05-09: 3 mL via RESPIRATORY_TRACT
  Filled 2017-05-09: qty 3

## 2017-05-09 MED ORDER — HEPARIN SODIUM (PORCINE) 5000 UNIT/ML IJ SOLN
5000.0000 [IU] | Freq: Three times a day (TID) | INTRAMUSCULAR | Status: DC
  Administered 2017-05-09 – 2017-05-11 (×7): 5000 [IU] via SUBCUTANEOUS
  Filled 2017-05-09 (×6): qty 1

## 2017-05-09 MED ORDER — TRAZODONE HCL 50 MG PO TABS: 50.0000 mg | ORAL_TABLET | Freq: Every evening | ORAL | Status: DC | PRN

## 2017-05-09 MED ORDER — IPRATROPIUM-ALBUTEROL 0.5-2.5 (3) MG/3ML IN SOLN
3.0000 mL | Freq: Four times a day (QID) | RESPIRATORY_TRACT | Status: DC
  Administered 2017-05-09 – 2017-05-10 (×5): 3 mL via RESPIRATORY_TRACT
  Filled 2017-05-09 (×6): qty 3

## 2017-05-09 MED ORDER — GABAPENTIN 300 MG PO CAPS
300.0000 mg | ORAL_CAPSULE | Freq: Three times a day (TID) | ORAL | Status: DC
  Administered 2017-05-09 – 2017-05-10 (×2): 300 mg via ORAL
  Filled 2017-05-09 (×2): qty 1

## 2017-05-09 MED ORDER — SODIUM CHLORIDE 0.9 % IV SOLN: 250.0000 mL | INTRAVENOUS | Status: DC | PRN

## 2017-05-09 MED ORDER — FUROSEMIDE 10 MG/ML IJ SOLN
40.0000 mg | Freq: Once | INTRAMUSCULAR | Status: AC
  Administered 2017-05-09: 40 mg via INTRAVENOUS
  Filled 2017-05-09: qty 4

## 2017-05-09 MED ORDER — FLUTICASONE FUROATE-VILANTEROL 200-25 MCG/INH IN AEPB
1.0000 | INHALATION_SPRAY | Freq: Every day | RESPIRATORY_TRACT | Status: DC
  Administered 2017-05-10 – 2017-05-11 (×2): 1 via RESPIRATORY_TRACT
  Filled 2017-05-09: qty 28

## 2017-05-09 MED ORDER — SODIUM CHLORIDE 0.9% FLUSH
3.0000 mL | Freq: Two times a day (BID) | INTRAVENOUS | Status: DC
  Administered 2017-05-09 – 2017-05-11 (×5): 3 mL via INTRAVENOUS

## 2017-05-09 NOTE — H&P (Addendum)
Patient Demographics:    Diane Gilbert, is a 50 y.o. female  MRN: [id number]   DOB - 05-20-1966  Admit Date - 05/09/2017  Outpatient Primary MD for the patient is Nolene Ebbs, MD   Assessment & Plan:    Principal Problem:   Acute on chronic respiratory failure with hypoxia ([place]) Active Problems:   Alcohol abuse   Smoking addiction   Schizophrenia ([place])   COPD exacerbation (HCC)   OSA (obstructive sleep apnea)   Acute on chronic diastolic CHF (congestive heart failure) (HCC)   Respiratory failure, acute and chronic (HCC)  1)Acute COPD Exacerbation- no definite pneumonia, the patient has worsening hypoxia, productive cough, temp up to 100.3 , treat empirically with IV Solu-Medrol 40 mg every 8 hours, give mucolytics, doxycycline 100 mg bid and bronchodilators as ordered, supplemental oxygen as ordered.    2)HFpEF-no frank ACS type symptoms, patient has acute on chronic diastolic dysfunction CHF exacerbation-most likely due to increased salt intake and noncompliance with Lasix, IV Lasix as ordered, check serial troponins, give topical Nitropaste.  Last known EF 65-70% per echo.  No frank chest pains or ACS type symptoms at this time , check daily weight and monitor fluid input and output closely  3)Acute on Chronic Hypoxic Respiratory Failure -  worsening hypoxia secondary to a combination of #1 and  #2 above, treat as above #1 and 2, at baseline patient uses 2 L of oxygen via nasal cannula she is now requiring about 4 L at this time  4)Tobacco Abuse- patient admits to smoking about a pack and a half a day, smoking cessation strongly advised, give nicotine patch  5)Alcohol Abuse- last alcoholic intake within the last 24 hours, give thiamine, folic acid and multivitamin.  Lorazepam per CIWA  protocol  6)Morbid Obesity/OSA- CPAP nightly  7)Psych/history of depression and schizophrenia-stable at this time, continue Abilify, Wellbutrin and trazodone  8)Tinea Corporis/Candida intertrigo-Diflucan p.o., topical nystatin/mycolog cream, erythematous macular rash with satellite lesions suggestive of tinea/candidal intertrigo   With History of - Reviewed by me  Past Medical History:  Diagnosis Date  . Alcohol abuse   . Anxiety   . Arthritis   . Depression   . Headache(784.0)   . Hypertension   . Insomnia   . Interstitial lung disease ([place])   . Oxygen dependent   . Schizophrenia ([place])   . Shortness of breath dyspnea   . Sleep apnea   . Sleep apnea       Past Surgical History:  Procedure Laterality Date  . BACK SURGERY    . MULTIPLE TOOTH EXTRACTIONS      Chief Complaint  Patient presents with  . Fever  . Cough      HPI:    Diane Gilbert  is a 51 y.o. female past medical history relevant for schizophrenia/depression, noncompliance with medical therapy, obesity with OSA, chronic diastolic dysfunction CHF, COPD and ongoing tobacco use disorder, as well as chronic hypoxic respiratory  failure with 2 L of oxygen via nasal cannula at home who now presents with worsening shortness of breath, requiring up to 4 L of oxygen here.  Patient states she has had chills, low-grade temps T-max 100.3, cough with scant amount of yellow to tan sputum as well as fatigue malaise.   She has shortness of breath, dyspnea on exertion and slight lower extremity edema,, no leg pains or pleuritic symptoms, patient endorses mild orthopnea but no paroxysmal nocturnal dyspnea  Patient had some post-tussive emesis, emesis was without bile or blood,  she did have couple episodes of diarrhea, stools were without blood or mucus.  Denies chest pains palpitations dizziness  . patient admits to smoking about a pack and a half a day,  No sick contacts at home    Review of systems:    In  addition to the HPI above,   A full 12 point Review of 10 Systems was done, except as stated above, all other Review of 10 Systems were negative.    Social History:  Reviewed by me    Social History   Tobacco Use  . Smoking status: Current Some Day Smoker    Packs/day: 1.50    Years: 30.00    Pack years: 45.00    Types: Cigarettes  . Smokeless tobacco: Never Used  . Tobacco comment: smoking 3 cigarettes daily "every now and then" 11/04/16  Substance Use Topics  . Alcohol use: Yes    Alcohol/week: 0.0 oz    Comment: Pt has previously abused alcohol , pt states she still drinks "every now and then"   On further questioning patient admits to drinking alcohol almost on a daily basis, she drinks 3-6 beers a day   Family History :  Reviewed by me    Family History  Problem Relation Age of Onset  . Breast cancer Mother   . COPD Maternal Aunt     Home Medications:   Prior to Admission medications   Medication Sig Start Date End Date Taking? Authorizing Provider  acetaminophen (TYLENOL) 325 MG tablet Take 2 tablets (650 mg total) by mouth every 6 (six) hours as needed for mild pain (or Fever >/= 101). 03/26/17   Velvet Bathe, MD  albuterol (PROVENTIL HFA;VENTOLIN HFA) 108 (90 BASE) MCG/ACT inhaler Inhale 2 puffs into the lungs every 6 (six) hours as needed for wheezing or shortness of breath. 05/09/15   Juanito Doom, MD  albuterol (PROVENTIL) (2.5 MG/3ML) 0.083% nebulizer solution Take 2.5 mg by nebulization every 6 (six) hours as needed for wheezing or shortness of breath.    [provider]  ARIPiprazole (ABILIFY) 20 MG tablet Take 20 mg by mouth daily.    [provider]  budesonide-formoterol (SYMBICORT) 160-4.5 MCG/ACT inhaler Inhale 2 puffs into the lungs 2 (two) times daily. 03/23/17   Parrett, Fonnie Mu, NP  buPROPion (WELLBUTRIN SR) 150 MG 12 hr tablet Take 150 mg by mouth See admin instructions. Take 1 tablet (150 mg) by mouth every morning and at noon     [provider]  furosemide (LASIX) 40 MG tablet Take 1 tablet (40 mg total) by mouth daily. Patient not taking: Reported on 03/23/2017 11/04/16 11/07/16  Kalman Shan Ratliff, DO  gabapentin (NEURONTIN) 300 MG capsule Take 300-600 mg by mouth See admin instructions. 1 capsule three times daily, with 2 at bedtime    [provider]  oxyCODONE-acetaminophen (PERCOCET/ROXICET) 5-325 MG tablet Take 1 tablet by mouth every 6 (six) hours as needed for  severe pain. 03/26/17   Velvet Bathe, MD  OXYGEN Inhale 2 L into the lungs continuous.    [provider]  traZODone (DESYREL) 100 MG tablet Take 100 mg by mouth at bedtime.     [provider]     Allergies:    No Known Allergies   Physical Exam:   Vitals  Blood pressure (!) 150/90, pulse 88, temperature 98.8 F (37.1 C), temperature source Oral, resp. rate 19, SpO2 94 %.  Physical Examination: General appearance - alert, obese appearing, and in mild respiratory distress, able to speak in phrases and short sentences  Mental status - alert, oriented to person, place, and time,  Eyes - sclera anicteric Nose- [place] with 4 L/min Neck - supple, no JVD elevation , Chest -diminished bilaterally, scattered wheezes in upper lung fields, faint bibasilar rales in the lower lung fields Heart - S1 and S2 normal,  Abdomen - soft, nontender, nondistended, increased truncal adiposity  neurological - screening mental status exam normal, neck supple without rigidity, cranial nerves II through XII intact, DTR's normal and symmetric Extremities -1+ pitting pedal edema noted, negative Homans, intact peripheral pulses  Skin - warm, dry, erythematous macular rash with satellite lesions suggestive of tinea/candidal intertrigo    Data Review:    CBC Recent Labs  Lab 05/09/17 1201  WBC 9.5  HGB 15.1*  HCT 50.8*  PLT 171  MCV 83.3  MCH 24.8*  MCHC 29.7*  RDW 17.6*    ------------------------------------------------------------------------------------------------------------------  Chemistries  Recent Labs  Lab 05/09/17 1201  NA 137  K 4.7  CL 100*  CO2 30  GLUCOSE 98  BUN 7  CREATININE 0.72  CALCIUM 8.8*   ------------------------------------------------------------------------------------------------------------------ CrCl cannot be calculated (Unknown ideal weight.). ------------------------------------------------------------------------------------------------------------------ No results for input(s): TSH, T4TOTAL, T3FREE, THYROIDAB in the last 72 hours.  Invalid input(s): FREET3   Coagulation profile No results for input(s): INR, PROTIME in the last 168 hours. ------------------------------------------------------------------------------------------------------------------- No results for input(s): DDIMER in the last 72 hours. -------------------------------------------------------------------------------------------------------------------  Cardiac Enzymes No results for input(s): CKMB, TROPONINI, MYOGLOBIN in the last 168 hours.  Invalid input(s): CK ------------------------------------------------------------------------------------------------------------------    Component Value Date/Time   BNP 140.4 (H) 05/09/2017 1201     ---------------------------------------------------------------------------------------------------------------  Urinalysis    Component Value Date/Time   COLORURINE AMBER (A) 03/24/2016 1530   APPEARANCEUR CLOUDY (A) 03/24/2016 1530   LABSPEC 1.024 03/24/2016 1530   PHURINE 6.0 03/24/2016 1530   GLUCOSEU NEGATIVE 03/24/2016 1530   HGBUR NEGATIVE 03/24/2016 1530   BILIRUBINUR NEGATIVE 03/24/2016 1530   KETONESUR NEGATIVE 03/24/2016 1530   PROTEINUR 30 (A) 03/24/2016 1530   UROBILINOGEN 0.2 07/13/2014 1122   NITRITE NEGATIVE 03/24/2016 1530   LEUKOCYTESUR NEGATIVE 03/24/2016 1530     ----------------------------------------------------------------------------------------------------------------   Imaging Results:    Dg Chest 2 View  Result Date: 05/09/2017 CLINICAL DATA:  Cough and fever EXAM: CHEST  2 VIEW COMPARISON:  Chest CT January 20, 2017 and chest radiograph January 20, 2017 FINDINGS: There is no edema or consolidation. Heart is enlarged with pulmonary venous hypertension. No adenopathy. No evident bone lesions. IMPRESSION: Evidence of pulmonary vascular congestion without edema or consolidation. Electronically Signed   By: Lowella Grip III M.D.   On: 05/09/2017 12:39    Radiological Exams on Admission: Dg Chest 2 View  Result Date: 05/09/2017 CLINICAL DATA:  Cough and fever EXAM: CHEST  2 VIEW COMPARISON:  Chest CT January 20, 2017 and chest radiograph January 20, 2017 FINDINGS: There is no edema or consolidation. Heart  is enlarged with pulmonary venous hypertension. No adenopathy. No evident bone lesions. IMPRESSION: Evidence of pulmonary vascular congestion without edema or consolidation. Electronically Signed   By: Lowella Grip III M.D.   On: 05/09/2017 12:39    DVT Prophylaxis -SCD/Heparin AM Labs Ordered, also please review Full Orders  Family Communication: Admission, patients condition and plan of care including tests being ordered have been discussed with the patient who indicate understanding and agree with the plan   Code Status - Full Code  Likely DC to  Home  Condition   stable  Diane Gilbert M.D on 05/09/2017 at 2:50 PM   Between 7am to 7pm - Pager - [phone] After 7pm go to [email or web address] - password [email or web address]  [facility] - Office  [phone]  Voice Recognition Viviann Spare dictation system was used to create this note, attempts have been made to correct errors. Please contact the author with questions and/or clarifications.

## 2017-05-09 NOTE — Progress Notes (Signed)
Troponin 0.04, no s/s, MD notified, will continue to monitor, Thanks Arvella Nigh RN

## 2017-05-09 NOTE — ED Notes (Signed)
Pt ambulated to the restroom without difficulty. Gait steady/even.

## 2017-05-09 NOTE — Progress Notes (Signed)
Patient admitted to [facility] from emergency room, VSS.  Patient O2 dependent, maintaining sats in the 90's on 2 liters.  Patient c/o headache d/t not having any food and request sent to MD for diet other than clear liquids.  Patient did report a fall earlier today when her legs gave out, informed patient that she is a high fall risk d/t her prior fall, yellow socks, yellow arm bracelet and bed alarm initiated.  Patient verbalized understanding.

## 2017-05-09 NOTE — ED Notes (Signed)
Family at bedside. 

## 2017-05-09 NOTE — ED Notes (Signed)
Patient transported to X-ray 

## 2017-05-09 NOTE — ED Triage Notes (Signed)
Pt arrives via [facility] from home with reports of fever cough NVD chills for 3 days. Tmax 100.3. Pt with hx of chf and copd. Oxygen dependent on 2L [place]. NAD upon arrival. Pt face is flushed. VSS with ems

## 2017-05-09 NOTE — ED Notes (Signed)
Pt returned from xray

## 2017-05-09 NOTE — ED Provider Notes (Signed)
[facility] EMERGENCY DEPARTMENT Provider Note   CSN: [id number] Arrival date & time: 05/09/17  1122     History   Chief Complaint Chief Complaint  Patient presents with  . Fever  . Cough    HPI Diane Gilbert is a 51 y.o. female.  HPI  51 y.o. female with a hx of ETOH Abuse, CHF, COPD, HTN, Schizophrenia, presents to the Emergency Department today via EMS due to cough with associated fever. Cough is productive. Notes rhinorrhea and congestion. No hemoptysis. Notes symptoms x 3 days. Notes N/V/D. No sick contacts. Denies chest pain. Notes shortness of breath. Pt is oxygen dependant and uses 2L [place]. Notes fever 100.12F at home. No abdominal pain. No dysuria. No other symptoms noted    Past Medical History:  Diagnosis Date  . Alcohol abuse   . Anxiety   . Arthritis   . Depression   . Headache(784.0)   . Hypertension   . Insomnia   . Interstitial lung disease ([place])   . Oxygen dependent   . Schizophrenia ([place])   . Shortness of breath dyspnea   . Sleep apnea   . Sleep apnea     Patient Active Problem List   Diagnosis Date Noted  . Facial swelling 03/23/2017  . Chronic respiratory failure with hypoxia ([place]) 03/23/2017  . Acute respiratory failure with hypoxia ([place]) 03/23/2017  . Community acquired pneumonia   . OSA (obstructive sleep apnea) 07/21/2016  . Increased oxygen demand 07/04/2016  . COPD exacerbation ([place]) 07/04/2016  . COPD (chronic obstructive pulmonary disease) ([place]) 07/04/2016  . Acute diastolic congestive heart failure ([place])   . Hypoxia 03/24/2016  . Schizophrenia ([place]) 03/24/2016  . Leg swelling 01/16/2016  . Dyspnea 09/06/2015  . Acute on chronic respiratory failure with hypoxia ([place]) 06/07/2014  . Respiratory bronchiolitis interstitial lung disease ([place]) 12/26/2013  . Irregular menstrual cycle 01/23/2013  . Smoking addiction 01/23/2013  . Alcohol abuse 02/19/2012  . Hyponatremia 02/19/2012  . Abdominal pain 02/19/2012  .  Hypokalemia 02/19/2012    Past Surgical History:  Procedure Laterality Date  . BACK SURGERY    . MULTIPLE TOOTH EXTRACTIONS      OB History    Gravida Para Term Preterm AB Living   0 0 0 0 0 0   SAB TAB Ectopic Multiple Live Births   0 0 0 0         Home Medications    Prior to Admission medications   Medication Sig Start Date End Date Taking? Authorizing Provider  acetaminophen (TYLENOL) 325 MG tablet Take 2 tablets (650 mg total) by mouth every 6 (six) hours as needed for mild pain (or Fever >/= 101). 03/26/17   Velvet Bathe, MD  albuterol (PROVENTIL HFA;VENTOLIN HFA) 108 (90 BASE) MCG/ACT inhaler Inhale 2 puffs into the lungs every 6 (six) hours as needed for wheezing or shortness of breath. 05/09/15   Juanito Doom, MD  albuterol (PROVENTIL) (2.5 MG/3ML) 0.083% nebulizer solution Take 2.5 mg by nebulization every 6 (six) hours as needed for wheezing or shortness of breath.    [provider]  ARIPiprazole (ABILIFY) 20 MG tablet Take 20 mg by mouth daily.    [provider]  budesonide-formoterol (SYMBICORT) 160-4.5 MCG/ACT inhaler Inhale 2 puffs into the lungs 2 (two) times daily. 03/23/17   Parrett, Fonnie Mu, NP  buPROPion (WELLBUTRIN SR) 150 MG 12 hr tablet Take 150 mg by mouth See admin instructions. Take 1 tablet (150 mg) by mouth every  morning and at noon    [provider]  furosemide (LASIX) 40 MG tablet Take 1 tablet (40 mg total) by mouth daily. Patient not taking: Reported on 03/23/2017 11/04/16 11/07/16  Kalman Shan Ratliff, DO  gabapentin (NEURONTIN) 300 MG capsule Take 300-600 mg by mouth See admin instructions. 1 capsule three times daily, with 2 at bedtime    [provider]  oxyCODONE-acetaminophen (PERCOCET/ROXICET) 5-325 MG tablet Take 1 tablet by mouth every 6 (six) hours as needed for severe pain. 03/26/17   Velvet Bathe, MD  OXYGEN Inhale 2 L into the lungs continuous.    [provider]  traZODone (DESYREL)  100 MG tablet Take 100 mg by mouth at bedtime.     [provider]    Family History Family History  Problem Relation Age of Onset  . Breast cancer Mother   . COPD Maternal Aunt     Social History Social History   Tobacco Use  . Smoking status: Current Some Day Smoker    Packs/day: 1.50    Years: 30.00    Pack years: 45.00    Types: Cigarettes  . Smokeless tobacco: Never Used  . Tobacco comment: smoking 3 cigarettes daily "every now and then" 11/04/16  Substance Use Topics  . Alcohol use: Yes    Alcohol/week: 0.0 oz    Comment: Pt has previously abused alcohol , pt states she still drinks "every now and then"  . Drug use: No     Allergies   Patient has no known allergies.   Review of Systems Review of Systems ROS reviewed and all are negative for acute change except as noted in the HPI.  Physical Exam Updated Vital Signs BP 116/77   Pulse 87   Temp 98.8 F (37.1 C) (Oral)   Resp (!) 21   SpO2 90%   Physical Exam  Constitutional: She is oriented to person, place, and time. She appears well-developed and well-nourished. No distress.  HENT:  Head: Normocephalic and atraumatic.  Right Ear: Tympanic membrane, external ear and ear canal normal.  Left Ear: Tympanic membrane, external ear and ear canal normal.  Nose: Nose normal.  Mouth/Throat: Uvula is midline, oropharynx is clear and moist and mucous membranes are normal. No trismus in the jaw. No oropharyngeal exudate, posterior oropharyngeal erythema or tonsillar abscesses.  Eyes: EOM are normal. Pupils are equal, round, and reactive to light.  Neck: Normal range of motion. Neck supple. No tracheal deviation present.  Cardiovascular: Normal rate, regular rhythm, S1 normal, S2 normal, normal heart sounds, intact distal pulses and normal pulses.  Pulmonary/Chest: Effort normal. No respiratory distress. She has decreased breath sounds in the right upper field, the right lower field, the left upper field and  the left lower field. She has wheezes in the right upper field, the right lower field, the left upper field and the left lower field. She has no rhonchi. She has no rales.  Abdominal: Normal appearance and bowel sounds are normal. There is no tenderness.  Musculoskeletal: Normal range of motion.  Neurological: She is alert and oriented to person, place, and time.  Skin: Skin is warm and dry.  Psychiatric: She has a normal mood and affect. Her speech is normal and behavior is normal. Thought content normal.  Nursing note and vitals reviewed.  ED Treatments / Results  Labs (all labs ordered are listed, but only abnormal results are displayed) Labs Reviewed  CBC - Abnormal; Notable for the following components:  Result Value   RBC 6.10 (*)    Hemoglobin 15.1 (*)    HCT 50.8 (*)    MCH 24.8 (*)    MCHC 29.7 (*)    RDW 17.6 (*)    All other components within normal limits  BASIC METABOLIC PANEL  BRAIN NATRIURETIC PEPTIDE  I-STAT TROPONIN, ED    EKG  EKG Interpretation None       Radiology Dg Chest 2 View  Result Date: 05/09/2017 CLINICAL DATA:  Cough and fever EXAM: CHEST  2 VIEW COMPARISON:  Chest CT January 20, 2017 and chest radiograph January 20, 2017 FINDINGS: There is no edema or consolidation. Heart is enlarged with pulmonary venous hypertension. No adenopathy. No evident bone lesions. IMPRESSION: Evidence of pulmonary vascular congestion without edema or consolidation. Electronically Signed   By: Lowella Grip III M.D.   On: 05/09/2017 12:39    Procedures Procedures (including critical care time)  Medications Ordered in ED Medications  furosemide (LASIX) injection 40 mg (not administered)  ipratropium-albuterol (DUONEB) 0.5-2.5 (3) MG/3ML nebulizer solution 3 mL (3 mLs Nebulization Given 05/09/17 1158)     Initial Impression / Assessment and Plan / ED Course  I have reviewed the triage vital signs and the nursing notes.  Pertinent labs & imaging results that  were available during my care of the patient were reviewed by me and considered in my medical decision making (see chart for details).  Final Clinical Impressions(s) / ED Diagnoses  {I have reviewed and evaluated the relevant laboratory values. {I have reviewed and evaluated the relevant imaging studies. {I have interpreted the relevant EKG. {I have reviewed the relevant previous healthcare records. {I have reviewed EMS Documentation. {I obtained HPI from historian. {Patient discussed with supervising physician.  ED Course:  Assessment: Pt is a 51 y.o. female with a hx of ETOH Abuse, CHF, COPD, HTN, Schizophrenia, presents to the Emergency Department today via EMS due to cough with associated fever. Cough is productive. Notes rhinorrhea and congestion. No hemoptysis. Notes symptoms x 3 days. Notes N/V/D. No sick contacts. Denies chest pain. Notes shortness of breath. Pt is oxygen dependant and uses 2L [place]. Notes fever 100.36F at home. No abdominal pain. No dysuria. On exam, pt in NAD. Nontoxic/nonseptic appearing. VSS other than hypoxia in low 90s to 80s on 2L [place]. Incraesed to 4L with improvement. Afebrile. Lungs bilateral wheeze. Heart RRR. Abdomen nontender soft. Trop negative. EKG unremarkable. CBC without leukocytosis. BMP unremarkable. CXR with vascular congestion. Given neb and 82m IV Lasix in ED. Pt notes not having Lasix from PCP and has not taken since September. Plan is to admit to medicine due to new oxygen requirement 2/2 likely CHF exacerbation. [facility] is to [facility]   Disposition/Plan:  Admit Pt acknowledges and agrees with plan  Supervising Physician BMalvin Johns MD  Final diagnoses:  Acute on chronic congestive heart failure, unspecified heart failure type ([facility]    ED Discharge Orders    None       MShary Decamp PA-C 05/09/17 [place] MD 05/09/17 1329

## 2017-05-10 ENCOUNTER — Encounter (HOSPITAL_COMMUNITY): Payer: Self-pay | Admitting: General Practice

## 2017-05-10 DIAGNOSIS — Z9981 Dependence on supplemental oxygen: Secondary | ICD-10-CM | POA: Diagnosis not present

## 2017-05-10 DIAGNOSIS — I110 Hypertensive heart disease with heart failure: Secondary | ICD-10-CM | POA: Diagnosis present

## 2017-05-10 DIAGNOSIS — F329 Major depressive disorder, single episode, unspecified: Secondary | ICD-10-CM | POA: Diagnosis present

## 2017-05-10 DIAGNOSIS — F17210 Nicotine dependence, cigarettes, uncomplicated: Secondary | ICD-10-CM | POA: Diagnosis present

## 2017-05-10 DIAGNOSIS — Z9111 Patient's noncompliance with dietary regimen: Secondary | ICD-10-CM | POA: Diagnosis not present

## 2017-05-10 DIAGNOSIS — F17200 Nicotine dependence, unspecified, uncomplicated: Secondary | ICD-10-CM

## 2017-05-10 DIAGNOSIS — F209 Schizophrenia, unspecified: Secondary | ICD-10-CM | POA: Diagnosis present

## 2017-05-10 DIAGNOSIS — F203 Undifferentiated schizophrenia: Secondary | ICD-10-CM | POA: Diagnosis not present

## 2017-05-10 DIAGNOSIS — J441 Chronic obstructive pulmonary disease with (acute) exacerbation: Principal | ICD-10-CM

## 2017-05-10 DIAGNOSIS — I5033 Acute on chronic diastolic (congestive) heart failure: Secondary | ICD-10-CM | POA: Diagnosis present

## 2017-05-10 DIAGNOSIS — J9621 Acute and chronic respiratory failure with hypoxia: Secondary | ICD-10-CM

## 2017-05-10 DIAGNOSIS — F1010 Alcohol abuse, uncomplicated: Secondary | ICD-10-CM

## 2017-05-10 DIAGNOSIS — Z9114 Patient's other noncompliance with medication regimen: Secondary | ICD-10-CM | POA: Diagnosis not present

## 2017-05-10 DIAGNOSIS — B372 Candidiasis of skin and nail: Secondary | ICD-10-CM | POA: Diagnosis present

## 2017-05-10 DIAGNOSIS — G4733 Obstructive sleep apnea (adult) (pediatric): Secondary | ICD-10-CM | POA: Diagnosis present

## 2017-05-10 DIAGNOSIS — Z6841 Body Mass Index (BMI) 40.0 and over, adult: Secondary | ICD-10-CM | POA: Diagnosis not present

## 2017-05-10 DIAGNOSIS — B354 Tinea corporis: Secondary | ICD-10-CM | POA: Diagnosis present

## 2017-05-10 DIAGNOSIS — Z9119 Patient's noncompliance with other medical treatment and regimen: Secondary | ICD-10-CM | POA: Diagnosis not present

## 2017-05-10 DIAGNOSIS — I444 Left anterior fascicular block: Secondary | ICD-10-CM | POA: Diagnosis present

## 2017-05-10 DIAGNOSIS — R21 Rash and other nonspecific skin eruption: Secondary | ICD-10-CM | POA: Diagnosis present

## 2017-05-10 LAB — BASIC METABOLIC PANEL
Anion gap: 7 (ref 5–15)
BUN: 9 mg/dL (ref 6–20)
CALCIUM: 9.3 mg/dL (ref 8.9–10.3)
CO2: 36 mmol/L — ABNORMAL HIGH (ref 22–32)
CREATININE: 0.97 mg/dL (ref 0.44–1.00)
Chloride: 93 mmol/L — ABNORMAL LOW (ref 101–111)
GFR calc non Af Amer: >60 mL/min (ref >60)
Glucose, Bld: 133 mg/dL — ABNORMAL HIGH (ref 65–99)
Potassium: 5.3 mmol/L — ABNORMAL HIGH (ref 3.5–5.1)
SODIUM: 136 mmol/L (ref 135–145)

## 2017-05-10 LAB — TROPONIN I

## 2017-05-10 MED ORDER — AMLODIPINE BESYLATE 5 MG PO TABS
5.0000 mg | ORAL_TABLET | Freq: Every day | ORAL | Status: DC
  Administered 2017-05-10 – 2017-05-11 (×2): 5 mg via ORAL
  Filled 2017-05-10 (×2): qty 1

## 2017-05-10 MED ORDER — QUETIAPINE FUMARATE 300 MG PO TABS
300.0000 mg | ORAL_TABLET | Freq: Every day | ORAL | Status: DC
  Administered 2017-05-10: 300 mg via ORAL
  Filled 2017-05-10 (×2): qty 1

## 2017-05-10 NOTE — Progress Notes (Signed)
05/10/17 0451  What Happened  Was patient injured? No  Patient found on floor  Found by Staff-comment  Stated prior activity bathroom-unassisted  Follow Up  MD notified yes   Time MD notified [place] notified No- patient refusal (pt did not want family notified )  Vitals  Temp (!) 97.4 F (36.3 C)  Temp Source Oral  BP (!) 148/80  BP Location Left Arm  BP Method Automatic  Patient Position (if appropriate) Lying  Pulse Rate 95  Pulse Rate Source Dinamap  Resp 16  Oxygen Therapy  SpO2 92 %  O2 Device Nasal Cannula  O2 Flow Rate (L/min) 3 L/min

## 2017-05-10 NOTE — Progress Notes (Signed)
Pt is on neurotin 600 mg at night and on Trazadone 100 mg at night, she may need her med doses lowered, due to being really sleepy, will continue to monitor, Thanks Arvella Nigh RN.

## 2017-05-10 NOTE — Progress Notes (Signed)
Pt had a fall with this admission.Pt. refused the bed alarm. Pt  educated the importance of fall prevention safety. Pt stated "I will call if I need you." Call bell within reach. Will continue to monitor pt.

## 2017-05-10 NOTE — Progress Notes (Signed)
PROGRESS NOTE    Diane Gilbert  ZOX:[id number] DOB: 1965/11/21 DOA: 05/09/2017 PCP: Nolene Ebbs, MD   Outpatient Specialists:     Brief Narrative:  Diane Gilbert  is a 51 y.o. female past medical history relevant for schizophrenia/depression, noncompliance with medical therapy, obesity with OSA, chronic diastolic dysfunction CHF, COPD and ongoing tobacco use disorder, as well as chronic hypoxic respiratory failure with 2 L of oxygen via nasal cannula at home who now presents with worsening shortness of breath, requiring up to 4 L of oxygen here.  Patient states she has had chills, low-grade temps T-max 100.3, cough with scant amount of yellow to tan sputum as well as fatigue malaise.   She has shortness of breath, dyspnea on exertion and slight lower extremity edema,, no leg pains or pleuritic symptoms, patient endorses mild orthopnea but no paroxysmal nocturnal dyspnea  Patient had some post-tussive emesis, emesis was without bile or blood,  she did have couple episodes of diarrhea, stools were without blood or mucus.  Denies chest pains palpitations dizziness  . patient admits to smoking about a pack and a half a day,      Assessment & Plan:   Principal Problem:   Acute on chronic respiratory failure with hypoxia ([place]) Active Problems:   Alcohol abuse   Smoking addiction   Schizophrenia (HCC)   COPD exacerbation (HCC)   OSA (obstructive sleep apnea)   Acute on chronic diastolic CHF (congestive heart failure) (HCC)   Respiratory failure, acute and chronic (HCC)   Acute COPD Exacerbation-  -treat empirically with IV Solu-Medrol 40 mg every 8 hours -give mucolytics -doxycycline 100 mg bid  -bronchodilators as ordered    Chronic HFpEF-no frank ACS type symptoms -check daily weight and monitor fluid input and output closely  Acute on Chronic Hypoxic Respiratory Failure -  worsening hypoxia secondary to COPD-- wean back to baseline  Tobacco Abuse-  patient admits to smoking about a pack and a half a day, smoking cessation strongly advised, give nicotine patch  Alcohol Abuse- last alcoholic intake within the last 24 hours, give thiamine, folic acid and multivitamin.  Lorazepam per CIWA protocol  Morbid Obesity/OSA- CPAP nightly  Psych/history of depression and schizophrenia-stable at this time, continue Abilify, Wellbutrin and trazodone  Tinea Corporis/Candida intertrigo-Diflucan p.o., topical nystatin/mycolog cream, erythematous macular rash with satellite lesions suggestive of tinea/candidal intertrigo      DVT prophylaxis:  SQ Heparin  Code Status: Full Code   Family Communication:   Disposition Plan:  Home once able to wean down steroids and O2   Consultants:     Subjective: Says she is not taking neurontin  Objective: Vitals:   05/10/17 0451 05/10/17 0515 05/10/17 0546 05/10/17 0937  BP: (!) 148/80     Pulse: 95     Resp: 16  20   Temp: (!) 97.4 F (36.3 C)     TempSrc: Oral     SpO2: 92%  92% 92%  Weight:  112.4 kg (247 lb 11.2 oz)    Height:        Intake/Output Summary (Last 24 hours) at 05/10/2017 1330 Last data filed at 05/10/2017 1249 Gross per 24 hour  Intake 720 ml  Output 2075 ml  Net -1355 ml   Filed Weights   05/09/17 1541 05/10/17 0515  Weight: 112.2 kg (247 lb 5.7 oz) 112.4 kg (247 lb 11.2 oz)    Examination:  General exam: resting Respiratory system: diminished, few wheezes Cardiovascular system: rrr Gastrointestinal system: +Bs, soft  Extremities: Symmetric 5 x 5 power. Skin: No rashes, lesions or ulcers     Data Reviewed: I have personally reviewed following labs and imaging studies  CBC: Recent Labs  Lab 05/09/17 1201  WBC 9.5  HGB 15.1*  HCT 50.8*  MCV 83.3  PLT 315   Basic Metabolic Panel: Recent Labs  Lab 05/09/17 1201 05/10/17 0059  NA 137 136  K 4.7 5.3*  CL 100* 93*  CO2 30 36*  GLUCOSE 98 133*  BUN 7 9  CREATININE 0.72 0.97  CALCIUM  8.8* 9.3   GFR: Estimated Creatinine Clearance: 87.1 mL/min (by C-G formula based on SCr of 0.97 mg/dL). Liver Function Tests: No results for input(s): AST, ALT, ALKPHOS, BILITOT, PROT, ALBUMIN in the last 168 hours. No results for input(s): LIPASE, AMYLASE in the last 168 hours. No results for input(s): AMMONIA in the last 168 hours. Coagulation Profile: No results for input(s): INR, PROTIME in the last 168 hours. Cardiac Enzymes: Recent Labs  Lab 05/09/17 1825 05/10/17 0103  TROPONINI 0.04* <0.03   BNP (last 3 results) No results for input(s): PROBNP in the last 8760 hours. HbA1C: No results for input(s): HGBA1C in the last 72 hours. CBG: No results for input(s): GLUCAP in the last 168 hours. Lipid Profile: No results for input(s): CHOL, HDL, LDLCALC, TRIG, CHOLHDL, LDLDIRECT in the last 72 hours. Thyroid Function Tests: No results for input(s): TSH, T4TOTAL, FREET4, T3FREE, THYROIDAB in the last 72 hours. Anemia Panel: No results for input(s): VITAMINB12, FOLATE, FERRITIN, TIBC, IRON, RETICCTPCT in the last 72 hours. Urine analysis:    Component Value Date/Time   COLORURINE AMBER (A) 03/24/2016 1530   APPEARANCEUR CLOUDY (A) 03/24/2016 1530   LABSPEC 1.024 03/24/2016 1530   PHURINE 6.0 03/24/2016 1530   GLUCOSEU NEGATIVE 03/24/2016 1530   HGBUR NEGATIVE 03/24/2016 1530   BILIRUBINUR NEGATIVE 03/24/2016 1530   KETONESUR NEGATIVE 03/24/2016 1530   PROTEINUR 30 (A) 03/24/2016 1530   UROBILINOGEN 0.2 07/13/2014 1122   NITRITE NEGATIVE 03/24/2016 1530   LEUKOCYTESUR NEGATIVE 03/24/2016 1530     )No results found for this or any previous visit (from the past 240 hour(s)).    Anti-infectives (From admission, onward)   Start     Dose/Rate Route Frequency Ordered Stop   05/09/17 1415  fluconazole (DIFLUCAN) tablet 200 mg     200 mg Oral Daily 05/09/17 1404 05/14/17 0959   05/09/17 1415  doxycycline (VIBRA-TABS) tablet 100 mg     100 mg Oral Every 12 hours 05/09/17  1404         Radiology Studies: Dg Chest 2 View  Result Date: 05/09/2017 CLINICAL DATA:  Cough and fever EXAM: CHEST  2 VIEW COMPARISON:  Chest CT January 20, 2017 and chest radiograph January 20, 2017 FINDINGS: There is no edema or consolidation. Heart is enlarged with pulmonary venous hypertension. No adenopathy. No evident bone lesions. IMPRESSION: Evidence of pulmonary vascular congestion without edema or consolidation. Electronically Signed   By: Lowella Grip III M.D.   On: 05/09/2017 12:39        Scheduled Meds: . amLODipine  5 mg Oral Daily  . ARIPiprazole  20 mg Oral Daily  . aspirin  81 mg Oral Daily  . buPROPion  150 mg Oral BID  . doxycycline  100 mg Oral Q12H  . fluconazole  200 mg Oral Daily  . fluticasone furoate-vilanterol  1 puff Inhalation Daily  . folic acid  1 mg Oral Daily  . furosemide  20 mg  Intravenous QPM  . furosemide  40 mg Intravenous Daily  . guaiFENesin  600 mg Oral BID  . heparin  5,000 Units Subcutaneous Q8H  . ipratropium-albuterol  3 mL Nebulization Q6H  . methylPREDNISolone (SOLU-MEDROL) injection  40 mg Intravenous Q12H  . multivitamin with minerals  1 tablet Oral Daily  . nicotine  14 mg Transdermal Daily  . nystatin-triamcinolone ointment   Topical BID  . QUEtiapine  300 mg Oral QHS  . senna  1 tablet Oral BID  . sodium chloride flush  3 mL Intravenous Q12H  . thiamine  100 mg Oral Daily  . traZODone  100 mg Oral QHS   Continuous Infusions: . sodium chloride       LOS: 0 days    Time spent: 25 min    [place], DO [facility] Pager [phone]  If 7PM-7AM, please contact night-coverage [email or web address] Password [email or web address] 05/10/2017, 1:30 PM

## 2017-05-11 DIAGNOSIS — F203 Undifferentiated schizophrenia: Secondary | ICD-10-CM

## 2017-05-11 DIAGNOSIS — I5033 Acute on chronic diastolic (congestive) heart failure: Secondary | ICD-10-CM

## 2017-05-11 LAB — BASIC METABOLIC PANEL
Anion gap: 7 (ref 5–15)
BUN: 12 mg/dL (ref 6–20)
CHLORIDE: 92 mmol/L — AB (ref 101–111)
CO2: 40 mmol/L — ABNORMAL HIGH (ref 22–32)
Calcium: 9.6 mg/dL (ref 8.9–10.3)
Creatinine, Ser: 0.9 mg/dL (ref 0.44–1.00)
GFR calc non Af Amer: >60 mL/min (ref >60)
Glucose, Bld: 105 mg/dL — ABNORMAL HIGH (ref 65–99)
POTASSIUM: 4.1 mmol/L (ref 3.5–5.1)
SODIUM: 139 mmol/L (ref 135–145)

## 2017-05-11 LAB — CBC
HEMATOCRIT: 54.2 % — AB (ref 36.0–46.0)
HEMOGLOBIN: 15.9 g/dL — AB (ref 12.0–15.0)
MCH: 24.7 pg — AB (ref 26.0–34.0)
MCHC: 29.3 g/dL — AB (ref 30.0–36.0)
MCV: 84.2 fL (ref 78.0–100.0)
Platelets: 174 10*3/uL (ref 150–400)
RBC: 6.44 MIL/uL — AB (ref 3.87–5.11)
RDW: 18.1 % — ABNORMAL HIGH (ref 11.5–15.5)
WBC: 10.1 10*3/uL (ref 4.0–10.5)

## 2017-05-11 MED ORDER — PREDNISONE 20 MG PO TABS: ORAL_TABLET | ORAL | 0 refills | Status: DC

## 2017-05-11 MED ORDER — DOXYCYCLINE HYCLATE 100 MG PO TABS: 100.0000 mg | ORAL_TABLET | Freq: Two times a day (BID) | ORAL | 0 refills | Status: DC

## 2017-05-11 MED ORDER — FUROSEMIDE 40 MG PO TABS: 40.0000 mg | ORAL_TABLET | Freq: Every day | ORAL | 0 refills | Status: DC

## 2017-05-11 MED ORDER — GABAPENTIN 300 MG PO CAPS
300.0000 mg | ORAL_CAPSULE | Freq: Three times a day (TID) | ORAL | Status: DC
  Administered 2017-05-11: 300 mg via ORAL
  Filled 2017-05-11: qty 1

## 2017-05-11 MED ORDER — GUAIFENESIN ER 600 MG PO TB12: 600.0000 mg | ORAL_TABLET | Freq: Two times a day (BID) | ORAL | 0 refills | Status: DC

## 2017-05-11 MED ORDER — FUROSEMIDE 40 MG PO TABS: 40.0000 mg | ORAL_TABLET | Freq: Every day | ORAL | Status: DC

## 2017-05-11 MED ORDER — PREDNISONE 20 MG PO TABS: 40.0000 mg | ORAL_TABLET | Freq: Every day | ORAL | Status: DC

## 2017-05-11 MED ORDER — IPRATROPIUM-ALBUTEROL 0.5-2.5 (3) MG/3ML IN SOLN
3.0000 mL | Freq: Three times a day (TID) | RESPIRATORY_TRACT | Status: DC
  Filled 2017-05-11: qty 3

## 2017-05-11 MED ORDER — GABAPENTIN 300 MG PO CAPS: 600.0000 mg | ORAL_CAPSULE | Freq: Every day | ORAL | Status: DC

## 2017-05-11 MED ORDER — NICOTINE 14 MG/24HR TD PT24: 14.0000 mg | MEDICATED_PATCH | Freq: Every day | TRANSDERMAL | 0 refills | Status: DC

## 2017-05-11 MED ORDER — FLUCONAZOLE 200 MG PO TABS: 200.0000 mg | ORAL_TABLET | Freq: Every day | ORAL | 0 refills | Status: DC

## 2017-05-11 NOTE — Progress Notes (Signed)
Patient refused CPAP. States she is unable to wear due to being claustrophobic. Equipment not in room. Remains on 2L [place]

## 2017-05-11 NOTE — Discharge Summary (Signed)
Physician Discharge Summary  Diane Gilbert ZOX:[id number] DOB: 02/07/1966 DOA: 05/09/2017  PCP: Nolene Ebbs, MD  Admit date: 05/09/2017 Discharge date: 05/11/2017   Recommendations for Outpatient Follow-Up:   1. BMP 1 week-- resumed lasix-- patient was not taking-- last echo showed grade 2 diastolic CHF 2.  Still smoking- encouraged cessation 3.  Not compliant with CPAP due to claustrophobia ? Different mask   Discharge Diagnosis:   Principal Problem:   Acute on chronic respiratory failure with hypoxia (HCC) Active Problems:   Alcohol abuse   Smoking addiction   Schizophrenia (HCC)   OSA (obstructive sleep apnea)   Acute on chronic diastolic CHF (congestive heart failure) (HCC)   Respiratory failure, acute and chronic [facility])   Discharge disposition:  Home  Discharge Condition: Improved.  Diet recommendation: Low sodium, heart healthy.  Carbohydrate-modified  Wound care: None.   History of Present Illness:   Diane Gilbert  is a 51 y.o. female past medical history relevant for schizophrenia/depression, noncompliance with medical therapy, obesity with OSA, chronic diastolic dysfunction CHF, COPD and ongoing tobacco use disorder, as well as chronic hypoxic respiratory failure with 2 L of oxygen via nasal cannula at home who now presents with worsening shortness of breath, requiring up to 4 L of oxygen here.  Patient states she has had chills, low-grade temps T-max 100.3, cough with scant amount of yellow to tan sputum as well as fatigue malaise.   She has shortness of breath, dyspnea on exertion and slight lower extremity edema,, no leg pains or pleuritic symptoms, patient endorses mild orthopnea but no paroxysmal nocturnal dyspnea  Patient had some post-tussive emesis, emesis was without bile or blood,  she did have couple episodes of diarrhea, stools were without blood or mucus.  Denies chest pains palpitations dizziness  . 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 [place] Bells on  11/14 ? Exacerbation-- PO steroids x 5 days -doxy PO x 5 days  Acute on chronic diastolic CHF-no frank ACS type symptoms -has diuresed about 4L with IV Lasix and has improved  Acute onChronicHypoxicRespiratoryFailure-worsening hypoxia secondary to CHF-- wean back to baseline  TobaccoAbuse-patient admits to smoking about a pack and a half a day, smoking cessation strongly advised,given nicotine patch  AlcoholAbuse-last alcoholic intake within the last 24 hours -no signs of withdrawal  Morbid Obesity/OSA-CPAP nightly-- has not been using due to claustophobia  Psych/history of depression and schizophrenia-stable at this time, continue home meds  Tinea Corporis/Candida intertrigo-Diflucan p.o., topical nystatin/mycolog cream,erythematous macularrashwith satellitelesionssuggestive of tinea/candidal intertrigo        Medical Consultants:    None.   Discharge Exam:   Vitals:   05/11/17 0832 05/11/17 0843  BP:  120/74  Pulse:  76  Resp: 18 16  Temp:  97.6 F (36.4 C)  SpO2: 92% 95%   Vitals:   05/11/17 0100 05/11/17 0431 05/11/17 0832 05/11/17 0843  BP: 137/74 (!) 110/58  120/74  Pulse: 85 69  76  Resp: _0 Temp: 98.6 F (37 C) 97.7 F (36.5 C)  97.6 F (36.4 C)  TempSrc: Oral Oral  Oral  SpO2: 96% 94% 92% 95%  Weight:  114.8 kg (253 lb)    Height:        Gen:  NAD    The results of significant diagnostics from this hospitalization (including imaging,  microbiology, ancillary and laboratory) are listed below for reference.     Procedures and Diagnostic Studies:   Dg Chest 2 View  Result Date: 05/09/2017 CLINICAL DATA:  Cough and fever EXAM: CHEST  2 VIEW COMPARISON:  Chest CT January 20, 2017 and chest radiograph January 20, 2017 FINDINGS: There is no edema or consolidation. Heart  is enlarged with pulmonary venous hypertension. No adenopathy. No evident bone lesions. IMPRESSION: Evidence of pulmonary vascular congestion without edema or consolidation. Electronically Signed   By: Lowella Grip III M.D.   On: 05/09/2017 12:39     Labs:   Basic Metabolic Panel: Recent Labs  Lab 05/09/17 1201 05/10/17 0059 05/11/17 0511  NA 137 136 139  K 4.7 5.3* 4.1  CL 100* 93* 92*  CO2 30 36* 40*  GLUCOSE 98 133* 105*  BUN _0 CREATININE 0.72 0.97 0.90  CALCIUM 8.8* 9.3 9.6   GFR Estimated Creatinine Clearance: 95 mL/min (by C-G formula based on SCr of 0.9 mg/dL). Liver Function Tests: No results for input(s): AST, ALT, ALKPHOS, BILITOT, PROT, ALBUMIN in the last 168 hours. No results for input(s): LIPASE, AMYLASE in the last 168 hours. No results for input(s): AMMONIA in the last 168 hours. Coagulation profile No results for input(s): INR, PROTIME in the last 168 hours.  CBC: Recent Labs  Lab 05/09/17 1201 05/11/17 0511  WBC 9.5 10.1  HGB 15.1* 15.9*  HCT 50.8* 54.2*  MCV 83.3 84.2  PLT 171 174   Cardiac Enzymes: Recent Labs  Lab 05/09/17 1825 05/10/17 0103  TROPONINI 0.04* <0.03   BNP: Invalid input(s): POCBNP CBG: No results for input(s): GLUCAP in the last 168 hours. D-Dimer No results for input(s): DDIMER in the last 72 hours. Hgb A1c No results for input(s): HGBA1C in the last 72 hours. Lipid Profile No results for input(s): CHOL, HDL, LDLCALC, TRIG, CHOLHDL, LDLDIRECT in the last 72 hours. Thyroid function studies No results for input(s): TSH, T4TOTAL, T3FREE, THYROIDAB in the last 72 hours.  Invalid input(s): FREET3 Anemia work up No results for input(s): VITAMINB12, FOLATE, FERRITIN, TIBC, IRON, RETICCTPCT in the last 72 hours. Microbiology No results found for this or any previous visit (from the past 240 hour(s)).   Discharge Instructions:   Discharge Instructions    Diet - low sodium heart healthy   Complete by:  As  directed    Discharge instructions   Complete by:  As directed    Wear CPAP and if unable, speak with pulm regarding different masks -STOP SMOKING -avoid alcohol   Increase activity slowly   Complete by:  As directed      Allergies as of 05/11/2017   No Known Allergies     Medication List    STOP taking these medications   furosemide 40 MG tablet Commonly known as:  LASIX     TAKE these medications   acetaminophen 325 MG tablet Commonly known as:  TYLENOL Take 2 tablets (650 mg total) by mouth every 6 (six) hours as needed for mild pain (or Fever >/= 101).   albuterol 108 (90 Base) MCG/ACT inhaler Commonly known as:  PROVENTIL HFA;VENTOLIN HFA Inhale 2 puffs into the lungs every 6 (six) hours as needed for wheezing or shortness of breath.   amLODipine 5 MG tablet Commonly known as:  NORVASC Take 5 mg daily by mouth.   ARIPiprazole 20 MG tablet Commonly known as:  ABILIFY Take 20 mg by mouth daily.   budesonide-formoterol 160-4.5 MCG/ACT inhaler Commonly  known as:  SYMBICORT Inhale 2 puffs into the lungs 2 (two) times daily.   buPROPion 150 MG 12 hr tablet Commonly known as:  WELLBUTRIN SR Take 150 mg by mouth See admin instructions. Take 1 tablet (150 mg) by mouth every morning and at noon   doxycycline 100 MG tablet Commonly known as:  VIBRA-TABS Take 1 tablet (100 mg total) every 12 (twelve) hours by mouth.   fluconazole 200 MG tablet Commonly known as:  DIFLUCAN Take 1 tablet (200 mg total) daily by mouth.   gabapentin 300 MG capsule Commonly known as:  NEURONTIN Take 300-600 mg See admin instructions by mouth. 353m by mouth three times daily, 6035mat bedtime   guaiFENesin 600 MG 12 hr tablet Commonly known as:  MUCINEX Take 1 tablet (600 mg total) 2 (two) times daily by mouth.   nicotine 14 mg/24hr patch Commonly known as:  NICODERM CQ - dosed in mg/24 hours Place 1 patch (14 mg total) daily onto the skin.   oxyCODONE-acetaminophen 5-325 MG  tablet Commonly known as:  PERCOCET/ROXICET Take 1 tablet every 6 (six) hours as needed by mouth for pain.   OXYGEN Inhale 2 L into the lungs continuous.   predniSONE 20 MG tablet Commonly known as:  DELTASONE 40 mg x 5 days then stop   QUEtiapine 300 MG tablet Commonly known as:  SEROQUEL Take 300 mg at bedtime by mouth.   traZODone 100 MG tablet Commonly known as:  DESYREL Take 100 mg by mouth at bedtime.      Follow-up Information    AvNolene EbbsMD Follow up in 1 week(s).   Specialty:  Internal Medicine Contact information: [phone]      keep appointment with Pulm.        AvNolene EbbsMD On 05/17/2017.   Specialty:  Internal Medicine Why:  At 9:15 AM Contact information: [phone]        AvNolene EbbsMD.   Specialty:  Internal Medicine Contact information: [name] [place] [phone]          Time coordinating discharge: 35 min  Signed:  Cherae Marton U Alison Stalling [facility] 05/12/2017, 2:42 PM

## 2017-05-11 NOTE — Progress Notes (Signed)
SATURATION QUALIFICATIONS: (This note is used to comply with regulatory documentation for home oxygen)  Patient Saturations on Room Air at Rest = 84%  Patient Saturations on Room Air while Ambulating = 80%  Patient Saturations on 2 Liters of oxygen while Ambulating = 87%  Please briefly explain why patient needs home oxygen:

## 2017-05-12 ENCOUNTER — Encounter: Payer: Self-pay | Admitting: Pulmonary Disease

## 2017-05-12 ENCOUNTER — Ambulatory Visit: Payer: Medicaid Other | Admitting: Pulmonary Disease

## 2017-05-12 VITALS — BP 142/90 | HR 97 | Ht 63.0 in | Wt 241.0 lb

## 2017-05-12 DIAGNOSIS — J84115 Respiratory bronchiolitis interstitial lung disease: Secondary | ICD-10-CM | POA: Diagnosis not present

## 2017-05-12 DIAGNOSIS — J9611 Chronic respiratory failure with hypoxia: Secondary | ICD-10-CM

## 2017-05-12 DIAGNOSIS — G4733 Obstructive sleep apnea (adult) (pediatric): Secondary | ICD-10-CM

## 2017-05-12 MED ORDER — FUROSEMIDE 40 MG PO TABS: 40.0000 mg | ORAL_TABLET | Freq: Every day | ORAL | 0 refills | Status: DC

## 2017-05-12 NOTE — Progress Notes (Signed)
Subjective:    Patient ID: Diane Gilbert, female    DOB: 01-18-1966, 51 y.o.   MRN: [id number]  Former patient of Dr. Gwenette Greet with respiratory bronchiolitis interstitial lung disease   HPI Chief Complaint  Patient presents with  . Follow-up    pt recently hospitalized for chf.  pt c/o fatigue, sob, prod cough with white mucus.    Mel Almond says that she was hospitalized for "fluid on her lungs" and she was treated with lasix. She is feeling better, though she is a little "queezy".  She has been coughing, sometimes productive of white phlegm. She had some swelling in her legs.   She feels a little better now. She says that her symptoms after the flu shot. She is still smoking 2-3 cigarettes a day.  She has not been weighing herself. She continues to use 2 L of oxygen at rest, 4 L when she walks around.  Past Medical History:  Diagnosis Date  . Alcohol abuse   . Anxiety   . Arthritis   . Depression   . Headache(784.0)   . Hypertension   . Insomnia   . Interstitial lung disease ([place])   . Oxygen dependent   . Schizophrenia ([place])   . Shortness of breath dyspnea   . Sleep apnea   . Sleep apnea       Review of Systems  Constitutional: Negative for chills, fatigue and fever.  HENT: Negative for hearing loss, postnasal drip and rhinorrhea.   Respiratory: Positive for cough and shortness of breath. Negative for wheezing.   Cardiovascular: Negative for chest pain, palpitations and leg swelling.       Objective:   Physical Exam Vitals:   05/12/17 1417  BP: (!) 142/90  Pulse: 97  SpO2: 91%  Weight: 241 lb (109.3 kg)  Height: _0  (1.6 m)   2 L [place]  Gen: well appearing HENT: OP clear, TM's clear, neck supple PULM: Crackles in bases bilaterally B, normal percussion CV: RRR, no mgr, trace edema GI: BS+, soft, nontender Derm: no cyanosis or rash Psyche: normal mood and affect     CBC    Component Value Date/Time   WBC 10.1 05/11/2017 0511   RBC 6.44  (H) 05/11/2017 0511   HGB 15.9 (H) 05/11/2017 0511   HCT 54.2 (H) 05/11/2017 0511   PLT 174 05/11/2017 0511   MCV 84.2 05/11/2017 0511   MCH 24.7 (L) 05/11/2017 0511   MCHC 29.3 (L) 05/11/2017 0511   RDW 18.1 (H) 05/11/2017 0511   LYMPHSABS 6.2 (H) 03/23/2017 1842   MONOABS 1.1 (H) 03/23/2017 1842   EOSABS 0.3 03/23/2017 1842   BASOSABS 0.0 03/23/2017 1842    Chest imaging: HRCT 12/2013:  Mild GG, centrilobular nodularity  PFT PFTs 2015:  FEV1 1.48 (56%), but normal ratio.  No BD response.  TLC 60%, mild airtrapping.  DLCO 45%, but corrects to normal with Av March 2017 pulmonary function testing ratio 85%, FVC 1.61 L (50% predicted) sees, FEV1 1.37 L (53% predicted), total lung capacity 3.14 L (58% predicted), DLCO 13.3 (49% predicted).  Path: Bronch 03/2014:  BAL with 85 WBC's, 21% eos.  TBBX with normal lung parenchyma, but not enough for possible specific diagnosis VATS BX 06/2014:  RB-ILD (severe), ??? Component NSIP included in the differential  Labs: HP panel negative 03/2014 Autoimmune labs normal 07/2014  6MW March 2017 6 minute walk distance 144 m, O2 saturation nadir 81% on room air  Hospital records reviewed: She was discharged  yesterday for acute on chronic hypoxemic respiratory failure.  She was noted to need 2 L of oxygen at rest and 4L on exertion.  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  So it would not surprise me if she had a combination of both congestive heart failure as well as a flareup of her underlying lung disease.  If she would quit smoking this would improve.  She does seem to be improving somewhat  today though she does not have a prescription for Lasix and she has not been weighing herself daily.  I think we need to keep a close eye on her son going to ask her to come back in 5 days to make sure she is continuing to get better.  I did counsel her today on the importance of using her Lasix daily and to write her weight down every day.  Plan: Congestive heart failure: Take Lasix daily Weigh yourself every day Come back in 5 days and shows her daily weights  Chronic respiratory failure with hypoxemia: Keep using 2 L of oxygen at rest, 4 L with exertion  History of respiratory bronchiolitis interstitial lung disease: As we have discussed previously, this is due only to ongoing tobacco use Stop smoking right away Finish the prednisone prescribed during the hospital visit  Cigarette smoking: Stop smoking  Obstructive sleep apnea Try using her CPAP machine at home for the next 5 days If you are still having difficulty with this let us know when you come back and see [place] in 5 days and we can consider changing the mask  Follow-up in 5 days with a nurse practitioner    Current Outpatient Medications:  .  acetaminophen (TYLENOL) 325 MG tablet, Take 2 tablets (650 mg total) by mouth every 6 (six) hours as needed for mild pain (or Fever >/= 101)., Disp: , Rfl:  .  albuterol (PROVENTIL HFA;VENTOLIN HFA) 108 (90 BASE) MCG/ACT inhaler, Inhale 2 puffs into the lungs every 6 (six) hours as needed for wheezing or shortness of breath., Disp: 1 Inhaler, Rfl: 3 .  amLODipine (NORVASC) 5 MG tablet, Take 5 mg daily by mouth., Disp: , Rfl:  .  ARIPiprazole (ABILIFY) 20 MG tablet, Take 20 mg by mouth daily., Disp: , Rfl:  .  budesonide-formoterol (SYMBICORT) 160-4.5 MCG/ACT inhaler, Inhale 2 puffs into the lungs 2 (two) times daily., Disp: 1 Inhaler, Rfl: 5 .  buPROPion (WELLBUTRIN SR) 150 MG 12 hr tablet, Take 150 mg by mouth See admin instructions. Take 1 tablet (150 mg) by mouth every morning and at  noon, Disp: , Rfl:  .  doxycycline (VIBRA-TABS) 100 MG tablet, Take 1 tablet (100 mg total) every 12 (twelve) hours by mouth., Disp: 6 tablet, Rfl: 0 .  fluconazole (DIFLUCAN) 200 MG tablet, Take 1 tablet (200 mg total) daily by mouth., Disp: 3 tablet, Rfl: 0 .  furosemide (LASIX) 40 MG tablet, Take 1 tablet (40 mg total) daily by mouth., Disp: 30 tablet, Rfl: 0 .  gabapentin (NEURONTIN) 300 MG capsule, Take 300-600 mg See admin instructions by mouth. 34m by mouth three times daily, 6050mat bedtime, Disp: , Rfl:  .  guaiFENesin (MUCINEX) 600  MG 12 hr tablet, Take 1 tablet (600 mg total) 2 (two) times daily by mouth., Disp: 10 tablet, Rfl: 0 .  nicotine (NICODERM CQ - DOSED IN MG/24 HOURS) 14 mg/24hr patch, Place 1 patch (14 mg total) daily onto the skin., Disp: 28 patch, Rfl: 0 .  oxyCODONE-acetaminophen (PERCOCET/ROXICET) 5-325 MG tablet, Take 1 tablet every 6 (six) hours as needed by mouth for pain., Disp: , Rfl: 0 .  OXYGEN, Inhale 2 L into the lungs continuous., Disp: , Rfl:  .  predniSONE (DELTASONE) 20 MG tablet, 40 mg x 5 days then stop, Disp: 10 tablet, Rfl: 0 .  QUEtiapine (SEROQUEL) 300 MG tablet, Take 300 mg at bedtime by mouth., Disp: , Rfl:  .  traZODone (DESYREL) 100 MG tablet, Take 100 mg by mouth at bedtime. , Disp: , Rfl:

## 2017-05-12 NOTE — Patient Instructions (Signed)
Congestive heart failure: Take Lasix daily Weigh yourself every day Come back in 5 days and shows her daily weights  Chronic respiratory failure with hypoxemia: Keep using 2 L of oxygen at rest, 4 L with exertion  History of respiratory bronchiolitis interstitial lung disease: As we have discussed previously, this is due only to ongoing tobacco use Stop smoking right away Finish the prednisone prescribed during the hospital visit  Cigarette smoking: Stop smoking  Obstructive sleep apnea Try using her CPAP machine at home for the next 5 days If you are still having difficulty with this let us know when you come back and see [place] in 5 days and we can consider changing the mask  Follow-up in 5 days with a nurse practitioner

## 2017-05-18 ENCOUNTER — Ambulatory Visit: Payer: Medicaid Other | Admitting: Adult Health

## 2017-05-19 ENCOUNTER — Ambulatory Visit (HOSPITAL_COMMUNITY): Payer: Medicaid Other | Admitting: Psychiatry

## 2017-05-26 ENCOUNTER — Ambulatory Visit: Payer: Medicaid Other | Admitting: Adult Health

## 2017-06-04 ENCOUNTER — Ambulatory Visit: Payer: Medicaid Other | Admitting: Adult Health

## 2017-06-09 ENCOUNTER — Ambulatory Visit: Payer: Medicaid Other | Admitting: Adult Health

## 2017-06-11 ENCOUNTER — Ambulatory Visit (INDEPENDENT_AMBULATORY_CARE_PROVIDER_SITE_OTHER): Payer: Medicaid Other | Admitting: Adult Health

## 2017-06-11 ENCOUNTER — Telehealth: Payer: Self-pay | Admitting: Adult Health

## 2017-06-11 ENCOUNTER — Other Ambulatory Visit (INDEPENDENT_AMBULATORY_CARE_PROVIDER_SITE_OTHER): Payer: Medicaid Other

## 2017-06-11 ENCOUNTER — Encounter: Payer: Self-pay | Admitting: Adult Health

## 2017-06-11 VITALS — BP 106/64 | HR 85 | Ht 64.0 in | Wt 245.2 lb

## 2017-06-11 DIAGNOSIS — G4733 Obstructive sleep apnea (adult) (pediatric): Secondary | ICD-10-CM

## 2017-06-11 DIAGNOSIS — J84115 Respiratory bronchiolitis interstitial lung disease: Secondary | ICD-10-CM | POA: Diagnosis not present

## 2017-06-11 DIAGNOSIS — J9611 Chronic respiratory failure with hypoxia: Secondary | ICD-10-CM

## 2017-06-11 DIAGNOSIS — I5033 Acute on chronic diastolic (congestive) heart failure: Secondary | ICD-10-CM | POA: Diagnosis not present

## 2017-06-11 LAB — BASIC METABOLIC PANEL
BUN: 9 mg/dL (ref 6–23)
CHLORIDE: 97 meq/L (ref 96–112)
CO2: 38 meq/L — AB (ref 19–32)
Calcium: 8.8 mg/dL (ref 8.4–10.5)
Creatinine, Ser: 0.89 mg/dL (ref 0.40–1.20)
GFR: 86.67 mL/min (ref >60.00)
GLUCOSE: 120 mg/dL — AB (ref 70–99)
POTASSIUM: 4.2 meq/L (ref 3.5–5.1)
SODIUM: 139 meq/L (ref 135–145)

## 2017-06-11 MED ORDER — FUROSEMIDE 20 MG PO TABS: 20.0000 mg | ORAL_TABLET | Freq: Every day | ORAL | 1 refills | Status: DC

## 2017-06-11 NOTE — Assessment & Plan Note (Signed)
Mild OSA on sleep study 2015 -  She is on numerous sedating rx , advised to use with caution  Try to get CPAP machine for pt.   Plan   Patient Instructions  Restart Lasix 42m daily .  Labs today .  Work on not smoking .  Try to restart CPAP At bedtime  - try new nasal mask .  Order to DME for new CPAP .  Avoid taking medications that cause sleepiness.  Continue on Oxygen 2l/m at rest and 4l/m with activity  Do not drive if sleepy.  Follow up with Dr. MLake Bellsin 4-6 weeks and As needed   Please contact office for sooner follow up if symptoms do not improve or worsen or seek emergency care

## 2017-06-11 NOTE — Progress Notes (Signed)
_0  ID: Diane Gilbert, female    DOB: [birth year], 51 y.o.   MRN: [id number]  Chief Complaint  Patient presents with  . 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 No frank abscess. . Soft tissue swelling along each lower to mid face without hematoma or abscess.   3. Several lymph nodes inferior to the mandible may have inflammatory etiology. No frank adenopathy by size criteria.. Foci of paranasal sinus disease as described. Note that there is obstruction of the right nasal cavity due to edema and likely underlying polypoid change. Ostiomeatal unit complexes are patent bilaterally, although there is narrowing of the ostiomeatal unit complex on the right due to edema.  02/2017  CT chest mild to moderate emphysema , stable GGO opacities/ILD changes. [place] R>L lymph nodes.    PFT 2015: FEV1 1.48 (56%), but normal ratio. No BD response. TLC 60%, mild airtrapping. DLCO 45%, but corrects to normal with Av March 2017 pulmonary function testing ratio 85%, FVC 1.61 L (50% predicted) sees, FEV1 1.37 L (53% predicted), total lung capacity 3.14 L (58% predicted), DLCO 13.3 (49% predicted). May 2018 pulmonary function testing ratio 87%, FEV1 1.18 L 50% predicted, FVC 1.36 L 46% predicted, total lung capacity 3.02 L 59% predicted, DLCO 12.65 mL per Min per millimeters of mercury 52% predicted  Path Bronch 03/2014: BAL with 85 WBC's, 21% eos. TBBX with normal lung parenchyma, but not enough for possible specific diagnosis VATS BX 06/2014: RB-ILD (severe), ??? Component NSIP included in the differential  Labs: HP panel negative 03/2014 Autoimmune labs normal 07/2014  6MW March 2017 6 minute walk distance 144 m, O2  saturation nadir 81% on room air April 2018 6 minute walk distance 144 m, O2 saturation nadir 87% on room air  Echo 03/2016 EF ok , Gr 2 DD.    06/11/2017 Follow up ; RB-ILD , O2 RF  Patient returns for a one-month follow-up. Last visit with increased fluid retention . She was given 1 week of Lasix 58m daily. Says it helped for a while but seems to be retaining fluid again.   Patient remains on oxygen 2 L at rest and 4 L with activity.  She has diastolic congestive heart failure.  She continues to smoke.  Smoking cessation was discussed. Has increased smoking recently . Advised on dangers of smoking .   She has underlying obstructive sleep apnea.  She was restarted on CPAP last ov , but says she did not restart. We discussed need to use on consistent basis .  She says she does not know where her machine is .  She is on several sedating medications , discussed dangers of using these meds with sleep apnea. Advised to use extreme caution with pain and sleep meds.  She has lots of daytime sleepiness.  We discussed alternative mask. She is willing to try nasal mask if we can get her a CPAP machine   She says she does cocaine and etoh some. Advised on cessation and dangers of ongoing use.      No Known Allergies  Immunization History  Administered Date(s) Administered  . Influenza Split 04/03/2016, 04/30/2017  . Influenza,inj,Quad PF,6+ Mos 03/12/2013, 06/07/2014, 05/13/2015    Past Medical History:  Diagnosis Date  .  Alcohol abuse   . Anxiety   . Arthritis   . Depression   . Headache(784.0)   . Hypertension   . Insomnia   . Interstitial lung disease ([place])   . Oxygen dependent   . Schizophrenia ([place])   . Shortness of breath dyspnea   . Sleep apnea   . Sleep apnea     Tobacco History: Social History   Tobacco Use  Smoking Status Current Some Day Smoker  . Packs/day: 1.50  . Years: 30.00  . Pack years: 45.00  . Types: Cigarettes  Smokeless Tobacco Never Used    Tobacco Comment   smoking 3 cigarettes daily "every now and then" 11/04/16   Ready to quit: No Counseling given: Yes Comment: smoking 3 cigarettes daily "every now and then" 11/04/16   Outpatient Encounter Medications as of 06/11/2017  Medication Sig  . acetaminophen (TYLENOL) 325 MG tablet Take 2 tablets (650 mg total) by mouth every 6 (six) hours as needed for mild pain (or Fever >/= 101).  Marland Kitchen albuterol (PROVENTIL HFA;VENTOLIN HFA) 108 (90 BASE) MCG/ACT inhaler Inhale 2 puffs into the lungs every 6 (six) hours as needed for wheezing or shortness of breath.  Marland Kitchen amLODipine (NORVASC) 5 MG tablet Take 5 mg daily by mouth.  . ARIPiprazole (ABILIFY) 20 MG tablet Take 20 mg by mouth daily.  . budesonide-formoterol (SYMBICORT) 160-4.5 MCG/ACT inhaler Inhale 2 puffs into the lungs 2 (two) times daily.  Marland Kitchen buPROPion (WELLBUTRIN SR) 150 MG 12 hr tablet Take 150 mg by mouth See admin instructions. Take 1 tablet (150 mg) by mouth every morning and at noon  . furosemide (LASIX) 20 MG tablet Take 1 tablet (20 mg total) by mouth daily.  Marland Kitchen gabapentin (NEURONTIN) 300 MG capsule Take 300-600 mg See admin instructions by mouth. 357m by mouth three times daily, 6016mat bedtime  . guaiFENesin (MUCINEX) 600 MG 12 hr tablet Take 1 tablet (600 mg total) 2 (two) times daily by mouth.  . OXYGEN Inhale 2 L into the lungs continuous.  . Marland KitchenUEtiapine (SEROQUEL) 300 MG tablet Take 300 mg at bedtime by mouth.  . traZODone (DESYREL) 100 MG tablet Take 100 mg by mouth at bedtime.   . [DISCONTINUED] furosemide (LASIX) 40 MG tablet Take 1 tablet (40 mg total) daily by mouth.  . nicotine (NICODERM CQ - DOSED IN MG/24 HOURS) 14 mg/24hr patch Place 1 patch (14 mg total) daily onto the skin. (Patient not taking: Reported on 06/11/2017)  . oxyCODONE-acetaminophen (PERCOCET/ROXICET) 5-325 MG tablet Take 1 tablet every 6 (six) hours as needed by mouth for pain.  . predniSONE (DELTASONE) 20 MG tablet 40 mg x 5 days then stop  .  [DISCONTINUED] doxycycline (VIBRA-TABS) 100 MG tablet Take 1 tablet (100 mg total) every 12 (twelve) hours by mouth. (Patient not taking: Reported on 06/11/2017)  . [DISCONTINUED] fluconazole (DIFLUCAN) 200 MG tablet Take 1 tablet (200 mg total) daily by mouth. (Patient not taking: Reported on 06/11/2017)   No facility-administered encounter medications on file as of 06/11/2017.      Review of Systems  Constitutional:   No  weight loss, night sweats,  Fevers, chills,  +fatigue, or  lassitude.  HEENT:   No headaches,  Difficulty swallowing,  Tooth/dental problems, or  Sore throat,                No sneezing, itching, ear ache, nasal congestion, post nasal drip,   CV:  No chest pain,  Orthopnea, PND, +swelling in lower extremities,  No anasarca, dizziness, palpitations, syncope.   GI  No heartburn, indigestion, abdominal pain, nausea, vomiting, diarrhea, change in bowel habits, loss of appetite, bloody stools.   Resp: .  No excess mucus, no productive cough,  No non-productive cough,  No coughing up of blood.  No change in color of mucus.  No wheezing.  No chest wall deformity  Skin: no rash or lesions.  GU: no dysuria, change in color of urine, no urgency or frequency.  No flank pain, no hematuria   MS:  No joint pain or swelling.  No decreased range of motion.  No back pain.    Physical Exam  BP 106/64 (BP Location: Left Arm, Cuff Size: Large)   Pulse 85   Ht _0  (1.626 m)   Wt 245 lb 3.2 oz (111.2 kg)   SpO2 97%   BMI 42.09 kg/m   GEN: A/Ox3; pleasant , NAD, obese    HEENT:  [place]/AT,  EACs-clear, TMs-wnl, NOSE-clear, THROAT-clear, no lesions, no postnasal drip or exudate noted.  Class 3 MP airway   NECK:  Supple w/ fair ROM; no JVD; normal carotid impulses w/o bruits; no thyromegaly or nodules palpated; no lymphadenopathy.    RESP  Clear  P & A; w/o, wheezes/ rales/ or rhonchi. no accessory muscle use, no dullness to percussion  CARD:  RRR, no m/r/g, tr peripheral  edema, pulses intact, no cyanosis or clubbing.  GI:   Soft & nt; nml bowel sounds; no organomegaly or masses detected.   Musco: Warm bil, no deformities or joint swelling noted.   Neuro: alert, no focal deficits noted.    Skin: Warm, no lesions or rashes    Lab Results:  CBC  Imaging: No results found.   Assessment & Plan:   OSA (obstructive sleep apnea) Mild OSA on sleep study 2015 -  She is on numerous sedating rx , advised to use with caution  Try to get CPAP machine for pt.   Plan   Patient Instructions  Restart Lasix 45m daily .  Labs today .  Work on not smoking .  Try to restart CPAP At bedtime  - try new nasal mask .  Order to DME for new CPAP .  Avoid taking medications that cause sleepiness.  Continue on Oxygen 2l/m at rest and 4l/m with activity  Do not drive if sleepy.  Follow up with Dr. MLake Bellsin 4-6 weeks and As needed   Please contact office for sooner follow up if symptoms do not improve or worsen or seek emergency care        Chronic respiratory failure with hypoxia ([facility] Cont on O2 , goal to keep sat >88-90%.   Plan  Cont on O2.   Respiratory bronchiolitis interstitial lung disease ([place] Smoking cessation is key  Long discussion with pt  Pt does also use etoh and cocaine, advised on ceessation as well.   Acute on chronic diastolic CHF (congestive heart failure) (H[facility]) Recent decompensation  Appears to be improved on lasix  Will restart at low dose to avoid fluid overload Check bmet today   Plan Patient Instructions  Restart Lasix 225mdaily .  Labs today .  Work on not smoking .  Try to restart CPAP At bedtime  - try new nasal mask .  Order to DME for new CPAP .  Avoid taking medications that cause sleepiness.  Continue on Oxygen 2l/m at rest and 4l/m with activity  Do not drive if sleepy.  Follow up  with Dr. [place] Bells in 4-6 weeks and As needed   Please contact office for sooner follow up if symptoms do not improve or worsen  or seek emergency care           Rexene Edison, NP 06/11/2017

## 2017-06-11 NOTE — Patient Instructions (Addendum)
Restart Lasix 68m daily .  Labs today .  Work on not smoking .  Try to restart CPAP At bedtime  - try new nasal mask .  Order to DME for new CPAP .  Avoid taking medications that cause sleepiness.  Continue on Oxygen 2l/m at rest and 4l/m with activity  Do not drive if sleepy.  Follow up with Dr. MLake Bellsin 4-6 weeks and As needed   Please contact office for sooner follow up if symptoms do not improve or worsen or seek emergency care

## 2017-06-11 NOTE — Assessment & Plan Note (Signed)
Smoking cessation is key  Long discussion with pt  Pt does also use etoh and cocaine, advised on ceessation as well.

## 2017-06-11 NOTE — Assessment & Plan Note (Signed)
Cont on O2 , goal to keep sat >88-90%.   Plan  Cont on O2.

## 2017-06-11 NOTE — Assessment & Plan Note (Signed)
Recent decompensation  Appears to be improved on lasix  Will restart at low dose to avoid fluid overload Check bmet today   Plan Patient Instructions  Restart Lasix 55m daily .  Labs today .  Work on not smoking .  Try to restart CPAP At bedtime  - try new nasal mask .  Order to DME for new CPAP .  Avoid taking medications that cause sleepiness.  Continue on Oxygen 2l/m at rest and 4l/m with activity  Do not drive if sleepy.  Follow up with Dr. MLake Bellsin 4-6 weeks and As needed   Please contact office for sooner follow up if symptoms do not improve or worsen or seek emergency care

## 2017-06-11 NOTE — Telephone Encounter (Signed)
Will have to call back Monday since it is after 5pm.

## 2017-06-14 NOTE — Telephone Encounter (Signed)
ATC pt, no answer. Left message for pt to call back.  I wanted to let pt know she needs a new sleep study before I send to the provider. I want to make sure she is going to be ok with doing the test again.

## 2017-06-15 NOTE — Progress Notes (Signed)
Reviewed, agree

## 2017-06-15 NOTE — Telephone Encounter (Signed)
lmtcb for Diane Gilbert at [facility] to see why a new sleep study is needed.

## 2017-06-17 NOTE — Telephone Encounter (Signed)
lmtcb x1 for [place] with [facility].

## 2017-06-17 NOTE — Telephone Encounter (Signed)
Called Diane Gilbert at [facility], states that because there was a break in her cpap therapy, her insurance requires her to have a new qualifying sleep study for cpap.   BQ please advise what sleep study you'd like to order.  Thanks .

## 2017-06-17 NOTE — Telephone Encounter (Signed)
OK to order Needs in lab study

## 2017-06-18 ENCOUNTER — Encounter (HOSPITAL_COMMUNITY): Payer: Self-pay | Admitting: Psychiatry

## 2017-06-18 ENCOUNTER — Encounter (INDEPENDENT_AMBULATORY_CARE_PROVIDER_SITE_OTHER): Payer: Self-pay

## 2017-06-18 ENCOUNTER — Ambulatory Visit (INDEPENDENT_AMBULATORY_CARE_PROVIDER_SITE_OTHER): Payer: Medicaid Other | Admitting: Psychiatry

## 2017-06-18 ENCOUNTER — Ambulatory Visit (HOSPITAL_COMMUNITY): Payer: Medicaid Other | Admitting: Psychiatry

## 2017-06-18 VITALS — BP 140/80 | HR 103 | Ht 64.0 in | Wt 248.0 lb

## 2017-06-18 DIAGNOSIS — I509 Heart failure, unspecified: Secondary | ICD-10-CM | POA: Diagnosis not present

## 2017-06-18 DIAGNOSIS — F17210 Nicotine dependence, cigarettes, uncomplicated: Secondary | ICD-10-CM

## 2017-06-18 DIAGNOSIS — G4733 Obstructive sleep apnea (adult) (pediatric): Secondary | ICD-10-CM | POA: Diagnosis not present

## 2017-06-18 DIAGNOSIS — Z9981 Dependence on supplemental oxygen: Secondary | ICD-10-CM | POA: Diagnosis not present

## 2017-06-18 DIAGNOSIS — J449 Chronic obstructive pulmonary disease, unspecified: Secondary | ICD-10-CM

## 2017-06-18 DIAGNOSIS — Z79899 Other long term (current) drug therapy: Secondary | ICD-10-CM

## 2017-06-18 DIAGNOSIS — G8929 Other chronic pain: Secondary | ICD-10-CM

## 2017-06-18 DIAGNOSIS — F251 Schizoaffective disorder, depressive type: Secondary | ICD-10-CM

## 2017-06-18 DIAGNOSIS — F1011 Alcohol abuse, in remission: Secondary | ICD-10-CM

## 2017-06-18 DIAGNOSIS — F1490 Cocaine use, unspecified, uncomplicated: Secondary | ICD-10-CM | POA: Diagnosis not present

## 2017-06-18 MED ORDER — ARIPIPRAZOLE 10 MG PO TABS: 10.0000 mg | ORAL_TABLET | Freq: Every day | ORAL | 1 refills | Status: DC

## 2017-06-18 MED ORDER — QUETIAPINE FUMARATE 300 MG PO TABS: 300.0000 mg | ORAL_TABLET | Freq: Every day | ORAL | 1 refills | Status: DC

## 2017-06-18 MED ORDER — TRAZODONE HCL 100 MG PO TABS: 100.0000 mg | ORAL_TABLET | Freq: Every day | ORAL | 1 refills | Status: DC

## 2017-06-18 MED ORDER — BUPROPION HCL ER (SR) 150 MG PO TB12: 150.0000 mg | ORAL_TABLET | ORAL | 1 refills | Status: DC
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
## 2017-06-18 NOTE — Telephone Encounter (Signed)
Spoke with pt, advised her that she needed a new sleep study done due to break in therapy. Pt understood and sleep test was ordered. Nothing further is needed.

## 2017-07-05 ENCOUNTER — Other Ambulatory Visit (HOSPITAL_COMMUNITY): Payer: Self-pay | Admitting: Internal Medicine

## 2017-07-05 DIAGNOSIS — I159 Secondary hypertension, unspecified: Secondary | ICD-10-CM

## 2017-07-09 ENCOUNTER — Ambulatory Visit (HOSPITAL_COMMUNITY)
Admission: RE | Admit: 2017-07-09 | Discharge: 2017-07-09 | Disposition: A | Discharge status: Home / Self Care | Payer: Medicaid Other | Source: Ambulatory Visit | Attending: Internal Medicine | Admitting: Internal Medicine

## 2017-07-09 DIAGNOSIS — I159 Secondary hypertension, unspecified: Secondary | ICD-10-CM

## 2017-07-09 DIAGNOSIS — I071 Rheumatic tricuspid insufficiency: Secondary | ICD-10-CM | POA: Diagnosis not present

## 2017-07-09 LAB — ECHOCARDIOGRAM COMPLETE
EERAT: 6.41
EWDT: 187 ms
FS: 36 % (ref 28–44)
IVS/LV PW RATIO, ED: 0.89
LA diam end sys: 33 mm
LA diam index: 1.54 cm/m2
LA vol index: 25 mL/m2
LASIZE: 33 mm
LAVOL: 53.6 mL
LAVOLA4C: 54.1 mL
LV TDI E'LATERAL: 15.5
LV TDI E'MEDIAL: 11.5
LV e' LATERAL: 15.5 cm/s
LVEEAVG: 6.41
LVEEMED: 6.41
LVOT VTI: 18.1 cm
LVOT area: 3.46 cm2
LVOT diameter: 21 mm
LVOT peak vel: 103 cm/s
LVOTSV: 63 mL
Lateral S' vel: 17.7 cm/s
MV Dec: 187
MV Peak grad: 4 mmHg
MVPKAVEL: 63.1 m/s
MVPKEVEL: 99.4 m/s
PW: 9 mm — AB (ref 0.6–1.1)
RV TAPSE: 15.2 mm
RV sys press: 74 mmHg
Reg peak vel: 384 cm/s
TR max vel: 384 cm/s

## 2017-07-09 NOTE — Progress Notes (Signed)
  Echocardiogram 2D Echocardiogram has been performed.  Diane Gilbert 07/09/2017, 1:06 PM

## 2017-07-17 ENCOUNTER — Encounter (HOSPITAL_BASED_OUTPATIENT_CLINIC_OR_DEPARTMENT_OTHER): Payer: Self-pay

## 2017-07-24 ENCOUNTER — Emergency Department (HOSPITAL_COMMUNITY): Payer: Medicaid Other

## 2017-07-24 ENCOUNTER — Encounter (HOSPITAL_COMMUNITY): Payer: Self-pay

## 2017-07-24 ENCOUNTER — Inpatient Hospital Stay (HOSPITAL_COMMUNITY)
Admission: EM | Admit: 2017-07-24 | Discharge: 2017-07-27 | DRG: 291 | Disposition: A | Discharge status: Home / Self Care | Payer: Medicaid Other | Attending: Family Medicine | Admitting: Family Medicine

## 2017-07-24 DIAGNOSIS — F329 Major depressive disorder, single episode, unspecified: Secondary | ICD-10-CM | POA: Diagnosis present

## 2017-07-24 DIAGNOSIS — Z6841 Body Mass Index (BMI) 40.0 and over, adult: Secondary | ICD-10-CM

## 2017-07-24 DIAGNOSIS — R739 Hyperglycemia, unspecified: Secondary | ICD-10-CM | POA: Diagnosis present

## 2017-07-24 DIAGNOSIS — I5043 Acute on chronic combined systolic (congestive) and diastolic (congestive) heart failure: Secondary | ICD-10-CM

## 2017-07-24 DIAGNOSIS — I110 Hypertensive heart disease with heart failure: Secondary | ICD-10-CM | POA: Diagnosis present

## 2017-07-24 DIAGNOSIS — G4733 Obstructive sleep apnea (adult) (pediatric): Secondary | ICD-10-CM | POA: Diagnosis present

## 2017-07-24 DIAGNOSIS — Z902 Acquired absence of lung [part of]: Secondary | ICD-10-CM

## 2017-07-24 DIAGNOSIS — E876 Hypokalemia: Secondary | ICD-10-CM | POA: Diagnosis not present

## 2017-07-24 DIAGNOSIS — F419 Anxiety disorder, unspecified: Secondary | ICD-10-CM | POA: Diagnosis present

## 2017-07-24 DIAGNOSIS — N61 Mastitis without abscess: Secondary | ICD-10-CM | POA: Diagnosis present

## 2017-07-24 DIAGNOSIS — F259 Schizoaffective disorder, unspecified: Secondary | ICD-10-CM | POA: Diagnosis present

## 2017-07-24 DIAGNOSIS — J9621 Acute and chronic respiratory failure with hypoxia: Secondary | ICD-10-CM | POA: Diagnosis present

## 2017-07-24 DIAGNOSIS — J84115 Respiratory bronchiolitis interstitial lung disease: Secondary | ICD-10-CM | POA: Diagnosis present

## 2017-07-24 DIAGNOSIS — I248 Other forms of acute ischemic heart disease: Secondary | ICD-10-CM | POA: Diagnosis present

## 2017-07-24 DIAGNOSIS — G4700 Insomnia, unspecified: Secondary | ICD-10-CM | POA: Diagnosis present

## 2017-07-24 DIAGNOSIS — T501X6A Underdosing of loop [high-ceiling] diuretics, initial encounter: Secondary | ICD-10-CM | POA: Diagnosis present

## 2017-07-24 DIAGNOSIS — I5032 Chronic diastolic (congestive) heart failure: Secondary | ICD-10-CM

## 2017-07-24 DIAGNOSIS — Z23 Encounter for immunization: Secondary | ICD-10-CM

## 2017-07-24 DIAGNOSIS — Z79899 Other long term (current) drug therapy: Secondary | ICD-10-CM

## 2017-07-24 DIAGNOSIS — I5033 Acute on chronic diastolic (congestive) heart failure: Secondary | ICD-10-CM | POA: Diagnosis present

## 2017-07-24 DIAGNOSIS — E871 Hypo-osmolality and hyponatremia: Secondary | ICD-10-CM | POA: Diagnosis present

## 2017-07-24 DIAGNOSIS — F17210 Nicotine dependence, cigarettes, uncomplicated: Secondary | ICD-10-CM | POA: Diagnosis present

## 2017-07-24 DIAGNOSIS — Z91128 Patient's intentional underdosing of medication regimen for other reason: Secondary | ICD-10-CM

## 2017-07-24 DIAGNOSIS — I509 Heart failure, unspecified: Secondary | ICD-10-CM | POA: Diagnosis not present

## 2017-07-24 DIAGNOSIS — Z9981 Dependence on supplemental oxygen: Secondary | ICD-10-CM | POA: Diagnosis not present

## 2017-07-24 DIAGNOSIS — F251 Schizoaffective disorder, depressive type: Secondary | ICD-10-CM

## 2017-07-24 HISTORY — DX: Heart failure, unspecified: I50.9

## 2017-07-24 LAB — CBC
HCT: 51.7 % — ABNORMAL HIGH (ref 36.0–46.0)
Hemoglobin: 14.2 g/dL (ref 12.0–15.0)
MCH: 20 pg — ABNORMAL LOW (ref 26.0–34.0)
MCHC: 27.5 g/dL — ABNORMAL LOW (ref 30.0–36.0)
MCV: 72.8 fL — ABNORMAL LOW (ref 78.0–100.0)
Platelets: 278 10*3/uL (ref 150–400)
RBC: 7.1 MIL/uL — ABNORMAL HIGH (ref 3.87–5.11)
RDW: 22.5 % — ABNORMAL HIGH (ref 11.5–15.5)
WBC: 9.3 10*3/uL (ref 4.0–10.5)

## 2017-07-24 LAB — BASIC METABOLIC PANEL
Anion gap: 11 (ref 5–15)
BUN: 14 mg/dL (ref 6–20)
CO2: 26 mmol/L (ref 22–32)
Calcium: 8.2 mg/dL — ABNORMAL LOW (ref 8.9–10.3)
Chloride: 97 mmol/L — ABNORMAL LOW (ref 101–111)
Creatinine, Ser: 0.98 mg/dL (ref 0.44–1.00)
GFR calc Af Amer: >60 mL/min (ref >60)
GFR calc non Af Amer: >60 mL/min (ref >60)
Glucose, Bld: 116 mg/dL — ABNORMAL HIGH (ref 65–99)
Potassium: 4.1 mmol/L (ref 3.5–5.1)
Sodium: 134 mmol/L — ABNORMAL LOW (ref 135–145)

## 2017-07-24 LAB — BRAIN NATRIURETIC PEPTIDE: B Natriuretic Peptide: 467.5 pg/mL — ABNORMAL HIGH (ref 0.0–100.0)

## 2017-07-24 LAB — I-STAT BETA HCG BLOOD, ED (MC, WL, AP ONLY): I-stat hCG, quantitative: <5 m[IU]/mL (ref <5)

## 2017-07-24 LAB — I-STAT TROPONIN, ED: Troponin i, poc: 0 ng/mL (ref 0.00–0.08)

## 2017-07-24 MED ORDER — NITROGLYCERIN IN D5W 200-5 MCG/ML-% IV SOLN
0.0000 ug/min | INTRAVENOUS | Status: DC
  Administered 2017-07-24: 5 ug/min via INTRAVENOUS
  Filled 2017-07-24: qty 250

## 2017-07-24 MED ORDER — NITROGLYCERIN PEDIATRIC IV INFUSION 100 MCG/ML: 200.0000 ug/min | INTRAVENOUS | Status: DC

## 2017-07-24 MED ORDER — FUROSEMIDE 10 MG/ML IJ SOLN
20.0000 mg | Freq: Two times a day (BID) | INTRAMUSCULAR | Status: DC
  Administered 2017-07-24 – 2017-07-27 (×6): 20 mg via INTRAVENOUS
  Filled 2017-07-24 (×6): qty 2

## 2017-07-24 MED ORDER — NICOTINE 14 MG/24HR TD PT24
14.0000 mg | MEDICATED_PATCH | Freq: Every day | TRANSDERMAL | Status: DC
Start: 2017-07-25 — End: 2017-07-27
  Administered 2017-07-25 – 2017-07-27 (×3): 14 mg via TRANSDERMAL
  Filled 2017-07-24 (×3): qty 1

## 2017-07-24 MED ORDER — FUROSEMIDE 10 MG/ML IJ SOLN
80.0000 mg | Freq: Once | INTRAMUSCULAR | Status: AC
  Administered 2017-07-24: 80 mg via INTRAVENOUS
  Filled 2017-07-24: qty 8

## 2017-07-24 NOTE — ED Notes (Signed)
Left breast is significantly more swollen than right.  Redness to area with pain.

## 2017-07-24 NOTE — ED Notes (Signed)
Pt requesting something to eat and drink, will ask MD

## 2017-07-24 NOTE — ED Triage Notes (Signed)
Pt states that for the past month she has been having swelling all over her body, increased weakness and falling. Pt states that she has been out of her lasix for one month, some SOB and abd pain, hx of CHF

## 2017-07-24 NOTE — H&P (Addendum)
History and Physical    Diane Gilbert TTS:[id number] DOB: [birth year] DOA: 07/24/2017  PCP: Nolene Ebbs, MD  Patient coming from: Home  Chief Complaint: SOB  HPI: Diane Gilbert is a 52 y.o. female with medical history significant of OSA (not on CPAP), ILD with chronic respiratory failure on 2LNC at home, HTN, chronic HFpEF, schizoaffective disorder who presents for SOB X 1 month and swelling X 1 month which has slowly worsened, particularly over the last week.  She notes that she is supposed to be on lasix, but has been out of this medication for the last month.  She has slowly noticed an increase in swelling which started in her legs, but has progressed to her entire body.  She further notes SOB with DOE.  She has had orthopnea with worsening and needing to sleep on 6 pillows. She has also had a 13 pound weight gain and is 250# today by report.  Further symptoms include lightheadedness on standing and falls. She has injured her left arm due to falls (scratches), mild pain in the legs, nausea, cough with phlegm and decreased PO intake.  She is not clear on dietary indiscretions when I asked her, but she does not seem to be fluid restricting.  She further notes for about the last week, her left breast has been swollen and red.  She has not noticed any lumps or changes to the breast and she does not note any nipple discharge.  This is new for her.  Her mother had breast cancer, but she is not sure which kind.  She has erythema of the left breast and abdomen.  She denies chest pain at rest or on exertion.   ED Course: In the ED, she was initially on a NRB due to being 81% on her home O2 of 2L.  She was started on Nitroglycerin IV and lasix IV was given. She has had about 400cc output in her pure wick when I saw her.  She was able to wean down to [facility].  Labs revealed a Na of 134, Cr of 0.98 (at baseline), BNP of 467.5 (up from 140 in November, she is obese).  Initial troponin of 0.00.  Hgb  14.2 and HCT 51.7.  EKG with prolonged QTc, very low voltage, appears similar to previous.  CXR with pulmonary edema, stable cardiomegaly.   Review of Systems: As per HPI otherwise 10 point review of systems negative.   Reports currently that she is not drinking ETOH.  Past Medical History:  Diagnosis Date  . Alcohol abuse   . Anxiety   . Arthritis   . CHF (congestive heart failure) ([place])   . Depression   . Headache(784.0)   . Hypertension   . Insomnia   . Interstitial lung disease ([place])   . Oxygen dependent   . Schizophrenia ([place])   . Shortness of breath dyspnea   . Sleep apnea   . Sleep apnea     Past Surgical History:  Procedure Laterality Date  . BACK SURGERY    . LUNG BIOPSY Left 07/16/2014   Procedure: LUNG BIOPSY;  Surgeon: Grace Isaac, MD;  Location: [facility];  Service: Thoracic;  Laterality: Left;  Marland Kitchen MULTIPLE TOOTH EXTRACTIONS    . VIDEO ASSISTED THORACOSCOPY Left 07/16/2014   Procedure: VIDEO ASSISTED THORACOSCOPY;  Surgeon: Grace Isaac, MD;  Location: [facility];  Service: Thoracic;  Laterality: Left;  Marland Kitchen VIDEO BRONCHOSCOPY Bilateral 04/11/2014   Procedure: VIDEO BRONCHOSCOPY WITH FLUORO;  Surgeon: Lanny Hurst  Cloyde Reams, MD;  Location: [facility];  Service: Cardiopulmonary;  Laterality: Bilateral;  . VIDEO BRONCHOSCOPY N/A 07/16/2014   Procedure: VIDEO BRONCHOSCOPY;  Surgeon: Grace Isaac, MD;  Location: [facility] OR;  Service: Thoracic;  Laterality: N/A;  . WEDGE RESECTION Left 07/16/2014   Procedure: WEDGE RESECTION;  Surgeon: Grace Isaac, MD;  Location: [facility];  Service: Thoracic;  Laterality: Left;  left upper lobe lung resection   She smokes about 6 cigarettes per day, coughs with every cigarette.   reports that she has been smoking cigarettes.  She has a 45.00 pack-year smoking history. she has never used smokeless tobacco. She reports that she does not drink alcohol or use drugs.  No Known Allergies  Reviewed with patient.  Family History  Problem Relation  Age of Onset  . Breast cancer Mother   . Obstructive Sleep Apnea Mother   . COPD Maternal Aunt     Prior to Admission medications   Medication Sig Start Date End Date Taking? Authorizing Provider  OXYGEN Inhale 2 L into the lungs continuous.   Yes [provider]  traZODone (DESYREL) 100 MG tablet Take 1 tablet (100 mg total) by mouth at bedtime. 06/18/17  Yes Arfeen, Arlyce Harman, MD  acetaminophen (TYLENOL) 325 MG tablet Take 2 tablets (650 mg total) by mouth every 6 (six) hours as needed for mild pain (or Fever >/= 101). 03/26/17   Velvet Bathe, MD  albuterol (PROVENTIL HFA;VENTOLIN HFA) 108 (90 BASE) MCG/ACT inhaler Inhale 2 puffs into the lungs every 6 (six) hours as needed for wheezing or shortness of breath. 05/09/15   Juanito Doom, MD  amLODipine (NORVASC) 5 MG tablet Take 5 mg daily by mouth.    [provider]  ARIPiprazole (ABILIFY) 10 MG tablet Take 1 tablet (10 mg total) by mouth daily. 06/18/17   Arfeen, Arlyce Harman, MD  budesonide-formoterol (SYMBICORT) 160-4.5 MCG/ACT inhaler Inhale 2 puffs into the lungs 2 (two) times daily. 03/23/17   Parrett, Fonnie Mu, NP  buPROPion (WELLBUTRIN SR) 150 MG 12 hr tablet Take 1 tablet (150 mg total) by mouth See admin instructions. Take 1 tablet (150 mg) by mouth every morning and at noon 06/18/17   Arfeen, Arlyce Harman, MD  furosemide (LASIX) 20 MG tablet Take 1 tablet (20 mg total) by mouth daily. 06/11/17   Parrett, Fonnie Mu, NP  gabapentin (NEURONTIN) 300 MG capsule Take 300-600 mg See admin instructions by mouth. 377m by mouth three times daily, 6064mat bedtime    [provider]  guaiFENesin (MUCINEX) 600 MG 12 hr tablet Take 1 tablet (600 mg total) 2 (two) times daily by mouth. 05/11/17   Vann, JeTomi BambergerDO  nicotine (NICODERM CQ - DOSED IN MG/24 HOURS) 14 mg/24hr patch Place 1 patch (14 mg total) daily onto the skin. Patient not taking: Reported on 06/11/2017 05/12/17   VaGeradine GirtDO  oxyCODONE-acetaminophen  (PERCOCET/ROXICET) 5-325 MG tablet Take 1 tablet every 6 (six) hours as needed by mouth for pain. 04/30/17   [provider]  QUEtiapine (SEROQUEL) 300 MG tablet Take 1 tablet (300 mg total) by mouth at bedtime. 06/18/17   Arfeen, SyArlyce HarmanMD  Reports that she has been recently taken off seroquel, gabapentin, percocet, abilify and wellbutrin.  She is only taking trazodone and amlodipine and her inhalers per her.    Physical Exam: Vitals:   07/24/17 1926 07/24/17 1928 07/24/17 2004 07/24/17 2022  BP:  (!) 145/81 124/75 140/79  Pulse:  93  89 93  Resp:   (!) 27 (!) 24  Temp:      SpO2: 100% 100% 100% 100%  Weight:        Constitutional: Obese woman, in no acute distress when lying still, alert Vitals:   07/24/17 1926 07/24/17 1928 07/24/17 2004 07/24/17 2022  BP:  (!) 145/81 124/75 140/79  Pulse:  93 89 93  Resp:   (!) 27 (!) 24  Temp:      SpO2: 100% 100% 100% 100%  Weight:       Eyes: PERRL, lids and conjunctivae normal ENMT: Mucous membranes are moist. Posterior pharynx clear of any exudate or lesions.  She has erythema on cheeks and face, appears to be rosacea pattern Neck: normal, supple Respiratory: Crackles to mid lung fields, some course rales, no wheezing.  Leith in place Cardiovascular: Mildly tachycardic, regular.  No murmur that I could appreciate.   Abdomen: + distention, tender to palpation, No HSM  Musculoskeletal: + swelling of hands.  + LE edema, 3+ to abdomen.  Skin: + erythema and swelling of left breast with some induration at breast, no palpable masses noted on exam.  + pitting edema to sacrum.  + erythema (homogenous) to abdomen Neurologic: Movements grossly intact, she has some disconjugation of gaze Psychiatric: Normal judgment and insight. Alert and oriented x 3. Normal mood.   Labs on Admission: I have personally reviewed following labs and imaging studies  CBC: Recent Labs  Lab 07/24/17 1911  WBC 9.3  HGB 14.2  HCT 51.7*  MCV 72.8*  PLT 878    Basic Metabolic Panel: Recent Labs  Lab 07/24/17 1911  NA 134*  K 4.1  CL 97*  CO2 26  GLUCOSE 116*  BUN 14  CREATININE 0.98  CALCIUM 8.2*   GFR: Estimated Creatinine Clearance: 85.7 mL/min (by C-G formula based on SCr of 0.98 mg/dL). Liver Function Tests: No results for input(s): AST, ALT, ALKPHOS, BILITOT, PROT, ALBUMIN in the last 168 hours. No results for input(s): LIPASE, AMYLASE in the last 168 hours. No results for input(s): AMMONIA in the last 168 hours. Coagulation Profile: No results for input(s): INR, PROTIME in the last 168 hours. Cardiac Enzymes: No results for input(s): CKTOTAL, CKMB, CKMBINDEX, TROPONINI in the last 168 hours. BNP (last 3 results) No results for input(s): PROBNP in the last 8760 hours. HbA1C: No results for input(s): HGBA1C in the last 72 hours. CBG: No results for input(s): GLUCAP in the last 168 hours. Lipid Profile: No results for input(s): CHOL, HDL, LDLCALC, TRIG, CHOLHDL, LDLDIRECT in the last 72 hours. Thyroid Function Tests: No results for input(s): TSH, T4TOTAL, FREET4, T3FREE, THYROIDAB in the last 72 hours. Anemia Panel: No results for input(s): VITAMINB12, FOLATE, FERRITIN, TIBC, IRON, RETICCTPCT in the last 72 hours. Urine analysis:    Component Value Date/Time   COLORURINE AMBER (A) 03/24/2016 1530   APPEARANCEUR CLOUDY (A) 03/24/2016 1530   LABSPEC 1.024 03/24/2016 1530   PHURINE 6.0 03/24/2016 1530   GLUCOSEU NEGATIVE 03/24/2016 1530   HGBUR NEGATIVE 03/24/2016 1530   BILIRUBINUR NEGATIVE 03/24/2016 1530   KETONESUR NEGATIVE 03/24/2016 1530   PROTEINUR 30 (A) 03/24/2016 1530   UROBILINOGEN 0.2 07/13/2014 1122   NITRITE NEGATIVE 03/24/2016 1530   LEUKOCYTESUR NEGATIVE 03/24/2016 1530    Radiological Exams on Admission: Dg Chest Portable 1 View  Result Date: 07/24/2017 CLINICAL DATA:  Dyspnea EXAM: PORTABLE CHEST 1 VIEW COMPARISON:  05/09/2017 chest radiograph. FINDINGS: Stable cardiomediastinal silhouette with  mild cardiomegaly. No pneumothorax. No  pleural effusion. Diffuse hazy and linear parahilar opacities in both lungs. IMPRESSION: Stable cardiomegaly with hazy and linear parahilar opacities throughout both lungs, most compatible with moderate congestive heart failure. Electronically Signed   By: Ilona Sorrel M.D.   On: 07/24/2017 20:05    EKG: Independently reviewed. Low voltage, right atrial enlargement, prolonged QTc  Assessment/Plan Acute on chronic diastolic CHF (congestive heart failure); Acute on chronic respiratory failure with hypoxia  - Improving since being admitted to ED, she is now on [place] oxygen - Continue Nitro drip for acute decompensated failure overnight, wean to off tomorrow - IV lasix 20m BID (she is on 210mPO at home), increase as needed - Telemetry - Strict I/O - Daily weight - Oxygen to keep saturation > 92% - Trend troponin - AM EKG - Check LFTs - Tobacco cessation counseling - Repeat TTE ordered.   Breast erythema, swelling, unilateral - could be related to volume overload, but given presentation could also be mastitis (no fever, discharge) or inflammatory breast Ca - Last mammogram about 6 months ago was negative - If does not improve as expected with diuresis and treatment of volume overload, she will need a diagnostic mammogram, preferably soon after discharge.   - Given erythema on breast and abdomen, will start doxycycline for possible non suppurative cellulitis/mastitis.  If redness improves with diuresis, this can be stopped.  WBC is normal.      Respiratory bronchiolitis interstitial lung disease - Reviewed pulmonary notes, their feeling is that her continued disease is due to ongoing tobacco use - Nicotine patch - Tobacco cessation counseling    OSA (obstructive sleep apnea) - She is not able to tolerate sleep mask, currently not using - Oxygen to keep O2 saturation > 92% - Continuous pulse ox  HTN - Held amlodipine while on nitro drip  - Restart  once patient no longer on drip   Schizoaffective disorder (also listed as schizophrenia in her med list) - She reports only being on trazodone which I started, consider confirming with pharmacy in the AM    Hyperglycemia - Daily CBG monitoring  Mild hyponatremia - Trend with daily BMET   DVT prophylaxis: Lovenox Code Status: Full Family Communication: Significant other in room Disposition Plan: Admit for diuresis Consults called: None, consider HF team if not improving Admission status: SDU   EmGilles ChiquitoD [facility] Pager [phone] 7PM-7AM, please contact night-coverage [email or web address] Password [email or web address]1/26/2019, 9:09 PM

## 2017-07-25 ENCOUNTER — Other Ambulatory Visit: Payer: Self-pay

## 2017-07-25 ENCOUNTER — Inpatient Hospital Stay (HOSPITAL_COMMUNITY): Payer: Medicaid Other

## 2017-07-25 DIAGNOSIS — G4733 Obstructive sleep apnea (adult) (pediatric): Secondary | ICD-10-CM

## 2017-07-25 DIAGNOSIS — J9621 Acute and chronic respiratory failure with hypoxia: Secondary | ICD-10-CM

## 2017-07-25 DIAGNOSIS — J84115 Respiratory bronchiolitis interstitial lung disease: Secondary | ICD-10-CM

## 2017-07-25 DIAGNOSIS — I5033 Acute on chronic diastolic (congestive) heart failure: Secondary | ICD-10-CM

## 2017-07-25 DIAGNOSIS — I509 Heart failure, unspecified: Secondary | ICD-10-CM

## 2017-07-25 LAB — ECHOCARDIOGRAM COMPLETE
HEIGHTINCHES: 64 in
Weight: 4204.61 oz

## 2017-07-25 LAB — CBC
HEMATOCRIT: 50.5 % — AB (ref 36.0–46.0)
Hemoglobin: 14.6 g/dL (ref 12.0–15.0)
MCH: 21 pg — ABNORMAL LOW (ref 26.0–34.0)
MCHC: 28.9 g/dL — AB (ref 30.0–36.0)
MCV: 72.6 fL — ABNORMAL LOW (ref 78.0–100.0)
PLATELETS: 257 10*3/uL (ref 150–400)
RBC: 6.96 MIL/uL — ABNORMAL HIGH (ref 3.87–5.11)
RDW: 22.5 % — AB (ref 11.5–15.5)
WBC: 8.4 10*3/uL (ref 4.0–10.5)

## 2017-07-25 LAB — TROPONIN I
TROPONIN I: 0.04 ng/mL — AB (ref <0.03)
Troponin I: 0.04 ng/mL (ref <0.03)

## 2017-07-25 LAB — BASIC METABOLIC PANEL
Anion gap: 11 (ref 5–15)
BUN: 16 mg/dL (ref 6–20)
CALCIUM: 8.2 mg/dL — AB (ref 8.9–10.3)
CO2: 31 mmol/L (ref 22–32)
Chloride: 97 mmol/L — ABNORMAL LOW (ref 101–111)
Creatinine, Ser: 1.23 mg/dL — ABNORMAL HIGH (ref 0.44–1.00)
GFR calc Af Amer: 59 mL/min — ABNORMAL LOW (ref >60)
GFR, EST NON AFRICAN AMERICAN: 51 mL/min — AB (ref >60)
GLUCOSE: 106 mg/dL — AB (ref 65–99)
Potassium: 4 mmol/L (ref 3.5–5.1)
Sodium: 139 mmol/L (ref 135–145)

## 2017-07-25 LAB — HEPATIC FUNCTION PANEL
ALK PHOS: 76 U/L (ref 38–126)
ALT: 21 U/L (ref 14–54)
AST: 32 U/L (ref 15–41)
Albumin: 2.3 g/dL — ABNORMAL LOW (ref 3.5–5.0)
BILIRUBIN INDIRECT: 0.8 mg/dL (ref 0.3–0.9)
Bilirubin, Direct: 0.4 mg/dL (ref 0.1–0.5)
TOTAL PROTEIN: 6.1 g/dL — AB (ref 6.5–8.1)
Total Bilirubin: 1.2 mg/dL (ref 0.3–1.2)

## 2017-07-25 LAB — RAPID URINE DRUG SCREEN, HOSP PERFORMED
Amphetamines: NOT DETECTED
BARBITURATES: NOT DETECTED
Benzodiazepines: NOT DETECTED
COCAINE: POSITIVE — AB
Opiates: NOT DETECTED
Tetrahydrocannabinol: NOT DETECTED

## 2017-07-25 LAB — MAGNESIUM: MAGNESIUM: 1.4 mg/dL — AB (ref 1.7–2.4)

## 2017-07-25 LAB — MRSA PCR SCREENING: MRSA by PCR: NEGATIVE

## 2017-07-25 MED ORDER — MOMETASONE FURO-FORMOTEROL FUM 200-5 MCG/ACT IN AERO
2.0000 | INHALATION_SPRAY | Freq: Two times a day (BID) | RESPIRATORY_TRACT | Status: DC
  Administered 2017-07-25 – 2017-07-27 (×5): 2 via RESPIRATORY_TRACT
  Filled 2017-07-25: qty 8.8

## 2017-07-25 MED ORDER — SODIUM CHLORIDE 0.9% FLUSH: 3.0000 mL | INTRAVENOUS | Status: DC | PRN

## 2017-07-25 MED ORDER — ACETAMINOPHEN 325 MG PO TABS
650.0000 mg | ORAL_TABLET | Freq: Four times a day (QID) | ORAL | Status: DC | PRN
  Administered 2017-07-25: 650 mg via ORAL
  Filled 2017-07-25: qty 2

## 2017-07-25 MED ORDER — INFLUENZA VAC SPLIT QUAD 0.5 ML IM SUSY
0.5000 mL | PREFILLED_SYRINGE | INTRAMUSCULAR | Status: AC
  Administered 2017-07-26: 0.5 mL via INTRAMUSCULAR
  Filled 2017-07-25: qty 0.5

## 2017-07-25 MED ORDER — TRAZODONE HCL 100 MG PO TABS
100.0000 mg | ORAL_TABLET | Freq: Every day | ORAL | Status: DC
  Administered 2017-07-25 – 2017-07-26 (×3): 100 mg via ORAL
  Filled 2017-07-25 (×2): qty 1
  Filled 2017-07-25: qty 2

## 2017-07-25 MED ORDER — ENOXAPARIN SODIUM 40 MG/0.4ML ~~LOC~~ SOLN
40.0000 mg | SUBCUTANEOUS | Status: DC
  Administered 2017-07-25 – 2017-07-27 (×3): 40 mg via SUBCUTANEOUS
  Filled 2017-07-25 (×3): qty 0.4

## 2017-07-25 MED ORDER — BUPROPION HCL ER (SR) 150 MG PO TB12
150.0000 mg | ORAL_TABLET | Freq: Two times a day (BID) | ORAL | Status: DC
  Administered 2017-07-25 – 2017-07-27 (×4): 150 mg via ORAL
  Filled 2017-07-25 (×4): qty 1

## 2017-07-25 MED ORDER — LISINOPRIL 2.5 MG PO TABS
2.5000 mg | ORAL_TABLET | Freq: Every day | ORAL | Status: DC
  Administered 2017-07-25 – 2017-07-27 (×3): 2.5 mg via ORAL
  Filled 2017-07-25 (×3): qty 1

## 2017-07-25 MED ORDER — ASPIRIN EC 81 MG PO TBEC
81.0000 mg | DELAYED_RELEASE_TABLET | Freq: Every day | ORAL | Status: DC
  Administered 2017-07-25 – 2017-07-27 (×3): 81 mg via ORAL
  Filled 2017-07-25 (×3): qty 1

## 2017-07-25 MED ORDER — SODIUM CHLORIDE 0.9 % IV SOLN: 250.0000 mL | INTRAVENOUS | Status: DC | PRN

## 2017-07-25 MED ORDER — ARIPIPRAZOLE 5 MG PO TABS
10.0000 mg | ORAL_TABLET | Freq: Every day | ORAL | Status: DC
  Administered 2017-07-25 – 2017-07-27 (×3): 10 mg via ORAL
  Filled 2017-07-25 (×3): qty 2

## 2017-07-25 MED ORDER — ALBUTEROL SULFATE (2.5 MG/3ML) 0.083% IN NEBU: 3.0000 mL | INHALATION_SOLUTION | Freq: Four times a day (QID) | RESPIRATORY_TRACT | Status: DC | PRN

## 2017-07-25 MED ORDER — DOXYCYCLINE HYCLATE 100 MG PO TABS
100.0000 mg | ORAL_TABLET | Freq: Two times a day (BID) | ORAL | Status: DC
  Administered 2017-07-25 – 2017-07-27 (×5): 100 mg via ORAL
  Filled 2017-07-25 (×5): qty 1

## 2017-07-25 MED ORDER — SODIUM CHLORIDE 0.9% FLUSH
3.0000 mL | Freq: Two times a day (BID) | INTRAVENOUS | Status: DC
  Administered 2017-07-25 – 2017-07-27 (×4): 3 mL via INTRAVENOUS

## 2017-07-25 NOTE — Progress Notes (Signed)
CRITICAL VALUE ALERT  Critical Value:  Troponin 0.04  Date & Time Notied:  06/24/18 @ 2.27 AM  Provider Notified: X. Blount.  Orders Received/Actions taken: No new orders received.

## 2017-07-25 NOTE — Progress Notes (Signed)
PROGRESS NOTE  BRIDIE COLQUHOUN  ZOX:[id number] DOB: 12-27-1965 DOA: 07/24/2017 PCP: Nolene Ebbs, MD  Outpatient Specialists: Pulmonology, [place] Brief Narrative: Diane Gilbert is a 52 y.o. female with a history of chronic respiratory failure due to ILD, OSA not tolerant of CPAP, chronic HFpEF, HTN, and schizoaffective disorder who presented to the ED with 1 month of slowly progressive dyspnea and edema in the setting of not taking lasix. She reported new 6 pillow orthopnea and weight gain. Also incidentally noted nontender left breast swelling thought to be related to progressive edema extending from legs, now involving abdomen and arms. On initial evaluation she was hypoxic to 81% on home 2L O2, placed on NRB. BNP elevated to 467, ECG unchanged, troponin negative, no chest pain, and CXR demonstrated pulmonary edema, stable cardiomegaly, so IV lasix and NTG gtt started. Oxygen has been tapered to 4LPM with >4L UOP since admission.   Assessment & Plan: Active Problems:   Respiratory bronchiolitis interstitial lung disease (HCC)   Acute on chronic respiratory failure with hypoxia (HCC)   OSA (obstructive sleep apnea)   Acute on chronic diastolic CHF (congestive heart failure) ([facility])  Acute on chronic hypoxic respiratory failure: Due to pulmonary edema in setting of medication nonadherence on chronic ILD, untreated OSA, and ongoing tobacco use.  - Diuresis as below, if not improving, may need pulmonology assistance, though no indication for steroids at this time.  - Wean to home 2L as tolerated.  - Nicotine patch, cessation counseling provided - Unable to tolerate CPAP, so this is not ordered.  Acute on chronic HFpEF:  - Continue diuresis with lasix 75m IV BID. Monitor UOP closely as she had brisk diuresis with bump in creatinine, though she did receive 1068mIV lasix total in ED.  - Strict I/O, daily weights - Ok to DC NTG gtt.  - Repeat echo  Left breast swelling, erythema:  Erythema is mild, not tender or warm. No red flags, no palpable masses and screening mammogram negative 6 months ago.  - Suspect this is related to volume overload. - Possibly could have been cellulitis that has responded quickly to treatment, so will complete 5 days doxycycline.   Troponin elevation: Very mild, plateau at 0.04 with ECG on admission personally reviewed, showed NSR w/low voltage, RAD with RAE and no overt ischemia.  - Monitor, suspect demand ischemia from CHF.   Prolonged QT interval (QTc 55880m) - Monitor on telemetry, minimize provocative medications  HTN: Chronic, stable - Restart norvasc if no LV dysfxn on echo.   Schizoaffective disorder and history of EtOH, cocaine use: No evidence of psychosis. May be impacting adherence. Followed by Dr. ArfAdele Schilderhose last note states "I will continue Seroquel 300 mg at bedtime, trazodone 100 mg at bedtime, Wellbutrin SR 100 mg twice a day and reduce Abilify to 10 mg." - Continue abilify, wellbutrin, trazodone. Hesitant to restart gabapentin and seroqeul with risk for sedation - Check UDS  DVT prophylaxis: Lovenox Code Status: Full Family Communication: Son in room Disposition Plan: Home once improved.   Consultants:   None  Procedures:   Echo  Antimicrobials:  Doxycycline.   Subjective: Breathing better than at admission, slow to speak, and not very helpful historian. No chest pain or fever.   Objective: Vitals:   07/25/17 0808 07/25/17 0903 07/25/17 1037 07/25/17 1154  BP:  130/71  114/79  Pulse:    95  Resp:      Temp:      TempSrc:    Oral  SpO2: (!) 88%  94% 96%  Weight:      Height:        Intake/Output Summary (Last 24 hours) at 07/25/2017 1207 Last data filed at 07/25/2017 1153 Gross per 24 hour  Intake 610.33 ml  Output 4850 ml  Net -4239.67 ml   Filed Weights   07/24/17 1908 07/25/17 0152  Weight: 113.4 kg (250 lb) 119.2 kg (262 lb 12.6 oz)    Gen: Obese 52 y.o. female in no distress    Pulm: Non-labored with diffuse crackles.  CV: Regular rate and rhythm. No murmur, rub, or gallop. Unable to appreciate JVD. Has diffuse pitting edema. GI: Abdomen soft, non-tender, non-distended, with normoactive bowel sounds. No organomegaly or masses felt. Ext: Warm, no deformities Skin: Left breast with 1+ pitting edema, no nipple discharge/bleeding, very faint erythema that is nontender. Neuro: Alert and oriented. No focal neurological deficits. Psych: Judgement and insight appear fair. Neurovegetative. Mood withdrawn & affect appropriate.   Data Reviewed: I have personally reviewed following labs and imaging studies  CBC: Recent Labs  Lab 07/24/17 1911 07/25/17 0206  WBC 9.3 8.4  HGB 14.2 14.6  HCT 51.7* 50.5*  MCV 72.8* 72.6*  PLT 278 827   Basic Metabolic Panel: Recent Labs  Lab 07/24/17 1911 07/25/17 0206  NA 134* 139  K 4.1 4.0  CL 97* 97*  CO2 26 31  GLUCOSE 116* 106*  BUN 14 16  CREATININE 0.98 1.23*  CALCIUM 8.2* 8.2*  MG  --  1.4*   GFR: Estimated Creatinine Clearance: 70.3 mL/min (A) (by C-G formula based on SCr of 1.23 mg/dL (H)). Liver Function Tests: Recent Labs  Lab 07/25/17 0206  AST 32  ALT 21  ALKPHOS 76  BILITOT 1.2  PROT 6.1*  ALBUMIN 2.3*   No results for input(s): LIPASE, AMYLASE in the last 168 hours. No results for input(s): AMMONIA in the last 168 hours. Coagulation Profile: No results for input(s): INR, PROTIME in the last 168 hours. Cardiac Enzymes: Recent Labs  Lab 07/25/17 0206 07/25/17 0752  TROPONINI 0.04* 0.04*   BNP (last 3 results) No results for input(s): PROBNP in the last 8760 hours. HbA1C: No results for input(s): HGBA1C in the last 72 hours. CBG: No results for input(s): GLUCAP in the last 168 hours. Lipid Profile: No results for input(s): CHOL, HDL, LDLCALC, TRIG, CHOLHDL, LDLDIRECT in the last 72 hours. Thyroid Function Tests: No results for input(s): TSH, T4TOTAL, FREET4, T3FREE, THYROIDAB in the last  72 hours. Anemia Panel: No results for input(s): VITAMINB12, FOLATE, FERRITIN, TIBC, IRON, RETICCTPCT in the last 72 hours. Urine analysis:    Component Value Date/Time   COLORURINE AMBER (A) 03/24/2016 1530   APPEARANCEUR CLOUDY (A) 03/24/2016 1530   LABSPEC 1.024 03/24/2016 1530   PHURINE 6.0 03/24/2016 1530   GLUCOSEU NEGATIVE 03/24/2016 1530   HGBUR NEGATIVE 03/24/2016 1530   BILIRUBINUR NEGATIVE 03/24/2016 1530   KETONESUR NEGATIVE 03/24/2016 1530   PROTEINUR 30 (A) 03/24/2016 1530   UROBILINOGEN 0.2 07/13/2014 1122   NITRITE NEGATIVE 03/24/2016 1530   LEUKOCYTESUR NEGATIVE 03/24/2016 1530   Recent Results (from the past 240 hour(s))  MRSA PCR Screening     Status: None   Collection Time: 07/25/17  1:53 AM  Result Value Ref Range Status   MRSA by PCR NEGATIVE NEGATIVE Final    Comment:        The GeneXpert MRSA Assay (FDA approved for NASAL specimens only), is one component of a comprehensive MRSA colonization surveillance program.  It is not intended to diagnose MRSA infection nor to guide or monitor treatment for MRSA infections.       Radiology Studies: Dg Chest Portable 1 View  Result Date: 07/24/2017 CLINICAL DATA:  Dyspnea EXAM: PORTABLE CHEST 1 VIEW COMPARISON:  05/09/2017 chest radiograph. FINDINGS: Stable cardiomediastinal silhouette with mild cardiomegaly. No pneumothorax. No pleural effusion. Diffuse hazy and linear parahilar opacities in both lungs. IMPRESSION: Stable cardiomegaly with hazy and linear parahilar opacities throughout both lungs, most compatible with moderate congestive heart failure. Electronically Signed   By: Ilona Sorrel M.D.   On: 07/24/2017 20:05    Scheduled Meds: . aspirin EC  81 mg Oral Daily  . doxycycline  100 mg Oral Q12H  . enoxaparin (LOVENOX) injection  40 mg Subcutaneous Q24H  . furosemide  20 mg Intravenous Q12H  . [START ON 07/26/2017] Influenza vac split quadrivalent PF  0.5 mL Intramuscular Tomorrow-1000  . lisinopril   2.5 mg Oral Daily  . mometasone-formoterol  2 puff Inhalation BID  . nicotine  14 mg Transdermal Daily  . sodium chloride flush  3 mL Intravenous Q12H  . traZODone  100 mg Oral QHS   Continuous Infusions: . sodium chloride    . nitroGLYCERIN 5 mcg/min (07/24/17 2007)     LOS: 1 day   Time spent: 25 minutes.  Vance Gather, MD [facility] Pager [phone]  If 7PM-7AM, please contact night-coverage [email or web address] Password [facility] 07/25/2017, 12:07 PM

## 2017-07-25 NOTE — Progress Notes (Signed)
  Echocardiogram 2D Echocardiogram has been performed.  Bobbye Charleston 07/25/2017, 3:44 PM

## 2017-07-26 ENCOUNTER — Ambulatory Visit: Payer: Medicaid Other | Admitting: Pulmonary Disease

## 2017-07-26 LAB — BASIC METABOLIC PANEL
Anion gap: 14 (ref 5–15)
BUN: 6 mg/dL (ref 6–20)
CHLORIDE: 95 mmol/L — AB (ref 101–111)
CO2: 31 mmol/L (ref 22–32)
CREATININE: 0.74 mg/dL (ref 0.44–1.00)
Calcium: 7.9 mg/dL — ABNORMAL LOW (ref 8.9–10.3)
GFR calc Af Amer: >60 mL/min (ref >60)
GFR calc non Af Amer: >60 mL/min (ref >60)
Glucose, Bld: 90 mg/dL (ref 65–99)
Potassium: 3.1 mmol/L — ABNORMAL LOW (ref 3.5–5.1)
Sodium: 140 mmol/L (ref 135–145)

## 2017-07-26 MED ORDER — PNEUMOCOCCAL VAC POLYVALENT 25 MCG/0.5ML IJ INJ
0.5000 mL | INJECTION | INTRAMUSCULAR | Status: AC
  Administered 2017-07-27: 0.5 mL via INTRAMUSCULAR
  Filled 2017-07-26: qty 0.5

## 2017-07-26 MED ORDER — POTASSIUM CHLORIDE CRYS ER 20 MEQ PO TBCR
40.0000 meq | EXTENDED_RELEASE_TABLET | Freq: Two times a day (BID) | ORAL | Status: AC
  Administered 2017-07-26 (×2): 40 meq via ORAL
  Filled 2017-07-26 (×2): qty 2

## 2017-07-26 NOTE — Progress Notes (Deleted)
Subjective:    Patient ID: Diane Gilbert, female    DOB: 11-15-1965, 52 y.o.   MRN: [id number]  Former patient of Dr. Gwenette Greet with respiratory bronchiolitis interstitial lung disease Dr. Gwenette Greet summarized her clinical situation as follows: HRCT 12/2013:  Mild GG, centrilobular nodularity PFTs 2015:  FEV1 1.48 (56%), but normal ratio.  No BD response.  TLC 60%, mild airtrapping.  DLCO 45%, but corrects to normal with Av Bronch 03/2014:  BAL with 85 WBC's, 21% eos.  TBBX with normal lung parenchyma, but not enough for possible specific diagnosis HP panel negative 03/2014 Pred trial 04/16/2014 >> unclear if any better.  VATS BX 06/2014:  RB-ILD (severe), ??? Component NSIP included in the differential [facility] labs normal 07/2014 March 2017 6 minute walk distance 144 m, O2 saturation nadir 81% on room air March 2017 pulmonary function testing ratio 85%, FVC 1.61 L (50% predicted) sees, FEV1 1.37 L (53% predicted), total lung capacity 3.14 L (58% predicted), DLCO 13.3 (49% predicted).  HPI No chief complaint on file.  ***  Past Medical History:  Diagnosis Date  . Alcohol abuse   . Anxiety   . Arthritis   . CHF (congestive heart failure) ([place])   . Depression   . Headache(784.0)   . Hypertension   . Insomnia   . Interstitial lung disease ([place])   . Oxygen dependent   . Schizophrenia ([place])   . Shortness of breath dyspnea   . Sleep apnea   . Sleep apnea       Review of Systems  Constitutional: Negative for chills, fatigue and fever.  HENT: Negative for hearing loss, postnasal drip and rhinorrhea.   Respiratory: Positive for cough and shortness of breath. Negative for wheezing.   Cardiovascular: Negative for chest pain, palpitations and leg swelling.       Objective:   Physical Exam There were no vitals filed for this visit. 2 L [facility] records reviewed from January 2018 where she was hospitalized for a COPD exacerbation and CHF exacerbation, diuresed,  and treated with home oxygen therapy  Last PFTs reviewed  CBC    Component Value Date/Time   WBC 8.4 07/25/2017 0206   RBC 6.96 (H) 07/25/2017 0206   HGB 14.6 07/25/2017 0206   HCT 50.5 (H) 07/25/2017 0206   PLT 257 07/25/2017 0206   MCV 72.6 (L) 07/25/2017 0206   MCH 21.0 (L) 07/25/2017 0206   MCHC 28.9 (L) 07/25/2017 0206   RDW 22.5 (H) 07/25/2017 0206   LYMPHSABS 6.2 (H) 03/23/2017 1842   MONOABS 1.1 (H) 03/23/2017 1842   EOSABS 0.3 03/23/2017 1842   BASOSABS 0.0 03/23/2017 1842          Assessment & Plan:   No diagnosis found.  Discussion: ***   No current facility-administered medications for this visit.  No current outpatient medications on file.  Facility-Administered Medications Ordered in Other Visits:  .  0.9 %  sodium chloride infusion, 250 mL, Intravenous, PRN, Gilles Chiquito B, MD .  acetaminophen (TYLENOL) tablet 650 mg, 650 mg, Oral, Q6H PRN, Sid Falcon, MD, 650 mg at 07/25/17 0014 .  albuterol (PROVENTIL) (2.5 MG/3ML) 0.083% nebulizer solution 3 mL, 3 mL, Inhalation, Q6H PRN, Gilles Chiquito B, MD .  ARIPiprazole (ABILIFY) tablet 10 mg, 10 mg, Oral, Daily, Patrecia Pour, MD, 10 mg at 07/26/17 0820 .  aspirin EC tablet 81 mg, 81 mg, Oral, Daily, Gilles Chiquito B, MD, 81 mg at 07/26/17 0820 .  buPROPion [facility] SR) 12 hr tablet 150 mg, 150 mg, Oral, BID, Patrecia Pour, MD, 150 mg at 07/26/17 0820 .  doxycycline (VIBRA-TABS) tablet 100 mg, 100 mg, Oral, Q12H, Gilles Chiquito B, MD, 100 mg at 07/26/17 0203 .  enoxaparin (LOVENOX) injection 40 mg, 40 mg, Subcutaneous, Q24H, Gilles Chiquito B, MD, 40 mg at 07/26/17 0820 .  furosemide (LASIX) injection 20 mg, 20 mg, Intravenous, Q12H, Gilles Chiquito B, MD, 20 mg at 07/26/17 0826 .  Influenza vac split quadrivalent PF (FLUARIX) injection 0.5 mL, 0.5 mL, Intramuscular, Tomorrow-1000, Gilles Chiquito B, MD .  lisinopril (PRINIVIL,ZESTRIL) tablet 2.5 mg, 2.5 mg, Oral, Daily, Gilles Chiquito B, MD, 2.5 mg at 07/26/17  0300 .  mometasone-formoterol (DULERA) 200-5 MCG/ACT inhaler 2 puff, 2 puff, Inhalation, BID, Sid Falcon, MD, 2 puff at 07/26/17 0755 .  nicotine (NICODERM CQ - dosed in mg/24 hours) patch 14 mg, 14 mg, Transdermal, Daily, Gilles Chiquito B, MD, 14 mg at 07/26/17 9233 .  nitroGLYCERIN 50 mg in dextrose 5 % 250 mL (0.2 mg/mL) infusion, 0-200 mcg/min, Intravenous, Titrated, Virgel Manifold, MD, Last Rate: 1.5 mL/hr at 07/24/17 2007, 5 mcg/min at 07/24/17 2007 .  sodium chloride flush (NS) 0.9 % injection 3 mL, 3 mL, Intravenous, Q12H, Gilles Chiquito B, MD, 3 mL at 07/25/17 2129 .  sodium chloride flush (NS) 0.9 % injection 3 mL, 3 mL, Intravenous, PRN, Gilles Chiquito B, MD .  traZODone (DESYREL) tablet 100 mg, 100 mg, Oral, QHS, Gilles Chiquito B, MD, 100 mg at 07/25/17 2128

## 2017-07-26 NOTE — Progress Notes (Addendum)
PROGRESS NOTE  Diane Gilbert  PVG:[id number] DOB: [birth year] DOA: 07/24/2017 PCP: Nolene Ebbs, MD  Outpatient Specialists: Pulmonology, [place] Brief Narrative: Diane Gilbert is a 52 y.o. female with a history of chronic respiratory failure due to ILD, OSA not tolerant of CPAP, chronic HFpEF, HTN, and schizoaffective disorder who presented to the ED with 1 month of slowly progressive dyspnea and edema in the setting of not taking lasix. She reported new 6 pillow orthopnea and weight gain. Also incidentally noted nontender left breast swelling thought to be related to progressive edema extending from legs, now involving abdomen and arms. On initial evaluation she was hypoxic to 81% on home 2L O2, placed on NRB. BNP elevated to 467, ECG unchanged, troponin negative, no chest pain, and CXR demonstrated pulmonary edema, stable cardiomegaly, so IV lasix and NTG gtt started. Oxygen has been tapered to 4LPM with >4L UOP since admission.   Assessment & Plan: Active Problems:   Respiratory bronchiolitis interstitial lung disease (HCC)   Acute on chronic respiratory failure with hypoxia (HCC)   OSA (obstructive sleep apnea)   Acute on chronic diastolic CHF (congestive heart failure) (HCC)  Acute on chronic hypoxic respiratory failure: Due to pulmonary edema in setting of medication nonadherence on chronic ILD, untreated OSA, and ongoing tobacco use.  - Continue diuresis. Do not see indication of ILD flare at this time.  - Wean to home 2L as tolerated.  - Nicotine patch, cessation counseling provided - Unable to tolerate CPAP, so this is not ordered.  Acute on chronic HFpEF with right heart failure:  - Continue diuresis with lasix 48m IV BID.  - Strict I/O, daily weights   Hypokalemia:  - Replace and recheck.   Left breast swelling, erythema: Erythema is mild, not tender or warm. No red flags, no palpable masses and screening mammogram negative 6 months ago.  - Suspect this is  related to volume overload, improving. - Possibly could have been cellulitis that has responded quickly to treatment, so will complete 5 days doxycycline.   Troponin elevation: Very mild, plateau at 0.04 with ECG on admission personally reviewed, showed NSR w/low voltage, RAD with RAE and no overt ischemia.  - Monitor, suspect demand ischemia from CHF.   Prolonged QT interval (QTc 5555mc) - Monitor on telemetry, minimize provocative medications  HTN: Chronic, stable - Holding norvasc, largely normotensive.  Schizoaffective disorder and history of EtOH, cocaine use: No evidence of psychosis. May be impacting adherence. Followed by Dr. ArAdele Schilderwhose last note states "I will continue Seroquel 300 mg at bedtime, trazodone 100 mg at bedtime, Wellbutrin SR 100 mg twice a day and reduce Abilify to 10 mg." - Continue abilify, wellbutrin, trazodone. Did not restart gabapentin and seroqeul with risk for sedation - UDS +cocaine.  Morbid obesity: Noted - Continue outpatient evaluation  DVT prophylaxis: Lovenox Code Status: Full Family Communication: None at bedside today. Disposition Plan: Home once improved, possibly in next 24-48 hrs.  Consultants:   None  Procedures:   Echo 07/25/2017: - Left ventricle: The cavity size was normal. There was mild   concentric hypertrophy. Systolic function was normal. The   estimated ejection fraction was in the range of 60% to 65%.   Although no diagnostic regional wall motion abnormality was   identified, this possibility cannot be completely excluded on the   basis of this study. Left ventricular diastolic function   parameters were normal. - Ventricular septum: The contour showed diastolic flattening and   systolic flattening. These changes  are consistent with RV volume   and pressure overload. - Left atrium: The atrium was mildly dilated. - Right ventricle: The cavity size was mildly dilated. - Right atrium: The atrium was moderately to severely  dilated.  Impressions: - Pulmonary artery pressure cannot be directly estimated due to   absence of TR jet. However, 2D findings suggest at least moderate   pulmonary HTN. Also suggested is the presence of right heart   volume overload, despite the absence of tricuspid or pulmonary   regurgitation. Consider left to right shunt (occult ASD or   anomalous pulmonary vein return), especially in the absence of   other causes of pulmonary HTN such as severe lung disease, sleep   apnea, etc.  Antimicrobials:  Doxycycline 1/26 >>   Subjective: Still on increased oxygen, had good diuresis. No pain. No chest pain or fever.   Objective: Vitals:   07/26/17 0429 07/26/17 0755 07/26/17 0821 07/26/17 1232  BP: (!) 110/57  109/61 (!) 150/93  Pulse: (!) 101  87 (!) 101  Resp: 20  17   Temp: 98.6 F (37 C)  98.1 F (36.7 C) 98.7 F (37.1 C)  TempSrc: Oral  Oral Oral  SpO2: 91% 92% 95% 93%  Weight: 114.5 kg (252 lb 8 oz)     Height:        Intake/Output Summary (Last 24 hours) at 07/26/2017 1305 Last data filed at 07/26/2017 0900 Gross per 24 hour  Intake 840 ml  Output 2400 ml  Net -1560 ml   Filed Weights   07/24/17 1908 07/25/17 0152 07/26/17 0429  Weight: 113.4 kg (250 lb) 119.2 kg (262 lb 12.6 oz) 114.5 kg (252 lb 8 oz)    Gen: Obese 52 y.o. female in no distress  Pulm: Non-labored with bibasilar crackles.  CV: Regular rate and rhythm. No murmur, rub, or gallop. Unable to appreciate JVD. Has diffuse pitting edema, decreased from previously. GI: Abdomen soft, non-tender, non-distended, with normoactive bowel sounds. No organomegaly or masses felt. Ext: Warm, no deformities Skin: Left breast with decreased edema, no nipple discharge/bleeding, very faint erythema that is nontender. Neuro: Alert and oriented. No focal neurological deficits. Psych: Judgement and insight appear fair. Neurovegetative. Mood withdrawn & affect flat   CBC: Recent Labs  Lab 07/24/17 1911  07/25/17 0206  WBC 9.3 8.4  HGB 14.2 14.6  HCT 51.7* 50.5*  MCV 72.8* 72.6*  PLT 278 818   Basic Metabolic Panel: Recent Labs  Lab 07/24/17 1911 07/25/17 0206 07/26/17 0546  NA 134* 139 140  K 4.1 4.0 3.1*  CL 97* 97* 95*  CO2 _0 GLUCOSE 116* 106* 90  BUN _1 CREATININE 0.98 1.23* 0.74  CALCIUM 8.2* 8.2* 7.9*  MG  --  1.4*  --    Liver Function Tests: Recent Labs  Lab 07/25/17 0206  AST 32  ALT 21  ALKPHOS 76  BILITOT 1.2  PROT 6.1*  ALBUMIN 2.3*   Cardiac Enzymes: Recent Labs  Lab 07/25/17 0206 07/25/17 0752  TROPONINI 0.04* 0.04*   Urine analysis:    Component Value Date/Time   COLORURINE AMBER (A) 03/24/2016 1530   APPEARANCEUR CLOUDY (A) 03/24/2016 1530   LABSPEC 1.024 03/24/2016 1530   PHURINE 6.0 03/24/2016 1530   GLUCOSEU NEGATIVE 03/24/2016 1530   HGBUR NEGATIVE 03/24/2016 1530   BILIRUBINUR NEGATIVE 03/24/2016 1530   KETONESUR NEGATIVE 03/24/2016 1530   PROTEINUR 30 (A) 03/24/2016 1530   UROBILINOGEN 0.2 07/13/2014 1122   NITRITE  NEGATIVE 03/24/2016 [place] 03/24/2016 1530   Recent Results (from the past 240 hour(s))  MRSA PCR Screening     Status: None   Collection Time: 07/25/17  1:53 AM  Result Value Ref Range Status   MRSA by PCR NEGATIVE NEGATIVE Final    Comment:        The GeneXpert MRSA Assay (FDA approved for NASAL specimens only), is one component of a comprehensive MRSA colonization surveillance program. It is not intended to diagnose MRSA infection nor to guide or monitor treatment for MRSA infections.       Radiology Studies: Dg Chest Portable 1 View  Result Date: 07/24/2017 CLINICAL DATA:  Dyspnea EXAM: PORTABLE CHEST 1 VIEW COMPARISON:  05/09/2017 chest radiograph. FINDINGS: Stable cardiomediastinal silhouette with mild cardiomegaly. No pneumothorax. No pleural effusion. Diffuse hazy and linear parahilar opacities in both lungs. IMPRESSION: Stable cardiomegaly with hazy and linear  parahilar opacities throughout both lungs, most compatible with moderate congestive heart failure. Electronically Signed   By: Ilona Sorrel M.D.   On: 07/24/2017 20:05    Scheduled Meds: . ARIPiprazole  10 mg Oral Daily  . aspirin EC  81 mg Oral Daily  . buPROPion  150 mg Oral BID  . doxycycline  100 mg Oral Q12H  . enoxaparin (LOVENOX) injection  40 mg Subcutaneous Q24H  . furosemide  20 mg Intravenous Q12H  . Influenza vac split quadrivalent PF  0.5 mL Intramuscular Tomorrow-1000  . lisinopril  2.5 mg Oral Daily  . mometasone-formoterol  2 puff Inhalation BID  . nicotine  14 mg Transdermal Daily  . sodium chloride flush  3 mL Intravenous Q12H  . traZODone  100 mg Oral QHS   Continuous Infusions: . sodium chloride    . nitroGLYCERIN 5 mcg/min (07/24/17 2007)     LOS: 2 days   Time spent: 25 minutes.  Vance Gather, MD [facility] Pager [phone]  If 7PM-7AM, please contact night-coverage [email or web address] Password [email or web address] 07/26/2017, 1:05 PM

## 2017-07-26 NOTE — ED Provider Notes (Signed)
[facility] PROGRESSIVE CARE Provider Note   CSN: [id number] Arrival date & time: 07/24/17  1843     History   Chief Complaint Chief Complaint  Patient presents with  . Swelling  . Weakness    HPI Diane Gilbert is a 52 y.o. female.  HPI Patient presents to the emergency department with increasing shortness of breath and edema.  The patient states that she is been out of her Lasix for over 1 month.  She states that her edema increased over the last week.  She states she feels like her legs abdomen arms are swollen.  Patient states that nothing seems to make the condition better.  The patient denies chest pain,headache,blurred vision, neck pain, fever, cough, weakness, numbness, dizziness, anorexia, edema, abdominal pain, nausea, vomiting, diarrhea, rash, back pain, dysuria, hematemesis, bloody stool, near syncope, or syncope. Past Medical History:  Diagnosis Date  . Alcohol abuse   . Anxiety   . Arthritis   . CHF (congestive heart failure) ([place])   . Depression   . Headache(784.0)   . Hypertension   . Insomnia   . Interstitial lung disease ([place])   . Oxygen dependent   . Schizophrenia ([place])   . Shortness of breath dyspnea   . Sleep apnea   . Sleep apnea     Patient Active Problem List   Diagnosis Date Noted  . Acute diastolic heart failure ([place]) 07/24/2017  . Acute on chronic diastolic CHF (congestive heart failure) ([place]) 05/09/2017  . Respiratory failure, acute and chronic ([place]) 05/09/2017  . Facial swelling 03/23/2017  . Chronic respiratory failure with hypoxia ([place]) 03/23/2017  . Acute respiratory failure with hypoxia ([place]) 03/23/2017  . Community acquired pneumonia   . OSA (obstructive sleep apnea) 07/21/2016  . Increased oxygen demand 07/04/2016  . Acute diastolic congestive heart failure ([place])   . Hypoxia 03/24/2016  . Schizophrenia ([place]) 03/24/2016  . Leg swelling 01/16/2016  . Dyspnea 09/06/2015  . Acute on chronic respiratory failure with  hypoxia ([place]) 06/07/2014  . Respiratory bronchiolitis interstitial lung disease ([place]) 12/26/2013  . Irregular menstrual cycle 01/23/2013  . Smoking addiction 01/23/2013  . Alcohol abuse 02/19/2012  . Hyponatremia 02/19/2012  . Abdominal pain 02/19/2012  . Hypokalemia 02/19/2012    Past Surgical History:  Procedure Laterality Date  . BACK SURGERY    . LUNG BIOPSY Left 07/16/2014   Procedure: LUNG BIOPSY;  Surgeon: Grace Isaac, MD;  Location: [facility];  Service: Thoracic;  Laterality: Left;  Marland Kitchen MULTIPLE TOOTH EXTRACTIONS    . VIDEO ASSISTED THORACOSCOPY Left 07/16/2014   Procedure: VIDEO ASSISTED THORACOSCOPY;  Surgeon: Grace Isaac, MD;  Location: [facility];  Service: Thoracic;  Laterality: Left;  Marland Kitchen VIDEO BRONCHOSCOPY Bilateral 04/11/2014   Procedure: VIDEO BRONCHOSCOPY WITH FLUORO;  Surgeon: Kathee Delton, MD;  Location: [facility];  Service: Cardiopulmonary;  Laterality: Bilateral;  . VIDEO BRONCHOSCOPY N/A 07/16/2014   Procedure: VIDEO BRONCHOSCOPY;  Surgeon: Grace Isaac, MD;  Location[facility];  Service: Thoracic;  Laterality: N/A;  . WEDGE RESECTION Left 07/16/2014   Procedure: WEDGE RESECTION;  Surgeon: Grace Isaac, MD;  Location: [facility];  Service: Thoracic;  Laterality: Left;  left upper lobe lung resection    OB History    Gravida Para Term Preterm AB Living   0 0 0 0 0 0   SAB TAB Ectopic Multiple Live Births   0 0 0 0         Home Medications  Prior to Admission medications   Medication Sig Start Date End Date Taking? Authorizing Provider  OXYGEN Inhale 2 L into the lungs continuous.   Yes [provider]  traZODone (DESYREL) 100 MG tablet Take 1 tablet (100 mg total) by mouth at bedtime. 06/18/17  Yes Arfeen, Arlyce Harman, MD  acetaminophen (TYLENOL) 325 MG tablet Take 2 tablets (650 mg total) by mouth every 6 (six) hours as needed for mild pain (or Fever >/= 101). 03/26/17   Velvet Bathe, MD  albuterol (PROVENTIL HFA;VENTOLIN HFA) 108 (90 BASE)  MCG/ACT inhaler Inhale 2 puffs into the lungs every 6 (six) hours as needed for wheezing or shortness of breath. 05/09/15   Juanito Doom, MD  amLODipine (NORVASC) 5 MG tablet Take 5 mg daily by mouth.    [provider]  ARIPiprazole (ABILIFY) 10 MG tablet Take 1 tablet (10 mg total) by mouth daily. 06/18/17   Arfeen, Arlyce Harman, MD  budesonide-formoterol (SYMBICORT) 160-4.5 MCG/ACT inhaler Inhale 2 puffs into the lungs 2 (two) times daily. 03/23/17   Parrett, Fonnie Mu, NP  buPROPion (WELLBUTRIN SR) 150 MG 12 hr tablet Take 1 tablet (150 mg total) by mouth See admin instructions. Take 1 tablet (150 mg) by mouth every morning and at noon 06/18/17   Arfeen, Arlyce Harman, MD  furosemide (LASIX) 20 MG tablet Take 1 tablet (20 mg total) by mouth daily. 06/11/17   Parrett, Fonnie Mu, NP  gabapentin (NEURONTIN) 300 MG capsule Take 300-600 mg See admin instructions by mouth. 360m by mouth three times daily, 6020mat bedtime    [provider]  guaiFENesin (MUCINEX) 600 MG 12 hr tablet Take 1 tablet (600 mg total) 2 (two) times daily by mouth. 05/11/17   Vann, JeTomi BambergerDO  nicotine (NICODERM CQ - DOSED IN MG/24 HOURS) 14 mg/24hr patch Place 1 patch (14 mg total) daily onto the skin. Patient not taking: Reported on 06/11/2017 05/12/17   VaGeradine GirtDO  oxyCODONE-acetaminophen (PERCOCET/ROXICET) 5-325 MG tablet Take 1 tablet every 6 (six) hours as needed by mouth for pain. 04/30/17   [provider]  QUEtiapine (SEROQUEL) 300 MG tablet Take 1 tablet (300 mg total) by mouth at bedtime. 06/18/17   ArKathlee NationsMD    Family History Family History  Problem Relation Age of Onset  . Breast cancer Mother   . Obstructive Sleep Apnea Mother   . COPD Maternal Aunt     Social History Social History   Tobacco Use  . Smoking status: Current Some Day Smoker    Packs/day: 1.50    Years: 30.00    Pack years: 45.00    Types: Cigarettes  . Smokeless tobacco: Never Used  . Tobacco  comment: smoking 3 cigarettes daily "every now and then" 11/04/16  Substance Use Topics  . Alcohol use: No    Alcohol/week: 0.0 oz    Frequency: Never    Comment: Pt has previously abused alcohol , pt states she still drinks "every now and then"  . Drug use: No     Allergies   Patient has no known allergies.   Review of Systems Review of Systems  All other systems negative except as documented in the HPI. All pertinent positives and negatives as reviewed in the HPI. Physical Exam Updated Vital Signs BP 121/70 (BP Location: Right Arm)   Pulse 99   Temp 98.2 F (36.8 C) (Oral)   Resp 20   Ht _0  (1.626 m)   Wt 119.2  kg (262 lb 12.6 oz)   SpO2 94%   BMI 45.11 kg/m   Physical Exam  Constitutional: She is oriented to person, place, and time. She appears well-developed and well-nourished. No distress.  HENT:  Head: Normocephalic and atraumatic.  Mouth/Throat: Oropharynx is clear and moist.  Eyes: Pupils are equal, round, and reactive to light.  Neck: Normal range of motion. Neck supple.  Cardiovascular: Normal rate, regular rhythm and normal heart sounds. Exam reveals no gallop and no friction rub.  No murmur heard. Pulmonary/Chest: Effort normal. No respiratory distress. She has no wheezes. She has rales.  Abdominal: Soft. Bowel sounds are normal. She exhibits no distension. There is no tenderness.    Musculoskeletal: She exhibits edema.  Neurological: She is alert and oriented to person, place, and time. She exhibits normal muscle tone. Coordination normal.  Skin: Skin is warm and dry. Capillary refill takes less than 2 seconds. No rash noted. No erythema.  Psychiatric: She has a normal mood and affect. Her behavior is normal.  Nursing note and vitals reviewed.    ED Treatments / Results  Labs (all labs ordered are listed, but only abnormal results are displayed) Labs Reviewed  BASIC METABOLIC PANEL - Abnormal; Notable for the following components:      Result  Value   Sodium 134 (*)    Chloride 97 (*)    Glucose, Bld 116 (*)    Calcium 8.2 (*)    All other components within normal limits  CBC - Abnormal; Notable for the following components:   RBC 7.10 (*)    HCT 51.7 (*)    MCV 72.8 (*)    MCH 20.0 (*)    MCHC 27.5 (*)    RDW 22.5 (*)    All other components within normal limits  BRAIN NATRIURETIC PEPTIDE - Abnormal; Notable for the following components:   B Natriuretic Peptide 467.5 (*)    All other components within normal limits  BASIC METABOLIC PANEL - Abnormal; Notable for the following components:   Chloride 97 (*)    Glucose, Bld 106 (*)    Creatinine, Ser 1.23 (*)    Calcium 8.2 (*)    GFR calc non Af Amer 51 (*)    GFR calc Af Amer 59 (*)    All other components within normal limits  CBC - Abnormal; Notable for the following components:   RBC 6.96 (*)    HCT 50.5 (*)    MCV 72.6 (*)    MCH 21.0 (*)    MCHC 28.9 (*)    RDW 22.5 (*)    All other components within normal limits  MAGNESIUM - Abnormal; Notable for the following components:   Magnesium 1.4 (*)    All other components within normal limits  HEPATIC FUNCTION PANEL - Abnormal; Notable for the following components:   Total Protein 6.1 (*)    Albumin 2.3 (*)    All other components within normal limits  TROPONIN I - Abnormal; Notable for the following components:   Troponin I 0.04 (*)    All other components within normal limits  TROPONIN I - Abnormal; Notable for the following components:   Troponin I 0.04 (*)    All other components within normal limits  RAPID URINE DRUG SCREEN, HOSP PERFORMED - Abnormal; Notable for the following components:   Cocaine POSITIVE (*)    All other components within normal limits  MRSA PCR SCREENING  BASIC METABOLIC PANEL  I-STAT TROPONIN, ED  I-STAT BETA  HCG BLOOD, ED ([facility], [facility], [facility] ONLY)    EKG  EKG Interpretation  Date/Time:  Saturday July 24 2017 19:04:57 EST Ventricular Rate:  97 PR Interval:  176 QRS  Duration: 50 QT Interval:  440 QTC Calculation: 558 R Axis:   152 Text Interpretation:  Sinus rhythm with occasional Premature ventricular complexes Right atrial enlargement Right axis deviation Pulmonary disease pattern Septal infarct , age undetermined Prolonged QT Abnormal ECG Confirmed by Fredia Sorrow [phone]) on 07/25/2017 1:21:11 PM       Radiology Dg Chest Portable 1 View  Result Date: 07/24/2017 CLINICAL DATA:  Dyspnea EXAM: PORTABLE CHEST 1 VIEW COMPARISON:  05/09/2017 chest radiograph. FINDINGS: Stable cardiomediastinal silhouette with mild cardiomegaly. No pneumothorax. No pleural effusion. Diffuse hazy and linear parahilar opacities in both lungs. IMPRESSION: Stable cardiomegaly with hazy and linear parahilar opacities throughout both lungs, most compatible with moderate congestive heart failure. Electronically Signed   By: Ilona Sorrel M.D.   On: 07/24/2017 20:05    Procedures Procedures (including critical care time)  Medications Ordered in ED Medications  nitroGLYCERIN 50 mg in dextrose 5 % 250 mL (0.2 mg/mL) infusion (5 mcg/min Intravenous Transfusing/Transfer 07/25/17 0118)  acetaminophen (TYLENOL) tablet 650 mg (650 mg Oral Given 07/25/17 0014)  albuterol (PROVENTIL) (2.5 MG/3ML) 0.083% nebulizer solution 3 mL (not administered)  mometasone-formoterol (DULERA) 200-5 MCG/ACT inhaler 2 puff (2 puffs Inhalation Given 07/25/17 2108)  nicotine (NICODERM CQ - dosed in mg/24 hours) patch 14 mg (14 mg Transdermal Patch Applied 07/25/17 0915)  traZODone (DESYREL) tablet 100 mg (100 mg Oral Given 07/25/17 2128)  sodium chloride flush (NS) 0.9 % injection 3 mL (3 mLs Intravenous Given 07/25/17 2129)  sodium chloride flush (NS) 0.9 % injection 3 mL (not administered)  0.9 %  sodium chloride infusion (not administered)  enoxaparin (LOVENOX) injection 40 mg (40 mg Subcutaneous Given 07/25/17 0914)  furosemide (LASIX) injection 20 mg (20 mg Intravenous Given 07/25/17 2128)  lisinopril  (PRINIVIL,ZESTRIL) tablet 2.5 mg (2.5 mg Oral Given 07/25/17 0903)  aspirin EC tablet 81 mg (81 mg Oral Given 07/25/17 0904)  doxycycline (VIBRA-TABS) tablet 100 mg (100 mg Oral Given 07/25/17 1411)  Influenza vac split quadrivalent PF (FLUARIX) injection 0.5 mL (not administered)  ARIPiprazole (ABILIFY) tablet 10 mg (10 mg Oral Given 07/25/17 1412)  buPROPion (WELLBUTRIN SR) 12 hr tablet 150 mg (150 mg Oral Given 07/25/17 1411)  furosemide (LASIX) injection 80 mg (80 mg Intravenous Given 07/24/17 2006)     Initial Impression / Assessment and Plan / ED Course  I have reviewed the triage vital signs and the nursing notes.  Pertinent labs & imaging results that were available during my care of the patient were reviewed by me and considered in my medical decision making (see chart for details).     CRITICAL CARE Performed by: Resa Miner Dearra Myhand Total critical care time:45 minutes Critical care time was exclusive of separately billable procedures and treating other patients. Critical care was necessary to treat or prevent imminent or life-threatening deterioration. Critical care was time spent personally by me on the following activities: development of treatment plan with patient and/or surrogate as well as nursing, discussions with consultants, evaluation of patient's response to treatment, examination of patient, obtaining history from patient or surrogate, ordering and performing treatments and interventions, ordering and review of laboratory studies, ordering and review of radiographic studies, pulse oximetry and re-evaluation of patient's condition.  Patient required nonrebreather for oxygen supplementation initially she also did receive a nitroglycerin drip along with  IV Lasix.  The patient seemed to become more stable after these medications.  She will be sent to stepdown for further evaluation of her CHF.  Patient is advised the plan and all questions were answered I did speak with the [facility] about the patient. Final Clinical Impressions(s) / ED Diagnoses   Final diagnoses:  None    ED Discharge Orders    None       Dalia Heading, Hershal Coria 07/26/17 0132    Virgel Manifold, MD 08/02/17 [phone]

## 2017-07-26 NOTE — Progress Notes (Signed)
Pt has ambulated  for 200 feet on oxygen at 4 L per nasal cannula . Oxygen saturation had ranged from 82% to 97 %. Pt indicated she was not short of breath while she was ambulating.

## 2017-07-27 DIAGNOSIS — I5043 Acute on chronic combined systolic (congestive) and diastolic (congestive) heart failure: Secondary | ICD-10-CM

## 2017-07-27 LAB — BASIC METABOLIC PANEL
ANION GAP: 10 (ref 5–15)
BUN: 5 mg/dL — ABNORMAL LOW (ref 6–20)
CO2: 34 mmol/L — ABNORMAL HIGH (ref 22–32)
Calcium: 8 mg/dL — ABNORMAL LOW (ref 8.9–10.3)
Chloride: 98 mmol/L — ABNORMAL LOW (ref 101–111)
Creatinine, Ser: 0.61 mg/dL (ref 0.44–1.00)
Glucose, Bld: 93 mg/dL (ref 65–99)
Potassium: 3.6 mmol/L (ref 3.5–5.1)
SODIUM: 142 mmol/L (ref 135–145)

## 2017-07-27 LAB — MAGNESIUM: MAGNESIUM: 1.3 mg/dL — AB (ref 1.7–2.4)

## 2017-07-27 MED ORDER — ONDANSETRON HCL 4 MG/2ML IJ SOLN
4.0000 mg | Freq: Four times a day (QID) | INTRAMUSCULAR | Status: DC | PRN
  Administered 2017-07-27: 4 mg via INTRAVENOUS
  Filled 2017-07-27: qty 2

## 2017-07-27 MED ORDER — MAGNESIUM SULFATE 2 GM/50ML IV SOLN
2.0000 g | Freq: Once | INTRAVENOUS | Status: AC
  Administered 2017-07-27: 2 g via INTRAVENOUS
  Filled 2017-07-27: qty 50

## 2017-07-27 MED ORDER — DOXYCYCLINE HYCLATE 100 MG PO TABS: 100.0000 mg | ORAL_TABLET | Freq: Two times a day (BID) | ORAL | 0 refills | Status: DC

## 2017-07-27 MED ORDER — FUROSEMIDE 20 MG PO TABS: 20.0000 mg | ORAL_TABLET | Freq: Every day | ORAL | 0 refills | Status: DC

## 2017-07-27 NOTE — Discharge Summary (Signed)
Physician Discharge Summary  Diane Gilbert BTD:[id number] DOB: [birth year] DOA: 07/24/2017  PCP: Nolene Ebbs, MD  Admit date: 07/24/2017 Discharge date: 07/27/2017  Admitted From: Home Disposition: Home   Recommendations for Outpatient Follow-up:  1. Follow up with PCP in 1-2 weeks 2. Follow up with pulmonology, Dr. Lake Bells. 3. Substance abuse cessation counseling. +Cocaine UDS.  Home Health: None Equipment/Devices: Continue supplemental oxygen Discharge Condition: Stable CODE STATUS: Full Diet recommendation: Heart healthy  Brief/Interim Summary: Diane Gilbert is a 52 y.o. female with a history of chronic respiratory failure due to ILD, OSA not tolerant of CPAP, chronic HFpEF, HTN, and schizoaffective disorder who presented to the ED with 1 month of slowly progressive dyspnea and edema in the setting of not taking lasix. She reported new 6 pillow orthopnea and weight gain. Also incidentally noted nontender left breast swelling thought to be related to progressive edema extending from legs, now involving abdomen and arms. On initial evaluation she was hypoxic to 81% on home 2L O2, placed on NRB. BNP elevated to 467, ECG unchanged, troponin negative, no chest pain, and CXR demonstrated pulmonary edema, stable cardiomegaly, so IV lasix and NTG gtt started. Brisk diuresis with rapid improvement in symptoms, improvement in creatinine, below previous DC weight at 247lbs at discharge here.  Discharge Diagnoses:  Active Problems:   Respiratory bronchiolitis interstitial lung disease (HCC)   Acute on chronic respiratory failure with hypoxia (HCC)   OSA (obstructive sleep apnea)   Acute on chronic diastolic CHF (congestive heart failure) (HCC)  Acute on chronic hypoxic respiratory failure: Due to pulmonary edema in setting of medication nonadherence on chronic ILD, untreated OSA, and ongoing tobacco use. Do not see indication of ILD flare at this time.  - Continue diuresis with  home lasix (pt has at her pharmacy and agrees to take as directed) - Continue home oxygen - Nicotine patch, cessation counseling provided - Unable to tolerate CPAP, so this is not ordered.  Acute on chronic HFpEF with right heart failure: Weight at DC 247lbs.  - Continue diuresis with home lasix.  Hypokalemia: Resolved with replacement.    Left breast swelling, erythema: Erythema is mild, not tender or warm, resolved. No red flags, no palpable masses and screening mammogram negative 6 months ago.  - Suspect this is related to volume overload, improved.  - Possibly could have been cellulitis that has responded quickly to treatment, so will complete 5 days doxycycline.   Troponin elevation: Very mild, plateau at 0.04 with ECG on admission personally reviewed, showed NSR w/low voltage, RAD with RAE and no overt ischemia.  - Monitor, suspect demand ischemia from CHF.   Prolonged QT interval (QTc 557mec) - Minimize provocative medications  HTN: Chronic, stable - Continue home medications.  Schizoaffective disorder and history of EtOH, cocaine use: No evidence of psychosis. May be impacting adherence. Followed by Dr. AAdele Schilder whose last note states "I will continue Seroquel 300 mg at bedtime, trazodone 100 mg at bedtime, Wellbutrin SR 100 mg twice a day and reduce Abilify to 10 mg." - Continue home medications, will need psychiatry follow up. - UDS +cocaine.  Morbid obesity: Noted - Continue outpatient evaluation  Discharge Instructions Discharge Instructions    Diet - low sodium heart healthy   Complete by:  As directed    Discharge instructions   Complete by:  As directed    Pick up the prescription for lasix and start taking it today.  - Also pick up a prescription for doxycycline to be taken  twice daily for the next 3 days to complete treatment for left breast cellulitis.  - Call your PCP and pulmonologist to schedule follow up as soon as possible. - Return for care if  you continue swelling or have worsening shortness of breath despite taking the lasix as directed.   Increase activity slowly   Complete by:  As directed      Allergies as of 07/27/2017   No Known Allergies     Medication List    TAKE these medications   acetaminophen 325 MG tablet Commonly known as:  TYLENOL Take 2 tablets (650 mg total) by mouth every 6 (six) hours as needed for mild pain (or Fever >/= 101).   albuterol 108 (90 Base) MCG/ACT inhaler Commonly known as:  PROVENTIL HFA;VENTOLIN HFA Inhale 2 puffs into the lungs every 6 (six) hours as needed for wheezing or shortness of breath.   amLODipine 5 MG tablet Commonly known as:  NORVASC Take 5 mg daily by mouth.   ARIPiprazole 20 MG tablet Commonly known as:  ABILIFY Take 20 mg by mouth daily. What changed:  Another medication with the same name was removed. Continue taking this medication, and follow the directions you see here.   budesonide-formoterol 160-4.5 MCG/ACT inhaler Commonly known as:  SYMBICORT Inhale 2 puffs into the lungs 2 (two) times daily.   buPROPion 150 MG 12 hr tablet Commonly known as:  WELLBUTRIN SR Take 1 tablet (150 mg total) by mouth See admin instructions. Take 1 tablet (150 mg) by mouth every morning and at noon   doxycycline 100 MG tablet Commonly known as:  VIBRA-TABS Take 1 tablet (100 mg total) by mouth every 12 (twelve) hours.   furosemide 20 MG tablet Commonly known as:  LASIX Take 1 tablet (20 mg total) by mouth daily.   gabapentin 300 MG capsule Commonly known as:  NEURONTIN Take 300-600 mg See admin instructions by mouth. 358m by mouth three times daily, 6036mat bedtime   guaiFENesin 600 MG 12 hr tablet Commonly known as:  MUCINEX Take 1 tablet (600 mg total) 2 (two) times daily by mouth.   nicotine 14 mg/24hr patch Commonly known as:  NICODERM CQ - dosed in mg/24 hours Place 1 patch (14 mg total) daily onto the skin.   oxyCODONE-acetaminophen 5-325 MG  tablet Commonly known as:  PERCOCET/ROXICET Take 1 tablet every 6 (six) hours as needed by mouth for pain.   OXYGEN Inhale 2 L into the lungs continuous.   QUEtiapine 300 MG tablet Commonly known as:  SEROQUEL Take 1 tablet (300 mg total) by mouth at bedtime.   traZODone 100 MG tablet Commonly known as:  DESYREL Take 1 tablet (100 mg total) by mouth at bedtime.      Follow-up Information    AvNolene EbbsMD Follow up.   Specialty:  Internal Medicine Contact information: [phone]        ClKathee DeltonMD .   Specialty:  Pulmonary Disease         No Known Allergies  Consultations:  None  Procedures/Studies: Dg Chest Portable 1 View  Result Date: 07/24/2017 CLINICAL DATA:  Dyspnea EXAM: PORTABLE CHEST 1 VIEW COMPARISON:  05/09/2017 chest radiograph. FINDINGS: Stable cardiomediastinal silhouette with mild cardiomegaly. No pneumothorax. No pleural effusion. Diffuse hazy and linear parahilar opacities in both lungs. IMPRESSION: Stable cardiomegaly with hazy and linear parahilar opacities throughout both lungs, most compatible with moderate congestive heart failure. Electronically Signed   By:  Ilona Sorrel M.D.   On: 07/24/2017 20:05     Echo 07/25/2017: - Left ventricle: The cavity size was normal. There was mild concentric hypertrophy. Systolic function was normal. The estimated ejection fraction was in the range of 60% to 65%. Although no diagnostic regional wall motion abnormality was identified, this possibility cannot be completely excluded on the basis of this study. Left ventricular diastolic function parameters were normal. - Ventricular septum: The contour showed diastolic flattening and systolic flattening. These changes are consistent with RV volume and pressure overload. - Left atrium: The atrium was mildly dilated. - Right ventricle: The cavity size was mildly dilated. - Right atrium: The  atrium was moderately to severely dilated.  Impressions: - Pulmonary artery pressure cannot be directly estimated due to absence of TR jet. However, 2D findings suggest at least moderate pulmonary HTN. Also suggested is the presence of right heart volume overload, despite the absence of tricuspid or pulmonary regurgitation. Consider left to right shunt (occult ASD or anomalous pulmonary vein return), especially in the absence of other causes of pulmonary HTN such as severe lung disease, sleep apnea, etc.  Subjective: Breathing back to baseline, ambulating, eating. No chest pain, swelling improved.   Discharge Exam: Vitals:   07/27/17 1036 07/27/17 1205  BP: 115/61 119/68  Pulse:  90  Resp:    Temp:  98 F (36.7 C)  SpO2:  94%   Gen: Obese 52 y.o. female in no distress  Pulm: Non-labored and clear  CV: Regular rate and rhythm. No murmur, rub, or gallop. Unable to appreciate JVD. Has trace diffuse pitting edema. GI: Abdomen soft, non-tender, non-distended, with normoactive bowel sounds. No organomegaly or masses felt. Ext: Warm, no deformities Skin: Left breast with decreased edema, no nipple discharge/bleeding, very faint erythema that is nontender. Neuro: Alert and oriented. No focal neurological deficits. Psych: Judgement and insight appear fair. Neurovegetative. Mood withdrawn & affect flat  Labs: BNP (last 3 results) Recent Labs    03/23/17 1842 05/09/17 1201 07/24/17 1911  BNP 59.5 140.4* 756.4*   Basic Metabolic Panel: Recent Labs  Lab 07/24/17 1911 07/25/17 0206 07/26/17 0546 07/27/17 0422  NA 134* 139 140 142  K 4.1 4.0 3.1* 3.6  CL 97* 97* 95* 98*  CO2 _0 34*  GLUCOSE 116* 106* 90 93  BUN _1 5*  CREATININE 0.98 1.23* 0.74 0.61  CALCIUM 8.2* 8.2* 7.9* 8.0*  MG  --  1.4*  --  1.3*   Liver Function Tests: Recent Labs  Lab 07/25/17 0206  AST 32  ALT 21  ALKPHOS 76  BILITOT 1.2  PROT 6.1*  ALBUMIN 2.3*   CBC: Recent  Labs  Lab 07/24/17 1911 07/25/17 0206  WBC 9.3 8.4  HGB 14.2 14.6  HCT 51.7* 50.5*  MCV 72.8* 72.6*  PLT 278 257   Cardiac Enzymes: Recent Labs  Lab 07/25/17 0206 07/25/17 0752  TROPONINI 0.04* 0.04*   Microbiology Recent Results (from the past 240 hour(s))  MRSA PCR Screening     Status: None   Collection Time: 07/25/17  1:53 AM  Result Value Ref Range Status   MRSA by PCR NEGATIVE NEGATIVE Final    Comment:        The GeneXpert MRSA Assay (FDA approved for NASAL specimens only), is one component of a comprehensive MRSA colonization surveillance program. It is not intended to diagnose MRSA infection nor to guide or monitor treatment for MRSA infections.     Time coordinating discharge:  Approximately 40 minutes  Vance Gather, MD  [facility] 07/27/2017, 1:15 PM Pager [phone]

## 2017-08-03 ENCOUNTER — Ambulatory Visit (HOSPITAL_COMMUNITY): Payer: Self-pay | Admitting: Psychiatry

## 2017-08-09 ENCOUNTER — Encounter: Payer: Self-pay | Admitting: Adult Health

## 2017-08-09 ENCOUNTER — Inpatient Hospital Stay (HOSPITAL_COMMUNITY): Payer: Medicaid Other

## 2017-08-09 ENCOUNTER — Ambulatory Visit (INDEPENDENT_AMBULATORY_CARE_PROVIDER_SITE_OTHER): Payer: Medicaid Other | Admitting: Adult Health

## 2017-08-09 ENCOUNTER — Inpatient Hospital Stay (HOSPITAL_COMMUNITY)
Admission: AD | Admit: 2017-08-09 | Discharge: 2017-08-13 | DRG: 291 | Disposition: A | Discharge status: Home Health | Payer: Medicaid Other | Attending: Internal Medicine | Admitting: Internal Medicine

## 2017-08-09 DIAGNOSIS — J9621 Acute and chronic respiratory failure with hypoxia: Secondary | ICD-10-CM | POA: Diagnosis present

## 2017-08-09 DIAGNOSIS — G4700 Insomnia, unspecified: Secondary | ICD-10-CM | POA: Diagnosis present

## 2017-08-09 DIAGNOSIS — I2729 Other secondary pulmonary hypertension: Secondary | ICD-10-CM | POA: Diagnosis present

## 2017-08-09 DIAGNOSIS — F259 Schizoaffective disorder, unspecified: Secondary | ICD-10-CM | POA: Diagnosis present

## 2017-08-09 DIAGNOSIS — F419 Anxiety disorder, unspecified: Secondary | ICD-10-CM | POA: Diagnosis present

## 2017-08-09 DIAGNOSIS — J9622 Acute and chronic respiratory failure with hypercapnia: Secondary | ICD-10-CM | POA: Diagnosis present

## 2017-08-09 DIAGNOSIS — E662 Morbid (severe) obesity with alveolar hypoventilation: Secondary | ICD-10-CM | POA: Diagnosis present

## 2017-08-09 DIAGNOSIS — M1990 Unspecified osteoarthritis, unspecified site: Secondary | ICD-10-CM | POA: Diagnosis present

## 2017-08-09 DIAGNOSIS — R0602 Shortness of breath: Secondary | ICD-10-CM | POA: Diagnosis present

## 2017-08-09 DIAGNOSIS — Z79899 Other long term (current) drug therapy: Secondary | ICD-10-CM

## 2017-08-09 DIAGNOSIS — J9690 Respiratory failure, unspecified, unspecified whether with hypoxia or hypercapnia: Secondary | ICD-10-CM

## 2017-08-09 DIAGNOSIS — J984 Other disorders of lung: Secondary | ICD-10-CM | POA: Diagnosis present

## 2017-08-09 DIAGNOSIS — J439 Emphysema, unspecified: Secondary | ICD-10-CM | POA: Diagnosis present

## 2017-08-09 DIAGNOSIS — E876 Hypokalemia: Secondary | ICD-10-CM | POA: Diagnosis present

## 2017-08-09 DIAGNOSIS — Z9981 Dependence on supplemental oxygen: Secondary | ICD-10-CM

## 2017-08-09 DIAGNOSIS — Z9119 Patient's noncompliance with other medical treatment and regimen: Secondary | ICD-10-CM | POA: Diagnosis not present

## 2017-08-09 DIAGNOSIS — I110 Hypertensive heart disease with heart failure: Principal | ICD-10-CM | POA: Diagnosis present

## 2017-08-09 DIAGNOSIS — I5033 Acute on chronic diastolic (congestive) heart failure: Secondary | ICD-10-CM | POA: Diagnosis present

## 2017-08-09 DIAGNOSIS — F329 Major depressive disorder, single episode, unspecified: Secondary | ICD-10-CM | POA: Diagnosis present

## 2017-08-09 DIAGNOSIS — R9431 Abnormal electrocardiogram [ECG] [EKG]: Secondary | ICD-10-CM | POA: Diagnosis present

## 2017-08-09 DIAGNOSIS — F1410 Cocaine abuse, uncomplicated: Secondary | ICD-10-CM | POA: Diagnosis present

## 2017-08-09 DIAGNOSIS — Z5320 Procedure and treatment not carried out because of patient's decision for unspecified reasons: Secondary | ICD-10-CM | POA: Diagnosis present

## 2017-08-09 DIAGNOSIS — I2781 Cor pulmonale (chronic): Secondary | ICD-10-CM | POA: Diagnosis present

## 2017-08-09 DIAGNOSIS — J9601 Acute respiratory failure with hypoxia: Secondary | ICD-10-CM

## 2017-08-09 DIAGNOSIS — F17210 Nicotine dependence, cigarettes, uncomplicated: Secondary | ICD-10-CM | POA: Diagnosis present

## 2017-08-09 DIAGNOSIS — F1010 Alcohol abuse, uncomplicated: Secondary | ICD-10-CM | POA: Diagnosis present

## 2017-08-09 DIAGNOSIS — Z6841 Body Mass Index (BMI) 40.0 and over, adult: Secondary | ICD-10-CM | POA: Diagnosis not present

## 2017-08-09 DIAGNOSIS — F1910 Other psychoactive substance abuse, uncomplicated: Secondary | ICD-10-CM | POA: Diagnosis not present

## 2017-08-09 DIAGNOSIS — Z7951 Long term (current) use of inhaled steroids: Secondary | ICD-10-CM

## 2017-08-09 DIAGNOSIS — J810 Acute pulmonary edema: Secondary | ICD-10-CM | POA: Diagnosis not present

## 2017-08-09 DIAGNOSIS — J849 Interstitial pulmonary disease, unspecified: Secondary | ICD-10-CM | POA: Diagnosis not present

## 2017-08-09 LAB — CBC WITH DIFFERENTIAL/PLATELET
Basophils Absolute: 0 10*3/uL (ref 0.0–0.1)
Basophils Relative: 0 %
EOS PCT: 1 %
Eosinophils Absolute: 0.1 10*3/uL (ref 0.0–0.7)
HEMATOCRIT: 51.5 % — AB (ref 36.0–46.0)
Hemoglobin: 14.2 g/dL (ref 12.0–15.0)
LYMPHS PCT: 35 %
Lymphs Abs: 3.3 10*3/uL (ref 0.7–4.0)
MCH: 20.3 pg — ABNORMAL LOW (ref 26.0–34.0)
MCHC: 27.6 g/dL — ABNORMAL LOW (ref 30.0–36.0)
MCV: 73.8 fL — AB (ref 78.0–100.0)
Monocytes Absolute: 1 10*3/uL (ref 0.1–1.0)
Monocytes Relative: 10 %
NEUTROS ABS: 5.2 10*3/uL (ref 1.7–7.7)
Neutrophils Relative %: 54 %
PLATELETS: 214 10*3/uL (ref 150–400)
RBC: 6.98 MIL/uL — AB (ref 3.87–5.11)
RDW: 24.1 % — ABNORMAL HIGH (ref 11.5–15.5)
WBC: 9.7 10*3/uL (ref 4.0–10.5)

## 2017-08-09 LAB — COMPREHENSIVE METABOLIC PANEL
ALT: 19 U/L (ref 14–54)
AST: 26 U/L (ref 15–41)
Albumin: 2.4 g/dL — ABNORMAL LOW (ref 3.5–5.0)
Alkaline Phosphatase: 80 U/L (ref 38–126)
Anion gap: 12 (ref 5–15)
CHLORIDE: 97 mmol/L — AB (ref 101–111)
CO2: 32 mmol/L (ref 22–32)
CREATININE: 0.64 mg/dL (ref 0.44–1.00)
Calcium: 8.4 mg/dL — ABNORMAL LOW (ref 8.9–10.3)
GFR calc non Af Amer: >60 mL/min (ref >60)
Glucose, Bld: 111 mg/dL — ABNORMAL HIGH (ref 65–99)
Potassium: 3.3 mmol/L — ABNORMAL LOW (ref 3.5–5.1)
SODIUM: 141 mmol/L (ref 135–145)
Total Bilirubin: 1.5 mg/dL — ABNORMAL HIGH (ref 0.3–1.2)
Total Protein: 8 g/dL (ref 6.5–8.1)

## 2017-08-09 LAB — RAPID URINE DRUG SCREEN, HOSP PERFORMED
Amphetamines: NOT DETECTED
Barbiturates: NOT DETECTED
Benzodiazepines: NOT DETECTED
COCAINE: POSITIVE — AB
OPIATES: NOT DETECTED
TETRAHYDROCANNABINOL: NOT DETECTED

## 2017-08-09 LAB — BLOOD GAS, ARTERIAL
ACID-BASE EXCESS: 11.7 mmol/L — AB (ref 0.0–2.0)
BICARBONATE: 37.1 mmol/L — AB (ref 20.0–28.0)
DRAWN BY: 275531
O2 CONTENT: 5 L/min
O2 SAT: 93.6 %
Patient temperature: 98.6
pCO2 arterial: 62.6 mmHg — ABNORMAL HIGH (ref 32.0–48.0)
pH, Arterial: 7.39 (ref 7.350–7.450)
pO2, Arterial: 71.6 mmHg — ABNORMAL LOW (ref 83.0–108.0)

## 2017-08-09 LAB — URINALYSIS, ROUTINE W REFLEX MICROSCOPIC
Bilirubin Urine: NEGATIVE
Glucose, UA: NEGATIVE mg/dL
Hgb urine dipstick: NEGATIVE
KETONES UR: NEGATIVE mg/dL
LEUKOCYTES UA: NEGATIVE
Nitrite: NEGATIVE
PROTEIN: 100 mg/dL — AB
Specific Gravity, Urine: 1.011 (ref 1.005–1.030)
pH: 8 (ref 5.0–8.0)

## 2017-08-09 LAB — BRAIN NATRIURETIC PEPTIDE: B Natriuretic Peptide: 466.9 pg/mL — ABNORMAL HIGH (ref 0.0–100.0)

## 2017-08-09 LAB — ETHANOL: Alcohol, Ethyl (B): <10 mg/dL (ref <10)

## 2017-08-09 LAB — MAGNESIUM: MAGNESIUM: 1.6 mg/dL — AB (ref 1.7–2.4)

## 2017-08-09 LAB — PHOSPHORUS: PHOSPHORUS: 2.9 mg/dL (ref 2.5–4.6)

## 2017-08-09 MED ORDER — GABAPENTIN 100 MG PO CAPS
100.0000 mg | ORAL_CAPSULE | Freq: Three times a day (TID) | ORAL | Status: DC
  Administered 2017-08-09 – 2017-08-13 (×9): 100 mg via ORAL
  Filled 2017-08-09 (×9): qty 1

## 2017-08-09 MED ORDER — SODIUM CHLORIDE 0.9% FLUSH
3.0000 mL | Freq: Two times a day (BID) | INTRAVENOUS | Status: DC
  Administered 2017-08-09 – 2017-08-13 (×7): 3 mL via INTRAVENOUS

## 2017-08-09 MED ORDER — ARIPIPRAZOLE 10 MG PO TABS
20.0000 mg | ORAL_TABLET | Freq: Every day | ORAL | Status: DC
  Filled 2017-08-09: qty 2

## 2017-08-09 MED ORDER — PANTOPRAZOLE SODIUM 40 MG PO TBEC
40.0000 mg | DELAYED_RELEASE_TABLET | Freq: Every day | ORAL | Status: DC
  Administered 2017-08-11 – 2017-08-13 (×3): 40 mg via ORAL
  Filled 2017-08-09 (×3): qty 1

## 2017-08-09 MED ORDER — MOMETASONE FURO-FORMOTEROL FUM 200-5 MCG/ACT IN AERO
2.0000 | INHALATION_SPRAY | Freq: Two times a day (BID) | RESPIRATORY_TRACT | Status: DC
  Administered 2017-08-09 – 2017-08-13 (×6): 2 via RESPIRATORY_TRACT
  Filled 2017-08-09: qty 8.8

## 2017-08-09 MED ORDER — SODIUM CHLORIDE 0.9% FLUSH: 3.0000 mL | INTRAVENOUS | Status: DC | PRN

## 2017-08-09 MED ORDER — SODIUM CHLORIDE 0.9 % IV SOLN: 250.0000 mL | INTRAVENOUS | Status: DC | PRN

## 2017-08-09 MED ORDER — FUROSEMIDE 10 MG/ML IJ SOLN
40.0000 mg | Freq: Once | INTRAMUSCULAR | Status: AC
  Administered 2017-08-09: 40 mg via INTRAVENOUS
  Filled 2017-08-09: qty 4

## 2017-08-09 MED ORDER — HEPARIN SODIUM (PORCINE) 5000 UNIT/ML IJ SOLN
5000.0000 [IU] | Freq: Three times a day (TID) | INTRAMUSCULAR | Status: DC
  Administered 2017-08-09 – 2017-08-13 (×11): 5000 [IU] via SUBCUTANEOUS
  Filled 2017-08-09 (×10): qty 1

## 2017-08-09 MED ORDER — AMLODIPINE BESYLATE 5 MG PO TABS
5.0000 mg | ORAL_TABLET | Freq: Every day | ORAL | Status: DC
  Administered 2017-08-11 – 2017-08-13 (×3): 5 mg via ORAL
  Filled 2017-08-09 (×3): qty 1

## 2017-08-09 MED ORDER — ACETAMINOPHEN 325 MG PO TABS
650.0000 mg | ORAL_TABLET | ORAL | Status: DC | PRN
  Administered 2017-08-11: 650 mg via ORAL
  Filled 2017-08-09: qty 2

## 2017-08-09 MED ORDER — ONDANSETRON HCL 4 MG/2ML IJ SOLN: 4.0000 mg | Freq: Four times a day (QID) | INTRAMUSCULAR | Status: DC | PRN

## 2017-08-09 MED ORDER — QUETIAPINE FUMARATE 100 MG PO TABS
300.0000 mg | ORAL_TABLET | Freq: Every day | ORAL | Status: DC
  Administered 2017-08-09: 300 mg via ORAL
  Filled 2017-08-09: qty 3

## 2017-08-09 MED ORDER — IPRATROPIUM-ALBUTEROL 0.5-2.5 (3) MG/3ML IN SOLN: 3.0000 mL | RESPIRATORY_TRACT | Status: DC | PRN

## 2017-08-09 NOTE — Progress Notes (Signed)
_0  ID: Diane Gilbert, female    DOB: [birth year], 52 y.o.   MRN: [id number]  Chief Complaint  Patient presents with  . 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 No frank abscess. . Soft tissue swelling along each lower to mid face without hematoma or abscess.   3. Several lymph nodes inferior to the mandible may have inflammatory etiology. No frank adenopathy by size criteria.. Foci of paranasal sinus disease as described. Note that there is obstruction of the right nasal cavity due to edema and likely underlying polypoid change. Ostiomeatal unit complexes are patent bilaterally, although there is narrowing of the ostiomeatal unit complex on the right due to edema.  02/2017  CT chest mild to moderate emphysema , stable GGO opacities/ILD changes. Hilary R>L lymph nodes.    PFT 2015: FEV1 1.48 (56%), but normal ratio. No BD response. TLC 60%, mild airtrapping. DLCO 45%, but corrects to normal with Av March 2017 pulmonary function testing ratio 85%, FVC 1.61 L (50% predicted) sees, FEV1 1.37 L (53% predicted), total lung capacity 3.14 L (58% predicted), DLCO 13.3 (49% predicted). May 2018 pulmonary function testing ratio 87%, FEV1 1.18 L 50% predicted, FVC 1.36 L 46% predicted, total lung capacity 3.02 L 59% predicted, DLCO 12.65 mL per Min per millimeters of mercury 52% predicted  Path Bronch 03/2014: BAL with 85 WBC's, 21% eos. TBBX with normal lung parenchyma, but not enough for possible specific diagnosis VATS BX 06/2014: RB-ILD (severe), ??? Component NSIP included in the differential  Labs: HP panel negative 03/2014 Autoimmune labs normal 07/2014  6MW March 2017 6 minute walk distance 144  m, O2 saturation nadir 81% on room air April 2018 6 minute walk distance 144 m, O2 saturation nadir 87% on room air  Echo 03/2016 EF ok , Gr 2 DD.   08/09/2017 Follow up : RB-ILD , O2 RF , COPD , OSA/OHS , DCHF , Pulmonary HTN  Pt returns for a post hospital follow up .  Patient was recently admitted last month for decompensated heart failure.  She was treated with diuresis.  She She has diastolic dysfunction, notable severe pulmonary hypertension on echo.  Patient has underlying sleep apnea but does not wear CPAP.  Since discharge patient says she has not been taking her medications on a consistent basis.  She is on oxygen but today on arrival to the office was not wearing this and O2 saturations were in the low 80s on room air. She does have a history of polysubstance abuse with positive cocaine last hospitalization admission.  Patient does say she is used since her discharge.  She does have underlying RB-ILD.  She continues to smoke. Patient is  is drowsy during today's visit.  Says she has no energy and shortness of breath continues to be with minimal activity.  She has significant fluid retention from her thighs to ankles  She will need admission for further evaluation .     No Known Allergies  Immunization History  Administered Date(s) Administered  . Influenza Split 04/03/2016, 04/30/2017  . Influenza,inj,Quad PF,6+ Mos 03/12/2013, 06/07/2014, 05/13/2015, 07/26/2017  . Pneumococcal Polysaccharide-23 07/27/2017    Past Medical History:  Diagnosis Date  . Alcohol abuse   . Anxiety   .  Arthritis   . CHF (congestive heart failure) ([place])   . Depression   . Headache(784.0)   . Hypertension   . Insomnia   . Interstitial lung disease ([place])   . Oxygen dependent   . Schizophrenia ([place])   . Shortness of breath dyspnea   . Sleep apnea   . Sleep apnea     Tobacco History: Social History   Tobacco Use  Smoking Status Current Some Day Smoker  . Packs/day: 1.50  . Years: 30.00    . Pack years: 45.00  . Types: Cigarettes  Smokeless Tobacco Never Used  Tobacco Comment   smoking 3 cigarettes daily "every now and then" 11/04/16   Ready to quit: No Counseling given: No Comment: smoking 3 cigarettes daily "every now and then" 11/04/16   Outpatient Encounter Medications as of 08/09/2017  Medication Sig  . acetaminophen (TYLENOL) 325 MG tablet Take 2 tablets (650 mg total) by mouth every 6 (six) hours as needed for mild pain (or Fever >/= 101).  Marland Kitchen albuterol (PROVENTIL HFA;VENTOLIN HFA) 108 (90 BASE) MCG/ACT inhaler Inhale 2 puffs into the lungs every 6 (six) hours as needed for wheezing or shortness of breath.  Marland Kitchen amLODipine (NORVASC) 5 MG tablet Take 5 mg daily by mouth.  . budesonide-formoterol (SYMBICORT) 160-4.5 MCG/ACT inhaler Inhale 2 puffs into the lungs 2 (two) times daily.  . furosemide (LASIX) 20 MG tablet Take 1 tablet (20 mg total) by mouth daily.  Marland Kitchen oxyCODONE-acetaminophen (PERCOCET/ROXICET) 5-325 MG tablet Take 1 tablet every 6 (six) hours as needed by mouth for pain.  . OXYGEN Inhale 2 L into the lungs continuous.  Marland Kitchen QUEtiapine (SEROQUEL) 300 MG tablet Take 1 tablet (300 mg total) by mouth at bedtime.  . ARIPiprazole (ABILIFY) 20 MG tablet Take 20 mg by mouth daily.  Marland Kitchen buPROPion (WELLBUTRIN SR) 150 MG 12 hr tablet Take 1 tablet (150 mg total) by mouth See admin instructions. Take 1 tablet (150 mg) by mouth every morning and at noon (Patient not taking: Reported on 08/09/2017)  . gabapentin (NEURONTIN) 300 MG capsule Take 300-600 mg See admin instructions by mouth. 38m by mouth three times daily, 6014mat bedtime  . guaiFENesin (MUCINEX) 600 MG 12 hr tablet Take 1 tablet (600 mg total) 2 (two) times daily by mouth. (Patient not taking: Reported on 07/26/2017)  . nicotine (NICODERM CQ - DOSED IN MG/24 HOURS) 14 mg/24hr patch Place 1 patch (14 mg total) daily onto the skin. (Patient not taking: Reported on 06/11/2017)  . traZODone (DESYREL) 100 MG tablet Take 1  tablet (100 mg total) by mouth at bedtime. (Patient not taking: Reported on 08/09/2017)  . [DISCONTINUED] doxycycline (VIBRA-TABS) 100 MG tablet Take 1 tablet (100 mg total) by mouth every 12 (twelve) hours. (Patient not taking: Reported on 08/09/2017)   No facility-administered encounter medications on file as of 08/09/2017.      Review of Systems  Constitutional:   No  weight loss, night sweats,  Fevers, chills, + fatigue, or  lassitude.  HEENT:   No headaches,  Difficulty swallowing,  Tooth/dental problems, or  Sore throat,                No sneezing, itching, ear ache, nasal congestion, post nasal drip,   CV:  No chest pain,  Orthopnea, PND,  anasarca, dizziness, palpitations, syncope.   GI  No heartburn, indigestion, abdominal pain, nausea, vomiting, diarrhea, change in bowel habits, loss of appetite, bloody stools.   Resp:    No chest  wall deformity  Skin: no rash or lesions.  GU: no dysuria, change in color of urine, no urgency or frequency.  No flank pain, no hematuria   MS:  No joint pain or swelling.  No decreased range of motion.  No back pain.    Physical Exam  BP 130/66 (BP Location: Left Arm, Cuff Size: Normal)   Pulse 92   Ht 5' 4" (1.626 m)   SpO2 92%   BMI 42.41 kg/m    GEN: A/Ox3; pleasant , NAD, obese    HEENT:  [place]/AT,  EACs-clear, TMs-wnl, NOSE-clear, THROAT-clear, no lesions, no postnasal drip or exudate noted.   NECK:  Supple w/ fair ROM; no JVD; normal carotid impulses w/o bruits; no thyromegaly or nodules palpated; no lymphadenopathy.    RESP  Clear  P & A; w/o, wheezes/ rales/ or rhonchi. no accessory muscle use, no dullness to percussion  CARD:  RRR, no m/r/g, 2-3 + peripheral edema, pulses intact, no cyanosis or clubbing.  GI:   Soft & nt; nml bowel sounds; no organomegaly or masses detected.   Musco: Warm bil, no deformities or joint swelling noted.   Neuro: alert, no focal deficits noted.    Skin: Warm, no lesions or rashes    Lab  Results:    BNP  Imaging: Dg Chest Portable 1 View  Result Date: 07/24/2017 CLINICAL DATA:  Dyspnea EXAM: PORTABLE CHEST 1 VIEW COMPARISON:  05/09/2017 chest radiograph. FINDINGS: Stable cardiomediastinal silhouette with mild cardiomegaly. No pneumothorax. No pleural effusion. Diffuse hazy and linear parahilar opacities in both lungs. IMPRESSION: Stable cardiomegaly with hazy and linear parahilar opacities throughout both lungs, most compatible with moderate congestive heart failure. Electronically Signed   By: Ilona Sorrel M.D.   On: 07/24/2017 20:05     Assessment & Plan:   Acute on chronic respiratory failure with hypoxia ([place]) Admit to SDU , See HP note      Rexene Edison, NP 08/09/2017

## 2017-08-09 NOTE — Assessment & Plan Note (Signed)
Admit to SDU , See HP note

## 2017-08-09 NOTE — H&P (Signed)
PULMONARY / CRITICAL CARE MEDICINE   Name: Diane Gilbert MRN: [id number] DOB: [birth year]    ADMISSION DATE:  08/09/17   CHIEF COMPLAINT:  SOB   HISTORY OF PRESENT ILLNESS:   52 year old female active smoker followed for RB-ILD, chronic hypoxic and hypercarbic respiratory failure on home oxygen at 2 L obstructive sleep apnea CPAP noncompliant, diastolic heart failure, pulmonary hypertension and cor pulmonale.  Patient has schizoaffective disorder, polysubstance abuse with cocaine and alcohol.  She was recently admitted to the hospital last month for decompensated diastolic heart failure and acute on chronic hypoxic and hypercarbic respiratory failure.  Patient was treated with aggressive diuresis. Patient says since discharge she has noticed that her breathing has been getting worse she is more short of breath and retaining more fluids.  Patient says she has not been taking her medicines on a regular basis.  She does admit to cocaine use.  On arrival to the office today patient did not have on her oxygen O2 saturations were in the low 80s.  Patient says she is been retaining fluid with significant leg swelling.  Patient is somewhat drowsy intermittently.  She denies any hemoptysis fever abdominal pain nausea vomiting or diarrhea.  Previous workup for RB-ILD as below  02/2017 CT chest mild to moderate emphysema , stable GGO opacities/ILD changes. Hilary R>L lymph nodes.    PFT 2015: FEV1 1.48 (56%), but normal ratio. No BD response. TLC 60%, mild airtrapping. DLCO 45%, but corrects to normal with Av March 2017 pulmonary function testing ratio 85%, FVC 1.61 L (50% predicted) sees, FEV1 1.37 L (53% predicted), total lung capacity 3.14 L (58% predicted), DLCO 13.3 (49% predicted). May 2018 pulmonary function testing ratio 87%, FEV1 1.18 L 50% predicted, FVC 1.36 L 46% predicted, total lung capacity 3.02 L 59% predicted, DLCO 12.65 mL per Min per millimeters of mercury 52%  predicted  Path Bronch 03/2014: BAL with 85 WBC's, 21% eos. TBBX with normal lung parenchyma, but not enough for possible specific diagnosis VATS BX 06/2014: RB-ILD (severe), ??? Component NSIP included in the differential  Labs: HP panel negative 03/2014 Autoimmune labs normal 07/2014  6MW March 2017 6 minute walk distance 144 m, O2 saturation nadir 81% on room air April 2018 6 minute walk distance 144 m, O2 saturation nadir 87% on room air  Echo 03/2016 EF ok , Gr 2 DD.  PAST MEDICAL HISTORY :  She  has a past medical history of Alcohol abuse, Anxiety, Arthritis, CHF (congestive heart failure) ([place]), Depression, Headache(784.0), Hypertension, Insomnia, Interstitial lung disease ([place]), Oxygen dependent, Schizophrenia ([place]), Shortness of breath dyspnea, Sleep apnea, and Sleep apnea.  PAST SURGICAL HISTORY: She  has a past surgical history that includes Back surgery; Video bronchoscopy (Bilateral, 04/11/2014); Multiple tooth extractions; Video bronchoscopy (N/A, 07/16/2014); Video assisted thoracoscopy (Left, 07/16/2014); Lung biopsy (Left, 07/16/2014); and Wedge resection (Left, 07/16/2014).  No Known Allergies  No current facility-administered medications on file prior to encounter.    Current Outpatient Medications on File Prior to Encounter  Medication Sig  . acetaminophen (TYLENOL) 325 MG tablet Take 2 tablets (650 mg total) by mouth every 6 (six) hours as needed for mild pain (or Fever >/= 101).  Marland Kitchen albuterol (PROVENTIL HFA;VENTOLIN HFA) 108 (90 BASE) MCG/ACT inhaler Inhale 2 puffs into the lungs every 6 (six) hours as needed for wheezing or shortness of breath.  Marland Kitchen amLODipine (NORVASC) 5 MG tablet Take 5 mg daily by mouth.  . ARIPiprazole (ABILIFY) 20 MG tablet Take 20 mg by mouth  daily.  . budesonide-formoterol (SYMBICORT) 160-4.5 MCG/ACT inhaler Inhale 2 puffs into the lungs 2 (two) times daily.  Marland Kitchen buPROPion (WELLBUTRIN SR) 150 MG 12 hr tablet Take 1 tablet (150 mg total) by  mouth See admin instructions. Take 1 tablet (150 mg) by mouth every morning and at noon (Patient not taking: Reported on 08/09/2017)  . furosemide (LASIX) 20 MG tablet Take 1 tablet (20 mg total) by mouth daily.  Marland Kitchen gabapentin (NEURONTIN) 300 MG capsule Take 300-600 mg See admin instructions by mouth. 357m by mouth three times daily, 6075mat bedtime  . guaiFENesin (MUCINEX) 600 MG 12 hr tablet Take 1 tablet (600 mg total) 2 (two) times daily by mouth. (Patient not taking: Reported on 07/26/2017)  . nicotine (NICODERM CQ - DOSED IN MG/24 HOURS) 14 mg/24hr patch Place 1 patch (14 mg total) daily onto the skin. (Patient not taking: Reported on 06/11/2017)  . oxyCODONE-acetaminophen (PERCOCET/ROXICET) 5-325 MG tablet Take 1 tablet every 6 (six) hours as needed by mouth for pain.  . OXYGEN Inhale 2 L into the lungs continuous.  . Marland KitchenUEtiapine (SEROQUEL) 300 MG tablet Take 1 tablet (300 mg total) by mouth at bedtime.  . traZODone (DESYREL) 100 MG tablet Take 1 tablet (100 mg total) by mouth at bedtime. (Patient not taking: Reported on 08/09/2017)    FAMILY HISTORY:  Her indicated that her mother is alive. She indicated that her father is alive. She indicated that the status of her maternal aunt is unknown.   SOCIAL HISTORY: She  reports that she has been smoking cigarettes.  She has a 45.00 pack-year smoking history. she has never used smokeless tobacco. She reports that she does not drink alcohol or use drugs.  REVIEW OF SYSTEMS:  Constitutional:   No  weight loss, night sweats,  Fevers, chills,  +fatigue, or  lassitude.  HEENT:   No headaches,  Difficulty swallowing,  Tooth/dental problems, or  Sore throat,                No sneezing, itching, ear ache, nasal congestion, post nasal drip,   CV:  No chest pain,  Orthopnea, PND, ++swelling in lower extremities, anasarca, dizziness, palpitations, syncope.   GI  No heartburn, indigestion, abdominal pain, nausea, vomiting, diarrhea, change in bowel  habits, loss of appetite, bloody stools.   Resp:  .  No chest wall deformity  Skin: no rash or lesions.  GU: no dysuria, change in color of urine, no urgency or frequency.  No flank pain, no hematuria   MS:  No joint pain or swelling.  No decreased range of motion.  No back pain.  Psych:  No change in mood or affect. No depression or anxiety.  No memory loss.       SUBJECTIVE:  Sob   VITAL SIGNS: BP 130/66 (BP Location: Left Arm, Cuff Size: Normal)   Pulse 92   Ht _0  (1.626 m)   SpO2 92%   BMI 42.41 kg/m     HEMODYNAMICS:    VENTILATOR SETTINGS:    INTAKE / OUTPUT: No intake/output data recorded.  PHYSICAL EXAMINATION: GEN: A/Ox3; obese , in wc    HEENT:  [place]/AT,  EACs-clear, TMs-wnl, NOSE-clear, THROAT-clear, no lesions, no postnasal drip or exudate noted.   NECK:  Supple w/ fair ROM; no JVD; normal carotid impulses w/o bruits; no thyromegaly or nodules palpated; no lymphadenopathy.    RESP  diminshed BS in bases , . no accessory muscle use, no dullness to percussion  CARD:  RRR, no m/r/g  , 2-3 + peripheral edema, pulses intact, no cyanosis or clubbing.  GI:   Soft & nt; nml bowel sounds; no organomegaly or masses detected.   Musco: Warm bil, no deformities or joint swelling noted.   Neuro: alert, no focal deficits noted.    Skin: Warm, no lesions or rashes   LABS:  BMET No results for input(s): NA, K, CL, CO2, BUN, CREATININE, GLUCOSE in the last 168 hours.  Electrolytes No results for input(s): CALCIUM, MG, PHOS in the last 168 hours.  CBC No results for input(s): WBC, HGB, HCT, PLT in the last 168 hours.  Coag's No results for input(s): APTT, INR in the last 168 hours.  Sepsis Markers No results for input(s): LATICACIDVEN, PROCALCITON, O2SATVEN in the last 168 hours.  ABG No results for input(s): PHART, PCO2ART, PO2ART in the last 168 hours.  Liver Enzymes No results for input(s): AST, ALT, ALKPHOS, BILITOT, ALBUMIN in the last  168 hours.  Cardiac Enzymes No results for input(s): TROPONINI, PROBNP in the last 168 hours.  Glucose No results for input(s): GLUCAP in the last 168 hours.  Imaging No results found.   STUDIES:  2D echo July 09, 2017 EF 60-65%, Right atrium mildly dilated, pulmonary artery pressure severely increased at 74 mmHg, right ventricle moderately dilated..   CULTURES:   ANTIBIOTICS:   SIGNIFICANT EVENTS: 2/11 Admit from office   LINES/TUBES:   DISCUSSION: 52 year old female, active smoker, followed for RB -ILD, hypoxic and hypercarbic respiratory failure on home oxygen at 2 L, diastolic heart failure, obstructive sleep apnea/CPAP noncompliant, pulmonary hypertension and cor pulmonale complicated by polysubstance abuse with cocaine and alcohol admitted for decompensated heart failure and acute on chronic hypercarbic and hypoxic respiratory failure.   ASSESSMENT / PLAN:  PULMONARY A: RB-ILD smoker , Restrictive lung disease  Pulmonary HTN  Acute Hypercarbic /Hypoxic Respiratory Failure  P:   Check cxr  Check ABG  BIPAP As needed   Duoneb neb As needed   Smoking cessation    CARDIOVASCULAR A:  Diastolic Heart Failure /Cor pulmonale   P:  Diuresis as b/p and scr will allow   RENAL A:   No acute issues  P:   Trend and replace electrolytes as indicated Labs pending   GASTROINTESTINAL A:   Nutrition  P:   PPI  Heart healthy diet   HEMATOLOGIC A:   No acute process noted  P:  Hep SQ for DVT prophylaxis   INFECTIOUS A:   None apparent  P:   Trend temp /wbc curve   ENDOCRINE A:   No acute process noted ,  P:   Trend BS on chem , add SSI if needed.   NEUROLOGIC A:   Schizoaffective disorder  Polysubstance abuse  P:   Avoid sedating rx  Check tox screen  Check etoh  Cont home depression meds     FAMILY  - Updates: boyfriend updated in clinic   - Inter-disciplinary family meet or Palliative Care meeting due by: 08/16/17     Rexene Edison NP-C  Pulmonary and [place] Pager: [phone]  08/09/2017, 3:49 PM

## 2017-08-09 NOTE — Progress Notes (Signed)
Patient declined NIV at this time, no distress noted, RCP will continue to monitor.

## 2017-08-09 NOTE — Patient Instructions (Signed)
Admit

## 2017-08-10 ENCOUNTER — Encounter (HOSPITAL_COMMUNITY): Payer: Self-pay | Admitting: *Deleted

## 2017-08-10 ENCOUNTER — Other Ambulatory Visit: Payer: Self-pay

## 2017-08-10 DIAGNOSIS — J810 Acute pulmonary edema: Secondary | ICD-10-CM

## 2017-08-10 DIAGNOSIS — F1910 Other psychoactive substance abuse, uncomplicated: Secondary | ICD-10-CM

## 2017-08-10 DIAGNOSIS — J9601 Acute respiratory failure with hypoxia: Secondary | ICD-10-CM

## 2017-08-10 DIAGNOSIS — J849 Interstitial pulmonary disease, unspecified: Secondary | ICD-10-CM

## 2017-08-10 LAB — BASIC METABOLIC PANEL
ANION GAP: 11 (ref 5–15)
Anion gap: 11 (ref 5–15)
CHLORIDE: 98 mmol/L — AB (ref 101–111)
CO2: 33 mmol/L — AB (ref 22–32)
CO2: 33 mmol/L — AB (ref 22–32)
CREATININE: 0.65 mg/dL (ref 0.44–1.00)
Calcium: 8.3 mg/dL — ABNORMAL LOW (ref 8.9–10.3)
Calcium: 8.3 mg/dL — ABNORMAL LOW (ref 8.9–10.3)
Chloride: 95 mmol/L — ABNORMAL LOW (ref 101–111)
Creatinine, Ser: 0.77 mg/dL (ref 0.44–1.00)
GFR calc Af Amer: >60 mL/min (ref >60)
GFR calc Af Amer: >60 mL/min (ref >60)
GFR calc non Af Amer: >60 mL/min (ref >60)
GFR calc non Af Amer: >60 mL/min (ref >60)
GLUCOSE: 180 mg/dL — AB (ref 65–99)
Glucose, Bld: 86 mg/dL (ref 65–99)
POTASSIUM: 3.2 mmol/L — AB (ref 3.5–5.1)
POTASSIUM: 4 mmol/L (ref 3.5–5.1)
Sodium: 142 mmol/L (ref 135–145)
Sodium: 139 mmol/L (ref 135–145)

## 2017-08-10 LAB — BLOOD GAS, ARTERIAL
Acid-Base Excess: 14.1 mmol/L — ABNORMAL HIGH (ref 0.0–2.0)
Bicarbonate: 39.9 mmol/L — ABNORMAL HIGH (ref 20.0–28.0)
Expiratory PAP: 8
INSPIRATORY PAP: 18
O2 Content: 10 L/min
O2 SAT: 90.5 %
PATIENT TEMPERATURE: 98.6
PCO2 ART: 70 mmHg — AB (ref 32.0–48.0)
PH ART: 7.374 (ref 7.350–7.450)
PO2 ART: 65.6 mmHg — AB (ref 83.0–108.0)

## 2017-08-10 LAB — GLUCOSE, CAPILLARY
GLUCOSE-CAPILLARY: 111 mg/dL — AB (ref 65–99)
GLUCOSE-CAPILLARY: 63 mg/dL — AB (ref 65–99)
Glucose-Capillary: 100 mg/dL — ABNORMAL HIGH (ref 65–99)
Glucose-Capillary: 195 mg/dL — ABNORMAL HIGH (ref 65–99)

## 2017-08-10 LAB — CBC
HEMATOCRIT: 48.9 % — AB (ref 36.0–46.0)
HEMOGLOBIN: 13.2 g/dL (ref 12.0–15.0)
MCH: 20.1 pg — AB (ref 26.0–34.0)
MCHC: 27 g/dL — AB (ref 30.0–36.0)
MCV: 74.3 fL — ABNORMAL LOW (ref 78.0–100.0)
Platelets: 252 10*3/uL (ref 150–400)
RBC: 6.58 MIL/uL — AB (ref 3.87–5.11)
RDW: 23.8 % — ABNORMAL HIGH (ref 11.5–15.5)
WBC: 8.2 10*3/uL (ref 4.0–10.5)

## 2017-08-10 LAB — RAPID URINE DRUG SCREEN, HOSP PERFORMED
AMPHETAMINES: NOT DETECTED
BARBITURATES: NOT DETECTED
Benzodiazepines: NOT DETECTED
COCAINE: NOT DETECTED
OPIATES: NOT DETECTED
TETRAHYDROCANNABINOL: NOT DETECTED

## 2017-08-10 MED ORDER — FUROSEMIDE 10 MG/ML IJ SOLN: 40.0000 mg | Freq: Three times a day (TID) | INTRAMUSCULAR | Status: DC

## 2017-08-10 MED ORDER — FUROSEMIDE 10 MG/ML IJ SOLN
60.0000 mg | Freq: Two times a day (BID) | INTRAMUSCULAR | Status: AC
  Administered 2017-08-10 (×2): 60 mg via INTRAVENOUS
  Filled 2017-08-10 (×2): qty 6

## 2017-08-10 MED ORDER — NALOXONE HCL 0.4 MG/ML IJ SOLN
INTRAMUSCULAR | Status: AC
  Administered 2017-08-10: 0.4 mg
  Filled 2017-08-10: qty 1

## 2017-08-10 MED ORDER — MAGNESIUM SULFATE 2 GM/50ML IV SOLN
2.0000 g | Freq: Once | INTRAVENOUS | Status: AC
  Administered 2017-08-10: 2 g via INTRAVENOUS
  Filled 2017-08-10: qty 50

## 2017-08-10 MED ORDER — ARIPIPRAZOLE 5 MG PO TABS
15.0000 mg | ORAL_TABLET | Freq: Every day | ORAL | Status: DC
  Administered 2017-08-11 – 2017-08-13 (×3): 15 mg via ORAL
  Filled 2017-08-10 (×3): qty 1

## 2017-08-10 MED ORDER — POTASSIUM CHLORIDE 10 MEQ/100ML IV SOLN
10.0000 meq | INTRAVENOUS | Status: AC
  Administered 2017-08-10 (×3): 10 meq via INTRAVENOUS
  Filled 2017-08-10 (×4): qty 100

## 2017-08-10 MED ORDER — QUETIAPINE FUMARATE 100 MG PO TABS
200.0000 mg | ORAL_TABLET | Freq: Every day | ORAL | Status: DC
  Administered 2017-08-10: 200 mg via ORAL
  Filled 2017-08-10: qty 2

## 2017-08-10 MED ORDER — POTASSIUM CHLORIDE CRYS ER 20 MEQ PO TBCR
40.0000 meq | EXTENDED_RELEASE_TABLET | Freq: Three times a day (TID) | ORAL | Status: AC
  Administered 2017-08-10: 40 meq via ORAL
  Filled 2017-08-10: qty 2

## 2017-08-10 MED ORDER — METHYLPREDNISOLONE SODIUM SUCC 125 MG IJ SOLR
60.0000 mg | Freq: Three times a day (TID) | INTRAMUSCULAR | Status: DC
  Administered 2017-08-10 – 2017-08-11 (×3): 60 mg via INTRAVENOUS
  Filled 2017-08-10 (×3): qty 2

## 2017-08-10 NOTE — Progress Notes (Signed)
Patient sats were not improving with a dream station bipap.  Switched patient to a V60 bipap to set FIO2 of 100%.  Patient tolerating well.  ABG was obtained prior to placing on V60.  Will continue to monitor.
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
## 2017-08-10 NOTE — Progress Notes (Signed)
RT called to patient room due to MD ordering bipap for patient.  Upon arrival patient was on 12L high flow nasal cannula with sats of 93%.  Patient would respond however it was minimal.  Took patient off of high flow and placed patient on bipap of 18/8 with 10L of oxygen bled through.  Sats still only maintaining at 90%.  Patient began to fight and try to refuse bipap when first placing it on patient however due to patient being lethargic was able to still place bipap on patient.  Currently patient is tolerating well.  RN is aware and paging MD for further instructions.
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
## 2017-08-10 NOTE — Progress Notes (Signed)
Pt awake asking for mask to be removed. Bipap removed and pt place on Hi-flow [place] at 10. sats maintaining. Water given to pt, pt told to hold off on food. Call bell and phone within reach. Will continue to monitor.

## 2017-08-11 ENCOUNTER — Inpatient Hospital Stay (HOSPITAL_COMMUNITY): Payer: Medicaid Other

## 2017-08-11 DIAGNOSIS — J9621 Acute and chronic respiratory failure with hypoxia: Secondary | ICD-10-CM

## 2017-08-11 LAB — BASIC METABOLIC PANEL
ANION GAP: 9 (ref 5–15)
BUN: 5 mg/dL — ABNORMAL LOW (ref 6–20)
CALCIUM: 8.4 mg/dL — AB (ref 8.9–10.3)
CO2: 33 mmol/L — ABNORMAL HIGH (ref 22–32)
Chloride: 95 mmol/L — ABNORMAL LOW (ref 101–111)
Creatinine, Ser: 0.67 mg/dL (ref 0.44–1.00)
Glucose, Bld: 144 mg/dL — ABNORMAL HIGH (ref 65–99)
POTASSIUM: 4.4 mmol/L (ref 3.5–5.1)
Sodium: 137 mmol/L (ref 135–145)

## 2017-08-11 LAB — CBC
HCT: 50.9 % — ABNORMAL HIGH (ref 36.0–46.0)
Hemoglobin: 13.8 g/dL (ref 12.0–15.0)
MCH: 20.1 pg — ABNORMAL LOW (ref 26.0–34.0)
MCHC: 27.1 g/dL — ABNORMAL LOW (ref 30.0–36.0)
MCV: 74.2 fL — ABNORMAL LOW (ref 78.0–100.0)
Platelets: 279 10*3/uL (ref 150–400)
RBC: 6.86 MIL/uL — AB (ref 3.87–5.11)
RDW: 23.9 % — AB (ref 11.5–15.5)
WBC: 6.7 10*3/uL (ref 4.0–10.5)

## 2017-08-11 LAB — GLUCOSE, CAPILLARY
GLUCOSE-CAPILLARY: 139 mg/dL — AB (ref 65–99)
Glucose-Capillary: 187 mg/dL — ABNORMAL HIGH (ref 65–99)
Glucose-Capillary: 177 mg/dL — ABNORMAL HIGH (ref 65–99)
Glucose-Capillary: 158 mg/dL — ABNORMAL HIGH (ref 65–99)

## 2017-08-11 LAB — PHOSPHORUS: Phosphorus: 3.6 mg/dL (ref 2.5–4.6)

## 2017-08-11 LAB — ECHOCARDIOGRAM COMPLETE
HEIGHTINCHES: 64 in
Weight: 3947.12 oz

## 2017-08-11 LAB — MAGNESIUM: MAGNESIUM: 1.7 mg/dL (ref 1.7–2.4)

## 2017-08-11 MED ORDER — METHYLPREDNISOLONE SODIUM SUCC 40 MG IJ SOLR
40.0000 mg | Freq: Two times a day (BID) | INTRAMUSCULAR | Status: DC
  Administered 2017-08-11 – 2017-08-12 (×2): 40 mg via INTRAVENOUS
  Filled 2017-08-11 (×2): qty 1

## 2017-08-11 MED ORDER — FUROSEMIDE 10 MG/ML IJ SOLN
60.0000 mg | Freq: Two times a day (BID) | INTRAMUSCULAR | Status: AC
  Administered 2017-08-11 (×2): 60 mg via INTRAVENOUS
  Filled 2017-08-11 (×2): qty 6

## 2017-08-11 MED ORDER — CHLORHEXIDINE GLUCONATE 0.12 % MT SOLN
15.0000 mL | Freq: Two times a day (BID) | OROMUCOSAL | Status: DC
  Administered 2017-08-11 – 2017-08-13 (×4): 15 mL via OROMUCOSAL
  Filled 2017-08-11 (×4): qty 15

## 2017-08-11 MED ORDER — QUETIAPINE FUMARATE 100 MG PO TABS
100.0000 mg | ORAL_TABLET | Freq: Every day | ORAL | Status: DC
  Administered 2017-08-11 – 2017-08-12 (×2): 100 mg via ORAL
  Filled 2017-08-11 (×2): qty 1

## 2017-08-11 MED ORDER — ORAL CARE MOUTH RINSE
15.0000 mL | Freq: Two times a day (BID) | OROMUCOSAL | Status: DC
  Administered 2017-08-12: 15 mL via OROMUCOSAL

## 2017-08-11 NOTE — Progress Notes (Signed)
Reviewed, agree

## 2017-08-11 NOTE — Progress Notes (Signed)
PULMONARY / CRITICAL CARE MEDICINE   Name: Diane Gilbert MRN: [id number] DOB: [birth year]    ADMISSION DATE:  08/09/17   CHIEF COMPLAINT:  SOB   HISTORY OF PRESENT ILLNESS:   52 year old female active smoker followed for RB-ILD, chronic hypoxic and hypercarbic respiratory failure on home oxygen at 2 L obstructive sleep apnea CPAP noncompliant, diastolic heart failure, pulmonary hypertension and cor pulmonale.  Patient has schizoaffective disorder, polysubstance abuse with cocaine and alcohol.  She was recently admitted to the hospital last month for decompensated diastolic heart failure and acute on chronic hypoxic and hypercarbic respiratory failure.  Patient was treated with aggressive diuresis. Patient says since discharge she has noticed that her breathing has been getting worse she is more short of breath and retaining more fluids.  Patient says she has not been taking her medicines on a regular basis.  She does admit to cocaine use.  On arrival to the office today patient did not have on her oxygen O2 saturations were in the low 80s.  Patient says she is been retaining fluid with significant leg swelling.  Patient is somewhat drowsy intermittently.  She denies any hemoptysis fever abdominal pain nausea vomiting or diarrhea.  Previous workup for RB-ILD as below  02/2017 CT chest mild to moderate emphysema , stable GGO opacities/ILD changes. Hilary R>L lymph nodes.    PFT 2015: FEV1 1.48 (56%), but normal ratio. No BD response. TLC 60%, mild airtrapping. DLCO 45%, but corrects to normal with Av March 2017 pulmonary function testing ratio 85%, FVC 1.61 L (50% predicted) sees, FEV1 1.37 L (53% predicted), total lung capacity 3.14 L (58% predicted), DLCO 13.3 (49% predicted). May 2018 pulmonary function testing ratio 87%, FEV1 1.18 L 50% predicted, FVC 1.36 L 46% predicted, total lung capacity 3.02 L 59% predicted, DLCO 12.65 mL per Min per millimeters of mercury 52%  predicted  Path Bronch 03/2014: BAL with 85 WBC's, 21% eos. TBBX with normal lung parenchyma, but not enough for possible specific diagnosis VATS BX 06/2014: RB-ILD (severe), ??? Component NSIP included in the differential  Labs: HP panel negative 03/2014 Autoimmune labs normal 07/2014    SUBJECTIVE:   Lethargic yesterday, hypercarbia but compensated on ABG from yesterday. Wore bipap overnight.  Remains fairly drowsy but wakes easily.  ??if boyfriend brought narcs yesterday.    VITAL SIGNS: Vitals:   08/11/17 0005 08/11/17 0420 08/11/17 0635 08/11/17 0744  BP: 127/73 121/73  123/82  Pulse: (!) 103 83 94 80  Resp: 17 16 (!) 21 19  Temp:  98.1 F (36.7 C)  98.5 F (36.9 C)  TempSrc:  Axillary  Oral  SpO2: 97% 96% 92% 90%  Weight:  111.9 kg (246 lb 11.1 oz)    Height:          HEMODYNAMICS:    VENTILATOR SETTINGS: FiO2 (%):  [40 %-100 %] 50 %  INTAKE / OUTPUT: I/O last 3 completed shifts: In: 480 [P.O.:480] Out: 3025 [Urine:3025]  PHYSICAL EXAMINATION:  General:  Obese female, drowsy, NAD, on [place] 4L  HEENT: MM pink/moist Neuro:  Drowsy, arousable, follows commands, oriented but dull affect  CV: s1s2 rrr, no m/r/g PULM: even/non-labored, lungs bilaterally diminished throughout  GI: obese, soft, non-tender, bsx4 active  Extremities: warm/dry, 2+ BLE edema  Skin: no rashes or lesions   LABS:  BMET Recent Labs  Lab 08/10/17 0238 08/10/17 1830 08/11/17 0258  NA 142 139 137  K 3.2* 4.0 4.4  CL 98* 95* 95*  CO2 33* 33* 33*  BUN <5* <5* 5*  CREATININE 0.65 0.77 0.67  GLUCOSE 86 180* 144*    Electrolytes Recent Labs  Lab 08/09/17 1713 08/10/17 0238 08/10/17 1830 08/11/17 0258  CALCIUM 8.4* 8.3* 8.3* 8.4*  MG 1.6*  --   --  1.7  PHOS 2.9  --   --  3.6    CBC Recent Labs  Lab 08/09/17 1713 08/10/17 0238 08/11/17 0258  WBC 9.7 8.2 6.7  HGB 14.2 13.2 13.8  HCT 51.5* 48.9* 50.9*  PLT 214 252 279    Coag's No results for input(s):  APTT, INR in the last 168 hours.  Sepsis Markers No results for input(s): LATICACIDVEN, PROCALCITON, O2SATVEN in the last 168 hours.  ABG Recent Labs  Lab 08/09/17 1647 08/10/17 0940  PHART 7.390 7.374  PCO2ART 62.6* 70.0*  PO2ART 71.6* 65.6*    Liver Enzymes Recent Labs  Lab 08/09/17 1713  AST 26  ALT 19  ALKPHOS 80  BILITOT 1.5*  ALBUMIN 2.4*    Cardiac Enzymes No results for input(s): TROPONINI, PROBNP in the last 168 hours.  Glucose Recent Labs  Lab 08/10/17 1017 08/10/17 1127 08/10/17 1637 08/10/17 2125 08/11/17 0554  GLUCAP 100* 111* 63* 195* 139*    Imaging Dg Chest Port 1 View  Result Date: 08/11/2017 CLINICAL DATA:  Acute respiratory failure with hypoxemia. Pathologically diagnosed RB ILD. EXAM: PORTABLE CHEST 1 VIEW COMPARISON:  08/09/2017, 05/09/2017, 03/23/2017, 07/04/2016 and CT chest 03/23/2017. FINDINGS: Trachea is midline. Heart is enlarged. Pulmonary arteries appear prominent. Mild diffuse ground-glass appearance in the lungs, similar to prior exams. No definite pleural fluid. IMPRESSION: Mild chronic ground-glass appearance in the lungs bilaterally, in keeping with the known diagnosis of respiratory bronchiolitis interstitial lung disease. No definite superimposed acute findings. Electronically Signed   By: Lorin Picket M.D.   On: 08/11/2017 08:32     STUDIES:  2D echo 07/25/17>>>  EF 60-65%, Right atrium mildly dilated, pulmonary artery pressure severely increased at 74 mmHg, right ventricle moderately dilated..   CULTURES:   ANTIBIOTICS:   SIGNIFICANT EVENTS: 2/11 Admit from office   LINES/TUBES:   DISCUSSION: 52 year old female, active smoker, followed for RB -ILD, hypoxic and hypercarbic respiratory failure on home oxygen at 2 L, diastolic heart failure, obstructive sleep apnea/CPAP noncompliant, pulmonary hypertension and cor pulmonale complicated by medical noncompliance and polysubstance abuse with cocaine and alcohol admitted  for decompensated heart failure and acute on chronic hypercarbic and hypoxic respiratory failure.   ASSESSMENT / PLAN:  PULMONARY A: Acute on chronic respiratory failure - multifactorial in setting RB-ILD (active smoker), restrictive lung disease, decompensated OSA as well as decompensated heart failure.   RB-ILD smoker , Restrictive lung disease  Pulmonary HTN  OSA/OHS - noncompliant with CPAP P:   Continue Bipap PRN and qhs   Stressed qhs cpap compliance  Avoid oversedation  Diuresis as BP and Scr tolerate as below  PRN ABG for AMS - chronic hypercarbia  F/u CXR  Smoking cessation!!! Continue dulera  PRN duoneb  Wean solumedrol    CARDIOVASCULAR A:  Diastolic Heart Failure /Cor pulmonale  BNP 466.9 Stable cardiomegaly per CXR 2/11 ? edema Abnormal EKG on admission  P:  Monitor QTC Diuresis as b/p and scr will allow - will repeat lasix 72m IV BID x2 today  Trend BNP, I/O Tele monitoring Echo as above  Stress medication compliance   RENAL A:   No acute issues Hypomagnesia Hypokalemia P:   Trend and replace electrolytes as indicated Trend BMET daily and prn  GASTROINTESTINAL A:   Nutrition  Obese P:   PPI  Heart healthy diet >> NPO while on BiPAP  HEMATOLOGIC A:   No acute process noted  P:  Hep SQ for DVT prophylaxis  Trend CBC and platelets Monitor for bleeding  INFECTIOUS A:   Afebrile WBC WNL T Max 99.3 P:   Trend temp /wbc curve Monitor off ABX   ENDOCRINE A:   No acute issue  P:   CBG ACHS, add SSI if needed.   NEUROLOGIC A:   Schizoaffective disorder  Polysubstance abuse >> UDS + for cocaine 2/11 ETOH < 10 Sudden change LOC after boyfriend visited 2/12/ early am Suspect street drugs being supplied by boyfriend P:   Avoid sedating rx  Decrease qhs seroquel to 132m qhs -- ??compliance at home.  Consider repeat drug screen -- ?getting narcotics from outside  Re-check urine drug screen Cont Abilify     FAMILY  -  Updates: No family at bedside 2/13  - Inter-disciplinary family meet or Palliative Care meeting due by: 08/16/17     Will ask [facility] to assume care 2/14.  Will f/u as outpt for pulmonary needs.   KNickolas Madrid NP 08/11/2017  9:25 AM Pager: [phone]

## 2017-08-11 NOTE — Progress Notes (Signed)
Pt is currently on NIV at this time.

## 2017-08-12 LAB — BASIC METABOLIC PANEL
Anion gap: 13 (ref 5–15)
BUN: 8 mg/dL (ref 6–20)
CALCIUM: 8.6 mg/dL — AB (ref 8.9–10.3)
CO2: 34 mmol/L — AB (ref 22–32)
CREATININE: 0.65 mg/dL (ref 0.44–1.00)
Chloride: 92 mmol/L — ABNORMAL LOW (ref 101–111)
GFR calc non Af Amer: >60 mL/min (ref >60)
Glucose, Bld: 166 mg/dL — ABNORMAL HIGH (ref 65–99)
Potassium: 3.2 mmol/L — ABNORMAL LOW (ref 3.5–5.1)
Sodium: 139 mmol/L (ref 135–145)

## 2017-08-12 LAB — GLUCOSE, CAPILLARY
GLUCOSE-CAPILLARY: 146 mg/dL — AB (ref 65–99)
GLUCOSE-CAPILLARY: 178 mg/dL — AB (ref 65–99)
Glucose-Capillary: 104 mg/dL — ABNORMAL HIGH (ref 65–99)
Glucose-Capillary: 199 mg/dL — ABNORMAL HIGH (ref 65–99)

## 2017-08-12 LAB — BRAIN NATRIURETIC PEPTIDE: B NATRIURETIC PEPTIDE 5: 405.5 pg/mL — AB (ref 0.0–100.0)

## 2017-08-12 MED ORDER — PREDNISONE 20 MG PO TABS
40.0000 mg | ORAL_TABLET | Freq: Every day | ORAL | Status: DC
  Administered 2017-08-13: 40 mg via ORAL
  Filled 2017-08-12: qty 2

## 2017-08-12 MED ORDER — FUROSEMIDE 10 MG/ML IJ SOLN
40.0000 mg | Freq: Every day | INTRAMUSCULAR | Status: DC
  Administered 2017-08-12 – 2017-08-13 (×2): 40 mg via INTRAVENOUS
  Filled 2017-08-12 (×2): qty 4

## 2017-08-12 MED ORDER — POTASSIUM CHLORIDE CRYS ER 20 MEQ PO TBCR
40.0000 meq | EXTENDED_RELEASE_TABLET | ORAL | Status: AC
  Administered 2017-08-12 (×2): 40 meq via ORAL
  Filled 2017-08-12 (×2): qty 2

## 2017-08-12 NOTE — Progress Notes (Signed)
[facility] Progress Note  Patient: Diane Gilbert GLO:[id number]   PCP: Nolene Ebbs, MD DOB: September 03, 1965   DOA: 08/09/2017   DOS: 08/12/2017   Date of Service: the patient was seen and examined on 08/12/2017  Subjective: Feeling better, still has swelling in the leg.  Still has shortness of breath.  No nausea no vomiting.  Brief hospital course: 52 year old female active smoker followed for RB-ILD, chronic hypoxic and hypercarbic respiratory failure on home oxygen at 2 L obstructive sleep apnea CPAP noncompliant, diastolic heart failure, pulmonary hypertension and cor pulmonale.  Patient has schizoaffective disorder, polysubstance abuse with cocaine and alcohol.  She was recently admitted to the hospital last month for decompensated diastolic heart failure and acute on chronic hypoxic and hypercarbic respiratory failure.  Patient was treated with aggressive diuresis. Patient says since discharge she has noticed that her breathing has been getting worse she is more short of breath and retaining more fluids.  Patient says she has not been taking her medicines on a regular basis.  She does admit to cocaine use.  On arrival to the office today patient did not have on her oxygen O2 saturations were in the low 80s.  Patient says she is been retaining fluid with significant leg swelling.  Patient is somewhat drowsy intermittently.  She denies any hemoptysis fever abdominal pain nausea vomiting or diarrhea.   Currently further plan is continue diuresis..  Assessment and Plan: PULMONARY A: Acute on chronic respiratory failure - multifactorial in setting RB-ILD (active smoker), restrictive lung disease, decompensated OSA as well as decompensated heart failure.   RB-ILD smoker , Restrictive lung disease  Pulmonary HTN  OSA/OHS - noncompliant with CPAP P:   Continue Bipap PRN and qhs   Stressed qhs cpap compliance  Avoid oversedation  Diuresis as BP and Scr tolerate as below  Smoking  cessation!!! Continue dulera  PRN duoneb  Wean solumedrol  switch to oral prednisone.   CARDIOVASCULAR A:  Diastolic Heart Failure /Cor pulmonale  BNP 466.9 Stable cardiomegaly per CXR 2/11 ? edema Abnormal EKG on admission  P:  Monitor QTC Diuresis as b/p and scr will allow -and Lasix daily.  RENAL A:   No acute issues Hypomagnesia Hypokalemia P:   Trend and replace electrolytes as indicated Trend BMET daily and prn   GASTROINTESTINAL A:   Nutrition  Obese P:   PPI  Heart healthy diet >> NPO while on BiPAP  HEMATOLOGIC A:   No acute process noted  P:  Hep SQ for DVT prophylaxis  Trend CBC and platelets Monitor for bleeding  INFECTIOUS A:   Afebrile WBC WNL T Max 99.3 P:   Trend temp /wbc curve Monitor off ABX   ENDOCRINE A:   No acute issue  P:   CBG ACHS, add SSI if needed.   NEUROLOGIC A:   Schizoaffective disorder  Polysubstance abuse >> UDS + for cocaine 2/11 ETOH < 10 Sudden change LOC after boyfriend visited 2/12/ early am Suspect street drugs being supplied by boyfriend P:   Avoid sedating rx  Decrease qhs seroquel to 116m qhs -- ??compliance at home.  Consider repeat drug screen -- ?getting narcotics from outside  Re-check urine drug screen Cont Abilify   Diet: Cardiac diet DVT Prophylaxis: subcutaneous Heparin  Advance goals of care discussion: full code  Family Communication: no family was present at bedside, at the time of interview.   Disposition:  Discharge to home.  Consultants: PCCM prmary admision  Procedures: Echocardiogram   Antibiotics:  Anti-infectives (From admission, onward)   None       Objective: Physical Exam: Vitals:   08/12/17 0754 08/12/17 0945 08/12/17 1134 08/12/17 1428  BP: (!) 142/92  126/90 (!) 147/88  Pulse: 90  88 93  Resp: _0 Temp: 98.6 F (37 C)  97.9 F (36.6 C) 98.6 F (37 C)  TempSrc: Oral  Oral Oral  SpO2: 92% 92% 95% 96%  Weight:      Height:         Intake/Output Summary (Last 24 hours) at 08/12/2017 1818 Last data filed at 08/12/2017 1300 Gross per 24 hour  Intake 480 ml  Output -  Net 480 ml   Filed Weights   08/10/17 0413 08/11/17 0420 08/12/17 0445  Weight: 109 kg (240 lb 3.2 oz) 111.9 kg (246 lb 11.1 oz) 106 kg (233 lb 11.2 oz)   General: Alert, Awake and Oriented to Time, Place and Person. Appear in mild distress, affect appropriate Eyes: PERRL, Conjunctiva normal ENT: Oral Mucosa clear moist. Neck: no JVD, no Abnormal Mass Or lumps Cardiovascular: S1 and S2 Present, no Murmur, Peripheral Pulses Present Respiratory: normal respiratory effort, Bilateral Air entry equal and Decreased, no use of accessory muscle, Clear to Auscultation, no Crackles, no wheezes Abdomen: Bowel Sound present, Soft and no tenderness, no hernia Skin: no redness, no Rash, no induration Extremities: bilateral  Pedal edema, no calf tenderness Neurologic: Grossly no focal neuro deficit. Bilaterally Equal motor strength  Data Reviewed: CBC: Recent Labs  Lab 08/09/17 1713 08/10/17 0238 08/11/17 0258  WBC 9.7 8.2 6.7  NEUTROABS 5.2  --   --   HGB 14.2 13.2 13.8  HCT 51.5* 48.9* 50.9*  MCV 73.8* 74.3* 74.2*  PLT 214 252 945   Basic Metabolic Panel: Recent Labs  Lab 08/09/17 1713 08/10/17 0238 08/10/17 1830 08/11/17 0258 08/12/17 0339  NA 141 142 139 137 139  K 3.3* 3.2* 4.0 4.4 3.2*  CL 97* 98* 95* 95* 92*  CO2 32 33* 33* 33* 34*  GLUCOSE 111* 86 180* 144* 166*  BUN <5* <5* <5* 5* 8  CREATININE 0.64 0.65 0.77 0.67 0.65  CALCIUM 8.4* 8.3* 8.3* 8.4* 8.6*  MG 1.6*  --   --  1.7  --   PHOS 2.9  --   --  3.6  --     Liver Function Tests: Recent Labs  Lab 08/09/17 1713  AST 26  ALT 19  ALKPHOS 80  BILITOT 1.5*  PROT 8.0  ALBUMIN 2.4*   No results for input(s): LIPASE, AMYLASE in the last 168 hours. No results for input(s): AMMONIA in the last 168 hours. Coagulation Profile: No results for input(s): INR, PROTIME in the  last 168 hours. Cardiac Enzymes: No results for input(s): CKTOTAL, CKMB, CKMBINDEX, TROPONINI in the last 168 hours. BNP (last 3 results) No results for input(s): PROBNP in the last 8760 hours. CBG: Recent Labs  Lab 08/11/17 1631 08/11/17 2140 08/12/17 0625 08/12/17 1143 08/12/17 1646  GLUCAP 177* 158* 104* 199* 146*   Studies: No results found.  Scheduled Meds: . amLODipine  5 mg Oral Daily  . ARIPiprazole  15 mg Oral Daily  . chlorhexidine  15 mL Mouth Rinse BID  . furosemide  40 mg Intravenous Daily  . gabapentin  100 mg Oral TID  . heparin  5,000 Units Subcutaneous Q8H  . mouth rinse  15 mL Mouth Rinse q12n4p  . mometasone-formoterol  2 puff Inhalation BID  . pantoprazole  40  mg Oral Daily  . [START ON 08/13/2017] predniSONE  40 mg Oral Q breakfast  . QUEtiapine  100 mg Oral QHS  . sodium chloride flush  3 mL Intravenous Q12H   Continuous Infusions: . sodium chloride     PRN Meds: sodium chloride, acetaminophen, ipratropium-albuterol, ondansetron (ZOFRAN) IV, sodium chloride flush  Time spent: 35 minutes  Author: Berle Mull, MD [facility] Pager: [phone] 08/12/2017 6:18 PM  If 7PM-7AM, please contact night-coverage at [email or web address], password [facility]

## 2017-08-12 NOTE — Evaluation (Signed)
Physical Therapy Evaluation Patient Details Name: Diane Gilbert MRN: [id number] DOB: 1965/09/22 Today's Date: 08/12/2017   History of Present Illness  52yo female recently hospitalized due to decompensated heart failure, pulmonary HTN, cor pulmonae. Arrived with O2 in 80s, B LE edema, diagnosed with acute respiratory failure, diastolic heart failure, cor pulmonae. PMH alcohol and polysubstance abuse, anxiety, OA, CHF, depression, interstitial lung disease, O2 dependency, schizophrenia, SOB, back surgery, hx thorascopy with wedge resection   Clinical Impression   Patient received in bed, pleasant and willing to work with PT, O2 95% on 5LPM from wall. She is able to perform functional bed mobility with S, functional transfers with gait approximately 363f with RW and with O2 remaining in the 90s during gait period. She is able to ambulate without assistive device but is grossly unsteady and often reaches for external support; of note is also reports of falls at home. She was left sitting up at the side of the bed with all needs met. Moving forward she will likely benefit from skilled OP PT services to further address functional deficits and reduce fall risk.     Follow Up Recommendations Outpatient PT    Equipment Recommendations  Rolling walker with 5" wheels;Other (comment)(shower seat )    Recommendations for Other Services       Precautions / Restrictions Precautions Precautions: Fall Restrictions Weight Bearing Restrictions: No      Mobility  Bed Mobility Overal bed mobility: Needs Assistance Bed Mobility: Supine to Sit     Supine to sit: Supervision     General bed mobility comments: increased time   Transfers Overall transfer level: Needs assistance Equipment used: Rolling walker (2 wheeled) Transfers: Sit to/from Stand Sit to Stand: Min guard         General transfer comment: cues for safety  Ambulation/Gait Ambulation/Gait assistance:  Supervision Ambulation Distance (Feet): 300 Feet Assistive device: Rolling walker (2 wheeled) Gait Pattern/deviations: Step-through pattern;Decreased step length - right;Decreased step length - left;Decreased stride length     General Gait Details: gait generall appears WNL, slow but steady with RW; she is able to ambulate without device, but is generally unsteady and often reaches for external support. O2 remained in the 90s on 6LPM.   Stairs            Wheelchair Mobility    Modified Rankin (Stroke Patients Only)       Balance Overall balance assessment: Mild deficits observed, not formally tested;History of Falls                                           Pertinent Vitals/Pain Pain Assessment: No/denies pain    Home Living Family/patient expects to be discharged to:: Private residence Living Arrangements: Other relatives Available Help at Discharge: Family;Friend(s) Type of Home: Apartment Home Access: Stairs to enter Entrance Stairs-Rails: Can reach both Entrance Stairs-Number of Steps: 1 Home Layout: One level Home Equipment: Walker - 2 wheels;Bedside commode      Prior Function Level of Independence: Independent         Comments: O2 dependent at baseline      Hand Dominance        Extremity/Trunk Assessment        Lower Extremity Assessment Lower Extremity Assessment: Overall WFL for tasks assessed    Cervical / Trunk Assessment Cervical / Trunk Assessment: Kyphotic  Communication   Communication: No  difficulties  Cognition Arousal/Alertness: Awake/alert Behavior During Therapy: WFL for tasks assessed/performed Overall Cognitive Status: Within Functional Limits for tasks assessed                                        General Comments General comments (skin integrity, edema, etc.): generally unsteady without assistive device, reports multiple falls at home     Exercises     Assessment/Plan    PT  Assessment Patient needs continued PT services  PT Problem List Decreased strength;Decreased mobility;Decreased safety awareness;Decreased coordination;Obesity;Decreased activity tolerance;Decreased balance       PT Treatment Interventions DME instruction;Therapeutic activities;Gait training;Therapeutic exercise;Patient/family education;Stair training;Balance training;Functional mobility training;Neuromuscular re-education    PT Goals (Current goals can be found in the Care Plan section)  Acute Rehab PT Goals Patient Stated Goal: to feel better  PT Goal Formulation: With patient Time For Goal Achievement: 08/19/17 Potential to Achieve Goals: Good    Frequency Min 3X/week   Barriers to discharge        Co-evaluation               AM-PAC PT "6 Clicks" Daily Activity  Outcome Measure Difficulty turning over in bed (including adjusting bedclothes, sheets and blankets)?: None Difficulty moving from lying on back to sitting on the side of the bed? : None Difficulty sitting down on and standing up from a chair with arms (e.g., wheelchair, bedside commode, etc,.)?: None Help needed moving to and from a bed to chair (including a wheelchair)?: A Little Help needed walking in hospital room?: A Little Help needed climbing 3-5 steps with a railing? : A Little 6 Click Score: 21    End of Session Equipment Utilized During Treatment: Gait belt;Oxygen Activity Tolerance: Patient tolerated treatment well Patient left: in bed;with call bell/phone within reach;Other (comment)(sitting at EOB per her request )   PT Visit Diagnosis: Unsteadiness on feet (R26.81);Muscle weakness (generalized) (M62.81);History of falling (Z91.81)    Time: 1035-1101 PT Time Calculation (min) (ACUTE ONLY): 26 min   Charges:   PT Evaluation $PT Eval Moderate Complexity: 1 Mod PT Treatments $Gait Training: 8-22 mins   PT G Codes:        Deniece Ree PT, DPT, CBIS  Supplemental Physical Therapist [place]   Pager [phone]

## 2017-08-13 LAB — BASIC METABOLIC PANEL
Anion gap: 12 (ref 5–15)
BUN: 9 mg/dL (ref 6–20)
CO2: 31 mmol/L (ref 22–32)
CREATININE: 0.6 mg/dL (ref 0.44–1.00)
Calcium: 8.4 mg/dL — ABNORMAL LOW (ref 8.9–10.3)
Chloride: 95 mmol/L — ABNORMAL LOW (ref 101–111)
GFR calc Af Amer: >60 mL/min (ref >60)
Glucose, Bld: 167 mg/dL — ABNORMAL HIGH (ref 65–99)
POTASSIUM: 3.6 mmol/L (ref 3.5–5.1)
SODIUM: 138 mmol/L (ref 135–145)

## 2017-08-13 LAB — GLUCOSE, CAPILLARY
GLUCOSE-CAPILLARY: 109 mg/dL — AB (ref 65–99)
GLUCOSE-CAPILLARY: 123 mg/dL — AB (ref 65–99)

## 2017-08-13 LAB — CBC
HCT: 50.2 % — ABNORMAL HIGH (ref 36.0–46.0)
Hemoglobin: 13.8 g/dL (ref 12.0–15.0)
MCH: 20.4 pg — ABNORMAL LOW (ref 26.0–34.0)
MCHC: 27.5 g/dL — AB (ref 30.0–36.0)
MCV: 74 fL — ABNORMAL LOW (ref 78.0–100.0)
Platelets: 318 10*3/uL (ref 150–400)
RBC: 6.78 MIL/uL — ABNORMAL HIGH (ref 3.87–5.11)
RDW: 24.1 % — AB (ref 11.5–15.5)
WBC: 9.9 10*3/uL (ref 4.0–10.5)

## 2017-08-13 MED ORDER — FUROSEMIDE 40 MG PO TABS: 40.0000 mg | ORAL_TABLET | Freq: Two times a day (BID) | ORAL | 0 refills | Status: DC

## 2017-08-13 MED ORDER — GABAPENTIN 100 MG PO CAPS: 100.0000 mg | ORAL_CAPSULE | Freq: Three times a day (TID) | ORAL | 0 refills | Status: DC

## 2017-08-13 MED ORDER — PREDNISONE 10 MG PO TABS: ORAL_TABLET | ORAL | 0 refills | Status: DC

## 2017-08-13 NOTE — Care Management Note (Signed)
Case Management Note Marvetta Gibbons RN, BSN Unit 4E-Case Manager [phone]  Patient Details  Name: KRISTIEN SALATINO MRN: [id number] Date of Birth: 1966-01-25  Subjective/Objective:  Pt admitted with acute resp. failure                Action/Plan: PTA pt lived at home, has home 02 base line 2L with [facility], and cpap- pt for d/c home today- will need increased liter flow for home 02 and HHRN- orders have been placed- spoke with pt at bedside choice offered for [facility] services- pt would like to use [facility]- for [facility] needs- pt also states that she has an aide that comes 2hr/day 7days/week via [place]. Call made to [facility] with [facility] for [facility] and DME needs-  [facility] will update home 02 needs- pt states family will bring 02 tank for transport.  Expected Discharge Date:  08/13/17               Expected Discharge Plan:  [place]  In-House Referral:  NA  Discharge planning Services  CM Consult  Post Acute Care Choice:  Home Health, Durable Medical Equipment Choice offered to:  Patient  DME Arranged:  Oxygen DME Agency:  [place]:  RN, Disease Management [place] Agency:  [facility]  Status of Service:  Completed, signed off  If discussed at [place] of Stay Meetings, dates discussed:    Discharge Disposition: home/home health   Additional Comments:  Dawayne Patricia, RN 08/13/2017, 12:29 PM

## 2017-08-13 NOTE — Progress Notes (Addendum)
SATURATION QUALIFICATIONS: (This note is used to comply with regulatory documentation for home oxygen)  Patient Saturations on Room Air at Rest = 84%  Patient Saturations on Room Air while Ambulating = 84%  Patient Saturations on 4 Liters of oxygen while Ambulating = 92%  Please briefly explain why patient needs home oxygen

## 2017-08-13 NOTE — Progress Notes (Signed)
Pt discharged home with family. IV and telemetry box removed. Pt received discharge instructions and all questions were answered. Pt aware that she requires more oxygen now than prior to admission. Pt left with all of her belongings. Pt left via wheelchair and was accompanied by a Wilfrid Lund.    Grant Fontana BSN, RN

## 2017-08-13 NOTE — Progress Notes (Signed)
Physical Therapy Treatment Patient Details Name: Diane Gilbert MRN: [id number] DOB: 08/12/1965 Today's Date: 08/13/2017    History of Present Illness 52yo female recently hospitalized due to decompensated heart failure, pulmonary HTN, cor pulmonae. Arrived with O2 in 80s, B LE edema, diagnosed with acute respiratory failure, diastolic heart failure, cor pulmonae. PMH alcohol and polysubstance abuse, anxiety, OA, CHF, depression, interstitial lung disease, O2 dependency, schizophrenia, SOB, back surgery, hx thorascopy with wedge resection     PT Comments    Pt performed gait training and stair training to prepare for safe entry into home at d/c   Performed walking O2 sats on RA ( see details below).  Pt continues to progress well and will benefit from outpatient therapies to improve functional capacity and and endurance.    SATURATION QUALIFICATIONS: (This note is used to comply with regulatory documentation for home oxygen)  Patient Saturations on Room Air at Rest = 88%  Patient Saturations on Room Air while Ambulating = 84%  Patient Saturations on 4 Liters of oxygen while Ambulating = 91%  Please briefly explain why patient needs home oxygen: desaturation with activity.   Follow Up Recommendations  Outpatient PT     Equipment Recommendations  Rolling walker with 5" wheels;Other (comment)    Recommendations for Other Services       Precautions / Restrictions Precautions Precautions: Fall Restrictions Weight Bearing Restrictions: No    Mobility  Bed Mobility               General bed mobility comments: Pt sitting on bed with R leg in and L leg out, able to advance B LEs to edge of bed and scoot forward unassisted.   Transfers Overall transfer level: Needs assistance Equipment used: Rolling walker (2 wheeled) Transfers: Sit to/from Stand Sit to Stand: Supervision         General transfer comment: Pt performed without assistance cues for RW safety to  back entirely to seated surface before sitting.    Ambulation/Gait Ambulation/Gait assistance: Supervision Ambulation Distance (Feet): 450 Feet Assistive device: Rolling walker (2 wheeled) Gait Pattern/deviations: Step-through pattern;Decreased step length - right;Decreased step length - left;Decreased stride length;Trunk flexed   Gait velocity interpretation: Below normal speed for age/gender General Gait Details: Gait is generally steady but on RA she desaturates to 85% and requires 4LPM to improve O2 sats to 91%.  Pt required cues for for safety with turns to keep RW grounded to improve balance.     Stairs Stairs: Yes   Stair Management: One rail Left(to simulate HHA on L side.  ) Number of Stairs: 2 General stair comments: Cues for sequencing and simulated HHA for safe entry into home. Educated to place RW at the top for support and safety.    Wheelchair Mobility    Modified Rankin (Stroke Patients Only)       Balance Overall balance assessment: Needs assistance   Sitting balance-Leahy Scale: Normal       Standing balance-Leahy Scale: Fair Standing balance comment: Intermittant reliance on UE support.                              Cognition Arousal/Alertness: Awake/alert Behavior During Therapy: WFL for tasks assessed/performed Overall Cognitive Status: Within Functional Limits for tasks assessed  Exercises      General Comments        Pertinent Vitals/Pain Pain Assessment: No/denies pain    Home Living                      Prior Function            PT Goals (current goals can now be found in the care plan section) Acute Rehab PT Goals Patient Stated Goal: to feel better  Potential to Achieve Goals: Good Progress towards PT goals: Progressing toward goals    Frequency    Min 3X/week      PT Plan Current plan remains appropriate    Co-evaluation               AM-PAC PT "6 Clicks" Daily Activity  Outcome Measure  Difficulty turning over in bed (including adjusting bedclothes, sheets and blankets)?: None Difficulty moving from lying on back to sitting on the side of the bed? : None Difficulty sitting down on and standing up from a chair with arms (e.g., wheelchair, bedside commode, etc,.)?: None Help needed moving to and from a bed to chair (including a wheelchair)?: A Little Help needed walking in hospital room?: A Little Help needed climbing 3-5 steps with a railing? : A Little 6 Click Score: 21    End of Session Equipment Utilized During Treatment: Gait belt;Oxygen Activity Tolerance: Patient tolerated treatment well Patient left: in bed;with call bell/phone within reach;Other (comment) Nurse Communication: Mobility status PT Visit Diagnosis: Unsteadiness on feet (R26.81);Muscle weakness (generalized) (M62.81);History of falling (Z91.81)     Time: 5909-3112 PT Time Calculation (min) (ACUTE ONLY): 23 min  Charges:  $Gait Training: 8-22 mins $Therapeutic Activity: 8-22 mins                    G Codes:       Governor Rooks, PTA pager [phone]    Cristela Blue 08/13/2017, 10:39 AM

## 2017-08-14 NOTE — Discharge Summary (Signed)
[facility] Discharge Summary   Patient: Diane Gilbert TGG:[id number]   PCP: Nolene Ebbs, MD DOB: Jul 01, 1965   Date of admission: 08/09/2017   Date of discharge: 08/13/2017    Discharge Diagnoses:  Active Problems:   Acute respiratory failure with hypoxia ([place])   Admitted From: home Disposition:  home  Recommendations for Outpatient Follow-up:  1. Please follow-up with PCP in 1 week. 2. Continue taking medication as recommended.  Follow-up Information    Nolene Ebbs, MD. Schedule an appointment as soon as possible for a visit in 1 week(s).   Specialty:  Internal Medicine Contact information: [place] [address] [place] Follow up.   Why:  already has home 27- [facility] will update orders- family to bring tank for transport Contact information: [place] [address] [phone]        [facility] Follow up.   Specialty:  [place] Why:  HHRN arranged - they will call you to set up home visits Contact information: [address] [place] [address] [phone]          Diet recommendation: Cardiac diet  Activity: The patient is advised to gradually reintroduce usual activities.  Discharge Condition: good  Code Status: full code  History of present illness: As per the H and P dictated on admission, "52 year old female active smoker followed for RB-ILD, chronic hypoxic and hypercarbic respiratory failure on home oxygen at 2 L obstructive sleep apnea CPAP noncompliant, diastolic heart failure, pulmonary hypertension and cor pulmonale.  Patient has schizoaffective disorder, polysubstance abuse with cocaine and alcohol.  She was recently admitted to the hospital last month for decompensated diastolic heart failure and acute on chronic hypoxic and hypercarbic respiratory failure.  Patient was treated with aggressive diuresis. Patient  says since discharge she has noticed that her breathing has been getting worse she is more short of breath and retaining more fluids.  Patient says she has not been taking her medicines on a regular basis.  She does admit to cocaine use.  On arrival to the office today patient did not have on her oxygen O2 saturations were in the low 80s.  Patient says she is been retaining fluid with significant leg swelling.  Patient is somewhat drowsy intermittently.  She denies any hemoptysis fever abdominal pain nausea vomiting or diarrhea."  Hospital Course:  Summary of her active problems in the hospital is as following.  Acute on chronic respiratory failure - multifactorial in setting RB-ILD (active smoker), restrictive lung disease, decompensated OSA as well as decompensated heart failure.  RB-ILD smoker , Restrictive lung disease  Pulmonary HTN  OSA/OHS- noncompliant with CPAP Stressed qhs cpap compliance but pt refused  Continue dulera  Wean solumedrol switch to oral prednisone.  Acute on chronic Diastolic Heart Failure Cor pulmonale  BNP 466.9 Stable cardiomegaly per CXR 2/11 ? edema Abnormal EKG on admission Continue Lasix daily.  Hypomagnesia Hypokalemia Replaced   Schizoaffective disorder  Polysubstance abuse >> UDS + for cocaine 2/11 ETOH < 10 Sudden change LOC after boyfriend visited 2/12/ early am Suspect street drugs being supplied by boyfriend Avoid sedating rx Decrease qhs seroquel to 163m qhs -- ??compliance at home.  Consider repeat drug screen -- ?getting narcotics from outside Re-check urine drug screen ContAbilify  All other chronic medical condition were stable during the hospitalization.  Patient was seen by physical therapy, who recommended home health,  which was arranged by Education officer, museum and case Freight forwarder. On the day of the discharge the patient's vitals were stable, and no other acute medical condition were reported by patient. the patient was felt safe  to be discharge at home with home health.  Procedures and Results:  none   Consultations:  none  DISCHARGE MEDICATION: Allergies as of 08/13/2017   No Known Allergies     Medication List    STOP taking these medications   buPROPion 150 MG 12 hr tablet Commonly known as:  WELLBUTRIN SR   traZODone 100 MG tablet Commonly known as:  DESYREL     TAKE these medications   albuterol 108 (90 Base) MCG/ACT inhaler Commonly known as:  PROVENTIL HFA;VENTOLIN HFA Inhale 2 puffs into the lungs every 6 (six) hours as needed for wheezing or shortness of breath.   amLODipine 5 MG tablet Commonly known as:  NORVASC Take 5 mg daily by mouth.   budesonide-formoterol 160-4.5 MCG/ACT inhaler Commonly known as:  SYMBICORT Inhale 2 puffs into the lungs 2 (two) times daily. Notes to patient:  You did not receive this medication during your hospital stay. You may resume it after discharge.   furosemide 40 MG tablet Commonly known as:  LASIX Take 1 tablet (40 mg total) by mouth 2 (two) times daily. For 5 days, after that take 40 mg daily What changed:    when to take this  additional instructions   gabapentin 100 MG capsule Commonly known as:  NEURONTIN Take 1 capsule (100 mg total) by mouth 3 (three) times daily. What changed:    medication strength  how much to take  when to take this  additional instructions   Misc. Devices Misc CPAP @@ Night   nicotine 14 mg/24hr patch Commonly known as:  NICODERM CQ - dosed in mg/24 hours Place 1 patch (14 mg total) daily onto the skin. What changed:  Another medication with the same name was removed. Continue taking this medication, and follow the directions you see here. Notes to patient:  You did not receive this medication during your hospital stay. You may resume it after discharge.   OXYGEN Inhale 2 L into the lungs continuous. Notes to patient:  You will need 3 liters of oxygen at rest and 4 liters when ambulating.     predniSONE 10 MG tablet Commonly known as:  DELTASONE Take 78m daily for 3days,Take 373mdaily for 3days,Take 209maily for 3days,Take 24m73mily for 3days, then stop   QUEtiapine 300 MG tablet Commonly known as:  SEROQUEL Take 1 tablet (300 mg total) by mouth at bedtime.      No Known Allergies Discharge Instructions    Diet - low sodium heart healthy   Complete by:  As directed    Increase activity slowly   Complete by:  As directed      Discharge Exam: Filed Weights   08/11/17 0420 08/12/17 0445 08/13/17 0235  Weight: 111.9 kg (246 lb 11.1 oz) 106 kg (233 lb 11.2 oz) 105.8 kg (233 lb 4.8 oz)   Vitals:   08/13/17 0916 08/13/17 1002  BP:    Pulse:    Resp:    Temp:    SpO2: 96% (!) 82%   General: Appear in no distress, no Rash; Oral Mucosa moist. Cardiovascular: S1 and S2 Present, no Murmur, no JVD Respiratory: Bilateral Air entry present and basal Crackles, no wheezes Abdomen: Bowel Sound present, Soft and no tenderness Extremities: no Pedal edema, no calf tenderness  Neurology: Grossly no focal neuro deficit.  The results of significant diagnostics from this hospitalization (including imaging, microbiology, ancillary and laboratory) are listed below for reference.    Significant Diagnostic Studies: Dg Chest Port 1 View  Result Date: 08/11/2017 CLINICAL DATA:  Acute respiratory failure with hypoxemia. Pathologically diagnosed RB ILD. EXAM: PORTABLE CHEST 1 VIEW COMPARISON:  08/09/2017, 05/09/2017, 03/23/2017, 07/04/2016 and CT chest 03/23/2017. FINDINGS: Trachea is midline. Heart is enlarged. Pulmonary arteries appear prominent. Mild diffuse ground-glass appearance in the lungs, similar to prior exams. No definite pleural fluid. IMPRESSION: Mild chronic ground-glass appearance in the lungs bilaterally, in keeping with the known diagnosis of respiratory bronchiolitis interstitial lung disease. No definite superimposed acute findings. Electronically Signed   By: Lorin Picket M.D.   On: 08/11/2017 08:32   Dg Chest Port 1 View  Result Date: 08/09/2017 CLINICAL DATA:  Acute respiratory failure. EXAM: PORTABLE CHEST 1 VIEW COMPARISON:  Radiograph of July 24, 2017. FINDINGS: Stable cardiomegaly. No pneumothorax or pleural effusion is noted. Stable diffuse lung densities are noted concerning for interstitial lung disease, although superimposed edema or inflammation cannot be excluded. Bony thorax is unremarkable. IMPRESSION: Stable cardiomegaly. Stable diffuse bilateral lung densities are noted most consistent with chronic interstitial lung disease, although acute superimposed edema or inflammation cannot be excluded. Electronically Signed   By: Marijo Conception, M.D.   On: 08/09/2017 17:40   Dg Chest Portable 1 View  Result Date: 07/24/2017 CLINICAL DATA:  Dyspnea EXAM: PORTABLE CHEST 1 VIEW COMPARISON:  05/09/2017 chest radiograph. FINDINGS: Stable cardiomediastinal silhouette with mild cardiomegaly. No pneumothorax. No pleural effusion. Diffuse hazy and linear parahilar opacities in both lungs. IMPRESSION: Stable cardiomegaly with hazy and linear parahilar opacities throughout both lungs, most compatible with moderate congestive heart failure. Electronically Signed   By: Ilona Sorrel M.D.   On: 07/24/2017 20:05    Microbiology: No results found for this or any previous visit (from the past 240 hour(s)).   Labs: CBC: Recent Labs  Lab 08/09/17 1713 08/10/17 0238 08/11/17 0258 08/13/17 0339  WBC 9.7 8.2 6.7 9.9  NEUTROABS 5.2  --   --   --   HGB 14.2 13.2 13.8 13.8  HCT 51.5* 48.9* 50.9* 50.2*  MCV 73.8* 74.3* 74.2* 74.0*  PLT 214 252 279 025   Basic Metabolic Panel: Recent Labs  Lab 08/09/17 1713 08/10/17 0238 08/10/17 1830 08/11/17 0258 08/12/17 0339 08/13/17 0339  NA 141 142 139 137 139 138  K 3.3* 3.2* 4.0 4.4 3.2* 3.6  CL 97* 98* 95* 95* 92* 95*  CO2 32 33* 33* 33* 34* 31  GLUCOSE 111* 86 180* 144* 166* 167*  BUN <5* <5* <5* 5* 8 9    CREATININE 0.64 0.65 0.77 0.67 0.65 0.60  CALCIUM 8.4* 8.3* 8.3* 8.4* 8.6* 8.4*  MG 1.6*  --   --  1.7  --   --   PHOS 2.9  --   --  3.6  --   --    Liver Function Tests: Recent Labs  Lab 08/09/17 1713  AST 26  ALT 19  ALKPHOS 80  BILITOT 1.5*  PROT 8.0  ALBUMIN 2.4*   No results for input(s): LIPASE, AMYLASE in the last 168 hours. No results for input(s): AMMONIA in the last 168 hours. Cardiac Enzymes: No results for input(s): CKTOTAL, CKMB, CKMBINDEX, TROPONINI in the last 168 hours. BNP (last 3 results) Recent Labs    07/24/17 1911 08/09/17 1713 08/12/17 0339  BNP 467.5* 466.9* 405.5*  CBG: Recent Labs  Lab 08/12/17 1143 08/12/17 1646 08/12/17 2115 08/13/17 0617 08/13/17 1205  GLUCAP 199* 146* 178* 109* 123*   Time spent: 35 minutes  Signed:  Berle Mull  [facility] 08/13/2017 , 2:14 PM

## 2017-08-19 ENCOUNTER — Telehealth: Payer: Self-pay

## 2017-08-19 NOTE — Telephone Encounter (Signed)
Attempted PA through [facility] for Symbicort.  Approved for 2 years PA # [id number] Interaction ID [id number]  [facility] on [address] and I was on hold for over 7 mins and nobody came to the phone. Will try again later.

## 2017-09-24 ENCOUNTER — Ambulatory Visit: Payer: Self-pay | Admitting: Pulmonary Disease

## 2017-10-21 NOTE — Telephone Encounter (Signed)
Called [facility] on [address] They did receive the PA approval, pt Symbicort copay is $3 Will sign off

## 2017-10-28 ENCOUNTER — Other Ambulatory Visit: Payer: Self-pay | Admitting: Internal Medicine

## 2017-10-28 DIAGNOSIS — Z1231 Encounter for screening mammogram for malignant neoplasm of breast: Secondary | ICD-10-CM

## 2017-11-08 ENCOUNTER — Other Ambulatory Visit: Payer: Self-pay

## 2017-11-08 ENCOUNTER — Ambulatory Visit: Payer: Self-pay | Admitting: Pulmonary Disease

## 2017-11-08 ENCOUNTER — Encounter (HOSPITAL_COMMUNITY): Payer: Self-pay | Admitting: Emergency Medicine

## 2017-11-08 ENCOUNTER — Ambulatory Visit (HOSPITAL_COMMUNITY)
Admission: EM | Admit: 2017-11-08 | Discharge: 2017-11-08 | Disposition: A | Discharge status: Home / Self Care | Payer: Medicaid Other | Attending: Family Medicine | Admitting: Family Medicine

## 2017-11-08 DIAGNOSIS — F17210 Nicotine dependence, cigarettes, uncomplicated: Secondary | ICD-10-CM | POA: Diagnosis not present

## 2017-11-08 DIAGNOSIS — M79652 Pain in left thigh: Secondary | ICD-10-CM

## 2017-11-08 DIAGNOSIS — Z825 Family history of asthma and other chronic lower respiratory diseases: Secondary | ICD-10-CM | POA: Insufficient documentation

## 2017-11-08 DIAGNOSIS — R2241 Localized swelling, mass and lump, right lower limb: Secondary | ICD-10-CM

## 2017-11-08 DIAGNOSIS — J9611 Chronic respiratory failure with hypoxia: Secondary | ICD-10-CM | POA: Insufficient documentation

## 2017-11-08 DIAGNOSIS — R2242 Localized swelling, mass and lump, left lower limb: Secondary | ICD-10-CM | POA: Diagnosis not present

## 2017-11-08 DIAGNOSIS — Z9119 Patient's noncompliance with other medical treatment and regimen: Secondary | ICD-10-CM | POA: Insufficient documentation

## 2017-11-08 DIAGNOSIS — R0602 Shortness of breath: Secondary | ICD-10-CM

## 2017-11-08 DIAGNOSIS — Z79899 Other long term (current) drug therapy: Secondary | ICD-10-CM | POA: Diagnosis not present

## 2017-11-08 DIAGNOSIS — J9610 Chronic respiratory failure, unspecified whether with hypoxia or hypercapnia: Secondary | ICD-10-CM | POA: Diagnosis not present

## 2017-11-08 DIAGNOSIS — L03311 Cellulitis of abdominal wall: Secondary | ICD-10-CM

## 2017-11-08 DIAGNOSIS — Z9981 Dependence on supplemental oxygen: Secondary | ICD-10-CM | POA: Diagnosis not present

## 2017-11-08 DIAGNOSIS — M7989 Other specified soft tissue disorders: Secondary | ICD-10-CM | POA: Diagnosis not present

## 2017-11-08 DIAGNOSIS — L0291 Cutaneous abscess, unspecified: Secondary | ICD-10-CM | POA: Diagnosis present

## 2017-11-08 DIAGNOSIS — M79651 Pain in right thigh: Secondary | ICD-10-CM

## 2017-11-08 DIAGNOSIS — F209 Schizophrenia, unspecified: Secondary | ICD-10-CM | POA: Insufficient documentation

## 2017-11-08 DIAGNOSIS — Z803 Family history of malignant neoplasm of breast: Secondary | ICD-10-CM | POA: Insufficient documentation

## 2017-11-08 DIAGNOSIS — G4733 Obstructive sleep apnea (adult) (pediatric): Secondary | ICD-10-CM | POA: Diagnosis not present

## 2017-11-08 LAB — POCT I-STAT, CHEM 8
BUN: 9 mg/dL (ref 6–20)
CALCIUM ION: 1.09 mmol/L — AB (ref 1.15–1.40)
Chloride: 102 mmol/L (ref 101–111)
Creatinine, Ser: 0.8 mg/dL (ref 0.44–1.00)
GLUCOSE: 97 mg/dL (ref 65–99)
HCT: 51 % — ABNORMAL HIGH (ref 36.0–46.0)
HEMOGLOBIN: 17.3 g/dL — AB (ref 12.0–15.0)
POTASSIUM: 4.2 mmol/L (ref 3.5–5.1)
Sodium: 141 mmol/L (ref 135–145)
TCO2: 28 mmol/L (ref 22–32)

## 2017-11-08 LAB — CBC
HCT: 47 % — ABNORMAL HIGH (ref 36.0–46.0)
Hemoglobin: 13.4 g/dL (ref 12.0–15.0)
MCH: 20.4 pg — ABNORMAL LOW (ref 26.0–34.0)
MCHC: 28.5 g/dL — AB (ref 30.0–36.0)
MCV: 71.4 fL — AB (ref 78.0–100.0)
PLATELETS: 306 10*3/uL (ref 150–400)
RBC: 6.58 MIL/uL — ABNORMAL HIGH (ref 3.87–5.11)
RDW: 22.7 % — AB (ref 11.5–15.5)
WBC: 8.9 10*3/uL (ref 4.0–10.5)

## 2017-11-08 MED ORDER — KETOROLAC TROMETHAMINE 60 MG/2ML IM SOLN
INTRAMUSCULAR | Status: AC
Start: 2017-11-08 — End: ?
  Filled 2017-11-08: qty 2

## 2017-11-08 MED ORDER — KETOROLAC TROMETHAMINE 60 MG/2ML IM SOLN
60.0000 mg | Freq: Once | INTRAMUSCULAR | Status: AC
  Administered 2017-11-08: 60 mg via INTRAMUSCULAR

## 2017-11-08 MED ORDER — CEPHALEXIN 500 MG PO CAPS: 500.0000 mg | ORAL_CAPSULE | Freq: Four times a day (QID) | ORAL | 0 refills | Status: DC

## 2017-11-08 MED ORDER — CEFTRIAXONE SODIUM 1 G IJ SOLR
INTRAMUSCULAR | Status: AC
  Filled 2017-11-08: qty 10

## 2017-11-08 MED ORDER — CEFTRIAXONE SODIUM 1 G IJ SOLR
1.0000 g | Freq: Once | INTRAMUSCULAR | Status: AC
  Administered 2017-11-08: 1 g via INTRAMUSCULAR

## 2017-11-08 NOTE — ED Provider Notes (Signed)
[place]    CSN: [id number] Arrival date & time: 11/08/17  1254     History   Chief Complaint Chief Complaint  Patient presents with  . Abscess    HPI Diane Gilbert is a 52 y.o. female.   HPI  Difficult patient here for evaluation. She has a long history of noncompliance with multiple hospital visits. She admits to drinking beer this morning prior to coming in.  She is somewhat somnolent, difficult historian. She is here for infection in her thighs.  She states inside of both her thighs is red hot and swollen.  She states is been gradually getting this way for a month.  No fever or chills.  Is very painful over the last couple of days.  She also has swelling in her ankles.  She states that is chronic.  She states that it is worse today because she did not take her Lasix this morning.  She states she takes her Lasix intermittently.  She states she never takes her Lasix if she has to leave the house. She denies chest pain, palpitations, chest pressure. She has chronic respiratory failure and shortness of breath requiring oxygen.  She arrived without oxygen with oxygen saturations near 80.  Once we got her back on her oxygen she had normal saturations throughout her visit.  We kept her on oxygen at 2 L.  She states her shortness of breath is at her baseline.   Past Medical History:  Diagnosis Date  . Alcohol abuse   . Anxiety   . Arthritis   . CHF (congestive heart failure) ([place])   . Depression   . Headache(784.0)   . Hypertension   . Insomnia   . Interstitial lung disease ([place])   . Oxygen dependent   . Schizophrenia ([place])   . Shortness of breath dyspnea   . Sleep apnea   . Sleep apnea     Patient Active Problem List   Diagnosis Date Noted  . Acute diastolic heart failure ([place]) 07/24/2017  . Acute on chronic diastolic CHF (congestive heart failure) ([place]) 05/09/2017  . Respiratory failure, acute and chronic ([place]) 05/09/2017  . Facial swelling  03/23/2017  . Chronic respiratory failure with hypoxia ([place]) 03/23/2017  . Acute respiratory failure with hypoxia ([place]) 03/23/2017  . Community acquired pneumonia   . OSA (obstructive sleep apnea) 07/21/2016  . Increased oxygen demand 07/04/2016  . Acute diastolic congestive heart failure ([place])   . Hypoxia 03/24/2016  . Schizophrenia ([place]) 03/24/2016  . Leg swelling 01/16/2016  . Dyspnea 09/06/2015  . Acute on chronic respiratory failure with hypoxia ([place]) 06/07/2014  . Respiratory bronchiolitis interstitial lung disease ([place]) 12/26/2013  . Irregular menstrual cycle 01/23/2013  . Smoking addiction 01/23/2013  . Alcohol abuse 02/19/2012  . Hyponatremia 02/19/2012  . Abdominal pain 02/19/2012  . Hypokalemia 02/19/2012    Past Surgical History:  Procedure Laterality Date  . BACK SURGERY    . LUNG BIOPSY Left 07/16/2014   Procedure: LUNG BIOPSY;  Surgeon: Grace Isaac, MD;  Location: [facility];  Service: Thoracic;  Laterality: Left;  Marland Kitchen MULTIPLE TOOTH EXTRACTIONS    . VIDEO ASSISTED THORACOSCOPY Left 07/16/2014   Procedure: VIDEO ASSISTED THORACOSCOPY;  Surgeon: Grace Isaac, MD;  Location: [facility];  Service: Thoracic;  Laterality: Left;  Marland Kitchen VIDEO BRONCHOSCOPY Bilateral 04/11/2014   Procedure: VIDEO BRONCHOSCOPY WITH FLUORO;  Surgeon: Kathee Delton, MD;  Location: [facility];  Service: Cardiopulmonary;  Laterality: Bilateral;  . VIDEO BRONCHOSCOPY  N/A 07/16/2014   Procedure: VIDEO BRONCHOSCOPY;  Surgeon: Grace Isaac, MD;  Location: [facility] OR;  Service: Thoracic;  Laterality: N/A;  . WEDGE RESECTION Left 07/16/2014   Procedure: WEDGE RESECTION;  Surgeon: Grace Isaac, MD;  Location: [facility] OR;  Service: Thoracic;  Laterality: Left;  left upper lobe lung resection    OB History    Gravida  0   Para  0   Term  0   Preterm  0   AB  0   Living  0     SAB  0   TAB  0   Ectopic  0   Multiple  0   Live Births               Home Medications    Prior to  Admission medications   Medication Sig Start Date End Date Taking? Authorizing Provider  albuterol (PROVENTIL HFA;VENTOLIN HFA) 108 (90 BASE) MCG/ACT inhaler Inhale 2 puffs into the lungs every 6 (six) hours as needed for wheezing or shortness of breath. 05/09/15   Juanito Doom, MD  amLODipine (NORVASC) 5 MG tablet Take 5 mg daily by mouth.    [provider]  budesonide-formoterol (SYMBICORT) 160-4.5 MCG/ACT inhaler Inhale 2 puffs into the lungs 2 (two) times daily. Patient not taking: Reported on 08/11/2017 03/23/17   Parrett, Fonnie Mu, NP  cephALEXin (KEFLEX) 500 MG capsule Take 1 capsule (500 mg total) by mouth 4 (four) times daily. 11/08/17   Raylene Everts, MD  furosemide (LASIX) 40 MG tablet Take 1 tablet (40 mg total) by mouth 2 (two) times daily. For 5 days, after that take 40 mg daily 08/13/17   Lavina Hamman, MD  gabapentin (NEURONTIN) 100 MG capsule Take 1 capsule (100 mg total) by mouth 3 (three) times daily. 08/13/17   Lavina Hamman, MD  Misc. Devices MISC CPAP @@ Night    [provider]  nicotine (NICODERM CQ - DOSED IN MG/24 HOURS) 14 mg/24hr patch Place 1 patch (14 mg total) daily onto the skin. Patient not taking: Reported on 08/11/2017 05/12/17   Geradine Girt, DO  OXYGEN Inhale 2 L into the lungs continuous.    [provider]  predniSONE (DELTASONE) 10 MG tablet Take 50m daily for 3days,Take 350mdaily for 3days,Take 2051maily for 3days,Take 9m18mily for 3days, then stop 08/13/17   PateLavina Hamman  QUEtiapine (SEROQUEL) 300 MG tablet Take 1 tablet (300 mg total) by mouth at bedtime. 06/18/17   ArfeKathlee Nations    Family History Family History  Problem Relation Age of Onset  . Breast cancer Mother   . Obstructive Sleep Apnea Mother   . COPD Maternal Aunt     Social History Social History   Tobacco Use  . Smoking status: Current Some Day Smoker    Packs/day: 1.50    Years: 30.00    Pack years: 45.00    Types: Cigarettes   . Smokeless tobacco: Never Used  . Tobacco comment: smoking 3 cigarettes daily "every now and then" 11/04/16  Substance Use Topics  . Alcohol use: No    Alcohol/week: 0.0 oz    Frequency: Never    Comment: Pt has previously abused alcohol , pt states she still drinks "every now and then"  . Drug use: Yes    Types: Cocaine     Allergies   Patient has no known allergies.   Review of Systems Review of Systems  Constitutional:  Negative for chills and fever.  HENT: Negative for congestion and dental problem.   Eyes: Negative for pain and visual disturbance.  Respiratory: Positive for shortness of breath. Negative for cough.        Chronic per patient.  Unchanged  Cardiovascular: Positive for leg swelling. Negative for chest pain and palpitations.  Gastrointestinal: Negative for abdominal pain, nausea and vomiting.  Genitourinary: Negative for dysuria and frequency.  Musculoskeletal: Positive for gait problem. Negative for arthralgias and back pain.       Hard to walk with thighs hurting  Skin: Positive for rash. Negative for color change.       Painful rash thighs  Neurological: Negative for seizures and syncope.  All other systems reviewed and are negative.    Physical Exam Triage Vital Signs ED Triage Vitals  Enc Vitals Group     BP 11/08/17 1358 140/88     Pulse Rate 11/08/17 1358 (!) 101     Resp 11/08/17 1358 20     Temp 11/08/17 1358 98.7 F (37.1 C)     Temp Source 11/08/17 1358 Oral     SpO2 11/08/17 1358 (!) 81 %     Weight --      Height --      Head Circumference --      Peak Flow --      Pain Score 11/08/17 1357 9     Pain Loc --      Pain Edu? --      Excl. in [place]? --    No data found.  Updated Vital Signs BP 140/88 (BP Location: Left Arm)   Pulse (!) 101   Temp 98.7 F (37.1 C) (Oral)   Resp 20   LMP 01/08/2017   SpO2 (!) 81% Comment: patient's o2 was turned off      Physical Exam  Constitutional: She appears well-developed and  well-nourished.  Somnolent  HENT:  Head: Normocephalic and atraumatic.  Oropharynx dry.  Teeth poor.  Face flushed  Eyes: Pupils are equal, round, and reactive to light. Conjunctivae are normal.  Neck: Neck supple.  Cardiovascular: Regular rhythm and normal heart sounds.  No murmur heard. Tachycardia  Pulmonary/Chest: Effort normal.   decreased breath sound.  No wheeze  Abdominal: Soft. There is no tenderness.  Musculoskeletal: She exhibits edema.  Tight edema from calf down  Neurological: Coordination normal.  Skin: Skin is warm and dry.     Skin on lower legs dry.  Skin on upper legs indurated, erythematous, tender as illustrated  Psychiatric:  Delayed responses.  Poor fund of knowledge.  Somnolent  Nursing note and vitals reviewed.    UC Treatments / Results  Labs (all labs ordered are listed, but only abnormal results are displayed) Labs Reviewed  CBC - Abnormal; Notable for the following components:      Result Value   RBC 6.58 (*)    HCT 47.0 (*)    MCV 71.4 (*)    MCH 20.4 (*)    MCHC 28.5 (*)    RDW 22.7 (*)    All other components within normal limits  POCT I-STAT, CHEM 8 - Abnormal; Notable for the following components:   Calcium, Ion 1.09 (*)    Hemoglobin 17.3 (*)    HCT 51.0 (*)    All other components within normal limits    EKG None  Radiology No results found.  Procedures Procedures (including critical care time)  Medications Ordered in UC Medications  cefTRIAXone (ROCEPHIN) injection  1 g (1 g Intramuscular Given 11/08/17 1732)  ketorolac (TORADOL) injection 60 mg (60 mg Intramuscular Given 11/08/17 1732)    Initial Impression / Assessment and Plan / UC Course  I have reviewed the triage vital signs and the nursing notes.  Pertinent labs & imaging results that were available during my care of the patient were reviewed by me and considered in my medical decision making (see chart for details).     This patient is clearly not a healthy  individual, and with her mental illness, substance abuse, and alcohol ingestion getting any health history is impossible.  She is afebrile.  Her white count is normal.  Her Chem-8 is not unlike her usual numbers.  I do not have any evidence that she has a systemic illness that would be dangerous for her.  The large area of erythema and tenderness is worrisome, especially given her unhealthy status in general and her history of noncompliance.  I did give her a shot of Rocephin 1 g and made her promise to come back in 2 days for follow-up. Final Clinical Impressions(s) / UC Diagnoses   Final diagnoses:  Cellulitis of abdominal wall  Greater than 50% of this visit was spent in counseling and coordinating care.  Total face to face time:   Patient was in the care of for a couple of hours waiting on lab test results.  She required multiple visits to check on her oxygenation and her status given her behavior, noncompliance, and hypoxia upon arrival.  I did spend a fair amount of time trying to get a history from her and then educating her on cellulitis, compliance, nutrition, fluids, and when to come back to the emergency room.  Her total face-to-face time was greater than 40 minutes.  Than half of this was in discussion and counseling    Discharge Instructions     Take the antibiotic 4 times a day starting tomorrow Return in 48 hours for a re check We have given you a shot for pain and a shot for infection  GO TO ER if you have worsening rash, pain or fever    ED Prescriptions    Medication Sig Dispense Auth. Provider   cephALEXin (KEFLEX) 500 MG capsule Take 1 capsule (500 mg total) by mouth 4 (four) times daily. 40 capsule Raylene Everts, MD     Controlled Substance Prescriptions [place] Controlled Substance Registry consulted? Not Applicable   Raylene Everts, MD 11/08/17 2033

## 2017-11-08 NOTE — Progress Notes (Deleted)
Subjective:    Patient ID: Diane Gilbert, female    DOB: [birth year], 52 y.o.   MRN: [id number]  Former patient of Dr. Gwenette Greet with respiratory bronchiolitis interstitial lung disease Dr. Gwenette Greet summarized her clinical situation as follows: HRCT 12/2013:  Mild GG, centrilobular nodularity PFTs 2015:  FEV1 1.48 (56%), but normal ratio.  No BD response.  TLC 60%, mild airtrapping.  DLCO 45%, but corrects to normal with Av Bronch 03/2014:  BAL with 85 WBC's, 21% eos.  TBBX with normal lung parenchyma, but not enough for possible specific diagnosis HP panel negative 03/2014 Pred trial 04/16/2014 >> unclear if any better.  VATS BX 06/2014:  RB-ILD (severe), ??? Component NSIP included in the differential Autoimmune labs normal 07/2014 March 2017 6 minute walk distance 144 m, O2 saturation nadir 81% on room air March 2017 pulmonary function testing ratio 85%, FVC 1.61 L (50% predicted) sees, FEV1 1.37 L (53% predicted), total lung capacity 3.14 L (58% predicted), DLCO 13.3 (49% predicted).  HPI No chief complaint on file.  ***  Past Medical History:  Diagnosis Date  . Alcohol abuse   . Anxiety   . Arthritis   . CHF (congestive heart failure) ([place])   . Depression   . Headache(784.0)   . Hypertension   . Insomnia   . Interstitial lung disease ([place])   . Oxygen dependent   . Schizophrenia ([place])   . Shortness of breath dyspnea   . Sleep apnea   . Sleep apnea       Review of Systems  Constitutional: Negative for chills, fatigue and fever.  HENT: Negative for hearing loss, postnasal drip and rhinorrhea.   Respiratory: Positive for cough and shortness of breath. Negative for wheezing.   Cardiovascular: Negative for chest pain, palpitations and leg swelling.       Objective:   Physical Exam There were no vitals filed for this visit. 2 L [place]  ***  ***multiple visits with TP  Last PFTs reviewed  CBC    Component Value Date/Time   WBC 9.9 08/13/2017 0339   RBC 6.78  (H) 08/13/2017 0339   HGB 13.8 08/13/2017 0339   HCT 50.2 (H) 08/13/2017 0339   PLT 318 08/13/2017 0339   MCV 74.0 (L) 08/13/2017 0339   MCH 20.4 (L) 08/13/2017 0339   MCHC 27.5 (L) 08/13/2017 0339   RDW 24.1 (H) 08/13/2017 0339   LYMPHSABS 3.3 08/09/2017 1713   MONOABS 1.0 08/09/2017 1713   EOSABS 0.1 08/09/2017 1713   BASOSABS 0.0 08/09/2017 1713          Assessment & Plan:   No diagnosis found.  Discussion: ***    Current Outpatient Medications:  .  albuterol (PROVENTIL HFA;VENTOLIN HFA) 108 (90 BASE) MCG/ACT inhaler, Inhale 2 puffs into the lungs every 6 (six) hours as needed for wheezing or shortness of breath., Disp: 1 Inhaler, Rfl: 3 .  amLODipine (NORVASC) 5 MG tablet, Take 5 mg daily by mouth., Disp: , Rfl:  .  budesonide-formoterol (SYMBICORT) 160-4.5 MCG/ACT inhaler, Inhale 2 puffs into the lungs 2 (two) times daily. (Patient not taking: Reported on 08/11/2017), Disp: 1 Inhaler, Rfl: 5 .  furosemide (LASIX) 40 MG tablet, Take 1 tablet (40 mg total) by mouth 2 (two) times daily. For 5 days, after that take 40 mg daily, Disp: 30 tablet, Rfl: 0 .  gabapentin (NEURONTIN) 100 MG capsule, Take 1 capsule (100 mg total) by mouth 3 (three) times daily., Disp: 90 capsule, Rfl: 0 .  Misc. Devices MISC, CPAP @@ Night, Disp: , Rfl:  .  nicotine (NICODERM CQ - DOSED IN MG/24 HOURS) 14 mg/24hr patch, Place 1 patch (14 mg total) daily onto the skin. (Patient not taking: Reported on 08/11/2017), Disp: 28 patch, Rfl: 0 .  OXYGEN, Inhale 2 L into the lungs continuous., Disp: , Rfl:  .  predniSONE (DELTASONE) 10 MG tablet, Take 29m daily for 3days,Take 357mdaily for 3days,Take 2050maily for 3days,Take 75m84mily for 3days, then stop, Disp: 30 tablet, Rfl: 0 .  QUEtiapine (SEROQUEL) 300 MG tablet, Take 1 tablet (300 mg total) by mouth at bedtime., Disp: 30 tablet, Rfl: 1

## 2017-11-08 NOTE — ED Triage Notes (Signed)
The patient presented to the [facility] with a complaint of a possible abscess on both sides of the inside of her thighs x 1 month.

## 2017-11-08 NOTE — Discharge Instructions (Addendum)
Take the antibiotic 4 times a day starting tomorrow Return in 48 hours for a re check We have given you a shot for pain and a shot for infection  GO TO ER if you have worsening rash, pain or fever

## 2017-11-10 ENCOUNTER — Ambulatory Visit (INDEPENDENT_AMBULATORY_CARE_PROVIDER_SITE_OTHER): Payer: Medicaid Other | Admitting: Pulmonary Disease

## 2017-11-10 ENCOUNTER — Encounter: Payer: Self-pay | Admitting: Pulmonary Disease

## 2017-11-10 VITALS — BP 132/86 | HR 94 | Ht 64.0 in | Wt 215.0 lb

## 2017-11-10 DIAGNOSIS — G4733 Obstructive sleep apnea (adult) (pediatric): Secondary | ICD-10-CM

## 2017-11-10 DIAGNOSIS — I5033 Acute on chronic diastolic (congestive) heart failure: Secondary | ICD-10-CM

## 2017-11-10 DIAGNOSIS — J849 Interstitial pulmonary disease, unspecified: Secondary | ICD-10-CM

## 2017-11-10 DIAGNOSIS — J9611 Chronic respiratory failure with hypoxia: Secondary | ICD-10-CM

## 2017-11-10 DIAGNOSIS — R0602 Shortness of breath: Secondary | ICD-10-CM | POA: Diagnosis not present

## 2017-11-10 DIAGNOSIS — J84115 Respiratory bronchiolitis interstitial lung disease: Secondary | ICD-10-CM

## 2017-11-10 DIAGNOSIS — J9621 Acute and chronic respiratory failure with hypoxia: Secondary | ICD-10-CM | POA: Diagnosis not present

## 2017-11-10 MED ORDER — BUDESONIDE-FORMOTEROL FUMARATE 160-4.5 MCG/ACT IN AERO: 2.0000 | INHALATION_SPRAY | Freq: Two times a day (BID) | RESPIRATORY_TRACT | 5 refills | Status: DC

## 2017-11-10 MED ORDER — FUROSEMIDE 40 MG PO TABS: 40.0000 mg | ORAL_TABLET | Freq: Two times a day (BID) | ORAL | 2 refills | Status: DC

## 2017-11-10 NOTE — Progress Notes (Signed)
Subjective:    Patient ID: Diane Gilbert, female    DOB: 1965/11/11, 52 y.o.   MRN: [id number]  Former patient of Dr. Gwenette Greet with respiratory bronchiolitis interstitial lung disease, chronic diastolic heart failure, obstructive sleep apnea with CPAP noncompliance, and ongoing tobacco use.  Baseline weight is around 200 pounds when euvolemic.  Baseline O2 saturation 2 L/min.  Still smoking as of May 2019  HPI Chief Complaint  Patient presents with  . Follow-up    ROV, reports cough when hot or smoking, increased SOB, uses 4L O2   Diane Gilbert says that she has been struggling with cellulitis and she had a ceftriaxone and some antibiotics for this two days ago.  She thinks it's getting better.  She says her breathing is "funny acting".  She gets dyspnea when she is sitting down or laying down.  She is swelling in her legs.  She has some dyspnea when she walks, but not as bad as when she is lying down.  She has coughed up some clear and thick mucus lately.  She did have fever and chills about two weeks ago, none since then.  She is still smoking off and on.  Typically she smoked 10 cigarettes a day.  She has tried to cut back.   Not taking Symbicort.  She says that lasix 35m doesn't make her urinate.     Past Medical History:  Diagnosis Date  . Alcohol abuse   . Anxiety   . Arthritis   . CHF (congestive heart failure) ([place]   . Depression   . Headache(784.0)   . Hypertension   . Insomnia   . Interstitial lung disease ([place]   . Oxygen dependent   . Schizophrenia ([place]   . Shortness of breath dyspnea   . Sleep apnea   . Sleep apnea       Review of Systems  Constitutional: Negative for chills, fatigue and fever.  HENT: Negative for hearing loss, postnasal drip and rhinorrhea.   Respiratory: Positive for cough and shortness of breath. Negative for wheezing.   Cardiovascular: Negative for chest pain, palpitations and leg swelling.       Objective:   Physical  Exam Vitals:   11/10/17 0907  BP: 132/86  Pulse: 94  SpO2: 91%  Weight: 215 lb (97.5 kg)  Height: _0  (1.626 m)   2 L   Gen: chronically ill appearing HENT: OP clear, TM's clear, neck supple PULM: Crackles bases B, normal percussion CV: RRR, no mgr, trace edema GI: BS+, soft, nontender Derm: no cyanosis or rash Psyche: normal mood and affect   Last PFTs reviewed  CBC    Component Value Date/Time   WBC 8.9 11/08/2017 1444   RBC 6.58 (H) 11/08/2017 1444   HGB 17.3 (H) 11/08/2017 1528   HCT 51.0 (H) 11/08/2017 1528   PLT 306 11/08/2017 1444   MCV 71.4 (L) 11/08/2017 1444   MCH 20.4 (L) 11/08/2017 1444   MCHC 28.5 (L) 11/08/2017 1444   RDW 22.7 (H) 11/08/2017 1444   LYMPHSABS 3.3 08/09/2017 1713   MONOABS 1.0 08/09/2017 1713   EOSABS 0.1 08/09/2017 1713   BASOSABS 0.0 08/09/2017 1713   Imaging: HRCT 12/2013:  Mild GG, centrilobular nodularity Chest x-ray February 2019 images independently reviewed showing cardiomegaly, fine groundglass bilaterally  PFT PFTs 2015:  FEV1 1.48 (56%), but normal ratio.  No BD response.  TLC 60%, mild airtrapping.  DLCO 45%, but corrects to normal with Av March 2017 pulmonary function testing  ratio 85%, FVC 1.61 L (50% predicted) sees, FEV1 1.37 L (53% predicted), total lung capacity 3.14 L (58% predicted), DLCO 13.3 (49% predicted).  Path: Bronch 03/2014:  BAL with 85 WBC's, 21% eos.  TBBX with normal lung parenchyma, but not enough for possible specific diagnosis  Pred trial 04/16/2014 >> unclear if any better.  VATS BX 06/2014:  RB-ILD (severe), ??? 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  Records from a visit with the urgent care clinic a couple days ago reviewed where she was treated for cellulitis    Assessment & Plan:   No diagnosis found.  Discussion: Diane Gilbert seems to be doing poorly recently.  She is volume overloaded on exam and by weight she is up by at least 10 pounds compared to where she should be.  She is still smoking cigarettes.  I think the cause of her worsening hypoxemia recently is both volume overload and likely worsening RB ILD in the setting of ongoing tobacco use.  We have discussed this many times.  This is a very difficult case because of her underlying mental illness and inability to quit smoking cigarettes.  Plan: RB ILD: This is the name of the lung disease which you have which is solely due to the fact that she continue to smoke cigarettes Quit smoking We will refill Symbicort as this seems to have helped you in the past Use albuterol as needed for chest tightness wheezing or shortness of breath  Chronic respiratory failure with hypoxemia: Keep using 4 L of oxygen continuously  OSA: Use oxygen at night Consider trying CPAP again at some point this year  Acute on chronic diastolic heart failure: You are holding onto more fluid today, I think you are at least 10 pounds over where you should be. Weigh yourself every day Limit fluid intake to 1/2 gallon a day Increase Lasix to 40 mg twice a day  Recent cellulitis: Continue taking the antibiotics as directed by the urgent care clinic, keep a follow-up appointment with them  Tobacco abuse: Stop smoking  Follow-up with a nurse practitioner in 3 to 4 weeks, PFT that visit to assess your RB ILD    Current Outpatient Medications:  .  albuterol (PROVENTIL HFA;VENTOLIN HFA) 108 (90 BASE) MCG/ACT inhaler, Inhale 2 puffs into the  lungs every 6 (six) hours as needed for wheezing or shortness of breath., Disp: 1 Inhaler, Rfl: 3 .  amLODipine (NORVASC) 5 MG tablet, Take 5 mg daily by mouth., Disp: , Rfl:  .  budesonide-formoterol (SYMBICORT) 160-4.5 MCG/ACT inhaler, Inhale 2 puffs into the lungs 2 (two) times daily., Disp: 1 Inhaler, Rfl: 5 .  cephALEXin (KEFLEX) 500 MG capsule, Take 1 capsule (500 mg total) by mouth 4 (four) times daily., Disp: 40 capsule, Rfl: 0 .  furosemide (LASIX) 40 MG tablet, Take 1 tablet (40 mg total) by mouth 2 (two) times daily. For 5 days, after that take 40 mg daily, Disp: 30 tablet, Rfl: 0 .  gabapentin (NEURONTIN) 100 MG  capsule, Take 1 capsule (100 mg total) by mouth 3 (three) times daily., Disp: 90 capsule, Rfl: 0 .  Misc. Devices MISC, CPAP @@ Night, Disp: , Rfl:  .  nicotine (NICODERM CQ - DOSED IN MG/24 HOURS) 14 mg/24hr patch, Place 1 patch (14 mg total) daily onto the skin., Disp: 28 patch, Rfl: 0 .  OXYGEN, Inhale 2 L into the lungs continuous., Disp: , Rfl:  .  predniSONE (DELTASONE) 10 MG tablet, Take 65m daily for 3days,Take 323mdaily for 3days,Take 204maily for 3days,Take 31m42mily for 3days, then stop, Disp: 30 tablet, Rfl: 0 .  QUEtiapine (SEROQUEL) 300 MG tablet, Take 1 tablet (300 mg total) by mouth at bedtime., Disp: 30 tablet, Rfl: 1

## 2017-11-10 NOTE — Patient Instructions (Signed)
RB ILD: This is the name of the lung disease which you have which is solely due to the fact that she continue to smoke cigarettes Quit smoking We will refill Symbicort as this seems to have helped you in the past Use albuterol as needed for chest tightness wheezing or shortness of breath  Chronic respiratory failure with hypoxemia: Keep using 4 L of oxygen continuously  Acute on chronic diastolic heart failure: You are holding onto more fluid today, I think you are at least 10 pounds over where you should be. Weigh yourself every day Limit fluid intake to 1/2 gallon a day Increase Lasix to 40 mg twice a day  Recent cellulitis: Continue taking the antibiotics as directed by the urgent care clinic, keep a follow-up appointment with them  Tobacco abuse: Stop smoking  Follow-up with a nurse practitioner in 3 to 4 weeks, PFT that visit to assess your RB ILD

## 2017-12-09 ENCOUNTER — Ambulatory Visit
Admission: RE | Admit: 2017-12-09 | Discharge: 2017-12-09 | Disposition: A | Discharge status: Home / Self Care | Payer: Medicaid Other | Source: Ambulatory Visit | Attending: Internal Medicine | Admitting: Internal Medicine

## 2017-12-09 DIAGNOSIS — Z1231 Encounter for screening mammogram for malignant neoplasm of breast: Secondary | ICD-10-CM

## 2017-12-15 ENCOUNTER — Encounter: Payer: Self-pay | Admitting: Acute Care

## 2017-12-15 ENCOUNTER — Ambulatory Visit: Payer: Self-pay | Admitting: Acute Care

## 2017-12-15 NOTE — Progress Notes (Deleted)
History of Present Illness Diane Gilbert is a 52 y.o. female current every day smoker with bronchiolitis interstitial lung disease, chronic diastolic heart failure, obstructive sleep apnea with CPAP noncompliance, and ongoing tobacco use. She is followed by Dr. Lake Bells.  Synopsis: Former patient of Dr. Gwenette Greet with respiratory bronchiolitis interstitial lung disease, chronic diastolic heart failure, obstructive sleep apnea with CPAP noncompliance, and ongoing tobacco use.  Baseline weight is around 200 pounds when euvolemic.  Baseline O2 saturation 2 L/min.  Still smoking as of June 2019    12/15/2017 Follow Up: Pt was seen by Dr. Lake Bells 11/10/2017. She had been  doing poorly .  She was volume overloaded on exam and by weight she was up by at least 10 pounds compared to baseline.  She continues  smoking cigarettes. Dr. Lake Bells felt  the cause of her worsening hypoxemia recently was both volume overload and likely worsening RB ILD in the setting of ongoing tobacco use.  She has had counseling re tobacco abuse many times..  This is a very difficult case because of her underlying mental illness and inability to quit smoking cigarettes.  Plan after the 5/25 OV :  for her RB ILD was to stop smoking, restart Symbicort, and use albuterol prn;For chronic respiratory failure, 4 L oxygen continuously; for OSA, work hard on using CPAP; for acute on chronic DHF, Weigh yourself every day , Limit fluid intake to 1/2 gallon a day ,Increase Lasix to 40 mg twice a day; for cellulitis, continue antibiotics prescribed by urgent care, and to stop smoking. PFT's were ordered to evaluate progression of her ILD.  Pt. Presents for follow up. She states she has been compliant with her lasix 2 times a day. Need to reduce lasix.Marland Kitchenrefer to cards for standing orders     Test Results: Imaging: HRCT 12/2013:  Mild GG, centrilobular nodularity Chest x-ray February 2019 images independently reviewed showing  cardiomegaly, fine groundglass bilaterally  PFT PFTs 2015:  FEV1 1.48 (56%), but normal ratio.  No BD response.  TLC 60%, mild airtrapping.  DLCO 45%, but corrects to normal with Av March 2017 pulmonary function testing ratio 85%, FVC 1.61 L (50% predicted) sees, FEV1 1.37 L (53% predicted), total lung capacity 3.14 L (58% predicted), DLCO 13.3 (49% predicted).  Path: Bronch 03/2014:  BAL with 85 WBC's, 21% eos.  TBBX with normal lung parenchyma, but not enough for possible specific diagnosis  Pred trial 04/16/2014 >> unclear if any better.  VATS BX 06/2014:  RB-ILD (severe), ??? Component NSIP included in the differential  Labs: HP panel negative 03/2014 Autoimmune labs normal 07/2014  Polysomnogram: 2015 AHI 10, O2 saturation less than 88% for 44 minutes  6 Min walk March 2017 6 minute walk distance 144 m, O2 saturation nadir 81% on room air   Records from May February 2019 hospitalization reviewed where she was treated for acute on chronic respiratory failure with hypoxemia presumably due to noncompliance with CPAP, active smoking, a flare of her known RB ILD, and acute on chronic diastolic heart failure.  Records from a visit with the urgent care clinic a couple days ago reviewed where she was treated for cellulitis      CBC Latest Ref Rng & Units 11/08/2017 11/08/2017 08/13/2017  WBC 4.0 - 10.5 K/uL - 8.9 9.9  Hemoglobin 12.0 - 15.0 g/dL 17.3(H) 13.4 13.8  Hematocrit 36.0 - 46.0 % 51.0(H) 47.0(H) 50.2(H)  Platelets 150 - 400 K/uL - 306 318    BMP Latest Ref Rng &  Units 11/08/2017 08/13/2017 08/12/2017  Glucose 65 - 99 mg/dL 97 167(H) 166(H)  BUN 6 - 20 mg/dL _0 Creatinine 0.44 - 1.00 mg/dL 0.80 0.60 0.65  Sodium 135 - 145 mmol/L 141 138 139  Potassium 3.5 - 5.1 mmol/L 4.2 3.6 3.2(L)  Chloride 101 - 111 mmol/L 102 95(L) 92(L)  CO2 22 - 32 mmol/L - 31 34(H)  Calcium 8.9 - 10.3 mg/dL - 8.4(L) 8.6(L)    BNP    Component Value Date/Time   BNP 405.5 (H)  08/12/2017 0339    ProBNP    Component Value Date/Time   PROBNP 12.0 09/06/2015 1404    PFT    Component Value Date/Time   FEV1PRE 1.25 11/04/2016 1340   FEV1POST 1.18 11/04/2016 1340   FVCPRE 1.48 11/04/2016 1340   FVCPOST 1.36 11/04/2016 1340   TLC 3.02 11/04/2016 1340   DLCOUNC 12.65 11/04/2016 1340   PREFEV1FVCRT 84 11/04/2016 1340   PSTFEV1FVCRT 87 11/04/2016 1340    Mm 3d Screen Breast Bilateral  Result Date: 12/09/2017 CLINICAL DATA:  Screening. EXAM: DIGITAL SCREENING BILATERAL MAMMOGRAM WITH TOMO AND CAD COMPARISON:  Previous exam(s). ACR Breast Density Category d: The breast tissue is extremely dense, which lowers the sensitivity of mammography. FINDINGS: There are no findings suspicious for malignancy. Images were processed with CAD. IMPRESSION: No mammographic evidence of malignancy. A result letter of this screening mammogram will be mailed directly to the patient. RECOMMENDATION: Screening mammogram in one year. (Code:[id number]) BI-RADS CATEGORY  1: Negative. Electronically Signed   By: Lajean Manes M.D.   On: 12/09/2017 15:52     Past medical hx Past Medical History:  Diagnosis Date  . Alcohol abuse   . Anxiety   . Arthritis   . CHF (congestive heart failure) ([place])   . Depression   . Headache(784.0)   . Hypertension   . Insomnia   . Interstitial lung disease ([place])   . Oxygen dependent   . Schizophrenia ([place])   . Shortness of breath dyspnea   . Sleep apnea   . Sleep apnea      Social History   Tobacco Use  . Smoking status: Current Some Day Smoker    Packs/day: 1.50    Years: 30.00    Pack years: 45.00    Types: Cigarettes  . Smokeless tobacco: Never Used  . Tobacco comment: smoking 3 cigarettes daily "every now and then" 11/04/16  Substance Use Topics  . Alcohol use: No    Alcohol/week: 0.0 oz    Frequency: Never    Comment: Pt has previously abused alcohol , pt states she still drinks "every now and then"  . Drug use: Yes    Types: Cocaine      Ms.Bardwell reports that she has been smoking cigarettes.  She has a 45.00 pack-year smoking history. She has never used smokeless tobacco. She reports that she has current or past drug history. Drug: Cocaine. She reports that she does not drink alcohol.  Tobacco Cessation: Ready to quit: Not Answered Counseling given: Not Answered Comment: smoking 3 cigarettes daily "every now and then" 11/04/16   Past surgical hx, Family hx, Social hx all reviewed.  Current Outpatient Medications on File Prior to Visit  Medication Sig  . albuterol (PROVENTIL HFA;VENTOLIN HFA) 108 (90 BASE) MCG/ACT inhaler Inhale 2 puffs into the lungs every 6 (six) hours as needed for wheezing or shortness of breath.  Marland Kitchen amLODipine (NORVASC) 5 MG tablet Take 5 mg daily by mouth.  Marland Kitchen  budesonide-formoterol (SYMBICORT) 160-4.5 MCG/ACT inhaler Inhale 2 puffs into the lungs 2 (two) times daily.  . cephALEXin (KEFLEX) 500 MG capsule Take 1 capsule (500 mg total) by mouth 4 (four) times daily.  . furosemide (LASIX) 40 MG tablet Take 1 tablet (40 mg total) by mouth 2 (two) times daily. For 5 days, after that take 40 mg daily  . furosemide (LASIX) 40 MG tablet Take 1 tablet (40 mg total) by mouth 2 (two) times daily.  Marland Kitchen gabapentin (NEURONTIN) 100 MG capsule Take 1 capsule (100 mg total) by mouth 3 (three) times daily.  . Misc. Devices MISC CPAP @@ Night  . nicotine (NICODERM CQ - DOSED IN MG/24 HOURS) 14 mg/24hr patch Place 1 patch (14 mg total) daily onto the skin.  . OXYGEN Inhale 2 L into the lungs continuous.  . predniSONE (DELTASONE) 10 MG tablet Take 74m daily for 3days,Take 367mdaily for 3days,Take 2032maily for 3days,Take 77m35mily for 3days, then stop  . QUEtiapine (SEROQUEL) 300 MG tablet Take 1 tablet (300 mg total) by mouth at bedtime.   No current facility-administered medications on file prior to visit.      No Known Allergies  Review Of Systems:  Constitutional:   No  weight loss, night sweats,   Fevers, chills, fatigue, or  lassitude.  HEENT:   No headaches,  Difficulty swallowing,  Tooth/dental problems, or  Sore throat,                No sneezing, itching, ear ache, nasal congestion, post nasal drip,   CV:  No chest pain,  Orthopnea, PND, swelling in lower extremities, anasarca, dizziness, palpitations, syncope.   GI  No heartburn, indigestion, abdominal pain, nausea, vomiting, diarrhea, change in bowel habits, loss of appetite, bloody stools.   Resp: No shortness of breath with exertion or at rest.  No excess mucus, no productive cough,  No non-productive cough,  No coughing up of blood.  No change in color of mucus.  No wheezing.  No chest wall deformity  Skin: no rash or lesions.  GU: no dysuria, change in color of urine, no urgency or frequency.  No flank pain, no hematuria   MS:  No joint pain or swelling.  No decreased range of motion.  No back pain.  Psych:  No change in mood or affect. No depression or anxiety.  No memory loss.   Vital Signs LMP 04/06/2017    Physical Exam:  General- No distress,  A&Ox3 ENT: No sinus tenderness, TM clear, pale nasal mucosa, no oral exudate,no post nasal drip, no LAN Cardiac: S1, S2, regular rate and rhythm, no murmur Chest: No wheeze/ rales/ dullness; no accessory muscle use, no nasal flaring, no sternal retractions Abd.: Soft Non-tender Ext: No clubbing cyanosis, edema Neuro:  normal strength Skin: No rashes, warm and dry Psych: normal mood and behavior   Assessment/Plan  No problem-specific Assessment & Plan notes found for this encounter.    SaraMagdalen Spatz 12/15/2017  9:37 AM

## 2018-01-03 NOTE — H&P (Signed)
OFFICE VISIT NOTES COPIED TO EPIC FOR DOCUMENTATION  . History of Present Illness Diane Page MD; 12/11/2017 11:04 AM) Patient words: NP EVAL for chf.  The patient is a 52 year old female who presents with congestive heart failure. [name] is a African-American female with severe COPD and oxygen dependent, hypertension, respiratory bronchiolitis with interstitial lung disease, chronic diastolic heart failure, obstructive sleep apnea noncompliant with CPAP, ongoing tobacco use disorder, referred to me for evaluation of congestive heart failure she also has anxiety and depression. She was also noted to be Cocaine positive in Feb 2019 hospital admit. She has chronic worsening dyspnea. She denies any leg edema, has chronic orthopnea. No chest pain or palpitations. No syncope echocardiogram in January 2019 underwent severe pulmonary hypertension with dilated right ventricle.   Problem List/Past Medical Diane Gilbert; 2017-12-16 11:22 AM) Laboratory examination (Z01.89)  CHF (congestive heart failure) (I50.9)  Benign essential hypertension (I10)  Oxygen dependent (Z99.81)  Anxiety and depression (F41.9, F32.9)   Allergies Diane Gilbert; Dec 16, 2017 11:17 AM) No Known Drug Allergies [12/16/17]:  Family History Diane Gilbert; 12/16/2017 11:26 AM) Mother  In poor health. oxygeyn dependent; No heart issues,BP, possible cancer Father  unsure of father Sister 2  In good health. younger; no heart issues Brother 4  In good health. 1 passed; rest good health; no heart issues  Social History Diane Gilbert; 2017/12/16 11:24 AM) Current tobacco use  Current every day smoker. 6-7 cigarettes daily ; started smoking 1985 Alcohol Use  Occasional alcohol use. Marital status  Single. Living Situation  Lives alone. Number of Children  0.  Past Surgical History Diane Gilbert; 12/16/17 11:27 AM) Back Surgery [2014]: Lung biopsy [2017]:  Medication History  Diane Page, MD; 12/11/2017 11:09 AM) tiZANidine HCl (4MG Capsule, 1 Oral as needed) Active. Furosemide (40MG Tablet, 1 Oral daily) Active. Aspirin (81MG Capsule, 1 Oral daily) Active. Albuterol (90MCG/ACT Aerosol Soln, Inhalation as needed) Active. traZODone HCl (100MG Tablet, 1 Oral daily) Active. amLODIPine Besylate (5MG Tablet, 1 Oral daily) Active. Voltaren (1% Gel, Transdermal as needed) Active. SEROquel (200MG Tablet, 1 Oral daily) Active. Wellbutrin SR (150MG Tablet ER 12HR, 1 Oral daily) Active. Gabapentin (100MG Capsule, 1 Oral three times daily) Active. Abilify (5MG Tablet, 1 Oral daily) Active. Qvar RediHaler (80MCG/ACT Aero Breath Act, 2 puffs Inhalation two times daily) Active. Medications Reconciled (meds and list present)  Diagnostic Studies History Diane Gilbert; 12/16/2017 11:28 AM) Mammogram [12/09/2017]:    Review of Systems Diane Page MD; 12/11/2017 11:09 AM) General Not Present- Appetite Loss and Weight Gain. Respiratory Present- Difficulty Breathing on Exertion. Not Present- Chronic Cough and Wakes up from Sleep Wheezing or Short of Breath. Gastrointestinal Not Present- Black, Tarry Stool and Difficulty Swallowing. Musculoskeletal Present- Joint Pain. Not Present- Decreased Range of Motion and Muscle Atrophy. Neurological Present- Dysesthesia (feet). Not Present- Attention Deficit. Psychiatric Not Present- Personality Changes and Suicidal Ideation. Endocrine Not Present- Cold Intolerance and Heat Intolerance. Hematology Not Present- Abnormal Bleeding. All other systems negative  Vitals Diane Gilbert; 12/16/2017 11:23 AM) 12/16/17 11:12 AM Weight: 184.31 lb Height: 64in Body Surface Area: 1.89 m Body Mass Index: 31.64 kg/m  Pulse: 95 (Regular)  P.OX: 89% (2L O2) BP: 162/98 (Sitting, Left Arm, Standard)       Physical Exam Diane Page MD; 12/11/2017 11:09 AM) General Mental Status-Alert. General  Appearance-Cooperative, Appears older than stated age and Sickly. Build & Nutrition-Moderately built and Mildly obese.  Head and Neck Thyroid Gland Characteristics - normal size and  consistency and no palpable nodules.  Chest and Lung Exam Chest and lung exam reveals -quiet, even and easy respiratory effort with no use of accessory muscles and non-tender. Auscultation Adventitious sounds - Inspiratory & expiratory crackles - Both Lung Fields(faint diffuse).  Cardiovascular Cardiovascular examination reveals -carotid auscultation reveals no bruits, abdominal aorta auscultation reveals no bruits and no prominent pulsation, femoral artery auscultation bilaterally reveals normal pulses, no bruits, no thrills, normal pedal pulses bilaterally and no digital clubbing, cyanosis, edema, increased warmth or tenderness. Auscultation Rhythm - Regular. Heart Sounds - S1 WNL and S2 Increased intensity. Murmurs & Other Heart Sounds - Auscultation of the heart reveals - No Murmurs.  Abdomen Palpation/Percussion Palpation and Percussion of the abdomen reveal - Non Tender and No hepatosplenomegaly.  Neurologic Neurologic evaluation reveals -alert and oriented x 3 with no impairment of recent or remote memory. Motor-Grossly intact without any focal deficits.  Musculoskeletal Global Assessment Left Lower Extremity - no deformities, masses or tenderness, no known fractures. Right Lower Extremity - no deformities, masses or tenderness, no known fractures.    Assessment & Plan Diane Page MD; 12/11/2017 11:11 AM) Benign essential hypertension (I10) Story: EKG 12/10/2017: Sinus rhythm at 91 bpm. Left axis deviation. Normal conduction. Old anteroseptal and inferolateral infarct. Poor R-wave progression. Biatrial enlargement Current Plans Complete electrocardiogram (93000) Started Lisinopril-hydroCHLOROthiazide 20-25MG, 1 (one) Tablet every morning, #30, 30 days starting 12/10/2017,  Ref. x1. Future Plans 12/31/8887: METABOLIC PANEL, BASIC (16945) - one time 12/22/2017: CBC & PLATELETS (AUTO) (03888) - one time Pulmonary hypertension (I27.20) Story: Echocardiogram 07/09/2017: Normal LV systolic function, EF 28-00%, mildly thickened mitral valve leaflets. Moderately dilated right ventricle, severe pulmonary hypertension with mild TR. ILD (interstitial lung disease) (J84.9) Story: Chest x-ray PA and lateral view 08/09/2017: Mild chronic groundglass appearance in the lungs bilaterally consistent with chronic known ILD: Follows Dr. Roselie Awkward Hyperlipidemia, group A (E78.00) Current Plans Started Rosuvastatin Calcium 10MG, 1 (one) Tablet daily, #30, 30 days starting 12/10/2017, Ref. x2. Laboratory examination (L49.17) Story: Labs 12/28/2017: Creatinine 1.29, EGFR 56, potassium 4.9, calcium 10.9, BMP otherwise normal.  WBC 11.1, RBC 8.17, hemoglobin 18.5, hematocrit 58, microcytic indices, CBC otherwise normal.   07/06/2017: BUN 15, creatinine 0.84, eGFR greater than 60 ML, potassium 4.6. Total cholesterol 211, triglycerides 156, HDL 36, LDL 136. HB 15.8/HCT 49.8, platelets 264, normal indicis TSH normal.  Urine analysis 08/09/2017: Cocaine positive.  Note:-  Recommendations:  Diane Gilbert is a chronically ill-appearing, looks older than stated age African-American female with severe COPD and oxygen dependent, hypertension, anxiety and depression, respiratory bronchiolitis with interstitial lung disease (follows Roselie Awkward, MD), chronic diastolic heart failure, obstructive sleep apnea noncompliant with CPAP, ongoing tobacco use disorder, referred to me for evaluation of congestive heart failure.  She was also noted to be Cocaine positive in Feb 2019 hospital admit. Echocardiogram in January 2019 underwent severe pulmonary hypertension with dilated right ventricle.  I have reviewed all her medical records possible from the hospital and also other consultants, I  have strictly warned her against high risk for mortality especially in view of RV systolic dysfunction and pulmonary hypertension with ongoing polysubstance abuse including nicotine and cocaine.  Started her on Crestor 10 mg daily. Her pressure is also uncontrolled, I have added lisinopril HCT 20/25 mg q.a.m. and will obtain a BMP in 10 days to 2 weeks. She will need right and left heart catheterization for definitive diagnosis of CAD and also pulmonary hypertension evaluation. I will see her back after the procedure.  Schedule for cardiac catheterization, and possible angioplasty. We discussed regarding risks, benefits, alternatives to this including stress testing, CTA and continued medical therapy. Patient wants to proceed. Understands <1-2% risk of death, stroke, MI, urgent CABG, bleeding, infection, renal failure but not limited to these. This was a greater than 60 minute office visit with greater than 50% of the time spent with face-to-face encounter with patient and evaluation of complex medical issues, review of external records and coordination of care.  CC Dr. Nolene Ebbs. (PCP); CC: Roselie Awkward, MD (Pul)    Signed by Diane Page, MD (12/11/2017 11:11 AM)

## 2018-01-04 ENCOUNTER — Observation Stay (HOSPITAL_COMMUNITY)
Admission: RE | Admit: 2018-01-04 | Discharge: 2018-01-04 | Disposition: A | Discharge status: Home / Self Care | Payer: Medicaid Other | Source: Ambulatory Visit | Attending: Cardiology | Admitting: Cardiology

## 2018-01-04 ENCOUNTER — Other Ambulatory Visit: Payer: Self-pay

## 2018-01-04 ENCOUNTER — Encounter (HOSPITAL_COMMUNITY)
Admission: RE | Disposition: A | Discharge status: Home / Self Care | Payer: Self-pay | Source: Ambulatory Visit | Attending: Cardiology

## 2018-01-04 ENCOUNTER — Encounter (HOSPITAL_COMMUNITY): Payer: Self-pay | Admitting: Cardiology

## 2018-01-04 DIAGNOSIS — Z79899 Other long term (current) drug therapy: Secondary | ICD-10-CM | POA: Insufficient documentation

## 2018-01-04 DIAGNOSIS — R0609 Other forms of dyspnea: Secondary | ICD-10-CM | POA: Diagnosis present

## 2018-01-04 DIAGNOSIS — Z9889 Other specified postprocedural states: Secondary | ICD-10-CM | POA: Insufficient documentation

## 2018-01-04 DIAGNOSIS — E7800 Pure hypercholesterolemia, unspecified: Secondary | ICD-10-CM | POA: Diagnosis not present

## 2018-01-04 DIAGNOSIS — R0902 Hypoxemia: Secondary | ICD-10-CM | POA: Insufficient documentation

## 2018-01-04 DIAGNOSIS — Z6831 Body mass index (BMI) 31.0-31.9, adult: Secondary | ICD-10-CM | POA: Diagnosis not present

## 2018-01-04 DIAGNOSIS — F17210 Nicotine dependence, cigarettes, uncomplicated: Secondary | ICD-10-CM | POA: Insufficient documentation

## 2018-01-04 DIAGNOSIS — Z9981 Dependence on supplemental oxygen: Secondary | ICD-10-CM | POA: Insufficient documentation

## 2018-01-04 DIAGNOSIS — I110 Hypertensive heart disease with heart failure: Secondary | ICD-10-CM | POA: Diagnosis not present

## 2018-01-04 DIAGNOSIS — Z7982 Long term (current) use of aspirin: Secondary | ICD-10-CM | POA: Insufficient documentation

## 2018-01-04 DIAGNOSIS — E669 Obesity, unspecified: Secondary | ICD-10-CM | POA: Insufficient documentation

## 2018-01-04 DIAGNOSIS — F329 Major depressive disorder, single episode, unspecified: Secondary | ICD-10-CM | POA: Diagnosis not present

## 2018-01-04 DIAGNOSIS — F419 Anxiety disorder, unspecified: Secondary | ICD-10-CM | POA: Diagnosis not present

## 2018-01-04 DIAGNOSIS — G4733 Obstructive sleep apnea (adult) (pediatric): Secondary | ICD-10-CM | POA: Diagnosis not present

## 2018-01-04 DIAGNOSIS — F1410 Cocaine abuse, uncomplicated: Secondary | ICD-10-CM | POA: Diagnosis not present

## 2018-01-04 DIAGNOSIS — I5032 Chronic diastolic (congestive) heart failure: Secondary | ICD-10-CM | POA: Insufficient documentation

## 2018-01-04 HISTORY — PX: RIGHT/LEFT HEART CATH AND CORONARY ANGIOGRAPHY: CATH118266

## 2018-01-04 LAB — POCT I-STAT 3, VENOUS BLOOD GAS (G3P V)
ACID-BASE EXCESS: 1 mmol/L (ref 0.0–2.0)
ACID-BASE EXCESS: 1 mmol/L (ref 0.0–2.0)
BICARBONATE: 28.2 mmol/L — AB (ref 20.0–28.0)
Bicarbonate: 27 mmol/L (ref 20.0–28.0)
O2 SAT: 70 %
O2 Saturation: 95 %
PCO2 VEN: 46.4 mmHg (ref 44.0–60.0)
TCO2: 28 mmol/L (ref 22–32)
TCO2: 30 mmol/L (ref 22–32)
pCO2, Ven: 53.5 mmHg (ref 44.0–60.0)
pH, Ven: 7.329 (ref 7.250–7.430)
pH, Ven: 7.373 (ref 7.250–7.430)
pO2, Ven: 40 mmHg (ref 32.0–45.0)
pO2, Ven: 76 mmHg — ABNORMAL HIGH (ref 32.0–45.0)

## 2018-01-04 SURGERY — RIGHT/LEFT HEART CATH AND CORONARY ANGIOGRAPHY
Anesthesia: LOCAL

## 2018-01-04 MED ORDER — MIDAZOLAM HCL 2 MG/2ML IJ SOLN
INTRAMUSCULAR | Status: DC | PRN
  Administered 2018-01-04: 2 mg via INTRAVENOUS

## 2018-01-04 MED ORDER — VERAPAMIL HCL 2.5 MG/ML IV SOLN
INTRAVENOUS | Status: AC
  Filled 2018-01-04: qty 2

## 2018-01-04 MED ORDER — LIDOCAINE HCL (PF) 1 % IJ SOLN
INTRAMUSCULAR | Status: AC
  Filled 2018-01-04: qty 30

## 2018-01-04 MED ORDER — MIDAZOLAM HCL 2 MG/2ML IJ SOLN
INTRAMUSCULAR | Status: AC
  Filled 2018-01-04: qty 2

## 2018-01-04 MED ORDER — ACETAMINOPHEN 325 MG PO TABS: 650.0000 mg | ORAL_TABLET | ORAL | Status: DC | PRN

## 2018-01-04 MED ORDER — SODIUM CHLORIDE 0.9 % IV SOLN: 250.0000 mL | INTRAVENOUS | Status: DC | PRN

## 2018-01-04 MED ORDER — VERAPAMIL HCL 2.5 MG/ML IV SOLN
INTRA_ARTERIAL | Status: DC | PRN
  Administered 2018-01-04: 10 mL via INTRA_ARTERIAL

## 2018-01-04 MED ORDER — HEPARIN (PORCINE) IN NACL 1000-0.9 UT/500ML-% IV SOLN
INTRAVENOUS | Status: DC | PRN
  Administered 2018-01-04: 500 mL

## 2018-01-04 MED ORDER — LIDOCAINE HCL (PF) 1 % IJ SOLN
INTRAMUSCULAR | Status: DC | PRN
  Administered 2018-01-04 (×2): 2 mL

## 2018-01-04 MED ORDER — DIPHENHYDRAMINE HCL 50 MG/ML IJ SOLN
INTRAMUSCULAR | Status: DC | PRN
  Administered 2018-01-04: 25 mg via INTRAVENOUS

## 2018-01-04 MED ORDER — ONDANSETRON HCL 4 MG/2ML IJ SOLN: 4.0000 mg | Freq: Four times a day (QID) | INTRAMUSCULAR | Status: DC | PRN

## 2018-01-04 MED ORDER — FAMOTIDINE IN NACL 20-0.9 MG/50ML-% IV SOLN
INTRAVENOUS | Status: AC | PRN
  Administered 2018-01-04: 20 mg via INTRAVENOUS

## 2018-01-04 MED ORDER — HEPARIN (PORCINE) IN NACL 1000-0.9 UT/500ML-% IV SOLN
INTRAVENOUS | Status: AC
  Filled 2018-01-04: qty 500

## 2018-01-04 MED ORDER — IOPAMIDOL (ISOVUE-370) INJECTION 76%
INTRAVENOUS | Status: AC
  Filled 2018-01-04: qty 100

## 2018-01-04 MED ORDER — SODIUM CHLORIDE 0.9% FLUSH: 3.0000 mL | Freq: Two times a day (BID) | INTRAVENOUS | Status: DC

## 2018-01-04 MED ORDER — HEPARIN SODIUM (PORCINE) 1000 UNIT/ML IJ SOLN
INTRAMUSCULAR | Status: DC | PRN
  Administered 2018-01-04: 4000 [IU] via INTRAVENOUS

## 2018-01-04 MED ORDER — ONDANSETRON HCL 4 MG/2ML IJ SOLN
INTRAMUSCULAR | Status: AC
  Filled 2018-01-04: qty 2

## 2018-01-04 MED ORDER — SODIUM CHLORIDE 0.9% FLUSH: 3.0000 mL | INTRAVENOUS | Status: DC | PRN

## 2018-01-04 MED ORDER — FAMOTIDINE IN NACL 20-0.9 MG/50ML-% IV SOLN
INTRAVENOUS | Status: AC
  Filled 2018-01-04: qty 50

## 2018-01-04 MED ORDER — ASPIRIN 81 MG PO CHEW
CHEWABLE_TABLET | ORAL | Status: AC
  Filled 2018-01-04: qty 1

## 2018-01-04 MED ORDER — SODIUM CHLORIDE 0.9 % IV SOLN
INTRAVENOUS | Status: DC
  Administered 2018-01-04: 10:00:00 via INTRAVENOUS

## 2018-01-04 MED ORDER — ASPIRIN 81 MG PO CHEW
81.0000 mg | CHEWABLE_TABLET | Freq: Once | ORAL | Status: AC
  Administered 2018-01-04: 81 mg via ORAL

## 2018-01-04 MED ORDER — IOPAMIDOL (ISOVUE-370) INJECTION 76%
INTRAVENOUS | Status: DC | PRN
  Administered 2018-01-04: 45 mL via INTRA_ARTERIAL

## 2018-01-04 MED ORDER — SODIUM CHLORIDE 0.9 % WEIGHT BASED INFUSION: 1.0000 mL/kg/h | INTRAVENOUS | Status: DC

## 2018-01-04 MED ORDER — NITROGLYCERIN 1 MG/10 ML FOR IR/CATH LAB
INTRA_ARTERIAL | Status: AC
  Filled 2018-01-04: qty 10

## 2018-01-04 MED ORDER — ONDANSETRON HCL 4 MG/2ML IJ SOLN
INTRAMUSCULAR | Status: DC | PRN
  Administered 2018-01-04: 4 mg via INTRAVENOUS

## 2018-01-04 MED ORDER — HEPARIN SODIUM (PORCINE) 1000 UNIT/ML IJ SOLN
INTRAMUSCULAR | Status: AC
  Filled 2018-01-04: qty 1

## 2018-01-04 MED ORDER — DIPHENHYDRAMINE HCL 50 MG/ML IJ SOLN
INTRAMUSCULAR | Status: AC
  Filled 2018-01-04: qty 1

## 2018-01-04 SURGICAL SUPPLY — 12 items
CATH BALLN WEDGE 5F 110CM (CATHETERS) ×2 IMPLANT
CATH OPTITORQUE TIG 4.0 5F (CATHETERS) ×2 IMPLANT
DEVICE RAD COMP TR BAND LRG (VASCULAR PRODUCTS) ×2 IMPLANT
GLIDESHEATH SLEND A-KIT 6F 20G (SHEATH) ×2 IMPLANT
GUIDEWIRE INQWIRE 1.5J.035X260 (WIRE) ×1 IMPLANT
INQWIRE 1.5J .035X260CM (WIRE) ×2
KIT HEART LEFT (KITS) ×2 IMPLANT
PACK CARDIAC CATHETERIZATION (CUSTOM PROCEDURE TRAY) ×2 IMPLANT
SHEATH GLIDE SLENDER 4/5FR (SHEATH) ×2 IMPLANT
TRANSDUCER W/STOPCOCK (MISCELLANEOUS) ×2 IMPLANT
TUBING CIL FLEX 10 FLL-RA (TUBING) ×2 IMPLANT
WIRE EMERALD 3MM-J .025X260CM (WIRE) ×2 IMPLANT

## 2018-01-04 NOTE — Interval H&P Note (Signed)
History and Physical Interval Note:  01/04/2018 10:49 AM  Diane Gilbert  has presented today for surgery, with the diagnosis of shortness of breath - hp  The various methods of treatment have been discussed with the patient and family. After consideration of risks, benefits and other options for treatment, the patient has consented to  Procedure(s): RIGHT/LEFT HEART CATH AND CORONARY ANGIOGRAPHY (N/A) and possible angioplasty  as a surgical intervention .  The patient's history has been reviewed, patient examined, no change in status, stable for surgery.  I have reviewed the patient's chart and labs.  Questions were answered to the patient's satisfaction.     Adrian Prows

## 2018-01-04 NOTE — Discharge Instructions (Signed)
Drink plenty of fluids over next 48 hours and keep right wrist elevated at heart level for 24 hours ° °Radial Site Care °Refer to this sheet in the next few weeks. These instructions provide you with information about caring for yourself after your procedure. Your health care provider may also give you more specific instructions. Your treatment has been planned according to current medical practices, but problems sometimes occur. Call your health care provider if you have any problems or questions after your procedure. °What can I expect after the procedure? °After your procedure, it is typical to have the following: °· Bruising at the radial site that usually fades within 1-2 weeks. °· Blood collecting in the tissue (hematoma) that may be painful to the touch. It should usually decrease in size and tenderness within 1-2 weeks. ° °Follow these instructions at home: °· Take medicines only as directed by your health care provider. °· You may shower 24-48 hours after the procedure or as directed by your health care provider. Remove the bandage (dressing) and gently wash the site with plain soap and water. Pat the area dry with a clean towel. Do not rub the site, because this may cause bleeding. °· Do not take baths, swim, or use a hot tub until your health care provider approves. °· Check your insertion site every day for redness, swelling, or drainage. °· Do not apply powder or lotion to the site. °· Do not flex or bend the affected arm for 24 hours or as directed by your health care provider. °· Do not push or pull heavy objects with the affected arm for 24 hours or as directed by your health care provider. °· Do not lift over 10 lb (4.5 kg) for 5 days after your procedure or as directed by your health care provider. °· Ask your health care provider when it is okay to: °? Return to work or school. °? Resume usual physical activities or sports. °? Resume sexual activity. °· Do not drive home if you are discharged the  same day as the procedure. Have someone else drive you. °· You may drive 24 hours after the procedure unless otherwise instructed by your health care provider. °· Do not operate machinery or power tools for 24 hours after the procedure. °· If your procedure was done as an outpatient procedure, which means that you went home the same day as your procedure, a responsible adult should be with you for the first 24 hours after you arrive home. °· Keep all follow-up visits as directed by your health care provider. This is important. °Contact a health care provider if: °· You have a fever. °· You have chills. °· You have increased bleeding from the radial site. Hold pressure on the site. °Get help right away if: °· You have unusual pain at the radial site. °· You have redness, warmth, or swelling at the radial site. °· You have drainage (other than a small amount of blood on the dressing) from the radial site. °· The radial site is bleeding, and the bleeding does not stop after 30 minutes of holding steady pressure on the site. °· Your arm or hand becomes pale, cool, tingly, or numb. °This information is not intended to replace advice given to you by your health care provider. Make sure you discuss any questions you have with your health care provider. °Document Released: 07/18/2010 Document Revised: 11/21/2015 Document Reviewed: 01/01/2014 °Elsevier Interactive Patient Education © 2018 [employer] ° °

## 2018-01-04 NOTE — Progress Notes (Signed)
Took over care from Jan Johnson in the holding area. Patient vitals stable, face red and mildly swollen from admitting picture. Throughout my care the redness and swelling have continued to resolve. Patient ambulated to restroom without assistance. Patient also states she was able to contact her neighbor who will be staying the night with her tonight. Notified Dr. Einar Gip, who will discontinue admitting orders. Patient transported to short stay at this time.

## 2018-01-05 LAB — POCT I-STAT 3, VENOUS BLOOD GAS (G3P V)
Acid-Base Excess: 2 mmol/L (ref 0.0–2.0)
BICARBONATE: 29.1 mmol/L — AB (ref 20.0–28.0)
O2 Saturation: 74 %
PCO2 VEN: 53 mmHg (ref 44.0–60.0)
PH VEN: 7.347 (ref 7.250–7.430)
TCO2: 31 mmol/L (ref 22–32)
pO2, Ven: 42 mmHg (ref 32.0–45.0)

## 2018-01-12 ENCOUNTER — Other Ambulatory Visit (INDEPENDENT_AMBULATORY_CARE_PROVIDER_SITE_OTHER): Payer: Medicaid Other

## 2018-01-12 ENCOUNTER — Ambulatory Visit (INDEPENDENT_AMBULATORY_CARE_PROVIDER_SITE_OTHER): Payer: Medicaid Other | Admitting: Acute Care

## 2018-01-12 ENCOUNTER — Telehealth: Payer: Self-pay | Admitting: Acute Care

## 2018-01-12 ENCOUNTER — Ambulatory Visit: Payer: Medicaid Other

## 2018-01-12 ENCOUNTER — Encounter: Payer: Self-pay | Admitting: Acute Care

## 2018-01-12 VITALS — BP 122/70 | HR 68 | Ht 64.0 in | Wt 187.0 lb

## 2018-01-12 DIAGNOSIS — F17200 Nicotine dependence, unspecified, uncomplicated: Secondary | ICD-10-CM | POA: Diagnosis not present

## 2018-01-12 DIAGNOSIS — E876 Hypokalemia: Secondary | ICD-10-CM

## 2018-01-12 DIAGNOSIS — J84115 Respiratory bronchiolitis interstitial lung disease: Secondary | ICD-10-CM

## 2018-01-12 DIAGNOSIS — G4733 Obstructive sleep apnea (adult) (pediatric): Secondary | ICD-10-CM | POA: Diagnosis not present

## 2018-01-12 DIAGNOSIS — I509 Heart failure, unspecified: Secondary | ICD-10-CM | POA: Diagnosis not present

## 2018-01-12 DIAGNOSIS — J9611 Chronic respiratory failure with hypoxia: Secondary | ICD-10-CM

## 2018-01-12 DIAGNOSIS — Z720 Tobacco use: Secondary | ICD-10-CM

## 2018-01-12 DIAGNOSIS — E871 Hypo-osmolality and hyponatremia: Secondary | ICD-10-CM

## 2018-01-12 DIAGNOSIS — I5033 Acute on chronic diastolic (congestive) heart failure: Secondary | ICD-10-CM | POA: Diagnosis not present

## 2018-01-12 DIAGNOSIS — J849 Interstitial pulmonary disease, unspecified: Secondary | ICD-10-CM

## 2018-01-12 LAB — PULMONARY FUNCTION TEST
DL/VA % pred: 131 %
DL/VA: 6.33 ml/min/mmHg/L
DLCO UNC: 15.2 ml/min/mmHg
DLCO unc % pred: 62 %
FEF 25-75 PRE: 2.01 L/s
FEF 25-75 Post: 2.27 L/sec
FEF2575-%CHANGE-POST: 12 %
FEF2575-%PRED-PRE: 81 %
FEF2575-%Pred-Post: 92 %
FEV1-%Change-Post: 2 %
FEV1-%Pred-Post: 74 %
FEV1-%Pred-Pre: 72 %
FEV1-Post: 1.73 L
FEV1-Pre: 1.68 L
FEV1FVC-%CHANGE-POST: 4 %
FEV1FVC-%Pred-Pre: 103 %
FEV6-%CHANGE-POST: -1 %
FEV6-%PRED-POST: 68 %
FEV6-%PRED-PRE: 69 %
FEV6-Post: 1.94 L
FEV6-Pre: 1.97 L
FEV6FVC-%CHANGE-POST: 0 %
FEV6FVC-%Pred-Post: 103 %
FEV6FVC-%Pred-Pre: 102 %
FVC-%CHANGE-POST: -1 %
FVC-%PRED-POST: 66 %
FVC-%Pred-Pre: 68 %
FVC-Post: 1.94 L
FVC-Pre: 1.97 L
PRE FEV1/FVC RATIO: 85 %
Post FEV1/FVC ratio: 89 %
Post FEV6/FVC ratio: 100 %
Pre FEV6/FVC Ratio: 100 %
RV % pred: 89 %
RV: 1.59 L
TLC % pred: 64 %
TLC: 3.27 L

## 2018-01-12 LAB — BASIC METABOLIC PANEL
BUN: 24 mg/dL — AB (ref 6–23)
CHLORIDE: 92 meq/L — AB (ref 96–112)
CO2: 33 meq/L — AB (ref 19–32)
CREATININE: 1.1 mg/dL (ref 0.40–1.20)
Calcium: 10.2 mg/dL (ref 8.4–10.5)
GFR: 67.71 mL/min (ref >60.00)
Glucose, Bld: 103 mg/dL — ABNORMAL HIGH (ref 70–99)
POTASSIUM: 4.9 meq/L (ref 3.5–5.1)
Sodium: 133 mEq/L — ABNORMAL LOW (ref 135–145)

## 2018-01-12 NOTE — Assessment & Plan Note (Addendum)
States she is compliant with Lasix 40 BID Weight is down 25 pounds BUN / creatinine Bump Plan: Continue Lasix 40 mg in the morning and decrease to 20 mg in the evening BMET to assess renal function today Follow up BMET in 1 week Follow up in 2 months with Dr. Lake Bells

## 2018-01-12 NOTE — Patient Instructions (Addendum)
It is good to see you today. Please work on quitting smoking. Do not smoke while wearing your oxygen. Please call 1-800-quit now for nicotine replacement therapy to help you quit smoking. Take Symbicort 2 puff in the morning and 2 puffs in the evening. Thisi s your maintenance medication Take every day without fail. Rinse mouth after use. Use your albuterol rescue inhaler as needed for worsening shortness of breath up to 4 times a day. Continue wearing your oxygen at 4 L night and day. Oxygen saturation goals are 88-92% Continue lasix 40 mg in the morning and 20 mg in the evening We will check some labs today .( BMET) We will call you with the results Repeat BMET in 1 week ( No appointment necessary) We will schedule your polysomnogram for sleep evaluation of OSA for CPAP start. Follow up with Dr. Lake Bells or Judson Roch NP in 2 months. Please contact office for sooner follow up if symptoms do not improve or worsen or seek emergency care

## 2018-01-12 NOTE — Assessment & Plan Note (Signed)
Wear your oxygen at 4 L [place] day and night. Saturation goals are > 92%

## 2018-01-12 NOTE — Progress Notes (Addendum)
History of Present Illness Diane Gilbert is a 52 y.o. female with respiratory bronchiolitis interstitial lung disease, chronic diastolic heart failure, obstructive sleep apnea with CPAP noncompliance, Schizophrenia, and ongoing tobacco use, cocaine use. She is a former Dr. Gwenette Gilbert patient. She is now followed by Dr. Lake Gilbert  Former patient of Dr. Gwenette Gilbert with respiratory bronchiolitis interstitial lung disease, chronic diastolic heart failure, obstructive sleep apnea with CPAP noncompliance, and ongoing tobacco use.  Baseline weight is around 200 pounds when euvolemic.  Baseline O2 saturation 2 L/min.  Still smoking as of May 2019   01/12/2018  Follow up. Pt was seen by Dr. Lake Gilbert 11/10/2017 for worsening dyspnea and leg swelling. History of fever and chills about 2 weeks prior to his visit with the patient. At that time she was not using her Symbicort maintenance , and she told Dr. Lake Gilbert that lasix 20 mg did not make her urinate.She presents today stating her breathing is better. She states she has been compliant with Lasix 40 mg twice  daily and her weight has dropped from 215 pounds to 187 pounds in the last 2 months. Her BLE edema is resolved.She continues to smoke.She states she is smoking 1/2 pack per day. She states she is not really using her Symbicort. We reviewed maintenance vs rescue treatment. She states she is using her rescue inhaler as needed. Cellulitis has resolved.She denies fever, chest pain, orthopnea or hemoptysis. She does endorse some palpitations and finger tingling which she notices when she has a panic attack.    Test Results: HRCT 12/2013:  Mild GG, centrilobular nodularity Chest x-ray February 2019 images independently reviewed showing cardiomegaly, fine groundglass bilaterally  PFT PFTs 2015:  FEV1 1.48 (56%), but normal ratio.  No BD response.  TLC 60%, mild airtrapping.  DLCO 45%, but corrects to normal with Av March 2017 pulmonary function testing ratio 85%,  FVC 1.61 L (50% predicted) sees, FEV1 1.37 L (53% predicted), total lung capacity 3.14 L (58% predicted), DLCO 13.3 (49% predicted).  Path: Bronch 03/2014:  BAL with 85 WBC's, 21% eos.  TBBX with normal lung parenchyma, but not enough for possible specific diagnosis  Pred trial 04/16/2014 >> unclear if any better.  VATS BX 06/2014:  RB-ILD (severe), ??? Component NSIP included in the differential  Labs: HP panel negative 03/2014 Autoimmune labs normal 07/2014  Polysomnogram: 2015 AHI 10, O2 saturation less than 88% for 44 minutes  6 Min walk March 2017 6 minute walk distance 144 m, O2 saturation nadir 81% on room air      CBC Latest Ref Rng & Units 11/08/2017 11/08/2017 08/13/2017  WBC 4.0 - 10.5 K/uL - 8.9 9.9  Hemoglobin 12.0 - 15.0 g/dL 17.3(H) 13.4 13.8  Hematocrit 36.0 - 46.0 % 51.0(H) 47.0(H) 50.2(H)  Platelets 150 - 400 K/uL - 306 318    BMP Latest Ref Rng & Units 01/12/2018 11/08/2017 08/13/2017  Glucose 70 - 99 mg/dL 103(H) 97 167(H)  BUN 6 - 23 mg/dL 24(H) 9 9  Creatinine 0.40 - 1.20 mg/dL 1.10 0.80 0.60  Sodium 135 - 145 mEq/L 133(L) 141 138  Potassium 3.5 - 5.1 mEq/L 4.9 4.2 3.6  Chloride 96 - 112 mEq/L 92(L) 102 95(L)  CO2 19 - 32 mEq/L 33(H) - 31  Calcium 8.4 - 10.5 mg/dL 10.2 - 8.4(L)    BNP    Component Value Date/Time   BNP 405.5 (H) 08/12/2017 0339    ProBNP    Component Value Date/Time   PROBNP 12.0 09/06/2015 1404  PFT    Component Value Date/Time   FEV1PRE 1.68 01/12/2018 1105   FEV1POST 1.73 01/12/2018 1105   FVCPRE 1.97 01/12/2018 1105   FVCPOST 1.94 01/12/2018 1105   TLC 3.27 01/12/2018 1105   DLCOUNC 15.20 01/12/2018 1105   PREFEV1FVCRT 85 01/12/2018 1105   PSTFEV1FVCRT 89 01/12/2018 1105    No results found.   Past medical hx Past Medical History:  Diagnosis Date  . Alcohol abuse   . Anxiety   . Arthritis   . CHF (congestive heart failure) ([place])   . Depression   . Headache(784.0)   . Hypertension   . Insomnia   .  Interstitial lung disease ([place])   . Oxygen dependent   . Schizophrenia ([place])   . Shortness of breath dyspnea   . Sleep apnea   . Sleep apnea      Social History   Tobacco Use  . Smoking status: Current Some Day Smoker    Packs/day: 1.50    Years: 30.00    Pack years: 45.00    Types: Cigarettes  . Smokeless tobacco: Never Used  . Tobacco comment: smoking 3 cigarettes daily "every now and then" 11/04/16  Substance Use Topics  . Alcohol use: No    Alcohol/week: 0.0 oz    Frequency: Never    Comment: Pt has previously abused alcohol , pt states she still drinks "every now and then"  . Drug use: Yes    Types: Cocaine    Diane Gilbert reports that she has been smoking cigarettes.  She has a 45.00 pack-year smoking history. She has never used smokeless tobacco. She reports that she has current or past drug history. Drug: Cocaine. She reports that she does not drink alcohol.  Tobacco Cessation: Continues to smoke 1/2 PPD  I have spent 4 minutes counseling patient on smoking cessation this visit. Past surgical hx, Family hx, Social hx all reviewed.  Current Outpatient Medications on File Prior to Visit  Medication Sig  . acetaminophen (TYLENOL) 325 MG tablet Take 650 mg by mouth every 6 (six) hours as needed for mild pain or headache.  . albuterol (PROVENTIL HFA;VENTOLIN HFA) 108 (90 BASE) MCG/ACT inhaler Inhale 2 puffs into the lungs every 6 (six) hours as needed for wheezing or shortness of breath.  Marland Kitchen amLODipine (NORVASC) 5 MG tablet Take 5 mg daily by mouth.  . budesonide-formoterol (SYMBICORT) 160-4.5 MCG/ACT inhaler Inhale 2 puffs into the lungs 2 (two) times daily. (Patient taking differently: Inhale 2 puffs into the lungs daily as needed (shortness of breath/wheezing). )  . diclofenac sodium (VOLTAREN) 1 % GEL Apply 2 g topically daily as needed (pain).  . furosemide (LASIX) 40 MG tablet Take 1 tablet (40 mg total) by mouth 2 (two) times daily. (Patient taking differently: Take  40 mg by mouth daily. )  . lisinopril-hydrochlorothiazide (PRINZIDE,ZESTORETIC) 20-25 MG tablet Take 1 tablet by mouth daily.  . Menthol-Methyl Salicylate (MUSCLE RUB) 10-15 % CREA Apply 1 application topically as needed for muscle pain.  . Misc. Devices MISC CPAP @@ Night  . nicotine polacrilex (NICORETTE) 2 MG gum Take 2 mg by mouth as needed for smoking cessation.  . OXYGEN Inhale 4 L into the lungs continuous.   Marland Kitchen QUEtiapine (SEROQUEL) 300 MG tablet Take 1 tablet (300 mg total) by mouth at bedtime.  . rosuvastatin (CRESTOR) 10 MG tablet Take 10 mg by mouth daily.  Marland Kitchen tiZANidine (ZANAFLEX) 4 MG tablet Take 4 mg by mouth 2 (two) times daily.  . traZODone (DESYREL)  100 MG tablet Take 100 mg by mouth at bedtime.   No current facility-administered medications on file prior to visit.      No Known Allergies  Review Of Systems:  Constitutional:   +  weight loss, No night sweats,  Fevers, chills, fatigue, or  lassitude.  HEENT:   No headaches,  Difficulty swallowing,  Tooth/dental problems, or  Sore throat,                No sneezing, itching, ear ache, nasal congestion, post nasal drip,   CV:  No chest pain,  Orthopnea, PND, swelling in lower extremities, anasarca, dizziness, palpitations, syncope.   GI  No heartburn, indigestion, abdominal pain, nausea, vomiting, diarrhea, change in bowel habits, loss of appetite, bloody stools.   Resp: + intermittent shortness of breath with exertion or at rest.  No excess mucus, no productive cough,  No non-productive cough,  No coughing up of blood.  No change in color of mucus.  No wheezing.  No chest wall deformity  Skin: no rash or lesions.  GU: no dysuria, change in color of urine, no urgency or frequency.  No flank pain, no hematuria   MS:  No joint pain or swelling.  No decreased range of motion.  No back pain.  Psych:  No change in mood or affect. No depression or anxiety.  No memory loss.   Vital Signs BP 122/70 (BP Location: Left Arm,  Cuff Size: Normal)   Pulse 68   Ht _0  (1.626 m)   Wt 187 lb (84.8 kg)   LMP 04/06/2017   SpO2 100%   BMI 32.10 kg/m    Physical Exam:  General- No distress,  A&Ox3, pleasant, flat affect ENT: No sinus tenderness, TM clear, pale nasal mucosa, no oral exudate,no post nasal drip, no LAN Cardiac: S1, S2, regular rate and rhythm, no murmur Chest: No wheeze/ rales/ dullness; no accessory muscle use, no nasal flaring, no sternal retractions Abd.: Soft Non-tender, ND, BS +, Body mass index is 32.1 kg/m. Ext: No clubbing cyanosis, trace BLE edema Neuro:  normal strength Skin: No rashes, warm and dry Psych: Anxious with flat affect   Assessment/Plan  Chronic respiratory failure with hypoxia (HCC) Wear your oxygen at 4 L [place] day and night. Saturation goals are > 92%  Respiratory bronchiolitis interstitial lung disease ([place]) Continues to smoke Not compliant with Symbicort Plan: Please work on quitting smoking. Do not smoke while wearing your oxygen. Please call 1-800-quit now for nicotine replacement therapy to help you quit smoking. Take Symbicort 2 puff in the morning and 2 puffs in the evening. Thisi s your maintenance medication Take every day without fail. Rinse mouth after use. Use your albuterol rescue inhaler as needed for worsening shortness of breath up to 4 times a day.    OSA (obstructive sleep apnea) Currently not using CPAP Is interested is resuming treatment Plan: We will schedule your polysomnogram for sleep evaluation of OSA for CPAP start.   Acute on chronic diastolic CHF (congestive heart failure) ([place]) States she is compliant with Lasix 40 BID Weight is down 25 pounds BUN / creatinine Bump Plan: Continue Lasix 40 mg in the morning and decrease to 20 mg in the evening BMET to assess renal function today Follow up BMET in 1 week Follow up in 2 months with Dr. Lake Gilbert  Smoking addiction Smoking 1/2 ppd Plan: Please work on quitting smoking. Do  not smoke while wearing your oxygen. Please call 1-800-quit now for  nicotine replacement therapy to help you quit smoking.   Addendum Pt. Was seen in the ED for chest pressure 01/17/18. She states she had used cocaine 3 weeks prior. She had been out of her meds x 3 days due to cost.  Magdalen Spatz, NP 01/12/2018  8:10 PM

## 2018-01-12 NOTE — Telephone Encounter (Signed)
Please have patient adjust her lasix to 40 mg in the morning and 20 mg in the evening. Please sent in Prescription for 20 mg tablets if she needs them. Please have patient come in for follow up BMET in  1 weeks ( She does not need an appointment).  Follow up appointment in 2 months with Dr. Lake Bells ( Not the 3 months we scheduled in the office yesterday) Thanks so much.

## 2018-01-12 NOTE — Assessment & Plan Note (Signed)
Smoking 1/2 ppd Plan: Please work on quitting smoking. Do not smoke while wearing your oxygen. Please call 1-800-quit now for nicotine replacement therapy to help you quit smoking.

## 2018-01-12 NOTE — Assessment & Plan Note (Signed)
Continues to smoke Not compliant with Symbicort Plan: Please work on quitting smoking. Do not smoke while wearing your oxygen. Please call 1-800-quit now for nicotine replacement therapy to help you quit smoking. Take Symbicort 2 puff in the morning and 2 puffs in the evening. Thisi s your maintenance medication Take every day without fail. Rinse mouth after use. Use your albuterol rescue inhaler as needed for worsening shortness of breath up to 4 times a day.

## 2018-01-12 NOTE — Assessment & Plan Note (Signed)
Currently not using CPAP Is interested is resuming treatment Plan: We will schedule your polysomnogram for sleep evaluation of OSA for CPAP start.

## 2018-01-13 MED ORDER — FUROSEMIDE 20 MG PO TABS: 20.0000 mg | ORAL_TABLET | Freq: Every day | ORAL | 2 refills | Status: DC

## 2018-01-13 NOTE — Addendum Note (Signed)
Addended by: Desmond Dike C on: 01/13/2018 10:29 AM   Modules accepted: Orders

## 2018-01-13 NOTE — Telephone Encounter (Signed)
Spoke with pt. She is aware of Sarah's message. Rx has been sent in for Lasix 43m. Order has been placed for BMET. OV has been scheduled with BQ on 03/16/18 at 1:45pm. Nothing further was needed.

## 2018-01-17 ENCOUNTER — Ambulatory Visit (HOSPITAL_COMMUNITY)
Admission: EM | Admit: 2018-01-17 | Discharge: 2018-01-17 | Disposition: A | Discharge status: Home / Self Care | Payer: Medicaid Other

## 2018-01-17 ENCOUNTER — Encounter (HOSPITAL_COMMUNITY): Payer: Self-pay | Admitting: Emergency Medicine

## 2018-01-17 ENCOUNTER — Emergency Department (HOSPITAL_COMMUNITY): Payer: Medicaid Other

## 2018-01-17 ENCOUNTER — Emergency Department (HOSPITAL_COMMUNITY)
Admission: EM | Admit: 2018-01-17 | Discharge: 2018-01-17 | Disposition: A | Discharge status: Home / Self Care | Payer: Medicaid Other | Attending: Emergency Medicine | Admitting: Emergency Medicine

## 2018-01-17 ENCOUNTER — Other Ambulatory Visit: Payer: Self-pay

## 2018-01-17 DIAGNOSIS — F1410 Cocaine abuse, uncomplicated: Secondary | ICD-10-CM | POA: Diagnosis not present

## 2018-01-17 DIAGNOSIS — I110 Hypertensive heart disease with heart failure: Secondary | ICD-10-CM | POA: Insufficient documentation

## 2018-01-17 DIAGNOSIS — R05 Cough: Secondary | ICD-10-CM | POA: Diagnosis not present

## 2018-01-17 DIAGNOSIS — R0789 Other chest pain: Secondary | ICD-10-CM | POA: Insufficient documentation

## 2018-01-17 DIAGNOSIS — F17210 Nicotine dependence, cigarettes, uncomplicated: Secondary | ICD-10-CM | POA: Diagnosis not present

## 2018-01-17 DIAGNOSIS — Z79899 Other long term (current) drug therapy: Secondary | ICD-10-CM | POA: Diagnosis not present

## 2018-01-17 DIAGNOSIS — R079 Chest pain, unspecified: Secondary | ICD-10-CM | POA: Diagnosis present

## 2018-01-17 DIAGNOSIS — I5032 Chronic diastolic (congestive) heart failure: Secondary | ICD-10-CM | POA: Diagnosis not present

## 2018-01-17 DIAGNOSIS — R002 Palpitations: Secondary | ICD-10-CM | POA: Insufficient documentation

## 2018-01-17 DIAGNOSIS — R0601 Orthopnea: Secondary | ICD-10-CM | POA: Insufficient documentation

## 2018-01-17 DIAGNOSIS — R0600 Dyspnea, unspecified: Secondary | ICD-10-CM | POA: Insufficient documentation

## 2018-01-17 DIAGNOSIS — R0602 Shortness of breath: Secondary | ICD-10-CM | POA: Insufficient documentation

## 2018-01-17 LAB — CBC
HEMATOCRIT: 56.9 % — AB (ref 36.0–46.0)
Hemoglobin: 16.8 g/dL — ABNORMAL HIGH (ref 12.0–15.0)
MCH: 22.5 pg — ABNORMAL LOW (ref 26.0–34.0)
MCHC: 29.5 g/dL — ABNORMAL LOW (ref 30.0–36.0)
MCV: 76.1 fL — ABNORMAL LOW (ref 78.0–100.0)
Platelets: 227 10*3/uL (ref 150–400)
RBC: 7.48 MIL/uL — ABNORMAL HIGH (ref 3.87–5.11)
RDW: 23.2 % — AB (ref 11.5–15.5)
WBC: 8.7 10*3/uL (ref 4.0–10.5)

## 2018-01-17 LAB — BASIC METABOLIC PANEL
ANION GAP: 11 (ref 5–15)
BUN: 13 mg/dL (ref 6–20)
CO2: 26 mmol/L (ref 22–32)
Calcium: 9.8 mg/dL (ref 8.9–10.3)
Chloride: 98 mmol/L (ref 98–111)
Creatinine, Ser: 0.79 mg/dL (ref 0.44–1.00)
GFR calc Af Amer: >60 mL/min (ref >60)
GFR calc non Af Amer: >60 mL/min (ref >60)
Glucose, Bld: 89 mg/dL (ref 70–99)
POTASSIUM: 4.3 mmol/L (ref 3.5–5.1)
SODIUM: 135 mmol/L (ref 135–145)

## 2018-01-17 LAB — I-STAT TROPONIN, ED
TROPONIN I, POC: 0 ng/mL (ref 0.00–0.08)
Troponin i, poc: 0 ng/mL (ref 0.00–0.08)

## 2018-01-17 LAB — BRAIN NATRIURETIC PEPTIDE: B Natriuretic Peptide: 24.9 pg/mL (ref 0.0–100.0)

## 2018-01-17 MED ORDER — LISINOPRIL-HYDROCHLOROTHIAZIDE 20-25 MG PO TABS: 1.0000 | ORAL_TABLET | Freq: Every day | ORAL | Status: DC

## 2018-01-17 MED ORDER — HYDROCHLOROTHIAZIDE 25 MG PO TABS
25.0000 mg | ORAL_TABLET | Freq: Every day | ORAL | Status: DC
  Filled 2018-01-17: qty 1

## 2018-01-17 MED ORDER — LISINOPRIL-HYDROCHLOROTHIAZIDE 20-25 MG PO TABS: 1.0000 | ORAL_TABLET | Freq: Every day | ORAL | 1 refills | Status: DC

## 2018-01-17 MED ORDER — NICOTINE 21 MG/24HR TD PT24
21.0000 mg | MEDICATED_PATCH | Freq: Once | TRANSDERMAL | Status: DC
  Administered 2018-01-17: 21 mg via TRANSDERMAL
  Filled 2018-01-17: qty 1

## 2018-01-17 MED ORDER — AMLODIPINE BESYLATE 5 MG PO TABS
5.0000 mg | ORAL_TABLET | Freq: Every day | ORAL | Status: DC
  Administered 2018-01-17: 5 mg via ORAL
  Filled 2018-01-17: qty 1

## 2018-01-17 MED ORDER — ROSUVASTATIN CALCIUM 10 MG PO TABS: 10.0000 mg | ORAL_TABLET | Freq: Every day | ORAL | 1 refills | Status: AC

## 2018-01-17 MED ORDER — AMLODIPINE BESYLATE 5 MG PO TABS: 5.0000 mg | ORAL_TABLET | Freq: Every day | ORAL | 1 refills | Status: DC

## 2018-01-17 MED ORDER — LISINOPRIL 20 MG PO TABS
20.0000 mg | ORAL_TABLET | Freq: Every day | ORAL | Status: DC
  Administered 2018-01-17: 20 mg via ORAL
  Filled 2018-01-17: qty 1

## 2018-01-17 MED ORDER — ALBUTEROL SULFATE HFA 108 (90 BASE) MCG/ACT IN AERS
2.0000 | INHALATION_SPRAY | RESPIRATORY_TRACT | Status: DC | PRN
  Administered 2018-01-17: 2 via RESPIRATORY_TRACT
  Filled 2018-01-17: qty 6.7

## 2018-01-17 NOTE — Progress Notes (Signed)
CSW notified that pt is in need of medication refill. CSW has updated RNCM of this and RNCM currently working on assisting pt as needed. There are no further cSW needs. CSW will sign off.   Virgie Dad. Ladora Osterberg, MSW, [facility] Clinical Social Worker [phone]

## 2018-01-17 NOTE — ED Notes (Signed)
Pt states she had some chest pain lasting 1 minute a little while ago but none now.

## 2018-01-17 NOTE — ED Provider Notes (Signed)
[facility] EMERGENCY DEPARTMENT Provider Note   CSN: [id number] Arrival date & time: 01/17/18  0813     History   Chief Complaint Chief Complaint  Patient presents with  . Chest Pain  . Palpitations    HPI Diane Gilbert is a 52 y.o. female.  Patient is a 52 year old female with extensive medical history including right-sided heart failure, pulmonary hypertension, polysubstance abuse including cocaine and alcohol use, interstitial lung disease who is presenting today with feeling like her heart is in her throat and it is pulsating.  Also she complains of orthopnea and dyspnea on exertion.  She states the symptoms have been going on the last 2 weeks.  Of note patient had a catheterization done on the ninth of this month that showed normal coronary arteries and no need for antiplatelet therapy at that time.  Patient states for the last 3 days she is run out of her medications because of not being able to afford the new prescriptions.  She has still been taking 40 mg of Lasix daily.  She denies any new swelling.  She has had an ongoing cough which is chronic but denies fever.    The history is provided by the patient.  Chest Pain   This is a recurrent problem. Episode onset: 2 weeks ago. The problem occurs constantly. The problem has been gradually worsening. Associated with: it is there constantly but worsening SOB. Pain location: feels like it is in her throat and she can feel pulsating. The pain is mild. The quality of the pain is described as pressure-like. The pain does not radiate. Duration of episode(s) is 2 weeks. Associated symptoms include cough, palpitations and shortness of breath. Pertinent negatives include no exertional chest pressure, no leg pain, no lower extremity edema, no nausea, no syncope and no vomiting. She has tried rest for the symptoms. The treatment provided mild relief.  Her past medical history is significant for CHF, hyperlipidemia and  hypertension.  Pertinent negatives for past medical history include no diabetes and no PE. Past medical history comments: last cocaine use was 2 weeks ago  Palpitations   Associated symptoms include chest pain, cough and shortness of breath. Pertinent negatives include no exertional chest pressure, no syncope, no nausea, no vomiting, no leg pain and no lower extremity edema. Past medical history comments: last cocaine use was 2 weeks ago.    Past Medical History:  Diagnosis Date  . Alcohol abuse   . Anxiety   . Arthritis   . CHF (congestive heart failure) ([place])   . Depression   . Headache(784.0)   . Hypertension   . Insomnia   . Interstitial lung disease ([place])   . Oxygen dependent   . Schizophrenia ([place])   . Shortness of breath dyspnea   . Sleep apnea   . Sleep apnea     Patient Active Problem List   Diagnosis Date Noted  . Tobacco abuse 01/12/2018  . Dyspnea on exertion 01/04/2018  . Acute diastolic heart failure ([place]) 07/24/2017  . Acute on chronic diastolic CHF (congestive heart failure) ([place]) 05/09/2017  . Respiratory failure, acute and chronic ([place]) 05/09/2017  . Facial swelling 03/23/2017  . Chronic respiratory failure with hypoxia ([place]) 03/23/2017  . Acute respiratory failure with hypoxia ([place]) 03/23/2017  . Community acquired pneumonia   . OSA (obstructive sleep apnea) 07/21/2016  . Increased oxygen demand 07/04/2016  . Acute diastolic congestive heart failure ([place])   . Hypoxia 03/24/2016  . Schizophrenia ([place])  03/24/2016  . Leg swelling 01/16/2016  . Dyspnea 09/06/2015  . Acute on chronic respiratory failure with hypoxia ([place]) 06/07/2014  . Respiratory bronchiolitis interstitial lung disease ([place]) 12/26/2013  . Irregular menstrual cycle 01/23/2013  . Smoking addiction 01/23/2013  . Alcohol abuse 02/19/2012  . Hyponatremia 02/19/2012  . Abdominal pain 02/19/2012  . Hypokalemia 02/19/2012    Past Surgical History:  Procedure Laterality Date  . BACK SURGERY     . LUNG BIOPSY Left 07/16/2014   Procedure: LUNG BIOPSY;  Surgeon: Grace Isaac, MD;  Location: [facility];  Service: Thoracic;  Laterality: Left;  Marland Kitchen MULTIPLE TOOTH EXTRACTIONS    . RIGHT/LEFT HEART CATH AND CORONARY ANGIOGRAPHY N/A 01/04/2018   Procedure: RIGHT/LEFT HEART CATH AND CORONARY ANGIOGRAPHY;  Surgeon: Adrian Prows, MD;  Location: [facility];  Service: Cardiovascular;  Laterality: N/A;  . VIDEO ASSISTED THORACOSCOPY Left 07/16/2014   Procedure: VIDEO ASSISTED THORACOSCOPY;  Surgeon: Grace Isaac, MD;  Location: [facility];  Service: Thoracic;  Laterality: Left;  Marland Kitchen VIDEO BRONCHOSCOPY Bilateral 04/11/2014   Procedure: VIDEO BRONCHOSCOPY WITH FLUORO;  Surgeon: Kathee Delton, MD;  Location: [facility];  Service: Cardiopulmonary;  Laterality: Bilateral;  . VIDEO BRONCHOSCOPY N/A 07/16/2014   Procedure: VIDEO BRONCHOSCOPY;  Surgeon: Grace Isaac, MD;  Location: [facility] OR;  Service: Thoracic;  Laterality: N/A;  . WEDGE RESECTION Left 07/16/2014   Procedure: WEDGE RESECTION;  Surgeon: Grace Isaac, MD;  Location: [facility] OR;  Service: Thoracic;  Laterality: Left;  left upper lobe lung resection     OB History    Gravida  0   Para  0   Term  0   Preterm  0   AB  0   Living  0     SAB  0   TAB  0   Ectopic  0   Multiple  0   Live Births               Home Medications    Prior to Admission medications   Medication Sig Start Date End Date Taking? Authorizing Provider  acetaminophen (TYLENOL) 325 MG tablet Take 650 mg by mouth every 6 (six) hours as needed for mild pain or headache.    [provider]  albuterol (PROVENTIL HFA;VENTOLIN HFA) 108 (90 BASE) MCG/ACT inhaler Inhale 2 puffs into the lungs every 6 (six) hours as needed for wheezing or shortness of breath. 05/09/15   Juanito Doom, MD  amLODipine (NORVASC) 5 MG tablet Take 5 mg daily by mouth.    [provider]  budesonide-formoterol (SYMBICORT) 160-4.5 MCG/ACT inhaler Inhale 2  puffs into the lungs 2 (two) times daily. Patient taking differently: Inhale 2 puffs into the lungs daily as needed (shortness of breath/wheezing).  11/10/17   Juanito Doom, MD  diclofenac sodium (VOLTAREN) 1 % GEL Apply 2 g topically daily as needed (pain).    [provider]  furosemide (LASIX) 20 MG tablet Take 1 tablet (20 mg total) by mouth daily. 01/13/18   Magdalen Spatz, NP  furosemide (LASIX) 40 MG tablet Take 1 tablet (40 mg total) by mouth 2 (two) times daily. Patient taking differently: Take 40 mg by mouth daily.  11/10/17   Juanito Doom, MD  lisinopril-hydrochlorothiazide (PRINZIDE,ZESTORETIC) 20-25 MG tablet Take 1 tablet by mouth daily.    [provider]  Menthol-Methyl Salicylate (MUSCLE RUB) 10-15 % CREA Apply 1 application topically as needed for muscle pain.    [provider]  Misc. Devices MISC CPAP @@ Night    [provider]  nicotine polacrilex (NICORETTE) 2 MG gum Take 2 mg by mouth as needed for smoking cessation.    [provider]  OXYGEN Inhale 4 L into the lungs continuous.     [provider]  QUEtiapine (SEROQUEL) 300 MG tablet Take 1 tablet (300 mg total) by mouth at bedtime. 06/18/17   Arfeen, Arlyce Harman, MD  rosuvastatin (CRESTOR) 10 MG tablet Take 10 mg by mouth daily.    [provider]  tiZANidine (ZANAFLEX) 4 MG tablet Take 4 mg by mouth 2 (two) times daily.    [provider]  traZODone (DESYREL) 100 MG tablet Take 100 mg by mouth at bedtime.    [provider]    Family History Family History  Problem Relation Age of Onset  . Breast cancer Mother   . Obstructive Sleep Apnea Mother   . COPD Maternal Aunt     Social History Social History   Tobacco Use  . Smoking status: Current Every Day Smoker    Packs/day: 1.50    Years: 30.00    Pack years: 45.00    Types: Cigarettes  . Smokeless tobacco: Never Used  . Tobacco comment: smoking 3 cigarettes daily "every  now and then" 11/04/16  Substance Use Topics  . Alcohol use: No    Alcohol/week: 0.0 oz    Frequency: Never    Comment: Pt has previously abused alcohol , pt states she still drinks "every now and then"  . Drug use: Yes    Types: Cocaine     Allergies   Patient has no known allergies.   Review of Systems Review of Systems  Respiratory: Positive for cough and shortness of breath.   Cardiovascular: Positive for chest pain and palpitations. Negative for syncope.  Gastrointestinal: Negative for nausea and vomiting.  All other systems reviewed and are negative.    Physical Exam Updated Vital Signs BP (!) 160/102 (BP Location: Left Arm)   Pulse 97   Temp 98 F (36.7 C) (Oral)   Resp 17   Ht _0  (1.626 m)   Wt 85.5 kg (188 lb 9.6 oz)   LMP 04/06/2017   SpO2 94%   BMI 32.37 kg/m   Physical Exam  Constitutional: She is oriented to person, place, and time. She appears well-developed and well-nourished. No distress.  HENT:  Head: Normocephalic and atraumatic.  Mouth/Throat: Oropharynx is clear and moist.  Eyes: Pupils are equal, round, and reactive to light. Conjunctivae and EOM are normal.  Neck: Normal range of motion. Neck supple.  Cardiovascular: Regular rhythm and intact distal pulses. Tachycardia present.  No murmur heard.  No systolic murmur is present. Pulmonary/Chest: Effort normal. No respiratory distress. She has decreased breath sounds in the left middle field and the left lower field. She has no wheezes. She has rales.  Abdominal: Soft. She exhibits no distension. There is no tenderness. There is no rebound and no guarding.  Musculoskeletal: Normal range of motion. She exhibits no edema or tenderness.  Neurological: She is alert and oriented to person, place, and time.  Skin: Skin is warm and dry. No rash noted. No erythema.  Psychiatric: She has a normal mood and affect. Her behavior is normal.  Nursing note and vitals reviewed.    ED Treatments / Results    Labs (all labs ordered are listed, but only abnormal results are displayed) Labs Reviewed  CBC - Abnormal; Notable for  the following components:      Result Value   RBC 7.48 (*)    Hemoglobin 16.8 (*)    HCT 56.9 (*)    MCV 76.1 (*)    MCH 22.5 (*)    MCHC 29.5 (*)    RDW 23.2 (*)    All other components within normal limits  BASIC METABOLIC PANEL  BRAIN NATRIURETIC PEPTIDE  I-STAT TROPONIN, ED  I-STAT TROPONIN, ED    EKG EKG Interpretation  Date/Time:  Monday January 17 2018 08:18:48 EDT Ventricular Rate:  96 PR Interval:  172 QRS Duration: 78 QT Interval:  354 QTC Calculation: 447 R Axis:   -137 Text Interpretation:  Normal sinus rhythm Biatrial enlargement Low voltage QRS Inferior infarct , age undetermined Possible Anterolateral infarct , age undetermined No significant change since last tracing Confirmed by Blanchie Dessert [phone]) on 01/17/2018 8:25:38 AM   Radiology Dg Chest 2 View  Result Date: 01/17/2018 CLINICAL DATA:  Mid chest pain and palpitations for 2 days. EXAM: CHEST - 2 VIEW COMPARISON:  Single-view of the chest 08/11/2017 and 07/04/2016. CT chest 03/23/2017. FINDINGS: The lungs are emphysematous but clear. There is cardiomegaly. No pneumothorax or pleural effusion. No acute or focal bony abnormality. IMPRESSION: No acute disease. Emphysema. Cardiomegaly. Electronically Signed   By: Inge Rise M.D.   On: 01/17/2018 09:13    Procedures Procedures (including critical care time)  Medications Ordered in ED Medications  amLODipine (NORVASC) tablet 5 mg (has no administration in time range)  lisinopril-hydrochlorothiazide (PRINZIDE,ZESTORETIC) 20-25 MG per tablet 1 tablet (has no administration in time range)  nicotine (NICODERM CQ - dosed in mg/24 hours) patch 21 mg (has no administration in time range)     Initial Impression / Assessment and Plan / ED Course  I have reviewed the triage vital signs and the nursing notes.  Pertinent labs & imaging  results that were available during my care of the patient were reviewed by me and considered in my medical decision making (see chart for details).     Patient with multiple medical problems presenting today with sensation of discomfort in her throat and feeling like her heart is beating fast.  Patient has been out of her medications for the last 3 days due to inability to afford them.  She has been taking Lasix daily.  Patient did have a catheterization done on the ninth of this month which showed normal coronary arteries and mild pulmonary hypertension.  Last echo was done in January of this year with normal EF.  Patient last used cocaine 2 weeks ago and continues to smoke cigarettes.  She denies any infectious symptoms and is afebrile here.  Lower suspicion for pneumonia at this time however patient also does have chronic lung disease which may be some of the cause of her dyspnea on exertion.  She has chronic orthopnea based on Dr. Irven Shelling note and oxygen saturation here is 94% but she was placed on 2 L which brought her to 100%.  She is able to speak in complete sentences and has no wheezing on exam.  Concern for possible CHF versus symptoms related to poor medical compliance and blood pressure control.  We will give patient a dose of her home medications.  CBC, BMP and BNP are pending.  Patient's EKG shows sinus tachycardia without acute changes and chest x-ray with cardiomegaly but no acute findings.  3:39 PM Patient's labs are within normal limits.  BNP is normal.  Patient symptoms may be related to  her lung disease which she states she is out of her albuterol as well.  Patient's vital signs improved after getting her medications.  Low suspicion the patient's symptoms are cardiac in nature given her normal catheterization 3 weeks ago.  Patient was given an inhaler and new prescriptions for her medications which she is out of.  Will discharge home.  Final Clinical Impressions(s) / ED Diagnoses    Final diagnoses:  Atypical chest pain    ED Discharge Orders        Ordered    amLODipine (NORVASC) 5 MG tablet  Daily     01/17/18 1505    lisinopril-hydrochlorothiazide (PRINZIDE,ZESTORETIC) 20-25 MG tablet  Daily     01/17/18 1505    rosuvastatin (CRESTOR) 10 MG tablet  Daily     01/17/18 1505    amLODipine (NORVASC) 5 MG tablet  Daily     Pending       Blanchie Dessert, MD 01/17/18 1540

## 2018-01-17 NOTE — ED Notes (Signed)
Pt oob to bathroom with steady gait.

## 2018-01-17 NOTE — ED Notes (Signed)
Pt states she had chest pain about 5 minutes ago lasting 1 minute denies pain now.

## 2018-01-17 NOTE — ED Notes (Signed)
Pt stata she understands instructions Home stable with steady gait but request WC.Home with Friend.

## 2018-01-17 NOTE — ED Triage Notes (Signed)
Pt. Stated, I feel like my heart is in my throat for 2 weeks.

## 2018-01-17 NOTE — ED Notes (Signed)
ED Provider at bedside. 

## 2018-01-17 NOTE — Care Management Note (Signed)
Case Management Note  CM consulted for medication assistance.  CM confirmed that pt has active Medicaid.  There are no additional programs available for pt.  Pt's medication co-pays should usually run between $3-$4 each.  Updated Dr. Maryan Rued.  No further CM needs noted at this time.  Yakelin Grenier, Benjaman Lobe, RN 01/17/2018, 12:25 PM

## 2018-01-20 ENCOUNTER — Other Ambulatory Visit (INDEPENDENT_AMBULATORY_CARE_PROVIDER_SITE_OTHER): Payer: Medicaid Other

## 2018-01-20 DIAGNOSIS — E876 Hypokalemia: Secondary | ICD-10-CM | POA: Diagnosis not present

## 2018-01-20 DIAGNOSIS — E871 Hypo-osmolality and hyponatremia: Secondary | ICD-10-CM | POA: Diagnosis not present

## 2018-01-20 LAB — BASIC METABOLIC PANEL
BUN: 17 mg/dL (ref 6–23)
CHLORIDE: 94 meq/L — AB (ref 96–112)
CO2: 30 meq/L (ref 19–32)
CREATININE: 0.93 mg/dL (ref 0.40–1.20)
Calcium: 9.5 mg/dL (ref 8.4–10.5)
GFR: 82.18 mL/min (ref >60.00)
Glucose, Bld: 103 mg/dL — ABNORMAL HIGH (ref 70–99)
Potassium: 3.9 mEq/L (ref 3.5–5.1)
Sodium: 134 mEq/L — ABNORMAL LOW (ref 135–145)

## 2018-02-07 ENCOUNTER — Encounter (HOSPITAL_BASED_OUTPATIENT_CLINIC_OR_DEPARTMENT_OTHER): Payer: Self-pay

## 2018-02-23 ENCOUNTER — Other Ambulatory Visit: Payer: Self-pay | Admitting: Acute Care

## 2018-03-08 ENCOUNTER — Other Ambulatory Visit: Payer: Self-pay | Admitting: Acute Care

## 2018-03-16 ENCOUNTER — Other Ambulatory Visit (INDEPENDENT_AMBULATORY_CARE_PROVIDER_SITE_OTHER): Payer: Medicaid Other

## 2018-03-16 ENCOUNTER — Encounter: Payer: Self-pay | Admitting: Pulmonary Disease

## 2018-03-16 ENCOUNTER — Ambulatory Visit (INDEPENDENT_AMBULATORY_CARE_PROVIDER_SITE_OTHER): Payer: Medicaid Other | Admitting: Pulmonary Disease

## 2018-03-16 VITALS — BP 116/64 | HR 92 | Ht 64.57 in | Wt 190.0 lb

## 2018-03-16 DIAGNOSIS — I509 Heart failure, unspecified: Secondary | ICD-10-CM

## 2018-03-16 DIAGNOSIS — J84115 Respiratory bronchiolitis interstitial lung disease: Secondary | ICD-10-CM | POA: Diagnosis not present

## 2018-03-16 DIAGNOSIS — J9611 Chronic respiratory failure with hypoxia: Secondary | ICD-10-CM | POA: Diagnosis not present

## 2018-03-16 DIAGNOSIS — Z720 Tobacco use: Secondary | ICD-10-CM

## 2018-03-16 DIAGNOSIS — Z23 Encounter for immunization: Secondary | ICD-10-CM | POA: Diagnosis not present

## 2018-03-16 LAB — BASIC METABOLIC PANEL
BUN: 22 mg/dL (ref 6–23)
CHLORIDE: 100 meq/L (ref 96–112)
CO2: 32 meq/L (ref 19–32)
CREATININE: 1.02 mg/dL (ref 0.40–1.20)
Calcium: 10.2 mg/dL (ref 8.4–10.5)
GFR: 73.82 mL/min (ref >60.00)
Glucose, Bld: 95 mg/dL (ref 70–99)
Potassium: 4 mEq/L (ref 3.5–5.1)
Sodium: 138 mEq/L (ref 135–145)

## 2018-03-16 NOTE — Progress Notes (Signed)
Subjective:    Patient ID: Diane Gilbert, female    DOB: 1965/09/17, 52 y.o.   MRN: [id number]  Former patient of Dr. Gwenette Greet with respiratory bronchiolitis interstitial lung disease   HPI  Chief Complaint  Patient presents with  . Follow-up    She says her breathing has bene 'alright', she has been walking from her house to the local store every day, probably 2 blocks when she goes.    She isn't coughing right now.  She She is not taking the Symbicort right now.  She has had several falls recently in the last 2-3 weeks.  She says that she will be walking (not right after standing up) then she will "black out" and drop to the floor.     Past Medical History:  Diagnosis Date  . Alcohol abuse   . Anxiety   . Arthritis   . CHF (congestive heart failure) ([place])   . Depression   . Headache(784.0)   . Hypertension   . Insomnia   . Interstitial lung disease ([place])   . Oxygen dependent   . Schizophrenia ([place])   . Shortness of breath dyspnea   . Sleep apnea   . Sleep apnea      Review of Systems  Constitutional: Negative for chills, fatigue and fever.  HENT: Negative for hearing loss, postnasal drip and rhinorrhea.   Respiratory: Positive for cough and shortness of breath. Negative for wheezing.   Cardiovascular: Negative for chest pain, palpitations and leg swelling.       Objective:   Physical Exam Vitals:   03/16/18 1331  BP: 116/64  Pulse: 92  SpO2: 98%  Weight: 190 lb (86.2 kg)  Height: 5' 4.57" (1.64 m)    4 L [place]  Gen: chronically ill appearing HENT: OP clear, TM's clear, neck supple PULM: CTA B, normal percussion CV: RRR, no mgr, trace edema GI: BS+, soft, nontender Derm: no cyanosis or rash Psyche: distant mood and affect     CBC    Component Value Date/Time   WBC 8.7 01/17/2018 0833   RBC 7.48 (H) 01/17/2018 0833   HGB 16.8 (H) 01/17/2018 0833   HCT 56.9 (H) 01/17/2018 0833   PLT 227 01/17/2018 0833   MCV 76.1 (L) 01/17/2018  0833   MCH 22.5 (L) 01/17/2018 0833   MCHC 29.5 (L) 01/17/2018 0833   RDW 23.2 (H) 01/17/2018 0833   LYMPHSABS 3.3 08/09/2017 1713   MONOABS 1.0 08/09/2017 1713   EOSABS 0.1 08/09/2017 1713   BASOSABS 0.0 08/09/2017 1713    Chest imaging: HRCT 12/2013:  Mild GG, centrilobular nodularity  PFT PFTs 2015:  FEV1 1.48 (56%), but normal ratio.  No BD response.  TLC 60%, mild airtrapping.  DLCO 45%, but corrects to normal with Av March 2017 pulmonary function testing ratio 85%, FVC 1.61 L (50% predicted) sees, FEV1 1.37 L (53% predicted), total lung capacity 3.14 L (58% predicted), DLCO 13.3 (49% predicted). July 2019   Path: Bronch 03/2014:  BAL with 85 WBC's, 21% eos.  TBBX with normal lung parenchyma, but not enough for possible specific diagnosis VATS BX 06/2014:  RB-ILD (severe), ??? Component NSIP included in the differential  Labs: HP panel negative 03/2014 Autoimmune labs normal 07/2014  6MW March 2017 6 minute walk distance 144 m, O2 saturation nadir 81% on room air  Records from her last visit with [place] reviewed where she was told to quit smoking and to take Symbicort      Assessment &  Plan:   Congestive heart failure, unspecified HF chronicity, unspecified heart failure type ([place]) - Plan: Basic Metabolic Panel (BMET)  Chronic respiratory failure with hypoxia ([place])  Need for immunization against influenza - Plan: Flu Vaccine QUAD 36+ mos IM  Respiratory bronchiolitis interstitial lung disease ([place])  Tobacco abuse  Discussion: From a pulmonary standpoint this has been a stable interval for her.  She has cut back smoking cigarettes to 1 cigarette a day which I think is likely the explanation for the improvement in her lung function testing this year.  It is not clear to me that she still needs to use 4 L of oxygen continuously so we will do a walking test today.  Plan: Respiratory bronchiolitis interstitial lung disease: Quit smoking altogether This year his lung  function test showed improvement which is due to the fact that you have cut back on cigarettes Flu shot today Practice good hand hygiene  Syncope: This is the medical term for passing out We will check your blood chemistry today to make sure there is no evidence of you being dehydrated If that test is normal then you should see your cardiologist  COPD: Continue Symbicort 2 puffs twice a day no matter how you feel  Chronic respiratory failure with hypoxemia: Use 2 L at rest, 4 L with exertion We will check your oxygen level while walking on room air today  Follow-up in 6 months or sooner if needed    Current Outpatient Medications:  .  acetaminophen (TYLENOL) 325 MG tablet, Take 650 mg by mouth every 6 (six) hours as needed for mild pain or headache., Disp: , Rfl:  .  albuterol (PROVENTIL HFA;VENTOLIN HFA) 108 (90 BASE) MCG/ACT inhaler, Inhale 2 puffs into the lungs every 6 (six) hours as needed for wheezing or shortness of breath., Disp: 1 Inhaler, Rfl: 3 .  amLODipine (NORVASC) 5 MG tablet, Take 1 tablet (5 mg total) by mouth daily., Disp: 30 tablet, Rfl: 1 .  budesonide-formoterol (SYMBICORT) 160-4.5 MCG/ACT inhaler, Inhale 2 puffs into the lungs 2 (two) times daily. (Patient taking differently: Inhale 2 puffs into the lungs daily as needed (shortness of breath/wheezing). ), Disp: 1 Inhaler, Rfl: 5 .  diclofenac sodium (VOLTAREN) 1 % GEL, Apply 2 g topically daily as needed (pain)., Disp: , Rfl:  .  lisinopril-hydrochlorothiazide (PRINZIDE,ZESTORETIC) 20-25 MG tablet, Take 1 tablet by mouth daily., Disp: 30 tablet, Rfl: 1 .  nicotine polacrilex (NICORETTE) 2 MG gum, Take 2 mg by mouth as needed for smoking cessation., Disp: , Rfl:  .  OXYGEN, Inhale 4 L into the lungs continuous. , Disp: , Rfl:  .  QUEtiapine (SEROQUEL) 300 MG tablet, Take 1 tablet (300 mg total) by mouth at bedtime., Disp: 30 tablet, Rfl: 1 .  rosuvastatin (CRESTOR) 10 MG tablet, Take 1 tablet (10 mg total) by mouth  daily., Disp: 30 tablet, Rfl: 1 .  tiZANidine (ZANAFLEX) 4 MG tablet, Take 4 mg by mouth 2 (two) times daily as needed for muscle spasms. , Disp: , Rfl:  .  traZODone (DESYREL) 100 MG tablet, Take 100 mg by mouth at bedtime., Disp: , Rfl:  .  furosemide (LASIX) 20 MG tablet, TAKE ONE TABLET BY MOUTH EVERY DAY (Patient not taking: Reported on 03/16/2018), Disp: 30 tablet, Rfl: 0 .  furosemide (LASIX) 40 MG tablet, Take 1 tablet (40 mg total) by mouth 2 (two) times daily. (Patient not taking: Reported on 03/16/2018), Disp: 60 tablet, Rfl: 2 .  Misc. Devices MISC, CPAP @@  Night, Disp: , Rfl:

## 2018-03-16 NOTE — Patient Instructions (Addendum)
Respiratory bronchiolitis interstitial lung disease: Quit smoking altogether This year his lung function test showed improvement which is due to the fact that you have cut back on cigarettes Flu shot today Practice good hand hygiene  COPD: Continue Symbicort 2 puffs twice a day no matter how you feel  Syncope: This is the medical term for passing out We will check your blood chemistry today to make sure there is no evidence of you being dehydrated If that test is normal then you should see your cardiologist  Chronic respiratory failure with hypoxemia: Use 2 L at rest, 4 L with exertion We will check your oxygen level while walking on room air today  Follow-up in 6 months or sooner if needed

## 2018-05-25 ENCOUNTER — Other Ambulatory Visit: Payer: Self-pay | Admitting: Pulmonary Disease

## 2018-06-13 ENCOUNTER — Other Ambulatory Visit: Payer: Self-pay | Admitting: Acute Care

## 2018-06-13 NOTE — Telephone Encounter (Signed)
BQ please advise on refill. Thanks! 

## 2018-07-27 ENCOUNTER — Other Ambulatory Visit: Payer: Self-pay | Admitting: Pulmonary Disease

## 2018-07-27 NOTE — Telephone Encounter (Signed)
Dr. Lake Bells, please advise if it is okay to refill pt's lasix. Thanks!

## 2018-08-02 ENCOUNTER — Emergency Department (HOSPITAL_COMMUNITY)
Admission: EM | Admit: 2018-08-02 | Discharge: 2018-08-02 | Disposition: A | Discharge status: Home / Self Care | Payer: Medicaid Other | Attending: Emergency Medicine | Admitting: Emergency Medicine

## 2018-08-02 ENCOUNTER — Encounter (HOSPITAL_COMMUNITY): Payer: Self-pay

## 2018-08-02 ENCOUNTER — Emergency Department (HOSPITAL_COMMUNITY): Payer: Medicaid Other

## 2018-08-02 DIAGNOSIS — R0789 Other chest pain: Secondary | ICD-10-CM | POA: Diagnosis not present

## 2018-08-02 DIAGNOSIS — I509 Heart failure, unspecified: Secondary | ICD-10-CM | POA: Insufficient documentation

## 2018-08-02 DIAGNOSIS — I110 Hypertensive heart disease with heart failure: Secondary | ICD-10-CM | POA: Insufficient documentation

## 2018-08-02 DIAGNOSIS — R0781 Pleurodynia: Secondary | ICD-10-CM

## 2018-08-02 DIAGNOSIS — R531 Weakness: Secondary | ICD-10-CM | POA: Diagnosis present

## 2018-08-02 DIAGNOSIS — F17210 Nicotine dependence, cigarettes, uncomplicated: Secondary | ICD-10-CM | POA: Insufficient documentation

## 2018-08-02 DIAGNOSIS — Z79899 Other long term (current) drug therapy: Secondary | ICD-10-CM | POA: Insufficient documentation

## 2018-08-02 DIAGNOSIS — R079 Chest pain, unspecified: Secondary | ICD-10-CM | POA: Diagnosis not present

## 2018-08-02 LAB — URINALYSIS, ROUTINE W REFLEX MICROSCOPIC
Bilirubin Urine: NEGATIVE
Glucose, UA: NEGATIVE mg/dL
Ketones, ur: 5 mg/dL — AB
Leukocytes, UA: NEGATIVE
Nitrite: NEGATIVE
Protein, ur: 100 mg/dL — AB
Specific Gravity, Urine: 1.019 (ref 1.005–1.030)
pH: 5 (ref 5.0–8.0)

## 2018-08-02 LAB — HEPATIC FUNCTION PANEL
ALT: 39 U/L (ref 0–44)
AST: 33 U/L (ref 15–41)
Albumin: 3.7 g/dL (ref 3.5–5.0)
Alkaline Phosphatase: 60 U/L (ref 38–126)
Bilirubin, Direct: 0.2 mg/dL (ref 0.0–0.2)
Indirect Bilirubin: 0.9 mg/dL (ref 0.3–0.9)
Total Bilirubin: 1.1 mg/dL (ref 0.3–1.2)
Total Protein: 7.5 g/dL (ref 6.5–8.1)

## 2018-08-02 LAB — BASIC METABOLIC PANEL
Anion gap: 13 (ref 5–15)
BUN: 18 mg/dL (ref 6–20)
CHLORIDE: 88 mmol/L — AB (ref 98–111)
CO2: 27 mmol/L (ref 22–32)
CREATININE: 1.07 mg/dL — AB (ref 0.44–1.00)
Calcium: 8.9 mg/dL (ref 8.9–10.3)
GFR calc Af Amer: >60 mL/min (ref >60)
GFR calc non Af Amer: >60 mL/min (ref >60)
GLUCOSE: 114 mg/dL — AB (ref 70–99)
Potassium: 4.9 mmol/L (ref 3.5–5.1)
SODIUM: 128 mmol/L — AB (ref 135–145)

## 2018-08-02 LAB — RAPID URINE DRUG SCREEN, HOSP PERFORMED
Amphetamines: NOT DETECTED
Barbiturates: NOT DETECTED
Benzodiazepines: NOT DETECTED
Cocaine: POSITIVE — AB
Opiates: NOT DETECTED
Tetrahydrocannabinol: NOT DETECTED

## 2018-08-02 LAB — I-STAT TROPONIN, ED
Troponin i, poc: 0.02 ng/mL (ref 0.00–0.08)
Troponin i, poc: 0 ng/mL (ref 0.00–0.08)

## 2018-08-02 LAB — CBC
HEMATOCRIT: 56.7 % — AB (ref 36.0–46.0)
Hemoglobin: 15.9 g/dL — ABNORMAL HIGH (ref 12.0–15.0)
MCH: 24 pg — ABNORMAL LOW (ref 26.0–34.0)
MCHC: 28 g/dL — AB (ref 30.0–36.0)
MCV: 85.6 fL (ref 80.0–100.0)
Platelets: 210 10*3/uL (ref 150–400)
RBC: 6.62 MIL/uL — ABNORMAL HIGH (ref 3.87–5.11)
RDW: 19.6 % — AB (ref 11.5–15.5)
WBC: 7.1 10*3/uL (ref 4.0–10.5)
nRBC: 2.4 % — ABNORMAL HIGH (ref 0.0–0.2)

## 2018-08-02 LAB — BRAIN NATRIURETIC PEPTIDE: B Natriuretic Peptide: 319.7 pg/mL — ABNORMAL HIGH (ref 0.0–100.0)

## 2018-08-02 LAB — LIPASE, BLOOD: Lipase: 23 U/L (ref 11–51)

## 2018-08-02 MED ORDER — IBUPROFEN 400 MG PO TABS
400.0000 mg | ORAL_TABLET | Freq: Once | ORAL | Status: AC | PRN
  Administered 2018-08-02: 400 mg via ORAL
  Filled 2018-08-02: qty 1

## 2018-08-02 MED ORDER — LIDOCAINE 5 % EX PTCH: 1.0000 | MEDICATED_PATCH | CUTANEOUS | 0 refills | Status: DC

## 2018-08-02 MED ORDER — ACETAMINOPHEN 325 MG PO TABS
650.0000 mg | ORAL_TABLET | Freq: Once | ORAL | Status: AC
  Administered 2018-08-02: 650 mg via ORAL
  Filled 2018-08-02: qty 2

## 2018-08-02 MED ORDER — SODIUM CHLORIDE 0.9% FLUSH
3.0000 mL | Freq: Once | INTRAVENOUS | Status: AC
  Administered 2018-08-02: 3 mL via INTRAVENOUS

## 2018-08-02 NOTE — ED Notes (Signed)
Patient verbalizes understanding of discharge instructions. Opportunity for questioning and answers were provided. Armband removed by staff, pt discharged from ED in wheelchair.  

## 2018-08-02 NOTE — ED Notes (Signed)
Pt ambulated and stated her legs felt tight and she felt dizzy.

## 2018-08-02 NOTE — ED Triage Notes (Addendum)
Pt reports generalized weakness x several months that has worsened over past week. Pt reports she has worsened pain to L ribcage where she had lung biopsy 4 years ago. Pt on chronic O2 2-4L.

## 2018-08-02 NOTE — ED Provider Notes (Signed)
[facility] EMERGENCY DEPARTMENT Provider Note   CSN: [id number] Arrival date & time: 08/02/18  1102     History   Chief Complaint Chief Complaint  Patient presents with  . Weakness    HPI Diane Gilbert is a 53 y.o. female with a PMH of CHF, HTN, interstitial lung disease, COPD, and Schizophrenia presents with generalized weakness onset several months, but worsened over the past week. Patient uses 2-4L of O2 at home at rest and with ambulation. Patient reports chronic shortness of breath. Patient denies worsening shortness of breath. Patient states she had intermittent left sided non radiating chest pain this morning. Patient describes pain as a "bubble" and states symptoms resolved on its own. Patient states her last BM was today. Patient also reports atraumatic left rib cage pain onset 1.5 months ago. Patient reports movement makes her rib cage pain worse and ibuprofen relieves her pain. Patient's cardiologist is Dr. Einar Gip. Last echo was done in 08/11/17 and reveals EF of 60-65%. Last cath was done on 01/04/18 and reveals normal coronary arteries. Patient reports crack cocaine use and states last use was yesterday. Patient reports daily tobacco and alcohol use.  Patient reports nausea, vomiting, and diarrhea onset 1 week ago. Patient reports 4 episodes this morning. Patient denies abdominal pain. Patient denies taking anything for her symptoms. Patient denies fever, chills, cough, congestion, or rhinorrhea. Patient denies leg pain/edema, hx DVT/PE, recent surgery, or recent travel.   HPI  Past Medical History:  Diagnosis Date  . Alcohol abuse   . Anxiety   . Arthritis   . CHF (congestive heart failure) ([place])   . Depression   . Headache(784.0)   . Hypertension   . Insomnia   . Interstitial lung disease ([place])   . Oxygen dependent   . Schizophrenia ([place])   . Shortness of breath dyspnea   . Sleep apnea   . Sleep apnea     Patient Active Problem List   Diagnosis Date Noted  . Tobacco abuse 01/12/2018  . Dyspnea on exertion 01/04/2018  . Acute diastolic heart failure ([place]) 07/24/2017  . Acute on chronic diastolic CHF (congestive heart failure) ([place]) 05/09/2017  . Respiratory failure, acute and chronic ([place]) 05/09/2017  . Facial swelling 03/23/2017  . Chronic respiratory failure with hypoxia ([place]) 03/23/2017  . Acute respiratory failure with hypoxia ([place]) 03/23/2017  . Community acquired pneumonia   . OSA (obstructive sleep apnea) 07/21/2016  . Increased oxygen demand 07/04/2016  . Acute diastolic congestive heart failure ([place])   . Hypoxia 03/24/2016  . Schizophrenia ([place]) 03/24/2016  . Leg swelling 01/16/2016  . Dyspnea 09/06/2015  . Acute on chronic respiratory failure with hypoxia ([place]) 06/07/2014  . Respiratory bronchiolitis interstitial lung disease ([place]) 12/26/2013  . Irregular menstrual cycle 01/23/2013  . Smoking addiction 01/23/2013  . Alcohol abuse 02/19/2012  . Hyponatremia 02/19/2012  . Abdominal pain 02/19/2012  . Hypokalemia 02/19/2012    Past Surgical History:  Procedure Laterality Date  . BACK SURGERY    . LUNG BIOPSY Left 07/16/2014   Procedure: LUNG BIOPSY;  Surgeon: Grace Isaac, MD;  Location: [facility];  Service: Thoracic;  Laterality: Left;  Marland Kitchen MULTIPLE TOOTH EXTRACTIONS    . RIGHT/LEFT HEART CATH AND CORONARY ANGIOGRAPHY N/A 01/04/2018   Procedure: RIGHT/LEFT HEART CATH AND CORONARY ANGIOGRAPHY;  Surgeon: Adrian Prows, MD;  Location: [facility];  Service: Cardiovascular;  Laterality: N/A;  . VIDEO ASSISTED THORACOSCOPY Left 07/16/2014   Procedure: VIDEO ASSISTED THORACOSCOPY;  Surgeon: Grace Isaac, MD;  Location: [facility];  Service: Thoracic;  Laterality: Left;  Marland Kitchen VIDEO BRONCHOSCOPY Bilateral 04/11/2014   Procedure: VIDEO BRONCHOSCOPY WITH FLUORO;  Surgeon: Kathee Delton, MD;  Location: [facility];  Service: Cardiopulmonary;  Laterality: Bilateral;  . VIDEO BRONCHOSCOPY N/A 07/16/2014   Procedure:  VIDEO BRONCHOSCOPY;  Surgeon: Grace Isaac, MD;  Location: [facility];  Service: Thoracic;  Laterality: N/A;  . WEDGE RESECTION Left 07/16/2014   Procedure: WEDGE RESECTION;  Surgeon: Grace Isaac, MD;  Location: [facility] OR;  Service: Thoracic;  Laterality: Left;  left upper lobe lung resection     OB History    Gravida  0   Para  0   Term  0   Preterm  0   AB  0   Living  0     SAB  0   TAB  0   Ectopic  0   Multiple  0   Live Births               Home Medications    Prior to Admission medications   Medication Sig Start Date End Date Taking? Authorizing Provider  acetaminophen (TYLENOL) 325 MG tablet Take 650 mg by mouth every 6 (six) hours as needed for mild pain or headache.    [provider]  albuterol (PROVENTIL HFA;VENTOLIN HFA) 108 (90 BASE) MCG/ACT inhaler Inhale 2 puffs into the lungs every 6 (six) hours as needed for wheezing or shortness of breath. 05/09/15   Juanito Doom, MD  amLODipine (NORVASC) 5 MG tablet Take 1 tablet (5 mg total) by mouth daily. 01/17/18   Blanchie Dessert, MD  diclofenac sodium (VOLTAREN) 1 % GEL Apply 2 g topically daily as needed (pain).    [provider]  furosemide (LASIX) 20 MG tablet TAKE ONE TABLET BY MOUTH EVERY DAY 06/17/18   Juanito Doom, MD  furosemide (LASIX) 40 MG tablet Take 1 tablet (40 mg total) by mouth 2 (two) times daily. Patient not taking: Reported on 03/16/2018 11/10/17   Juanito Doom, MD  lidocaine (LIDODERM) 5 % Place 1 patch onto the skin daily. Remove & Discard patch within 12 hours or as directed by MD 08/02/18   Darlin Drop P, PA-C  lisinopril-hydrochlorothiazide (PRINZIDE,ZESTORETIC) 20-25 MG tablet Take 1 tablet by mouth daily. 01/17/18   Blanchie Dessert, MD  Misc. Devices MISC CPAP @@ Night    [provider]  nicotine polacrilex (NICORETTE) 2 MG gum Take 2 mg by mouth as needed for smoking cessation.    [provider]  OXYGEN Inhale 4 L into the  lungs continuous.     [provider]  QUEtiapine (SEROQUEL) 300 MG tablet Take 1 tablet (300 mg total) by mouth at bedtime. 06/18/17   Arfeen, Arlyce Harman, MD  rosuvastatin (CRESTOR) 10 MG tablet Take 1 tablet (10 mg total) by mouth daily. 01/17/18   Blanchie Dessert, MD  SYMBICORT 160-4.5 MCG/ACT inhaler INHALE TWO puffs into THE lungs TWICE DAILY 05/27/18   Juanito Doom, MD  tiZANidine (ZANAFLEX) 4 MG tablet Take 4 mg by mouth 2 (two) times daily as needed for muscle spasms.     [provider]  traZODone (DESYREL) 100 MG tablet Take 100 mg by mouth at bedtime.    [provider]    Family History Family History  Problem Relation Age of Onset  . Breast cancer Mother   . Obstructive Sleep Apnea Mother   . COPD Maternal  Aunt     Social History Social History   Tobacco Use  . Smoking status: Current Every Day Smoker    Packs/day: 1.50    Years: 30.00    Pack years: 45.00    Types: Cigarettes  . Smokeless tobacco: Never Used  . Tobacco comment: smoking 3 cigarettes daily "every now and then" 11/04/16  Substance Use Topics  . Alcohol use: No    Alcohol/week: 0.0 standard drinks    Frequency: Never    Comment: Pt has previously abused alcohol , pt states she still drinks "every now and then"  . Drug use: Yes    Types: Cocaine     Allergies   Contrast media [iodinated diagnostic agents]   Review of Systems Review of Systems  Constitutional: Negative for activity change, appetite change, chills, diaphoresis, fatigue, fever and unexpected weight change.  HENT: Negative for congestion and rhinorrhea.   Respiratory: Positive for shortness of breath (Pt reports chronic shortness of breath.). Negative for cough, chest tightness and wheezing.   Cardiovascular: Positive for chest pain. Negative for palpitations and leg swelling.  Gastrointestinal: Positive for diarrhea, nausea and vomiting. Negative for abdominal pain and constipation.  Endocrine:  Negative for cold intolerance and heat intolerance.  Genitourinary: Negative for dysuria and frequency.  Musculoskeletal: Positive for arthralgias (Pt reports left sided rib pain. ). Negative for back pain, gait problem and myalgias.  Skin: Negative for rash.  Neurological: Positive for weakness. Negative for dizziness, syncope, light-headedness and numbness.  Psychiatric/Behavioral: Negative for agitation and behavioral problems. The patient is not nervous/anxious.     Physical Exam Updated Vital Signs BP (!) 169/93   Pulse 95   Temp 98.4 F (36.9 C) (Oral)   Resp 16   LMP 04/06/2017   SpO2 96%   Physical Exam Vitals signs and nursing note reviewed.  Constitutional:      General: She is not in acute distress.    Appearance: She is well-developed. She is not diaphoretic.  HENT:     Head: Normocephalic and atraumatic.     Right Ear: Tympanic membrane, ear canal and external ear normal.     Left Ear: Tympanic membrane, ear canal and external ear normal.     Nose: Nose normal. No congestion or rhinorrhea.     Mouth/Throat:     Mouth: Mucous membranes are moist.     Pharynx: No oropharyngeal exudate or posterior oropharyngeal erythema.  Eyes:     Extraocular Movements: Extraocular movements intact.     Conjunctiva/sclera: Conjunctivae normal.     Pupils: Pupils are equal, round, and reactive to light.  Neck:     Musculoskeletal: Normal range of motion and neck supple.     Vascular: No JVD.  Cardiovascular:     Rate and Rhythm: Normal rate and regular rhythm.     Pulses: Normal pulses.          Radial pulses are 2+ on the right side and 2+ on the left side.       Dorsalis pedis pulses are 2+ on the right side and 2+ on the left side.     Heart sounds: Normal heart sounds. No murmur. No friction rub. No gallop.   Pulmonary:     Effort: Pulmonary effort is normal. No respiratory distress.     Breath sounds: Normal breath sounds. No wheezing or rales.  Chest:     Chest wall:  Tenderness present.  Abdominal:     Palpations: Abdomen is soft.  Tenderness: There is no abdominal tenderness. There is no guarding or rebound.  Musculoskeletal: Normal range of motion.       Arms:     Right lower leg: No edema.     Left lower leg: No edema.  Skin:    General: Skin is warm.     Capillary Refill: Capillary refill takes less than 2 seconds.     Coloration: Skin is not pale.     Findings: No rash.  Neurological:     Mental Status: She is alert and oriented to person, place, and time.    Mental Status:  Alert, oriented, thought content appropriate, able to give a coherent history. Speech fluent without evidence of aphasia. Able to follow 2 step commands without difficulty.  Cranial Nerves:  II:  Peripheral visual fields grossly normal, pupils equal, round, reactive to light III,IV, VI: ptosis not present, extra-ocular motions intact bilaterally  V,VII: smile symmetric, facial light touch sensation equal VIII: hearing grossly normal to voice  X: uvula elevates symmetrically  XI: bilateral shoulder shrug symmetric and strong XII: midline tongue extension without fassiculations Motor:  Normal tone. 5/5 in upper and lower extremities bilaterally including strong and equal grip strength and dorsiflexion/plantar flexion Sensory: light touch normal in all extremities.  Deep Tendon Reflexes: 2+ and symmetric in the biceps and patella Cerebellar: normal finger-to-nose with bilateral upper extremities Gait: normal gait and balance.  Negative pronator drift. Negative Romberg sign. CV: distal pulses palpable throughout    ED Treatments / Results  Labs (all labs ordered are listed, but only abnormal results are displayed) Labs Reviewed  BASIC METABOLIC PANEL - Abnormal; Notable for the following components:      Result Value   Sodium 128 (*)    Chloride 88 (*)    Glucose, Bld 114 (*)    Creatinine, Ser 1.07 (*)    All other components within normal limits  CBC -  Abnormal; Notable for the following components:   RBC 6.62 (*)    Hemoglobin 15.9 (*)    HCT 56.7 (*)    MCH 24.0 (*)    MCHC 28.0 (*)    RDW 19.6 (*)    nRBC 2.4 (*)    All other components within normal limits  RAPID URINE DRUG SCREEN, HOSP PERFORMED - Abnormal; Notable for the following components:   Cocaine POSITIVE (*)    All other components within normal limits  BRAIN NATRIURETIC PEPTIDE - Abnormal; Notable for the following components:   B Natriuretic Peptide 319.7 (*)    All other components within normal limits  URINALYSIS, ROUTINE W REFLEX MICROSCOPIC - Abnormal; Notable for the following components:   APPearance CLOUDY (*)    Hgb urine dipstick SMALL (*)    Ketones, ur 5 (*)    Protein, ur 100 (*)    Bacteria, UA RARE (*)    All other components within normal limits  URINE CULTURE  LIPASE, BLOOD  HEPATIC FUNCTION PANEL  I-STAT TROPONIN, ED  I-STAT TROPONIN, ED    EKG EKG Interpretation  Date/Time:  Tuesday August 02 2018 10:29:30 EST Ventricular Rate:  92 PR Interval:  176 QRS Duration: 84 QT Interval:  378 QTC Calculation: 467 R Axis:   128 Text Interpretation:  Normal sinus rhythm Right atrial enlargement Right axis deviation Anterior infarct , age undetermined Abnormal ECG Confirmed by Julianne Rice [phone]) on 08/02/2018 6:25:25 PM   Radiology Dg Chest 2 View  Result Date: 08/02/2018 CLINICAL DATA:  Shortness of breath. EXAM:  CHEST - 2 VIEW COMPARISON:  01/17/2018 FINDINGS: There is new cardiomegaly with pulmonary vascular congestion and faint pulmonary edema bilaterally. No effusions. No bone abnormality. IMPRESSION: Findings consistent with mild congestive heart failure. Electronically Signed   By: Lorriane Shire M.D.   On: 08/02/2018 10:45    Procedures Procedures (including critical care time)  Medications Ordered in ED Medications  sodium chloride flush (NS) 0.9 % injection 3 mL (3 mLs Intravenous Given 08/02/18 1552)  ibuprofen (ADVIL,MOTRIN)  tablet 400 mg (400 mg Oral Given 08/02/18 1240)  acetaminophen (TYLENOL) tablet 650 mg (650 mg Oral Given 08/02/18 1854)     Initial Impression / Assessment and Plan / ED Course  I have reviewed the triage vital signs and the nursing notes.  Pertinent labs & imaging results that were available during my care of the patient were reviewed by me and considered in my medical decision making (see chart for details).  Clinical Course as of Aug 03 2055  Tue Aug 02, 2018  1535 There is cardiomegaly with pulmonary vascular congestion and faint pulmonary edema bilaterally. No effusions. Findings consistent with mild CHF.     DG Chest 2 View [AH]  1653 WBCs are within normal limits.  WBC: 7.1 [AH]  1825 WBCs and bacteria noted on UA. Will order urine culture.   WBC, UA: 6-10 [AH]  1831 BNP elevated at 319.   B Natriuretic Peptide(!): 319.7 [AH]  1831 Hyponatremia noted at 128. Will not provide IVF at this time due to concern that this will exacerbate CHF.   Sodium(!): 128 [AH]  1948 Pt reports left rib pain has improved while in the ER.    [AH]  1948 Patient denies abdominal pain, nausea, vomiting, or diarrhea while in the ER.   [AH]  2015 Discussed elevated BP with patient. Patient states she has not been compliant with her BP medicines for 1 year. Patient denies any symptoms. Encouraged patient to follow up with PCP regarding elevated BP. Patient states she has an appointment scheduled.   BP(!): 154/94 [AH]    Clinical Course User Index [AH] Arville Lime, PA-C   Patient is to be discharged with recommendation to follow up with PCP in regards to today's hospital visit. Chest pain is not likely of cardiac or pulmonary etiology d/t presentation, PERC negative, VSS, no tracheal deviation, no JVD or new murmur, RRR, breath sounds equal bilaterally, EKG without acute abnormalities when compared to previous EKGs, negative troponin x 2, and CXR reveals cardiomegaly with pulmonary vascular congestion  consistent with mild CHF. Patient is able to ambulate without difficulty. Suspect rib pain is musculoskeletal due to history and physical exam. Rib pain improved with tylenol while in the ER.  Pt has been advised to return to the ED if CP becomes exertional, associated with diaphoresis or nausea, radiates to left jaw/arm, worsens or becomes concerning in any way. Advised patient to stop using crack cocaine. Patient states she understands. Pt appears reliable for follow up and is agreeable to discharge.   Case has been discussed with and seen by Dr. Lita Mains who agrees with the above plan to discharge.   Final Clinical Impressions(s) / ED Diagnoses   Final diagnoses:  Generalized weakness  Left-sided chest pain  Rib pain on left side    ED Discharge Orders         Ordered    lidocaine (LIDODERM) 5 %  Every 24 hours     08/02/18 2048  Arville Lime, [place] 08/02/18 2057    Julianne Rice, MD 08/04/18 [phone]

## 2018-08-02 NOTE — Discharge Instructions (Signed)
You have been seen today for generalized weakness, chest pain, and rib pain. Please read and follow all provided instructions.   1. Medications: tylenol for rib pain, lidoderm patch for rib pain, usual home medications 2. Treatment: rest, drink plenty of fluids 3. Follow Up: Please follow up with your primary doctor in 2 days for discussion of your diagnoses and further evaluation after today's visit; if you do not have a primary care doctor use the resource guide provided to find one; Please return to the ER for any new or worsening symptoms. Please obtain all of your results from medical records or have your doctors office obtain the results - share them with your doctor - you should be seen at your doctors office. Call today to arrange your follow up.   Take medications as prescribed. Please review all of the medicines and only take them if you do not have an allergy to them. Return to the emergency room for worsening condition or new concerning symptoms. Follow up with your regular doctor. If you don't have a regular doctor use one of the numbers below to establish a primary care doctor.  Please be aware that if you are taking birth control pills, taking other prescriptions, ESPECIALLY ANTIBIOTICS may make the birth control ineffective - if this is the case, either do not engage in sexual activity or use alternative methods of birth control such as condoms until you have finished the medicine and your family doctor says it is OK to restart them. If you are on a blood thinner such as COUMADIN, be aware that any other medicine that you take may cause the coumadin to either work too much, or not enough - you should have your coumadin level rechecked in next 7 days if this is the case.  ?  It is also a possibility that you have an allergic reaction to any of the medicines that you have been prescribed - Everybody reacts differently to medications and while MOST people have no trouble with most medicines,  you may have a reaction such as nausea, vomiting, rash, swelling, shortness of breath. If this is the case, please stop taking the medicine immediately and contact your physician.  ?  You should return to the ER if you develop severe or worsening symptoms.   Emergency Department Resource Guide 1) Find a Doctor and Pay Out of Pocket Although you won't have to find out who is covered by your insurance plan, it is a good idea to ask around and get recommendations. You will then need to call the office and see if the doctor you have chosen will accept you as a new patient and what types of options they offer for patients who are self-pay. Some doctors offer discounts or will set up payment plans for their patients who do not have insurance, but you will need to ask so you aren't surprised when you get to your appointment.  2) Contact Your Local Health Department Not all health departments have doctors that can see patients for sick visits, but many do, so it is worth a call to see if yours does. If you don't know where your local health department is, you can check in your phone book. The CDC also has a tool to help you locate your state's health department, and many state websites also have listings of all of their local health departments.  3) Find a [facility] If your illness is not likely to be very severe or complicated, you  may want to try a walk in clinic. These are popping up all over the country in pharmacies, drugstores, and shopping centers. They're usually staffed by nurse practitioners or physician assistants that have been trained to treat common illnesses and complaints. They're usually fairly quick and inexpensive. However, if you have serious medical issues or chronic medical problems, these are probably not your best option.  No Primary Care Doctor: Call Health Connect at  [phone] - they can help you locate a primary care doctor that  accepts your insurance, provides certain  services, etc. Physician Referral [phone]  Emergency Department Resource Guide 1) Find a Doctor and Pay Out of Pocket Although you won't have to find out who is covered by your insurance plan, it is a good idea to ask around and get recommendations. You will then need to call the office and see if the doctor you have chosen will accept you as a new patient and what types of options they offer for patients who are self-pay. Some doctors offer discounts or will set up payment plans for their patients who do not have insurance, but you will need to ask so you aren't surprised when you get to your appointment.  2) Contact Your Local Health Department Not all health departments have doctors that can see patients for sick visits, but many do, so it is worth a call to see if yours does. If you don't know where your local health department is, you can check in your phone book. The CDC also has a tool to help you locate your state's health department, and many state websites also have listings of all of their local health departments.  3) Find a [facility] If your illness is not likely to be very severe or complicated, you may want to try a walk in clinic. These are popping up all over the country in pharmacies, drugstores, and shopping centers. They're usually staffed by nurse practitioners or physician assistants that have been trained to treat common illnesses and complaints. They're usually fairly quick and inexpensive. However, if you have serious medical issues or chronic medical problems, these are probably not your best option.  No Primary Care Doctor: Call Health Connect at  [phone] - they can help you locate a primary care doctor that  accepts your insurance, provides certain services, etc. Physician Referral Service- [phone]  Chronic Pain Problems: Organization         Address  Phone   Notes  [facility]  [phone] Patients need to be referred  by their primary care doctor.   Medication Assistance: Organization         Address  Phone   Notes  [facility] Medication [facility] [address] [place], [place] [address] [phone] --Must be a resident of [facility] -- Must have NO insurance coverage whatsoever (no Medicaid/ Medicare, etc.) -- The pt. MUST have a primary care doctor that directs their care regularly and follows them in the community   [facility]  [phone]   [employer]  [phone]    Agencies that provide inexpensive medical care: Organization         Address  Phone   Notes  [place]  [phone]   [facility]    [phone]   [facility] [place], [place] [address] [phone]   [place] (  [phone]   [facility]    [phone]   [facility]    [phone]   [place] [address], [place] Phone:  [phone], Fax:  [phone] Hours of Operation:  9 am - 6 pm, M-F.  Also accepts Medicaid/Medicare and self-pay.  [facility] for [place] [address], [address], [place] Phone: [phone], Fax: [phone]. Hours of Operation:  8:30 am - 5:30 pm, M-F.  Also accepts Medicaid and self-pay.  [facility] [place] [address], [place] Phone: [phone]   [place], [place], [place] [phone], Ext. [phone] Mondays & Thursdays: 7-9 AM.  First 15 patients are seen on a first come, first serve basis.    [place] Providers:  Organization         Address  Phone   Notes  [facility] [address], [place] [phone] Also accepts self-pay patients.  [facility] [address], [place]  [phone]   [place], [address], [place] [phone]   [facility] [address] [place] [phone]   Lucianne Lei [address], [place]   [phone] Only accepts [place] [facility] [place] patients after they have their name applied to their card.   Self-Pay (no insurance) in [facility]:  Organization         Address  Phone   Notes  Sickle Cell Patients, [facility] [place] [phone]   [facility] [phone]   [facility] [place]  [place], [place], [place] [phone]   [facility]/Dr. Osei-Bonsu  [address] [place] or [address], [place] [phone] Phone number for both [place] and [place] locations is the same.  [facility] [address], [place] [phone]   [facility] [address], [place] or [address] [phone]   [facility] [address] [place] [phone], phone; [phone], fax Sees patients 1st and 3rd Saturday of every month.  Must not qualify for public or private insurance (i.e. Medicaid, Medicare, [place] Health Choice, Veterans' Benefits)  Household income should be no more than 200% of the poverty level The clinic cannot treat you if you are pregnant or think you are pregnant  Sexually transmitted diseases are not treated at the clinic.

## 2018-08-03 LAB — URINE CULTURE: Culture: NO GROWTH

## 2018-08-26 ENCOUNTER — Encounter: Payer: Self-pay | Admitting: Cardiology

## 2018-08-26 ENCOUNTER — Ambulatory Visit (INDEPENDENT_AMBULATORY_CARE_PROVIDER_SITE_OTHER): Payer: Medicaid Other | Admitting: Cardiology

## 2018-08-26 VITALS — BP 121/81 | HR 108 | Ht 64.0 in | Wt 210.0 lb

## 2018-08-26 DIAGNOSIS — F1410 Cocaine abuse, uncomplicated: Secondary | ICD-10-CM

## 2018-08-26 DIAGNOSIS — J849 Interstitial pulmonary disease, unspecified: Secondary | ICD-10-CM

## 2018-08-26 DIAGNOSIS — I2723 Pulmonary hypertension due to lung diseases and hypoxia: Secondary | ICD-10-CM

## 2018-08-26 DIAGNOSIS — I5032 Chronic diastolic (congestive) heart failure: Secondary | ICD-10-CM

## 2018-08-26 DIAGNOSIS — J432 Centrilobular emphysema: Secondary | ICD-10-CM | POA: Diagnosis not present

## 2018-08-26 DIAGNOSIS — I10 Essential (primary) hypertension: Secondary | ICD-10-CM

## 2018-08-26 DIAGNOSIS — J449 Chronic obstructive pulmonary disease, unspecified: Secondary | ICD-10-CM

## 2018-08-26 DIAGNOSIS — J9611 Chronic respiratory failure with hypoxia: Secondary | ICD-10-CM | POA: Diagnosis not present

## 2018-08-26 MED ORDER — FUROSEMIDE 40 MG PO TABS: 40.0000 mg | ORAL_TABLET | Freq: Every morning | ORAL | Status: DC

## 2018-08-26 NOTE — Progress Notes (Signed)
Subjective:  Primary Physician:  Nolene Ebbs, MD  Patient ID: Diane Gilbert, female    DOB: 12/06/1965, 53 y.o.   MRN: [id number]  Chief Complaint  Patient presents with  . Congestive Heart Failure    F/U    HPI: Diane Gilbert  is a 53 y.o. female  with severe COPD and oxygen dependent, hypertension, respiratory bronchiolitis with interstitial lung disease, chronic diastolic heart failure, obstructive sleep apnea noncompliant with CPAP, ongoing tobacco use disorder, due to worsening dyspnea she underwent right andleft heart cath in July 2017 and found to have no CAD, mild PH now presents for f/u of chronic diastolic CHF. She was seen in the ED last week and CXR revealed cardiomegaly and CHF. Urine Cocaine positive  Past Medical History:  Diagnosis Date  . Acute on chronic respiratory failure with hypoxia ([place]) 06/07/2014  . Acute respiratory failure with hypoxia ([place]) 03/23/2017  . Alcohol abuse   . Anxiety   . Arthritis   . CHF (congestive heart failure) ([place])   . Community acquired pneumonia   . Depression   . Headache(784.0)   . Hypertension   . Insomnia   . Interstitial lung disease ([place])   . Oxygen dependent   . Respiratory failure, acute and chronic ([place]) 05/09/2017  . Schizophrenia ([place])   . Shortness of breath dyspnea   . Sleep apnea   . Sleep apnea     Past Surgical History:  Procedure Laterality Date  . BACK SURGERY    . LUNG BIOPSY Left 07/16/2014   Procedure: LUNG BIOPSY;  Surgeon: Grace Isaac, MD;  Location: [facility];  Service: Thoracic;  Laterality: Left;  Marland Kitchen MULTIPLE TOOTH EXTRACTIONS    . RIGHT/LEFT HEART CATH AND CORONARY ANGIOGRAPHY N/A 01/04/2018   Procedure: RIGHT/LEFT HEART CATH AND CORONARY ANGIOGRAPHY;  Surgeon: Adrian Prows, MD;  Location: [facility];  Service: Cardiovascular;  Laterality: N/A;  . VIDEO ASSISTED THORACOSCOPY Left 07/16/2014   Procedure: VIDEO ASSISTED THORACOSCOPY;  Surgeon: Grace Isaac, MD;  Location:  [facility];  Service: Thoracic;  Laterality: Left;  Marland Kitchen VIDEO BRONCHOSCOPY Bilateral 04/11/2014   Procedure: VIDEO BRONCHOSCOPY WITH FLUORO;  Surgeon: Kathee Delton, MD;  [facility];  Service: Cardiopulmonary;  Laterality: Bilateral;  . VIDEO BRONCHOSCOPY N/A 07/16/2014   Procedure: VIDEO BRONCHOSCOPY;  Surgeon: Grace Isaac, MD;  Location[facility];  Service: Thoracic;  Laterality: N/A;  . WEDGE RESECTION Left 07/16/2014   Procedure: WEDGE RESECTION;  Surgeon: Grace Isaac, MD;  Location: [facility];  Service: Thoracic;  Laterality: Left;  left upper lobe lung resection    Social History   Socioeconomic History  . Marital status: Single    Spouse name: Not on file  . Number of children: Not on file  . Years of education: Not on file  . Highest education level: Not on file  Occupational History  . Occupation: unemployed  Social Needs  . Financial resource strain: Not on file  . Food insecurity:    Worry: Not on file    Inability: Not on file  . Transportation needs:    Medical: Not on file    Non-medical: Not on file  Tobacco Use  . Smoking status: Current Every Day Smoker    Packs/day: 1.50    Years: 30.00    Pack years: 45.00    Types: Cigarettes  . Smokeless tobacco: Never Used  . Tobacco comment: smoking 3 cigarettes daily "every now and then" 11/04/16  Substance and Sexual Activity  . Alcohol use: Yes    Alcohol/week: 0.0 standard drinks    Frequency: Never    Comment: occasional   . Drug use: Yes    Types: Cocaine  . Sexual activity: Not Currently    Comment: husband locked up   Lifestyle  . Physical activity:    Days per week: Not on file    Minutes per session: Not on file  . Stress: Not on file  Relationships  . Social connections:    Talks on phone: Not on file    Gets together: Not on file    Attends religious service: Not on file    Active member of club or organization: Not on file    Attends meetings of clubs or organizations: Not on file     Relationship status: Not on file  . Intimate partner violence:    Fear of current or ex partner: Not on file    Emotionally abused: Not on file    Physically abused: Not on file    Forced sexual activity: Not on file  Other Topics Concern  . Not on file  Social History Narrative  . Not on file    Current Outpatient Medications on File Prior to Visit  Medication Sig Dispense Refill  . acetaminophen (TYLENOL) 325 MG tablet Take 650 mg by mouth every 6 (six) hours as needed for mild pain or headache.    . albuterol (PROVENTIL HFA;VENTOLIN HFA) 108 (90 BASE) MCG/ACT inhaler Inhale 2 puffs into the lungs every 6 (six) hours as needed for wheezing or shortness of breath. 1 Inhaler 3  . amLODipine (NORVASC) 5 MG tablet Take 1 tablet (5 mg total) by mouth daily. 30 tablet 1  . diclofenac sodium (VOLTAREN) 1 % GEL Apply 2 g topically daily as needed (pain).    . furosemide (LASIX) 20 MG tablet TAKE ONE TABLET BY MOUTH EVERY DAY (Patient taking differently: Take 20 mg by mouth every morning. ) 30 tablet 0  . furosemide (LASIX) 40 MG tablet Take 1 tablet (40 mg total) by mouth 2 (two) times daily. (Patient taking differently: Take 40 mg by mouth every evening. ) 60 tablet 2  . Misc. Devices MISC CPAP @@ Night    . nicotine polacrilex (NICORETTE) 2 MG gum Take 2 mg by mouth as needed for smoking cessation.    . OXYGEN Inhale 4 L into the lungs continuous.     Marland Kitchen QUEtiapine (SEROQUEL) 300 MG tablet Take 1 tablet (300 mg total) by mouth at bedtime. (Patient taking differently: Take 300 mg by mouth at bedtime. 100 mg in the morning and 200 mg in the evening.) 30 tablet 1  . SYMBICORT 160-4.5 MCG/ACT inhaler INHALE TWO puffs into THE lungs TWICE DAILY 10.2 g 5  . tiZANidine (ZANAFLEX) 4 MG tablet Take 4 mg by mouth 2 (two) times daily as needed for muscle spasms.     . traZODone (DESYREL) 100 MG tablet Take 100 mg by mouth at bedtime.    . lidocaine (LIDODERM) 5 % Place 1 patch onto the skin daily. Remove  & Discard patch within 12 hours or as directed by MD (Patient not taking: Reported on 08/26/2018) 30 patch 0  . lisinopril-hydrochlorothiazide (PRINZIDE,ZESTORETIC) 20-25 MG tablet Take 1 tablet by mouth daily. (Patient not taking: Reported on 08/26/2018) 30 tablet 1  . rosuvastatin (CRESTOR) 10 MG tablet Take 1 tablet (10 mg total) by mouth daily. (Patient not taking: Reported on 08/26/2018) 30 tablet 1  No current facility-administered medications on file prior to visit.      Review of Systems  Constitutional: Negative for malaise/fatigue and weight loss.  Respiratory: Positive for cough and shortness of breath. Negative for hemoptysis.   Cardiovascular: Negative for chest pain, palpitations, claudication and leg swelling.  Gastrointestinal: Negative for abdominal pain, blood in stool, constipation, heartburn and vomiting.  Genitourinary: Negative for dysuria.  Musculoskeletal: Positive for joint pain. Negative for myalgias.  Neurological: Negative for dizziness, focal weakness and headaches.  Endo/Heme/Allergies: Does not bruise/bleed easily.  Psychiatric/Behavioral: Negative for depression. The patient is nervous/anxious.   All other systems reviewed and are negative.     Objective:  Height _0  (1.626 m), last menstrual period 04/06/2017. Body mass index is 32.61 kg/m.  Physical Exam  Constitutional: She appears well-developed and well-nourished. No distress.  HENT:  Head: Atraumatic.  Eyes: Conjunctivae are normal.  Neck: Neck supple. No JVD present. No thyromegaly present.  Cardiovascular: Normal rate, regular rhythm, normal heart sounds and intact distal pulses. Exam reveals no gallop.  No murmur heard. Pulmonary/Chest: Tachypnea (mild) noted. Respiratory distress: diffuse. She has wheezes. She has rales (diffuse).  Abdominal: Soft. Bowel sounds are normal.  Musculoskeletal: Normal range of motion.        General: No edema.  Neurological: She is alert.  Skin: Skin is warm  and dry.  Psychiatric: She has a normal mood and affect.    CARDIAC STUDIES:   Right and left heart catheterization 01/04/2018: RA 6/5/4 mmHg; RV 36/3, EDP 7 mmHg; PA 33/23, mean 28 mmHg.  PA saturation 74%.;  PW 6/6, mean 5 mmHg.  Aortic saturation 95%. Cardiac output 4.76, cardiac index 2.52 by Fick.  QP/QS 1.0.  PVR 387 dynes per second.  Left heart catheterization: LV 123/4, EDP 6 mmHg.  Aortic pressure 136/80, mean 102 mmHg.  No LV to aortic pressure gradient. Normal coronary arteries, right dominant circulation.  Echocardiogram 07/09/2017: Normal LV systolic function, EF 75-91%, mildly thickened mitral valve leaflets.  Moderately dilated right ventricle, severe pulmonary hypertension with mild TR. Assessment & Recommendations:  1. Chronic respiratory failure with hypoxia (H[facility])  2. 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 Pure hypercholesterolemia  4. Essential hypertension  5. Centrilobular emphysema ([place])  6. Interstitial lung disease ([place]) CT chest 03/17/17: Mild to moderate emphysema. Diffuse subtle ground-glass density, felt to correspond to patient's history of underlying interstitial lung disease.  7. Chronic diastolic CHF  8. Cocaine use  Recommendation:  Not much I can help with the patient's clinical situation in view of ongoing tobacco use, cocaine use, BMP reveals baseline values.  There is no JVD, no leg edema, I do not suspect worsening congestive heart failure but may have been related to episode of cocaine use when she presented to the emergency room.  I switched her furosemide from taking at night to ovoid [place] during the nighttime and switched to 2 AM dose.  Advised her to continue using 40 mg for now for a week and then switch back to 20 mg daily.  I would like to see her back in 4 weeks for reinforcing compliance with abstinence from tobacco and cocaine.  Long-term prognosis remains very poor and grim.  Adrian Prows, MD, [facility] 08/26/2018, 3:46  PM [facility]. [place] Pager: [phone] Office: [phone] If no answer Cell [phone]

## 2018-09-14 ENCOUNTER — Other Ambulatory Visit: Payer: Self-pay | Admitting: Internal Medicine

## 2018-09-14 DIAGNOSIS — E2839 Other primary ovarian failure: Secondary | ICD-10-CM

## 2018-09-26 ENCOUNTER — Ambulatory Visit: Payer: Medicaid Other | Admitting: Cardiology

## 2018-09-26 ENCOUNTER — Encounter: Payer: Self-pay | Admitting: Cardiology

## 2018-09-26 ENCOUNTER — Other Ambulatory Visit: Payer: Self-pay

## 2018-09-26 VITALS — BP 126/84 | HR 94 | Ht 64.0 in | Wt 210.0 lb

## 2018-09-26 DIAGNOSIS — I2723 Pulmonary hypertension due to lung diseases and hypoxia: Secondary | ICD-10-CM

## 2018-09-26 DIAGNOSIS — I10 Essential (primary) hypertension: Secondary | ICD-10-CM | POA: Diagnosis not present

## 2018-09-26 DIAGNOSIS — J449 Chronic obstructive pulmonary disease, unspecified: Secondary | ICD-10-CM

## 2018-09-26 DIAGNOSIS — I5032 Chronic diastolic (congestive) heart failure: Secondary | ICD-10-CM | POA: Diagnosis not present

## 2018-09-26 DIAGNOSIS — Z720 Tobacco use: Secondary | ICD-10-CM

## 2018-09-26 DIAGNOSIS — J9611 Chronic respiratory failure with hypoxia: Secondary | ICD-10-CM | POA: Diagnosis not present

## 2018-09-26 NOTE — Progress Notes (Signed)
Subjective:  Primary Physician:  Nolene Ebbs, MD  Patient ID: Diane Gilbert, female    DOB: Sep 11, 1965, 53 y.o.   MRN: [id number]  Chief Complaint  Patient presents with  . Congestive Heart Failure    4 week f/u     HPI: Diane Gilbert  is a 53 y.o. female  with severe COPD and oxygen dependent, hypertension, respiratory bronchiolitis with interstitial lung disease, chronic diastolic heart failure, obstructive sleep apnea noncompliant with CPAP, ongoing tobacco use disorder, no significant CAD by angio in 2017 and mild pulmonary hypertension. Here for f/u of chronic diastolic CHF.   She now presents here for three-week office visit and follow-up, unfortunately still continues to smoke and used cocaine as latest as yesterday.  She has not been practicing social distancing.  Past Medical History:  Diagnosis Date  . Acute on chronic respiratory failure with hypoxia ([place]) 06/07/2014  . Acute respiratory failure with hypoxia ([place]) 03/23/2017  . Alcohol abuse   . Anxiety   . Arthritis   . CHF (congestive heart failure) ([place])   . Community acquired pneumonia   . Depression   . Headache(784.0)   . Hypertension   . Insomnia   . Interstitial lung disease ([place])   . Oxygen dependent   . Respiratory failure, acute and chronic ([place]) 05/09/2017  . Schizophrenia ([place])   . Shortness of breath dyspnea   . Sleep apnea   . Sleep apnea     Past Surgical History:  Procedure Laterality Date  . BACK SURGERY    . LUNG BIOPSY Left 07/16/2014   Procedure: LUNG BIOPSY;  Surgeon: Grace Isaac, MD;  Location: [place];  Service: Thoracic;  Laterality: Left;  Marland Kitchen MULTIPLE TOOTH EXTRACTIONS    . RIGHT/LEFT HEART CATH AND CORONARY ANGIOGRAPHY N/A 01/04/2018   Procedure: RIGHT/LEFT HEART CATH AND CORONARY ANGIOGRAPHY;  Surgeon: Adrian Prows, MD;  Location: [place] [facility];  Service: Cardiovascular;  Laterality: N/A;  . VIDEO ASSISTED THORACOSCOPY Left 07/16/2014   Procedure: VIDEO  ASSISTED THORACOSCOPY;  Surgeon: Grace Isaac, MD;  Location: [place];  Service: Thoracic;  Laterality: Left;  Marland Kitchen VIDEO BRONCHOSCOPY Bilateral 04/11/2014   Procedure: VIDEO BRONCHOSCOPY WITH FLUORO;  Surgeon: Kathee Delton, MD;  Location: [facility];  Service: Cardiopulmonary;  Laterality: Bilateral;  . VIDEO BRONCHOSCOPY N/A 07/16/2014   Procedure: VIDEO BRONCHOSCOPY;  Surgeon: Grace Isaac, MD;  Location: [facility];  Service: Thoracic;  Laterality: N/A;  . WEDGE RESECTION Left 07/16/2014   Procedure: WEDGE RESECTION;  Surgeon: Grace Isaac, MD;  Location: [place];  Service: Thoracic;  Laterality: Left;  left upper lobe lung resection    Social History   Socioeconomic History  . Marital status: Single    Spouse name: Not on file  . Number of children: Not on file  . Years of education: Not on file  . Highest education level: Not on file  Occupational History  . Occupation: unemployed  Social Needs  . Financial resource strain: Not on file  . Food insecurity:    Worry: Not on file    Inability: Not on file  . Transportation needs:    Medical: Not on file    Non-medical: Not on file  Tobacco Use  . Smoking status: Current Every Day Smoker    Packs/day: 1.50    Years: 30.00    Pack years: 45.00    Types: Cigarettes  . Smokeless tobacco: Never Used  . Tobacco comment: smoking 3  cigarettes daily "every now and then" 11/04/16  Substance and Sexual Activity  . Alcohol use: Yes    Alcohol/week: 0.0 standard drinks    Frequency: Never    Comment: occasional   . Drug use: Yes    Types: Cocaine  . Sexual activity: Not Currently    Comment: husband locked up   Lifestyle  . Physical activity:    Days per week: Not on file    Minutes per session: Not on file  . Stress: Not on file  Relationships  . Social connections:    Talks on phone: Not on file    Gets together: Not on file    Attends religious service: Not on file    Active member of club or organization: Not on  file    Attends meetings of clubs or organizations: Not on file    Relationship status: Not on file  . Intimate partner violence:    Fear of current or ex partner: Not on file    Emotionally abused: Not on file    Physically abused: Not on file    Forced sexual activity: Not on file  Other Topics Concern  . Not on file  Social History Narrative  . Not on file    Current Outpatient Medications on File Prior to Visit  Medication Sig Dispense Refill  . albuterol (PROVENTIL HFA;VENTOLIN HFA) 108 (90 BASE) MCG/ACT inhaler Inhale 2 puffs into the lungs every 6 (six) hours as needed for wheezing or shortness of breath. 1 Inhaler 3  . amLODipine (NORVASC) 5 MG tablet Take 1 tablet (5 mg total) by mouth daily. 30 tablet 1  . diclofenac sodium (VOLTAREN) 1 % GEL Apply 2 g topically daily as needed (pain).    . furosemide (LASIX) 40 MG tablet Take 1 tablet (40 mg total) by mouth every morning.    . lidocaine (LIDODERM) 5 % Place 1 patch onto the skin daily. Remove & Discard patch within 12 hours or as directed by MD 30 patch 0  . lisinopril-hydrochlorothiazide (PRINZIDE,ZESTORETIC) 20-25 MG tablet Take 1 tablet by mouth daily. 30 tablet 1  . Misc. Devices MISC CPAP @@ Night    . nicotine polacrilex (NICORETTE) 2 MG gum Take 2 mg by mouth as needed for smoking cessation.    . OXYGEN Inhale 4 L into the lungs continuous.     Marland Kitchen QUEtiapine (SEROQUEL) 300 MG tablet Take 1 tablet (300 mg total) by mouth at bedtime. (Patient taking differently: Take 300 mg by mouth at bedtime. 100 mg in the morning and 200 mg in the evening.) 30 tablet 1  . rosuvastatin (CRESTOR) 10 MG tablet Take 1 tablet (10 mg total) by mouth daily. 30 tablet 1  . SYMBICORT 160-4.5 MCG/ACT inhaler INHALE TWO puffs into THE lungs TWICE DAILY 10.2 g 5  . tiZANidine (ZANAFLEX) 4 MG tablet Take 4 mg by mouth 2 (two) times daily as needed for muscle spasms.     . traZODone (DESYREL) 100 MG tablet Take 100 mg by mouth at bedtime.     No  current facility-administered medications on file prior to visit.      Review of Systems  Constitutional: Negative for malaise/fatigue and weight loss.  Respiratory: Positive for cough and shortness of breath. Negative for hemoptysis.   Cardiovascular: Negative for chest pain, palpitations, claudication and leg swelling.  Gastrointestinal: Positive for constipation. Negative for abdominal pain, blood in stool, heartburn and vomiting.  Genitourinary: Negative for dysuria.  Musculoskeletal: Positive for joint pain.  Negative for myalgias.  Neurological: Negative for dizziness, focal weakness and headaches.  Endo/Heme/Allergies: Does not bruise/bleed easily.  Psychiatric/Behavioral: Negative for depression. The patient is nervous/anxious.   All other systems reviewed and are negative.     Objective:  Blood pressure 126/84, pulse 94, height _0  (1.626 m), weight 210 lb (95.3 kg), last menstrual period 04/06/2017, SpO2 91 %. Body mass index is 36.05 kg/m.  Physical Exam  Constitutional: She appears well-developed and well-nourished. No distress.  HENT:  Head: Atraumatic.  Eyes: Conjunctivae are normal.  Neck: Neck supple. No JVD present. No thyromegaly present.  Cardiovascular: Normal rate, regular rhythm, normal heart sounds and intact distal pulses. Exam reveals no gallop.  No murmur heard. Pulmonary/Chest: No tachypnea. Respiratory distress: diffuse. She has wheezes. She has rales (diffuse).  Abdominal: Soft. Bowel sounds are normal.  Musculoskeletal: Normal range of motion.        General: No edema.  Neurological: She is alert.  Skin: Skin is warm and dry.  Psychiatric: She has a normal mood and affect.    CARDIAC STUDIES:   Right and left heart catheterization 01/04/2018: RA 6/5/4 mmHg; RV 36/3, EDP 7 mmHg; PA 33/23, mean 28 mmHg.  PA saturation 74%.;  PW 6/6, mean 5 mmHg.  Aortic saturation 95%. Cardiac output 4.76, cardiac index 2.52 by Fick.  QP/QS 1.0.  PVR 387 dynes per  second.  Left heart catheterization: LV 123/4, EDP 6 mmHg.  Aortic pressure 136/80, mean 102 mmHg.  No LV to aortic pressure gradient. Normal coronary arteries, right dominant circulation.  Echocardiogram 07/09/2017: Normal LV systolic function, EF 57-47%, mildly thickened mitral valve leaflets.  Moderately dilated right ventricle, severe pulmonary hypertension with mild TR. Assessment & Recommendations:   Chronic respiratory failure with hypoxia (HCC)  Pulmonary hypertension due to COPD [facility])  Essential hypertension  Chronic diastolic (congestive) heart failure ([place])    CT chest 03/17/17: Mild to moderate emphysema. Diffuse subtle ground-glass density, felt to correspond to patient's history of underlying interstitial lung disease.  Recommendation:  Not much I can help with the patient's clinical situation in view of ongoing tobacco use, cocaine use, fortunately not in acute decompensated heart failure.  Blood pressure is well controlled.  I have again discussed with her regarding abstinence from tobacco and also cocaine.  I have also extensively counseled her regarding social distancing in view of COVID 41.  I will see her back in 2 months. She does not have means of virtual communication.   Adrian Prows, MD, [facility] 09/26/2018, 9:45 AM [facility]. [place] Pager: [phone] Office: [phone] If no answer Cell [phone]

## 2018-10-11 ENCOUNTER — Ambulatory Visit: Payer: Self-pay | Admitting: Pulmonary Disease

## 2018-10-31 ENCOUNTER — Other Ambulatory Visit: Payer: Self-pay | Admitting: Internal Medicine

## 2018-10-31 DIAGNOSIS — Z1231 Encounter for screening mammogram for malignant neoplasm of breast: Secondary | ICD-10-CM

## 2018-11-28 ENCOUNTER — Ambulatory Visit: Payer: Medicaid Other | Admitting: Cardiology

## 2018-11-28 ENCOUNTER — Encounter: Payer: Self-pay | Admitting: Cardiology

## 2018-11-28 ENCOUNTER — Other Ambulatory Visit: Payer: Self-pay

## 2018-11-28 VITALS — Ht 64.0 in | Wt 211.0 lb

## 2018-11-28 DIAGNOSIS — I5032 Chronic diastolic (congestive) heart failure: Secondary | ICD-10-CM

## 2018-11-28 DIAGNOSIS — I10 Essential (primary) hypertension: Secondary | ICD-10-CM

## 2018-11-28 DIAGNOSIS — J9611 Chronic respiratory failure with hypoxia: Secondary | ICD-10-CM

## 2018-11-28 NOTE — Progress Notes (Signed)
Virtual Visit via Telephone Note: Patient unable to use video assisted device.  This visit type was conducted due to national recommendations for restrictions regarding the COVID-19 Pandemic (e.g. social distancing).  This format is felt to be most appropriate for this patient at this time.  All issues noted in this document were discussed and addressed.  No physical exam was performed.  The patient has consented to conduct a Telehealth visit and understands insurance will be billed.   I connected with@, on 11/28/18 at  by TELEPHONE and verified that I am speaking with the correct person using two identifiers.   I discussed the limitations of evaluation and management by telemedicine and the availability of in person appointments. The patient expressed understanding and agreed to proceed.   I have discussed with patient regarding the safety during COVID Pandemic and steps and precautions to be taken including social distancing, frequent hand wash and use of detergent soap, gels with the patient. I asked the patient to avoid touching mouth, nose, eyes, ears with the hands. I encouraged regular walking around the neighborhood and exercise and regular diet, as long as social distancing can be maintained.  Subjective:  Primary Physician:  Nolene Ebbs, MD  Patient ID: Diane Gilbert, female    DOB: 1965/08/12, 53 y.o.   MRN: [id number]  Chief Complaint  Patient presents with  . Congestive Heart Failure  . Follow-up  . Hypertension    HPI: Diane Gilbert  is a 53 y.o. female  with severe COPD and oxygen dependent, hypertension, respiratory bronchiolitis with interstitial lung disease, chronic diastolic heart failure, obstructive sleep apnea noncompliant with CPAP, ongoing tobacco use disorder, no significant CAD by angio in 2017 and mild pulmonary hypertension. Here for f/u of chronic diastolic CHF.   She now presents here for three-month office visit and follow-up, unfortunately  still continues to smoke and used cocaine as latest as yesterday.  She has not been practicing social distancing.  Past Medical History:  Diagnosis Date  . Acute on chronic respiratory failure with hypoxia ([place]) 06/07/2014  . Acute respiratory failure with hypoxia ([place]) 03/23/2017  . Alcohol abuse   . Anxiety   . Arthritis   . CHF (congestive heart failure) ([place])   . Community acquired pneumonia   . Depression   . Headache(784.0)   . Hypertension   . Insomnia   . Interstitial lung disease ([place])   . Oxygen dependent   . Respiratory failure, acute and chronic ([place]) 05/09/2017  . Schizophrenia ([place])   . Shortness of breath dyspnea   . Sleep apnea   . Sleep apnea     Past Surgical History:  Procedure Laterality Date  . BACK SURGERY    . LUNG BIOPSY Left 07/16/2014   Procedure: LUNG BIOPSY;  Surgeon: Grace Isaac, MD;  Location: [place];  Service: Thoracic;  Laterality: Left;  Marland Kitchen MULTIPLE TOOTH EXTRACTIONS    . RIGHT/LEFT HEART CATH AND CORONARY ANGIOGRAPHY N/A 01/04/2018   Procedure: RIGHT/LEFT HEART CATH AND CORONARY ANGIOGRAPHY;  Surgeon: Adrian Prows, MD;  Location: [place] [facility];  Service: Cardiovascular;  Laterality: N/A;  . VIDEO ASSISTED THORACOSCOPY Left 07/16/2014   Procedure: VIDEO ASSISTED THORACOSCOPY;  Surgeon: Grace Isaac, MD;  Location: [facility];  Service: Thoracic;  Laterality: Left;  Marland Kitchen VIDEO BRONCHOSCOPY Bilateral 04/11/2014   Procedure: VIDEO BRONCHOSCOPY WITH FLUORO;  Surgeon: Kathee Delton, MD;  [facility];  Service: Cardiopulmonary;  Laterality: Bilateral;  . VIDEO BRONCHOSCOPY N/A 07/16/2014  Procedure: VIDEO BRONCHOSCOPY;  Surgeon: Grace Isaac, MD;  Location: [facility] OR;  Service: Thoracic;  Laterality: N/A;  . WEDGE RESECTION Left 07/16/2014   Procedure: WEDGE RESECTION;  Surgeon: Grace Isaac, MD;  Location: [place];  Service: Thoracic;  Laterality: Left;  left upper lobe lung resection    Social History   Socioeconomic History  .  Marital status: Significant Other    Spouse name: Not on file  . Number of children: 0  . Years of education: Not on file  . Highest education level: Not on file  Occupational History  . Occupation: unemployed  Social Needs  . Financial resource strain: Not on file  . Food insecurity:    Worry: Not on file    Inability: Not on file  . Transportation needs:    Medical: Not on file    Non-medical: Not on file  Tobacco Use  . Smoking status: Current Every Day Smoker    Packs/day: 1.50    Years: 30.00    Pack years: 45.00    Types: Cigarettes  . Smokeless tobacco: Never Used  . Tobacco comment: smoking 3 cigarettes daily "every now and then" 11/04/16  Substance and Sexual Activity  . Alcohol use: Yes    Alcohol/week: 0.0 standard drinks    Frequency: Never    Comment: occasional   . Drug use: Yes    Types: Cocaine  . Sexual activity: Not Currently    Comment: husband locked up   Lifestyle  . Physical activity:    Days per week: Not on file    Minutes per session: Not on file  . Stress: Not on file  Relationships  . Social connections:    Talks on phone: Not on file    Gets together: Not on file    Attends religious service: Not on file    Active member of club or organization: Not on file    Attends meetings of clubs or organizations: Not on file    Relationship status: Not on file  . Intimate partner violence:    Fear of current or ex partner: Not on file    Emotionally abused: Not on file    Physically abused: Not on file    Forced sexual activity: Not on file  Other Topics Concern  . Not on file  Social History Narrative  . Not on file    Current Outpatient Medications on File Prior to Visit  Medication Sig Dispense Refill  . albuterol (PROVENTIL HFA;VENTOLIN HFA) 108 (90 BASE) MCG/ACT inhaler Inhale 2 puffs into the lungs every 6 (six) hours as needed for wheezing or shortness of breath. 1 Inhaler 3  . diclofenac sodium (VOLTAREN) 1 % GEL Apply 2 g topically  daily as needed (pain).    . Misc. Devices MISC CPAP @@ Night    . nicotine polacrilex (NICORETTE) 2 MG gum Take 2 mg by mouth as needed for smoking cessation.    . OXYGEN Inhale 4 L into the lungs continuous.     Marland Kitchen QUEtiapine (SEROQUEL) 300 MG tablet Take 1 tablet (300 mg total) by mouth at bedtime. (Patient taking differently: Take 300 mg by mouth at bedtime. 100 mg in the morning and 200 mg in the evening.) 30 tablet 1  . rosuvastatin (CRESTOR) 10 MG tablet Take 1 tablet (10 mg total) by mouth daily. 30 tablet 1  . SYMBICORT 160-4.5 MCG/ACT inhaler INHALE TWO puffs into THE lungs TWICE DAILY 10.2 g 5  . tiZANidine (ZANAFLEX)  4 MG tablet Take 4 mg by mouth 2 (two) times daily as needed for muscle spasms.     . traZODone (DESYREL) 100 MG tablet Take 100 mg by mouth at bedtime.    Marland Kitchen amLODipine (NORVASC) 5 MG tablet Take 1 tablet (5 mg total) by mouth daily. 30 tablet 1   No current facility-administered medications on file prior to visit.    Review of Systems  Constitutional: Negative for malaise/fatigue and weight loss.  Respiratory: Positive for cough and shortness of breath. Negative for hemoptysis.   Cardiovascular: Negative for chest pain, palpitations, claudication and leg swelling.  Gastrointestinal: Positive for constipation. Negative for abdominal pain, blood in stool, heartburn and vomiting.  Genitourinary: Negative for dysuria.  Musculoskeletal: Positive for joint pain. Negative for myalgias.  Neurological: Negative for dizziness, focal weakness and headaches.  Endo/Heme/Allergies: Does not bruise/bleed easily.  Psychiatric/Behavioral: Negative for depression. The patient is nervous/anxious.   All other systems reviewed and are negative.     Objective:  Height _0  (1.626 m), weight 211 lb (95.7 kg), last menstrual period 04/06/2017. Body mass index is 36.22 kg/m.  Physical exam not performed or limited due to virtual visit.   Please see exam details from prior visit is as  below.  Physical Exam  Constitutional: She appears well-developed and well-nourished. No distress.  HENT:  Head: Atraumatic.  Eyes: Conjunctivae are normal.  Neck: Neck supple. No JVD present. No thyromegaly present.  Cardiovascular: Normal rate, regular rhythm, normal heart sounds and intact distal pulses. Exam reveals no gallop.  No murmur heard. Pulmonary/Chest: No tachypnea. Respiratory distress: diffuse. She has wheezes. She has rales (diffuse).  Abdominal: Soft. Bowel sounds are normal.  Musculoskeletal: Normal range of motion.        General: No edema.  Neurological: She is alert.  Skin: Skin is warm and dry.  Psychiatric: She has a normal mood and affect.    CARDIAC STUDIES:   Right and left heart catheterization 01/04/2018: RA 6/5/4 mmHg; RV 36/3, EDP 7 mmHg; PA 33/23, mean 28 mmHg.  PA saturation 74%.;  PW 6/6, mean 5 mmHg.  Aortic saturation 95%. Cardiac output 4.76, cardiac index 2.52 by Fick.  QP/QS 1.0.  PVR 387 dynes per second.  Left heart catheterization: LV 123/4, EDP 6 mmHg.  Aortic pressure 136/80, mean 102 mmHg.  No LV to aortic pressure gradient. Normal coronary arteries, right dominant circulation.  Echocardiogram 07/09/2017: Normal LV systolic function, EF 12-45%, mildly thickened mitral valve leaflets.  Moderately dilated right ventricle, severe pulmonary hypertension with mild TR. Assessment & Recommendations:   Chronic diastolic (congestive) heart failure (HCC)  Essential hypertension  Chronic respiratory failure with hypoxia ([place])   CT chest 03/17/17: Mild to moderate emphysema. Diffuse subtle ground-glass density, felt to correspond to patient's history of underlying interstitial lung disease.  Recommendation:  Not much I can help with the patient's clinical situation in view of ongoing tobacco use, cocaine use. Last drug use was yesterday.  Blood pressure is well controlled.  I have again discussed with her regarding abstinence from tobacco and  also cocaine.  She denied PND, has chronic orthopnea, no leg edema, and has been taking Amlodipine for BP. No change done today.  I will see her in 6 months.

## 2018-12-05 ENCOUNTER — Encounter (HOSPITAL_COMMUNITY): Payer: Self-pay

## 2018-12-05 ENCOUNTER — Ambulatory Visit (HOSPITAL_COMMUNITY)
Admission: EM | Admit: 2018-12-05 | Discharge: 2018-12-05 | Disposition: A | Discharge status: Home / Self Care | Payer: Medicaid Other | Attending: Family Medicine | Admitting: Family Medicine

## 2018-12-05 ENCOUNTER — Other Ambulatory Visit: Payer: Self-pay

## 2018-12-05 DIAGNOSIS — M79604 Pain in right leg: Secondary | ICD-10-CM | POA: Diagnosis not present

## 2018-12-05 MED ORDER — NAPROXEN 375 MG PO TABS: 375.0000 mg | ORAL_TABLET | Freq: Two times a day (BID) | ORAL | 0 refills | Status: DC

## 2018-12-05 NOTE — Discharge Instructions (Signed)
Begin naprosyn twice daily for the next 1-2 weeks Please elevate leg when resting at home and try ice Ace wrap to further support ankle  Please follow-up if developing any swelling, redness, increased pain or weakness in leg.

## 2018-12-05 NOTE — ED Triage Notes (Signed)
Pt presents with complaints of right leg pain from her foot up into her hip. Patient denies any injury.  Patient is coughing during triage and appears to be mildly short of breath. Pt is on 2L of oxygen that she wears at home.  Pt reports that she normally is a little short of breath and has a cough with ambulation.

## 2018-12-06 NOTE — ED Provider Notes (Signed)
[facility]    CSN: [id number] Arrival date & time: 12/05/18  1503     History   Chief Complaint Chief Complaint  Patient presents with  . Leg Pain    HPI Diane Gilbert is a 53 y.o. female history of chronic respiratory failure on chronic O2, CHF, interstitial lung disease, OSA, presenting today for evaluation of right leg pain.  Patient states that she has pain that begins in her right foot and ankle and radiates upward into her hip.  She denies any injury, increase in activity.  Denies previous injury to this leg.  Denies pain focally around specific joint.  Denies any swelling or redness.  Pain worsens with weightbearing.  She has tried some Tylenol without relief.  Patient does continue to smoke approximately 1 pack a day.  Denies previous DVT or PE.  Denies recent travel or immobilization.  Patient also is on chronic 2 L of oxygen.  She has shortness of breath and cough at baseline.  She denies recent worsening of her symptoms or any fevers chills or body aches.  Denies any chest pain.  Denies any known exposures to COVID.  HPI  Past Medical History:  Diagnosis Date  . Acute on chronic respiratory failure with hypoxia ([place]) 06/07/2014  . Acute respiratory failure with hypoxia ([place]) 03/23/2017  . Alcohol abuse   . Anxiety   . Arthritis   . CHF (congestive heart failure) ([place])   . Community acquired pneumonia   . Depression   . Headache(784.0)   . Hypertension   . Insomnia   . Interstitial lung disease ([place])   . Oxygen dependent   . Respiratory failure, acute and chronic ([place]) 05/09/2017  . Schizophrenia ([place])   . Shortness of breath dyspnea   . Sleep apnea   . Sleep apnea     Patient Active Problem List   Diagnosis Date Noted  . Chronic diastolic CHF (congestive heart failure) ([place]) 05/09/2017  . Facial swelling 03/23/2017  . Chronic respiratory failure with hypoxia ([place]) 03/23/2017  . OSA (obstructive sleep apnea) 07/21/2016  . Acute diastolic  congestive heart failure ([place])   . Schizophrenia ([place]) 03/24/2016  . Leg swelling 01/16/2016  . Interstitial lung disease ([place]) 07/16/2014  . Respiratory bronchiolitis interstitial lung disease ([place]) 12/26/2013  . Irregular menstrual cycle 01/23/2013  . Smoking addiction 01/23/2013  . Alcohol abuse 02/19/2012  . Hyponatremia 02/19/2012  . Abdominal pain 02/19/2012  . Hypokalemia 02/19/2012    Past Surgical History:  Procedure Laterality Date  . BACK SURGERY    . LUNG BIOPSY Left 07/16/2014   Procedure: LUNG BIOPSY;  Surgeon: Grace Isaac, MD;  Location: [facility];  Service: Thoracic;  Laterality: Left;  Marland Kitchen MULTIPLE TOOTH EXTRACTIONS    . RIGHT/LEFT HEART CATH AND CORONARY ANGIOGRAPHY N/A 01/04/2018   Procedure: RIGHT/LEFT HEART CATH AND CORONARY ANGIOGRAPHY;  Surgeon: Adrian Prows, MD;  Location: [facility];  Service: Cardiovascular;  Laterality: N/A;  . VIDEO ASSISTED THORACOSCOPY Left 07/16/2014   Procedure: VIDEO ASSISTED THORACOSCOPY;  Surgeon: Grace Isaac, MD;  Location: [facility];  Service: Thoracic;  Laterality: Left;  Marland Kitchen VIDEO BRONCHOSCOPY Bilateral 04/11/2014   Procedure: VIDEO BRONCHOSCOPY WITH FLUORO;  Surgeon: Kathee Delton, MD;  Location: [facility];  Service: Cardiopulmonary;  Laterality: Bilateral;  . VIDEO BRONCHOSCOPY N/A 07/16/2014   Procedure: VIDEO BRONCHOSCOPY;  Surgeon: Grace Isaac, MD;  Location: [facility];  Service: Thoracic;  Laterality: N/A;  . WEDGE RESECTION Left 07/16/2014  Procedure: WEDGE RESECTION;  Surgeon: Grace Isaac, MD;  Location: [facility];  Service: Thoracic;  Laterality: Left;  left upper lobe lung resection    OB History    Gravida  0   Para  0   Term  0   Preterm  0   AB  0   Living  0     SAB  0   TAB  0   Ectopic  0   Multiple  0   Live Births               Home Medications    Prior to Admission medications   Medication Sig Start Date End Date Taking? Authorizing Provider  albuterol (PROVENTIL  HFA;VENTOLIN HFA) 108 (90 BASE) MCG/ACT inhaler Inhale 2 puffs into the lungs every 6 (six) hours as needed for wheezing or shortness of breath. 05/09/15   Juanito Doom, MD  amLODipine (NORVASC) 5 MG tablet Take 1 tablet (5 mg total) by mouth daily. 01/17/18   Blanchie Dessert, MD  diclofenac sodium (VOLTAREN) 1 % GEL Apply 2 g topically daily as needed (pain).    [provider]  Misc. Devices MISC CPAP @@ Night    [provider]  naproxen (NAPROSYN) 375 MG tablet Take 1 tablet (375 mg total) by mouth 2 (two) times daily. 12/05/18   Wieters, Hallie C, PA-C  nicotine polacrilex (NICORETTE) 2 MG gum Take 2 mg by mouth as needed for smoking cessation.    [provider]  OXYGEN Inhale 4 L into the lungs continuous.     [provider]  QUEtiapine (SEROQUEL) 300 MG tablet Take 1 tablet (300 mg total) by mouth at bedtime. Patient taking differently: Take 300 mg by mouth at bedtime. 100 mg in the morning and 200 mg in the evening. 06/18/17   Arfeen, Arlyce Harman, MD  rosuvastatin (CRESTOR) 10 MG tablet Take 1 tablet (10 mg total) by mouth daily. 01/17/18   Blanchie Dessert, MD  SYMBICORT 160-4.5 MCG/ACT inhaler INHALE TWO puffs into THE lungs TWICE DAILY 05/27/18   Juanito Doom, MD  tiZANidine (ZANAFLEX) 4 MG tablet Take 4 mg by mouth 2 (two) times daily as needed for muscle spasms.     [provider]  traZODone (DESYREL) 100 MG tablet Take 100 mg by mouth at bedtime.    [provider]    Family History Family History  Problem Relation Age of Onset  . Breast cancer Mother   . Obstructive Sleep Apnea Mother   . COPD Maternal Aunt     Social History Social History   Tobacco Use  . Smoking status: Current Every Day Smoker    Packs/day: 1.50    Years: 30.00    Pack years: 45.00    Types: Cigarettes  . Smokeless tobacco: Never Used  . Tobacco comment: smoking 3 cigarettes daily "every now and then" 11/04/16  Substance Use Topics  .  Alcohol use: Yes    Alcohol/week: 0.0 standard drinks    Frequency: Never    Comment: occasional   . Drug use: Yes    Types: Cocaine     Allergies   Contrast media [iodinated diagnostic agents]   Review of Systems Review of Systems  Constitutional: Negative for fatigue and fever.  Eyes: Negative for visual disturbance.  Respiratory: Negative for cough and shortness of breath.   Cardiovascular: Negative for chest pain.  Gastrointestinal: Negative for abdominal pain, nausea and vomiting.  Musculoskeletal: Positive for arthralgias, gait  problem and myalgias. Negative for joint swelling.  Skin: Negative for color change, rash and wound.  Neurological: Negative for dizziness, weakness, light-headedness and headaches.     Physical Exam Triage Vital Signs ED Triage Vitals  Enc Vitals Group     BP 12/05/18 1608 (!) 182/88     Pulse Rate 12/05/18 1608 86     Resp 12/05/18 1608 20     Temp 12/05/18 1608 98 F (36.7 C)     Temp Source 12/05/18 1608 Temporal     SpO2 12/05/18 1608 93 %     Weight --      Height --      Head Circumference --      Peak Flow --      Pain Score 12/05/18 1623 8     Pain Loc --      Pain Edu? --      Excl. in [place]? --    No data found.  Updated Vital Signs BP (!) 182/88 (BP Location: Left Arm)   Pulse 86   Temp 98 F (36.7 C) (Temporal)   Resp 20   LMP 04/06/2017   SpO2 93%   Visual Acuity Right Eye Distance:   Left Eye Distance:   Bilateral Distance:    Right Eye Near:   Left Eye Near:    Bilateral Near:     Physical Exam Vitals signs and nursing note reviewed.  Constitutional:      General: She is not in acute distress.    Appearance: She is well-developed.  HENT:     Head: Normocephalic and atraumatic.     Comments: Appearance of face appears slightly flushed    Mouth/Throat:     Comments: Oral mucosa pink and moist, no tonsillar enlargement or exudate. Posterior pharynx patent and nonerythematous, no uvula deviation or  swelling. Normal phonation.  Eyes:     Conjunctiva/sclera: Conjunctivae normal.  Neck:     Musculoskeletal: Neck supple.  Cardiovascular:     Rate and Rhythm: Normal rate and regular rhythm.     Heart sounds: No murmur.  Pulmonary:     Effort: Pulmonary effort is normal. No respiratory distress.     Breath sounds: Normal breath sounds.     Comments: Wearing O2, does not appear to be struggling to breathe.  Breath sounds auscultated bilaterally without adventitious sounds Abdominal:     Palpations: Abdomen is soft.     Tenderness: There is no abdominal tenderness.  Musculoskeletal:     Comments: Right ankle/foot: No obvious deformity erythema or swelling, dorsalis pedis 2+, mild tenderness to palpation over lateral malleolus, full active range of motion, Achilles reflex 2+ bilaterally  No erythema or swelling seen extending into lower leg/calf, nontender to palpation of calf, negative Homans  Right knee: No obvious deformity erythema or swelling, nontender to palpation over patella or medial lateral joint line, full active range of motion of knee, strength 5/5 and equal bilaterally, patellar reflex 2+ bilaterally  Right hip: No obvious deformity erythema or swelling, mild tenderness to palpation of ASIS, strength 5/5 and equal bilaterally at hip  Skin:    General: Skin is warm and dry.  Neurological:     Mental Status: She is alert.      UC Treatments / Results  Labs (all labs ordered are listed, but only abnormal results are displayed) Labs Reviewed - No data to display  EKG None  Radiology No results found.  Procedures Procedures (including critical care time)  Medications Ordered  in UC Medications - No data to display  Initial Impression / Assessment and Plan / UC Course  I have reviewed the triage vital signs and the nursing notes.  Pertinent labs & imaging results that were available during my care of the patient were reviewed by me and considered in my medical  decision making (see chart for details).     No mechanism of injury, just not suspect underlying fracture.  Most likely inflammatory/arthritic.  Pain throughout entire right leg without specific focal source, but does seem to endorse worse pain at ankle.  Patient continues to smoke increasing risk for DVT, but no overlying erythema or swelling noted.  Feel this is less likely at this time.  Will recommend anti-inflammatories.  Will provide temporary NSAID use to further help with symptoms given not responding with Tylenol.  Advised to use this only over the next 1 to 2 weeks given her heart history.  Ace wrap to right ankle.  Ice and elevation.  Rest.  Follow-up if symptoms changing or worsening, developing weakness or any swelling or redness.  Patient seems to be at her baseline respiratory status, no increase in cough or shortness of breath, O2 stable at her baseline.  Discussed strict return precautions. Patient verbalized understanding and is agreeable with plan.  Final Clinical Impressions(s) / UC Diagnoses   Final diagnoses:  Right leg pain     Discharge Instructions     Begin naprosyn twice daily for the next 1-2 weeks Please elevate leg when resting at home and try ice Ace wrap to further support ankle  Please follow-up if developing any swelling, redness, increased pain or weakness in leg.   ED Prescriptions    Medication Sig Dispense Auth. Provider   naproxen (NAPROSYN) 375 MG tablet Take 1 tablet (375 mg total) by mouth 2 (two) times daily. 20 tablet Wieters, [place] C, PA-C     Controlled Substance Prescriptions [place] Controlled Substance Registry consulted? Not Applicable   Janith Lima, [place] 12/06/18 [phone]

## 2019-01-27 ENCOUNTER — Ambulatory Visit
Admission: RE | Admit: 2019-01-27 | Discharge: 2019-01-27 | Disposition: A | Discharge status: Home / Self Care | Payer: Medicaid Other | Source: Ambulatory Visit | Attending: Internal Medicine | Admitting: Internal Medicine

## 2019-01-27 ENCOUNTER — Other Ambulatory Visit: Payer: Self-pay

## 2019-01-27 DIAGNOSIS — E2839 Other primary ovarian failure: Secondary | ICD-10-CM

## 2019-01-27 DIAGNOSIS — Z1231 Encounter for screening mammogram for malignant neoplasm of breast: Secondary | ICD-10-CM

## 2019-02-06 ENCOUNTER — Ambulatory Visit (INDEPENDENT_AMBULATORY_CARE_PROVIDER_SITE_OTHER): Payer: Medicaid Other | Admitting: Primary Care

## 2019-02-06 ENCOUNTER — Other Ambulatory Visit: Payer: Self-pay

## 2019-02-06 ENCOUNTER — Encounter: Payer: Self-pay | Admitting: Primary Care

## 2019-02-06 ENCOUNTER — Ambulatory Visit: Payer: Medicaid Other | Admitting: Primary Care

## 2019-02-06 DIAGNOSIS — J84115 Respiratory bronchiolitis interstitial lung disease: Secondary | ICD-10-CM | POA: Diagnosis not present

## 2019-02-06 DIAGNOSIS — F32A Depression, unspecified: Secondary | ICD-10-CM | POA: Insufficient documentation

## 2019-02-06 DIAGNOSIS — J9611 Chronic respiratory failure with hypoxia: Secondary | ICD-10-CM

## 2019-02-06 DIAGNOSIS — J849 Interstitial pulmonary disease, unspecified: Secondary | ICD-10-CM | POA: Diagnosis not present

## 2019-02-06 DIAGNOSIS — F329 Major depressive disorder, single episode, unspecified: Secondary | ICD-10-CM | POA: Insufficient documentation

## 2019-02-06 DIAGNOSIS — W19XXXA Unspecified fall, initial encounter: Secondary | ICD-10-CM

## 2019-02-06 MED ORDER — ALBUTEROL SULFATE (2.5 MG/3ML) 0.083% IN NEBU: 2.5000 mg | INHALATION_SOLUTION | Freq: Four times a day (QID) | RESPIRATORY_TRACT | 2 refills | Status: AC | PRN

## 2019-02-06 MED ORDER — BUDESONIDE-FORMOTEROL FUMARATE 160-4.5 MCG/ACT IN AERO: INHALATION_SPRAY | RESPIRATORY_TRACT | 5 refills | Status: DC

## 2019-02-06 MED ORDER — PREDNISONE 10 MG PO TABS: ORAL_TABLET | ORAL | 0 refills | Status: DC

## 2019-02-06 NOTE — Assessment & Plan Note (Signed)
-  Patient reports increase in falls at home - Syncopal episode when not wearing oxygen at home in the shower - Refer to home health and PT

## 2019-02-06 NOTE — Patient Instructions (Addendum)
Needs HRCT re: RB-ILD  Referral home health nursing re: shortness of breath, O2 management   PT eval re: falls   Needs nebulizer machine and pulse oximeter   You need to wear 4L of oxygen at all times   TIME to quit smoking!!!!!!!  Follow up - Needs PFTs in 3 months  - Needs 3 month visit with new [facility] pulmonary provider/ Dr. Halford Chessman, Lamonte Sakai, Francina Ames  (prior [facility] patient)    Steps to Quit Smoking Smoking tobacco is the leading cause of preventable death. It can affect almost every organ in the body. Smoking puts you and people around you at risk for many serious, long-lasting (chronic) diseases. Quitting smoking can be hard, but it is one of the best things that you can do for your health. It is never too late to quit. How do I get ready to quit? When you decide to quit smoking, make a plan to help you succeed. Before you quit:  Pick a date to quit. Set a date within the next 2 weeks to give you time to prepare.  Write down the reasons why you are quitting. Keep this list in places where you will see it often.  Tell your family, friends, and co-workers that you are quitting. Their support is important.  Talk with your doctor about the choices that may help you quit.  Find out if your health insurance will pay for these treatments.  Know the people, places, things, and activities that make you want to smoke (triggers). Avoid them. What first steps can I take to quit smoking?  Throw away all cigarettes at home, at work, and in your car.  Throw away the things that you use when you smoke, such as ashtrays and lighters.  Clean your car. Make sure to empty the ashtray.  Clean your home, including curtains and carpets. What can I do to help me quit smoking? Talk with your doctor about taking medicines and seeing a counselor at the same time. You are more likely to succeed when you do both.  If you are pregnant or breastfeeding, talk with your doctor about counseling or  other ways to quit smoking. Do not take medicine to help you quit smoking unless your doctor tells you to do so. To quit smoking: Quit right away  Quit smoking totally, instead of slowly cutting back on how much you smoke over a period of time.  Go to counseling. You are more likely to quit if you go to counseling sessions regularly. Take medicine You may take medicines to help you quit. Some medicines need a prescription, and some you can buy over-the-counter. Some medicines may contain a drug called nicotine to replace the nicotine in cigarettes. Medicines may:  Help you to stop having the desire to smoke (cravings).  Help to stop the problems that come when you stop smoking (withdrawal symptoms). Your doctor may ask you to use:  Nicotine patches, gum, or lozenges.  Nicotine inhalers or sprays.  Non-nicotine medicine that is taken by mouth. Find resources Find resources and other ways to help you quit smoking and remain smoke-free after you quit. These resources are most helpful when you use them often. They include:  Online chats with a Social worker.  Phone quitlines.  Printed Furniture conservator/restorer.  Support groups or group counseling.  Text messaging programs.  Mobile phone apps. Use apps on your mobile phone or tablet that can help you stick to your quit plan. There are many free apps for  mobile phones and tablets as well as websites. Examples include Quit Guide from the [employer] and smokefree.gov  What things can I do to make it easier to quit?   Talk to your family and friends. Ask them to support and encourage you.  Call a phone quitline (1-800-QUIT-NOW), reach out to support groups, or work with a Social worker.  Ask people who smoke to not smoke around you.  Avoid places that make you want to smoke, such as: ? Bars. ? Parties. ? Smoke-break areas at work.  Spend time with people who do not smoke.  Lower the stress in your life. Stress can make you want to smoke. Try  these things to help your stress: ? Getting regular exercise. ? Doing deep-breathing exercises. ? Doing yoga. ? Meditating. ? Doing a body scan. To do this, close your eyes, focus on one area of your body at a time from head to toe. Notice which parts of your body are tense. Try to relax the muscles in those areas. How will I feel when I quit smoking? Day 1 to 3 weeks Within the first 24 hours, you may start to have some problems that come from quitting tobacco. These problems are very bad 2-3 days after you quit, but they do not often last for more than 2-3 weeks. You may get these symptoms:  Mood swings.  Feeling restless, nervous, angry, or annoyed.  Trouble concentrating.  Dizziness.  Strong desire for high-sugar foods and nicotine.  Weight gain.  Trouble pooping (constipation).  Feeling like you may vomit (nausea).  Coughing or a sore throat.  Changes in how the medicines that you take for other issues work in your body.  Depression.  Trouble sleeping (insomnia). Week 3 and afterward After the first 2-3 weeks of quitting, you may start to notice more positive results, such as:  Better sense of smell and taste.  Less coughing and sore throat.  Slower heart rate.  Lower blood pressure.  Clearer skin.  Better breathing.  Fewer sick days. Quitting smoking can be hard. Do not give up if you fail the first time. Some people need to try a few times before they succeed. Do your best to stick to your quit plan, and talk with your doctor if you have any questions or concerns. Summary  Smoking tobacco is the leading cause of preventable death. Quitting smoking can be hard, but it is one of the best things that you can do for your health.  When you decide to quit smoking, make a plan to help you succeed.  Quit smoking right away, not slowly over a period of time.  When you start quitting, seek help from your doctor, family, or friends. This information is not  intended to replace advice given to you by your health care provider. Make sure you discuss any questions you have with your health care provider. Document Released: 04/11/2009 Document Revised: 09/02/2018 Document Reviewed: 09/03/2018 Elsevier Patient Education  2020 [employer].

## 2019-02-06 NOTE — Assessment & Plan Note (Addendum)
-  Patient has not been wearing oxygen as prescribed  - Advised patient wear 2-4L O2 at all times to keep O2 >88%

## 2019-02-06 NOTE — Progress Notes (Signed)
_0  ID: Diane Gilbert, female    DOB: 01-29-1966, 53 y.o.   MRN: [id number]  Chief Complaint  Patient presents with   Follow-up    Referring provider: Nolene Ebbs, MD  HPI: 53 year old female, current every day smoker (45 pack year hx). PMH significant for respiratory bronchiolitis interstitial lung disease, OSA, chronic respiratory failure with hypoxia, chronic diastolic CHF, schizophrenia. Patient of Dr. Lake Bells, last seen in September 2019. Maintained on Symbicort 1 puffs twice daily. Advised to quit smoking altogether. PFTs in July 2019 that showed improvement. Needs 2L oxygen at rest and 4L on exertion.   02/06/2019 Patient presents today for 6 month follow-up. Complains of increased shortness of breath, wheezing and chest tightness x 2 months. She has only been using 2L oxygen at home. Unclear if she has been using her Symbicort twice daily as prescribed. Needs refill of her rescue inhaler, asking about a nebulizer. She has fallen at home in the shower when she wasn't wearing her oxygen. This does not appear new and reports falls during last visit. Recently increasing smoking to 1.5packs per days d/t depression. She has an apt with [facility] tomorrow.    Significant testing: HRCT 12/2013:  Mild GG, centrilobular nodularity  PFTs 2015:  FEV1 1.48 (56%), but normal ratio.  No BD response.  TLC 60%, mild airtrapping.  DLCO 45%, but corrects to normal with Av March 2017 pulmonary function testing ratio 85%, FVC 1.61 L (50% predicted) sees, FEV1 1.37 L (53% predicted), total lung capacity 3.14 L (58% predicted), DLCO 13.3 (49% predicted). July 2019   Bronch 03/2014:  BAL with 85 WBC's, 21% eos.  TBBX with normal lung parenchyma, but not enough for possible specific diagnosis VATS BX 06/2014:  RB-ILD (severe), ??? Component NSIP included in the differential  HP panel negative 03/2014 Autoimmune labs normal 07/2014  6MW March 2017 6 minute walk distance  144 m, O2 saturation nadir 81% on room air  Allergies  Allergen Reactions   Contrast Media [Iodinated Diagnostic Agents] Nausea And Vomiting    Immunization History  Administered Date(s) Administered   Influenza Split 04/03/2016, 04/30/2017   Influenza,inj,Quad PF,6+ Mos 03/12/2013, 06/07/2014, 05/13/2015, 07/26/2017, 03/16/2018   Pneumococcal Polysaccharide-23 07/27/2017    Past Medical History:  Diagnosis Date   Acute on chronic respiratory failure with hypoxia ([place]) 06/07/2014   Acute respiratory failure with hypoxia ([place]) 03/23/2017   Alcohol abuse    Anxiety    Arthritis    CHF (congestive heart failure) ([place])    Community acquired pneumonia    Depression    Headache(784.0)    Hypertension    Insomnia    Interstitial lung disease ([place])    Oxygen dependent    Respiratory failure, acute and chronic ([place]) 05/09/2017   Schizophrenia ([place])    Shortness of breath dyspnea    Sleep apnea    Sleep apnea     Tobacco History: Social History   Tobacco Use  Smoking Status Current Every Day Smoker   Packs/day: 1.50   Years: 30.00   Pack years: 45.00   Types: Cigarettes  Smokeless Tobacco Never Used  Tobacco Comment   smoking 3 cigarettes daily "every now and then" 11/04/16   Ready to quit: Not Answered Counseling given: Not Answered Comment: smoking 3 cigarettes daily "every now and then" 11/04/16   Outpatient Medications Prior to Visit  Medication Sig Dispense Refill   albuterol (PROVENTIL HFA;VENTOLIN HFA) 108 (90 BASE) MCG/ACT inhaler Inhale 2 puffs into the  lungs every 6 (six) hours as needed for wheezing or shortness of breath. 1 Inhaler 3   amLODipine (NORVASC) 5 MG tablet Take 1 tablet (5 mg total) by mouth daily. 30 tablet 1   diclofenac sodium (VOLTAREN) 1 % GEL Apply 2 g topically daily as needed (pain).     gabapentin (NEURONTIN) 300 MG capsule Take 300 mg by mouth 3 (three) times daily.     Misc. Devices MISC CPAP @@ Night      naproxen (NAPROSYN) 375 MG tablet Take 1 tablet (375 mg total) by mouth 2 (two) times daily. 20 tablet 0   nicotine polacrilex (NICORETTE) 2 MG gum Take 2 mg by mouth as needed for smoking cessation.     OXYGEN Inhale 4 L into the lungs continuous.      QUEtiapine (SEROQUEL) 300 MG tablet Take 1 tablet (300 mg total) by mouth at bedtime. (Patient taking differently: Take 300 mg by mouth at bedtime. 100 mg in the morning and 200 mg in the evening.) 30 tablet 1   rosuvastatin (CRESTOR) 10 MG tablet Take 1 tablet (10 mg total) by mouth daily. 30 tablet 1   tiZANidine (ZANAFLEX) 4 MG tablet Take 4 mg by mouth 2 (two) times daily as needed for muscle spasms.      traZODone (DESYREL) 100 MG tablet Take 100 mg by mouth at bedtime.     SYMBICORT 160-4.5 MCG/ACT inhaler INHALE TWO puffs into THE lungs TWICE DAILY 10.2 g 5   No facility-administered medications prior to visit.    Review of Systems  Review of Systems  Constitutional: Negative.   Respiratory: Positive for cough, shortness of breath and wheezing.   Cardiovascular: Negative.    Physical Exam  BP (!) 144/86 (BP Location: Left Arm, Cuff Size: Large)    Pulse 95    Temp 98.6 F (37 C) (Oral)    Ht 5' 4" (1.626 m)    Wt 228 lb (103.4 kg)    LMP 04/06/2017    SpO2 90%    BMI 39.14 kg/m  Physical Exam Constitutional:      Appearance: Normal appearance.  Cardiovascular:     Rate and Rhythm: Normal rate and regular rhythm.  Pulmonary:     Effort: Pulmonary effort is normal.     Breath sounds: Rhonchi present.  Neurological:     General: No focal deficit present.     Mental Status: She is alert and oriented to person, place, and time. Mental status is at baseline.  Psychiatric:        Mood and Affect: Mood normal.        Behavior: Behavior normal.        Thought Content: Thought content normal.        Judgment: Judgment normal.      Lab Results:  CBC    Component Value Date/Time   WBC 7.1 08/02/2018 1035   RBC 6.62  (H) 08/02/2018 1035   HGB 15.9 (H) 08/02/2018 1035   HCT 56.7 (H) 08/02/2018 1035   PLT 210 08/02/2018 1035   MCV 85.6 08/02/2018 1035   MCH 24.0 (L) 08/02/2018 1035   MCHC 28.0 (L) 08/02/2018 1035   RDW 19.6 (H) 08/02/2018 1035   LYMPHSABS 3.3 08/09/2017 1713   MONOABS 1.0 08/09/2017 1713   EOSABS 0.1 08/09/2017 1713   BASOSABS 0.0 08/09/2017 1713    BMET    Component Value Date/Time   NA 128 (L) 08/02/2018 1035   K 4.9 08/02/2018 1035  CL 88 (L) 08/02/2018 1035   CO2 27 08/02/2018 1035   GLUCOSE 114 (H) 08/02/2018 1035   BUN 18 08/02/2018 1035   CREATININE 1.07 (H) 08/02/2018 1035   CALCIUM 8.9 08/02/2018 1035   GFRNONAA >60 08/02/2018 1035   GFRAA >60 08/02/2018 1035    BNP    Component Value Date/Time   BNP 319.7 (H) 08/02/2018 1557    ProBNP    Component Value Date/Time   PROBNP 12.0 09/06/2015 1404    Imaging: Dg Bone Density (dxa)  Result Date: 01/27/2019 EXAM: DUAL X-RAY ABSORPTIOMETRY (DXA) FOR BONE MINERAL DENSITY IMPRESSION: Referring Physician:  Nolene Ebbs Your patient completed a BMD test using Lunar IDXA DXA system ( analysis version: 16 ) manufactured by [employer]. Technologist: AW PATIENT: Name: Everlee, [place] Patient ID: [id number] Birth Date: 1966/02/14 Height: 64.0 in. Sex: Female Measured: 01/27/2019 Weight: 211.0 lbs. Indications: Desyrel, Estrogen Deficient, Postmenopausal, Seroquel Fractures: None Treatments: None ASSESSMENT: The BMD measured at Femur Neck Right is 1.277 g/cm2 with a T-score of 1.7. This patient is considered normal according to [facility]) criteria. The scan quality is good. L-3 and L-4 were excluded due to surgical hardware. Site Region Measured Date Measured Age YA BMD Significant CHANGE T-score DualFemur Neck Right 01/27/2019    50.6         1.7     1.277 g/cm2 AP Spine  L1-L2      01/27/2019    50.6         3.0     1.521 g/cm2 DualFemur Total Mean 01/27/2019    50.6         2.3     1.300 g/cm2  [facility]) criteria for post-menopausal, Caucasian Women: Normal       T-score at or above -1 SD Osteopenia   T-score between -1 and -2.5 SD Osteoporosis T-score at or below -2.5 SD RECOMMENDATION: 1. All patients should optimize calcium and vitamin D intake. 2. Consider FDA approved medical therapies in postmenopausal women and men aged 60 years and older, based on the following: a. A hip or vertebral (clinical or morphometric) fracture b. T- score < or = -2.5 at the femoral neck or spine after appropriate evaluation to exclude secondary causes c. Low bone mass (T-score between -1.0 and -2.5 at the femoral neck or spine) and a 10 year probability of a hip fracture > or = 3% or a 10 year probability of a major osteoporosis-related fracture > or = 20% based on the [place]-adapted WHO algorithm d. Clinician judgment and/or patient preferences may indicate treatment for people with 10-year fracture probabilities above or below these levels FOLLOW-UP: Patients with diagnosis of osteoporosis or at high risk for fracture should have regular bone mineral density tests. For patients eligible for Medicare, routine testing is allowed once every 2 years. The testing frequency can be increased to one year for patients who have rapidly progressing disease, those who are receiving or discontinuing medical therapy to restore bone mass, or have additional risk factors. I have reviewed this report and agree with the above findings. [facility]iology Electronically Signed   By: Lajean Manes M.D.   On: 01/27/2019 12:21   Mm 3d Screen Breast Bilateral  Result Date: 01/27/2019 CLINICAL DATA:  Screening. EXAM: DIGITAL SCREENING BILATERAL MAMMOGRAM WITH TOMO AND CAD COMPARISON:  Previous exam(s). ACR Breast Density Category d: The breast tissue is extremely dense, which lowers the sensitivity of mammography FINDINGS: There are no findings suspicious  for malignancy. Images were processed with CAD. IMPRESSION: No  mammographic evidence of malignancy. A result letter of this screening mammogram will be mailed directly to the patient. RECOMMENDATION: Screening mammogram in one year. (Code:SM-B-01Y) BI-RADS CATEGORY  1: Negative. Electronically Signed   By: Ammie Ferrier M.D.   On: 01/27/2019 14:16     Assessment & Plan:   Respiratory bronchiolitis interstitial lung disease (HCC) - Increased shortness of breath, wheezing and chest tightness - Smoking MORE over the last 2 months d/t depression  - Strongly encourage patient quit smoking altogether  - Continue Symbicort 160 2 puffs twice daily  - Referral for nebulizer, RX albuterol q 6 hours prn sob  - Needs repeat HRCT  - Needs PFTs in 3 months  - FU in 3 months with new [facility] pulmonary provider (former [facility] patient)   Chronic respiratory failure with hypoxia ([place]) - Patient has not been wearing oxygen as prescribed  - Advised patient wear 2-4L O2 at all times to keep O2 >88%  Falls - Patient reports increase in falls at home - Syncopal episode when not wearing oxygen at home in the shower - Refer to home health and PT   Depression - Hx Schizophrenia - Following with [facility] psychiatry    Martyn Ehrich, NP 02/06/2019

## 2019-02-06 NOTE — Assessment & Plan Note (Addendum)
-  Increased shortness of breath, wheezing and chest tightness - Smoking MORE over the last 2 months d/t depression  - Strongly encourage patient quit smoking altogether  - Continue Symbicort 160 2 puffs twice daily  - Referral for nebulizer, RX albuterol q 6 hours prn sob  - Needs repeat HRCT  - Needs PFTs in 3 months  - FU in 3 months with new [facility] pulmonary provider (former [facility] patient)

## 2019-02-06 NOTE — Assessment & Plan Note (Addendum)
-  Hx Schizophrenia - Following with [facility] psychiatry

## 2019-02-08 ENCOUNTER — Telehealth: Payer: Self-pay

## 2019-02-08 DIAGNOSIS — R296 Repeated falls: Secondary | ICD-10-CM

## 2019-02-08 NOTE — Telephone Encounter (Signed)
Order for outpatient physical therapy has been placed.

## 2019-02-08 NOTE — Telephone Encounter (Signed)
-----  Message from Martyn Ehrich, NP sent at 02/08/2019  4:03 PM EDT ----- Regarding: RE: home health vs outpt rehab Please order patient for outpatient physical therapy/rehab re: freq falls  Can cancel home health/PT order. Does not qualify with her insurance  ----- Message ----- From: Ilona Sorrel Sent: 02/08/2019   3:48 PM EDT To: Martyn Ehrich, NP Subject: home health vs outpt rehab                     This is the pt I spoke to you about.  Not able to get home health agency now due to not taking medicaid but should be able to do outpatient rehab and will have more visits covered by insurance per one of the home health agencies I spoke to.   Diane Gilbert

## 2019-02-09 ENCOUNTER — Telehealth: Payer: Self-pay | Admitting: Pulmonary Disease

## 2019-02-09 NOTE — Telephone Encounter (Signed)
Order sent to [facility] already for this on 02/06/19- forwarding to them for update per protocol, thanks

## 2019-02-10 NOTE — Telephone Encounter (Signed)
Called [employer] & spoke to Diane Gilbert.  She states they have been trying to call pt but they get message that states phone is not taking calls.  I gave Diane Gilbert phone # for Diane Gilbert (caregiver) who called [place] to check status.  Diane Gilbert said they will contact pt today to see if she wants to pick up machine or have it shipped.  I called Diane Gilbert and she states pt has phone thru Obamacare and it is shut off right now.  She said to add her phone # [phone]) to pt's chart because she is usually with her.  Told her [employer] would call her today.  Nothing further needed.

## 2019-02-10 NOTE — Telephone Encounter (Signed)
I am calling [employer] to check status.

## 2019-02-11 NOTE — Progress Notes (Signed)
Reviewed, agree 

## 2019-02-14 ENCOUNTER — Ambulatory Visit: Payer: Medicaid Other | Admitting: Primary Care

## 2019-02-18 IMAGING — DX DG CHEST 2V
2 series · 2 of 2 positions shown · non-contrast
Comparison: Chest CT January 20, 2017 and chest radiograph January 20, 2017

CLINICAL DATA: Cough and fever

EXAM:
CHEST  2 VIEW

[chest lat]
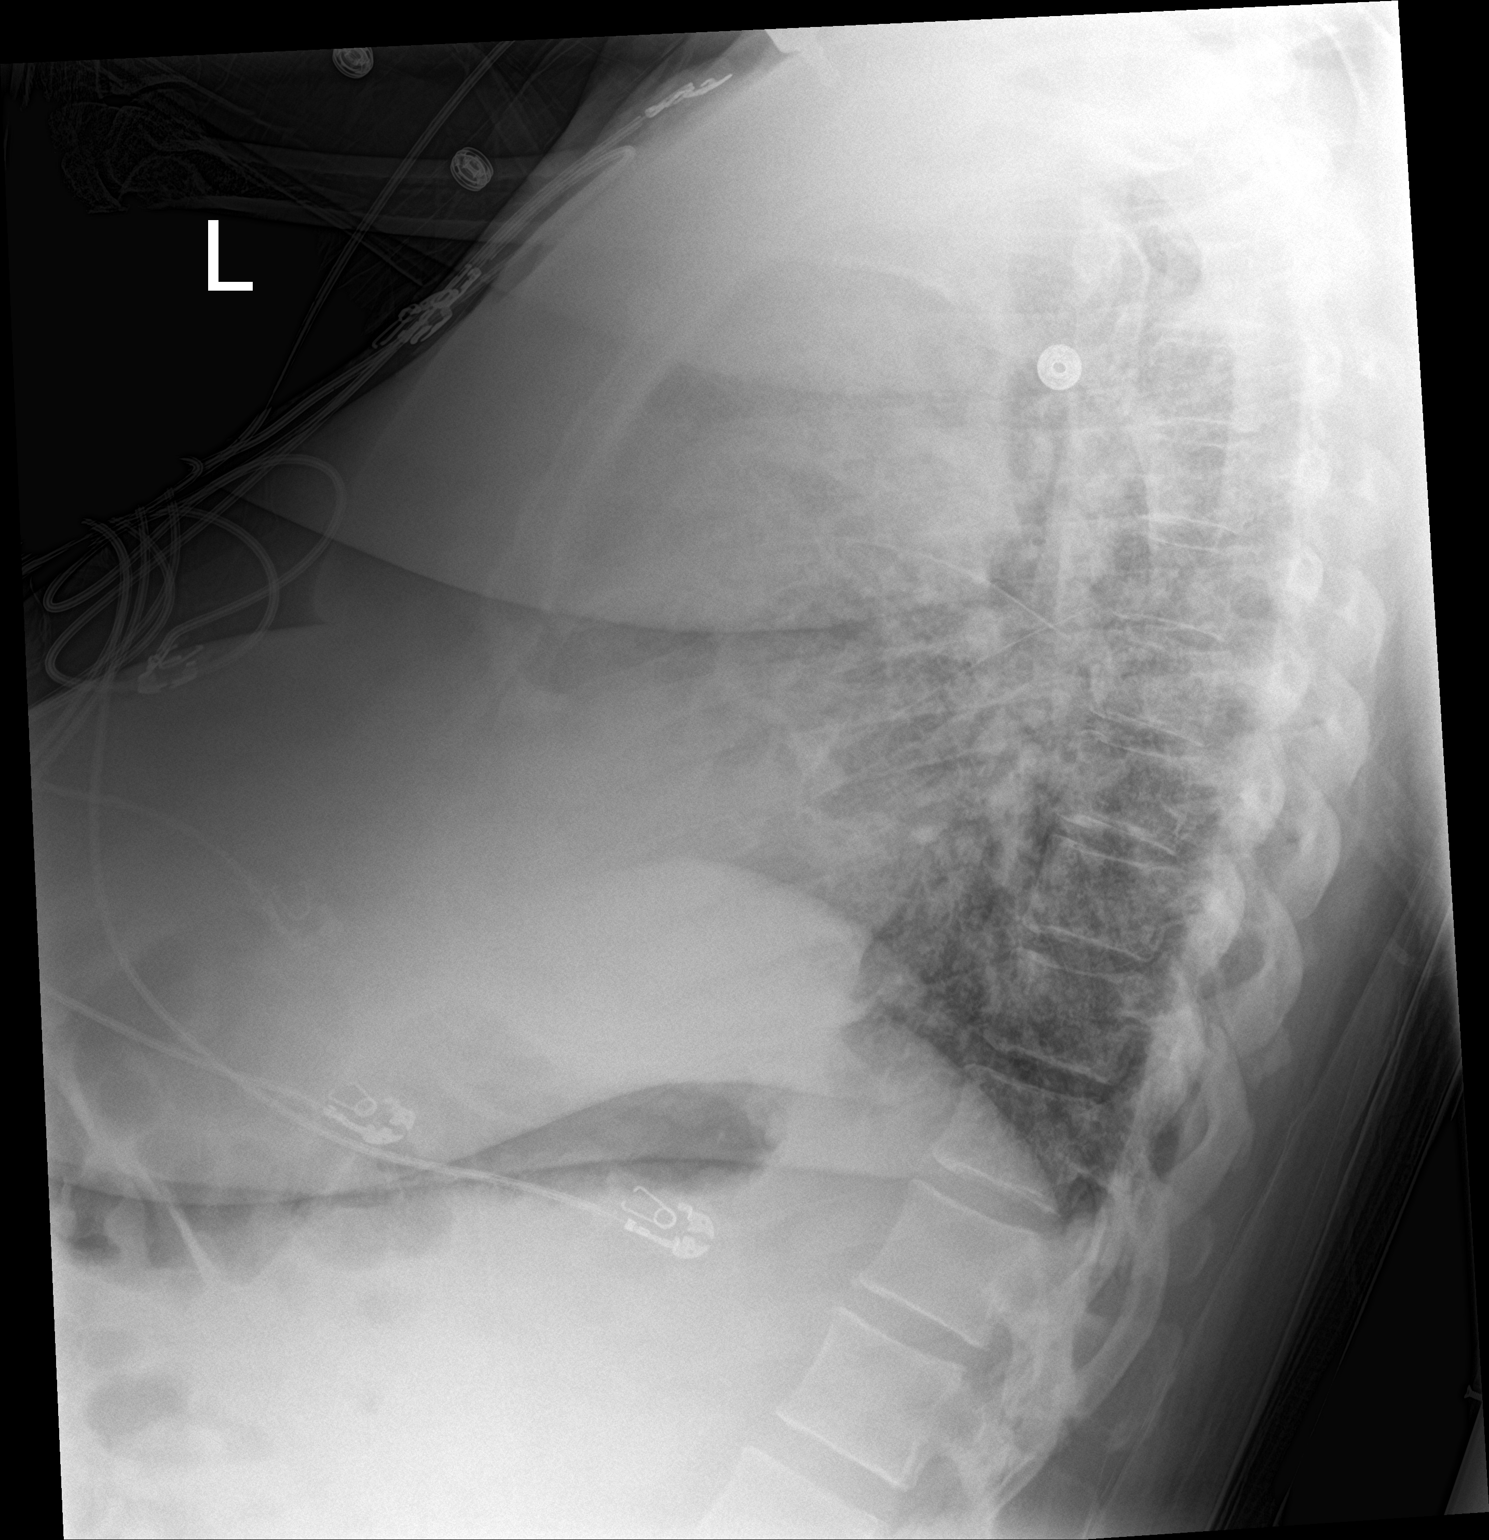

[chest ap]
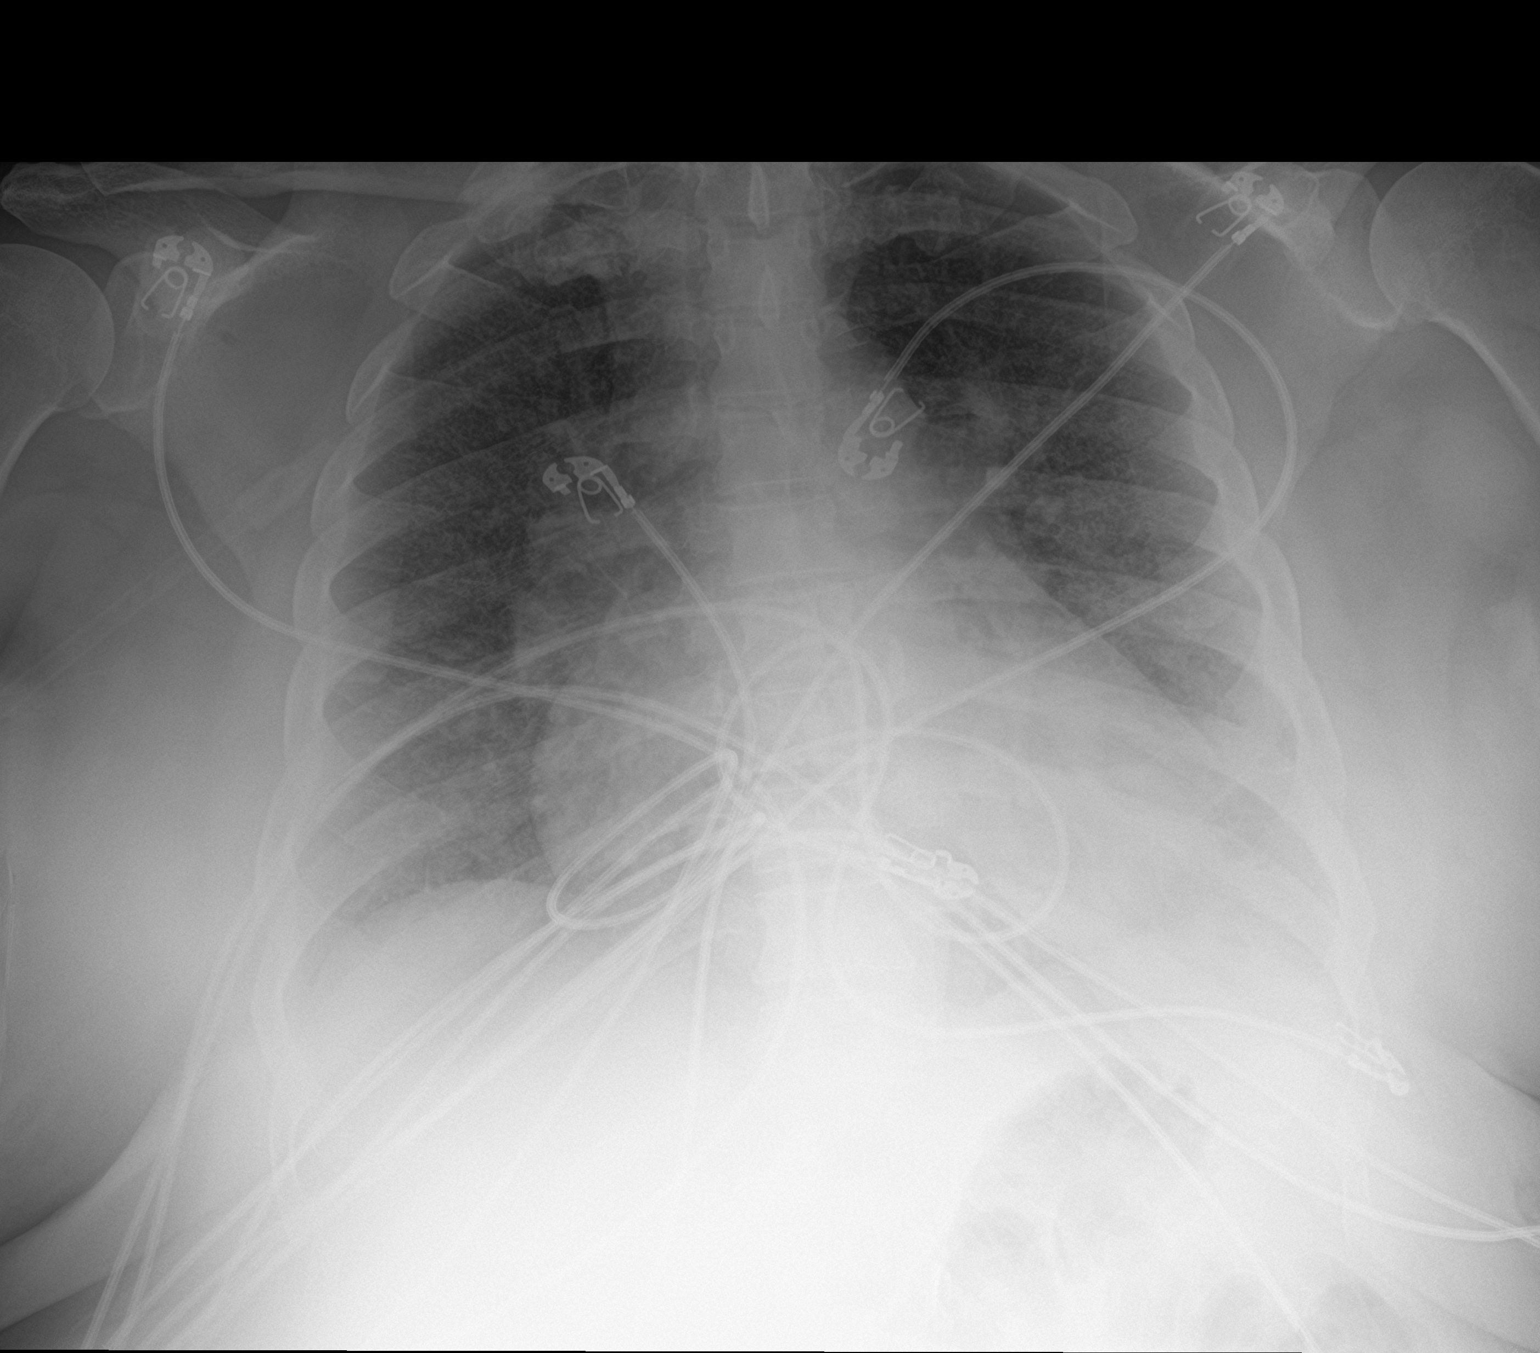

[2 of 2 positions shown; findings below may reference images not displayed]

FINDINGS: There is no edema or consolidation. Heart is enlarged with pulmonary
venous hypertension. No adenopathy. No evident bone lesions.
IMPRESSION: Evidence of pulmonary vascular congestion without edema or
consolidation.

## 2019-02-22 ENCOUNTER — Encounter: Payer: Self-pay | Admitting: Rehabilitative and Restorative Service Providers"

## 2019-02-22 ENCOUNTER — Ambulatory Visit: Payer: Medicaid Other | Attending: Primary Care | Admitting: Rehabilitative and Restorative Service Providers"

## 2019-02-22 ENCOUNTER — Other Ambulatory Visit: Payer: Self-pay

## 2019-02-22 ENCOUNTER — Ambulatory Visit (INDEPENDENT_AMBULATORY_CARE_PROVIDER_SITE_OTHER)
Admission: RE | Admit: 2019-02-22 | Discharge: 2019-02-22 | Disposition: A | Discharge status: Home / Self Care | Payer: Medicaid Other | Source: Ambulatory Visit | Attending: Primary Care | Admitting: Primary Care

## 2019-02-22 DIAGNOSIS — R2681 Unsteadiness on feet: Secondary | ICD-10-CM | POA: Insufficient documentation

## 2019-02-22 DIAGNOSIS — R2689 Other abnormalities of gait and mobility: Secondary | ICD-10-CM

## 2019-02-22 DIAGNOSIS — R296 Repeated falls: Secondary | ICD-10-CM | POA: Insufficient documentation

## 2019-02-22 DIAGNOSIS — M5440 Lumbago with sciatica, unspecified side: Secondary | ICD-10-CM | POA: Diagnosis present

## 2019-02-22 DIAGNOSIS — M6281 Muscle weakness (generalized): Secondary | ICD-10-CM | POA: Diagnosis present

## 2019-02-22 DIAGNOSIS — J849 Interstitial pulmonary disease, unspecified: Secondary | ICD-10-CM | POA: Diagnosis not present

## 2019-02-22 NOTE — Therapy (Signed)
[facility] [address] [place], [place], [address] Phone: [phone]   Fax:  [phone]  Physical Therapy Evaluation  Patient Details  Name: Diane Gilbert MRN: [id number] Date of Birth: 1965-12-29 Referring Provider (PT): Geraldo Pitter, MD   Encounter Date: 02/22/2019  PT End of Session - 02/22/19 1116    Visit Number  1    Number of Visits  12    Date for PT Re-Evaluation  04/23/19    Authorization Type  medicaid *authorization to be submitted    PT Start Time  1018    PT Stop Time  1100    PT Time Calculation (min)  42 min    Equipment Utilized During Treatment  Gait belt    Activity Tolerance  Patient limited by pain    Behavior During Therapy  [facility] for tasks assessed/performed       Past Medical History:  Diagnosis Date  . Acute on chronic respiratory failure with hypoxia ([place]) 06/07/2014  . Acute respiratory failure with hypoxia ([place]) 03/23/2017  . Alcohol abuse   . Anxiety   . Arthritis   . CHF (congestive heart failure) ([place])   . Community acquired pneumonia   . Depression   . Headache(784.0)   . Hypertension   . Insomnia   . Interstitial lung disease ([place])   . Oxygen dependent   . Respiratory failure, acute and chronic ([place]) 05/09/2017  . Schizophrenia ([place])   . Shortness of breath dyspnea   . Sleep apnea   . Sleep apnea     Past Surgical History:  Procedure Laterality Date  . BACK SURGERY    . LUNG BIOPSY Left 07/16/2014   Procedure: LUNG BIOPSY;  Surgeon: Grace Isaac, MD;  Location: [facility];  Service: Thoracic;  Laterality: Left;  Marland Kitchen MULTIPLE TOOTH EXTRACTIONS    . RIGHT/LEFT HEART CATH AND CORONARY ANGIOGRAPHY N/A 01/04/2018   Procedure: RIGHT/LEFT HEART CATH AND CORONARY ANGIOGRAPHY;  Surgeon: Adrian Prows, MD;  Location: [facility];  Service: Cardiovascular;  Laterality: N/A;  . VIDEO ASSISTED THORACOSCOPY Left 07/16/2014   Procedure: VIDEO ASSISTED THORACOSCOPY;   Surgeon: Grace Isaac, MD;  Location: [facility];  Service: Thoracic;  Laterality: Left;  Marland Kitchen VIDEO BRONCHOSCOPY Bilateral 04/11/2014   Procedure: VIDEO BRONCHOSCOPY WITH FLUORO;  Surgeon: Kathee Delton, MD;  Location: [facility];  Service: Cardiopulmonary;  Laterality: Bilateral;  . VIDEO BRONCHOSCOPY N/A 07/16/2014   Procedure: VIDEO BRONCHOSCOPY;  Surgeon: Grace Isaac, MD;  Location: [facility];  Service: Thoracic;  Laterality: N/A;  . WEDGE RESECTION Left 07/16/2014   Procedure: WEDGE RESECTION;  Surgeon: Grace Isaac, MD;  Location: [facility];  Service: Thoracic;  Laterality: Left;  left upper lobe lung resection    There were no vitals filed for this visit.   Subjective Assessment - 02/22/19 1024    Subjective  The patient reports that the right leg gives out frequently and the left leg gives out occasionally.  She reports an average # of falls 1x per week and has lost count on the # in the past 6 months.  She has R leg pain.    Pertinent History  CHF, schizophrenia, alcohol abuse h/o, interstitial lung disease, falls, depression    Patient Stated Goals  help with walking    Currently in Pain?  Yes    Pain Score  8     Pain Location  Leg    Pain Orientation  Right  Pain Descriptors / Indicators  Tingling;Aching    Pain Type  Chronic pain    Pain Radiating Towards  also up into R sciatic notch region    Pain Onset  More than a month ago    Pain Frequency  Constant    Aggravating Factors   activity; lying down makes it worse    Pain Relieving Factors  unsure         [facility] PT Assessment - 02/22/19 1030      Assessment   Medical Diagnosis  frequent falls    Referring Provider (PT)  Geraldo Pitter, MD    Onset Date/Surgical Date  --   worsening in recent months   Prior Therapy  none      Precautions   Precautions  Fall;Other (comment)    Precaution Comments  4 L O2 via nasal cannula      Restrictions   Weight Bearing Restrictions  No      Balance Screen   Has the  patient fallen in the past 6 months  Yes    How many times?  unable to count; estimates at least 1 fall per week on average    Has the patient had a decrease in activity level because of a fear of falling?   Yes    Is the patient reluctant to leave their home because of a fear of falling?   Yes      [place] residence    [place] to enter    Entrance Stairs-Number of Steps  [place]  One level    [place] - 2 wheels;[place] - single point      Prior Function   Level of Independence  Independent with household mobility without device;Independent with community mobility without device    Leisure  Notes dizziness limits her when she wakes up and when she stands up  "it just comes on."  Sometimes her "body gets hot" and the dizziness stays with her all day then.      Sensation   Light Touch  --   tingling in bilateral feet     Posture/Postural Control   Posture/Postural Control  Postural limitations    Postural Limitations  Rounded Shoulders      ROM / Strength   AROM / PROM / Strength  AROM;Strength      AROM   Overall AROM Comments  WFLs      Strength   Overall Strength Comments  5/5 bilateral UEs for shoulder flexion, abduction and elbow flexion.  4/5 bilateral hip flexion, 5/5 bilateral knee flexion/extension and ankle dorsivlexion       Bed Mobility   Bed Mobility  --   sleeps on the couch     Transfers   Transfers  Sit to Stand;Stand to Sit    Sit to Stand  6: Modified independent (Device/Increase time)    Stand to Sit  6: Modified independent (Device/Increase time)      Ambulation/Gait   Ambulation/Gait  Yes    Ambulation/Gait Assistance  6: Modified independent (Device/Increase time)   slowed pace   Ambulation Distance (Feet)  150 Feet    Assistive device  None    Gait Pattern  --   R antalgic, short stride   Ambulation Surface  Level;Indoor    Gait velocity  2.14  ft/sec      Standardized  Balance Assessment   Standardized Balance Assessment  Berg Balance Test      Berg Balance Test   Sit to Stand  Able to stand  independently using hands    Standing Unsupported  Able to stand safely 2 minutes    Sitting with Back Unsupported but Feet Supported on Floor or Stool  Able to sit safely and securely 2 minutes    Stand to Sit  Sits safely with minimal use of hands    Transfers  Able to transfer safely, definite need of hands    Standing Unsupported with Eyes Closed  Able to stand 10 seconds safely    Standing Unsupported with Feet Together  Able to place feet together independently and stand 1 minute safely    From Standing, Reach Forward with Outstretched Arm  Can reach forward >12 cm safely (5")    From Standing Position, Pick up Object from Floor  Able to pick up shoe safely and easily    From Standing Position, Turn to Look Behind Over each Shoulder  Turn sideways only but maintains balance    Turn 360 Degrees  Able to turn 360 degrees safely but slowly    Standing Unsupported, Alternately Place Feet on Step/Stool  Able to stand independently and safely and complete 8 steps in 20 seconds    Standing Unsupported, One Foot in Front  Able to take small step independently and hold 30 seconds    Standing on One Leg  Able to lift leg independently and hold 5-10 seconds    Total Score  46    Berg comment:  46/56                Objective measurements completed on examination: See above findings.      [facility] Adult PT Treatment/Exercise - 02/22/19 1030      Exercises   Exercises  Other Exercises    Other Exercises   The patient is unable to tolerate lying supine, therefore PT modified using wedge.  We performed supine marching, supine lumbar rocking, hip ER/IR in hooklying.  Seated hamstring stretch.             PT Education - 02/22/19 1114    Education Details  The patient and PT discussed focus of therapy on stretching, improving  mobility and improving safety    Person(s) Educated  Patient    Methods  Explanation    Comprehension  Verbalized understanding       PT Short Term Goals - 02/22/19 1119      PT SHORT TERM GOAL #1   Title  The patient will be able to return demo HEP for LE stretching, LE strengthening, balance and general mobility.    Baseline  Does not exercise    Time  3    Period  Weeks    Target Date  03/24/19      PT SHORT TERM GOAL #2   Title  The patient will be able to improve gait speed from 2.14 ft/sec to > or equal to 2.6 ft/sec to demo improving community mobility.    Baseline  2.14 ft/sec    Time  3    Period  Weeks    Target Date  03/24/19      PT SHORT TERM GOAL #3   Title  The patient will tolerate lying supine to be able to sleep in her bed to demo improving mobility.    Baseline  sleeps sitting upright/reclined on couch  Time  3    Period  Weeks    Target Date  03/24/19        PT Long Term Goals - 02/22/19 1121      PT LONG TERM GOAL #1   Title  The patient will be indep with HEP progression.    Baseline  no current exercise    Time  8    Period  Weeks    Target Date  04/23/19      PT LONG TERM GOAL #2   Title  The patient will improve Berg score from 46/56 to > or equal to 50/56 to demo dec'ing risk for falls.    Baseline  46/56    Time  8    Period  Weeks    Target Date  04/23/19      PT LONG TERM GOAL #3   Title  The patient will imrpove gait speed from 2.14 ft/sec to > or equal to 2.9 ft/sec to demo improving functional mobility.    Baseline  2.14 ft/sec    Time  8    Period  Weeks    Target Date  04/23/19      PT LONG TERM GOAL #4   Title  The patient will report no falls in a 3 week period.    Baseline  falls on average 1x per week    Time  8    Period  Weeks    Target Date  04/23/19      PT LONG TERM GOAL #5   Title  The patient will report pain in low back and R leg < or equal to 5/10    Baseline  8/10 in right leg and back    Time  8     Period  Weeks    Target Date  04/23/19             Plan - 02/22/19 1126    Clinical Impression Statement  The patient is a 53 yo female referred to OP rehab on 02/08/2019.  She presents with frequent falls, 4/5 hip flexor strength, pain with walking, pain at rest, inability to sleep in her bed due to discomfort, and sensory disturbance R leg.  PT to address deficits and promote improved functional moblity.    Personal Factors and Comorbidities  Behavior Pattern;Comorbidity 1;Comorbidity 2;Comorbidity 3+    Comorbidities  pain avoidance behavior pattern, CHF, oxygen use,  h/o frequent falls, h/o alcohol abuse    Examination-Activity Limitations  Bed Mobility;Locomotion Level;Stairs    Examination-Participation Restrictions  Cleaning;Community Activity;Shop    Stability/Clinical Decision Making  Evolving/Moderate complexity    Clinical Decision Making  Moderate    Rehab Potential  Good    PT Frequency  1x / week    PT Duration  3 weeks   then 2x/week x 4 weeks   PT Treatment/Interventions  ADLs/Self Care Home Management;Neuromuscular re-education;Patient/family education;Gait training;Stair training;Functional mobility training;Therapeutic exercise;Balance training;Manual techniques;Passive range of motion;Spinal Manipulations;Joint Manipulations    PT Next Visit Plan  Establish HEP:  hamstring stretch, work on tolerating supine as able, standing walking encouraging frequent walking in the home, piriformis stretching    Consulted and Agree with Plan of Care  Patient       Patient will benefit from skilled therapeutic intervention in order to improve the following deficits and impairments:  Abnormal gait, Decreased activity tolerance, Decreased balance, Decreased mobility, Impaired sensation, Decreased strength, Pain, Impaired flexibility  Visit Diagnosis: Other abnormalities of gait and mobility  Low back pain with sciatica, sciatica laterality unspecified, unspecified back pain  laterality, unspecified chronicity  Muscle weakness (generalized)  Unsteadiness on feet  Repeated falls     Problem List Patient Active Problem List   Diagnosis Date Noted  . Falls 02/06/2019  . Depression 02/06/2019  . Chronic diastolic CHF (congestive heart failure) ([place]) 05/09/2017  . Facial swelling 03/23/2017  . Chronic respiratory failure with hypoxia ([place]) 03/23/2017  . OSA (obstructive sleep apnea) 07/21/2016  . Acute diastolic congestive heart failure ([place])   . Schizophrenia ([place]) 03/24/2016  . Leg swelling 01/16/2016  . Interstitial lung disease ([place]) 07/16/2014  . Respiratory bronchiolitis interstitial lung disease ([place]) 12/26/2013  . Irregular menstrual cycle 01/23/2013  . Smoking addiction 01/23/2013  . Alcohol abuse 02/19/2012  . Hyponatremia 02/19/2012  . Abdominal pain 02/19/2012  . Hypokalemia 02/19/2012    Charle Clear, PT 02/22/2019, 11:34 AM  [facility] [address] [place], [place], [address] Phone: [phone]   Fax:  [phone]  Name: Diane Gilbert MRN: [id number] Date of Birth: 1966/05/14

## 2019-02-23 NOTE — Progress Notes (Signed)
HRCT stable

## 2019-02-26 ENCOUNTER — Emergency Department (HOSPITAL_COMMUNITY): Payer: Medicaid Other

## 2019-02-26 ENCOUNTER — Inpatient Hospital Stay (HOSPITAL_COMMUNITY)
Admission: EM | Admit: 2019-02-26 | Discharge: 2019-03-01 | DRG: 190 | Disposition: A | Discharge status: Home / Self Care | Payer: Medicaid Other | Attending: Internal Medicine | Admitting: Internal Medicine

## 2019-02-26 ENCOUNTER — Encounter (HOSPITAL_COMMUNITY): Payer: Self-pay | Admitting: Emergency Medicine

## 2019-02-26 ENCOUNTER — Other Ambulatory Visit: Payer: Self-pay

## 2019-02-26 DIAGNOSIS — F419 Anxiety disorder, unspecified: Secondary | ICD-10-CM | POA: Diagnosis present

## 2019-02-26 DIAGNOSIS — Z91041 Radiographic dye allergy status: Secondary | ICD-10-CM

## 2019-02-26 DIAGNOSIS — Z6838 Body mass index (BMI) 38.0-38.9, adult: Secondary | ICD-10-CM

## 2019-02-26 DIAGNOSIS — Z7951 Long term (current) use of inhaled steroids: Secondary | ICD-10-CM | POA: Diagnosis not present

## 2019-02-26 DIAGNOSIS — J84115 Respiratory bronchiolitis interstitial lung disease: Secondary | ICD-10-CM | POA: Diagnosis present

## 2019-02-26 DIAGNOSIS — I5032 Chronic diastolic (congestive) heart failure: Secondary | ICD-10-CM | POA: Diagnosis not present

## 2019-02-26 DIAGNOSIS — Z79899 Other long term (current) drug therapy: Secondary | ICD-10-CM

## 2019-02-26 DIAGNOSIS — J432 Centrilobular emphysema: Secondary | ICD-10-CM | POA: Diagnosis present

## 2019-02-26 DIAGNOSIS — J9601 Acute respiratory failure with hypoxia: Secondary | ICD-10-CM | POA: Diagnosis present

## 2019-02-26 DIAGNOSIS — I5033 Acute on chronic diastolic (congestive) heart failure: Secondary | ICD-10-CM | POA: Diagnosis present

## 2019-02-26 DIAGNOSIS — I272 Pulmonary hypertension, unspecified: Secondary | ICD-10-CM | POA: Diagnosis present

## 2019-02-26 DIAGNOSIS — Z791 Long term (current) use of non-steroidal anti-inflammatories (NSAID): Secondary | ICD-10-CM

## 2019-02-26 DIAGNOSIS — E669 Obesity, unspecified: Secondary | ICD-10-CM | POA: Diagnosis present

## 2019-02-26 DIAGNOSIS — F17200 Nicotine dependence, unspecified, uncomplicated: Secondary | ICD-10-CM | POA: Diagnosis not present

## 2019-02-26 DIAGNOSIS — Z825 Family history of asthma and other chronic lower respiratory diseases: Secondary | ICD-10-CM

## 2019-02-26 DIAGNOSIS — Z716 Tobacco abuse counseling: Secondary | ICD-10-CM | POA: Diagnosis not present

## 2019-02-26 DIAGNOSIS — F203 Undifferentiated schizophrenia: Secondary | ICD-10-CM | POA: Diagnosis not present

## 2019-02-26 DIAGNOSIS — T783XXA Angioneurotic edema, initial encounter: Secondary | ICD-10-CM | POA: Diagnosis not present

## 2019-02-26 DIAGNOSIS — J9621 Acute and chronic respiratory failure with hypoxia: Secondary | ICD-10-CM

## 2019-02-26 DIAGNOSIS — F10230 Alcohol dependence with withdrawal, uncomplicated: Secondary | ICD-10-CM | POA: Diagnosis not present

## 2019-02-26 DIAGNOSIS — Z9981 Dependence on supplemental oxygen: Secondary | ICD-10-CM

## 2019-02-26 DIAGNOSIS — J441 Chronic obstructive pulmonary disease with (acute) exacerbation: Secondary | ICD-10-CM | POA: Diagnosis present

## 2019-02-26 DIAGNOSIS — Z20828 Contact with and (suspected) exposure to other viral communicable diseases: Secondary | ICD-10-CM | POA: Diagnosis present

## 2019-02-26 DIAGNOSIS — F209 Schizophrenia, unspecified: Secondary | ICD-10-CM | POA: Diagnosis present

## 2019-02-26 DIAGNOSIS — T360X5A Adverse effect of penicillins, initial encounter: Secondary | ICD-10-CM | POA: Diagnosis not present

## 2019-02-26 DIAGNOSIS — T886XXA Anaphylactic reaction due to adverse effect of correct drug or medicament properly administered, initial encounter: Secondary | ICD-10-CM | POA: Diagnosis not present

## 2019-02-26 DIAGNOSIS — F1490 Cocaine use, unspecified, uncomplicated: Secondary | ICD-10-CM | POA: Diagnosis present

## 2019-02-26 DIAGNOSIS — F1010 Alcohol abuse, uncomplicated: Secondary | ICD-10-CM | POA: Diagnosis present

## 2019-02-26 DIAGNOSIS — J9620 Acute and chronic respiratory failure, unspecified whether with hypoxia or hypercapnia: Secondary | ICD-10-CM | POA: Diagnosis present

## 2019-02-26 DIAGNOSIS — Z803 Family history of malignant neoplasm of breast: Secondary | ICD-10-CM

## 2019-02-26 DIAGNOSIS — J40 Bronchitis, not specified as acute or chronic: Secondary | ICD-10-CM | POA: Diagnosis not present

## 2019-02-26 DIAGNOSIS — Y92230 Patient room in hospital as the place of occurrence of the external cause: Secondary | ICD-10-CM | POA: Diagnosis not present

## 2019-02-26 DIAGNOSIS — J811 Chronic pulmonary edema: Secondary | ICD-10-CM

## 2019-02-26 DIAGNOSIS — I10 Essential (primary) hypertension: Secondary | ICD-10-CM | POA: Diagnosis present

## 2019-02-26 DIAGNOSIS — Z7952 Long term (current) use of systemic steroids: Secondary | ICD-10-CM

## 2019-02-26 DIAGNOSIS — J189 Pneumonia, unspecified organism: Secondary | ICD-10-CM | POA: Diagnosis present

## 2019-02-26 DIAGNOSIS — R1310 Dysphagia, unspecified: Secondary | ICD-10-CM

## 2019-02-26 DIAGNOSIS — G4733 Obstructive sleep apnea (adult) (pediatric): Secondary | ICD-10-CM | POA: Diagnosis not present

## 2019-02-26 DIAGNOSIS — F329 Major depressive disorder, single episode, unspecified: Secondary | ICD-10-CM | POA: Diagnosis present

## 2019-02-26 DIAGNOSIS — F17210 Nicotine dependence, cigarettes, uncomplicated: Secondary | ICD-10-CM | POA: Diagnosis present

## 2019-02-26 DIAGNOSIS — J9622 Acute and chronic respiratory failure with hypercapnia: Secondary | ICD-10-CM | POA: Diagnosis present

## 2019-02-26 DIAGNOSIS — Z720 Tobacco use: Secondary | ICD-10-CM | POA: Diagnosis not present

## 2019-02-26 DIAGNOSIS — E872 Acidosis: Secondary | ICD-10-CM | POA: Diagnosis present

## 2019-02-26 DIAGNOSIS — L539 Erythematous condition, unspecified: Secondary | ICD-10-CM | POA: Diagnosis not present

## 2019-02-26 DIAGNOSIS — Z9119 Patient's noncompliance with other medical treatment and regimen: Secondary | ICD-10-CM

## 2019-02-26 LAB — RAPID URINE DRUG SCREEN, HOSP PERFORMED
Amphetamines: NOT DETECTED
Barbiturates: NOT DETECTED
Benzodiazepines: NOT DETECTED
Cocaine: NOT DETECTED
Opiates: NOT DETECTED
Tetrahydrocannabinol: NOT DETECTED

## 2019-02-26 LAB — CBC WITH DIFFERENTIAL/PLATELET
Abs Immature Granulocytes: 0.02 10*3/uL (ref 0.00–0.07)
Basophils Absolute: 0.1 10*3/uL (ref 0.0–0.1)
Basophils Relative: 1 %
Eosinophils Absolute: 0.1 10*3/uL (ref 0.0–0.5)
Eosinophils Relative: 1 %
HCT: 65.1 % — ABNORMAL HIGH (ref 36.0–46.0)
Hemoglobin: 17.3 g/dL — ABNORMAL HIGH (ref 12.0–15.0)
Immature Granulocytes: 0 %
Lymphocytes Relative: 45 %
Lymphs Abs: 4.1 10*3/uL — ABNORMAL HIGH (ref 0.7–4.0)
MCH: 22.4 pg — ABNORMAL LOW (ref 26.0–34.0)
MCHC: 26.6 g/dL — ABNORMAL LOW (ref 30.0–36.0)
MCV: 84.4 fL (ref 80.0–100.0)
Monocytes Absolute: 0.7 10*3/uL (ref 0.1–1.0)
Monocytes Relative: 7 %
Neutro Abs: 4.2 10*3/uL (ref 1.7–7.7)
Neutrophils Relative %: 46 %
Platelets: 238 10*3/uL (ref 150–400)
RBC: 7.71 MIL/uL — ABNORMAL HIGH (ref 3.87–5.11)
RDW: 22.7 % — ABNORMAL HIGH (ref 11.5–15.5)
WBC: 9.1 10*3/uL (ref 4.0–10.5)
nRBC: 0 % (ref 0.0–0.2)

## 2019-02-26 LAB — TROPONIN I (HIGH SENSITIVITY): Troponin I (High Sensitivity): 12 ng/L (ref <18)

## 2019-02-26 LAB — BASIC METABOLIC PANEL
Anion gap: 12 (ref 5–15)
BUN: 9 mg/dL (ref 6–20)
CO2: 35 mmol/L — ABNORMAL HIGH (ref 22–32)
Calcium: 9 mg/dL (ref 8.9–10.3)
Chloride: 93 mmol/L — ABNORMAL LOW (ref 98–111)
Creatinine, Ser: 0.65 mg/dL (ref 0.44–1.00)
GFR calc Af Amer: >60 mL/min (ref >60)
GFR calc non Af Amer: >60 mL/min (ref >60)
Glucose, Bld: 102 mg/dL — ABNORMAL HIGH (ref 70–99)
Potassium: 4.5 mmol/L (ref 3.5–5.1)
Sodium: 140 mmol/L (ref 135–145)

## 2019-02-26 LAB — BRAIN NATRIURETIC PEPTIDE: B Natriuretic Peptide: 246.7 pg/mL — ABNORMAL HIGH (ref 0.0–100.0)

## 2019-02-26 LAB — SARS CORONAVIRUS 2 BY RT PCR (HOSPITAL ORDER, PERFORMED IN ~~LOC~~ HOSPITAL LAB): SARS Coronavirus 2: NEGATIVE

## 2019-02-26 MED ORDER — ACETAMINOPHEN 650 MG RE SUPP: 650.0000 mg | Freq: Four times a day (QID) | RECTAL | Status: DC | PRN

## 2019-02-26 MED ORDER — ACETAMINOPHEN 325 MG PO TABS: 650.0000 mg | ORAL_TABLET | Freq: Four times a day (QID) | ORAL | Status: DC | PRN

## 2019-02-26 MED ORDER — SODIUM CHLORIDE 0.9 % IV SOLN
250.0000 mL | INTRAVENOUS | Status: DC | PRN
  Administered 2019-02-27: 250 mL via INTRAVENOUS

## 2019-02-26 MED ORDER — FUROSEMIDE 10 MG/ML IJ SOLN
20.0000 mg | Freq: Once | INTRAMUSCULAR | Status: AC
  Administered 2019-02-26: 20 mg via INTRAVENOUS
  Filled 2019-02-26: qty 2

## 2019-02-26 MED ORDER — ENOXAPARIN SODIUM 40 MG/0.4ML ~~LOC~~ SOLN
40.0000 mg | Freq: Every day | SUBCUTANEOUS | Status: DC
  Administered 2019-02-27: 10:00:00 40 mg via SUBCUTANEOUS
  Filled 2019-02-26: qty 0.4

## 2019-02-26 MED ORDER — METHYLPREDNISOLONE SODIUM SUCC 125 MG IJ SOLR
125.0000 mg | Freq: Once | INTRAMUSCULAR | Status: AC
  Administered 2019-02-26: 125 mg via INTRAVENOUS
  Filled 2019-02-26: qty 2

## 2019-02-26 MED ORDER — IPRATROPIUM BROMIDE HFA 17 MCG/ACT IN AERS
2.0000 | INHALATION_SPRAY | Freq: Once | RESPIRATORY_TRACT | Status: DC
  Filled 2019-02-26: qty 12.9

## 2019-02-26 MED ORDER — IPRATROPIUM-ALBUTEROL 20-100 MCG/ACT IN AERS
1.0000 | INHALATION_SPRAY | Freq: Once | RESPIRATORY_TRACT | Status: AC
  Administered 2019-02-26: 1 via RESPIRATORY_TRACT
  Filled 2019-02-26: qty 4

## 2019-02-26 MED ORDER — SODIUM CHLORIDE 0.9% FLUSH
3.0000 mL | Freq: Two times a day (BID) | INTRAVENOUS | Status: DC
  Administered 2019-02-27 – 2019-03-01 (×4): 3 mL via INTRAVENOUS

## 2019-02-26 MED ORDER — SODIUM CHLORIDE 0.9% FLUSH: 3.0000 mL | INTRAVENOUS | Status: DC | PRN

## 2019-02-26 MED ORDER — ALBUTEROL SULFATE HFA 108 (90 BASE) MCG/ACT IN AERS
8.0000 | INHALATION_SPRAY | Freq: Once | RESPIRATORY_TRACT | Status: AC
  Administered 2019-02-26: 20:00:00 8 via RESPIRATORY_TRACT
  Filled 2019-02-26: qty 6.7

## 2019-02-26 NOTE — ED Provider Notes (Signed)
8:03 PM Patient seen in conjunction with [place] PA-C and Dr. Gilford Raid. Pt with PMH significant for respiratory bronchiolitis interstitial lung disease, OSA, chronic respiratory failure with hypoxia, chronic diastolic CHF, schizophrenia.   Pt with history of interstitial lung disease, pulmonary hypertension, oxygen dependent --presents with worsening shortness of breath since this morning.  She states that she had an issue with her oxygen tank and had to borrow one from a fire station today.  Patient was off her oxygen at some point today and EMS was called.  Patient with very low initial oxygen saturations.  Patient is currently up to 88-90% on 6 L nasal cannula.  Patient is somewhat short of breath but able to talk on the phone.  She denies any chest pain at this point.  Pt stable at the current time. Labs/x-ray pending. Will give albuterol.  Patient with decreased air movement, wheezing throughout lungs with crackles at her bilateral bases.  No clinical concern for DVT.  She denies CP.   BP (!) 180/103 (BP Location: Right Arm)   Pulse (!) 114   Temp 98.4 F (36.9 C) (Oral)   Resp (!) 24   Ht _0  (1.626 m)   Wt 100.7 kg   LMP 04/06/2017   SpO2 91%   BMI 38.11 kg/m   11:19 PM COVID negative.   11:27 PM Spoke with Dr. Maudie Mercury who will see in ED.  Patient stable on re-exam.   BP (!) 168/92   Pulse 95   Temp 98.4 F (36.9 C) (Oral)   Resp (!) 24   Ht _1  (1.626 m)   Wt 100.7 kg   LMP 04/06/2017   SpO2 91%   BMI 38.11 kg/m       Carlisle Cater, PA-C 02/26/19 2332    Isla Pence, MD 02/28/19 [phone]

## 2019-02-26 NOTE — ED Triage Notes (Signed)
EMS called for shortness of breath. Ran out of home O2. Had no oxygen for about 6 hours. EMS reports initial sats were 42%. Sats came up to 94% on 6L per nasal cannula. C/O swelling and pain in right leg

## 2019-02-26 NOTE — H&P (Signed)
TRH H&P    Patient Demographics:    Diane Gilbert, is a 54 y.o. female  MRN: [id number]  DOB - July 02, 1965  Admit Date - 02/26/2019  Referring MD/NP/PA:   Carlisle Cater  Outpatient Primary MD for the patient is Nolene Ebbs, MD  Patient coming from:  home  Chief complaint-   dyspnea   HPI:    Diane Gilbert  is a 53 y.o. female, w schizophrenia, anxiety/ depression, , h/o Etoh abuse, hypertension, Copd on home O2 2L [place], CHF (diastolic), OSA apparently ran out of o2 at home.  Pt had no oxygen for about 6 hours,  When EMS arrived her O2 sat was 42% and then came up to 94% with 6L [place].  Pt notes increase in dyspnea over the past few days w slight cough w white sputum.  Pt denies fever, chills, cp, palp, n/v, abd pain, diarrhea, brbpr, black stool.    CXR IMPRESSION: Increased interstitial opacities may be due to mild edema versus atypical infection.  UDS negative  BNP 246.7  Na 140, K 4.5, Bun 9, Creatinine 0.65 Wbc 9.1, Hgb 17.3, Plt 238  covid -19 negative  Trop  12  EKG st at 110, Lad, Q in v1-4  Pt will be admitted for acute respiratory failure with hypoxia secondary to Copd exacerbation.      Review of systems:    In addition to the HPI above,  No Fever-chills, No Headache, No changes with Vision or hearing, No problems swallowing food or Liquids, No Chest pain, No Abdominal pain, No Nausea or Vomiting, bowel movements are regular, No Blood in stool or Urine, No dysuria, No new skin rashes or bruises, No new joints pains-aches,  No new weakness, tingling, numbness in any extremity, No recent weight gain or loss, No polyuria, polydypsia or polyphagia, No significant Mental Stressors.  All other systems reviewed and are negative.    Past History of the following :    Past Medical History:  Diagnosis Date   Acute on chronic respiratory failure with hypoxia  ([place]) 06/07/2014   Acute respiratory failure with hypoxia (HCC) 03/23/2017   Alcohol abuse    Anxiety    Arthritis    CHF (congestive heart failure) ([place])    Community acquired pneumonia    Depression    Headache(784.0)    Hypertension    Insomnia    Interstitial lung disease ([place])    Oxygen dependent    Respiratory failure, acute and chronic ([place]) 05/09/2017   Schizophrenia ([place])    Shortness of breath dyspnea    Sleep apnea    Sleep apnea       Past Surgical History:  Procedure Laterality Date   BACK SURGERY     LUNG BIOPSY Left 07/16/2014   Procedure: LUNG BIOPSY;  Surgeon: Grace Isaac, MD;  Location: [facility];  Service: Thoracic;  Laterality: Left;   MULTIPLE TOOTH EXTRACTIONS     RIGHT/LEFT HEART CATH AND CORONARY ANGIOGRAPHY N/A 01/04/2018   Procedure: RIGHT/LEFT HEART CATH AND CORONARY ANGIOGRAPHY;  Surgeon: Einar Gip,  Ulice Dash, MD;  Location: [facility];  Service: Cardiovascular;  Laterality: N/A;   VIDEO ASSISTED THORACOSCOPY Left 07/16/2014   Procedure: VIDEO ASSISTED THORACOSCOPY;  Surgeon: Grace Isaac, MD;  Location: [facility];  Service: Thoracic;  Laterality: Left;   VIDEO BRONCHOSCOPY Bilateral 04/11/2014   Procedure: VIDEO BRONCHOSCOPY WITH FLUORO;  Surgeon: Kathee Delton, MD;  Location: [facility];  Service: Cardiopulmonary;  Laterality: Bilateral;   VIDEO BRONCHOSCOPY N/A 07/16/2014   Procedure: VIDEO BRONCHOSCOPY;  Surgeon: Grace Isaac, MD;  Location: [facility];  Service: Thoracic;  Laterality: N/A;   WEDGE RESECTION Left 07/16/2014   Procedure: WEDGE RESECTION;  Surgeon: Grace Isaac, MD;  Location: [facility] OR;  Service: Thoracic;  Laterality: Left;  left upper lobe lung resection      Social History:      Social History   Tobacco Use   Smoking status: Current Every Day Smoker    Packs/day: 1.50    Years: 30.00    Pack years: 45.00    Types: Cigarettes   Smokeless tobacco: Never Used   Tobacco comment: smoking 3  cigarettes daily "every now and then" 11/04/16  Substance Use Topics   Alcohol use: Yes    Alcohol/week: 0.0 standard drinks    Frequency: Never    Comment: occasional        Family History :     Family History  Problem Relation Age of Onset   Breast cancer Mother    Obstructive Sleep Apnea Mother    COPD Maternal Aunt        Home Medications:   Prior to Admission medications   Medication Sig Start Date End Date Taking? Authorizing Provider  albuterol (PROVENTIL HFA;VENTOLIN HFA) 108 (90 BASE) MCG/ACT inhaler Inhale 2 puffs into the lungs every 6 (six) hours as needed for wheezing or shortness of breath. 05/09/15  Yes Juanito Doom, MD  albuterol (PROVENTIL) (2.5 MG/3ML) 0.083% nebulizer solution Take 3 mLs (2.5 mg total) by nebulization every 6 (six) hours as needed for wheezing or shortness of breath. 02/06/19  Yes Martyn Ehrich, NP  ARIPiprazole (ABILIFY) 5 MG tablet Take 5 mg by mouth daily. 02/23/19  Yes [provider]  budesonide-formoterol (SYMBICORT) 160-4.5 MCG/ACT inhaler INHALE TWO puffs into THE lungs TWICE DAILY Patient taking differently: Inhale 2 puffs into the lungs 2 (two) times daily.  02/06/19  Yes Martyn Ehrich, NP  buPROPion (WELLBUTRIN SR) 150 MG 12 hr tablet Take 150 mg by mouth daily. 02/14/19  Yes [provider]  gabapentin (NEURONTIN) 300 MG capsule Take 300 mg by mouth 3 (three) times daily.   Yes [provider]  ibuprofen (ADVIL) 800 MG tablet Take 400 mg by mouth every 8 (eight) hours as needed (pain).   Yes [provider]  nicotine (NICODERM CQ - DOSED IN MG/24 HR) 7 mg/24hr patch Place 7 mg onto the skin daily as needed (smoking cessation).   Yes [provider]  nicotine polacrilex (NICORETTE) 2 MG gum Take 2 mg by mouth as needed for smoking cessation.   Yes [provider]  OXYGEN Inhale 4 L into the lungs continuous.    Yes [provider]  predniSONE (DELTASONE) 10 MG  tablet Take 4 tabs po daily x 3 days; then 3 tabs daily x3 days; then 2 tabs daily x3 days; then 1 tab daily x 3 days; then stop Patient taking differently: Take 10-40 mg by mouth See admin instructions. Take 4 tabs (40 mg)  by mouth daily x 3 days; then 3 tabs (30 mg) daily x3 days; then 2 tabs (20 mg) daily x3 days; then 1 tab (10 mg) daily x 3 days; then stop 02/06/19  Yes Martyn Ehrich, NP  QUEtiapine (SEROQUEL) 100 MG tablet Take 100 mg by mouth every morning. 01/31/19  Yes [provider]  QUEtiapine (SEROQUEL) 200 MG tablet Take 200 mg by mouth at bedtime. 02/11/19  Yes [provider]  traZODone (DESYREL) 100 MG tablet Take 100 mg by mouth at bedtime.   Yes [provider]  amLODipine (NORVASC) 5 MG tablet Take 1 tablet (5 mg total) by mouth daily. Patient not taking: Reported on 02/26/2019 01/17/18   Blanchie Dessert, MD  naproxen (NAPROSYN) 375 MG tablet Take 1 tablet (375 mg total) by mouth 2 (two) times daily. Patient not taking: Reported on 02/26/2019 12/05/18   Wieters, Hallie C, PA-C  QUEtiapine (SEROQUEL) 300 MG tablet Take 1 tablet (300 mg total) by mouth at bedtime. Patient not taking: Reported on 02/26/2019 06/18/17   Arfeen, Arlyce Harman, MD  rosuvastatin (CRESTOR) 10 MG tablet Take 1 tablet (10 mg total) by mouth daily. Patient not taking: Reported on 02/26/2019 01/17/18   Blanchie Dessert, MD     Allergies:     Allergies  Allergen Reactions   Contrast Media [Iodinated Diagnostic Agents] Nausea And Vomiting     Physical Exam:   Vitals  Blood pressure (!) 168/92, pulse 95, temperature 98.4 F (36.9 C), temperature source Oral, resp. rate (!) 24, height _0  (1.626 m), weight 100.7 kg, last menstrual period 04/06/2017, SpO2 91 %.  1.  General: axoxo3  2. Psychiatric: euthymic  3. Neurologic: cn2-12 intact, reflexes 2+ symmetric, diffuse with no clonus, motor 5/5 in all 4 ext Right distal lower ext, slight tremor 4. HEENMT:  Anicteric, pupils  1.61m symmetric, direct, consensual, near intact Neck: equivocal jvd  5. Respiratory : + bilateral exp wheezing, + bibasilar crackles  6. Cardiovascular : rrr s1, s2,   7. Gastrointestinal:  ABd: soft, obese, nt, nd, +bs  8. Skin:  Ext: no c/c/e  9.Musculoskeletal:  Good ROM,  No adenopathy    Data Review:    CBC Recent Labs  Lab 02/26/19 2110  WBC 9.1  HGB 17.3*  HCT 65.1*  PLT 238  MCV 84.4  MCH 22.4*  MCHC 26.6*  RDW 22.7*  LYMPHSABS 4.1*  MONOABS 0.7  EOSABS 0.1  BASOSABS 0.1   ------------------------------------------------------------------------------------------------------------------  Results for orders placed or performed during the hospital encounter of 02/26/19 (from the past 48 hour(s))  Rapid urine drug screen (hospital performed)     Status: None   Collection Time: 02/26/19  7:55 PM  Result Value Ref Range   Opiates NONE DETECTED NONE DETECTED   Cocaine NONE DETECTED NONE DETECTED   Benzodiazepines NONE DETECTED NONE DETECTED   Amphetamines NONE DETECTED NONE DETECTED   Tetrahydrocannabinol NONE DETECTED NONE DETECTED   Barbiturates NONE DETECTED NONE DETECTED    Comment: (NOTE) DRUG SCREEN FOR MEDICAL PURPOSES ONLY.  IF CONFIRMATION IS NEEDED FOR ANY PURPOSE, NOTIFY LAB WITHIN 5 DAYS. LOWEST DETECTABLE LIMITS FOR URINE DRUG SCREEN Drug Class                     Cutoff (ng/mL) Amphetamine and metabolites    1000 Barbiturate and metabolites    200 Benzodiazepine                 2195Tricyclics and metabolites  300 Opiates and metabolites        300 Cocaine and metabolites        300 THC                            50 Performed at [facility], [place] [address], [place], [place] [address]   Brain natriuretic peptide     Status: Abnormal   Collection Time: 02/26/19  9:10 PM  Result Value Ref Range   B Natriuretic Peptide 246.7 (H) 0.0 - 100.0 pg/mL    Comment: Performed at [facility] [address] [place],  [place] [address]  Basic metabolic panel     Status: Abnormal   Collection Time: 02/26/19  9:10 PM  Result Value Ref Range   Sodium 140 135 - 145 mmol/L   Potassium 4.5 3.5 - 5.1 mmol/L   Chloride 93 (L) 98 - 111 mmol/L   CO2 35 (H) 22 - 32 mmol/L   Glucose, Bld 102 (H) 70 - 99 mg/dL   BUN 9 6 - 20 mg/dL   Creatinine, Ser 0.65 0.44 - 1.00 mg/dL   Calcium 9.0 8.9 - 10.3 mg/dL   GFR calc non Af Amer >60 >60 mL/min   GFR calc Af Amer >60 >60 mL/min   Anion gap 12 5 - 15    Comment: Performed at [facility], [place] [address], [place], [place] [address]  Troponin I (High Sensitivity)     Status: None   Collection Time: 02/26/19  9:10 PM  Result Value Ref Range   Troponin I (High Sensitivity) 12 <18 ng/L    Comment: (NOTE) Elevated high sensitivity troponin I (hsTnI) values and significant  changes across serial measurements may suggest ACS but many other  chronic and acute conditions are known to elevate hsTnI results.  Refer to the "Links" section for chest pain algorithms and additional  guidance. Performed at [facility], [place] [address], [place], [place] [address]   CBC with Differential     Status: Abnormal   Collection Time: 02/26/19  9:10 PM  Result Value Ref Range   WBC 9.1 4.0 - 10.5 K/uL   RBC 7.71 (H) 3.87 - 5.11 MIL/uL   Hemoglobin 17.3 (H) 12.0 - 15.0 g/dL   HCT 65.1 (H) 36.0 - 46.0 %   MCV 84.4 80.0 - 100.0 fL   MCH 22.4 (L) 26.0 - 34.0 pg   MCHC 26.6 (L) 30.0 - 36.0 g/dL   RDW 22.7 (H) 11.5 - 15.5 %   Platelets 238 150 - 400 K/uL   nRBC 0.0 0.0 - 0.2 %   Neutrophils Relative % 46 %   Neutro Abs 4.2 1.7 - 7.7 K/uL   Lymphocytes Relative 45 %   Lymphs Abs 4.1 (H) 0.7 - 4.0 K/uL   Monocytes Relative 7 %   Monocytes Absolute 0.7 0.1 - 1.0 K/uL   Eosinophils Relative 1 %   Eosinophils Absolute 0.1 0.0 - 0.5 K/uL   Basophils Relative 1 %   Basophils Absolute 0.1 0.0 - 0.1 K/uL   RBC Morphology MORPHOLOGY UNREMARKABLE    Immature Granulocytes 0 %   Abs  Immature Granulocytes 0.02 0.00 - 0.07 K/uL    Comment: Performed at [facility], [place] [address], [place], [place] [address]  SARS Coronavirus 2 [facility] order, Performed in [facility] hospital lab) Nasopharyngeal Nasopharyngeal Swab     Status: None  Collection Time: 02/26/19  9:32 PM   Specimen: Nasopharyngeal Swab  Result Value Ref Range   SARS Coronavirus 2 NEGATIVE NEGATIVE    Comment: (NOTE) If result is NEGATIVE SARS-CoV-2 target nucleic acids are NOT DETECTED. The SARS-CoV-2 RNA is generally detectable in upper and lower  respiratory specimens during the acute phase of infection. The lowest  concentration of SARS-CoV-2 viral copies this assay can detect is 250  copies / mL. A negative result does not preclude SARS-CoV-2 infection  and should not be used as the sole basis for treatment or other  patient management decisions.  A negative result may occur with  improper specimen collection / handling, submission of specimen other  than nasopharyngeal swab, presence of viral mutation(s) within the  areas targeted by this assay, and inadequate number of viral copies  (<250 copies / mL). A negative result must be combined with clinical  observations, patient history, and epidemiological information. If result is POSITIVE SARS-CoV-2 target nucleic acids are DETECTED. The SARS-CoV-2 RNA is generally detectable in upper and lower  respiratory specimens dur ing the acute phase of infection.  Positive  results are indicative of active infection with SARS-CoV-2.  Clinical  correlation with patient history and other diagnostic information is  necessary to determine patient infection status.  Positive results do  not rule out bacterial infection or co-infection with other viruses. If result is PRESUMPTIVE POSTIVE SARS-CoV-2 nucleic acids MAY BE PRESENT.   A presumptive positive result was obtained on the submitted specimen  and confirmed on repeat testing.  While 2019 novel  coronavirus  (SARS-CoV-2) nucleic acids may be present in the submitted sample  additional confirmatory testing may be necessary for epidemiological  and / or clinical management purposes  to differentiate between  SARS-CoV-2 and other Sarbecovirus currently known to infect humans.  If clinically indicated additional testing with an alternate test  methodology [phone]) is advised. The SARS-CoV-2 RNA is generally  detectable in upper and lower respiratory sp ecimens during the acute  phase of infection. The expected result is Negative. Fact Sheet for Patients:  StrictlyIdeas.no Fact Sheet for Healthcare Providers: BankingDealers.co.za This test is not yet approved or cleared by the [place] FDA and has been authorized for detection and/or diagnosis of SARS-CoV-2 by FDA under an Emergency Use Authorization (EUA).  This EUA will remain in effect (meaning this test can be used) for the duration of the COVID-19 declaration under Section 564(b)(1) of the Act, 21 U.S.C. section 360bbb-3(b)(1), unless the authorization is terminated or revoked sooner. Performed at [facility], [place] [address] [place], [place] [address]     Chemistries  Recent Labs  Lab 02/26/19 2110  NA 140  K 4.5  CL 93*  CO2 35*  GLUCOSE 102*  BUN 9  CREATININE 0.65  CALCIUM 9.0   ------------------------------------------------------------------------------------------------------------------  ------------------------------------------------------------------------------------------------------------------ GFR: Estimated Creatinine Clearance: 97.1 mL/min (by C-G formula based on SCr of 0.65 mg/dL). Liver Function Tests: No results for input(s): AST, ALT, ALKPHOS, BILITOT, PROT, ALBUMIN in the last 168 hours. No results for input(s): LIPASE, AMYLASE in the last 168 hours. No results for input(s): AMMONIA in the last 168 hours. Coagulation  Profile: No results for input(s): INR, PROTIME in the last 168 hours. Cardiac Enzymes: No results for input(s): CKTOTAL, CKMB, CKMBINDEX, TROPONINI in the last 168 hours. BNP (last 3 results) No results for input(s): PROBNP in the last 8760 hours. HbA1C: No results for input(s): HGBA1C in the last 72 hours. CBG: No results for  input(s): GLUCAP in the last 168 hours. Lipid Profile: No results for input(s): CHOL, HDL, LDLCALC, TRIG, CHOLHDL, LDLDIRECT in the last 72 hours. Thyroid Function Tests: No results for input(s): TSH, T4TOTAL, FREET4, T3FREE, THYROIDAB in the last 72 hours. Anemia Panel: No results for input(s): VITAMINB12, FOLATE, FERRITIN, TIBC, IRON, RETICCTPCT in the last 72 hours.  --------------------------------------------------------------------------------------------------------------- Urine analysis:    Component Value Date/Time   COLORURINE YELLOW 08/02/2018 1745   APPEARANCEUR CLOUDY (A) 08/02/2018 1745   LABSPEC 1.019 08/02/2018 1745   PHURINE 5.0 08/02/2018 1745   GLUCOSEU NEGATIVE 08/02/2018 1745   HGBUR SMALL (A) 08/02/2018 1745   BILIRUBINUR NEGATIVE 08/02/2018 1745   KETONESUR 5 (A) 08/02/2018 1745   PROTEINUR 100 (A) 08/02/2018 1745   UROBILINOGEN 0.2 07/13/2014 1122   NITRITE NEGATIVE 08/02/2018 1745   LEUKOCYTESUR NEGATIVE 08/02/2018 1745      Imaging Results:    Dg Chest Port 1 View  Result Date: 02/26/2019 CLINICAL DATA:  Shortness of breath. EXAM: PORTABLE CHEST 1 VIEW COMPARISON:  August 02, 2018 FINDINGS: Stable cardiomegaly. The hila and mediastinum are unchanged. No pneumothorax. Increased interstitial opacities are identified. IMPRESSION: Increased interstitial opacities may be due to mild edema versus atypical infection. Electronically Signed   By: Dorise Bullion III M.D   On: 02/26/2019 20:16      Assessment & Plan:    Principal Problem:   Acute respiratory failure with hypoxia ([place]) Active Problems:   COPD with acute  exacerbation (HCC)   OSA (obstructive sleep apnea)   Chronic diastolic CHF (congestive heart failure) (HCC)  Acute respiratory failure with hypoxia Secondary to Copd exacerbation, CHF Solumedrol 22m iv q8h zithromax 501miv qday incruse 1puff qday Cont symbicort-> dulera 2puff bid Albuterol neb q6h and q6h prn   Acute on Chronic Diastolic CHF (EF 6099-24%Mild pulmonary hypertension on R/L heart cath 01/04/2018 -> negative CAD Lasix iv x1 in ED Start Lasix 2076mv qday Start Losartan 31m42m qday Hydralazine 10mg69mq6h prn sbp >160 Strict I/o Check daily weight  Schizophrenia/ Depression/ Anxiety Cont abilify 5mg p68mday Cont seroquel 100mg p76mm, and 200mg po34m Cont Wellbutrin SR 131mg po 43mCont Trazodone 100mg po q72mTobacco use Nicotine patch and nicorette gum prn  Pt counselled to stop smoking x 3 minutes  DVT Prophylaxis-   Lovenox - SCDs   AM Labs Ordered, also please review Full Orders  Family Communication: Admission, patients condition and plan of care including tests being ordered have been discussed with the patient who indicate understanding and agree with the plan and Code Status.  Code Status:  FULL CODE  Admission status:  Inpatient: Based on patients clinical presentation and evaluation of above clinical data, I have made determination that patient meets Inpatient criteria at this time.  Pt has pox of 42% on RA, has acute respiratory failure with hypoxia,  Pt will require solumedrol iv and zithromax iv for Copd exacerbation . Pt will require > 2 nites stay.  Due to high risk of clinical deterioration.   Time spent in minutes :  70  Burman Bruington Jani Gravel30/2020 at 11:52 PM

## 2019-02-26 NOTE — ED Provider Notes (Signed)
[facility] EMERGENCY DEPARTMENT Provider Note   CSN: [id number] Arrival date & time: 02/26/19  1913     History   Chief Complaint No chief complaint on file.   HPI Diane Gilbert is a 53 y.o. female. History of  Patient presents for acute shortness of breath that began this morning on her way to church.  Patient is oxygen dependent at 4 L by nasal cannula.  Patient states she felt faint earlier in the day and then progressively short of breath.  Patient endorses increase in the number of times she feels faint and presyncopal.  States she used to pass out but was taken off Lasix a year ago.    Patient states "I sometimes take my Symbicort."  Patient denies chest pain, abdominal pain, acute vision changes, weakness, fever, chills, bowel or bladder incontinence, urinary symptoms, states her legs are no more swollen than usual.   Patient endorses cocaine use occasionally but states last use was a week ago.  Patient denies any opioid pain medication or other illicit drug use.    HPI  Past Medical History:  Diagnosis Date  . Acute on chronic respiratory failure with hypoxia ([place]) 06/07/2014  . Acute respiratory failure with hypoxia ([place]) 03/23/2017  . Alcohol abuse   . Anxiety   . Arthritis   . CHF (congestive heart failure) ([place])   . Community acquired pneumonia   . Depression   . Headache(784.0)   . Hypertension   . Insomnia   . Interstitial lung disease ([place])   . Oxygen dependent   . Respiratory failure, acute and chronic ([place]) 05/09/2017  . Schizophrenia ([place])   . Shortness of breath dyspnea   . Sleep apnea   . Sleep apnea     Patient Active Problem List   Diagnosis Date Noted  . Falls 02/06/2019  . Depression 02/06/2019  . Chronic diastolic CHF (congestive heart failure) ([place]) 05/09/2017  . Facial swelling 03/23/2017  . Chronic respiratory failure with hypoxia ([place]) 03/23/2017  . OSA (obstructive sleep apnea) 07/21/2016  . Acute diastolic  congestive heart failure ([place])   . Schizophrenia ([place]) 03/24/2016  . Leg swelling 01/16/2016  . Interstitial lung disease ([place]) 07/16/2014  . Respiratory bronchiolitis interstitial lung disease ([place]) 12/26/2013  . Irregular menstrual cycle 01/23/2013  . Smoking addiction 01/23/2013  . Alcohol abuse 02/19/2012  . Hyponatremia 02/19/2012  . Abdominal pain 02/19/2012  . Hypokalemia 02/19/2012    Past Surgical History:  Procedure Laterality Date  . BACK SURGERY    . LUNG BIOPSY Left 07/16/2014   Procedure: LUNG BIOPSY;  Surgeon: Grace Isaac, MD;  Location: [facility];  Service: Thoracic;  Laterality: Left;  Marland Kitchen MULTIPLE TOOTH EXTRACTIONS    . RIGHT/LEFT HEART CATH AND CORONARY ANGIOGRAPHY N/A 01/04/2018   Procedure: RIGHT/LEFT HEART CATH AND CORONARY ANGIOGRAPHY;  Surgeon: Adrian Prows, MD;  Location: [facility];  Service: Cardiovascular;  Laterality: N/A;  . VIDEO ASSISTED THORACOSCOPY Left 07/16/2014   Procedure: VIDEO ASSISTED THORACOSCOPY;  Surgeon: Grace Isaac, MD;  Location: [facility];  Service: Thoracic;  Laterality: Left;  Marland Kitchen VIDEO BRONCHOSCOPY Bilateral 04/11/2014   Procedure: VIDEO BRONCHOSCOPY WITH FLUORO;  Surgeon: Kathee Delton, MD;  Location: [facility];  Service: Cardiopulmonary;  Laterality: Bilateral;  . VIDEO BRONCHOSCOPY N/A 07/16/2014   Procedure: VIDEO BRONCHOSCOPY;  Surgeon: Grace Isaac, MD;  Location: [facility];  Service: Thoracic;  Laterality: N/A;  . WEDGE RESECTION Left 07/16/2014   Procedure: WEDGE RESECTION;  Surgeon: Grace Isaac, MD;  Location: [facility];  Service: Thoracic;  Laterality: Left;  left upper lobe lung resection     OB History    Gravida  0   Para  0   Term  0   Preterm  0   AB  0   Living  0     SAB  0   TAB  0   Ectopic  0   Multiple  0   Live Births               Home Medications    Prior to Admission medications   Medication Sig Start Date End Date Taking? Authorizing Provider  albuterol (PROVENTIL  HFA;VENTOLIN HFA) 108 (90 BASE) MCG/ACT inhaler Inhale 2 puffs into the lungs every 6 (six) hours as needed for wheezing or shortness of breath. 05/09/15   Juanito Doom, MD  albuterol (PROVENTIL) (2.5 MG/3ML) 0.083% nebulizer solution Take 3 mLs (2.5 mg total) by nebulization every 6 (six) hours as needed for wheezing or shortness of breath. 02/06/19   Martyn Ehrich, NP  amLODipine (NORVASC) 5 MG tablet Take 1 tablet (5 mg total) by mouth daily. 01/17/18   Blanchie Dessert, MD  budesonide-formoterol [facility]) 160-4.5 MCG/ACT inhaler INHALE TWO puffs into THE lungs TWICE DAILY 02/06/19   Martyn Ehrich, NP  diclofenac sodium (VOLTAREN) 1 % GEL Apply 2 g topically daily as needed (pain).    [provider]  gabapentin (NEURONTIN) 300 MG capsule Take 300 mg by mouth 3 (three) times daily.    [provider]  Misc. Devices MISC CPAP @@ Night    [provider]  naproxen (NAPROSYN) 375 MG tablet Take 1 tablet (375 mg total) by mouth 2 (two) times daily. 12/05/18   Wieters, Hallie C, PA-C  nicotine polacrilex (NICORETTE) 2 MG gum Take 2 mg by mouth as needed for smoking cessation.    [provider]  OXYGEN Inhale 4 L into the lungs continuous.     [provider]  predniSONE (DELTASONE) 10 MG tablet Take 4 tabs po daily x 3 days; then 3 tabs daily x3 days; then 2 tabs daily x3 days; then 1 tab daily x 3 days; then stop 02/06/19   Martyn Ehrich, NP  QUEtiapine (SEROQUEL) 300 MG tablet Take 1 tablet (300 mg total) by mouth at bedtime. Patient taking differently: Take 300 mg by mouth at bedtime. 100 mg in the morning and 200 mg in the evening. 06/18/17   Arfeen, Arlyce Harman, MD  rosuvastatin (CRESTOR) 10 MG tablet Take 1 tablet (10 mg total) by mouth daily. 01/17/18   Blanchie Dessert, MD  tiZANidine (ZANAFLEX) 4 MG tablet Take 4 mg by mouth 2 (two) times daily as needed for muscle spasms.     [provider]  traZODone (DESYREL) 100 MG tablet  Take 100 mg by mouth at bedtime.    [provider]    Family History Family History  Problem Relation Age of Onset  . Breast cancer Mother   . Obstructive Sleep Apnea Mother   . COPD Maternal Aunt     Social History Social History   Tobacco Use  . Smoking status: Current Every Day Smoker    Packs/day: 1.50    Years: 30.00    Pack years: 45.00    Types: Cigarettes  . Smokeless tobacco: Never Used  . Tobacco comment: smoking 3 cigarettes daily "every now and then" 11/04/16  Substance Use Topics  .  Alcohol use: Yes    Alcohol/week: 0.0 standard drinks    Frequency: Never    Comment: occasional   . Drug use: Yes    Types: Cocaine     Allergies   Contrast media [iodinated diagnostic agents]   Review of Systems Review of Systems  Constitutional: Negative for chills and fever.  HENT: Negative for congestion and sinus pressure.   Eyes: Negative for photophobia and redness.  Respiratory: Positive for cough and shortness of breath.   Cardiovascular: Negative for chest pain and palpitations.  Gastrointestinal: Negative for abdominal pain, diarrhea, nausea and vomiting.  Genitourinary: Negative for dysuria and hematuria.  Musculoskeletal: Negative for arthralgias.  Skin: Negative for rash.  Neurological: Positive for dizziness, syncope (Intermittently for the past year) and weakness. Negative for headaches.  Psychiatric/Behavioral: Positive for decreased concentration (Patient states this is been ongoing).     Physical Exam Updated Vital Signs BP (!) 180/103 (BP Location: Right Arm)   Pulse (!) 114   Temp 98.4 F (36.9 C) (Oral)   Resp (!) 24   Ht _0  (1.626 m)   Wt 100.7 kg   LMP 04/06/2017   SpO2 91%   BMI 38.11 kg/m   Physical Exam Constitutional:      General: She is not in acute distress. HENT:     Head: Normocephalic and atraumatic.     Right Ear: External ear normal.     Left Ear: External ear normal.     Nose: Nose normal. No congestion.   Eyes:     Extraocular Movements: Extraocular movements intact.     Pupils: Pupils are equal, round, and reactive to light.     Comments: Pupillary constriction sluggish  Neck:     Musculoskeletal: Normal range of motion.  Cardiovascular:     Rate and Rhythm: Normal rate and regular rhythm.     Heart sounds: No murmur. No friction rub. No gallop.   Pulmonary:     Effort: Tachypnea present. No respiratory distress.     Breath sounds: Decreased air movement present. No stridor. Examination of the right-upper field reveals wheezing. Examination of the left-upper field reveals wheezing. Examination of the right-middle field reveals rales. Examination of the left-middle field reveals rales. Examination of the right-lower field reveals rales. Examination of the left-lower field reveals rales. Wheezing, rhonchi and rales present.  Abdominal:     General: Bowel sounds are normal.     Palpations: Abdomen is soft.     Tenderness: There is no abdominal tenderness. There is no guarding.  Musculoskeletal:     Right lower leg: Edema (1+) present.     Left lower leg: Edema (1+) present.  Skin:    General: Skin is warm and dry.  Neurological:     Mental Status: She is alert. Mental status is at baseline.     Cranial Nerves: No cranial nerve deficit.  Psychiatric:     Comments: Patient is slow to complete sentences.  Alert and oriented to day of week, month, year location and self.      ED Treatments / Results  Labs (all labs ordered are listed, but only abnormal results are displayed) Labs Reviewed  SARS CORONAVIRUS 2 ([facility])  BRAIN NATRIURETIC PEPTIDE  RAPID URINE DRUG SCREEN, HOSP PERFORMED  BASIC METABOLIC PANEL  CBC WITH DIFFERENTIAL/PLATELET  TROPONIN I (HIGH SENSITIVITY)    EKG None  Radiology No results found.  Procedures Procedures (including critical care time)  Medications Ordered  in ED Medications - No data to display    Initial Impression / Assessment and Plan / ED Course  I have reviewed the triage vital signs and the nursing notes.  Pertinent labs & imaging results that were available during my care of the patient were reviewed by me and considered in my medical decision making (see chart for details).  Clinical Course as of Feb 25 2238  Nancy Fetter Feb 26, 2019  2021 Reassess patient.  States she is warm and continues to be short of breath   [WF]  2127 DG Chest [place] 1 View [WF]  2237 Platelets: 238 [WF]    Clinical Course User Index [WF] Tedd Sias, [place]   Patient summary: Patient is 53 year old female with history of interstitial lung disease, OSA, COPD and CHF presenting for shortness of breath.  Patient is oxygen dependent at 2 to 4 L.  Differential includes respiratory (COPD, asthma, pneumonia, primary pulmonary hypertension) causes of shortness of breath.  Most likely an exacerbation of her COPD, viral infection or due to noncompliance with Symbicort.  Chest x-ray consistent with pulmonary edema we will continue oxygen therapy.  Initial troponin is below concerning limits.  ACS is unlikely at this time.  On reassessment after Atrovent/albuterol patient has decreased work of breathing although she states she feels warm however lung auscultation is unchanged from initial presentation.   Patient given albuterol, Atrovent and Solu-Medrol with mild improvement in symptoms. Patient still states "my lungs are hurting" during re-evaluation.   Patient will be admitted to hospitalist team for observation.      Final Clinical Impressions(s) / ED Diagnoses   Final diagnoses:  None    ED Discharge Orders    None       Tedd Sias, [place] 02/26/19 2347    Isla Pence, MD 02/28/19 [phone]

## 2019-02-27 ENCOUNTER — Encounter: Payer: Self-pay | Admitting: *Deleted

## 2019-02-27 DIAGNOSIS — I5032 Chronic diastolic (congestive) heart failure: Secondary | ICD-10-CM

## 2019-02-27 DIAGNOSIS — F10230 Alcohol dependence with withdrawal, uncomplicated: Secondary | ICD-10-CM

## 2019-02-27 DIAGNOSIS — Z720 Tobacco use: Secondary | ICD-10-CM

## 2019-02-27 DIAGNOSIS — G4733 Obstructive sleep apnea (adult) (pediatric): Secondary | ICD-10-CM

## 2019-02-27 DIAGNOSIS — J9601 Acute respiratory failure with hypoxia: Secondary | ICD-10-CM

## 2019-02-27 DIAGNOSIS — J40 Bronchitis, not specified as acute or chronic: Secondary | ICD-10-CM

## 2019-02-27 DIAGNOSIS — J441 Chronic obstructive pulmonary disease with (acute) exacerbation: Secondary | ICD-10-CM

## 2019-02-27 LAB — CBC
HCT: 66.1 % — ABNORMAL HIGH (ref 36.0–46.0)
Hemoglobin: 18 g/dL — ABNORMAL HIGH (ref 12.0–15.0)
MCH: 22.6 pg — ABNORMAL LOW (ref 26.0–34.0)
MCHC: 27.2 g/dL — ABNORMAL LOW (ref 30.0–36.0)
MCV: 83 fL (ref 80.0–100.0)
Platelets: 262 10*3/uL (ref 150–400)
RBC: 7.96 MIL/uL — ABNORMAL HIGH (ref 3.87–5.11)
RDW: 23.3 % — ABNORMAL HIGH (ref 11.5–15.5)
WBC: 6 10*3/uL (ref 4.0–10.5)
nRBC: 0 % (ref 0.0–0.2)

## 2019-02-27 LAB — BLOOD GAS, ARTERIAL
Acid-Base Excess: 22 mmol/L — ABNORMAL HIGH (ref 0.0–2.0)
Acid-Base Excess: 21.6 mmol/L — ABNORMAL HIGH (ref 0.0–2.0)
Bicarbonate: 49.3 mmol/L — ABNORMAL HIGH (ref 20.0–28.0)
Bicarbonate: 48.6 mmol/L — ABNORMAL HIGH (ref 20.0–28.0)
Delivery systems: POSITIVE
Drawn by: 535471
Drawn by: 53547
Expiratory PAP: 6
FIO2: 0.6
Inspiratory PAP: 18
O2 Content: 10 L/min
O2 Saturation: 91.9 %
O2 Saturation: 93.8 %
Patient temperature: 98.6
Patient temperature: 98.6
RATE: 10 resp/min
pCO2 arterial: 100 mmHg (ref 32.0–48.0)
pCO2 arterial: 93.4 mmHg (ref 32.0–48.0)
pH, Arterial: 7.314 — ABNORMAL LOW (ref 7.350–7.450)
pH, Arterial: 7.337 — ABNORMAL LOW (ref 7.350–7.450)
pO2, Arterial: 70.7 mmHg — ABNORMAL LOW (ref 83.0–108.0)
pO2, Arterial: 76.4 mmHg — ABNORMAL LOW (ref 83.0–108.0)

## 2019-02-27 LAB — COMPREHENSIVE METABOLIC PANEL
ALT: 28 U/L (ref 0–44)
AST: 26 U/L (ref 15–41)
Albumin: 3.8 g/dL (ref 3.5–5.0)
Alkaline Phosphatase: 67 U/L (ref 38–126)
Anion gap: 13 (ref 5–15)
BUN: 9 mg/dL (ref 6–20)
CO2: 37 mmol/L — ABNORMAL HIGH (ref 22–32)
Calcium: 9.2 mg/dL (ref 8.9–10.3)
Chloride: 91 mmol/L — ABNORMAL LOW (ref 98–111)
Creatinine, Ser: 0.71 mg/dL (ref 0.44–1.00)
GFR calc Af Amer: >60 mL/min (ref >60)
GFR calc non Af Amer: >60 mL/min (ref >60)
Glucose, Bld: 109 mg/dL — ABNORMAL HIGH (ref 70–99)
Potassium: 4.2 mmol/L (ref 3.5–5.1)
Sodium: 141 mmol/L (ref 135–145)
Total Bilirubin: 1 mg/dL (ref 0.3–1.2)
Total Protein: 7.3 g/dL (ref 6.5–8.1)

## 2019-02-27 LAB — GLUCOSE, CAPILLARY: Glucose-Capillary: 129 mg/dL — ABNORMAL HIGH (ref 70–99)

## 2019-02-27 LAB — TROPONIN I (HIGH SENSITIVITY): Troponin I (High Sensitivity): 11 ng/L (ref <18)

## 2019-02-27 LAB — ETHANOL: Alcohol, Ethyl (B): <10 mg/dL (ref <10)

## 2019-02-27 LAB — HIV ANTIBODY (ROUTINE TESTING W REFLEX): HIV Screen 4th Generation wRfx: NONREACTIVE

## 2019-02-27 LAB — MRSA PCR SCREENING: MRSA by PCR: NEGATIVE

## 2019-02-27 MED ORDER — SODIUM CHLORIDE 0.9 % IV SOLN
500.0000 mg | INTRAVENOUS | Status: DC
  Administered 2019-02-27: 02:00:00 500 mg via INTRAVENOUS
  Filled 2019-02-27: qty 500

## 2019-02-27 MED ORDER — LORAZEPAM 1 MG PO TABS: 1.0000 mg | ORAL_TABLET | Freq: Four times a day (QID) | ORAL | Status: DC | PRN

## 2019-02-27 MED ORDER — METHYLPREDNISOLONE SODIUM SUCC 125 MG IJ SOLR
60.0000 mg | Freq: Once | INTRAMUSCULAR | Status: AC
  Administered 2019-02-27: 16:00:00 60 mg via INTRAVENOUS
  Filled 2019-02-27: qty 2

## 2019-02-27 MED ORDER — LORAZEPAM 2 MG/ML IJ SOLN
INTRAMUSCULAR | Status: AC
  Filled 2019-02-27: qty 1

## 2019-02-27 MED ORDER — DIPHENHYDRAMINE HCL 50 MG/ML IJ SOLN
INTRAMUSCULAR | Status: AC
  Administered 2019-02-27: 16:00:00 50 mg
  Filled 2019-02-27: qty 1

## 2019-02-27 MED ORDER — METHYLPREDNISOLONE SODIUM SUCC 125 MG IJ SOLR
80.0000 mg | Freq: Three times a day (TID) | INTRAMUSCULAR | Status: DC
  Administered 2019-02-27 – 2019-02-28 (×4): 80 mg via INTRAVENOUS
  Filled 2019-02-27 (×4): qty 2

## 2019-02-27 MED ORDER — NICOTINE POLACRILEX 2 MG MT GUM: 2.0000 mg | CHEWING_GUM | OROMUCOSAL | Status: DC | PRN

## 2019-02-27 MED ORDER — BUPROPION HCL ER (SR) 150 MG PO TB12
150.0000 mg | ORAL_TABLET | Freq: Every day | ORAL | Status: DC
  Administered 2019-02-28 – 2019-03-01 (×2): 150 mg via ORAL
  Filled 2019-02-27 (×3): qty 1

## 2019-02-27 MED ORDER — QUETIAPINE FUMARATE 100 MG PO TABS
100.0000 mg | ORAL_TABLET | Freq: Every morning | ORAL | Status: DC
  Filled 2019-02-27: qty 1

## 2019-02-27 MED ORDER — THIAMINE HCL 100 MG/ML IJ SOLN
100.0000 mg | Freq: Every day | INTRAMUSCULAR | Status: DC
  Administered 2019-02-27: 100 mg via INTRAVENOUS
  Filled 2019-02-27: qty 2

## 2019-02-27 MED ORDER — ALBUTEROL SULFATE (2.5 MG/3ML) 0.083% IN NEBU
2.5000 mg | INHALATION_SOLUTION | Freq: Four times a day (QID) | RESPIRATORY_TRACT | Status: DC
  Administered 2019-02-27 (×2): 2.5 mg via RESPIRATORY_TRACT
  Filled 2019-02-27 (×2): qty 3

## 2019-02-27 MED ORDER — LOSARTAN POTASSIUM 50 MG PO TABS
50.0000 mg | ORAL_TABLET | Freq: Every day | ORAL | Status: DC
  Administered 2019-02-27 – 2019-03-01 (×3): 50 mg via ORAL
  Filled 2019-02-27 (×4): qty 1

## 2019-02-27 MED ORDER — DIPHENHYDRAMINE HCL 50 MG/ML IJ SOLN: 50.0000 mg | Freq: Four times a day (QID) | INTRAMUSCULAR | Status: DC | PRN

## 2019-02-27 MED ORDER — IPRATROPIUM-ALBUTEROL 0.5-2.5 (3) MG/3ML IN SOLN
3.0000 mL | Freq: Four times a day (QID) | RESPIRATORY_TRACT | Status: DC
  Administered 2019-02-27 – 2019-02-28 (×4): 3 mL via RESPIRATORY_TRACT
  Filled 2019-02-27 (×4): qty 3

## 2019-02-27 MED ORDER — MOMETASONE FURO-FORMOTEROL FUM 200-5 MCG/ACT IN AERO
2.0000 | INHALATION_SPRAY | Freq: Two times a day (BID) | RESPIRATORY_TRACT | Status: DC
  Administered 2019-02-27 – 2019-03-01 (×5): 2 via RESPIRATORY_TRACT
  Filled 2019-02-27: qty 8.8

## 2019-02-27 MED ORDER — GABAPENTIN 300 MG PO CAPS
300.0000 mg | ORAL_CAPSULE | Freq: Three times a day (TID) | ORAL | Status: DC
  Administered 2019-02-27 – 2019-03-01 (×6): 300 mg via ORAL
  Filled 2019-02-27 (×7): qty 1

## 2019-02-27 MED ORDER — NICOTINE 7 MG/24HR TD PT24: 7.0000 mg | MEDICATED_PATCH | Freq: Every day | TRANSDERMAL | Status: DC | PRN

## 2019-02-27 MED ORDER — VITAMIN B-1 100 MG PO TABS
100.0000 mg | ORAL_TABLET | Freq: Every day | ORAL | Status: DC
  Administered 2019-02-28 – 2019-03-01 (×2): 100 mg via ORAL
  Filled 2019-02-27 (×2): qty 1

## 2019-02-27 MED ORDER — HYDRALAZINE HCL 20 MG/ML IJ SOLN
10.0000 mg | Freq: Four times a day (QID) | INTRAMUSCULAR | Status: DC | PRN
  Administered 2019-02-27: 15:00:00 10 mg via INTRAVENOUS
  Filled 2019-02-27: qty 1

## 2019-02-27 MED ORDER — QUETIAPINE FUMARATE 100 MG PO TABS
200.0000 mg | ORAL_TABLET | Freq: Every day | ORAL | Status: DC
  Administered 2019-02-27: 200 mg via ORAL
  Filled 2019-02-27: qty 2

## 2019-02-27 MED ORDER — FUROSEMIDE 10 MG/ML IJ SOLN
20.0000 mg | Freq: Every day | INTRAMUSCULAR | Status: DC
  Administered 2019-02-27: 20 mg via INTRAVENOUS
  Filled 2019-02-27: qty 2

## 2019-02-27 MED ORDER — EPINEPHRINE 0.3 MG/0.3ML IJ SOAJ
0.3000 mg | Freq: Once | INTRAMUSCULAR | Status: DC | PRN
  Filled 2019-02-27: qty 0.6

## 2019-02-27 MED ORDER — ADULT MULTIVITAMIN W/MINERALS CH
1.0000 | ORAL_TABLET | Freq: Every day | ORAL | Status: DC
  Administered 2019-02-28 – 2019-03-01 (×2): 1 via ORAL
  Filled 2019-02-27 (×2): qty 1

## 2019-02-27 MED ORDER — NICOTINE 14 MG/24HR TD PT24
14.0000 mg | MEDICATED_PATCH | Freq: Every day | TRANSDERMAL | Status: DC
  Administered 2019-02-28 – 2019-03-01 (×2): 14 mg via TRANSDERMAL
  Filled 2019-02-27 (×2): qty 1

## 2019-02-27 MED ORDER — CHLORHEXIDINE GLUCONATE CLOTH 2 % EX PADS
6.0000 | MEDICATED_PAD | Freq: Every day | CUTANEOUS | Status: DC
  Administered 2019-02-27: 6 via TOPICAL

## 2019-02-27 MED ORDER — ARIPIPRAZOLE 10 MG PO TABS
5.0000 mg | ORAL_TABLET | Freq: Every day | ORAL | Status: DC
  Administered 2019-02-28 – 2019-03-01 (×2): 5 mg via ORAL
  Filled 2019-02-27 (×3): qty 1

## 2019-02-27 MED ORDER — ORAL CARE MOUTH RINSE: 15.0000 mL | Freq: Two times a day (BID) | OROMUCOSAL | Status: DC

## 2019-02-27 MED ORDER — UMECLIDINIUM BROMIDE 62.5 MCG/INH IN AEPB
1.0000 | INHALATION_SPRAY | Freq: Every day | RESPIRATORY_TRACT | Status: DC
  Administered 2019-02-27: 1 via RESPIRATORY_TRACT
  Filled 2019-02-27: qty 7

## 2019-02-27 MED ORDER — ALBUTEROL SULFATE (2.5 MG/3ML) 0.083% IN NEBU: 2.5000 mg | INHALATION_SOLUTION | Freq: Four times a day (QID) | RESPIRATORY_TRACT | Status: DC | PRN

## 2019-02-27 MED ORDER — SODIUM CHLORIDE 0.9 % IV SOLN
3.0000 g | Freq: Four times a day (QID) | INTRAVENOUS | Status: DC
  Administered 2019-02-27: 15:00:00 3 g via INTRAVENOUS
  Filled 2019-02-27 (×2): qty 8
  Filled 2019-02-27: qty 3
  Filled 2019-02-27: qty 8

## 2019-02-27 MED ORDER — FAMOTIDINE IN NACL 20-0.9 MG/50ML-% IV SOLN
20.0000 mg | Freq: Two times a day (BID) | INTRAVENOUS | Status: DC
  Administered 2019-02-27: 22:00:00 20 mg via INTRAVENOUS
  Filled 2019-02-27 (×2): qty 50

## 2019-02-27 MED ORDER — FOLIC ACID 1 MG PO TABS
1.0000 mg | ORAL_TABLET | Freq: Every day | ORAL | Status: DC
  Administered 2019-02-28 – 2019-03-01 (×2): 1 mg via ORAL
  Filled 2019-02-27 (×2): qty 1

## 2019-02-27 MED ORDER — ORAL CARE MOUTH RINSE
15.0000 mL | Freq: Two times a day (BID) | OROMUCOSAL | Status: DC
  Administered 2019-02-28 – 2019-03-01 (×3): 15 mL via OROMUCOSAL

## 2019-02-27 MED ORDER — TRAZODONE HCL 50 MG PO TABS
100.0000 mg | ORAL_TABLET | Freq: Every day | ORAL | Status: DC
  Administered 2019-02-27: 03:00:00 100 mg via ORAL
  Filled 2019-02-27: qty 2

## 2019-02-27 MED ORDER — LORAZEPAM 2 MG/ML IJ SOLN
1.0000 mg | Freq: Four times a day (QID) | INTRAMUSCULAR | Status: DC | PRN
  Administered 2019-02-27: 1 mg via INTRAVENOUS

## 2019-02-27 NOTE — Progress Notes (Signed)
Initiated unison - pt face started swelling/ turning red. Eyes blood shot, L eye more swollen that right. Expiratory whezes (not new) repeat ABG done w/o much change from prior. Benadryl 20m given per IV. Unison stopped. Flushed IV.

## 2019-02-27 NOTE — Progress Notes (Signed)
Went to complete Scientist, physiological.  Nurse indicated that patient was not in any shape to complete AD today.  I will follow-up tomorrow during rounds to see if patient has the capacity to complete AD paperwork.

## 2019-02-27 NOTE — Progress Notes (Addendum)
PROGRESS NOTE    Diane Gilbert  GJF:[id number]  DOB: August 29, 1965  DOA: 02/26/2019 PCP: Nolene Ebbs, MD  Brief Narrative:  53 y.o. female, w schizophrenia, anxiety/ depression, , h/o Etoh abuse, hypertension, Copd on home O2 2L [place], CHF (diastolic), OSA apparently ran out of o2 at home.  Pt had no oxygen for about 6 hours,  When EMS arrived her O2 sat was 42% and then came up to 94% with 6L [place].  Pt notes increase in dyspnea over the past few days w slight cough w white sputum.Patient does have mild pulmonary hypertension on R/L heart cath 01/04/2018 -> negative CAD ED course: Na 140, K 4.5, Bun 9, Creatinine 0.65.Wbc 9.1, Hgb 17.3, Plt 238.COVID -19 negative. HSTrop _0 . EKG sinus tach,CXR Increased interstitial opacities may be due to mild edema versus atypical infection.Pt admitted for acute hypoxic respiratory failure secondary to Copd/CHF exacerbation with IV steroids, antibiotics (azithromycin) and IV lasix .   Subjective:  Patient seen in rounds, no acute distress noted.On 10 lits high flow.  She is somnolent. Per nurse, she woke up and ate breakfast earlier and appeared shaky. She drinks beer 80oz-100oz/day , last drink ?Saturday. Alcohol level/ABG not sent on admission.   Objective: Vitals:   02/27/19 0100 02/27/19 0228 02/27/19 0305 02/27/19 0605  BP: (!) 181/94 (!) 169/113  (!) 152/80  Pulse: 89 93 (!) 103 (!) 107  Resp: (!) 23 (!) _1 Temp:  98.2 F (36.8 C)  98 F (36.7 C)  TempSrc:  Oral  Oral  SpO2: 93%  92% 95%  Weight:  99.3 kg    Height:  _2  (1.626 m)      Intake/Output Summary (Last 24 hours) at 02/27/2019 9791 Last data filed at 02/27/2019 0315 Gross per 24 hour  Intake 490 ml  Output 1150 ml  Net -660 ml   Filed Weights   02/26/19 1932 02/27/19 0228  Weight: 100.7 kg 99.3 kg    Physical Examination:  General exam: Appears somnolent, no acute distress Respiratory system: scattered wheezing bilaterally.  Respiratory effort normal.  Cardiovascular system: S1 & S2 heard, tachycardic.No JVD, murmurs, rubs, gallops or clicks. No pedal edema. Gastrointestinal system: Abdomen is nondistended, soft and nontender. No organomegaly or masses felt. Normal bowel sounds heard. Central nervous system: Somnolent easily arousable , oriented x2, mild intentional tremors Extremities:  Moving all extremities. Good hand grip  Skin: No rashes, lesions or ulcers Psychiatry: Judgement and insight appear normal. Mood & affect appropriate.   Data Reviewed: I have personally reviewed following labs and imaging studies  CBC: Recent Labs  Lab 02/26/19 2110 02/27/19 0237  WBC 9.1 6.0  NEUTROABS 4.2  --   HGB 17.3* 18.0*  HCT 65.1* 66.1*  MCV 84.4 83.0  PLT 238 504   Basic Metabolic Panel: Recent Labs  Lab 02/26/19 2110 02/27/19 0237  NA 140 141  K 4.5 4.2  CL 93* 91*  CO2 35* 37*  GLUCOSE 102* 109*  BUN 9 9  CREATININE 0.65 0.71  CALCIUM 9.0 9.2   GFR: Estimated Creatinine Clearance: 96.3 mL/min (by C-G formula based on SCr of 0.71 mg/dL). Liver Function Tests: Recent Labs  Lab 02/27/19 0237  AST 26  ALT 28  ALKPHOS 67  BILITOT 1.0  PROT 7.3  ALBUMIN 3.8   No results for input(s): LIPASE, AMYLASE in the last 168 hours. No results for input(s): AMMONIA in the last 168 hours. Coagulation Profile: No results for input(s): INR, PROTIME in the  last 168 hours. Cardiac Enzymes: No results for input(s): CKTOTAL, CKMB, CKMBINDEX, TROPONINI in the last 168 hours. BNP (last 3 results) No results for input(s): PROBNP in the last 8760 hours. HbA1C: No results for input(s): HGBA1C in the last 72 hours. CBG: No results for input(s): GLUCAP in the last 168 hours. Lipid Profile: No results for input(s): CHOL, HDL, LDLCALC, TRIG, CHOLHDL, LDLDIRECT in the last 72 hours. Thyroid Function Tests: No results for input(s): TSH, T4TOTAL, FREET4, T3FREE, THYROIDAB in the last 72 hours. Anemia Panel: No results for input(s):  VITAMINB12, FOLATE, FERRITIN, TIBC, IRON, RETICCTPCT in the last 72 hours. Sepsis Labs: No results for input(s): PROCALCITON, LATICACIDVEN in the last 168 hours.  Recent Results (from the past 240 hour(s))  SARS Coronavirus 2 [facility] order, Performed in [facility] hospital lab) Nasopharyngeal Nasopharyngeal Swab     Status: None   Collection Time: 02/26/19  9:32 PM   Specimen: Nasopharyngeal Swab  Result Value Ref Range Status   SARS Coronavirus 2 NEGATIVE NEGATIVE Final    Comment: (NOTE) If result is NEGATIVE SARS-CoV-2 target nucleic acids are NOT DETECTED. The SARS-CoV-2 RNA is generally detectable in upper and lower  respiratory specimens during the acute phase of infection. The lowest  concentration of SARS-CoV-2 viral copies this assay can detect is 250  copies / mL. A negative result does not preclude SARS-CoV-2 infection  and should not be used as the sole basis for treatment or other  patient management decisions.  A negative result may occur with  improper specimen collection / handling, submission of specimen other  than nasopharyngeal swab, presence of viral mutation(s) within the  areas targeted by this assay, and inadequate number of viral copies  (<250 copies / mL). A negative result must be combined with clinical  observations, patient history, and epidemiological information. If result is POSITIVE SARS-CoV-2 target nucleic acids are DETECTED. The SARS-CoV-2 RNA is generally detectable in upper and lower  respiratory specimens dur ing the acute phase of infection.  Positive  results are indicative of active infection with SARS-CoV-2.  Clinical  correlation with patient history and other diagnostic information is  necessary to determine patient infection status.  Positive results do  not rule out bacterial infection or co-infection with other viruses. If result is PRESUMPTIVE POSTIVE SARS-CoV-2 nucleic acids MAY BE PRESENT.   A presumptive positive result was  obtained on the submitted specimen  and confirmed on repeat testing.  While 2019 novel coronavirus  (SARS-CoV-2) nucleic acids may be present in the submitted sample  additional confirmatory testing may be necessary for epidemiological  and / or clinical management purposes  to differentiate between  SARS-CoV-2 and other Sarbecovirus currently known to infect humans.  If clinically indicated additional testing with an alternate test  methodology [phone]) is advised. The SARS-CoV-2 RNA is generally  detectable in upper and lower respiratory sp ecimens during the acute  phase of infection. The expected result is Negative. Fact Sheet for Patients:  StrictlyIdeas.no Fact Sheet for Healthcare Providers: BankingDealers.co.za This test is not yet approved or cleared by the [place] FDA and has been authorized for detection and/or diagnosis of SARS-CoV-2 by FDA under an Emergency Use Authorization (EUA).  This EUA will remain in effect (meaning this test can be used) for the duration of the COVID-19 declaration under Section 564(b)(1) of the Act, 21 U.S.C. section 360bbb-3(b)(1), unless the authorization is terminated or revoked sooner. Performed at [facility], [place] [address], [place], [place] [address]  MRSA PCR Screening     Status: None   Collection Time: 02/27/19  1:50 AM   Specimen: Nasopharyngeal  Result Value Ref Range Status   MRSA by PCR NEGATIVE NEGATIVE Final    Comment:        The GeneXpert MRSA Assay (FDA approved for NASAL specimens only), is one component of a comprehensive MRSA colonization surveillance program. It is not intended to diagnose MRSA infection nor to guide or monitor treatment for MRSA infections. Performed at [facility], [place] [address], [place], [place] [address]       Radiology Studies: Dg Chest [facility] 1 View  Result Date: 02/26/2019 CLINICAL DATA:  Shortness of breath. EXAM:  PORTABLE CHEST 1 VIEW COMPARISON:  August 02, 2018 FINDINGS: Stable cardiomegaly. The hila and mediastinum are unchanged. No pneumothorax. Increased interstitial opacities are identified. IMPRESSION: Increased interstitial opacities may be due to mild edema versus atypical infection. Electronically Signed   By: Dorise Bullion III M.D   On: 02/26/2019 20:16        Scheduled Meds: . albuterol  2.5 mg Nebulization Q6H  . ARIPiprazole  5 mg Oral Daily  . buPROPion  150 mg Oral Daily  . enoxaparin (LOVENOX) injection  40 mg Subcutaneous Daily  . furosemide  20 mg Intravenous Daily  . gabapentin  300 mg Oral TID  . losartan  50 mg Oral Daily  . mouth rinse  15 mL Mouth Rinse BID  . methylPREDNISolone (SOLU-MEDROL) injection  80 mg Intravenous Q8H  . mometasone-formoterol  2 puff Inhalation BID  . QUEtiapine  100 mg Oral q morning - 10a  . QUEtiapine  200 mg Oral QHS  . sodium chloride flush  3 mL Intravenous Q12H  . traZODone  100 mg Oral QHS  . umeclidinium bromide  1 puff Inhalation Daily   Continuous Infusions: . sodium chloride    . azithromycin Stopped (02/27/19 0315)    Assessment & Plan:    1. Acute on chronic hypoxic respiratory failure : Presumed secondary to COPD exacerbation with or without underlying pneumonia.Continue IV lasix/steroids/nebs/abx. Add scheduled nebs. Admits to ongoing smoking 2 PPD and non compliance with home O2. CXR findings could represent aspiration pneumonitis rather than CHF as BNP only at 240. Change abx to Unasyn. Repeat CXR in am.   2. Hypersomnolence: secondary to CO2 narcosis vs alcohol intoxication. No alcohol level done on admission. Will send now. Last drink 48 hours back. CIWA protocol, ABG for CO2 level.   3. COPD /Pulmonary HTN: Plan as discussed above. Nicotine patch  4. Acute on chronic diastolic CHF: BNP 498.No leg edema. Last echo with preserve EF in 02/19. Patient does have mild pulmonary hypertension on R/L heart cath 01/04/2018 ->  negative CAD. Continue low dose IV diuretics for now.   5. Alcohol abuse : Check alcohol level,  Hypertensive /tachycardic and mildly tremulous. Could be early withdrawal. CIWA protocol, MVI/thiamine.  6. HTN: continue losartan, hydralazine prn  7. Schizophrenia, anxiety/ depression : resume home meds  DVT prophylaxis: Lovenox Code Status: Full code Family / Patient Communication: no family bedside Disposition Plan: Home when medically cleared  Addendum : ABG just resulted showing resp acidosis, CO2 100, Ph 7.31: will start on BIPAP   LOS: 1 day    Time spent: 35 minutes  Addendum 3.30pm: Called by nurse that patient developed facial/periorbital swelling and was beginning to break out into a skin rash along her neck about 15 minutes into a IV Unasyn infusion.  Patient  reports no prior allergic reactions to penicillin.  States she had taken amoxicillin in the past.  The only other new medication she received was IV hydralazine.  Stat orders for IV Benadryl/additional Solu-Medrol given.  Her repeat ABG still shows PCO2 of 90.  EpiPen was ordered but did not needed.  Patient awake and communicating.  No tongue swelling or respiratory distress.  Requested PCCM evaluation given significant hypercarbia and need for close monitoring with angioedema.  They have kindly accepted patient for closer monitoring in ICU overnight.  Allergy entered for Unasyn.  Will request pharmacy for review of prior records and advise if patient received and tolerated hydralazine in the past.   Guilford Shi, MD [facility] Pager [phone]  If 7PM-7AM, please contact night-coverage [email or web address] Password [email or web address] 02/27/2019, 7:22 AM

## 2019-02-27 NOTE — ED Notes (Signed)
ED TO INPATIENT HANDOFF REPORT  ED Nurse Name and Phone #: Claiborne Billings [phone]  S Name/Age/Gender Diane Gilbert 53 y.o. female Room/Bed: 018C/018C  Code Status   Code Status: Full Code  Home/SNF/Other Home Patient oriented to: self, place, time and situation Is this baseline? Yes   Triage Complete: Triage complete  Chief Complaint Shortness of Breathe  Triage Note EMS called for shortness of breath. Ran out of home O2. Had no oxygen for about 6 hours. EMS reports initial sats were 42%. Sats came up to 94% on 6L per nasal cannula. C/O swelling and pain in right leg    Allergies Allergies  Allergen Reactions  . Contrast Media [Iodinated Diagnostic Agents] Nausea And Vomiting    Level of Care/Admitting Diagnosis ED Disposition    ED Disposition Condition Comment   Admit  Hospital Area: [facility] [[id number]]  Level of Care: Progressive [102]  Covid Evaluation: Confirmed COVID Negative  Diagnosis: Acute respiratory failure with hypoxia [facility]) [[id number]]  Admitting Physician: Jani Gravel [[id number]]  Attending Physician: Jani Gravel [phone]  Estimated length of stay: 3 - 4 days  Certification:: I certify this patient will need inpatient services for at least 2 midnights  PT Class (Do Not Modify): Inpatient [101]  PT Acc Code (Do Not Modify): Private [1]       B Medical/Surgery History Past Medical History:  Diagnosis Date  . Acute on chronic respiratory failure with hypoxia ([place]) 06/07/2014  . Acute respiratory failure with hypoxia ([place]) 03/23/2017  . Alcohol abuse   . Anxiety   . Arthritis   . CHF (congestive heart failure) ([place])   . Community acquired pneumonia   . Depression   . Headache(784.0)   . Hypertension   . Insomnia   . Interstitial lung disease ([place])   . Oxygen dependent   . Respiratory failure, acute and chronic ([place]) 05/09/2017  . Schizophrenia ([place])   . Shortness of breath dyspnea   . Sleep apnea   . Sleep apnea    Past Surgical  History:  Procedure Laterality Date  . BACK SURGERY    . LUNG BIOPSY Left 07/16/2014   Procedure: LUNG BIOPSY;  Surgeon: Grace Isaac, MD;  Location: [facility];  Service: Thoracic;  Laterality: Left;  Marland Kitchen MULTIPLE TOOTH EXTRACTIONS    . RIGHT/LEFT HEART CATH AND CORONARY ANGIOGRAPHY N/A 01/04/2018   Procedure: RIGHT/LEFT HEART CATH AND CORONARY ANGIOGRAPHY;  Surgeon: Adrian Prows, MD;  Location: [facility];  Service: Cardiovascular;  Laterality: N/A;  . VIDEO ASSISTED THORACOSCOPY Left 07/16/2014   Procedure: VIDEO ASSISTED THORACOSCOPY;  Surgeon: Grace Isaac, MD;  Location: [facility];  Service: Thoracic;  Laterality: Left;  Marland Kitchen VIDEO BRONCHOSCOPY Bilateral 04/11/2014   Procedure: VIDEO BRONCHOSCOPY WITH FLUORO;  Surgeon: Kathee Delton, MD;  Location: [facility];  Service: Cardiopulmonary;  Laterality: Bilateral;  . VIDEO BRONCHOSCOPY N/A 07/16/2014   Procedure: VIDEO BRONCHOSCOPY;  Surgeon: Grace Isaac, MD;  Location[facility];  Service: Thoracic;  Laterality: N/A;  . WEDGE RESECTION Left 07/16/2014   Procedure: WEDGE RESECTION;  Surgeon: Grace Isaac, MD;  Location: [facility];  Service: Thoracic;  Laterality: Left;  left upper lobe lung resection     A IV Location/Drains/Wounds Patient Lines/Drains/Airways Status   Active Line/Drains/Airways    None          Intake/Output Last 24 hours No intake or output data in the 24 hours ending 02/27/19 0104  Labs/Imaging Results for orders placed or performed  during the hospital encounter of 02/26/19 (from the past 48 hour(s))  Rapid urine drug screen (hospital performed)     Status: None   Collection Time: 02/26/19  7:55 PM  Result Value Ref Range   Opiates NONE DETECTED NONE DETECTED   Cocaine NONE DETECTED NONE DETECTED   Benzodiazepines NONE DETECTED NONE DETECTED   Amphetamines NONE DETECTED NONE DETECTED   Tetrahydrocannabinol NONE DETECTED NONE DETECTED   Barbiturates NONE DETECTED NONE DETECTED    Comment: (NOTE) DRUG  SCREEN FOR MEDICAL PURPOSES ONLY.  IF CONFIRMATION IS NEEDED FOR ANY PURPOSE, NOTIFY LAB WITHIN 5 DAYS. LOWEST DETECTABLE LIMITS FOR URINE DRUG SCREEN Drug Class                     Cutoff (ng/mL) Amphetamine and metabolites    1000 Barbiturate and metabolites    200 Benzodiazepine                 358 Tricyclics and metabolites     300 Opiates and metabolites        300 Cocaine and metabolites        300 THC                            50 Performed at [facility], [place] [address], [place], [place] [address]   Brain natriuretic peptide     Status: Abnormal   Collection Time: 02/26/19  9:10 PM  Result Value Ref Range   B Natriuretic Peptide 246.7 (H) 0.0 - 100.0 pg/mL    Comment: Performed at [facility] [address] [place], [place] [address]  Basic metabolic panel     Status: Abnormal   Collection Time: 02/26/19  9:10 PM  Result Value Ref Range   Sodium 140 135 - 145 mmol/L   Potassium 4.5 3.5 - 5.1 mmol/L   Chloride 93 (L) 98 - 111 mmol/L   CO2 35 (H) 22 - 32 mmol/L   Glucose, Bld 102 (H) 70 - 99 mg/dL   BUN 9 6 - 20 mg/dL   Creatinine, Ser 0.65 0.44 - 1.00 mg/dL   Calcium 9.0 8.9 - 10.3 mg/dL   GFR calc non Af Amer >60 >60 mL/min   GFR calc Af Amer >60 >60 mL/min   Anion gap 12 5 - 15    Comment: Performed at [facility], [place] [address], [place], [place] [address]  Troponin I (High Sensitivity)     Status: None   Collection Time: 02/26/19  9:10 PM  Result Value Ref Range   Troponin I (High Sensitivity) 12 <18 ng/L    Comment: (NOTE) Elevated high sensitivity troponin I (hsTnI) values and significant  changes across serial measurements may suggest ACS but many other  chronic and acute conditions are known to elevate hsTnI results.  Refer to the "Links" section for chest pain algorithms and additional  guidance. Performed at [facility], [place] [address] [place], [place] [address]   CBC with Differential     Status: Abnormal   Collection  Time: 02/26/19  9:10 PM  Result Value Ref Range   WBC 9.1 4.0 - 10.5 K/uL   RBC 7.71 (H) 3.87 - 5.11 MIL/uL   Hemoglobin 17.3 (H) 12.0 - 15.0 g/dL   HCT 65.1 (H) 36.0 - 46.0 %   MCV 84.4 80.0 - 100.0 fL   MCH 22.4 (L) 26.0 - 34.0 pg  MCHC 26.6 (L) 30.0 - 36.0 g/dL   RDW 22.7 (H) 11.5 - 15.5 %   Platelets 238 150 - 400 K/uL   nRBC 0.0 0.0 - 0.2 %   Neutrophils Relative % 46 %   Neutro Abs 4.2 1.7 - 7.7 K/uL   Lymphocytes Relative 45 %   Lymphs Abs 4.1 (H) 0.7 - 4.0 K/uL   Monocytes Relative 7 %   Monocytes Absolute 0.7 0.1 - 1.0 K/uL   Eosinophils Relative 1 %   Eosinophils Absolute 0.1 0.0 - 0.5 K/uL   Basophils Relative 1 %   Basophils Absolute 0.1 0.0 - 0.1 K/uL   RBC Morphology MORPHOLOGY UNREMARKABLE    Immature Granulocytes 0 %   Abs Immature Granulocytes 0.02 0.00 - 0.07 K/uL    Comment: Performed at [facility] [address], [place], [place] [address]  SARS Coronavirus 2 [facility] order, Performed in [facility] hospital lab) Nasopharyngeal Nasopharyngeal Swab     Status: None   Collection Time: 02/26/19  9:32 PM   Specimen: Nasopharyngeal Swab  Result Value Ref Range   SARS Coronavirus 2 NEGATIVE NEGATIVE    Comment: (NOTE) If result is NEGATIVE SARS-CoV-2 target nucleic acids are NOT DETECTED. The SARS-CoV-2 RNA is generally detectable in upper and lower  respiratory specimens during the acute phase of infection. The lowest  concentration of SARS-CoV-2 viral copies this assay can detect is 250  copies / mL. A negative result does not preclude SARS-CoV-2 infection  and should not be used as the sole basis for treatment or other  patient management decisions.  A negative result may occur with  improper specimen collection / handling, submission of specimen other  than nasopharyngeal swab, presence of viral mutation(s) within the  areas targeted by this assay, and inadequate number of viral copies  (<250 copies / mL). A negative result must be combined with  clinical  observations, patient history, and epidemiological information. If result is POSITIVE SARS-CoV-2 target nucleic acids are DETECTED. The SARS-CoV-2 RNA is generally detectable in upper and lower  respiratory specimens dur ing the acute phase of infection.  Positive  results are indicative of active infection with SARS-CoV-2.  Clinical  correlation with patient history and other diagnostic information is  necessary to determine patient infection status.  Positive results do  not rule out bacterial infection or co-infection with other viruses. If result is PRESUMPTIVE POSTIVE SARS-CoV-2 nucleic acids MAY BE PRESENT.   A presumptive positive result was obtained on the submitted specimen  and confirmed on repeat testing.  While 2019 novel coronavirus  (SARS-CoV-2) nucleic acids may be present in the submitted sample  additional confirmatory testing may be necessary for epidemiological  and / or clinical management purposes  to differentiate between  SARS-CoV-2 and other Sarbecovirus currently known to infect humans.  If clinically indicated additional testing with an alternate test  methodology [phone]) is advised. The SARS-CoV-2 RNA is generally  detectable in upper and lower respiratory sp ecimens during the acute  phase of infection. The expected result is Negative. Fact Sheet for Patients:  StrictlyIdeas.no Fact Sheet for Healthcare Providers: BankingDealers.co.za This test is not yet approved or cleared by the [place] FDA and has been authorized for detection and/or diagnosis of SARS-CoV-2 by FDA under an Emergency Use Authorization (EUA).  This EUA will remain in effect (meaning this test can be used) for the duration of the COVID-19 declaration under Section 564(b)(1) of the Act, 21 U.S.C. section 360bbb-3(b)(1), unless the  authorization is terminated or revoked sooner. Performed at [facility], [place]  [address] [place], [place] [address]   Troponin I (High Sensitivity)     Status: None   Collection Time: 02/26/19 11:45 PM  Result Value Ref Range   Troponin I (High Sensitivity) 11 <18 ng/L    Comment: (NOTE) Elevated high sensitivity troponin I (hsTnI) values and significant  changes across serial measurements may suggest ACS but many other  chronic and acute conditions are known to elevate hsTnI results.  Refer to the "Links" section for chest pain algorithms and additional  guidance. Performed at [facility], [place] [address] [place], [place] [address]    Dg Chest Port 1 View  Result Date: 02/26/2019 CLINICAL DATA:  Shortness of breath. EXAM: PORTABLE CHEST 1 VIEW COMPARISON:  August 02, 2018 FINDINGS: Stable cardiomegaly. The hila and mediastinum are unchanged. No pneumothorax. Increased interstitial opacities are identified. IMPRESSION: Increased interstitial opacities may be due to mild edema versus atypical infection. Electronically Signed   By: Dorise Bullion III M.D   On: 02/26/2019 20:16    Pending Labs Unresulted Labs (From admission, onward)    Start     Ordered   03/05/19 0500  Creatinine, serum  (enoxaparin (LOVENOX)    CrCl >/= 30 ml/min)  Weekly,   R    Comments: while on enoxaparin therapy    02/26/19 2351   02/27/19 0500  HIV antibody (Routine Testing)  Tomorrow morning,   R     02/26/19 2351   02/27/19 0500  Comprehensive metabolic panel  Tomorrow morning,   R     02/26/19 2351   02/27/19 0500  CBC  Tomorrow morning,   R     02/26/19 2351          Vitals/Pain Today's Vitals   02/26/19 2300 02/26/19 2345 02/27/19 0015 02/27/19 0034  BP: (!) 168/92 (!) 165/87 (!) 172/99 (!) 184/100  Pulse:   86 91  Resp: (!) 24   (!) 26  Temp:    98.9 F (37.2 C)  TempSrc:    Oral  SpO2:   90% 93%  Weight:      Height:      PainSc:    0-No pain    Isolation Precautions No active isolations  Medications Medications  enoxaparin (LOVENOX) injection 40 mg  (has no administration in time range)  sodium chloride flush (NS) 0.9 % injection 3 mL (has no administration in time range)  sodium chloride flush (NS) 0.9 % injection 3 mL (has no administration in time range)  0.9 %  sodium chloride infusion (has no administration in time range)  acetaminophen (TYLENOL) tablet 650 mg (has no administration in time range)    Or  acetaminophen (TYLENOL) suppository 650 mg (has no administration in time range)  umeclidinium bromide (INCRUSE ELLIPTA) 62.5 MCG/INH 1 puff (has no administration in time range)  albuterol (PROVENTIL) (2.5 MG/3ML) 0.083% nebulizer solution 2.5 mg (has no administration in time range)  methylPREDNISolone sodium succinate (SOLU-MEDROL) 125 mg/2 mL injection 80 mg (has no administration in time range)  azithromycin (ZITHROMAX) 500 mg in sodium chloride 0.9 % 250 mL IVPB (has no administration in time range)  furosemide (LASIX) injection 20 mg (has no administration in time range)  losartan (COZAAR) tablet 50 mg (has no administration in time range)  hydrALAZINE (APRESOLINE) injection 10 mg (has no administration in time range)  albuterol (VENTOLIN HFA) 108 (90 Base) MCG/ACT inhaler 8 puff (8 puffs  Inhalation Given 02/26/19 2008)  Ipratropium-Albuterol (COMBIVENT) respimat 1 puff (1 puff Inhalation Given 02/26/19 2041)  methylPREDNISolone sodium succinate (SOLU-MEDROL) 125 mg/2 mL injection 125 mg (125 mg Intravenous Given 02/26/19 2339)  furosemide (LASIX) injection 20 mg (20 mg Intravenous Given 02/26/19 2339)    Mobility walks Moderate fall risk   Focused Assessments Pulmonary Assessment Handoff:  Lung sounds: Bilateral Breath Sounds: Rhonchi L Breath Sounds: Rhonchi R Breath Sounds: Rhonchi O2 Device: Nasal Cannula O2 Flow Rate (L/min): 6 L/min      R Recommendations: See Admitting Provider Note  Report given to:   Additional Notes:

## 2019-02-27 NOTE — Consult Note (Addendum)
NAME:  Diane Gilbert, MRN:  [id number], DOB:  [birth year], LOS: 1 ADMISSION DATE:  02/26/2019, CONSULTATION DATE:  02/27/2019 REFERRING MD:  Earnest Conroy- [facility], CHIEF COMPLAINT:  Facial Edema   Brief History   53 year old female with COPD on home O2 and ETOH admitted with hypercarbia and hypoxia on 8/30 presumed secondary to COPD exacerbation +/- PNA. After ABX adjusted to unasyn she developed facial edema. PCCM consulted.    History of present illness   53 year old female with PMH as below, which is significant for COPD on home O2, ETOH, HTN, CHF, and OSA. She ran out of O2 at home and called EMS on 8/30 for SOB. O2 sats 42% on EMS arrival and improved with 6L [place]. Complained on minimally productive cough x a few days. Imaging concerning for edema vs infection. She was admitted to the hospitalist service and treated for COPD exacerbation and PNA. There was concern for aspiration and ABX were transitioned to Unasyn on 8/31. She did require BiPAP on 8/31 as well for hypercarbic failure with some altered sensorium. Her ABG did improve with BiPAP, but she developed facial edema presumably from Unasyn, which she claims to have no allergy to. PCCM was consulted for further evaluation.  Past Medical History  has a past medical history of Acute on chronic respiratory failure with hypoxia ([place]) (06/07/2014), Acute respiratory failure with hypoxia ([place]) (03/23/2017), Alcohol abuse, Anxiety, Arthritis, CHF (congestive heart failure) ([place]), Community acquired pneumonia, Depression, Headache(784.0), Hypertension, Insomnia, Interstitial lung disease ([place]), Oxygen dependent, Respiratory failure, acute and chronic ([place]) (05/09/2017), Schizophrenia ([place]), Shortness of breath dyspnea, Sleep apnea, and Sleep apnea.  Significant Hospital Events   8/30 admit 8/31: Started BiPAP, facial edema after change to unasyn.   Consults:  PCCM  Procedures:    Significant Diagnostic Tests:  HRCT chest 8/26 > Examination  is somewhat limited by body habitus, underpenetration, and breath motion artifact. Bandlike scarring or atelectasis of the medial right middle lobe and lingula, likely compressive due to cardiomegaly. Mild centrilobular emphysema and diffuse ground-glass opacity, the appearance of the latter possibly exaggerated by technical factors of perhaps reflecting smoking-related respiratory bronchiolitis. No evidence of fibrotic interstitial lung disease. Mild geographic attenuation of the airspaces, not clearly increased on expiratory phase imaging although nonetheless suggestive of small airways disease.Cardiomegaly and trace pericardial effusion.  CXR 8/31> Interstitial opacities-- edema vs atypical infection   Micro Data:  8/30 SARS CoV2> neg  Antimicrobials:  8/30 Azithromycin 8/31 Unasyn  Interim history/subjective:  PCCM consulted for facial edema after initiation of Unasyn Prior to consult, patient started on BiPAP for hypercarbia   Objective   Blood pressure (!) 153/88, pulse 96, temperature 98.6 F (37 C), temperature source Oral, resp. rate (!) 22, height _0  (1.626 m), weight 99.3 kg, last menstrual period 04/06/2017, SpO2 94 %.    FiO2 (%):  [8 %-60 %] 60 %   Intake/Output Summary (Last 24 hours) at 02/27/2019 1624 Last data filed at 02/27/2019 1500 Gross per 24 hour  Intake 610 ml  Output 2900 ml  Net -2290 ml   Filed Weights   02/26/19 1932 02/27/19 0228  Weight: 100.7 kg 99.3 kg    Examination: General: Obese adult F, NAD on bipap HENT: Periorbital and anterior facial swelling, erythema. Labia edema. No edema of tongue. Pink mmm Lungs: RUL LUL wheeze. No stridor. Symmetrical chest expansion. No accessory muscle use Cardiovascular: RRR s1s2 no rgm Abdomen: Obese, soft, round ndnt Extremities: Symmetrical bulk and tone trace LE  edema Neuro: Drowsy but awake, oriented x3. Following commands  GU: defer Skin: erythematous rash across chest   Resolved Hospital Problem  list     Assessment & Plan:   Anaphylaxis -with rash, facial swelling, mild wheeze, without shock  -after initiation of Unasyn abx -received 96m Benadryl, 647msolumedrol prior to consult P -Note Unasyn as allergy -NPO -Move to ICU for monitoring -BID pepcid -Continue benadryl -Continue solumedrol  -Close monitoring for advanced airway  Acute on chronic mixed hypoxic and hypercarbic respiratory failure -Suspected AECOPD. Hx COPD on home 4LNC.  -Tobacco use disorder -CXR concerning for PNA vs pulmonary edema -Started on unasyn 8/31 as above, also started on BiPAP P -Dc BiPAP -Supplemental O2 for SpO2 88-92% -minimize sedating agents -nicotine patch -Holding all abx now.  -Trend WBC -Send expectorated sputum  EtOH abuse -started on CIWA 8/31 P -dc CIWA please re-engage if needed -Continue MultiVit, B vit  MDD, anxiety, schizophrenia P -Have discontinued select home medications which are highly sedating due to acute respiratory concerns as above -Re-evaluate ability to resume home meds tomorrow 9/1  Hyperglycemia, at risk P -Monitor for now but anticipate may need SSI in setting of steroids   Best practice:  Diet: NPO Pain/Anxiety/Delirium protocol (if indicated): na VAP protocol (if indicated): na DVT prophylaxis: lovenox GI prophylaxis: Pepcid Glucose control: monitor Mobility: BR  Code Status: Full  Family Communication: patient updated  Disposition: Transfer to ICU   Labs   CBC: Recent Labs  Lab 02/26/19 2110 02/27/19 0237  WBC 9.1 6.0  NEUTROABS 4.2  --   HGB 17.3* 18.0*  HCT 65.1* 66.1*  MCV 84.4 83.0  PLT 238 26341  Basic Metabolic Panel: Recent Labs  Lab 02/26/19 2110 02/27/19 0237  NA 140 141  K 4.5 4.2  CL 93* 91*  CO2 35* 37*  GLUCOSE 102* 109*  BUN 9 9  CREATININE 0.65 0.71  CALCIUM 9.0 9.2   GFR: Estimated Creatinine Clearance: 96.3 mL/min (by C-G formula based on SCr of 0.71 mg/dL). Recent Labs  Lab 02/26/19 2110  02/27/19 0237  WBC 9.1 6.0    Liver Function Tests: Recent Labs  Lab 02/27/19 0237  AST 26  ALT 28  ALKPHOS 67  BILITOT 1.0  PROT 7.3  ALBUMIN 3.8   No results for input(s): LIPASE, AMYLASE in the last 168 hours. No results for input(s): AMMONIA in the last 168 hours.  ABG    Component Value Date/Time   PHART 7.337 (L) 02/27/2019 1530   PCO2ART 93.4 (HH) 02/27/2019 1530   PO2ART 76.4 (L) 02/27/2019 1530   HCO3 48.6 (H) 02/27/2019 1530   TCO2 28 01/04/2018 1208   O2SAT 93.8 02/27/2019 1530     Coagulation Profile: No results for input(s): INR, PROTIME in the last 168 hours.  Cardiac Enzymes: No results for input(s): CKTOTAL, CKMB, CKMBINDEX, TROPONINI in the last 168 hours.  HbA1C: Hgb A1c MFr Bld  Date/Time Value Ref Range Status  07/04/2016 05:58 PM 6.2 (H) 4.8 - 5.6 % Final    Comment:    (NOTE)         Pre-diabetes: 5.7 - 6.4         Diabetes: >6.4         Glycemic control for adults with diabetes: <7.0     CBG: Recent Labs  Lab 02/27/19 1618  GLUCAP 129*    Review of Systems:   Denies chest pain, palpitations Endorses drowsiness, tremor, confusion Endorses SOB, cough, wheeze Endorses facial  swelling, rash  Past Medical History  She,  has a past medical history of Acute on chronic respiratory failure with hypoxia ([place]) (06/07/2014), Acute respiratory failure with hypoxia ([place]) (03/23/2017), Alcohol abuse, Anxiety, Arthritis, CHF (congestive heart failure) ([place]), Community acquired pneumonia, Depression, Headache(784.0), Hypertension, Insomnia, Interstitial lung disease ([place]), Oxygen dependent, Respiratory failure, acute and chronic ([place]) (05/09/2017), Schizophrenia ([place]), Shortness of breath dyspnea, Sleep apnea, and Sleep apnea.   Surgical History    Past Surgical History:  Procedure Laterality Date  . BACK SURGERY    . LUNG BIOPSY Left 07/16/2014   Procedure: LUNG BIOPSY;  Surgeon: Grace Isaac, MD;  Location: [facility];  Service: Thoracic;   Laterality: Left;  Marland Kitchen MULTIPLE TOOTH EXTRACTIONS    . RIGHT/LEFT HEART CATH AND CORONARY ANGIOGRAPHY N/A 01/04/2018   Procedure: RIGHT/LEFT HEART CATH AND CORONARY ANGIOGRAPHY;  Surgeon: Adrian Prows, MD;  Location: [facility];  Service: Cardiovascular;  Laterality: N/A;  . VIDEO ASSISTED THORACOSCOPY Left 07/16/2014   Procedure: VIDEO ASSISTED THORACOSCOPY;  Surgeon: Grace Isaac, MD;  Location: [facility];  Service: Thoracic;  Laterality: Left;  Marland Kitchen VIDEO BRONCHOSCOPY Bilateral 04/11/2014   Procedure: VIDEO BRONCHOSCOPY WITH FLUORO;  Surgeon: Kathee Delton, MD;  [facility];  Service: Cardiopulmonary;  Laterality: Bilateral;  . VIDEO BRONCHOSCOPY N/A 07/16/2014   Procedure: VIDEO BRONCHOSCOPY;  Surgeon: Grace Isaac, MD;  [facility];  Service: Thoracic;  Laterality: N/A;  . WEDGE RESECTION Left 07/16/2014   Procedure: WEDGE RESECTION;  Surgeon: Grace Isaac, MD;  Location: [place];  Service: Thoracic;  Laterality: Left;  left upper lobe lung resection     Social History   reports that she has been smoking cigarettes. She has a 45.00 pack-year smoking history. She has never used smokeless tobacco. She reports current alcohol use. She reports current drug use. Drug: Cocaine.   Family History   Her family history includes Breast cancer in her mother; COPD in her maternal aunt; Obstructive Sleep Apnea in her mother.   Allergies Allergies  Allergen Reactions  . Unasyn [Ampicillin-Sulbactam Sodium] Rash  . Contrast Media [Iodinated Diagnostic Agents] Nausea And Vomiting     Home Medications  Prior to Admission medications   Medication Sig Start Date End Date Taking? Authorizing Provider  albuterol (PROVENTIL HFA;VENTOLIN HFA) 108 (90 BASE) MCG/ACT inhaler Inhale 2 puffs into the lungs every 6 (six) hours as needed for wheezing or shortness of breath. 05/09/15  Yes Juanito Doom, MD  albuterol (PROVENTIL) (2.5 MG/3ML) 0.083% nebulizer solution Take 3 mLs (2.5 mg  total) by nebulization every 6 (six) hours as needed for wheezing or shortness of breath. 02/06/19  Yes Martyn Ehrich, NP  ARIPiprazole (ABILIFY) 5 MG tablet Take 5 mg by mouth daily. 02/23/19  Yes [provider]  budesonide-formoterol (SYMBICORT) 160-4.5 MCG/ACT inhaler INHALE TWO puffs into THE lungs TWICE DAILY Patient taking differently: Inhale 2 puffs into the lungs 2 (two) times daily.  02/06/19  Yes Martyn Ehrich, NP  buPROPion (WELLBUTRIN SR) 150 MG 12 hr tablet Take 150 mg by mouth daily. 02/14/19  Yes [provider]  gabapentin (NEURONTIN) 300 MG capsule Take 300 mg by mouth 3 (three) times daily.   Yes [provider]  ibuprofen (ADVIL) 800 MG tablet Take 400 mg by mouth every 8 (eight) hours as needed (pain).   Yes [provider]  nicotine (NICODERM CQ - DOSED IN MG/24 HR) 7 mg/24hr patch Place 7 mg onto the skin daily  as needed (smoking cessation).   Yes [provider]  nicotine polacrilex (NICORETTE) 2 MG gum Take 2 mg by mouth as needed for smoking cessation.   Yes [provider]  OXYGEN Inhale 4 L into the lungs continuous.    Yes [provider]  predniSONE (DELTASONE) 10 MG tablet Take 4 tabs po daily x 3 days; then 3 tabs daily x3 days; then 2 tabs daily x3 days; then 1 tab daily x 3 days; then stop Patient taking differently: Take 10-40 mg by mouth See admin instructions. Take 4 tabs (40 mg) by mouth daily x 3 days; then 3 tabs (30 mg) daily x3 days; then 2 tabs (20 mg) daily x3 days; then 1 tab (10 mg) daily x 3 days; then stop 02/06/19  Yes Martyn Ehrich, NP  QUEtiapine (SEROQUEL) 100 MG tablet Take 100 mg by mouth every morning. 01/31/19  Yes [provider]  QUEtiapine (SEROQUEL) 200 MG tablet Take 200 mg by mouth at bedtime. 02/11/19  Yes [provider]  traZODone (DESYREL) 100 MG tablet Take 100 mg by mouth at bedtime.   Yes [provider]  amLODipine (NORVASC) 5 MG tablet  Take 1 tablet (5 mg total) by mouth daily. Patient not taking: Reported on 02/26/2019 01/17/18   Blanchie Dessert, MD  naproxen (NAPROSYN) 375 MG tablet Take 1 tablet (375 mg total) by mouth 2 (two) times daily. Patient not taking: Reported on 02/26/2019 12/05/18   Wieters, Hallie C, PA-C  QUEtiapine (SEROQUEL) 300 MG tablet Take 1 tablet (300 mg total) by mouth at bedtime. Patient not taking: Reported on 02/26/2019 06/18/17   Arfeen, Arlyce Harman, MD  rosuvastatin (CRESTOR) 10 MG tablet Take 1 tablet (10 mg total) by mouth daily. Patient not taking: Reported on 02/26/2019 01/17/18   Blanchie Dessert, MD     Critical care time: 35 min    Critical care time was exclusive of separately billable procedures and treating other patients. Critical care was necessary to treat or prevent imminent or life-threatening deterioration. Critical care was time spent personally by me on the following activities: development of treatment plan with patient and/or surrogate as well as nursing, discussions with consultants, evaluation of patient's response to treatment, examination of patient, obtaining history from patient or surrogate, ordering and performing treatments and interventions, ordering and review of laboratory studies, ordering and review of radiographic studies, pulse oximetry and re-evaluation of patient's condition.  Eliseo Gum MSN, AGACNP-BC [place] [phone] If no answer, [phone] 02/27/2019, 4:54 PM  Attending Note:  53 year old female with ES-COPD at this point who continues to smoke 2 PPD and alcoholic who presented to PCCM with chronic hypercarbic respiratory failure, acute encephalopathy (multi-factorial) and concern for anaphylactic reaction due to unasyn.  On exam, patient is protecting her airway but I am concerned that she may loose her airway given the edema and tongue size after unasyn.  I reviewed CXR myself, pulmonary edema noted.  Discussed with PCCM-NP.   WIll transfer to the ICU.  D/C BiPAP.  Patient's CO2 level is where she will be.  Titrate O2 for sats of 88-92%.  Steroids and H2 blockers.  List penicillin as an allergy.  PCCM will assume care for monitoring the airway.  The patient is critically ill with multiple organ systems failure and requires high complexity decision making for assessment and support, frequent evaluation and titration of therapies, application of advanced monitoring technologies and extensive interpretation of multiple databases.   Critical Care  Time devoted to patient care services described in this note is  45  Minutes. This time reflects time of care of this signee Dr Jennet Maduro. This critical care time does not reflect procedure time, or teaching time or supervisory time of PA/NP/Med student/Med Resident etc but could involve care discussion time.  Rush Farmer, M.D. [facility] Pulmonary/Critical Care Medicine. Pager: [phone]. After hours pager: [phone].

## 2019-02-27 NOTE — ED Notes (Signed)
Report attempted, left number for call back

## 2019-02-27 NOTE — Progress Notes (Signed)
SLP Cancellation Note  Patient Details Name: Diane Gilbert MRN: [id number] DOB: 03/17/1966   Cancelled treatment:       Reason Eval/Treat Not Completed: Medical issues which prohibited therapy; pt currently on Bipap; RN reports currently holding PO due to worsening respiratory status. SLP to follow up for bedside swallow evaluation as pt status allows.    [place], CCC-SLP Acute Rehabilitation Services  02/27/2019, 3:43 PM

## 2019-02-27 NOTE — Progress Notes (Signed)
Orders placed to put patient on bipap.  Patient placed on bipap and is currently tolerating well.  Will continue to monitor.

## 2019-02-28 ENCOUNTER — Inpatient Hospital Stay (HOSPITAL_COMMUNITY): Payer: Medicaid Other

## 2019-02-28 ENCOUNTER — Telehealth: Payer: Self-pay | Admitting: *Deleted

## 2019-02-28 MED ORDER — ENOXAPARIN SODIUM 60 MG/0.6ML ~~LOC~~ SOLN
50.0000 mg | Freq: Every day | SUBCUTANEOUS | Status: DC
  Administered 2019-02-28 – 2019-03-01 (×2): 50 mg via SUBCUTANEOUS
  Filled 2019-02-28 (×2): qty 0.6

## 2019-02-28 MED ORDER — QUETIAPINE FUMARATE 200 MG PO TABS: 300.0000 mg | ORAL_TABLET | Freq: Every day | ORAL | Status: DC

## 2019-02-28 MED ORDER — IPRATROPIUM BROMIDE 0.02 % IN SOLN
0.5000 mg | Freq: Four times a day (QID) | RESPIRATORY_TRACT | Status: DC
  Administered 2019-02-28 – 2019-03-01 (×6): 0.5 mg via RESPIRATORY_TRACT
  Filled 2019-02-28 (×6): qty 2.5

## 2019-02-28 MED ORDER — METHYLPREDNISOLONE SODIUM SUCC 40 MG IJ SOLR
40.0000 mg | Freq: Three times a day (TID) | INTRAMUSCULAR | Status: DC
  Administered 2019-02-28 – 2019-03-01 (×4): 40 mg via INTRAVENOUS
  Filled 2019-02-28 (×3): qty 1

## 2019-02-28 MED ORDER — QUETIAPINE FUMARATE 100 MG PO TABS
100.0000 mg | ORAL_TABLET | Freq: Every day | ORAL | Status: DC
  Administered 2019-02-28 – 2019-03-01 (×2): 100 mg via ORAL
  Filled 2019-02-28 (×2): qty 1

## 2019-02-28 MED ORDER — FUROSEMIDE 40 MG PO TABS
40.0000 mg | ORAL_TABLET | Freq: Every day | ORAL | Status: DC
  Administered 2019-02-28 – 2019-03-01 (×2): 40 mg via ORAL
  Filled 2019-02-28 (×2): qty 1

## 2019-02-28 MED ORDER — DIPHENHYDRAMINE HCL 25 MG PO CAPS: 25.0000 mg | ORAL_CAPSULE | Freq: Four times a day (QID) | ORAL | Status: DC | PRN

## 2019-02-28 MED ORDER — FAMOTIDINE 20 MG PO TABS
20.0000 mg | ORAL_TABLET | Freq: Every day | ORAL | Status: DC
  Administered 2019-02-28: 22:00:00 20 mg via ORAL
  Filled 2019-02-28: qty 1

## 2019-02-28 MED ORDER — QUETIAPINE FUMARATE 200 MG PO TABS
200.0000 mg | ORAL_TABLET | Freq: Every day | ORAL | Status: DC
  Administered 2019-02-28: 200 mg via ORAL
  Filled 2019-02-28: qty 1

## 2019-02-28 MED ORDER — AMLODIPINE BESYLATE 5 MG PO TABS
5.0000 mg | ORAL_TABLET | Freq: Every day | ORAL | Status: DC
  Administered 2019-02-28 – 2019-03-01 (×2): 5 mg via ORAL
  Filled 2019-02-28 (×2): qty 1

## 2019-02-28 NOTE — Telephone Encounter (Signed)
Patient still in hospital, once she is discharged we can get her scheduled for an appointment per DS recs in our office. Will route to triage to keep and eye on

## 2019-02-28 NOTE — Evaluation (Signed)
Clinical/Bedside Swallow Evaluation Patient Details  Name: Diane Gilbert MRN: [id number] Date of Birth: 1965/11/03  Today's Date: 02/28/2019 Time: SLP Start Time (ACUTE ONLY): 3 SLP Stop Time (ACUTE ONLY): 0950 SLP Time Calculation (min) (ACUTE ONLY): 35 min  Past Medical History:  Past Medical History:  Diagnosis Date  . Acute on chronic respiratory failure with hypoxia ([place]) 06/07/2014  . Acute respiratory failure with hypoxia ([place]) 03/23/2017  . Alcohol abuse   . Anxiety   . Arthritis   . CHF (congestive heart failure) ([place])   . Community acquired pneumonia   . Depression   . Headache(784.0)   . Hypertension   . Insomnia   . Interstitial lung disease ([place])   . Oxygen dependent   . Respiratory failure, acute and chronic ([place]) 05/09/2017  . Schizophrenia ([place])   . Shortness of breath dyspnea   . Sleep apnea   . Sleep apnea    Past Surgical History:  Past Surgical History:  Procedure Laterality Date  . BACK SURGERY    . LUNG BIOPSY Left 07/16/2014   Procedure: LUNG BIOPSY;  Surgeon: Grace Isaac, MD;  Location: [facility];  Service: Thoracic;  Laterality: Left;  Marland Kitchen MULTIPLE TOOTH EXTRACTIONS    . RIGHT/LEFT HEART CATH AND CORONARY ANGIOGRAPHY N/A 01/04/2018   Procedure: RIGHT/LEFT HEART CATH AND CORONARY ANGIOGRAPHY;  Surgeon: Adrian Prows, MD;  Location: [place] [facility];  Service: Cardiovascular;  Laterality: N/A;  . VIDEO ASSISTED THORACOSCOPY Left 07/16/2014   Procedure: VIDEO ASSISTED THORACOSCOPY;  Surgeon: Grace Isaac, MD;  Location: [place];  Service: Thoracic;  Laterality: Left;  Marland Kitchen VIDEO BRONCHOSCOPY Bilateral 04/11/2014   Procedure: VIDEO BRONCHOSCOPY WITH FLUORO;  Surgeon: Kathee Delton, MD;  [facility];  Service: Cardiopulmonary;  Laterality: Bilateral;  . VIDEO BRONCHOSCOPY N/A 07/16/2014   Procedure: VIDEO BRONCHOSCOPY;  Surgeon: Grace Isaac, MD;  [facility];  Service: Thoracic;  Laterality: N/A;  . WEDGE RESECTION Left  07/16/2014   Procedure: WEDGE RESECTION;  Surgeon: Grace Isaac, MD;  Location: [facility] OR;  Service: Thoracic;  Laterality: Left;  left upper lobe lung resection   HPI:  53 y.o. female, admitted with SOB, desat. PMH: schizophrenia, anxiety/depression, h/o Etoh abuse, hypertension, COPD on home O2 2L , CHF (diastolic), OSA. CXR = Cardiomegaly and increasing interstitial edema consistent with congestive heart failure. Left greater than right pleural effusions and associated airspace disease. While this likely reflects atelectasis, infection is not excluded.     Assessment / Plan / Recommendation Clinical Impression  Pt seen at bedside for clinical swallow evaluation. RN reports intermittent coughing after po intake. Pt was seated upright in bed, awake upon arrival of SLP. Cough noted x1 in the absence of po trials. Pt presents with adequate dentition (missing 2 back teeth per pt), CN exam unremarkable. Pt passed [facility] swallow screen, indicating low likelihood of aspiration. However, pt reports intermittent chest discomfort, exhibits belching after po intake, and has coughing several minutes after po intake has ceased. These symptoms raise concern for primary esophageal dysphagia. Pt reports no history of GERD or esophageal dilation, however, she verbalizes that she may in fact have reflux. Recommend proceeding with regular barium swallow to evaluate esophageal motility. SLP will follow up after results are available to assess diet tolerance and provide education. RN and MD informed.  SLP Visit Diagnosis: Dysphagia, unspecified (R13.10)    Aspiration Risk  Mild aspiration risk    Diet Recommendation Regular;Thin liquid   Liquid Administration via:  Cup;Straw Medication Administration: Whole meds with liquid Supervision: Patient able to self feed;Intermittent supervision to cue for compensatory strategies Compensations: Minimize environmental distractions;Slow rate;Small sips/bites Postural  Changes: Seated upright at 90 degrees;Remain upright for at least 30 minutes after po intake    Other  Recommendations Recommended Consults: Consider esophageal assessment Oral Care Recommendations: Oral care BID    Follow up Recommendations SLP will follow up after esophageal work up, for assessment of diet tolerance and education     Frequency and Duration min 2x/week  1 week;2 weeks       Prognosis Prognosis for Safe Diet Advancement: Good      Swallow Study   General Date of Onset: 02/27/19 Type of Study: Bedside Swallow Evaluation Previous Swallow Assessment: none found Diet Prior to this Study: Thin liquids(clear liquids) Temperature Spikes Noted: No Respiratory Status: Nasal cannula History of Recent Intubation: No Behavior/Cognition: Alert;Cooperative;Pleasant mood Oral Cavity Assessment: Within Functional Limits Oral Care Completed by SLP: No Oral Cavity - Dentition: Adequate natural dentition Vision: Functional for self-feeding Self-Feeding Abilities: Able to feed self Patient Positioning: Upright in bed Baseline Vocal Quality: Normal Volitional Cough: Strong Volitional Swallow: Able to elicit    Oral/Motor/Sensory Function Overall Oral Motor/Sensory Function: Within functional limits   Ice Chips Ice chips: Not tested   Thin Liquid Thin Liquid: Within functional limits Presentation: Straw Other Comments: pt passed 3oz water challenge    Nectar Thick Nectar Thick Liquid: Not tested   Honey Thick Honey Thick Liquid: Not tested   Puree Puree: Within functional limits Presentation: Spoon;Self Fed   Solid     Solid: Within functional limits Presentation: [facility], [place] Speech Language Pathologist [phone]  Shonna Chock 02/28/2019,10:04 AM

## 2019-02-28 NOTE — Evaluation (Signed)
Physical Therapy Evaluation Patient Details Name: Diane Gilbert MRN: [id number] DOB: 1966-02-20 Today's Date: 02/28/2019   History of Present Illness  53 year old female with OHS and possible interstitial lung disease on home O2 presenting with acute on chronic hypoxemic and hypercapneic respiratory failure.  Past medical history of Acute respiratory failure with hypoxia, Alcohol abuse, Anxiety, Arthritis, CHF, Community acquired pneumonia, Depression, Headache, Hypertension, Insomnia, Interstitial lung disease, Oxygen dependent, Respiratory failure, acute and chronic, Schizophrenia, Shortness of breath dyspnea, and Sleep apnea.  Clinical Impression  Patient presents with mobility likely not far off from baseline, but with some discomfort from tingling in her feet.  She ambulated about 100' on unit and back to room with RW and min A for safety and lines on 4L O2 with VSS.  Currently stable for home when medically ready and had already started at outpatient PT so would recommend continuing/resuming when stable.  Pt reporting feels her falls may also be due to Gabapentin, so may need dose regulation.  PT to follow acutely.    Follow Up Recommendations Supervision - Intermittent;Outpatient PT    Equipment Recommendations  Other (comment)(rollator (4 wheeled walker))    Recommendations for Other Services       Precautions / Restrictions Precautions Precautions: Fall Precaution Comments: reports fall as recent as 2 weeks ago, feels her Gabapentin plays a role      Mobility  Bed Mobility Overal bed mobility: Needs Assistance Bed Mobility: Supine to Sit     Supine to sit: Supervision;HOB elevated     General bed mobility comments: assist with lines, safety  Transfers Overall transfer level: Needs assistance Equipment used: None;Rolling walker (2 wheeled) Transfers: Sit to/from Stand Sit to Stand: Min guard;Min assist         General transfer comment: up from EOB and  toilet with A for balance, initially no device then with RW and minguard  Ambulation/Gait Ambulation/Gait assistance: Min assist;+2 safety/equipment Gait Distance (Feet): 100 Feet Assistive device: Rolling walker (2 wheeled) Gait Pattern/deviations: Step-through pattern;Decreased stride length;Shuffle;Trunk flexed     General Gait Details: flexed due to walker height, c/o tingiling in feet throughout ambulation, A for balance/safety with O2 and heart monitor  Stairs            Wheelchair Mobility    Modified Rankin (Stroke Patients Only)       Balance Overall balance assessment: Needs assistance   Sitting balance-Leahy Scale: Good Sitting balance - Comments: able to perform toilet hygiene and obtain toilet paper in bathroom no assist   Standing balance support: No upper extremity supported Standing balance-Leahy Scale: Fair Standing balance comment: initial standing no device while helping with lines and S assist only                             Pertinent Vitals/Pain Pain Assessment: Faces Pain Score: 4  Faces Pain Scale: Hurts little more Pain Location: chest pain, abdomen, feet Pain Descriptors / Indicators: Sore;Tingling Pain Intervention(s): Monitored during session;Repositioned    Home Living Family/patient expects to be discharged to:: Private residence Living Arrangements: Non-relatives/Friends(roomate) Available Help at Discharge: Personal care attendant(2 hours 7 days a week) Type of Home: Apartment Home Access: Stairs to enter   [employer] of Steps: 1 Home Layout: One level Home Equipment: Walker - 2 wheels;Bedside commode Additional Comments: would like a 4 wheeled walker    Prior Function Level of Independence: Independent with assistive device(s)  Comments: uses O2 at home     Hand Dominance        Extremity/Trunk Assessment   Upper Extremity Assessment Upper Extremity Assessment: Generalized  weakness    Lower Extremity Assessment Lower Extremity Assessment: Generalized weakness       Communication   Communication: No difficulties  Cognition Arousal/Alertness: Awake/alert Behavior During Therapy: WFL for tasks assessed/performed Overall Cognitive Status: No family/caregiver present to determine baseline cognitive functioning                                 General Comments: oriented to place and situation, needed cues for correct month & day; forgot to replace O2 in nose after blowing her nose      General Comments General comments (skin integrity, edema, etc.): VSS on 4L O2 with ambulation    Exercises     Assessment/Plan    PT Assessment    PT Problem List         PT Treatment Interventions      PT Goals (Current goals can be found in the Care Plan section)  Acute Rehab PT Goals Patient Stated Goal: to go back to PT PT Goal Formulation: With patient Time For Goal Achievement: 03/14/19 Potential to Achieve Goals: Good    Frequency     Barriers to discharge        Co-evaluation               AM-PAC PT "6 Clicks" Mobility  Outcome Measure Help needed turning from your back to your side while in a flat bed without using bedrails?: None Help needed moving from lying on your back to sitting on the side of a flat bed without using bedrails?: A Little Help needed moving to and from a bed to a chair (including a wheelchair)?: A Little Help needed standing up from a chair using your arms (e.g., wheelchair or bedside chair)?: A Little Help needed to walk in hospital room?: A Little Help needed climbing 3-5 steps with a railing? : A Little 6 Click Score: 19    End of Session Equipment Utilized During Treatment: Gait belt;Oxygen Activity Tolerance: Patient tolerated treatment well Patient left: in chair;with call bell/phone within reach;with chair alarm set Nurse Communication: Mobility status PT Visit Diagnosis: Other abnormalities  of gait and mobility (R26.89);Other symptoms and signs involving the nervous system (B90.383)    Time: 3383-2919 PT Time Calculation (min) (ACUTE ONLY): 29 min   Charges:   PT Evaluation $PT Eval Moderate Complexity: 1 Mod PT Treatments $Gait Training: 8-22 mins        Magda Kiel, PT Acute Rehabilitation Services [phone] 02/28/2019   Reginia Naas 02/28/2019, 1:28 PM

## 2019-02-28 NOTE — Progress Notes (Signed)
RT instructed patient on the use of incentive spirometer. Patient able to reach 500 mL with coaching.

## 2019-02-28 NOTE — Consult Note (Addendum)
This is a progress note.    NAME:  Diane Gilbert, MRN:  [id number], DOB:  12-07-1965, LOS: 2 ADMISSION DATE:  02/26/2019, CONSULTATION DATE:  02/27/2019 REFERRING MD:  Earnest Conroy- [facility], CHIEF COMPLAINT:  Facial Edema   Brief History   53 year old female with OHS and possible interstitial lung disease on home O2 presenting with acute on chronic hypoxemic and hypercapneic respiratory failure  History of present illness   53 year old female with PMH as below, which is significant for possible ILD, OHS, , ETOH, HTN, CHF, and OSA. She ran out of O2 at home and called EMS on 8/30 for SOB. O2 sats 42% on EMS arrival and improved with 6L [place]. Complained on minimally productive cough x a few days. Imaging concerning for edema vs infection. She was admitted to the hospitalist service and treated for COPD exacerbation and PNA. There was concern for aspiration and ABX were transitioned to Unasyn on 8/31. She did require BiPAP on 8/31 as well for hypercarbic failure with some altered sensorium. Her ABG did improve with BiPAP, but she developed facial edema presumably from Unasyn, which she claims to have no allergy to. PCCM was consulted for further evaluation.  Past Medical History  has a past medical history of Acute on chronic respiratory failure with hypoxia ([place]) (06/07/2014), Acute respiratory failure with hypoxia ([place]) (03/23/2017), Alcohol abuse, Anxiety, Arthritis, CHF (congestive heart failure) ([place]), Community acquired pneumonia, Depression, Headache(784.0), Hypertension, Insomnia, Interstitial lung disease ([place]), Oxygen dependent, Respiratory failure, acute and chronic ([place]) (05/09/2017), Schizophrenia ([place]), Shortness of breath dyspnea, Sleep apnea, and Sleep apnea.  Significant Hospital Events   8/30 admit 8/31: Started BiPAP, facial edema after change to unasyn.   Consults:  PCCM  Procedures:    Significant Diagnostic Tests:  HRCT chest 8/26 > Examination is somewhat limited by body  habitus, underpenetration, and breath motion artifact. Bandlike scarring or atelectasis of the medial right middle lobe and lingula, likely compressive due to cardiomegaly. Mild centrilobular emphysema and diffuse ground-glass opacity, the appearance of the latter possibly exaggerated by technical factors of perhaps reflecting smoking-related respiratory bronchiolitis. No evidence of fibrotic interstitial lung disease. Mild geographic attenuation of the airspaces, not clearly increased on expiratory phase imaging although nonetheless suggestive of small airways disease.Cardiomegaly and trace pericardial effusion.  CXR 8/31> Interstitial opacities-- edema vs atypical infection   Micro Data:  8/30 SARS CoV2> neg  Antimicrobials:  8/30 Azithromycin 8/31 Unasyn  Interim history/subjective:  PCCM consulted for facial edema after initiation of Unasyn Prior to consult, patient started on BiPAP for hypercarbia   Objective   Blood pressure (!) 154/94, pulse 95, temperature 98.6 F (37 C), temperature source Oral, resp. rate (!) 29, height _0  (1.626 m), weight 99.3 kg, last menstrual period 04/06/2017, SpO2 96 %.    FiO2 (%):  [60 %] 60 %   Intake/Output Summary (Last 24 hours) at 02/28/2019 0756 Last data filed at 02/28/2019 0600 Gross per 24 hour  Intake 248.28 ml  Output 1950 ml  Net -1701.72 ml   Filed Weights   02/26/19 1932 02/27/19 0228  Weight: 100.7 kg 99.3 kg    Examination: GEN: obese woman in NAD HEENT: malampatti 4, mild facial edema and labial swelling CV: RRR, pronounced P2 PULM: Occasional crackles at bases, no stridor, no accessory muscle use GI: Soft, +BS EXT: No edema NEURO: Moves all 4 ext to command PSYCH: flat affect, AOx3 SKIN: No rashes   Resolved Hospital Problem list     Assessment &  Plan:   # Anaphylaxis- to unasyn, improved - Switch benadryl to PO - Drop steroids in half (see resp failure problem)  # Acute on chronic mixed hypoxic and  hypercarbic respiratory failure- - 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  On no DMARDs, still smoking and has psychiatric disorder this smoking is helping with, between rock and hard place with this.  ABG with impressive chronic respiratory insufficiency.  No obstruction on PFTs.  RV enlarged on echo, RHC last year did not show much pulm HTN.  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 - IV steroids for today, tomorrow would do prednisone 52m/day x 1 week, 281mday x 1 week, 1045may thereafter, will arrange pulm clinic f/u in 1 mo - Dulera fine, switch duonebs to ipratropium QID - Needs to mobilize, suspicion she may need SNF - I see no role for abx here - Switch IV lasix to PO  EtOH abuse- no signs of w/d - Continue MVNs as ordered  MDD, anxiety, schizophrenia - Resume home meds - OP psych f/u  Witnessed coughing with eating solid - SLP eval  HTN- restart amlodipine, continue losartan  Best practice:  Diet: does okay with liquids, will try liquid diet today Pain/Anxiety/Delirium protocol (if indicated):home meds VAP protocol (if indicated): na DVT prophylaxis: lovenox GI prophylaxis: would do qHS pepcid with high steroid dose Glucose control: not needed Mobility: BR  Code Status: Full  Family Communication: patient updated  Disposition: transfer to progressive

## 2019-02-28 NOTE — Telephone Encounter (Signed)
-----  Message from Candee Furbish, MD sent at 02/28/2019  8:36 AM EDT ----- Regarding: Hospitalization f/u In 1 month with me or midlevel; former [facility] patient  Thanks, Linna Hoff

## 2019-02-28 NOTE — TOC Initial Note (Addendum)
Transition of Care [facility]) - Initial/Assessment Note    Patient Details  Name: Diane Gilbert MRN: [id number] Date of Birth: January 04, 1966  Transition of Care [facility]) CM/SW Contact:    Bartholomew Crews, RN Phone Number: [phone] 02/28/2019, 11:44 AM  Clinical Narrative:                 Spoke with patient at bedside. PTA home with caregiver who assists with adls and Iadls.   Uses [facility] for medical appointments. Will need cab voucher at time of discharge.   Uses [facility] - (Verified with [employer] that they are her oxygen provider for oxygen) and has concentrator and tanks at home. Baseline O2 is 4l/m. [employer] will provide her with 2 tanks for transport home. Additional DME at home includes walker and a cane.  PCP: Nolene Ebbs, MD. Pt reports regular f/u.   Attends outpatient PT at [facility]. Location.   Pharmacy is [facility] which delivers and she states that she doesn't have a copay for medications.   States that her mom passes away in [place] on 08-12-2022 and her niece is traveling to to get her in order to transport to funeral. Patient is originally from [place].   Patient to transition to progressive bed when bed available. CM to follow for transition of care needs.   Expected Discharge Plan: Home/Self Care Barriers to Discharge: Continued Medical Work up   Patient Goals and CMS Choice Patient states their goals for this hospitalization and ongoing recovery are:: return home CMS Medicare.gov Compare Post Acute Care list provided to:: Patient Choice offered to / list presented to : NA  Expected Discharge Plan and Services Expected Discharge Plan: Home/Self Care In-house Referral: NA Discharge Planning Services: CM Consult Post Acute Care Choice: NA Living arrangements for the past 2 months: Apartment                 DME Arranged: Oxygen DME Agency: Other - Comment([facility])       HH Arranged: NA [place] Agency: NA        Prior Living  Arrangements/Services Living arrangements for the past 2 months: Apartment Lives with:: Self, Roommate Patient language and need for interpreter reviewed:: Yes Do you feel safe going back to the place where you live?: Yes      Need for Family Participation in Patient Care: Yes (Comment) Care giver support system in place?: Yes (comment) Current home services: DME, Other (comment)(outpatient PT at [place] [facility]) Criminal Activity/Legal Involvement Pertinent to Current Situation/Hospitalization: No - Comment as needed  Activities of Daily Living      Permission Sought/Granted                  Emotional Assessment Appearance:: Appears older than stated age Attitude/Demeanor/Rapport: Engaged Affect (typically observed): Accepting Orientation: : Oriented to Situation, Oriented to  Time, Oriented to Place, Oriented to Self Alcohol / Substance Use: Not Applicable Psych Involvement: No (comment)  Admission diagnosis:  Acute on chronic respiratory failure with hypoxia ([place]) [J96.21] Patient Active Problem List   Diagnosis Date Noted  . Acute respiratory failure with hypoxia ([place]) 02/26/2019  . Falls 02/06/2019  . Depression 02/06/2019  . Chronic diastolic CHF (congestive heart failure) ([place]) 05/09/2017  . Facial swelling 03/23/2017  . Chronic respiratory failure with hypoxia ([place]) 03/23/2017  . OSA (obstructive sleep apnea) 07/21/2016  . COPD with acute exacerbation ([place]) 07/04/2016  . Acute diastolic congestive heart failure ([place])   . Schizophrenia ([place]) 03/24/2016  . Leg swelling  01/16/2016  . Interstitial lung disease ([place]) 07/16/2014  . Respiratory bronchiolitis interstitial lung disease ([place]) 12/26/2013  . Irregular menstrual cycle 01/23/2013  . Smoking addiction 01/23/2013  . Alcohol abuse 02/19/2012  . Hyponatremia 02/19/2012  . Abdominal pain 02/19/2012  . Hypokalemia 02/19/2012   PCP:  Nolene Ebbs, MD Pharmacy:   [id number],  [place] [address] Phone: [phone] Fax: [place], [place] [address] Phone: [phone] Fax: [phone]     Social Determinants of Health (SDOH) Interventions    Readmission Risk Interventions No flowsheet data found.

## 2019-03-01 ENCOUNTER — Ambulatory Visit: Payer: Medicaid Other | Attending: Primary Care | Admitting: Physical Therapy

## 2019-03-01 DIAGNOSIS — F17200 Nicotine dependence, unspecified, uncomplicated: Secondary | ICD-10-CM

## 2019-03-01 DIAGNOSIS — F203 Undifferentiated schizophrenia: Secondary | ICD-10-CM

## 2019-03-01 DIAGNOSIS — J84115 Respiratory bronchiolitis interstitial lung disease: Secondary | ICD-10-CM

## 2019-03-01 DIAGNOSIS — J9621 Acute and chronic respiratory failure with hypoxia: Secondary | ICD-10-CM

## 2019-03-01 MED ORDER — ADULT MULTIVITAMIN W/MINERALS CH: 1.0000 | ORAL_TABLET | Freq: Every day | ORAL | 0 refills | Status: DC

## 2019-03-01 MED ORDER — DIPHENHYDRAMINE HCL 25 MG PO CAPS: 25.0000 mg | ORAL_CAPSULE | Freq: Four times a day (QID) | ORAL | 0 refills | Status: DC | PRN

## 2019-03-01 MED ORDER — LOSARTAN POTASSIUM 50 MG PO TABS: 50.0000 mg | ORAL_TABLET | Freq: Every day | ORAL | 0 refills | Status: DC

## 2019-03-01 MED ORDER — FAMOTIDINE 20 MG PO TABS: 20.0000 mg | ORAL_TABLET | Freq: Every day | ORAL | 0 refills | Status: DC

## 2019-03-01 MED ORDER — PREDNISONE 20 MG PO TABS: ORAL_TABLET | ORAL | 0 refills | Status: DC

## 2019-03-01 NOTE — Progress Notes (Signed)
  Speech Language Pathology Treatment:    Patient Details Name: Diane Gilbert MRN: [id number] DOB: 04-09-1966 Today's Date: 03/01/2019 Time: 0950-1000 SLP Time Calculation (min) (ACUTE ONLY): 10 min  Assessment / Plan / Recommendation Clinical Impression  Very limited interaction with pt this morning, due to lethargy. RN reports pt tolerating po intake and medications without observable difficulty. Barium Swallow completed yesterday. No significant findings. SLP will follow up once more for education/diet tolerance assessment.     HPI 53 y.o. female, admitted with SOB, desat. PMH: schizophrenia, anxiety/depression, h/o Etoh abuse, hypertension, COPD on home O2 2L [place], CHF (diastolic), OSA. CXR = Cardiomegaly and increasing interstitial edema consistent with congestive heart failure. Left greater than right pleural effusions and associated airspace disease. While this likely reflects atelectasis, infection is not excluded.      SLP Plan  Continue with current plan of care       Recommendations  Diet recommendations: Regular;Thin liquid Liquids provided via: Cup;Straw Medication Administration: Whole meds with liquid Supervision: Patient able to self feed;Intermittent supervision to cue for compensatory strategies Compensations: Minimize environmental distractions;Slow rate;Small sips/bites Postural Changes and/or Swallow Maneuvers: Seated upright 90 degrees;Upright 30-60 min after meal                Oral Care Recommendations: Oral care BID Follow up Recommendations: None SLP Visit Diagnosis: Dysphagia, unspecified (R13.10) Plan: Continue with current plan of care       GO                [facility], [place] Speech Language Pathologist [phone]  Diane Gilbert 03/01/2019, 10:41 AM

## 2019-03-01 NOTE — Discharge Summary (Signed)
Physician Discharge Summary  KENEDI CILIA HQR:[id number] DOB: Jan 25, 1966 DOA: 02/26/2019  PCP: Nolene Ebbs, MD  Admit date: 02/26/2019 Discharge date: 03/01/2019  Admitted From: Home Disposition: Home  Recommendations for Outpatient Follow-up:  1. Continue to encourage smoking cessation Discharge Condition: Stable CODE STATUS: Full code Diet recommendation: Heart healthy Consultations:  Pulmonary   Discharge Diagnoses:  Principal Problem:   Acute respiratory failure with hypoxia ([place]) Active Problems:    Respiratory bronchiolitis interstitial lung disease (HCC)   Schizophrenia (HCC)   Smoking addiction Allergic reaction to Unasyn with anaphylaxis  Brief Summary: Diane Gilbert  is a 53 y.o. female, w schizophrenia, anxiety/ depression, , h/o Etoh abuse, hypertension, Copd on home O2 - 4L [place], CHF (diastolic), OSA apparently ran out of o2 at home.  Pt had no oxygen for about 6 hours,  When EMS arrived her O2 sat was 42% and then came up to 94% with 6L [place].  She admitted to increasing dyspnea over the past few days with a mild cough productive of white sputum.    Chest x-ray showed increased interstitial opacities- suggestive of possible pulmonary edema versus atypical infection. She was admitted with a diagnosis of COPD exacerbation and acute on chronic diastolic heart failure.  She was started on Solu-Medrol, Zithromax, nebulizer treatments and IV Lasix.  Hospital Course:  Acute on chronic respiratory failure/respiratory bronchiolitis interstitial lung disease/moderate pulmonary hypertension -Followed as outpatient by [place] [facility] - Suspected to be due to acute COPD exacerbation  -On 8/31, it was suspected by the rounding physician that she may have aspirated and therefore azithromycin was changed to [facility] subsequently had an anaphylactic reaction to this although she stated that she had previously taken amoxicillin without issues.  She was treated with IV  Benadryl and Solu-Medrol. -Pulmonary consult was also requested and she was transferred to the ICU service to be monitored -On 9/1 her symptoms had improved-pulmonary recommended to continue IV steroids and on the following day patient to prednisone - 9/1 pulmonary also recommended that antibiotic should be discontinued and Lasix be changed to oral - They will arrange follow-up in the pulmonary clinic in 1 month - FiO2 has been weaned down to 4 L-pulse ox is 96%  Schizophrenia -depression/anxiety -Continue Wellbutrin trazodone Abilify and Seroquel  Hypertension - Losartan started -She states that Lasix was discontinued by her cardiologist-her last echo does not show that she has any congestive heart failure and therefore I have not resumed her Lasix  Nicotine abuse She has been advised to stop smoking  Discharge Exam: Vitals:   03/01/19 1217 03/01/19 1300  BP: 115/87 (!) 156/93  Pulse: 89 (!) 107  Resp: (!) 21 17  Temp:    SpO2: 97% 96%   Vitals:   03/01/19 1000 03/01/19 1200 03/01/19 1217 03/01/19 1300  BP: (!) 148/86 (!) 148/103 115/87 (!) 156/93  Pulse: 96 93 89 (!) 107  Resp:  17 (!) 21 17  Temp:  99.3 F (37.4 C)    TempSrc:  Axillary    SpO2:  95% 97% 96%  Weight:      Height:        General: Pt is alert, awake, not in acute distress Cardiovascular: RRR, S1/S2 +, no rubs, no gallops Respiratory: CTA bilaterally, no wheezing, no rhonchi Abdominal: Soft, NT, ND, bowel sounds + Extremities: no edema, no cyanosis   Discharge Instructions  Discharge Instructions    Diet - low sodium heart healthy   Complete by: As directed    Increase activity  slowly   Complete by: As directed      Allergies as of 03/01/2019      Reactions   Unasyn [ampicillin-sulbactam Sodium] Swelling, Rash   angioedema   Contrast Media [iodinated Diagnostic Agents] Nausea And Vomiting   Hydralazine Swelling      Medication List    TAKE these medications   albuterol 108 (90 Base)  MCG/ACT inhaler Commonly known as: VENTOLIN HFA Inhale 2 puffs into the lungs every 6 (six) hours as needed for wheezing or shortness of breath.   albuterol (2.5 MG/3ML) 0.083% nebulizer solution Commonly known as: PROVENTIL Take 3 mLs (2.5 mg total) by nebulization every 6 (six) hours as needed for wheezing or shortness of breath.   amLODipine 5 MG tablet Commonly known as: NORVASC Take 1 tablet (5 mg total) by mouth daily.   ARIPiprazole 5 MG tablet Commonly known as: ABILIFY Take 5 mg by mouth daily.   budesonide-formoterol 160-4.5 MCG/ACT inhaler Commonly known as: Symbicort INHALE TWO puffs into THE lungs TWICE DAILY What changed:   how much to take  how to take this  when to take this  additional instructions   buPROPion 150 MG 12 hr tablet Commonly known as: WELLBUTRIN SR Take 150 mg by mouth daily.   diphenhydrAMINE 25 mg capsule Commonly known as: BENADRYL Take 1 capsule (25 mg total) by mouth every 6 (six) hours as needed for itching.   famotidine 20 MG tablet Commonly known as: PEPCID Take 1 tablet (20 mg total) by mouth at bedtime.   gabapentin 300 MG capsule Commonly known as: NEURONTIN Take 300 mg by mouth 3 (three) times daily.   ibuprofen 800 MG tablet Commonly known as: ADVIL Take 400 mg by mouth every 8 (eight) hours as needed (pain).   losartan 50 MG tablet Commonly known as: COZAAR Take 1 tablet (50 mg total) by mouth daily. Start taking on: March 02, 2019   multivitamin with minerals Tabs tablet Take 1 tablet by mouth daily. Start taking on: March 02, 2019   naproxen 375 MG tablet Commonly known as: NAPROSYN Take 1 tablet (375 mg total) by mouth 2 (two) times daily.   nicotine 7 mg/24hr patch Commonly known as: NICODERM CQ - dosed in mg/24 hr Place 7 mg onto the skin daily as needed (smoking cessation).   nicotine polacrilex 2 MG gum Commonly known as: NICORETTE Take 2 mg by mouth as needed for smoking cessation.    OXYGEN Inhale 4 L into the lungs continuous.   predniSONE 20 MG tablet Commonly known as: DELTASONE 40 mg daily x7 days followed by 20 mg daily x7 days followed by 10 mg daily What changed:   medication strength  additional instructions   QUEtiapine 100 MG tablet Commonly known as: SEROQUEL Take 100 mg by mouth every morning. What changed: Another medication with the same name was removed. Continue taking this medication, and follow the directions you see here.   QUEtiapine 200 MG tablet Commonly known as: SEROQUEL Take 200 mg by mouth at bedtime. What changed: Another medication with the same name was removed. Continue taking this medication, and follow the directions you see here.   rosuvastatin 10 MG tablet Commonly known as: CRESTOR Take 1 tablet (10 mg total) by mouth daily.   traZODone 100 MG tablet Commonly known as: DESYREL Take 100 mg by mouth at bedtime.       Allergies  Allergen Reactions  . Unasyn [Ampicillin-Sulbactam Sodium] Swelling and Rash    angioedema  .  Contrast Media [Iodinated Diagnostic Agents] Nausea And Vomiting  . Hydralazine Swelling     Procedures/Studies:   Ct Chest High Resolution  Result Date: 02/22/2019 CLINICAL DATA:  Evaluate for interstitial lung disease, smoker, chronic shortness of breath EXAM: CT CHEST WITHOUT CONTRAST TECHNIQUE: Multidetector CT imaging of the chest was performed following the standard protocol without intravenous contrast. High resolution imaging of the lungs, as well as inspiratory and expiratory imaging, was performed. COMPARISON:  03/23/2017, 03/24/2016 FINDINGS: Cardiovascular: No significant vascular findings. Cardiomegaly. Trace pericardial effusion. Mediastinum/Nodes: No enlarged mediastinal, hilar, or axillary lymph nodes. Thyroid gland, trachea, and esophagus demonstrate no significant findings. Lungs/Pleura: Examination is somewhat limited by body habitus, underpenetration, and breath motion artifact.  Bandlike scarring or atelectasis of the medial right middle lobe and lingula. Mild centrilobular emphysema. Diffuse ground-glass opacity. Mild geographic attenuation of the airspaces, not clearly increased on expiratory phase imaging. Evidence of a prior wedge biopsy of the left lower lobe. No pleural effusion or pneumothorax. Upper Abdomen: No acute abnormality. Musculoskeletal: No chest wall mass or suspicious bone lesions identified. IMPRESSION: 1. Examination is somewhat limited by body habitus, underpenetration, and breath motion artifact. Bandlike scarring or atelectasis of the medial right middle lobe and lingula, likely compressive due to cardiomegaly. Mild centrilobular emphysema and diffuse ground-glass opacity, the appearance of the latter possibly exaggerated by technical factors of perhaps reflecting smoking-related respiratory bronchiolitis. No evidence of fibrotic interstitial lung disease. 2. Mild geographic attenuation of the airspaces, not clearly increased on expiratory phase imaging although nonetheless suggestive of small airways disease. 3.  Cardiomegaly and trace pericardial effusion. Electronically Signed   By: Eddie Candle M.D.   On: 02/22/2019 15:57   Dg Chest Port 1 View  Result Date: 02/28/2019 CLINICAL DATA:  Pulmonary edema. EXAM: PORTABLE CHEST 1 VIEW COMPARISON:  View chest x-ray 02/26/2019.  CT chest 02/22/2019 FINDINGS: The heart is enlarged. Interstitial and airspace opacities demonstrates slight increase since the prior exam. Left greater than right basilar airspace opacities are present. Left pleural effusion is suspected. IMPRESSION: 1. Cardiomegaly and increasing interstitial edema consistent with congestive heart failure. 2. Left greater than right pleural effusions and associated airspace disease. While this likely reflects atelectasis, infection is not excluded. Electronically Signed   By: San Morelle M.D.   On: 02/28/2019 07:05   Dg Chest Port 1 View  Result  Date: 02/26/2019 CLINICAL DATA:  Shortness of breath. EXAM: PORTABLE CHEST 1 VIEW COMPARISON:  August 02, 2018 FINDINGS: Stable cardiomegaly. The hila and mediastinum are unchanged. No pneumothorax. Increased interstitial opacities are identified. IMPRESSION: Increased interstitial opacities may be due to mild edema versus atypical infection. Electronically Signed   By: Dorise Bullion III M.D   On: 02/26/2019 20:16   Dg Esophagus W Single Cm (sol Or Thin Ba)  Result Date: 02/28/2019 CLINICAL DATA:  Difficulty swallowing solid foods. EXAM: ESOPHOGRAM/BARIUM SWALLOW TECHNIQUE: Single contrast examination was performed using  thin barium. FLUOROSCOPY TIME:  Fluoroscopy Time:  1 minutes 6 seconds. Radiation Exposure Index (if provided by the fluoroscopic device): 18.8 mGy Number of Acquired Spot Images: 0 COMPARISON:  None FINDINGS: Examination was performed in a modified fashion with the patient in the semi upright LPO orientation. Initially the patient ingested thin barium. The esophagus is patent. There is no stricture or mass identified. No significant motility abnormality identified. The patient then ingested a 13 mm barium tablet which rapidly passed through the esophagus and in the stomach. IMPRESSION: 1. Patent esophagus. No stricture or mass. No finding to  explain patient's difficulty swallowing. Electronically Signed   By: Kerby Moors M.D.   On: 02/28/2019 14:06     The results of significant diagnostics from this hospitalization (including imaging, microbiology, ancillary and laboratory) are listed below for reference.     Microbiology: Recent Results (from the past 240 hour(s))  SARS Coronavirus 2 [facility] order, Performed in [facility] hospital lab) Nasopharyngeal Nasopharyngeal Swab     Status: None   Collection Time: 02/26/19  9:32 PM   Specimen: Nasopharyngeal Swab  Result Value Ref Range Status   SARS Coronavirus 2 NEGATIVE NEGATIVE Final    Comment: (NOTE) If result is  NEGATIVE SARS-CoV-2 target nucleic acids are NOT DETECTED. The SARS-CoV-2 RNA is generally detectable in upper and lower  respiratory specimens during the acute phase of infection. The lowest  concentration of SARS-CoV-2 viral copies this assay can detect is 250  copies / mL. A negative result does not preclude SARS-CoV-2 infection  and should not be used as the sole basis for treatment or other  patient management decisions.  A negative result may occur with  improper specimen collection / handling, submission of specimen other  than nasopharyngeal swab, presence of viral mutation(s) within the  areas targeted by this assay, and inadequate number of viral copies  (<250 copies / mL). A negative result must be combined with clinical  observations, patient history, and epidemiological information. If result is POSITIVE SARS-CoV-2 target nucleic acids are DETECTED. The SARS-CoV-2 RNA is generally detectable in upper and lower  respiratory specimens dur ing the acute phase of infection.  Positive  results are indicative of active infection with SARS-CoV-2.  Clinical  correlation with patient history and other diagnostic information is  necessary to determine patient infection status.  Positive results do  not rule out bacterial infection or co-infection with other viruses. If result is PRESUMPTIVE POSTIVE SARS-CoV-2 nucleic acids MAY BE PRESENT.   A presumptive positive result was obtained on the submitted specimen  and confirmed on repeat testing.  While 2019 novel coronavirus  (SARS-CoV-2) nucleic acids may be present in the submitted sample  additional confirmatory testing may be necessary for epidemiological  and / or clinical management purposes  to differentiate between  SARS-CoV-2 and other Sarbecovirus currently known to infect humans.  If clinically indicated additional testing with an alternate test  methodology [phone]) is advised. The SARS-CoV-2 RNA is generally  detectable  in upper and lower respiratory sp ecimens during the acute  phase of infection. The expected result is Negative. Fact Sheet for Patients:  StrictlyIdeas.no Fact Sheet for Healthcare Providers: BankingDealers.co.za This test is not yet approved or cleared by the [place] FDA and has been authorized for detection and/or diagnosis of SARS-CoV-2 by FDA under an Emergency Use Authorization (EUA).  This EUA will remain in effect (meaning this test can be used) for the duration of the COVID-19 declaration under Section 564(b)(1) of the Act, 21 U.S.C. section 360bbb-3(b)(1), unless the authorization is terminated or revoked sooner. Performed at [facility], [place] [address], [place], [place] [address]   MRSA PCR Screening     Status: None   Collection Time: 02/27/19  1:50 AM   Specimen: Nasopharyngeal  Result Value Ref Range Status   MRSA by PCR NEGATIVE NEGATIVE Final    Comment:        The GeneXpert MRSA Assay (FDA approved for NASAL specimens only), is one component of a comprehensive MRSA colonization surveillance program. It is not intended to diagnose  MRSA infection nor to guide or monitor treatment for MRSA infections. Performed at [facility], [place] [address] [place], [place] [address]      Labs: BNP (last 3 results) Recent Labs    08/02/18 1557 02/26/19 2110  BNP 319.7* 881.1*   Basic Metabolic Panel: Recent Labs  Lab 02/26/19 2110 02/27/19 0237  NA 140 141  K 4.5 4.2  CL 93* 91*  CO2 35* 37*  GLUCOSE 102* 109*  BUN 9 9  CREATININE 0.65 0.71  CALCIUM 9.0 9.2   Liver Function Tests: Recent Labs  Lab 02/27/19 0237  AST 26  ALT 28  ALKPHOS 67  BILITOT 1.0  PROT 7.3  ALBUMIN 3.8   No results for input(s): LIPASE, AMYLASE in the last 168 hours. No results for input(s): AMMONIA in the last 168 hours. CBC: Recent Labs  Lab 02/26/19 2110 02/27/19 0237  WBC 9.1 6.0  NEUTROABS 4.2  --    HGB 17.3* 18.0*  HCT 65.1* 66.1*  MCV 84.4 83.0  PLT 238 262   Cardiac Enzymes: No results for input(s): CKTOTAL, CKMB, CKMBINDEX, TROPONINI in the last 168 hours. BNP: Invalid input(s): POCBNP CBG: Recent Labs  Lab 02/27/19 1618  GLUCAP 129*   D-Dimer No results for input(s): DDIMER in the last 72 hours. Hgb A1c No results for input(s): HGBA1C in the last 72 hours. Lipid Profile No results for input(s): CHOL, HDL, LDLCALC, TRIG, CHOLHDL, LDLDIRECT in the last 72 hours. Thyroid function studies No results for input(s): TSH, T4TOTAL, T3FREE, THYROIDAB in the last 72 hours.  Invalid input(s): FREET3 Anemia work up No results for input(s): VITAMINB12, FOLATE, FERRITIN, TIBC, IRON, RETICCTPCT in the last 72 hours. Urinalysis    Component Value Date/Time   COLORURINE YELLOW 08/02/2018 1745   APPEARANCEUR CLOUDY (A) 08/02/2018 1745   LABSPEC 1.019 08/02/2018 1745   PHURINE 5.0 08/02/2018 1745   GLUCOSEU NEGATIVE 08/02/2018 1745   HGBUR SMALL (A) 08/02/2018 1745   BILIRUBINUR NEGATIVE 08/02/2018 1745   KETONESUR 5 (A) 08/02/2018 1745   PROTEINUR 100 (A) 08/02/2018 1745   UROBILINOGEN 0.2 07/13/2014 1122   NITRITE NEGATIVE 08/02/2018 1745   LEUKOCYTESUR NEGATIVE 08/02/2018 1745   Sepsis Labs Invalid input(s): PROCALCITONIN,  WBC,  LACTICIDVEN Microbiology Recent Results (from the past 240 hour(s))  SARS Coronavirus 2 [facility] order, Performed in [facility] hospital lab) Nasopharyngeal Nasopharyngeal Swab     Status: None   Collection Time: 02/26/19  9:32 PM   Specimen: Nasopharyngeal Swab  Result Value Ref Range Status   SARS Coronavirus 2 NEGATIVE NEGATIVE Final    Comment: (NOTE) If result is NEGATIVE SARS-CoV-2 target nucleic acids are NOT DETECTED. The SARS-CoV-2 RNA is generally detectable in upper and lower  respiratory specimens during the acute phase of infection. The lowest  concentration of SARS-CoV-2 viral copies this assay can detect is 250  copies /  mL. A negative result does not preclude SARS-CoV-2 infection  and should not be used as the sole basis for treatment or other  patient management decisions.  A negative result may occur with  improper specimen collection / handling, submission of specimen other  than nasopharyngeal swab, presence of viral mutation(s) within the  areas targeted by this assay, and inadequate number of viral copies  (<250 copies / mL). A negative result must be combined with clinical  observations, patient history, and epidemiological information. If result is POSITIVE SARS-CoV-2 target nucleic acids are DETECTED. The SARS-CoV-2 RNA is generally detectable in upper and lower  respiratory specimens dur ing the acute phase of infection.  Positive  results are indicative of active infection with SARS-CoV-2.  Clinical  correlation with patient history and other diagnostic information is  necessary to determine patient infection status.  Positive results do  not rule out bacterial infection or co-infection with other viruses. If result is PRESUMPTIVE POSTIVE SARS-CoV-2 nucleic acids MAY BE PRESENT.   A presumptive positive result was obtained on the submitted specimen  and confirmed on repeat testing.  While 2019 novel coronavirus  (SARS-CoV-2) nucleic acids may be present in the submitted sample  additional confirmatory testing may be necessary for epidemiological  and / or clinical management purposes  to differentiate between  SARS-CoV-2 and other Sarbecovirus currently known to infect humans.  If clinically indicated additional testing with an alternate test  methodology [phone]) is advised. The SARS-CoV-2 RNA is generally  detectable in upper and lower respiratory sp ecimens during the acute  phase of infection. The expected result is Negative. Fact Sheet for Patients:  StrictlyIdeas.no Fact Sheet for Healthcare Providers: BankingDealers.co.za This test is  not yet approved or cleared by the [place] FDA and has been authorized for detection and/or diagnosis of SARS-CoV-2 by FDA under an Emergency Use Authorization (EUA).  This EUA will remain in effect (meaning this test can be used) for the duration of the COVID-19 declaration under Section 564(b)(1) of the Act, 21 U.S.C. section 360bbb-3(b)(1), unless the authorization is terminated or revoked sooner. Performed at [facility], [place] [address], [place], [place] [address]   MRSA PCR Screening     Status: None   Collection Time: 02/27/19  1:50 AM   Specimen: Nasopharyngeal  Result Value Ref Range Status   MRSA by PCR NEGATIVE NEGATIVE Final    Comment:        The GeneXpert MRSA Assay (FDA approved for NASAL specimens only), is one component of a comprehensive MRSA colonization surveillance program. It is not intended to diagnose MRSA infection nor to guide or monitor treatment for MRSA infections. Performed at [facility], [place] [address], [place], [place] [address]      Time coordinating discharge in minutes: 26  SIGNED:   Debbe Odea, MD  [facility] 03/01/2019, 3:02 PM Pager   If 7PM-7AM, please contact night-coverage [email or web address] Password [email or web address]

## 2019-03-01 NOTE — Telephone Encounter (Signed)
Message sent from triage box to left messages for follow up for scheduling OV post-hospitalization discharge.

## 2019-03-01 NOTE — Evaluation (Signed)
Occupational Therapy Evaluation Patient Details Name: Diane Gilbert MRN: [id number] DOB: [birth year] Today's Date: 03/01/2019    History of Present Illness 53 year old female with OHS and possible interstitial lung disease on home O2 presenting with acute on chronic hypoxemic and hypercapneic respiratory failure.  Past medical history of Acute respiratory failure with hypoxia, Alcohol abuse, Anxiety, Arthritis, CHF, Community acquired pneumonia, Depression, Headache, Hypertension, Insomnia, Interstitial lung disease, Oxygen dependent, Respiratory failure, acute and chronic, Schizophrenia, Shortness of breath dyspnea, and Sleep apnea.   Clinical Impression   Pt reports she walks with a walker and is dependent in bathing and dressing on her aide. Pt sleepy, reports she does not typically get OOB until noon when her aide arrives. Pt currently requires set up to max assist for ADL. Will work towards independence in toileting, standing activities and UB strengthening acutely. Pt plans to resume OPPT discharge.    Follow Up Recommendations  No OT follow up    Equipment Recommendations  None recommended by OT    Recommendations for Other Services       Precautions / Restrictions Precautions Precautions: Fall Precaution Comments: reports fall as recent as 2 weeks ago, feels her Gabapentin plays a role Restrictions Weight Bearing Restrictions: No      Mobility Bed Mobility Overal bed mobility: Needs Assistance Bed Mobility: Supine to Sit;Sit to Supine     Supine to sit: Supervision;HOB elevated Sit to supine: Min assist   General bed mobility comments: min assist for LEs back into bed, increased time, assist to manage multiple lines  Transfers Overall transfer level: Needs assistance Equipment used: Rolling walker (2 wheeled) Transfers: Sit to/from [facility] Sit to Stand: Min assist;Min guard         General transfer comment: steadying assist     Balance Overall balance assessment: Needs assistance   Sitting balance-Leahy Scale: Good       Standing balance-Leahy Scale: Fair                             ADL either performed or assessed with clinical judgement   ADL Overall ADL's : Needs assistance/impaired Eating/Feeding: Independent Eating/Feeding Details (indicate cue type and reason): able to open containers independently Grooming: Wash/dry hands;Wash/dry face;Sitting;Set up                   Toilet Transfer: Stand-pivot;RW;Min guard;Minimal assistance;Regular Toilet;Grab bars   Toileting- Clothing Manipulation and Hygiene: Sit to/from stand;Min guard       Functional mobility during ADLs: Min guard;Rolling walker       Vision Patient Visual Report: No change from baseline       Perception     Praxis      Pertinent Vitals/Pain Pain Assessment: Faces Faces Pain Scale: Hurts little more Pain Location: feet Pain Descriptors / Indicators: Tingling Pain Intervention(s): Monitored during session;Repositioned     Hand Dominance Right   Extremity/Trunk Assessment Upper Extremity Assessment Upper Extremity Assessment: Overall WFL for tasks assessed;Generalized weakness   Lower Extremity Assessment Lower Extremity Assessment: Defer to PT evaluation       Communication Communication Communication: No difficulties   Cognition Arousal/Alertness: Awake/alert Behavior During Therapy: Flat affect Overall Cognitive Status: No family/caregiver present to determine baseline cognitive functioning                                 General Comments:  slow response speed    General Comments       Exercises     Shoulder Instructions      Home Living Family/patient expects to be discharged to:: Private residence Living Arrangements: Non-relatives/Friends(roommate) Available Help at Discharge: Personal care attendant(2 hours/7 days a week) Type of Home: Apartment Home Access:  Stairs to enter [employer] of Steps: 1   Home Layout: One level     Bathroom Shower/Tub: Teacher, early years/pre: Standard     Home Equipment: Environmental consultant - 2 wheels;Bedside commode;Shower seat;Other (comment)(02)   Additional Comments: would like a 4 wheeled walker      Prior Functioning/Environment Level of Independence: Needs assistance  Gait / Transfers Assistance Needed: walks with a RW ADL's / Homemaking Assistance Needed: reports her aide bathes and dresses her and performs housekeeping, pt can take herself to the bathroom   Comments: uses 4L O2 at home        OT Problem List: Impaired balance (sitting and/or standing);Cardiopulmonary status limiting activity;Pain;Decreased activity tolerance;Decreased strength      OT Treatment/Interventions: Self-care/ADL training;DME and/or AE instruction;Patient/family education;Balance training;Therapeutic activities;Therapeutic exercise    OT Goals(Current goals can be found in the care plan section) Acute Rehab OT Goals Patient Stated Goal: to go back to PT OT Goal Formulation: With patient Time For Goal Achievement: 03/15/19 Potential to Achieve Goals: Good  OT Frequency: Min 2X/week   Barriers to D/C:            Co-evaluation              AM-PAC OT "6 Clicks" Daily Activity     Outcome Measure Help from another person eating meals?: None Help from another person taking care of personal grooming?: A Little Help from another person toileting, which includes using toliet, bedpan, or urinal?: A Little Help from another person bathing (including washing, rinsing, drying)?: A Lot Help from another person to put on and taking off regular upper body clothing?: A Little Help from another person to put on and taking off regular lower body clothing?: A Lot 6 Click Score: 17   End of Session Equipment Utilized During Treatment: Gait belt;Rolling walker;Oxygen(4L)  Activity Tolerance: Patient tolerated  treatment well Patient left: in bed;with call bell/phone within reach  OT Visit Diagnosis: Pain;Muscle weakness (generalized) (M62.81);Other abnormalities of gait and mobility (R26.89)(decreased activity tolerance)                Time: 4709-6283 OT Time Calculation (min): 17 min Charges:  OT General Charges $OT Visit: 1 Visit OT Evaluation $OT Eval Moderate Complexity: 1 Mod  Nestor Lewandowsky, OTR/L Acute Rehabilitation Services Pager: [phone] Office: [phone]  Diane Gilbert 03/01/2019, 9:08 AM

## 2019-03-07 ENCOUNTER — Encounter (HOSPITAL_COMMUNITY): Payer: Self-pay

## 2019-03-07 ENCOUNTER — Other Ambulatory Visit: Payer: Self-pay

## 2019-03-07 ENCOUNTER — Emergency Department (HOSPITAL_COMMUNITY): Payer: Medicaid Other

## 2019-03-07 ENCOUNTER — Inpatient Hospital Stay (HOSPITAL_COMMUNITY)
Admission: EM | Admit: 2019-03-07 | Discharge: 2019-03-12 | DRG: 917 | Disposition: A | Discharge status: Home / Self Care | Payer: Medicaid Other | Attending: Internal Medicine | Admitting: Internal Medicine

## 2019-03-07 DIAGNOSIS — F203 Undifferentiated schizophrenia: Secondary | ICD-10-CM

## 2019-03-07 DIAGNOSIS — F40240 Claustrophobia: Secondary | ICD-10-CM | POA: Diagnosis present

## 2019-03-07 DIAGNOSIS — Z803 Family history of malignant neoplasm of breast: Secondary | ICD-10-CM

## 2019-03-07 DIAGNOSIS — Z20828 Contact with and (suspected) exposure to other viral communicable diseases: Secondary | ICD-10-CM | POA: Diagnosis present

## 2019-03-07 DIAGNOSIS — M549 Dorsalgia, unspecified: Secondary | ICD-10-CM | POA: Diagnosis present

## 2019-03-07 DIAGNOSIS — F209 Schizophrenia, unspecified: Secondary | ICD-10-CM | POA: Diagnosis present

## 2019-03-07 DIAGNOSIS — G9341 Metabolic encephalopathy: Secondary | ICD-10-CM | POA: Diagnosis present

## 2019-03-07 DIAGNOSIS — Z91041 Radiographic dye allergy status: Secondary | ICD-10-CM

## 2019-03-07 DIAGNOSIS — G9340 Encephalopathy, unspecified: Secondary | ICD-10-CM | POA: Diagnosis not present

## 2019-03-07 DIAGNOSIS — J9622 Acute and chronic respiratory failure with hypercapnia: Secondary | ICD-10-CM

## 2019-03-07 DIAGNOSIS — K5900 Constipation, unspecified: Secondary | ICD-10-CM | POA: Diagnosis not present

## 2019-03-07 DIAGNOSIS — E785 Hyperlipidemia, unspecified: Secondary | ICD-10-CM | POA: Diagnosis present

## 2019-03-07 DIAGNOSIS — M1990 Unspecified osteoarthritis, unspecified site: Secondary | ICD-10-CM | POA: Diagnosis present

## 2019-03-07 DIAGNOSIS — Z825 Family history of asthma and other chronic lower respiratory diseases: Secondary | ICD-10-CM

## 2019-03-07 DIAGNOSIS — I5033 Acute on chronic diastolic (congestive) heart failure: Secondary | ICD-10-CM

## 2019-03-07 DIAGNOSIS — J84115 Respiratory bronchiolitis interstitial lung disease: Secondary | ICD-10-CM | POA: Diagnosis present

## 2019-03-07 DIAGNOSIS — I110 Hypertensive heart disease with heart failure: Secondary | ICD-10-CM | POA: Diagnosis present

## 2019-03-07 DIAGNOSIS — R0602 Shortness of breath: Secondary | ICD-10-CM

## 2019-03-07 DIAGNOSIS — J9621 Acute and chronic respiratory failure with hypoxia: Secondary | ICD-10-CM

## 2019-03-07 DIAGNOSIS — I639 Cerebral infarction, unspecified: Secondary | ICD-10-CM

## 2019-03-07 DIAGNOSIS — T380X1A Poisoning by glucocorticoids and synthetic analogues, accidental (unintentional), initial encounter: Secondary | ICD-10-CM | POA: Diagnosis present

## 2019-03-07 DIAGNOSIS — Z23 Encounter for immunization: Secondary | ICD-10-CM

## 2019-03-07 DIAGNOSIS — J849 Interstitial pulmonary disease, unspecified: Secondary | ICD-10-CM

## 2019-03-07 DIAGNOSIS — T43211A Poisoning by selective serotonin and norepinephrine reuptake inhibitors, accidental (unintentional), initial encounter: Principal | ICD-10-CM | POA: Diagnosis present

## 2019-03-07 DIAGNOSIS — Z8673 Personal history of transient ischemic attack (TIA), and cerebral infarction without residual deficits: Secondary | ICD-10-CM

## 2019-03-07 DIAGNOSIS — R930 Abnormal findings on diagnostic imaging of skull and head, not elsewhere classified: Secondary | ICD-10-CM

## 2019-03-07 DIAGNOSIS — T43591A Poisoning by other antipsychotics and neuroleptics, accidental (unintentional), initial encounter: Secondary | ICD-10-CM | POA: Diagnosis present

## 2019-03-07 DIAGNOSIS — Z9981 Dependence on supplemental oxygen: Secondary | ICD-10-CM

## 2019-03-07 DIAGNOSIS — G4733 Obstructive sleep apnea (adult) (pediatric): Secondary | ICD-10-CM | POA: Diagnosis present

## 2019-03-07 DIAGNOSIS — I5031 Acute diastolic (congestive) heart failure: Secondary | ICD-10-CM | POA: Diagnosis present

## 2019-03-07 DIAGNOSIS — Z7951 Long term (current) use of inhaled steroids: Secondary | ICD-10-CM

## 2019-03-07 DIAGNOSIS — G8929 Other chronic pain: Secondary | ICD-10-CM | POA: Diagnosis present

## 2019-03-07 DIAGNOSIS — F17210 Nicotine dependence, cigarettes, uncomplicated: Secondary | ICD-10-CM | POA: Diagnosis present

## 2019-03-07 DIAGNOSIS — Z888 Allergy status to other drugs, medicaments and biological substances status: Secondary | ICD-10-CM

## 2019-03-07 LAB — CBC WITH DIFFERENTIAL/PLATELET
Abs Immature Granulocytes: 0.05 10*3/uL (ref 0.00–0.07)
Basophils Absolute: 0 10*3/uL (ref 0.0–0.1)
Basophils Relative: 0 %
Eosinophils Absolute: 0.1 10*3/uL (ref 0.0–0.5)
Eosinophils Relative: 1 %
HCT: 63.8 % — ABNORMAL HIGH (ref 36.0–46.0)
Hemoglobin: 16.4 g/dL — ABNORMAL HIGH (ref 12.0–15.0)
Immature Granulocytes: 1 %
Lymphocytes Relative: 18 %
Lymphs Abs: 1.8 10*3/uL (ref 0.7–4.0)
MCH: 23 pg — ABNORMAL LOW (ref 26.0–34.0)
MCHC: 25.7 g/dL — ABNORMAL LOW (ref 30.0–36.0)
MCV: 89.4 fL (ref 80.0–100.0)
Monocytes Absolute: 1 10*3/uL (ref 0.1–1.0)
Monocytes Relative: 10 %
Neutro Abs: 6.7 10*3/uL (ref 1.7–7.7)
Neutrophils Relative %: 70 %
Platelets: 197 10*3/uL (ref 150–400)
RBC: 7.14 MIL/uL — ABNORMAL HIGH (ref 3.87–5.11)
RDW: 22.2 % — ABNORMAL HIGH (ref 11.5–15.5)
WBC: 9.6 10*3/uL (ref 4.0–10.5)
nRBC: 0 % (ref 0.0–0.2)

## 2019-03-07 LAB — BLOOD GAS, VENOUS
Acid-Base Excess: 16.1 mmol/L — ABNORMAL HIGH (ref 0.0–2.0)
Acid-Base Excess: 15.3 mmol/L — ABNORMAL HIGH (ref 0.0–2.0)
Bicarbonate: 49.4 mmol/L — ABNORMAL HIGH (ref 20.0–28.0)
Bicarbonate: 48.4 mmol/L — ABNORMAL HIGH (ref 20.0–28.0)
Drawn by: 225631
O2 Saturation: 75.8 %
O2 Saturation: 87.6 %
Patient temperature: 37
Patient temperature: 99.8
pCO2, Ven: 105 mmHg (ref 44.0–60.0)
pCO2, Ven: 106 mmHg (ref 44.0–60.0)
pH, Ven: 7.29 (ref 7.250–7.430)
pH, Ven: 7.286 (ref 7.250–7.430)
pO2, Ven: 42.5 mmHg (ref 32.0–45.0)
pO2, Ven: 60.7 mmHg — ABNORMAL HIGH (ref 32.0–45.0)

## 2019-03-07 LAB — COMPREHENSIVE METABOLIC PANEL
ALT: 23 U/L (ref 0–44)
AST: 17 U/L (ref 15–41)
Albumin: 3.2 g/dL — ABNORMAL LOW (ref 3.5–5.0)
Alkaline Phosphatase: 60 U/L (ref 38–126)
Anion gap: 10 (ref 5–15)
BUN: 8 mg/dL (ref 6–20)
CO2: 41 mmol/L — ABNORMAL HIGH (ref 22–32)
Calcium: 8.6 mg/dL — ABNORMAL LOW (ref 8.9–10.3)
Chloride: 93 mmol/L — ABNORMAL LOW (ref 98–111)
Creatinine, Ser: 0.62 mg/dL (ref 0.44–1.00)
GFR calc Af Amer: >60 mL/min (ref >60)
GFR calc non Af Amer: >60 mL/min (ref >60)
Glucose, Bld: 136 mg/dL — ABNORMAL HIGH (ref 70–99)
Potassium: 4 mmol/L (ref 3.5–5.1)
Sodium: 144 mmol/L (ref 135–145)
Total Bilirubin: 0.7 mg/dL (ref 0.3–1.2)
Total Protein: 6.2 g/dL — ABNORMAL LOW (ref 6.5–8.1)

## 2019-03-07 LAB — ACETAMINOPHEN LEVEL: Acetaminophen (Tylenol), Serum: <10 ug/mL — ABNORMAL LOW (ref 10–30)

## 2019-03-07 LAB — SALICYLATE LEVEL: Salicylate Lvl: <7 mg/dL (ref 2.8–30.0)

## 2019-03-07 LAB — BRAIN NATRIURETIC PEPTIDE: B Natriuretic Peptide: 79.9 pg/mL (ref 0.0–100.0)

## 2019-03-07 LAB — SARS CORONAVIRUS 2 BY RT PCR (HOSPITAL ORDER, PERFORMED IN ~~LOC~~ HOSPITAL LAB): SARS Coronavirus 2: NEGATIVE

## 2019-03-07 MED ORDER — QUETIAPINE FUMARATE 200 MG PO TABS
200.0000 mg | ORAL_TABLET | Freq: Every day | ORAL | Status: DC
  Administered 2019-03-08 – 2019-03-11 (×5): 200 mg via ORAL
  Filled 2019-03-07: qty 2
  Filled 2019-03-07 (×3): qty 1
  Filled 2019-03-07: qty 2

## 2019-03-07 MED ORDER — NICOTINE POLACRILEX 2 MG MT GUM
2.0000 mg | CHEWING_GUM | OROMUCOSAL | Status: DC | PRN
  Filled 2019-03-07: qty 1

## 2019-03-07 MED ORDER — FUROSEMIDE 10 MG/ML IJ SOLN
40.0000 mg | Freq: Two times a day (BID) | INTRAMUSCULAR | Status: DC
  Administered 2019-03-07 – 2019-03-09 (×4): 40 mg via INTRAVENOUS
  Filled 2019-03-07 (×4): qty 4

## 2019-03-07 MED ORDER — ENOXAPARIN SODIUM 40 MG/0.4ML ~~LOC~~ SOLN
40.0000 mg | Freq: Every day | SUBCUTANEOUS | Status: DC
  Administered 2019-03-08 – 2019-03-11 (×5): 40 mg via SUBCUTANEOUS
  Filled 2019-03-07 (×5): qty 0.4

## 2019-03-07 MED ORDER — GABAPENTIN 300 MG PO CAPS
300.0000 mg | ORAL_CAPSULE | Freq: Three times a day (TID) | ORAL | Status: DC
  Administered 2019-03-08 – 2019-03-12 (×13): 300 mg via ORAL
  Filled 2019-03-07 (×13): qty 1

## 2019-03-07 MED ORDER — MOMETASONE FURO-FORMOTEROL FUM 200-5 MCG/ACT IN AERO
2.0000 | INHALATION_SPRAY | Freq: Two times a day (BID) | RESPIRATORY_TRACT | Status: DC
  Administered 2019-03-08 – 2019-03-12 (×10): 2 via RESPIRATORY_TRACT
  Filled 2019-03-07: qty 8.8

## 2019-03-07 MED ORDER — PREDNISONE 10 MG PO TABS: 10.0000 mg | ORAL_TABLET | Freq: Every day | ORAL | Status: DC

## 2019-03-07 MED ORDER — ONDANSETRON HCL 4 MG PO TABS: 4.0000 mg | ORAL_TABLET | Freq: Four times a day (QID) | ORAL | Status: DC | PRN

## 2019-03-07 MED ORDER — SODIUM CHLORIDE 0.9% FLUSH
3.0000 mL | Freq: Two times a day (BID) | INTRAVENOUS | Status: DC
  Administered 2019-03-08 – 2019-03-11 (×10): 3 mL via INTRAVENOUS

## 2019-03-07 MED ORDER — ACETAMINOPHEN 650 MG RE SUPP: 650.0000 mg | Freq: Four times a day (QID) | RECTAL | Status: DC | PRN

## 2019-03-07 MED ORDER — FAMOTIDINE 20 MG PO TABS
20.0000 mg | ORAL_TABLET | Freq: Every day | ORAL | Status: DC
  Administered 2019-03-08 – 2019-03-11 (×5): 20 mg via ORAL
  Filled 2019-03-07 (×5): qty 1

## 2019-03-07 MED ORDER — ALBUTEROL SULFATE (2.5 MG/3ML) 0.083% IN NEBU
2.5000 mg | INHALATION_SOLUTION | Freq: Four times a day (QID) | RESPIRATORY_TRACT | Status: DC | PRN
  Administered 2019-03-08: 21:00:00 2.5 mg via RESPIRATORY_TRACT
  Filled 2019-03-07: qty 3

## 2019-03-07 MED ORDER — ROSUVASTATIN CALCIUM 10 MG PO TABS
10.0000 mg | ORAL_TABLET | Freq: Every day | ORAL | Status: DC
  Administered 2019-03-08 – 2019-03-11 (×4): 10 mg via ORAL
  Filled 2019-03-07 (×4): qty 1

## 2019-03-07 MED ORDER — LOSARTAN POTASSIUM 50 MG PO TABS
50.0000 mg | ORAL_TABLET | Freq: Every day | ORAL | Status: DC
  Administered 2019-03-08 – 2019-03-12 (×5): 50 mg via ORAL
  Filled 2019-03-07 (×5): qty 1

## 2019-03-07 MED ORDER — POLYETHYLENE GLYCOL 3350 17 G PO PACK
17.0000 g | PACK | Freq: Every day | ORAL | Status: DC | PRN
  Administered 2019-03-11: 17 g via ORAL
  Filled 2019-03-07: qty 1

## 2019-03-07 MED ORDER — ACETAMINOPHEN 325 MG PO TABS
650.0000 mg | ORAL_TABLET | Freq: Four times a day (QID) | ORAL | Status: DC | PRN
  Administered 2019-03-11: 650 mg via ORAL
  Filled 2019-03-07: qty 2

## 2019-03-07 MED ORDER — ONDANSETRON HCL 4 MG/2ML IJ SOLN: 4.0000 mg | Freq: Four times a day (QID) | INTRAMUSCULAR | Status: DC | PRN

## 2019-03-07 MED ORDER — TRAZODONE HCL 100 MG PO TABS
100.0000 mg | ORAL_TABLET | Freq: Every day | ORAL | Status: DC
  Administered 2019-03-08 – 2019-03-11 (×5): 100 mg via ORAL
  Filled 2019-03-07: qty 1
  Filled 2019-03-07: qty 2
  Filled 2019-03-07 (×2): qty 1
  Filled 2019-03-07: qty 2

## 2019-03-07 MED ORDER — BUPROPION HCL ER (SR) 150 MG PO TB12
150.0000 mg | ORAL_TABLET | Freq: Every day | ORAL | Status: DC
  Administered 2019-03-08 – 2019-03-12 (×5): 150 mg via ORAL
  Filled 2019-03-07 (×5): qty 1

## 2019-03-07 MED ORDER — QUETIAPINE FUMARATE 100 MG PO TABS
100.0000 mg | ORAL_TABLET | Freq: Every morning | ORAL | Status: DC
  Administered 2019-03-08 – 2019-03-12 (×5): 100 mg via ORAL
  Filled 2019-03-07 (×5): qty 1

## 2019-03-07 MED ORDER — SODIUM CHLORIDE 0.9% FLUSH
3.0000 mL | Freq: Two times a day (BID) | INTRAVENOUS | Status: DC
  Administered 2019-03-08 – 2019-03-10 (×5): 3 mL via INTRAVENOUS

## 2019-03-07 MED ORDER — ARIPIPRAZOLE 5 MG PO TABS
5.0000 mg | ORAL_TABLET | Freq: Every day | ORAL | Status: DC
  Administered 2019-03-08 – 2019-03-12 (×5): 5 mg via ORAL
  Filled 2019-03-07 (×6): qty 1

## 2019-03-07 MED ORDER — SODIUM CHLORIDE 0.9 % IV SOLN: 250.0000 mL | INTRAVENOUS | Status: DC | PRN

## 2019-03-07 MED ORDER — ALBUTEROL SULFATE HFA 108 (90 BASE) MCG/ACT IN AERS: 8.0000 | INHALATION_SPRAY | RESPIRATORY_TRACT | Status: DC | PRN

## 2019-03-07 MED ORDER — SODIUM CHLORIDE 0.9% FLUSH: 3.0000 mL | INTRAVENOUS | Status: DC | PRN

## 2019-03-07 MED ORDER — IBUPROFEN 200 MG PO TABS: 400.0000 mg | ORAL_TABLET | Freq: Three times a day (TID) | ORAL | Status: DC | PRN

## 2019-03-07 NOTE — ED Notes (Signed)
89% on RA

## 2019-03-07 NOTE — ED Provider Notes (Signed)
[facility] DEPT Provider Note   CSN: [id number] Arrival date & time: 03/07/19  1734     History   Chief Complaint Chief Complaint  Patient presents with   Shortness of Breath   Fall    HPI Diane Gilbert is a 53 y.o. female.     The history is provided by the patient and medical records. No language interpreter was used.  Shortness of Breath Associated symptoms: cough (Chronic, denies acute change)   Fall Associated symptoms include shortness of breath.   Diane Gilbert is a 53 y.o. female  with a PMH as listed below including interstitial lung disease on 4 L nasal cannula at baseline, CHF who presents to the Emergency Department complaining of feeling lightheaded which led to a fall today.  Patient states that she hurt her right ankle and then hit the back of her head.  No loss of consciousness.  No nausea or vomiting.  She states that she has felt lightheaded for about 2 days now.  She states that she took her morning medicines this morning, but then could not remember if she had taken them or not, so she believes that she could have accidentally taken an extra dose of her prednisone, Seroquel and trazodone. Upon EMS arrival to her home, she was 54% -her nasal cannula was swollen, but kinked and not providing any oxygenation, therefore this value was essentially on room air.  She was placed on a new nasal cannula at 4 L where oxygen improved to 86%.  She was then placed on a nonrebreather which improved sats to 96%.  No medications were given prior to arrival for her symptoms.  She denies any chest pain.  She reports actually feeling much better with improved shortness of breath and lightheadedness since being placed on new oxygen, but still feels a little winded and as if it is hard to take a deep breath.  Denies any known fevers.  Always has a chronic cough, but denies any increased sputum production or changes in her chronic cough.  She is  on Lasix and reports taking this medication as directed without missed doses.  Denies lower extremity swelling.   Past Medical History:  Diagnosis Date   Acute on chronic respiratory failure with hypoxia ([place]) 06/07/2014   Acute respiratory failure with hypoxia ([place]) 03/23/2017   Alcohol abuse    Anxiety    Arthritis    CHF (congestive heart failure) ([place])    Community acquired pneumonia    Depression    Headache(784.0)    Hypertension    Insomnia    Interstitial lung disease ([place])    Oxygen dependent    Respiratory failure, acute and chronic ([place]) 05/09/2017   Schizophrenia ([place])    Shortness of breath dyspnea    Sleep apnea    Sleep apnea     Patient Active Problem List   Diagnosis Date Noted   Acute encephalopathy 03/07/2019   Acute respiratory failure with hypoxia ([place]) 02/26/2019   Falls 02/06/2019   Depression 02/06/2019   Chronic diastolic CHF (congestive heart failure) ([place]) 05/09/2017   Facial swelling 03/23/2017   Chronic respiratory failure with hypoxia ([place]) 41/08/129   Acute diastolic congestive heart failure ([place])    Schizophrenia ([place]) 03/24/2016   Leg swelling 01/16/2016   Interstitial lung disease ([place]) 07/16/2014   Respiratory bronchiolitis interstitial lung disease ([place]) 12/26/2013   Irregular menstrual cycle 01/23/2013   Smoking addiction 01/23/2013   Alcohol abuse 02/19/2012  Hyponatremia 02/19/2012   Abdominal pain 02/19/2012   Hypokalemia 02/19/2012    Past Surgical History:  Procedure Laterality Date   BACK SURGERY     LUNG BIOPSY Left 07/16/2014   Procedure: LUNG BIOPSY;  Surgeon: Grace Isaac, MD;  Location: [facility];  Service: Thoracic;  Laterality: Left;   MULTIPLE TOOTH EXTRACTIONS     RIGHT/LEFT HEART CATH AND CORONARY ANGIOGRAPHY N/A 01/04/2018   Procedure: RIGHT/LEFT HEART CATH AND CORONARY ANGIOGRAPHY;  Surgeon: Adrian Prows, MD;  Location: [facility];  Service: Cardiovascular;   Laterality: N/A;   VIDEO ASSISTED THORACOSCOPY Left 07/16/2014   Procedure: VIDEO ASSISTED THORACOSCOPY;  Surgeon: Grace Isaac, MD;  Location: [facility];  Service: Thoracic;  Laterality: Left;   VIDEO BRONCHOSCOPY Bilateral 04/11/2014   Procedure: VIDEO BRONCHOSCOPY WITH FLUORO;  Surgeon: Kathee Delton, MD;  Location: [facility];  Service: Cardiopulmonary;  Laterality: Bilateral;   VIDEO BRONCHOSCOPY N/A 07/16/2014   Procedure: VIDEO BRONCHOSCOPY;  Surgeon: Grace Isaac, MD;  Location: [facility] OR;  Service: Thoracic;  Laterality: N/A;   WEDGE RESECTION Left 07/16/2014   Procedure: WEDGE RESECTION;  Surgeon: Grace Isaac, MD;  Location: [facility] OR;  Service: Thoracic;  Laterality: Left;  left upper lobe lung resection     OB History    Gravida  0   Para  0   Term  0   Preterm  0   AB  0   Living  0     SAB  0   TAB  0   Ectopic  0   Multiple  0   Live Births               Home Medications    Prior to Admission medications   Medication Sig Start Date End Date Taking? Authorizing Provider  albuterol (PROVENTIL HFA;VENTOLIN HFA) 108 (90 BASE) MCG/ACT inhaler Inhale 2 puffs into the lungs every 6 (six) hours as needed for wheezing or shortness of breath. 05/09/15  Yes Juanito Doom, MD  albuterol (PROVENTIL) (2.5 MG/3ML) 0.083% nebulizer solution Take 3 mLs (2.5 mg total) by nebulization every 6 (six) hours as needed for wheezing or shortness of breath. 02/06/19  Yes Martyn Ehrich, NP  ARIPiprazole (ABILIFY) 5 MG tablet Take 5 mg by mouth daily. 02/23/19  Yes [provider]  budesonide-formoterol (SYMBICORT) 160-4.5 MCG/ACT inhaler INHALE TWO puffs into THE lungs TWICE DAILY Patient taking differently: Inhale 2 puffs into the lungs 2 (two) times daily.  02/06/19  Yes Martyn Ehrich, NP  buPROPion (WELLBUTRIN SR) 150 MG 12 hr tablet Take 150 mg by mouth daily. 02/14/19  Yes [provider]  diphenhydrAMINE (BENADRYL) 25 mg capsule Take 1  capsule (25 mg total) by mouth every 6 (six) hours as needed for itching. 03/01/19  Yes Debbe Odea, MD  famotidine (PEPCID) 20 MG tablet Take 1 tablet (20 mg total) by mouth at bedtime. 03/01/19  Yes Debbe Odea, MD  furosemide (LASIX) 40 MG tablet Take 40 mg by mouth daily. 03/01/19  Yes [provider]  gabapentin (NEURONTIN) 300 MG capsule Take 300 mg by mouth 3 (three) times daily.   Yes [provider]  ibuprofen (ADVIL) 800 MG tablet Take 400 mg by mouth every 8 (eight) hours as needed (pain).   Yes [provider]  losartan (COZAAR) 50 MG tablet Take 1 tablet (50 mg total) by mouth daily. 03/02/19  Yes Debbe Odea, MD  Multiple Vitamin (MULTIVITAMIN WITH MINERALS) TABS tablet  Take 1 tablet by mouth daily. 03/02/19  Yes Debbe Odea, MD  nicotine (NICODERM CQ - DOSED IN MG/24 HR) 7 mg/24hr patch Place 7 mg onto the skin daily as needed (smoking cessation).   Yes [provider]  nicotine polacrilex (NICORETTE) 2 MG gum Take 2 mg by mouth as needed for smoking cessation.   Yes [provider]  predniSONE (DELTASONE) 20 MG tablet 40 mg daily x7 days followed by 20 mg daily x7 days followed by 10 mg daily 03/01/19  Yes Rizwan, Eunice Blase, MD  QUEtiapine (SEROQUEL) 100 MG tablet Take 100 mg by mouth every morning. 01/31/19  Yes [provider]  QUEtiapine (SEROQUEL) 200 MG tablet Take 200 mg by mouth at bedtime. 02/11/19  Yes [provider]  rosuvastatin (CRESTOR) 10 MG tablet Take 1 tablet (10 mg total) by mouth daily. 01/17/18  Yes Blanchie Dessert, MD  traZODone (DESYREL) 100 MG tablet Take 100 mg by mouth at bedtime.   Yes [provider]  amLODipine (NORVASC) 5 MG tablet Take 1 tablet (5 mg total) by mouth daily. Patient not taking: Reported on 02/26/2019 01/17/18   Blanchie Dessert, MD  naproxen (NAPROSYN) 375 MG tablet Take 1 tablet (375 mg total) by mouth 2 (two) times daily. Patient not taking: Reported on 02/26/2019 12/05/18    Wieters, Hallie C, PA-C  OXYGEN Inhale 4 L into the lungs continuous.     [provider]    Family History Family History  Problem Relation Age of Onset   Breast cancer Mother    Obstructive Sleep Apnea Mother    COPD Maternal Aunt     Social History Social History   Tobacco Use   Smoking status: Current Every Day Smoker    Packs/day: 1.50    Years: 30.00    Pack years: 45.00    Types: Cigarettes   Smokeless tobacco: Never Used   Tobacco comment: smoking 3 cigarettes daily "every now and then" 11/04/16  Substance Use Topics   Alcohol use: Yes    Alcohol/week: 0.0 standard drinks    Frequency: Never    Comment: occasional    Drug use: Yes    Types: Cocaine     Allergies   Unasyn [ampicillin-sulbactam sodium], Contrast media [iodinated diagnostic agents], and Hydralazine   Review of Systems Review of Systems  Respiratory: Positive for cough (Chronic, denies acute change) and shortness of breath.   Musculoskeletal: Positive for arthralgias.  Neurological: Positive for light-headedness. Negative for syncope.  All other systems reviewed and are negative.    Physical Exam Updated Vital Signs BP (!) 172/97 (BP Location: Left Arm)    Pulse 97    Temp 98.2 F (36.8 C) (Oral)    Resp (!) 24    LMP 04/06/2017    SpO2 95%   Physical Exam Vitals signs and nursing note reviewed.  Constitutional:      General: She is not in acute distress.    Appearance: She is well-developed.  HENT:     Head: Normocephalic and atraumatic.  Neck:     Musculoskeletal: Neck supple.     Comments: No midline or paraspinal tenderness. Cardiovascular:     Rate and Rhythm: Normal rate and regular rhythm.     Heart sounds: Normal heart sounds. No murmur.  Pulmonary:     Comments: Diminished breath sounds with expiratory wheezing bilaterally.  On 4 L nasal cannula and oxygenating at 96%.  Speaking in full sentences, but appears dyspneic. Abdominal:  General: There is no  distension.     Palpations: Abdomen is soft.     Tenderness: There is no abdominal tenderness.  Musculoskeletal:     Comments: Tenderness to the right lateral malleolus.  No overlying skin changes.  Full range of motion.  Skin:    General: Skin is warm and dry.  Neurological:     Mental Status: She is alert and oriented to person, place, and time.     Comments: Alert, oriented, thought content appropriate. Able to give a coherent history. Speech is clear and goal oriented, able to follow commands.  Cranial Nerves:  II:  Peripheral visual fields grossly normal, pupils equal, round, reactive to light III, IV, VI: EOM intact bilaterally, ptosis not present V,VII: smile symmetric, eyes kept closed tightly against resistance, facial light touch sensation equal VIII: hearing grossly normal IX, X: symmetric soft palate movement, uvula elevates symmetrically  XI: bilateral shoulder shrug symmetric and strong XII: midline tongue extension 5/5 muscle strength in upper and lower extremities bilaterally including strong and equal grip strength and dorsiflexion/plantar flexion Sensory to light touch normal in all four extremities.       ED Treatments / Results  Labs (all labs ordered are listed, but only abnormal results are displayed) Labs Reviewed  BLOOD GAS, VENOUS - Abnormal; Notable for the following components:      Result Value   pCO2, Ven 105 (*)    Bicarbonate 49.4 (*)    Acid-Base Excess 16.1 (*)    All other components within normal limits  CBC WITH DIFFERENTIAL/PLATELET - Abnormal; Notable for the following components:   RBC 7.14 (*)    Hemoglobin 16.4 (*)    HCT 63.8 (*)    MCH 23.0 (*)    MCHC 25.7 (*)    RDW 22.2 (*)    All other components within normal limits  COMPREHENSIVE METABOLIC PANEL - Abnormal; Notable for the following components:   Chloride 93 (*)    CO2 41 (*)    Glucose, Bld 136 (*)    Calcium 8.6 (*)    Total Protein 6.2 (*)    Albumin 3.2 (*)    All  other components within normal limits  ACETAMINOPHEN LEVEL - Abnormal; Notable for the following components:   Acetaminophen (Tylenol), Serum <10 (*)    All other components within normal limits  BLOOD GAS, VENOUS - Abnormal; Notable for the following components:   pCO2, Ven 106 (*)    pO2, Ven 60.7 (*)    Bicarbonate 48.4 (*)    Acid-Base Excess 15.3 (*)    All other components within normal limits  SARS CORONAVIRUS 2 (HOSPITAL ORDER, [facility])  CULTURE, BLOOD (ROUTINE X 2)  CULTURE, BLOOD (ROUTINE X 2)  BRAIN NATRIURETIC PEPTIDE  SALICYLATE LEVEL    EKG None  Radiology Dg Chest 2 View  Result Date: 03/07/2019 CLINICAL DATA:  Shortness of breath EXAM: CHEST - 2 VIEW COMPARISON:  02/22/2019 FINDINGS: Cardiomegaly with vascular congestion. Diffuse interstitial and alveolar opacities concerning for edema. No effusions. No acute bony abnormality. IMPRESSION: Cardiomegaly with vascular congestion and concern for mild pulmonary edema. Electronically Signed   By: Rolm Baptise M.D.   On: 03/07/2019 19:00   Dg Ankle Complete Right  Result Date: 03/07/2019 CLINICAL DATA:  Fall with ankle pain EXAM: RIGHT ANKLE - COMPLETE 3+ VIEW COMPARISON:  None. FINDINGS: No fracture or malalignment. Ankle mortise is symmetric. Soft tissues are unremarkable. IMPRESSION: No acute osseous abnormality Electronically  Signed   By: Donavan Foil M.D.   On: 03/07/2019 19:01   Ct Head Wo Contrast  Result Date: 03/07/2019 CLINICAL DATA:  Fall 3 times today. EXAM: CT HEAD WITHOUT CONTRAST TECHNIQUE: Contiguous axial images were obtained from the base of the skull through the vertex without intravenous contrast. COMPARISON:  None. FINDINGS: Brain: Infarcts noted in the left occipital lobe which appears subacute to chronic. No acute intracranial abnormality. Specifically, no hemorrhage, hydrocephalus, mass lesion, acute infarction, or significant intracranial injury. Mild chronic small vessel  disease throughout the deep white matter. Vascular: No hyperdense vessel or unexpected calcification. Skull: No acute calvarial abnormality. Sinuses/Orbits: Visualized paranasal sinuses and mastoids clear. Orbital soft tissues unremarkable. 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 Chronic small vessel disease throughout the deep white matter. Electronically Signed   By: Rolm Baptise M.D.   On: 03/07/2019 19:36    Procedures Procedures (including critical care time)  CRITICAL CARE Performed by: Ozella Almond Jensyn [place]  Total critical care time: 35 minutes  Critical care time was exclusive of separately billable procedures and treating other patients.  Critical care was necessary to treat or prevent imminent or life-threatening deterioration.  Critical care was time spent personally by me on the following activities: development of treatment plan with patient and/or surrogate as well as nursing, discussions with consultants, evaluation of patient's response to treatment, examination of patient, obtaining history from patient or surrogate, ordering and performing treatments and interventions, ordering and review of laboratory studies, ordering and review of radiographic studies, pulse oximetry and re-evaluation of patient's condition.   Medications Ordered in ED Medications  albuterol (VENTOLIN HFA) 108 (90 Base) MCG/ACT inhaler 8 puff (has no administration in time range)     Initial Impression / Assessment and Plan / ED Course  I have reviewed the triage vital signs and the nursing notes.  Pertinent labs & imaging results that were available during my care of the patient were reviewed by me and considered in my medical decision making (see chart for details).       Diane Gilbert is a 53 y.o. female who presents to ED for feeling lightheaded and weak for several days.  She felt so weak earlier today that she fell, hurting her right ankle and  striking the back of her head.  She called EMS due to this.  Upon EMS arrival, she was noted to be hypoxic with oxygenation saturation of 54%.  She is supposed to be on 4 L of oxygen via nasal cannula at baseline.  Her nasal cannula was not working.  They did put a new nasal cannula on and her oxygenation saturations improved.  Upon arrival to the emergency department, she was on 4 L nasal cannula and oxygenating well in the 90s.  She reports feeling improved. VBG shows CO2 of 105.  Given her oxygenation was improving in she felt better as well, plan was to repeat CO2 to see if it would improve before starting on BiPAP.  Repeat was 106.  Order for BiPAP was placed.  CT head was performed as well which did show a left occipital infarct subacute versus chronic.  Discussed findings with Dr. Cheral Marker who reviewed images and felt this was more likely to be chronic, but MRI was be needed to further evaluate.  Recommended hospitalist to reconsult if acute or subacute infarct was noted on MRI.  Hospitalist was consulted who will admit.  Patient seen by and discussed with Dr. Stark Jock who  agrees with treatment plan.    Final Clinical Impressions(s) / ED Diagnoses   Final diagnoses:  Shortness of breath  Abnormal CT of the head    ED Discharge Orders    None       Briana Farner, Ozella Almond, PA-C 03/07/19 2250    Veryl Speak, MD 03/07/19 2257

## 2019-03-07 NOTE — Progress Notes (Signed)
Placed pt. on V-60 Bipap per order due to results of pts. VBG with >>Pc02, per pt.,"had sleep study prior and was unable to tolerate full face mask, this RT tried nasal mask and pt. Is dominant nose breather and unable to tolerate along with being claustrophobic, ED/admitting MD notified along with RT covering stepdown where pt. Is being admitted, Order d/c'd prior to admission.

## 2019-03-07 NOTE — ED Triage Notes (Signed)
Pt BIBA from home.   Per EMS- Upon arrival pts O2 was 54% on "kinked" nasal cannula, changed to new [place] on 4L, O2 increased to 86%.  Pt placed on NRB and pt O2 sats 96%. Pt reports wheezing in all fields. No meds administered.    Pt reports falling x3 today, denies any injuries. Denies LOC, denies head/neck/back pain.  C/o right knee pain. Pt AOx4.   Pt reports taking twice the prescribed dose of: seroquel, trazadone, prednisone ("accidental").    NSR VS 160/80 SpO2-96% NRB 110 Hr 20 R 137 CBG 97.3 T

## 2019-03-07 NOTE — ED Notes (Signed)
Patient transported to X-ray.

## 2019-03-07 NOTE — H&P (Signed)
History and Physical    Diane Gilbert KPT:[id number] DOB: 18-Oct-1965 DOA: 03/07/2019  PCP: Nolene Ebbs, MD   Patient coming from: Home   Chief Complaint: Confusion, gen weakness, falls   HPI: Diane Gilbert is a 53 y.o. female with medical history significant for schizophrenia, hypertension, COPD, interstitial lung disease, chronic hypoxic and hypercarbic respiratory failure, and chronic diastolic CHF, now presenting to the emergency department with confusion, generalized weakness, and falls at home.  Patient was admitted to the hospital about a week ago with acute on chronic respiratory failure after having run out of supplemental oxygen at home.  She called EMS today due to some confusion, generalized weakness, and falls.  She was found to be saturating 52% on EMS arrival but her oxygen tubing was noted to be kinked and not delivering any supplemental oxygen.  Patient reports falling due to generalized weakness, denies hitting her head or losing consciousness, but reports some right ankle pain after the falls.  She accidentally took a second dose of some of her medications today due to confusion.  She denies any fevers, chills, chest pain, or palpitations.  She denies any headache, change in vision or hearing, or focal numbness or weakness.  Patient reports that she is hungry and has been asking for something to eat.  ED Course: Upon arrival to the ED, patient is found to be afebrile, saturating mid 90s on her usual 4 L/min of supplemental oxygen, tachypneic, slightly tachycardic, and with stable blood pressure.  Noncontrast head CT is notable for a left occipital infarction that appears to be subacute to chronic.  Chest x-ray is notable for cardiomegaly and vascular congestion with possible mild pulmonary edema.  Plain films of the right ankle are negative for acute findings.  Chemistry panel features a bicarbonate of 47 and CBC is notable for mild polycythemia.  Neurology was  contacted by the ED physician regarding the head CT findings, suspects that this is more chronic in nature, recommends MRI brain non-emergently, and recommends a formal neurology consultation if there is acute or subacute infarction on the MRI. Patient was treated with albuterol in the emergency department and hospitalists consulted for admission..  Review of Systems:  All other systems reviewed and apart from HPI, are negative.  Past Medical History:  Diagnosis Date   Acute on chronic respiratory failure with hypoxia ([place]) 06/07/2014   Acute respiratory failure with hypoxia ([place]) 03/23/2017   Alcohol abuse    Anxiety    Arthritis    CHF (congestive heart failure) ([place])    Community acquired pneumonia    Depression    Headache(784.0)    Hypertension    Insomnia    Interstitial lung disease ([place])    Oxygen dependent    Respiratory failure, acute and chronic ([place]) 05/09/2017   Schizophrenia ([place])    Shortness of breath dyspnea    Sleep apnea    Sleep apnea     Past Surgical History:  Procedure Laterality Date   BACK SURGERY     LUNG BIOPSY Left 07/16/2014   Procedure: LUNG BIOPSY;  Surgeon: Grace Isaac, MD;  Location: [facility];  Service: Thoracic;  Laterality: Left;   MULTIPLE TOOTH EXTRACTIONS     RIGHT/LEFT HEART CATH AND CORONARY ANGIOGRAPHY N/A 01/04/2018   Procedure: RIGHT/LEFT HEART CATH AND CORONARY ANGIOGRAPHY;  Surgeon: Adrian Prows, MD;  Location: [facility];  Service: Cardiovascular;  Laterality: N/A;   VIDEO ASSISTED THORACOSCOPY Left 07/16/2014   Procedure: VIDEO ASSISTED THORACOSCOPY;  Surgeon: Grace Isaac, MD;  Location: [facility];  Service: Thoracic;  Laterality: Left;   VIDEO BRONCHOSCOPY Bilateral 04/11/2014   Procedure: VIDEO BRONCHOSCOPY WITH FLUORO;  Surgeon: Kathee Delton, MD;  Location: [facility];  Service: Cardiopulmonary;  Laterality: Bilateral;   VIDEO BRONCHOSCOPY N/A 07/16/2014   Procedure: VIDEO BRONCHOSCOPY;   Surgeon: Grace Isaac, MD;  Location: [facility];  Service: Thoracic;  Laterality: N/A;   WEDGE RESECTION Left 07/16/2014   Procedure: WEDGE RESECTION;  Surgeon: Grace Isaac, MD;  Location: [facility];  Service: Thoracic;  Laterality: Left;  left upper lobe lung resection     reports that she has been smoking cigarettes. She has a 45.00 pack-year smoking history. She has never used smokeless tobacco. She reports current alcohol use. She reports current drug use. Drug: Cocaine.  Allergies  Allergen Reactions   Unasyn [Ampicillin-Sulbactam Sodium] Swelling and Rash    angioedema   Contrast Media [Iodinated Diagnostic Agents] Nausea And Vomiting   Hydralazine Swelling    Family History  Problem Relation Age of Onset   Breast cancer Mother    Obstructive Sleep Apnea Mother    COPD Maternal Aunt      Prior to Admission medications   Medication Sig Start Date End Date Taking? Authorizing Provider  albuterol (PROVENTIL HFA;VENTOLIN HFA) 108 (90 BASE) MCG/ACT inhaler Inhale 2 puffs into the lungs every 6 (six) hours as needed for wheezing or shortness of breath. 05/09/15  Yes Juanito Doom, MD  albuterol (PROVENTIL) (2.5 MG/3ML) 0.083% nebulizer solution Take 3 mLs (2.5 mg total) by nebulization every 6 (six) hours as needed for wheezing or shortness of breath. 02/06/19  Yes Martyn Ehrich, NP  ARIPiprazole (ABILIFY) 5 MG tablet Take 5 mg by mouth daily. 02/23/19  Yes [provider]  budesonide-formoterol (SYMBICORT) 160-4.5 MCG/ACT inhaler INHALE TWO puffs into THE lungs TWICE DAILY Patient taking differently: Inhale 2 puffs into the lungs 2 (two) times daily.  02/06/19  Yes Martyn Ehrich, NP  buPROPion (WELLBUTRIN SR) 150 MG 12 hr tablet Take 150 mg by mouth daily. 02/14/19  Yes [provider]  diphenhydrAMINE (BENADRYL) 25 mg capsule Take 1 capsule (25 mg total) by mouth every 6 (six) hours as needed for itching. 03/01/19  Yes Debbe Odea, MD    famotidine (PEPCID) 20 MG tablet Take 1 tablet (20 mg total) by mouth at bedtime. 03/01/19  Yes Debbe Odea, MD  furosemide (LASIX) 40 MG tablet Take 40 mg by mouth daily. 03/01/19  Yes [provider]  gabapentin (NEURONTIN) 300 MG capsule Take 300 mg by mouth 3 (three) times daily.   Yes [provider]  ibuprofen (ADVIL) 800 MG tablet Take 400 mg by mouth every 8 (eight) hours as needed (pain).   Yes [provider]  losartan (COZAAR) 50 MG tablet Take 1 tablet (50 mg total) by mouth daily. 03/02/19  Yes Debbe Odea, MD  Multiple Vitamin (MULTIVITAMIN WITH MINERALS) TABS tablet Take 1 tablet by mouth daily. 03/02/19  Yes Debbe Odea, MD  nicotine (NICODERM CQ - DOSED IN MG/24 HR) 7 mg/24hr patch Place 7 mg onto the skin daily as needed (smoking cessation).   Yes [provider]  nicotine polacrilex (NICORETTE) 2 MG gum Take 2 mg by mouth as needed for smoking cessation.   Yes [provider]  predniSONE (DELTASONE) 20 MG tablet 40 mg daily x7 days followed by 20 mg daily x7 days followed by 10 mg daily 03/01/19  Yes Rizwan,  Saima, MD  QUEtiapine (SEROQUEL) 100 MG tablet Take 100 mg by mouth every morning. 01/31/19  Yes [provider]  QUEtiapine (SEROQUEL) 200 MG tablet Take 200 mg by mouth at bedtime. 02/11/19  Yes [provider]  rosuvastatin (CRESTOR) 10 MG tablet Take 1 tablet (10 mg total) by mouth daily. 01/17/18  Yes Blanchie Dessert, MD  traZODone (DESYREL) 100 MG tablet Take 100 mg by mouth at bedtime.   Yes [provider]  amLODipine (NORVASC) 5 MG tablet Take 1 tablet (5 mg total) by mouth daily. Patient not taking: Reported on 02/26/2019 01/17/18   Blanchie Dessert, MD  naproxen (NAPROSYN) 375 MG tablet Take 1 tablet (375 mg total) by mouth 2 (two) times daily. Patient not taking: Reported on 02/26/2019 12/05/18   Wieters, Hallie C, PA-C  OXYGEN Inhale 4 L into the lungs continuous.     [provider]     Physical Exam: Vitals:   03/07/19 1840 03/07/19 1845 03/07/19 1900 03/07/19 2205  BP:   (!) 150/102 (!) 172/97  Pulse: 99 99  97  Resp: (!) 24 (!) 34 (!) 26 (!) 24  Temp:    98.2 F (36.8 C)  TempSrc:    Oral  SpO2: 96% 94%  95%    Constitutional: NAD, calm  Eyes: PERTLA, lids and conjunctivae normal ENMT: Mucous membranes are moist. Posterior pharynx clear of any exudate or lesions.   Neck: normal, supple, no masses, no thyromegaly Respiratory: Tachypnea, increased WOB. No pallor or cyanosis. Diminished breath sounds bilaterally. Rales bilaterally.  Cardiovascular: S1 & S2 heard, regular rate and rhythm. No extremity edema.   Abdomen: No distension, no tenderness, soft. Bowel sounds active.  Musculoskeletal: no clubbing / cyanosis. No joint deformity upper and lower extremities.   Skin: no significant rashes, lesions, ulcers. Warm, dry, well-perfused. Neurologic: CN 2-12 grossly intact. Sensation intact. Strength 5/5 in all 4 limbs.  Psychiatric: Alert and oriented to person and place, but not oriented to month or year. Calm, cooperative.    Labs on Admission: I have personally reviewed following labs and imaging studies  CBC: Recent Labs  Lab 03/07/19 1809  WBC 9.6  NEUTROABS 6.7  HGB 16.4*  HCT 63.8*  MCV 89.4  PLT 696   Basic Metabolic Panel: Recent Labs  Lab 03/07/19 1809  NA 144  K 4.0  CL 93*  CO2 41*  GLUCOSE 136*  BUN 8  CREATININE 0.62  CALCIUM 8.6*   GFR: Estimated Creatinine Clearance: 99.7 mL/min (by C-G formula based on SCr of 0.62 mg/dL). Liver Function Tests: Recent Labs  Lab 03/07/19 1809  AST 17  ALT 23  ALKPHOS 60  BILITOT 0.7  PROT 6.2*  ALBUMIN 3.2*   No results for input(s): LIPASE, AMYLASE in the last 168 hours. No results for input(s): AMMONIA in the last 168 hours. Coagulation Profile: No results for input(s): INR, PROTIME in the last 168 hours. Cardiac Enzymes: No results for input(s): CKTOTAL, CKMB, CKMBINDEX,  TROPONINI in the last 168 hours. BNP (last 3 results) No results for input(s): PROBNP in the last 8760 hours. HbA1C: No results for input(s): HGBA1C in the last 72 hours. CBG: No results for input(s): GLUCAP in the last 168 hours. Lipid Profile: No results for input(s): CHOL, HDL, LDLCALC, TRIG, CHOLHDL, LDLDIRECT in the last 72 hours. Thyroid Function Tests: No results for input(s): TSH, T4TOTAL, FREET4, T3FREE, THYROIDAB in the last 72 hours. Anemia Panel: No results for input(s): VITAMINB12, FOLATE, FERRITIN, TIBC, IRON, RETICCTPCT in the  last 72 hours. Urine analysis:    Component Value Date/Time   COLORURINE YELLOW 08/02/2018 1745   APPEARANCEUR CLOUDY (A) 08/02/2018 1745   LABSPEC 1.019 08/02/2018 1745   PHURINE 5.0 08/02/2018 1745   GLUCOSEU NEGATIVE 08/02/2018 1745   HGBUR SMALL (A) 08/02/2018 1745   BILIRUBINUR NEGATIVE 08/02/2018 1745   KETONESUR 5 (A) 08/02/2018 1745   PROTEINUR 100 (A) 08/02/2018 1745   UROBILINOGEN 0.2 07/13/2014 1122   NITRITE NEGATIVE 08/02/2018 1745   LEUKOCYTESUR NEGATIVE 08/02/2018 1745   Sepsis Labs: _0 (procalcitonin:4,lacticidven:4) ) Recent Results (from the past 240 hour(s))  SARS Coronavirus 2 [facility] order, Performed in [facility] hospital lab) Nasopharyngeal Nasopharyngeal Swab     Status: None   Collection Time: 02/26/19  9:32 PM   Specimen: Nasopharyngeal Swab  Result Value Ref Range Status   SARS Coronavirus 2 NEGATIVE NEGATIVE Final    Comment: (NOTE) If result is NEGATIVE SARS-CoV-2 target nucleic acids are NOT DETECTED. The SARS-CoV-2 RNA is generally detectable in upper and lower  respiratory specimens during the acute phase of infection. The lowest  concentration of SARS-CoV-2 viral copies this assay can detect is 250  copies / mL. A negative result does not preclude SARS-CoV-2 infection  and should not be used as the sole basis for treatment or other  patient management decisions.  A negative result may occur  with  improper specimen collection / handling, submission of specimen other  than nasopharyngeal swab, presence of viral mutation(s) within the  areas targeted by this assay, and inadequate number of viral copies  (<250 copies / mL). A negative result must be combined with clinical  observations, patient history, and epidemiological information. If result is POSITIVE SARS-CoV-2 target nucleic acids are DETECTED. The SARS-CoV-2 RNA is generally detectable in upper and lower  respiratory specimens dur ing the acute phase of infection.  Positive  results are indicative of active infection with SARS-CoV-2.  Clinical  correlation with patient history and other diagnostic information is  necessary to determine patient infection status.  Positive results do  not rule out bacterial infection or co-infection with other viruses. If result is PRESUMPTIVE POSTIVE SARS-CoV-2 nucleic acids MAY BE PRESENT.   A presumptive positive result was obtained on the submitted specimen  and confirmed on repeat testing.  While 2019 novel coronavirus  (SARS-CoV-2) nucleic acids may be present in the submitted sample  additional confirmatory testing may be necessary for epidemiological  and / or clinical management purposes  to differentiate between  SARS-CoV-2 and other Sarbecovirus currently known to infect humans.  If clinically indicated additional testing with an alternate test  methodology [phone]) is advised. The SARS-CoV-2 RNA is generally  detectable in upper and lower respiratory sp ecimens during the acute  phase of infection. The expected result is Negative. Fact Sheet for Patients:  StrictlyIdeas.no Fact Sheet for Healthcare Providers: BankingDealers.co.za This test is not yet approved or cleared by the [place] FDA and has been authorized for detection and/or diagnosis of SARS-CoV-2 by FDA under an Emergency Use Authorization (EUA).  This EUA  will remain in effect (meaning this test can be used) for the duration of the COVID-19 declaration under Section 564(b)(1) of the Act, 21 U.S.C. section 360bbb-3(b)(1), unless the authorization is terminated or revoked sooner. Performed at [facility], [place] [address] [place], [place] [address]   MRSA PCR Screening     Status: None   Collection Time: 02/27/19  1:50 AM   Specimen: Nasopharyngeal  Result Value Ref  Range Status   MRSA by PCR NEGATIVE NEGATIVE Final    Comment:        The GeneXpert MRSA Assay (FDA approved for NASAL specimens only), is one component of a comprehensive MRSA colonization surveillance program. It is not intended to diagnose MRSA infection nor to guide or monitor treatment for MRSA infections. Performed at [facility], [place] [address], [place], [place] [address]   SARS Coronavirus 2 [facility] order, Performed in [facility] hospital lab) Nasopharyngeal Nasopharyngeal Swab     Status: None   Collection Time: 03/07/19  7:12 PM   Specimen: Nasopharyngeal Swab  Result Value Ref Range Status   SARS Coronavirus 2 NEGATIVE NEGATIVE Final    Comment: (NOTE) If result is NEGATIVE SARS-CoV-2 target nucleic acids are NOT DETECTED. The SARS-CoV-2 RNA is generally detectable in upper and lower  respiratory specimens during the acute phase of infection. The lowest  concentration of SARS-CoV-2 viral copies this assay can detect is 250  copies / mL. A negative result does not preclude SARS-CoV-2 infection  and should not be used as the sole basis for treatment or other  patient management decisions.  A negative result may occur with  improper specimen collection / handling, submission of specimen other  than nasopharyngeal swab, presence of viral mutation(s) within the  areas targeted by this assay, and inadequate number of viral copies  (<250 copies / mL). A negative result must be combined with clinical  observations, patient history, and  epidemiological information. If result is POSITIVE SARS-CoV-2 target nucleic acids are DETECTED. The SARS-CoV-2 RNA is generally detectable in upper and lower  respiratory specimens dur ing the acute phase of infection.  Positive  results are indicative of active infection with SARS-CoV-2.  Clinical  correlation with patient history and other diagnostic information is  necessary to determine patient infection status.  Positive results do  not rule out bacterial infection or co-infection with other viruses. If result is PRESUMPTIVE POSTIVE SARS-CoV-2 nucleic acids MAY BE PRESENT.   A presumptive positive result was obtained on the submitted specimen  and confirmed on repeat testing.  While 2019 novel coronavirus  (SARS-CoV-2) nucleic acids may be present in the submitted sample  additional confirmatory testing may be necessary for epidemiological  and / or clinical management purposes  to differentiate between  SARS-CoV-2 and other Sarbecovirus currently known to infect humans.  If clinically indicated additional testing with an alternate test  methodology [phone]) is advised. The SARS-CoV-2 RNA is generally  detectable in upper and lower respiratory sp ecimens during the acute  phase of infection. The expected result is Negative. Fact Sheet for Patients:  StrictlyIdeas.no Fact Sheet for Healthcare Providers: BankingDealers.co.za This test is not yet approved or cleared by the [place] FDA and has been authorized for detection and/or diagnosis of SARS-CoV-2 by FDA under an Emergency Use Authorization (EUA).  This EUA will remain in effect (meaning this test can be used) for the duration of the COVID-19 declaration under Section 564(b)(1) of the Act, 21 U.S.C. section 360bbb-3(b)(1), unless the authorization is terminated or revoked sooner. Performed at [facility], [place] [address] [place], [place] [address]       Radiological Exams on Admission: Dg Chest 2 View  Result Date: 03/07/2019 CLINICAL DATA:  Shortness of breath EXAM: CHEST - 2 VIEW COMPARISON:  02/22/2019 FINDINGS: Cardiomegaly with vascular congestion. Diffuse interstitial and alveolar opacities concerning for edema. No effusions. No acute bony abnormality. IMPRESSION: Cardiomegaly with vascular congestion  and concern for mild pulmonary edema. Electronically Signed   By: Rolm Baptise M.D.   On: 03/07/2019 19:00   Dg Ankle Complete Right  Result Date: 03/07/2019 CLINICAL DATA:  Fall with ankle pain EXAM: RIGHT ANKLE - COMPLETE 3+ VIEW COMPARISON:  None. FINDINGS: No fracture or malalignment. Ankle mortise is symmetric. Soft tissues are unremarkable. IMPRESSION: No acute osseous abnormality Electronically Signed   By: Donavan Foil M.D.   On: 03/07/2019 19:01   Ct Head Wo Contrast  Result Date: 03/07/2019 CLINICAL DATA:  Fall 3 times today. EXAM: CT HEAD WITHOUT CONTRAST TECHNIQUE: Contiguous axial images were obtained from the base of the skull through the vertex without intravenous contrast. COMPARISON:  None. FINDINGS: Brain: Infarcts noted in the left occipital lobe which appears subacute to chronic. No acute intracranial abnormality. Specifically, no hemorrhage, hydrocephalus, mass lesion, acute infarction, or significant intracranial injury. Mild chronic small vessel disease throughout the deep white matter. Vascular: No hyperdense vessel or unexpected calcification. Skull: No acute calvarial abnormality. Sinuses/Orbits: Visualized paranasal sinuses and mastoids clear. Orbital soft tissues unremarkable. 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 Chronic small vessel disease throughout the deep white matter. Electronically Signed   By: Rolm Baptise M.D.   On: 03/07/2019 19:36    EKG: Ordered, not yet performed.   Assessment/Plan   1. Acute encephalopathy  - Presents with confusion, gen weakness, and  falls, is found to be hypoxic in 50's on EMS arrival, pCO2 is 106 on VBG in ED, and head CT reveals a left occipital infarction that appears subacute to chronic  - Hypercapnia and hypoxia seem to be most likely etiologies   - Address suspected respiratory etiology as below with diuresis, inhalers, and BiPAP as tolerated, and check MRI brain as discussed below    2. Acute on chronic diastolic CHF; acute on chronic hypoxic and hypercarbic respiratory failure  - She was found to be saturating 50's at home but oxygen tubing had been kinked and not delivering any oxygen, sats remain 90's on her usual 4 Lpm in ED  - VBG with pCO2 of 106  - There is no leg swelling noted but she has rales on auscultation and CXR findings suggests mild pulm edema  - Diurese with Lasix 40 mg IV q12h, continue losartan, follow daily wt and I/O's    3. Interstitial lung disease  - Continue inhalers, prednisone, supplemental O2   4. Schizophrenia, depression, anxiety  - Continue Abilify, Wellbutrin, Seroquel, and trazodone    5. Ischemic stroke  - Left occipital infarction noted on CT, reviewed by neurology who suspects it to be chronic and recommends non-emergent MRI brain with formal consultation if the CVA appear subacute or acute on MRI   - Check MRI brain, check EKG, continue statin    6. Hypertension  - Continue losartan     PPE: Mask, face shield  DVT prophylaxis: Lovenox  Code Status: Full  Family Communication: Discussed with patient  Consults called: None  Admission status: Observation     Vianne Bulls, MD [facility] Pager [phone]  If 7PM-7AM, please contact night-coverage [email or web address] Password [email or web address]  03/07/2019, 11:10 PM

## 2019-03-07 NOTE — ED Notes (Signed)
Respiratory at bedside.

## 2019-03-07 NOTE — ED Notes (Signed)
7.29  CO2 105 42 PO2 49.4 bicarb

## 2019-03-07 NOTE — ED Notes (Signed)
ED TO INPATIENT HANDOFF REPORT  ED Nurse Name and Phone #: Fredonia Highland [phone]  S Name/Age/Gender Diane Gilbert 53 y.o. female Room/Bed: WA18/WA18  Code Status   Code Status: Prior  Home/SNF/Other Home Patient oriented to: self, place, time and situation Is this baseline? Yes   Triage Complete: Triage complete  Chief Complaint Shortness of Breath  Triage Note Pt BIBA from home.   Per EMS- Upon arrival pts O2 was 54% on "kinked" nasal cannula, changed to new [place] on 4L, O2 increased to 86%.  Pt placed on NRB and pt O2 sats 96%. Pt reports wheezing in all fields. No meds administered.    Pt reports falling x3 today, denies any injuries. Denies LOC, denies head/neck/back pain.  C/o right knee pain. Pt AOx4.   Pt reports taking twice the prescribed dose of: seroquel, trazadone, prednisone ("accidental").    NSR VS 160/80 SpO2-96% NRB 110 Hr 20 R 137 CBG 97.3 T   Allergies Allergies  Allergen Reactions  . Unasyn [Ampicillin-Sulbactam Sodium] Swelling and Rash    angioedema  . Contrast Media [Iodinated Diagnostic Agents] Nausea And Vomiting  . Hydralazine Swelling    Level of Care/Admitting Diagnosis ED Disposition    ED Disposition Condition Comment   Admit  Hospital Area: [facility] [[id number]]  Level of Care: Stepdown [14]  Admit to SDU based on following criteria: Respiratory Distress:  Frequent assessment and/or intervention to maintain adequate ventilation/respiration, pulmonary toilet, and respiratory treatment.  Covid Evaluation: Confirmed COVID Negative  Diagnosis: Acute encephalopathy [202542]  Admitting Physician: Vianne Bulls [[id number]]  Attending Physician: Vianne Bulls [[id number]]  PT Class (Do Not Modify): Observation [104]  PT Acc Code (Do Not Modify): Observation [10022]       B Medical/Surgery History Past Medical History:  Diagnosis Date  . Acute on chronic respiratory failure with hypoxia ([place]) 06/07/2014   . Acute respiratory failure with hypoxia ([place]) 03/23/2017  . Alcohol abuse   . Anxiety   . Arthritis   . CHF (congestive heart failure) ([place])   . Community acquired pneumonia   . Depression   . Headache(784.0)   . Hypertension   . Insomnia   . Interstitial lung disease ([place])   . Oxygen dependent   . Respiratory failure, acute and chronic ([place]) 05/09/2017  . Schizophrenia ([place])   . Shortness of breath dyspnea   . Sleep apnea   . Sleep apnea    Past Surgical History:  Procedure Laterality Date  . BACK SURGERY    . LUNG BIOPSY Left 07/16/2014   Procedure: LUNG BIOPSY;  Surgeon: Grace Isaac, MD;  Location: [facility];  Service: Thoracic;  Laterality: Left;  Marland Kitchen MULTIPLE TOOTH EXTRACTIONS    . RIGHT/LEFT HEART CATH AND CORONARY ANGIOGRAPHY N/A 01/04/2018   Procedure: RIGHT/LEFT HEART CATH AND CORONARY ANGIOGRAPHY;  Surgeon: Adrian Prows, MD;  Location: [facility];  Service: Cardiovascular;  Laterality: N/A;  . VIDEO ASSISTED THORACOSCOPY Left 07/16/2014   Procedure: VIDEO ASSISTED THORACOSCOPY;  Surgeon: Grace Isaac, MD;  Location: [facility];  Service: Thoracic;  Laterality: Left;  Marland Kitchen VIDEO BRONCHOSCOPY Bilateral 04/11/2014   Procedure: VIDEO BRONCHOSCOPY WITH FLUORO;  Surgeon: Kathee Delton, MD;  Location: [facility];  Service: Cardiopulmonary;  Laterality: Bilateral;  . VIDEO BRONCHOSCOPY N/A 07/16/2014   Procedure: VIDEO BRONCHOSCOPY;  Surgeon: Grace Isaac, MD;  Location: [facility];  Service: Thoracic;  Laterality: N/A;  . WEDGE RESECTION Left 07/16/2014   Procedure: WEDGE RESECTION;  Surgeon: Grace Isaac, MD;  Location: [facility];  Service: Thoracic;  Laterality: Left;  left upper lobe lung resection     A IV Location/Drains/Wounds Patient Lines/Drains/Airways Status   Active Line/Drains/Airways    Name:   Placement date:   Placement time:   Site:   Days:   Peripheral IV 03/07/19 Anterior;Right;Distal Forearm   03/07/19    -    Forearm   less than 1   Peripheral IV  03/07/19 Left Antecubital   03/07/19    1810    Antecubital   less than 1          Intake/Output Last 24 hours No intake or output data in the 24 hours ending 03/07/19 2255  Labs/Imaging Results for orders placed or performed during the hospital encounter of 03/07/19 (from the past 28 hour(s))  Blood gas, venous (at [facility] and [facility], not at [facility])     Status: Abnormal   Collection Time: 03/07/19  6:03 PM  Result Value Ref Range   pH, Ven 7.29 7.250 - 7.430   pCO2, Ven 105 (HH) 44.0 - 60.0 mmHg    Comment: CRITICAL RESULT CALLED TO, READ BACK BY AND VERIFIED WITH: RN [facility] BRILLE AT 1815 BY DEE WALTERS RRT ON 03/07/2019    pO2, Ven 42.5 32.0 - 45.0 mmHg   Bicarbonate 49.4 (H) 20.0 - 28.0 mmol/L   Acid-Base Excess 16.1 (H) 0.0 - 2.0 mmol/L   O2 Saturation 75.8 %   Patient temperature 37.0    Collection site DRAWN BY RN    Drawn by DRAWN BY RN    Sample type VEIN     Comment: Performed at [facility], [place] [address] [place], [place] [address]  CBC with Differential     Status: Abnormal   Collection Time: 03/07/19  6:09 PM  Result Value Ref Range   WBC 9.6 4.0 - 10.5 K/uL   RBC 7.14 (H) 3.87 - 5.11 MIL/uL   Hemoglobin 16.4 (H) 12.0 - 15.0 g/dL   HCT 63.8 (H) 36.0 - 46.0 %   MCV 89.4 80.0 - 100.0 fL   MCH 23.0 (L) 26.0 - 34.0 pg   MCHC 25.7 (L) 30.0 - 36.0 g/dL   RDW 22.2 (H) 11.5 - 15.5 %   Platelets 197 150 - 400 K/uL   nRBC 0.0 0.0 - 0.2 %   Neutrophils Relative % 70 %   Neutro Abs 6.7 1.7 - 7.7 K/uL   Lymphocytes Relative 18 %   Lymphs Abs 1.8 0.7 - 4.0 K/uL   Monocytes Relative 10 %   Monocytes Absolute 1.0 0.1 - 1.0 K/uL   Eosinophils Relative 1 %   Eosinophils Absolute 0.1 0.0 - 0.5 K/uL   Basophils Relative 0 %   Basophils Absolute 0.0 0.0 - 0.1 K/uL   RBC Morphology MORPHOLOGY UNREMARKABLE    Immature Granulocytes 1 %   Abs Immature Granulocytes 0.05 0.00 - 0.07 K/uL    Comment: Performed at [facility], [place] [address]  [place], [place] [address]  Brain natriuretic peptide     Status: None   Collection Time: 03/07/19  6:09 PM  Result Value Ref Range   B Natriuretic Peptide 79.9 0.0 - 100.0 pg/mL    Comment: Performed at [facility], [place] [address], [place], [place] [address]  Comprehensive metabolic panel     Status: Abnormal   Collection Time: 03/07/19  6:09 PM  Result Value Ref Range   Sodium 144  135 - 145 mmol/L   Potassium 4.0 3.5 - 5.1 mmol/L   Chloride 93 (L) 98 - 111 mmol/L   CO2 41 (H) 22 - 32 mmol/L   Glucose, Bld 136 (H) 70 - 99 mg/dL   BUN 8 6 - 20 mg/dL   Creatinine, Ser 0.62 0.44 - 1.00 mg/dL   Calcium 8.6 (L) 8.9 - 10.3 mg/dL   Total Protein 6.2 (L) 6.5 - 8.1 g/dL   Albumin 3.2 (L) 3.5 - 5.0 g/dL   AST 17 15 - 41 U/L   ALT 23 0 - 44 U/L   Alkaline Phosphatase 60 38 - 126 U/L   Total Bilirubin 0.7 0.3 - 1.2 mg/dL   GFR calc non Af Amer >60 >60 mL/min   GFR calc Af Amer >60 >60 mL/min   Anion gap 10 5 - 15    Comment: Performed at [facility], [place] [address], [place], [place] [address]  Acetaminophen level     Status: Abnormal   Collection Time: 03/07/19  6:09 PM  Result Value Ref Range   Acetaminophen (Tylenol), Serum <10 (L) 10 - 30 ug/mL    Comment: (NOTE) Therapeutic concentrations vary significantly. A range of 10-30 ug/mL  may be an effective concentration for many patients. However, some  are best treated at concentrations outside of this range. Acetaminophen concentrations >150 ug/mL at 4 hours after ingestion  and >50 ug/mL at 12 hours after ingestion are often associated with  toxic reactions. Performed at [facility], [place] [address] [place], [place] [address]   Salicylate level     Status: None   Collection Time: 03/07/19  6:09 PM  Result Value Ref Range   Salicylate Lvl <4.1 2.8 - 30.0 mg/dL    Comment: Performed at [facility], [place] [address] [place], [place] [address]  SARS Coronavirus  2 [facility] order, Performed in [facility] hospital lab) Nasopharyngeal Nasopharyngeal Swab     Status: None   Collection Time: 03/07/19  7:12 PM   Specimen: Nasopharyngeal Swab  Result Value Ref Range   SARS Coronavirus 2 NEGATIVE NEGATIVE    Comment: (NOTE) If result is NEGATIVE SARS-CoV-2 target nucleic acids are NOT DETECTED. The SARS-CoV-2 RNA is generally detectable in upper and lower  respiratory specimens during the acute phase of infection. The lowest  concentration of SARS-CoV-2 viral copies this assay can detect is 250  copies / mL. A negative result does not preclude SARS-CoV-2 infection  and should not be used as the sole basis for treatment or other  patient management decisions.  A negative result may occur with  improper specimen collection / handling, submission of specimen other  than nasopharyngeal swab, presence of viral mutation(s) within the  areas targeted by this assay, and inadequate number of viral copies  (<250 copies / mL). A negative result must be combined with clinical  observations, patient history, and epidemiological information. If result is POSITIVE SARS-CoV-2 target nucleic acids are DETECTED. The SARS-CoV-2 RNA is generally detectable in upper and lower  respiratory specimens dur ing the acute phase of infection.  Positive  results are indicative of active infection with SARS-CoV-2.  Clinical  correlation with patient history and other diagnostic information is  necessary to determine patient infection status.  Positive results do  not rule out bacterial infection or co-infection with other viruses. If result is PRESUMPTIVE POSTIVE SARS-CoV-2 nucleic acids MAY BE PRESENT.   A presumptive positive result was obtained on the submitted  specimen  and confirmed on repeat testing.  While 2019 novel coronavirus  (SARS-CoV-2) nucleic acids may be present in the submitted sample  additional confirmatory testing may be necessary for epidemiological  and  / or clinical management purposes  to differentiate between  SARS-CoV-2 and other Sarbecovirus currently known to infect humans.  If clinically indicated additional testing with an alternate test  methodology [phone]) is advised. The SARS-CoV-2 RNA is generally  detectable in upper and lower respiratory sp ecimens during the acute  phase of infection. The expected result is Negative. Fact Sheet for Patients:  StrictlyIdeas.no Fact Sheet for Healthcare Providers: BankingDealers.co.za This test is not yet approved or cleared by the [place] FDA and has been authorized for detection and/or diagnosis of SARS-CoV-2 by FDA under an Emergency Use Authorization (EUA).  This EUA will remain in effect (meaning this test can be used) for the duration of the COVID-19 declaration under Section 564(b)(1) of the Act, 21 U.S.C. section 360bbb-3(b)(1), unless the authorization is terminated or revoked sooner. Performed at [facility], [place] [address] [place], [place] [address]   Blood gas, venous (at [facility] and [facility], not at [facility])     Status: Abnormal   Collection Time: 03/07/19  8:55 PM  Result Value Ref Range   pH, Ven 7.286 7.250 - 7.430   pCO2, Ven 106 (HH) 44.0 - 60.0 mmHg    Comment: CRITICAL RESULT CALLED TO, READ BACK BY AND VERIFIED WITH: Angelina Ok, RN AT 2130 BY PATRICK SWEENEY RRT, RCP ON 03/07/2019    pO2, Ven 60.7 (H) 32.0 - 45.0 mmHg   Bicarbonate 48.4 (H) 20.0 - 28.0 mmol/L   Acid-Base Excess 15.3 (H) 0.0 - 2.0 mmol/L   O2 Saturation 87.6 %   Patient temperature 99.8    Collection site VEIN    Drawn by [id number]    Sample type VENOUS     Comment: Performed at [facility], [place] [address], [place],  [address]   Dg Chest 2 View  Result Date: 03/07/2019 CLINICAL DATA:  Shortness of breath EXAM: CHEST - 2 VIEW COMPARISON:  02/22/2019 FINDINGS: Cardiomegaly with vascular congestion. Diffuse  interstitial and alveolar opacities concerning for edema. No effusions. No acute bony abnormality. IMPRESSION: Cardiomegaly with vascular congestion and concern for mild pulmonary edema. Electronically Signed   By: Rolm Baptise M.D.   On: 03/07/2019 19:00   Dg Ankle Complete Right  Result Date: 03/07/2019 CLINICAL DATA:  Fall with ankle pain EXAM: RIGHT ANKLE - COMPLETE 3+ VIEW COMPARISON:  None. FINDINGS: No fracture or malalignment. Ankle mortise is symmetric. Soft tissues are unremarkable. IMPRESSION: No acute osseous abnormality Electronically Signed   By: Donavan Foil M.D.   On: 03/07/2019 19:01   Ct Head Wo Contrast  Result Date: 03/07/2019 CLINICAL DATA:  Fall 3 times today. EXAM: CT HEAD WITHOUT CONTRAST TECHNIQUE: Contiguous axial images were obtained from the base of the skull through the vertex without intravenous contrast. COMPARISON:  None. FINDINGS: Brain: Infarcts noted in the left occipital lobe which appears subacute to chronic. No acute intracranial abnormality. Specifically, no hemorrhage, hydrocephalus, mass lesion, acute infarction, or significant intracranial injury. Mild chronic small vessel disease throughout the deep white matter. Vascular: No hyperdense vessel or unexpected calcification. Skull: No acute calvarial abnormality. Sinuses/Orbits: Visualized paranasal sinuses and mastoids clear. Orbital soft tissues unremarkable. 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 Chronic small vessel disease throughout the deep white matter. Electronically Signed   By:  Rolm Baptise M.D.   On: 03/07/2019 19:36    Pending Labs Unresulted Labs (From admission, onward)    Start     Ordered   03/07/19 1853  Culture, blood (routine x 2)  BLOOD CULTURE X 2,   STAT     03/07/19 1852          Vitals/Pain Today's Vitals   03/07/19 1840 03/07/19 1845 03/07/19 1900 03/07/19 2205  BP:   (!) 150/102 (!) 172/97  Pulse: 99 99  97  Resp: (!) 24 (!) 34 (!)  26 (!) 24  Temp:    98.2 F (36.8 C)  TempSrc:    Oral  SpO2: 96% 94%  95%    Isolation Precautions No active isolations  Medications Medications  albuterol (VENTOLIN HFA) 108 (90 Base) MCG/ACT inhaler 8 puff (has no administration in time range)    Mobility non-ambulatory High fall risk   Focused Assessments Pulmonary Assessment Handoff:  Lung sounds: Bilateral Breath Sounds: Diminished, Expiratory wheezes L Breath Sounds: Diminished, Expiratory wheezes R Breath Sounds: Diminished, Expiratory wheezes O2 Device: Nasal Cannula O2 Flow Rate (L/min): 4 L/min      R Recommendations: See Admitting Provider Note  Report given to:   Additional Notes:  Mri will be done in the AM per md

## 2019-03-08 ENCOUNTER — Ambulatory Visit: Payer: Medicaid Other | Admitting: Physical Therapy

## 2019-03-08 ENCOUNTER — Observation Stay (HOSPITAL_COMMUNITY): Payer: Medicaid Other

## 2019-03-08 DIAGNOSIS — K5900 Constipation, unspecified: Secondary | ICD-10-CM | POA: Diagnosis not present

## 2019-03-08 DIAGNOSIS — Z9981 Dependence on supplemental oxygen: Secondary | ICD-10-CM | POA: Diagnosis not present

## 2019-03-08 DIAGNOSIS — F17210 Nicotine dependence, cigarettes, uncomplicated: Secondary | ICD-10-CM | POA: Diagnosis present

## 2019-03-08 DIAGNOSIS — Z23 Encounter for immunization: Secondary | ICD-10-CM | POA: Diagnosis not present

## 2019-03-08 DIAGNOSIS — Z888 Allergy status to other drugs, medicaments and biological substances status: Secondary | ICD-10-CM | POA: Diagnosis not present

## 2019-03-08 DIAGNOSIS — T43211A Poisoning by selective serotonin and norepinephrine reuptake inhibitors, accidental (unintentional), initial encounter: Secondary | ICD-10-CM | POA: Diagnosis present

## 2019-03-08 DIAGNOSIS — M549 Dorsalgia, unspecified: Secondary | ICD-10-CM | POA: Diagnosis present

## 2019-03-08 DIAGNOSIS — G9341 Metabolic encephalopathy: Secondary | ICD-10-CM | POA: Diagnosis present

## 2019-03-08 DIAGNOSIS — J9621 Acute and chronic respiratory failure with hypoxia: Secondary | ICD-10-CM | POA: Diagnosis present

## 2019-03-08 DIAGNOSIS — G8929 Other chronic pain: Secondary | ICD-10-CM | POA: Diagnosis present

## 2019-03-08 DIAGNOSIS — Z20828 Contact with and (suspected) exposure to other viral communicable diseases: Secondary | ICD-10-CM | POA: Diagnosis present

## 2019-03-08 DIAGNOSIS — J9622 Acute and chronic respiratory failure with hypercapnia: Secondary | ICD-10-CM | POA: Diagnosis present

## 2019-03-08 DIAGNOSIS — Z91041 Radiographic dye allergy status: Secondary | ICD-10-CM | POA: Diagnosis not present

## 2019-03-08 DIAGNOSIS — Z7951 Long term (current) use of inhaled steroids: Secondary | ICD-10-CM | POA: Diagnosis not present

## 2019-03-08 DIAGNOSIS — G4733 Obstructive sleep apnea (adult) (pediatric): Secondary | ICD-10-CM | POA: Diagnosis present

## 2019-03-08 DIAGNOSIS — Z8673 Personal history of transient ischemic attack (TIA), and cerebral infarction without residual deficits: Secondary | ICD-10-CM | POA: Diagnosis not present

## 2019-03-08 DIAGNOSIS — F209 Schizophrenia, unspecified: Secondary | ICD-10-CM | POA: Diagnosis present

## 2019-03-08 DIAGNOSIS — F40240 Claustrophobia: Secondary | ICD-10-CM | POA: Diagnosis present

## 2019-03-08 DIAGNOSIS — E785 Hyperlipidemia, unspecified: Secondary | ICD-10-CM | POA: Diagnosis present

## 2019-03-08 DIAGNOSIS — M1990 Unspecified osteoarthritis, unspecified site: Secondary | ICD-10-CM | POA: Diagnosis present

## 2019-03-08 DIAGNOSIS — I5033 Acute on chronic diastolic (congestive) heart failure: Secondary | ICD-10-CM | POA: Diagnosis present

## 2019-03-08 DIAGNOSIS — Z825 Family history of asthma and other chronic lower respiratory diseases: Secondary | ICD-10-CM | POA: Diagnosis not present

## 2019-03-08 DIAGNOSIS — Z803 Family history of malignant neoplasm of breast: Secondary | ICD-10-CM | POA: Diagnosis not present

## 2019-03-08 DIAGNOSIS — I110 Hypertensive heart disease with heart failure: Secondary | ICD-10-CM | POA: Diagnosis present

## 2019-03-08 DIAGNOSIS — G9340 Encephalopathy, unspecified: Secondary | ICD-10-CM | POA: Diagnosis not present

## 2019-03-08 DIAGNOSIS — I5031 Acute diastolic (congestive) heart failure: Secondary | ICD-10-CM | POA: Diagnosis present

## 2019-03-08 DIAGNOSIS — R0602 Shortness of breath: Secondary | ICD-10-CM | POA: Diagnosis present

## 2019-03-08 LAB — GLUCOSE, CAPILLARY: Glucose-Capillary: 108 mg/dL — ABNORMAL HIGH (ref 70–99)

## 2019-03-08 LAB — CBC
HCT: 63.8 % — ABNORMAL HIGH (ref 36.0–46.0)
Hemoglobin: 16.3 g/dL — ABNORMAL HIGH (ref 12.0–15.0)
MCH: 22.8 pg — ABNORMAL LOW (ref 26.0–34.0)
MCHC: 25.5 g/dL — ABNORMAL LOW (ref 30.0–36.0)
MCV: 89.1 fL (ref 80.0–100.0)
Platelets: 184 10*3/uL (ref 150–400)
RBC: 7.16 MIL/uL — ABNORMAL HIGH (ref 3.87–5.11)
RDW: 22.5 % — ABNORMAL HIGH (ref 11.5–15.5)
WBC: 9.7 10*3/uL (ref 4.0–10.5)
nRBC: 0 % (ref 0.0–0.2)

## 2019-03-08 LAB — BASIC METABOLIC PANEL
Anion gap: 7 (ref 5–15)
BUN: 7 mg/dL (ref 6–20)
CO2: 43 mmol/L — ABNORMAL HIGH (ref 22–32)
Calcium: 8.6 mg/dL — ABNORMAL LOW (ref 8.9–10.3)
Chloride: 91 mmol/L — ABNORMAL LOW (ref 98–111)
Creatinine, Ser: 0.66 mg/dL (ref 0.44–1.00)
GFR calc Af Amer: >60 mL/min (ref >60)
GFR calc non Af Amer: >60 mL/min (ref >60)
Glucose, Bld: 218 mg/dL — ABNORMAL HIGH (ref 70–99)
Potassium: 3.6 mmol/L (ref 3.5–5.1)
Sodium: 141 mmol/L (ref 135–145)

## 2019-03-08 MED ORDER — ASPIRIN EC 81 MG PO TBEC
81.0000 mg | DELAYED_RELEASE_TABLET | Freq: Every day | ORAL | Status: DC
  Administered 2019-03-08 – 2019-03-12 (×5): 81 mg via ORAL
  Filled 2019-03-08 (×5): qty 1

## 2019-03-08 MED ORDER — CHLORHEXIDINE GLUCONATE CLOTH 2 % EX PADS
6.0000 | MEDICATED_PAD | Freq: Every day | CUTANEOUS | Status: DC
  Administered 2019-03-08 – 2019-03-10 (×2): 6 via TOPICAL

## 2019-03-08 MED ORDER — ORAL CARE MOUTH RINSE
15.0000 mL | Freq: Two times a day (BID) | OROMUCOSAL | Status: DC
  Administered 2019-03-08 – 2019-03-12 (×9): 15 mL via OROMUCOSAL

## 2019-03-08 MED ORDER — LORAZEPAM 2 MG/ML IJ SOLN
0.5000 mg | Freq: Once | INTRAMUSCULAR | Status: AC | PRN
  Administered 2019-03-08: 0.5 mg via INTRAVENOUS
  Filled 2019-03-08: qty 1

## 2019-03-08 MED ORDER — PREDNISONE 20 MG PO TABS
20.0000 mg | ORAL_TABLET | Freq: Every day | ORAL | Status: AC
  Administered 2019-03-08 – 2019-03-12 (×5): 20 mg via ORAL
  Filled 2019-03-08 (×5): qty 1

## 2019-03-08 MED ORDER — PREDNISONE 10 MG PO TABS: 10.0000 mg | ORAL_TABLET | Freq: Every day | ORAL | Status: DC

## 2019-03-08 MED ORDER — LABETALOL HCL 5 MG/ML IV SOLN
10.0000 mg | INTRAVENOUS | Status: DC | PRN
  Administered 2019-03-08: 10 mg via INTRAVENOUS
  Filled 2019-03-08 (×2): qty 4

## 2019-03-08 NOTE — Progress Notes (Signed)
PT Cancellation Note  Patient Details Name: Diane Gilbert MRN: [id number] DOB: 26-Jan-1966   Cancelled Treatment:    Reason Eval/Treat Not Completed: Patient at procedure or test/unavailable. Attempted PT eval-pt getting MRI on 1st attempt. Pt eating lunch on 2nd attempt.Will check back on tomorrow.   Weston Anna, PT Acute Rehabilitation Services Pager: [phone] Office: [phone]

## 2019-03-08 NOTE — Progress Notes (Deleted)
[facility] Progress Note  Patient: Diane Gilbert OJJ:[id number]   PCP: Nolene Ebbs, MD DOB: 06-12-1966   DOA: 03/07/2019   DOS: 03/08/2019   Date of Service: the patient was seen and examined on 03/08/2019  Brief hospital course: Pt. with PMH of  schizophrenia, hypertension, COPD, interstitial lung disease, chronic hypoxic and hypercarbic respiratory failure, and chronic diastolic CHF; presented with complain of confusion and wekaness, was found to have acute on chronic diastolic CHF with hypoxia and hypercapnia.  Currently further plan is rule out stroke and treat CHF.  Subjective: Still remains hypoxic. Uses 4 L at baseline currently on 6 L. Denies any nausea or vomiting. Reported right upper extremity nodule.  Assessment and Plan: 1. Acute metabolic encephalopathy  Presents with confusion, gen weakness, and falls, is found to be hypoxic in 50's on EMS arrival, pCO2 is 106 on VBG in ED, and head CT reveals a left occipital infarction that appears subacute to chronic  Hypercapnia and hypoxia seem to be most likely etiologies   Unremarkable MRI brain - Continue stepdown monitoring. - Address respiratory etiology as below with diuresis, inhalers, and BiPAP as tolerated - continue psychotropic meds   2. Acute on chronic diastolic CHF;  acute on chronic hypoxic and hypercarbic respiratory failure  Uses 4LPM oxygen at home.  She was found to be saturating 50's at home but oxygen tubing had been kinked and not delivering any oxygen, sats remain 90's on her usual 4 Lpm in ED. VBG with pCO2 of 106. There is no leg swelling noted but she has rales on auscultation and CXR findings suggests mild pulm edema  Recently admitted to the hospital about a week ago with acute on chronic respiratory failure after having run out of supplemental oxygen at home SARS-CoV-2 negative at this time. - Diurese with Lasix 40 mg IV q12h, continue losartan, follow daily wt and I/O's    3. OSA. ILD.  Sleep study 2015 was positive for OSA.  Patient noncompliant with CPAP. Follows up with [place] pulmonary for RB ILD. Takes 10 mg prednisone daily. - Currently on 20 mg prednisone daily.  Monitor.  4. Schizophrenia, depression, anxiety Following with [facility] psychiatry  Continue home regimen of Abilify, Wellbutrin, Seroquel, trazodone  6.  History of ischemic stroke  - Left occipital infarction noted on CT, reviewed by neurology who suspects it to be chronic and recommends non-emergent MRI brain with formal consultation if the CVA appear subacute or acute on MRI   - Negative MRI brain for acute stroke, continue statin    7.  Essential hypertension. Blood pressure well controlled. Continue on losartan 50 mg daily, Norvasc 5 mg daily is currently on hold.  8.  Hyperlipidemia. Continue Crestor.  9.  Substance abuse. Patient infrequently test is positive for cocaine. Not checked During this admission Monitor  10.  Chronic back pain. Patient is on gabapentin 300 mg 3 times daily. For now continue.  11.  Accidental drug ingestion. Due to her confusion, Pt reports taking twice the prescribed dose of: seroquel, trazadone, prednisone. Currently we will monitor.  Continue telemetry.  Diet: Cardiac diet  DVT Prophylaxis: Subcutaneous Lovenox  Advance goals of care discussion: full code  Family Communication: no family was present at bedside, at the time of interview.  Disposition:  Discharge to be determined .  Consultants: none Procedures: Bipap  Scheduled Meds: . ARIPiprazole  5 mg Oral Daily  . buPROPion  150 mg Oral Daily  . Chlorhexidine Gluconate Cloth  6 each  Topical Daily  . enoxaparin (LOVENOX) injection  40 mg Subcutaneous QHS  . famotidine  20 mg Oral QHS  . furosemide  40 mg Intravenous BID  . gabapentin  300 mg Oral TID  . losartan  50 mg Oral Daily  . mouth rinse  15 mL Mouth Rinse BID  . mometasone-formoterol  2 puff Inhalation BID  . predniSONE  20 mg  Oral Q breakfast   Followed by  . [START ON 03/13/2019] predniSONE  10 mg Oral Q breakfast  . QUEtiapine  100 mg Oral q morning - 10a  . QUEtiapine  200 mg Oral QHS  . rosuvastatin  10 mg Oral q1800  . sodium chloride flush  3 mL Intravenous Q12H  . sodium chloride flush  3 mL Intravenous Q12H  . traZODone  100 mg Oral QHS   Continuous Infusions: . sodium chloride     PRN Meds: sodium chloride, acetaminophen **OR** acetaminophen, albuterol, ibuprofen, labetalol, nicotine polacrilex, ondansetron **OR** ondansetron (ZOFRAN) IV, polyethylene glycol, sodium chloride flush Antibiotics: Anti-infectives (From admission, onward)   None       Objective: Physical Exam: Vitals:   03/08/19 0300 03/08/19 0359 03/08/19 0500 03/08/19 0600  BP: (!) 117/59  133/74 (!) 129/91  Pulse: 80  81 82  Resp:      Temp:  97.8 F (36.6 C)    TempSrc:  Oral    SpO2: 91%  (!) 89% (!) 88%  Weight:      Height:        Intake/Output Summary (Last 24 hours) at 03/08/2019 0634 Last data filed at 03/08/2019 0624 Gross per 24 hour  Intake 476 ml  Output 1550 ml  Net -1074 ml   Filed Weights   03/07/19 2300 03/08/19 0209  Weight: 102.6 kg 102.6 kg   General: alert and oriented to time, place, and person. Appear in mild distress, affect appropriate Eyes: PERRL, Conjunctiva normal ENT: Oral Mucosa Clear, moist  Neck: difficult to assess  JVD, no Abnormal Mass Or lumps Cardiovascular: S1 and S2 Present, no Murmur, peripheral pulses symmetrical Respiratory: increased respiratory effort, Bilateral Air entry equal and Decreased, no signs of accessory muscle use, bilateral  Crackles, no wheezes Abdomen: Bowel Sound present, Soft and no tenderness, no hernia Skin: no ecchymoses  Extremities: bilateral  Pedal edema, no calf tenderness Neurologic: without any new focal findings, mental status, alert and oriented x3, speech normal, PERLA, Motor strength 5/5 and symmetric and Sensation grossly normal to light touch  Gait not checked due to patient safety concerns  Data Reviewed: CBC: Recent Labs  Lab 03/07/19 1809 03/08/19 0201  WBC 9.6 9.7  NEUTROABS 6.7  --   HGB 16.4* 16.3*  HCT 63.8* 63.8*  MCV 89.4 89.1  PLT 197 631   Basic Metabolic Panel: Recent Labs  Lab 03/07/19 1809 03/08/19 0201  NA 144 141  K 4.0 3.6  CL 93* 91*  CO2 41* 43*  GLUCOSE 136* 218*  BUN 8 7  CREATININE 0.62 0.66  CALCIUM 8.6* 8.6*    Liver Function Tests: Recent Labs  Lab 03/07/19 1809  AST 17  ALT 23  ALKPHOS 60  BILITOT 0.7  PROT 6.2*  ALBUMIN 3.2*   No results for input(s): LIPASE, AMYLASE in the last 168 hours. No results for input(s): AMMONIA in the last 168 hours. Coagulation Profile: No results for input(s): INR, PROTIME in the last 168 hours. Cardiac Enzymes: No results for input(s): CKTOTAL, CKMB, CKMBINDEX, TROPONINI in the last 168 hours. BNP (  last 3 results) No results for input(s): PROBNP in the last 8760 hours. CBG: No results for input(s): GLUCAP in the last 168 hours. Studies: Dg Chest 2 View  Result Date: 03/07/2019 CLINICAL DATA:  Shortness of breath EXAM: CHEST - 2 VIEW COMPARISON:  02/22/2019 FINDINGS: Cardiomegaly with vascular congestion. Diffuse interstitial and alveolar opacities concerning for edema. No effusions. No acute bony abnormality. IMPRESSION: Cardiomegaly with vascular congestion and concern for mild pulmonary edema. Electronically Signed   By: Rolm Baptise M.D.   On: 03/07/2019 19:00   Dg Ankle Complete Right  Result Date: 03/07/2019 CLINICAL DATA:  Fall with ankle pain EXAM: RIGHT ANKLE - COMPLETE 3+ VIEW COMPARISON:  None. FINDINGS: No fracture or malalignment. Ankle mortise is symmetric. Soft tissues are unremarkable. IMPRESSION: No acute osseous abnormality Electronically Signed   By: Donavan Foil M.D.   On: 03/07/2019 19:01   Ct Head Wo Contrast  Result Date: 03/07/2019 CLINICAL DATA:  Fall 3 times today. EXAM: CT HEAD WITHOUT CONTRAST TECHNIQUE:  Contiguous axial images were obtained from the base of the skull through the vertex without intravenous contrast. COMPARISON:  None. FINDINGS: Brain: Infarcts noted in the left occipital lobe which appears subacute to chronic. No acute intracranial abnormality. Specifically, no hemorrhage, hydrocephalus, mass lesion, acute infarction, or significant intracranial injury. Mild chronic small vessel disease throughout the deep white matter. Vascular: No hyperdense vessel or unexpected calcification. Skull: No acute calvarial abnormality. Sinuses/Orbits: Visualized paranasal sinuses and mastoids clear. Orbital soft tissues unremarkable. 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 Chronic small vessel disease throughout the deep white matter. Electronically Signed   By: Rolm Baptise M.D.   On: 03/07/2019 19:36     Time spent: 35 minutes  Author: Berle Mull, MD [facility] 03/08/2019 6:34 AM  To reach On-call, see care teams to locate the attending and reach out to them via [email or web address]. If 7PM-7AM, please contact night-coverage If you still have difficulty reaching the attending provider, please page the [facility] (Director on Call) for [facility] on amion for assistance.

## 2019-03-08 NOTE — Plan of Care (Signed)
  Problem: Education: Goal: Knowledge of General Education information will improve Description: Including pain rating scale, medication(s)/side effects and non-pharmacologic comfort measures Outcome: Progressing

## 2019-03-08 NOTE — Progress Notes (Signed)
[facility] Progress Note  Patient: Diane Gilbert OZH:[id number]   PCP: Nolene Ebbs, MD DOB: May 30, 1966   DOA: 03/07/2019   DOS: 03/08/2019   Date of Service: the patient was seen and examined on 03/08/2019  Brief hospital course: Pt. with PMH of  schizophrenia, hypertension, COPD, interstitial lung disease, chronic hypoxic and hypercarbic respiratory failure, and chronic diastolic CHF; presented with complain of confusion and wekaness, was found to have acute on chronic diastolic CHF with hypoxia and hypercapnia.  Currently further plan is rule out stroke and treat CHF.  Subjective: Still remains hypoxic. Uses 4 L at baseline currently on 6 L. Denies any nausea or vomiting. Reported right upper extremity nodule.  Assessment and Plan: 1. Acute metabolic encephalopathy  Presents with confusion, gen weakness, and falls, is found to be hypoxic in 50's on EMS arrival, pCO2 is 106 on VBG in ED, and head CT reveals a left occipital infarction that appears subacute to chronic  Hypercapnia and hypoxia seem to be most likely etiologies   Unremarkable MRI brain - Continue stepdown monitoring. - Address respiratory etiology as below with diuresis, inhalers, and BiPAP as tolerated - continue psychotropic meds   2. Acute on chronic diastolic CHF;  acute on chronic hypoxic and hypercarbic respiratory failure  Uses 4LPM oxygen at home.  She was found to be saturating 50's at home but oxygen tubing had been kinked and not delivering any oxygen, sats remain 90's on her usual 4 Lpm in ED. VBG with pCO2 of 106. There is no leg swelling noted but she has rales on auscultation and CXR findings suggests mild pulm edema  Recently admitted to the hospital about a week ago with acute on chronic respiratory failure after having run out of supplemental oxygen at home SARS-CoV-2 negative at this time. - Diurese with Lasix 40 mg IV q12h, continue losartan, follow daily wt and I/O's    3.  OSA. ILD. Sleep study 2015 was positive for OSA.  Patient noncompliant with CPAP. Follows up with [facility] for RB ILD. Takes 10 mg prednisone daily. - Currently on 20 mg prednisone daily.  Monitor. Patient unable to tolerate CPAP due to claustrophobia.  Tells me that it makes her hyperventilate. - Continue nocturnal oxygen.  4. Schizophrenia, depression, anxiety Following with [facility] psychiatry  Continue home regimen of Abilify, Wellbutrin, Seroquel, trazodone  6.  History of ischemic stroke  - Left occipital infarction noted on CT, reviewed by neurology who suspects it to be chronic and recommends non-emergent MRI brain with formal consultation if the CVA appear subacute or acute on MRI  Used Ativan for claustrophobia.  - Negative MRI brain for acute stroke, continue statin    7.  Essential hypertension. Blood pressure well controlled. Continue on losartan 50 mg daily, Norvasc 5 mg daily is currently on hold.  8.  Hyperlipidemia. Continue Crestor.  9.  Substance abuse. Patient infrequently test is positive for cocaine. Not checked During this admission Monitor  10.  Chronic back pain. Patient is on gabapentin 300 mg 3 times daily. For now continue.  11.  Accidental drug ingestion. Due to her confusion, Pt reports taking twice the prescribed dose of: seroquel, trazadone, prednisone. Currently we will monitor.  Continue telemetry.  Diet: Cardiac diet  DVT Prophylaxis: Subcutaneous Lovenox  Advance goals of care discussion: full code  Family Communication: no family was present at bedside, at the time of interview.  Disposition:  Discharge to be determined .  Consultants: none Procedures: Bipap  Scheduled  Meds:  ARIPiprazole  5 mg Oral Daily   aspirin EC  81 mg Oral Daily   buPROPion  150 mg Oral Daily   Chlorhexidine Gluconate Cloth  6 each Topical Daily   enoxaparin (LOVENOX) injection  40 mg Subcutaneous QHS   famotidine  20 mg Oral QHS    furosemide  40 mg Intravenous BID   gabapentin  300 mg Oral TID   losartan  50 mg Oral Daily   mouth rinse  15 mL Mouth Rinse BID   mometasone-formoterol  2 puff Inhalation BID   predniSONE  20 mg Oral Q breakfast   Followed by   Derrill Memo ON 03/13/2019] predniSONE  10 mg Oral Q breakfast   QUEtiapine  100 mg Oral q morning - 10a   QUEtiapine  200 mg Oral QHS   rosuvastatin  10 mg Oral q1800   sodium chloride flush  3 mL Intravenous Q12H   sodium chloride flush  3 mL Intravenous Q12H   traZODone  100 mg Oral QHS   Continuous Infusions:  sodium chloride     PRN Meds: sodium chloride, acetaminophen **OR** acetaminophen, albuterol, labetalol, nicotine polacrilex, ondansetron **OR** ondansetron (ZOFRAN) IV, polyethylene glycol, sodium chloride flush Antibiotics: Anti-infectives (From admission, onward)   None       Objective: Physical Exam: Vitals:   03/08/19 1000 03/08/19 1100 03/08/19 1200 03/08/19 1700  BP: (!) 157/74 (!) 125/52 (!) 145/78   Pulse: 92 92 93   Resp: (!) 23 19 (!) 26   Temp:    98.5 F (36.9 C)  TempSrc:    Oral  SpO2: 91% 90% 91%   Weight:      Height:        Intake/Output Summary (Last 24 hours) at 03/08/2019 1739 Last data filed at 03/08/2019 1200 Gross per 24 hour  Intake 716 ml  Output 3550 ml  Net -2834 ml   Filed Weights   03/07/19 2300 03/08/19 0209  Weight: 102.6 kg 102.6 kg   General: alert and oriented to time, place, and person. Appear in mild distress, affect appropriate Eyes: PERRL, Conjunctiva normal ENT: Oral Mucosa Clear, moist  Neck: difficult to assess  JVD, no Abnormal Mass Or lumps Cardiovascular: S1 and S2 Present, no Murmur, peripheral pulses symmetrical Respiratory: increased respiratory effort, Bilateral Air entry equal and Decreased, no signs of accessory muscle use, bilateral  Crackles, no wheezes Abdomen: Bowel Sound present, Soft and no tenderness, no hernia Skin: no ecchymoses  Extremities: bilateral  Pedal  edema, no calf tenderness Neurologic: without any new focal findings, mental status, alert and oriented x3, speech normal, PERLA, Motor strength 5/5 and symmetric and Sensation grossly normal to light touch Gait not checked due to patient safety concerns  Data Reviewed: CBC: Recent Labs  Lab 03/07/19 1809 03/08/19 0201  WBC 9.6 9.7  NEUTROABS 6.7  --   HGB 16.4* 16.3*  HCT 63.8* 63.8*  MCV 89.4 89.1  PLT 197 160   Basic Metabolic Panel: Recent Labs  Lab 03/07/19 1809 03/08/19 0201  NA 144 141  K 4.0 3.6  CL 93* 91*  CO2 41* 43*  GLUCOSE 136* 218*  BUN 8 7  CREATININE 0.62 0.66  CALCIUM 8.6* 8.6*    Liver Function Tests: Recent Labs  Lab 03/07/19 1809  AST 17  ALT 23  ALKPHOS 60  BILITOT 0.7  PROT 6.2*  ALBUMIN 3.2*   No results for input(s): LIPASE, AMYLASE in the last 168 hours. No results for input(s): AMMONIA in  the last 168 hours. Coagulation Profile: No results for input(s): INR, PROTIME in the last 168 hours. Cardiac Enzymes: No results for input(s): CKTOTAL, CKMB, CKMBINDEX, TROPONINI in the last 168 hours. BNP (last 3 results) No results for input(s): PROBNP in the last 8760 hours. CBG: Recent Labs  Lab 03/08/19 0731  GLUCAP 108*   Studies: Dg Chest 2 View  Result Date: 03/07/2019 CLINICAL DATA:  Shortness of breath EXAM: CHEST - 2 VIEW COMPARISON:  02/22/2019 FINDINGS: Cardiomegaly with vascular congestion. Diffuse interstitial and alveolar opacities concerning for edema. No effusions. No acute bony abnormality. IMPRESSION: Cardiomegaly with vascular congestion and concern for mild pulmonary edema. Electronically Signed   By: Rolm Baptise M.D.   On: 03/07/2019 19:00   Dg Ankle Complete Right  Result Date: 03/07/2019 CLINICAL DATA:  Fall with ankle pain EXAM: RIGHT ANKLE - COMPLETE 3+ VIEW COMPARISON:  None. FINDINGS: No fracture or malalignment. Ankle mortise is symmetric. Soft tissues are unremarkable. IMPRESSION: No acute osseous abnormality  Electronically Signed   By: Donavan Foil M.D.   On: 03/07/2019 19:01   Ct Head Wo Contrast  Result Date: 03/07/2019 CLINICAL DATA:  Fall 3 times today. EXAM: CT HEAD WITHOUT CONTRAST TECHNIQUE: Contiguous axial images were obtained from the base of the skull through the vertex without intravenous contrast. COMPARISON:  None. FINDINGS: Brain: Infarcts noted in the left occipital lobe which appears subacute to chronic. No acute intracranial abnormality. Specifically, no hemorrhage, hydrocephalus, mass lesion, acute infarction, or significant intracranial injury. Mild chronic small vessel disease throughout the deep white matter. Vascular: No hyperdense vessel or unexpected calcification. Skull: No acute calvarial abnormality. Sinuses/Orbits: Visualized paranasal sinuses and mastoids clear. Orbital soft tissues unremarkable. 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 Chronic small vessel disease throughout the deep white matter. Electronically Signed   By: Rolm Baptise M.D.   On: 03/07/2019 19:36   Mr Brain Wo Contrast  Result Date: 03/08/2019 CLINICAL DATA:  53 year old female with recent falls. Age indeterminate left occipital infarct on head CT yesterday. EXAM: MRI HEAD WITHOUT CONTRAST TECHNIQUE: Multiplanar, multiecho pulse sequences of the brain and surrounding structures were obtained without intravenous contrast. COMPARISON:  Head CT 03/07/2019. FINDINGS: Study is intermittently degraded by motion artifact despite repeated imaging attempts. Brain: Cortical encephalomalacia in the left occipital pole and lateral aspect of the left occipital lobe is associated with facilitated diffusion. No convincing restricted diffusion to suggest acute infarction. Chronic appearing lacunar infarcts in both globus pallidus. Scattered and patchy bilateral cerebral white matter T2 and FLAIR hyperintensity which is moderately advanced for age. Small chronic infarct in the right  cerebellum (series 5, image 7). No other cortical encephalomalacia. No chronic cerebral blood products identified. No midline shift, mass effect, evidence of mass lesion, ventriculomegaly, extra-axial collection or acute intracranial hemorrhage. Cervicomedullary junction and pituitary are within normal limits. Vascular: Major intracranial vascular flow voids are preserved. Skull and upper cervical spine: Grossly negative visible cervical spine. Grossly normal bone marrow signal. Sinuses/Orbits: Negative orbits aside from suggestion of increased intraorbital fat bilaterally. Only trace paranasal sinus mucosal thickening. Other: Mastoids are clear. Scalp and face soft tissues appear negative. IMPRESSION: 1. Intermittently motion degraded exam with no acute intracranial abnormality. 2. Chronic left PCA territory infarct. Chronic small vessel infarcts in the bilateral globus pallidus, the right cerebellum, and probably also likely scattered in the cerebral white matter. 3. 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 Electronically Signed   By:  Genevie Ann M.D.   On: 03/08/2019 15:13     Time spent: 35 minutes  Author: Berle Mull, MD [facility] 03/08/2019 5:39 PM  To reach On-call, see care teams to locate the attending and reach out to them via [email or web address]. If 7PM-7AM, please contact night-coverage If you still have difficulty reaching the attending provider, please page the [facility] (Director on Call) for [facility] on amion for assistance.

## 2019-03-09 LAB — COMPREHENSIVE METABOLIC PANEL
ALT: 18 U/L (ref 0–44)
AST: 14 U/L — ABNORMAL LOW (ref 15–41)
Albumin: 3.1 g/dL — ABNORMAL LOW (ref 3.5–5.0)
Alkaline Phosphatase: 56 U/L (ref 38–126)
Anion gap: 17 — ABNORMAL HIGH (ref 5–15)
BUN: 9 mg/dL (ref 6–20)
CO2: 39 mmol/L — ABNORMAL HIGH (ref 22–32)
Calcium: 8.4 mg/dL — ABNORMAL LOW (ref 8.9–10.3)
Chloride: 89 mmol/L — ABNORMAL LOW (ref 98–111)
Creatinine, Ser: 0.74 mg/dL (ref 0.44–1.00)
GFR calc Af Amer: >60 mL/min (ref >60)
GFR calc non Af Amer: >60 mL/min (ref >60)
Glucose, Bld: 139 mg/dL — ABNORMAL HIGH (ref 70–99)
Potassium: 3.7 mmol/L (ref 3.5–5.1)
Sodium: 145 mmol/L (ref 135–145)
Total Bilirubin: 0.7 mg/dL (ref 0.3–1.2)
Total Protein: 6.3 g/dL — ABNORMAL LOW (ref 6.5–8.1)

## 2019-03-09 LAB — CBC WITH DIFFERENTIAL/PLATELET
Abs Immature Granulocytes: 0.04 10*3/uL (ref 0.00–0.07)
Basophils Absolute: 0 10*3/uL (ref 0.0–0.1)
Basophils Relative: 1 %
Eosinophils Absolute: 0.1 10*3/uL (ref 0.0–0.5)
Eosinophils Relative: 1 %
HCT: 63.6 % — ABNORMAL HIGH (ref 36.0–46.0)
Hemoglobin: 16.4 g/dL — ABNORMAL HIGH (ref 12.0–15.0)
Immature Granulocytes: 1 %
Lymphocytes Relative: 38 %
Lymphs Abs: 3 10*3/uL (ref 0.7–4.0)
MCH: 22.9 pg — ABNORMAL LOW (ref 26.0–34.0)
MCHC: 25.8 g/dL — ABNORMAL LOW (ref 30.0–36.0)
MCV: 88.8 fL (ref 80.0–100.0)
Monocytes Absolute: 0.8 10*3/uL (ref 0.1–1.0)
Monocytes Relative: 10 %
Neutro Abs: 3.9 10*3/uL (ref 1.7–7.7)
Neutrophils Relative %: 49 %
Platelets: 202 10*3/uL (ref 150–400)
RBC: 7.16 MIL/uL — ABNORMAL HIGH (ref 3.87–5.11)
RDW: 22.4 % — ABNORMAL HIGH (ref 11.5–15.5)
WBC: 7.8 10*3/uL (ref 4.0–10.5)
nRBC: 0 % (ref 0.0–0.2)

## 2019-03-09 LAB — GLUCOSE, CAPILLARY: Glucose-Capillary: 155 mg/dL — ABNORMAL HIGH (ref 70–99)

## 2019-03-09 LAB — MAGNESIUM: Magnesium: 1.9 mg/dL (ref 1.7–2.4)

## 2019-03-09 MED ORDER — FUROSEMIDE 10 MG/ML IJ SOLN
40.0000 mg | Freq: Every day | INTRAMUSCULAR | Status: DC
  Administered 2019-03-10: 10:00:00 40 mg via INTRAVENOUS
  Filled 2019-03-09: qty 4

## 2019-03-09 MED ORDER — INFLUENZA VAC SPLIT QUAD 0.5 ML IM SUSY
0.5000 mL | PREFILLED_SYRINGE | INTRAMUSCULAR | Status: AC
  Administered 2019-03-10: 0.5 mL via INTRAMUSCULAR
  Filled 2019-03-09: qty 0.5

## 2019-03-09 NOTE — Evaluation (Signed)
Occupational Therapy Evaluation Patient Details Name: Diane Gilbert MRN: [id number] DOB: 1966/03/01 Today's Date: 03/09/2019    History of Present Illness 53 year old female admitted foracute encephalopathy/AMS.  CT shows chronic vs subacute L occipital infarct. PMH:  schizophrenia, HTN, COPD, intersitial lung disease, CHF and OSA   Clinical Impression   Pt was admitted for the above. At baseline, she lives with a roommate and has an aide 2 hours a day for adls/iadls x 7 days a week.  Pt needs min A for SPT/ambulating at this time and she c/o dizziness/lightheadedness. She states she had this at home also and has had falls. Pt would benefit from SNF or assist for mobility.  Pt does have urgency of bladder. Will follow in acute setting with the goals listed below    Follow Up Recommendations  SNF;Supervision/Assistance - 24 hour    Equipment Recommendations  None recommended by OT    Recommendations for Other Services       Precautions / Restrictions Precautions Precautions: Fall Precaution Comments: pt reports she gets lightheaded prior to falling Restrictions Weight Bearing Restrictions: No      Mobility Bed Mobility                  Transfers   Equipment used: Rolling walker (2 wheeled)(vs hand held assist due to urgency)   Sit to Stand: Min assist         General transfer comment: steadying assist    Balance                                           ADL either performed or assessed with clinical judgement   ADL   Eating/Feeding: Independent   Grooming: Set up                   Toilet Transfer: Minimal assistance;Stand-pivot;BSC;RW   Toileting- Clothing Manipulation and Hygiene: Minimal assistance;Sit to/from stand(hygiene from seated)         General ADL Comments: pt reports aide assists with bathing and dressing daily     Vision         Perception     Praxis      Pertinent Vitals/Pain Pain  Assessment: 0-10 Pain Score: 8  Pain Location: R knee Pain Descriptors / Indicators: Sore Pain Intervention(s): Limited activity within patient's tolerance;Monitored during session;Repositioned     Hand Dominance Right   Extremity/Trunk Assessment Upper Extremity Assessment Upper Extremity Assessment: RUE deficits/detail RUE Deficits / Details: grip weakness; states she hurt ligament in 5th digit.  2nd has weakness in flexion           Communication Communication Communication: No difficulties   Cognition Arousal/Alertness: Awake/alert Behavior During Therapy: Flat affect Overall Cognitive Status: No family/caregiver present to determine baseline cognitive functioning                                 General Comments: PT/OT have to ask several questions in order to get ultimate answer to original question.   General Comments  not orthostatic; see vitals--pt describes lightheadedness at home and while walking. sats in 90s on 4 liters, which is her baseline    Exercises     Shoulder Instructions      Home Living Family/patient expects to be discharged to:: Private residence Living Arrangements:  Other (Comment)(roommate) Available Help at Discharge: Personal care attendant Type of Home: Apartment Home Access: Stairs to enter Entrance Stairs-Number of Steps: 1 Entrance Stairs-Rails: Can reach both Home Layout: One level     Bathroom Shower/Tub: Teacher, early years/pre: Standard     Home Equipment: Environmental consultant - 2 wheels;Bedside commode;Shower seat;Other (comment)          Prior Functioning/Environment Level of Independence: Needs assistance  Gait / Transfers Assistance Needed: pt reports using RW PTA due to falls ADL's / Homemaking Assistance Needed: reports her aide bathes and dresses her and performs housekeeping, pt can take herself to the bathroom   Comments: uses 4L O2 at home        OT Problem List: Impaired balance (sitting and/or  standing);Cardiopulmonary status limiting activity;Pain;Decreased activity tolerance;Decreased strength      OT Treatment/Interventions: Self-care/ADL training;DME and/or AE instruction;Patient/family education;Balance training;Therapeutic activities;Therapeutic exercise    OT Goals(Current goals can be found in the care plan section) Acute Rehab OT Goals Patient Stated Goal: none stated OT Goal Formulation: With patient Time For Goal Achievement: 03/23/19 Potential to Achieve Goals: Good ADL Goals Pt Will Perform Grooming: with supervision;standing Pt Will Transfer to Toilet: with supervision;ambulating;bedside commode Pt Will Perform Toileting - Clothing Manipulation and hygiene: with supervision;sit to/from stand  OT Frequency: Min 2X/week   Barriers to D/C:            Co-evaluation              AM-PAC OT "6 Clicks" Daily Activity     Outcome Measure Help from another person eating meals?: None Help from another person taking care of personal grooming?: A Little Help from another person toileting, which includes using toliet, bedpan, or urinal?: A Little Help from another person bathing (including washing, rinsing, drying)?: A Lot Help from another person to put on and taking off regular upper body clothing?: A Little Help from another person to put on and taking off regular lower body clothing?: A Lot 6 Click Score: 17   End of Session    Activity Tolerance: Patient limited by fatigue Patient left: in chair;with call bell/phone within reach;with chair alarm set  OT Visit Diagnosis: Muscle weakness (generalized) (M62.81);Other abnormalities of gait and mobility (R26.89)                Time: 1250-1320 OT Time Calculation (min): 30 min Charges:  OT General Charges $OT Visit: 1 Visit OT Evaluation $OT Eval Moderate Complexity: [place], OTR/L Acute Rehabilitation Services [phone] [facility] pager [phone]  office 03/09/2019  [place] 03/09/2019, 1:41 PM

## 2019-03-09 NOTE — Evaluation (Signed)
Physical Therapy Evaluation Patient Details Name: Diane Gilbert MRN: [id number] DOB: 1965/07/27 Today's Date: 03/09/2019   History of Present Illness  53 year old female admitted foracute encephalopathy/AMS.  CT shows chronic vs subacute L occipital infarct. PMH:  schizophrenia, HTN, COPD, intersitial lung disease, CHF and OSA  Clinical Impression   Pt presents with LE weakness, increased time and effort to perform mobility tasks, dizziness described as lightheadedness with positional changes but orthostatics negative, decreased activity tolerance due to dizziness. Pt to benefit from acute PT to address deficits. Pt ambulated 15 ft with RW with min assist +2, requiring sit in recliner after short distance ambulation due to dizziness. BP and HR stable and WFL, see orthostatics section in flowsheets. PT recommending SNF level of care due to pt history of multiple falls, decreased caregiver support at home, and mobility deficits. PT to progress mobility as tolerated, and will continue to follow acutely.      Follow Up Recommendations SNF    Equipment Recommendations  None recommended by PT    Recommendations for Other Services       Precautions / Restrictions Precautions Precautions: Fall Precaution Comments: pt reports she gets lightheaded prior to falling Restrictions Weight Bearing Restrictions: No      Mobility  Bed Mobility Overal bed mobility: Needs Assistance Bed Mobility: Supine to Sit     Supine to sit: Supervision;HOB elevated     General bed mobility comments: supervision for safety, increasd time and effort to perform.  Transfers Overall transfer level: Needs assistance Equipment used: Rolling walker (2 wheeled) Transfers: Sit to/from [facility] Sit to Stand: Min assist Stand pivot transfers: Min assist;+2 safety/equipment       General transfer comment: Min assist for power up, steadying. Sit to stand x4, once from bed, twice from  [facility], and once from recliner. Min assist for stand pivot for steadying, guiding pt to destination, and slow lowering onto toilet.  Ambulation/Gait Ambulation/Gait assistance: Min assist;+2 safety/equipment(O2, chair) Gait Distance (Feet): 15 Feet Assistive device: Rolling walker (2 wheeled) Gait Pattern/deviations: Step-through pattern;Decreased stride length;Trunk flexed;Drifts right/left Gait velocity: decr   General Gait Details: Min assist for steadying, Pt complaining of lightheadedness after 15 ft ambulation requiring chair follow sit. BP and HR stable vs baseline.  Stairs            Wheelchair Mobility    Modified Rankin (Stroke Patients Only)       Balance Overall balance assessment: Needs assistance;History of Falls Sitting-balance support: No upper extremity supported;Feet supported Sitting balance-Leahy Scale: Good     Standing balance support: Bilateral upper extremity supported Standing balance-Leahy Scale: Poor Standing balance comment: reliant on external support in dynamic standing; able to stand without PT support                             Pertinent Vitals/Pain Pain Assessment: 0-10 Pain Score: 8  Pain Location: R knee Pain Descriptors / Indicators: Sore Pain Intervention(s): Limited activity within patient's tolerance;Monitored during session;Repositioned    Home Living Family/patient expects to be discharged to:: Private residence Living Arrangements: Other (Comment)(roommate) Available Help at Discharge: Personal care attendant Type of Home: Apartment Home Access: Stairs to enter Entrance Stairs-Rails: Can reach both Entrance Stairs-Number of Steps: 1 Home Layout: One level Home Equipment: Walker - 2 wheels;Bedside commode;Shower seat;Other (comment)      Prior Function Level of Independence: Needs assistance   Gait / Transfers Assistance Needed: pt reports  using RW PTA due to falls  ADL's / Homemaking Assistance Needed:  reports her aide bathes and dresses her and performs housekeeping, pt can take herself to the bathroom  Comments: uses 4L O2 at home     Hand Dominance   Dominant Hand: Right    Extremity/Trunk Assessment   Upper Extremity Assessment Upper Extremity Assessment: Defer to OT evaluation RUE Deficits / Details: grip weakness; states she hurt ligament in 5th digit.  2nd has weakness in flexion    Lower Extremity Assessment Lower Extremity Assessment: Generalized weakness    Cervical / Trunk Assessment Cervical / Trunk Assessment: Normal  Communication   Communication: No difficulties  Cognition Arousal/Alertness: Awake/alert Behavior During Therapy: Flat affect Overall Cognitive Status: No family/caregiver present to determine baseline cognitive functioning                                 General Comments: PT/OT have to ask several questions in order to get ultimate answer to original question.      General Comments General comments (skin integrity, edema, etc.): vitals stable, orthostatic vitals negative. See flowsheets. Sats 98-99% on 4LO2 which is pt baseline    Exercises     Assessment/Plan    PT Assessment Patient needs continued PT services  PT Problem List Decreased strength;Decreased mobility;Decreased safety awareness;Decreased activity tolerance;Decreased balance;Decreased knowledge of use of DME;Pain       PT Treatment Interventions DME instruction;Therapeutic activities;Gait training;Therapeutic exercise;Patient/family education;Balance training;Functional mobility training    PT Goals (Current goals can be found in the Care Plan section)  Acute Rehab PT Goals Patient Stated Goal: stop feeling dizzy PT Goal Formulation: With patient Time For Goal Achievement: 03/23/19 Potential to Achieve Goals: Good    Frequency Min 2X/week   Barriers to discharge        Co-evaluation               AM-PAC PT "6 Clicks" Mobility  Outcome  Measure Help needed turning from your back to your side while in a flat bed without using bedrails?: None Help needed moving from lying on your back to sitting on the side of a flat bed without using bedrails?: A Little Help needed moving to and from a bed to a chair (including a wheelchair)?: A Little Help needed standing up from a chair using your arms (e.g., wheelchair or bedside chair)?: A Little Help needed to walk in hospital room?: A Little Help needed climbing 3-5 steps with a railing? : A Lot 6 Click Score: 18    End of Session Equipment Utilized During Treatment: Gait belt;Oxygen Activity Tolerance: Patient limited by fatigue;Other (comment)(lightheadedness) Patient left: in chair;with call bell/phone within reach;with chair alarm set Nurse Communication: Mobility status;Other (comment)(told NT that pt needs purewick replaced per pt request) PT Visit Diagnosis: Other abnormalities of gait and mobility (R26.89);Dizziness and giddiness (R42);Muscle weakness (generalized) (M62.81);History of falling (Z91.81)    Time: 8864-8472 PT Time Calculation (min) (ACUTE ONLY): 30 min   Charges:   PT Evaluation $PT Eval Low Complexity: 1 Low         Burnham Trost Conception Chancy, PT Acute Rehabilitation Services Pager [phone]  Office [phone]   Elvina Bosch D Maxene Byington 03/09/2019, 1:51 PM

## 2019-03-09 NOTE — TOC Initial Note (Signed)
Transition of Care [facility]) - Initial/Assessment Note    Patient Details  Name: Diane Gilbert MRN: [id number] Date of Birth: [birth year]  Transition of Care [facility]) CM/SW Contact:    Purcell Mouton, RN Phone Number: 03/09/2019, 11:05 AM  Clinical Narrative:                 Pt from Home with PCA from [facility] and plan to return.    Expected Discharge Plan: [place]     Patient Goals and CMS Choice Patient states their goals for this hospitalization and ongoing recovery are:: Plan to return home with [facility] for PCA.   Choice offered to / list presented to : Patient  Expected Discharge Plan and Services Expected Discharge Plan: [place]   Discharge Planning Services: CM Consult Post Acute Care Choice: [place] arrangements for the past 2 months: Single Family Home                           HH Arranged: PCS/Personal Care Services          Prior Living Arrangements/Services Living arrangements for the past 2 months: Single Family Home Lives with:: Self Patient language and need for interpreter reviewed:: No Do you feel safe going back to the place where you live?: Yes          Current home services: Other (comment)(PCA with [facility])    Activities of Daily Living Home Assistive Devices/Equipment: None ADL Screening (condition at time of admission) Patient's cognitive ability adequate to safely complete daily activities?: Yes Is the patient deaf or have difficulty hearing?: No Does the patient have difficulty seeing, even when wearing glasses/contacts?: No Does the patient have difficulty concentrating, remembering, or making decisions?: No Patient able to express need for assistance with ADLs?: Yes Does the patient have difficulty dressing or bathing?: No Independently performs ADLs?: Yes (appropriate for developmental age) Does the patient have difficulty walking or  climbing stairs?: No Weakness of Legs: None Weakness of Arms/Hands: None  Permission Sought/Granted Permission sought to share information with : Case Manager Permission granted to share information with : Yes, Release of Information Signed              Emotional Assessment Appearance:: Appears stated age   Affect (typically observed): Accepting Orientation: : Oriented to Self, Oriented to Place, Oriented to  Time, Oriented to Situation      Admission diagnosis:  Shortness of breath [R06.02] Abnormal CT of the head [R93.0] Patient Active Problem List   Diagnosis Date Noted  . Acute diastolic CHF (congestive heart failure) ([place]) 03/08/2019  . Acute encephalopathy 03/07/2019  . Ischemic stroke ([place]) 03/07/2019  . Acute respiratory failure with hypoxia ([place]) 02/26/2019  . Falls 02/06/2019  . Depression 02/06/2019  . Acute on chronic diastolic CHF (congestive heart failure) ([place]) 05/09/2017  . Acute on chronic respiratory failure with hypoxia and hypercapnia ([place]) 05/09/2017  . Facial swelling 03/23/2017  . Chronic respiratory failure with hypoxia ([place]) 03/23/2017  . Acute diastolic congestive heart failure ([place])   . Schizophrenia ([place]) 03/24/2016  . Leg swelling 01/16/2016  . Interstitial lung disease ([place]) 07/16/2014  . Respiratory bronchiolitis interstitial lung disease ([place]) 12/26/2013  . Irregular menstrual cycle 01/23/2013  . Smoking addiction 01/23/2013  . Alcohol abuse 02/19/2012  . Hyponatremia 02/19/2012  . Abdominal pain 02/19/2012  . Hypokalemia 02/19/2012   PCP:  Nolene Ebbs,  MD Pharmacy:   [facility], [place] [address] Phone: [phone] Fax: [place], [place] [address] Phone: [phone] Fax: [phone]     Social Determinants of Health (SDOH) Interventions     Readmission Risk Interventions No flowsheet data found.

## 2019-03-09 NOTE — Progress Notes (Signed)
Patient's brother Nira Conn [phone] called and has concerns about discharge. He would like her to be sent to [place] so she will have assistance since she lives alone in [place]. Will let case manager know.

## 2019-03-09 NOTE — Progress Notes (Addendum)
[facility] Progress Note  Patient: Diane Gilbert ZOX:[id number]   PCP: Nolene Ebbs, MD DOB: September 01, 1965   DOA: 03/07/2019   DOS: 03/09/2019   Date of Service: the patient was seen and examined on 03/09/2019  Brief hospital course: Pt. with PMH of  schizophrenia, hypertension, COPD, interstitial lung disease, chronic hypoxic and hypercarbic respiratory failure, and chronic diastolic CHF; presented with complain of confusion and wekaness, was found to have acute on chronic diastolic CHF with hypoxia and hypercapnia.  Currently further plan is to optimize CHF.  Subjective: Oxygenation improving.  Breathing improving.  No acute events overnight.  Unable to sleep last night.  No nausea no vomiting.  No chest pain abdominal pain.  No diarrhea.  No confusion.  No focal deficit reported.  Assessment and Plan: 1. Acute metabolic encephalopathy  Presents with confusion, gen weakness, and falls, is found to be hypoxic in 50's on EMS arrival, pCO2 is 106 on VBG in ED, and head CT reveals a left occipital infarction that appears subacute to chronic  Hypercapnia and hypoxia seem to be most likely etiologies   Unremarkable MRI brain Transfer out of the stepdown. Mentation significantly improving although not back to baseline. We will get PT OT evaluation. Continue psychotropic medication.  2. Acute on chronic diastolic CHF;  acute on chronic hypoxic and hypercarbic respiratory failure  Uses 4LPM oxygen at home.  She was found to be saturating 50's at home but oxygen tubing had been kinked and not delivering any oxygen, sats remain 90's on her usual 4 Lpm in ED. VBG with pCO2 of 106. There is no leg swelling noted but she has rales on auscultation and CXR findings suggests mild pulm edema  Recently admitted to the hospital about a week ago with acute on chronic respiratory failure after having run out of supplemental oxygen at home SARS-CoV-2 negative at this time. Oxygenation improving  back to baseline at rest. We will continue Lasix, 40 mg daily. Daily ins and outs. Transition to oral on 03/10/2019. Continue losartan.  3. OSA. ILD. Sleep study 2015 was positive for OSA.  Patient noncompliant with CPAP. Follows up with [place] [facility] for RB ILD. Takes 10 mg prednisone daily. Patient unable to tolerate CPAP due to claustrophobia.  Tells me that it makes her hyperventilate. Continue current taper of 20 mg daily prednisone. Continue nocturnal oxygen. Patient will benefit from outpatient sleep study with nasal pillow. Recommended to discuss with pulmonary on follow-up.  4. Schizophrenia, depression, anxiety Following with [facility] psychiat[facility]  Continue home regimen of Abilify, Wellbutrin, Seroquel and trazodone.  6.  History of ischemic stroke  Left occipital infarction noted on CT, reviewed by neurology who suspects it to be chronic and recommends non-emergent MRI brain with formal consultation if the CVA appear subacute or acute on MRI  - MRI brain negative for any acute stroke, chronic left PCA territory infarct. - Continue 81 mg aspirin and statin.  7.  Essential hypertension. Blood pressure somewhat poorly controlled. Continue losartan 50 mg daily. Resume Norvasc 5 mg daily.  8.  Hyperlipidemia. Continue Crestor  9.  Substance abuse. Patient occasionally tests positive for cocaine. Currently no evidence of withdrawal. UDS not checked during this admission.  10.  Chronic back pain. Patient is on gabapentin 300 mg 3 times daily. Continue.  Pain well controlled.  11.  Accidental drug ingestion. Due to her confusion, Pt reports taking twice the prescribed dose of: seroquel, trazadone, prednisone. Monitored on telemetry and in stepdown.  No evidence of  acute abnormality.  Patient nearing back to baseline in terms of the mentation.  Diet: Cardiac diet  DVT Prophylaxis: Subcutaneous Lovenox  Advance goals of care discussion: full code  Family  Communication: no family was present at bedside, at the time of interview.  Patient requested to call brother.  Disposition:  Discharge to be determined, PT consulted.  Consultants: none Procedures: Bipap  Scheduled Meds: . ARIPiprazole  5 mg Oral Daily  . aspirin EC  81 mg Oral Daily  . buPROPion  150 mg Oral Daily  . Chlorhexidine Gluconate Cloth  6 each Topical Daily  . enoxaparin (LOVENOX) injection  40 mg Subcutaneous QHS  . famotidine  20 mg Oral QHS  . furosemide  40 mg Intravenous BID  . gabapentin  300 mg Oral TID  . losartan  50 mg Oral Daily  . mouth rinse  15 mL Mouth Rinse BID  . mometasone-formoterol  2 puff Inhalation BID  . predniSONE  20 mg Oral Q breakfast   Followed by  . [START ON 03/13/2019] predniSONE  10 mg Oral Q breakfast  . QUEtiapine  100 mg Oral q morning - 10a  . QUEtiapine  200 mg Oral QHS  . rosuvastatin  10 mg Oral q1800  . sodium chloride flush  3 mL Intravenous Q12H  . sodium chloride flush  3 mL Intravenous Q12H  . traZODone  100 mg Oral QHS   Continuous Infusions: . sodium chloride     PRN Meds: sodium chloride, acetaminophen **OR** acetaminophen, albuterol, labetalol, nicotine polacrilex, ondansetron **OR** ondansetron (ZOFRAN) IV, polyethylene glycol, sodium chloride flush Antibiotics: Anti-infectives (From admission, onward)   None       Objective: Physical Exam: Vitals:   03/09/19 0500 03/09/19 0700 03/09/19 0800 03/09/19 0922  BP: 139/77 140/63 (!) 155/73   Pulse: 88 88 94   Resp: 17 (!) 23 (!) 22   Temp:   98 F (36.7 C)   TempSrc:   Oral   SpO2: 96% 95% 96% 92%  Weight:      Height:        Intake/Output Summary (Last 24 hours) at 03/09/2019 0928 Last data filed at 03/09/2019 0800 Gross per 24 hour  Intake 978 ml  Output 3000 ml  Net -2022 ml   Filed Weights   03/07/19 2300 03/08/19 0209  Weight: 102.6 kg 102.6 kg  General: alert and oriented to time, place, and person. Appear in mild distress, affect appropriate  Eyes: PERRL, Conjunctiva normal ENT: Oral Mucosa Clear, moist  Neck: difficult to assess  JVD, no Abnormal Mass Or lumps Cardiovascular: S1 and S2 Present, no Murmur, peripheral pulses symmetrical Respiratory: good respiratory effort, Bilateral Air entry equal and Decreased, no signs of accessory muscle use, bilateral basal Crackles, no wheezes Abdomen: Bowel Sound present, Soft and no tenderness, no hernia Skin: no rashes  Extremities: trace Pedal edema, no calf tenderness Neurologic: without any new focal findings Gait not checked due to patient safety concerns   Data Reviewed: CBC: Recent Labs  Lab 03/07/19 1809 03/08/19 0201 03/09/19 0212  WBC 9.6 9.7 7.8  NEUTROABS 6.7  --  3.9  HGB 16.4* 16.3* 16.4*  HCT 63.8* 63.8* 63.6*  MCV 89.4 89.1 88.8  PLT 197 184 366   Basic Metabolic Panel: Recent Labs  Lab 03/07/19 1809 03/08/19 0201 03/09/19 0212  NA 144 141 145  K 4.0 3.6 3.7  CL 93* 91* 89*  CO2 41* 43* 39*  GLUCOSE 136* 218* 139*  BUN 8 7  9  CREATININE 0.62 0.66 0.74  CALCIUM 8.6* 8.6* 8.4*  MG  --   --  1.9    Liver Function Tests: Recent Labs  Lab 03/07/19 1809 03/09/19 0212  AST 17 14*  ALT 23 18  ALKPHOS 60 56  BILITOT 0.7 0.7  PROT 6.2* 6.3*  ALBUMIN 3.2* 3.1*   No results for input(s): LIPASE, AMYLASE in the last 168 hours. No results for input(s): AMMONIA in the last 168 hours. Coagulation Profile: No results for input(s): INR, PROTIME in the last 168 hours. Cardiac Enzymes: No results for input(s): CKTOTAL, CKMB, CKMBINDEX, TROPONINI in the last 168 hours. BNP (last 3 results) No results for input(s): PROBNP in the last 8760 hours. CBG: Recent Labs  Lab 03/08/19 0731  GLUCAP 108*   Studies: Mr Brain Wo Contrast  Result Date: 03/08/2019 CLINICAL DATA:  53 year old female with recent falls. Age indeterminate left occipital infarct on head CT yesterday. EXAM: MRI HEAD WITHOUT CONTRAST TECHNIQUE: Multiplanar, multiecho pulse sequences of  the brain and surrounding structures were obtained without intravenous contrast. COMPARISON:  Head CT 03/07/2019. FINDINGS: Study is intermittently degraded by motion artifact despite repeated imaging attempts. Brain: Cortical encephalomalacia in the left occipital pole and lateral aspect of the left occipital lobe is associated with facilitated diffusion. No convincing restricted diffusion to suggest acute infarction. Chronic appearing lacunar infarcts in both globus pallidus. Scattered and patchy bilateral cerebral white matter T2 and FLAIR hyperintensity which is moderately advanced for age. Small chronic infarct in the right cerebellum (series 5, image 7). No other cortical encephalomalacia. No chronic cerebral blood products identified. No midline shift, mass effect, evidence of mass lesion, ventriculomegaly, extra-axial collection or acute intracranial hemorrhage. Cervicomedullary junction and pituitary are within normal limits. Vascular: Major intracranial vascular flow voids are preserved. Skull and upper cervical spine: Grossly negative visible cervical spine. Grossly normal bone marrow signal. Sinuses/Orbits: Negative orbits aside from suggestion of increased intraorbital fat bilaterally. Only trace paranasal sinus mucosal thickening. Other: Mastoids are clear. Scalp and face soft tissues appear negative. IMPRESSION: 1. Intermittently motion degraded exam with no acute intracranial abnormality. 2. Chronic left PCA territory infarct. Chronic small vessel infarcts in the bilateral globus pallidus, the right cerebellum, and probably also likely scattered in the cerebral white matter. 3. 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 Electronically Signed   By: Genevie Ann M.D.   On: 03/08/2019 15:13     Time spent: 35 minutes  Author: Berle Mull, MD [facility] 03/09/2019 9:28 AM  To reach On-call, see care teams to locate the attending and  reach out to them via [email or web address]. If 7PM-7AM, please contact night-coverage If you still have difficulty reaching the attending provider, please page the [facility] (Director on Call) for [facility] on amion for assistance.

## 2019-03-10 LAB — BASIC METABOLIC PANEL
Anion gap: 11 (ref 5–15)
BUN: 10 mg/dL (ref 6–20)
CO2: 43 mmol/L — ABNORMAL HIGH (ref 22–32)
Calcium: 9.4 mg/dL (ref 8.9–10.3)
Chloride: 90 mmol/L — ABNORMAL LOW (ref 98–111)
Creatinine, Ser: 0.6 mg/dL (ref 0.44–1.00)
GFR calc Af Amer: >60 mL/min (ref >60)
GFR calc non Af Amer: >60 mL/min (ref >60)
Glucose, Bld: 106 mg/dL — ABNORMAL HIGH (ref 70–99)
Potassium: 3.5 mmol/L (ref 3.5–5.1)
Sodium: 144 mmol/L (ref 135–145)

## 2019-03-10 LAB — CBC
HCT: 66.2 % — ABNORMAL HIGH (ref 36.0–46.0)
Hemoglobin: 17.3 g/dL — ABNORMAL HIGH (ref 12.0–15.0)
MCH: 23.1 pg — ABNORMAL LOW (ref 26.0–34.0)
MCHC: 26.1 g/dL — ABNORMAL LOW (ref 30.0–36.0)
MCV: 88.4 fL (ref 80.0–100.0)
Platelets: 183 10*3/uL (ref 150–400)
RBC: 7.49 MIL/uL — ABNORMAL HIGH (ref 3.87–5.11)
RDW: 22.4 % — ABNORMAL HIGH (ref 11.5–15.5)
WBC: 10.3 10*3/uL (ref 4.0–10.5)
nRBC: 0 % (ref 0.0–0.2)

## 2019-03-10 LAB — MAGNESIUM: Magnesium: 1.9 mg/dL (ref 1.7–2.4)

## 2019-03-10 LAB — GLUCOSE, CAPILLARY: Glucose-Capillary: 100 mg/dL — ABNORMAL HIGH (ref 70–99)

## 2019-03-10 MED ORDER — FUROSEMIDE 40 MG PO TABS
40.0000 mg | ORAL_TABLET | Freq: Two times a day (BID) | ORAL | Status: DC
  Administered 2019-03-10 – 2019-03-12 (×4): 40 mg via ORAL
  Filled 2019-03-10 (×4): qty 1

## 2019-03-10 NOTE — Progress Notes (Signed)
[facility] Progress Note  Patient: Diane Gilbert DJS:[id number]   PCP: Nolene Ebbs, MD DOB: August 26, 1965   DOA: 03/07/2019   DOS: 03/10/2019   Date of Service: the patient was seen and examined on 03/10/2019  Brief hospital course: Pt. with PMH of  schizophrenia, hypertension, COPD, interstitial lung disease, chronic hypoxic and hypercarbic respiratory failure, and chronic diastolic CHF; presented with complain of confusion and wekaness, was found to have acute on chronic diastolic CHF with hypoxia and hypercapnia.  Currently further plan is to continue diuresis and monitor its effectiveness.  Subjective: No nausea or vomiting.  Breathing is somewhat improving but not back to baseline.  No chest pain abdominal pain.  No further swelling in the leg.  No diarrhea.  No fever no chills.  Assessment and Plan: 1. Acute metabolic encephalopathy  Presents with confusion, gen weakness, and falls, is found to be hypoxic in 50's on EMS arrival, pCO2 is 106 on VBG in ED, and head CT reveals a left occipital infarction that appears subacute to chronic  Hypercapnia and hypoxia seem to be most likely etiologies   Unremarkable MRI brain Discussed with family and patient.  Mentation is back to baseline.  No further work-up indicated.  2. Acute on chronic diastolic CHF;  acute on chronic hypoxic and hypercarbic respiratory failure  Uses 4LPM oxygen at home.  She was found to be saturating 50's at home but oxygen tubing had been kinked and not delivering any oxygen, sats remain 90's on her usual 4 Lpm in ED. VBG with pCO2 of 106. There is no leg swelling noted but she has rales on auscultation and CXR findings suggests mild pulm edema  Recently admitted to the hospital about a week ago with acute on chronic respiratory failure after having run out of supplemental oxygen at home SARS-CoV-2 negative at this time. Oxygenation back to baseline. Patient remained significantly weak and deconditioned.  Transitioning her from IV Lasix to oral Lasix and monitor overnight. Continue oxygen  3. OSA. ILD. Sleep study 2015 was positive for OSA.  Patient noncompliant with CPAP. Follows up with [facility] for RB ILD. Takes 10 mg prednisone daily. Patient unable to tolerate CPAP due to claustrophobia.  Tells me that it makes her hyperventilate. Continue current taper of 20 mg daily prednisone. Continue oxygen. Patient will benefit from outpatient sleep study with nasal pillow. Follow-up with pulmonary.  4. Schizophrenia, depression, anxiety Following with [facility] psychiatry  No agitation or confusion. Continue home regimen of Abilify, Wellbutrin, Seroquel and trazodone.  6.  History of ischemic stroke  Left occipital infarction noted on CT, reviewed by neurology who suspects it to be chronic and recommends non-emergent MRI brain with formal consultation if the CVA appear subacute or acute on MRI  - MRI brain negative for any acute stroke, chronic left PCA territory infarct. -Continue 81 mg aspirin statin.  7.  Essential hypertension. Blood pressure now well controlled Continue losartan 50 mg daily and Norvasc 5 mg daily.  8.  Hyperlipidemia. Continue Crestor  9.  Substance abuse. Patient occasionally tests positive for cocaine. Currently no evidence of withdrawal. UDS not checked during this admission.  10.  Chronic back pain. Patient is on gabapentin 300 mg 3 times daily. Continue.  Pain well controlled.  11.  Accidental drug ingestion. Due to her confusion, Pt reports taking twice the prescribed dose of: seroquel, trazadone, prednisone. Monitored on telemetry and in stepdown.  No evidence of acute abnormality.  Patient nearing back to baseline in terms  of the mentation.  12.  Generalized deconditioning and discharge planning. Discussed with patient's brother who wants the patient to transfer to [place]. Explained that currently the patient does not have any indication  to be transferred from hospital to hospital to [place]. Patient was recommended to go to SNF. Patient is currently refusing SNF and wants to go home. Explained to the patient will require some therapy to get her strength prior to considering such a long drive.  Diet: Cardiac diet  DVT Prophylaxis: Subcutaneous Lovenox  Advance goals of care discussion: full code  Family Communication: no family was present at bedside, at the time of interview.  Discussed with brother after receiving patient's permission.  All questions answered.  Disposition:  Discharge to home tomorrow  Consultants: none Procedures: Bipap  Scheduled Meds: . ARIPiprazole  5 mg Oral Daily  . aspirin EC  81 mg Oral Daily  . buPROPion  150 mg Oral Daily  . Chlorhexidine Gluconate Cloth  6 each Topical Daily  . enoxaparin (LOVENOX) injection  40 mg Subcutaneous QHS  . famotidine  20 mg Oral QHS  . furosemide  40 mg Oral BID  . gabapentin  300 mg Oral TID  . losartan  50 mg Oral Daily  . mouth rinse  15 mL Mouth Rinse BID  . mometasone-formoterol  2 puff Inhalation BID  . predniSONE  20 mg Oral Q breakfast   Followed by  . [START ON 03/13/2019] predniSONE  10 mg Oral Q breakfast  . QUEtiapine  100 mg Oral q morning - 10a  . QUEtiapine  200 mg Oral QHS  . rosuvastatin  10 mg Oral q1800  . sodium chloride flush  3 mL Intravenous Q12H  . sodium chloride flush  3 mL Intravenous Q12H  . traZODone  100 mg Oral QHS   Continuous Infusions: . sodium chloride     PRN Meds: sodium chloride, acetaminophen **OR** acetaminophen, albuterol, labetalol, nicotine polacrilex, ondansetron **OR** ondansetron (ZOFRAN) IV, polyethylene glycol, sodium chloride flush Antibiotics: Anti-infectives (From admission, onward)   None       Objective: Physical Exam: Vitals:   03/10/19 0557 03/10/19 0600 03/10/19 0838 03/10/19 1515  BP: (!) 145/75   (!) 151/91  Pulse: 87   90  Resp: 18   15  Temp: 98.3 F (36.8 C)   97.8 F (36.6  C)  TempSrc: Oral   Oral  SpO2:   92% 92%  Weight:  99.6 kg    Height:        Intake/Output Summary (Last 24 hours) at 03/10/2019 1800 Last data filed at 03/10/2019 1134 Gross per 24 hour  Intake 480 ml  Output 1850 ml  Net -1370 ml   Filed Weights   03/07/19 2300 03/08/19 0209 03/10/19 0600  Weight: 102.6 kg 102.6 kg 99.6 kg  General: alert and oriented to time, place, and person. Appear in mild distress, affect appropriate Eyes: PERRL, Conjunctiva normal ENT: Oral Mucosa Clear, moist  Neck: no JVD, no Abnormal Mass Or lumps Cardiovascular: S1 and S2 Present, no Murmur, peripheral pulses symmetrical Respiratory: good respiratory effort, Bilateral Air entry equal and Decreased, no signs of accessory muscle use, Clear to Auscultation, no Crackles, no wheezes Abdomen: Bowel Sound present, Soft and no tenderness, no hernia Skin: no rashes  Extremities: no Pedal edema, no calf tenderness Neurologic: without any new focal findings Gait not checked due to patient safety concerns   Data Reviewed: CBC: Recent Labs  Lab 03/07/19 1809 03/08/19 0201 03/09/19 0212 03/10/19  0416  WBC 9.6 9.7 7.8 10.3  NEUTROABS 6.7  --  3.9  --   HGB 16.4* 16.3* 16.4* 17.3*  HCT 63.8* 63.8* 63.6* 66.2*  MCV 89.4 89.1 88.8 88.4  PLT 197 184 202 003   Basic Metabolic Panel: Recent Labs  Lab 03/07/19 1809 03/08/19 0201 03/09/19 0212 03/10/19 0416  NA 144 141 145 144  K 4.0 3.6 3.7 3.5  CL 93* 91* 89* 90*  CO2 41* 43* 39* 43*  GLUCOSE 136* 218* 139* 106*  BUN _0 CREATININE 0.62 0.66 0.74 0.60  CALCIUM 8.6* 8.6* 8.4* 9.4  MG  --   --  1.9 1.9    Liver Function Tests: Recent Labs  Lab 03/07/19 1809 03/09/19 0212  AST 17 14*  ALT 23 18  ALKPHOS 60 56  BILITOT 0.7 0.7  PROT 6.2* 6.3*  ALBUMIN 3.2* 3.1*   No results for input(s): LIPASE, AMYLASE in the last 168 hours. No results for input(s): AMMONIA in the last 168 hours. Coagulation Profile: No results for input(s): INR,  PROTIME in the last 168 hours. Cardiac Enzymes: No results for input(s): CKTOTAL, CKMB, CKMBINDEX, TROPONINI in the last 168 hours. BNP (last 3 results) No results for input(s): PROBNP in the last 8760 hours. CBG: Recent Labs  Lab 03/08/19 0731 03/09/19 0749 03/10/19 0742  GLUCAP 108* 155* 100*   Studies: No results found.   Time spent: 35 minutes  Author: Berle Mull, MD [facility] 03/10/2019 6:00 PM  To reach On-call, see care teams to locate the attending and reach out to them via [email or web address]. If 7PM-7AM, please contact night-coverage If you still have difficulty reaching the attending provider, please page the [facility] (Director on Call) for [facility] on amion for assistance.

## 2019-03-10 NOTE — TOC Initial Note (Signed)
Transition of Care [facility]) - Initial/Assessment Note    Patient Details  Name: Diane Gilbert MRN: [id number] Date of Birth: [birth year]  Transition [facility] Care [facility] CM/SW Contact:    Purcell Mouton, RN Phone Number: 03/10/2019, 1:12 PM  Clinical Narrative:                 Pt has Medicaid, unable to provide [facility] agency for in home services. Pt may benefit with OP PT. Pt is active with [facility] for [place].   Expected Discharge Plan: [place]     Patient Goals and CMS Choice Patient states their goals for this hospitalization and ongoing recovery are:: Plan to return home with [facility] for PCA.   Choice offered to / list presented to : Patient  Expected Discharge Plan and Services Expected Discharge Plan: [place]   Discharge Planning Services: CM Consult Post Acute Care Choice: [place] arrangements for the past 2 months: Single Family Home                           HH Arranged: PCS/Personal Care Services          Prior Living Arrangements/Services Living arrangements for the past 2 months: Single Family Home Lives with:: Self Patient language and need for interpreter reviewed:: No Do you feel safe going back to the place where you live?: Yes          Current home services: Other (comment)(PCA with [facility])    Activities of Daily Living Home Assistive Devices/Equipment: None ADL Screening (condition at time of admission) Patient's cognitive ability adequate to safely complete daily activities?: Yes Is the patient deaf or have difficulty hearing?: No Does the patient have difficulty seeing, even when wearing glasses/contacts?: No Does the patient have difficulty concentrating, remembering, or making decisions?: No Patient able to express need for assistance with ADLs?: Yes Does the patient have difficulty dressing or bathing?: No Independently  performs ADLs?: Yes (appropriate for developmental age) Does the patient have difficulty walking or climbing stairs?: No Weakness of Legs: None Weakness of Arms/Hands: None  Permission Sought/Granted Permission sought to share information with : Case Manager Permission granted to share information with : Yes, Release of Information Signed              Emotional Assessment Appearance:: Appears stated age   Affect (typically observed): Accepting Orientation: : Oriented to Self, Oriented to Place, Oriented to  Time, Oriented to Situation      Admission diagnosis:  Shortness of breath [R06.02] Abnormal CT of the head [R93.0] Patient Active Problem List   Diagnosis Date Noted  . Acute diastolic CHF (congestive heart failure) ([place]) 03/08/2019  . Acute encephalopathy 03/07/2019  . Ischemic stroke ([place]) 03/07/2019  . Acute respiratory failure with hypoxia ([place]) 02/26/2019  . Falls 02/06/2019  . Depression 02/06/2019  . Acute on chronic diastolic CHF (congestive heart failure) ([place]) 05/09/2017  . Acute on chronic respiratory failure with hypoxia and hypercapnia ([place]) 05/09/2017  . Facial swelling 03/23/2017  . Chronic respiratory failure with hypoxia ([place]) 03/23/2017  . Acute diastolic congestive heart failure ([place])   . Schizophrenia ([place]) 03/24/2016  . Leg swelling 01/16/2016  . Interstitial lung disease ([place]) 07/16/2014  . Respiratory bronchiolitis interstitial lung disease ([place]) 12/26/2013  . Irregular menstrual cycle 01/23/2013  . Smoking addiction 01/23/2013  . Alcohol abuse 02/19/2012  .  Hyponatremia 02/19/2012  . Abdominal pain 02/19/2012  . Hypokalemia 02/19/2012   PCP:  Nolene Ebbs, MD Pharmacy:   [facility], [place] [address] Phone: [phone] Fax: [address], [place] [address] Phone:  [phone] Fax: [phone]     Social Determinants of Health (SDOH) Interventions    Readmission Risk Interventions No flowsheet data found.

## 2019-03-10 NOTE — Plan of Care (Signed)
Pt had a good appetite this shift. Pt was calm and cooperative and denied pain.

## 2019-03-11 ENCOUNTER — Inpatient Hospital Stay (HOSPITAL_COMMUNITY): Payer: Medicaid Other

## 2019-03-11 LAB — BASIC METABOLIC PANEL
Anion gap: 13 (ref 5–15)
BUN: 13 mg/dL (ref 6–20)
CO2: 40 mmol/L — ABNORMAL HIGH (ref 22–32)
Calcium: 9.3 mg/dL (ref 8.9–10.3)
Chloride: 89 mmol/L — ABNORMAL LOW (ref 98–111)
Creatinine, Ser: 0.62 mg/dL (ref 0.44–1.00)
GFR calc Af Amer: >60 mL/min (ref >60)
GFR calc non Af Amer: >60 mL/min (ref >60)
Glucose, Bld: 135 mg/dL — ABNORMAL HIGH (ref 70–99)
Potassium: 3.6 mmol/L (ref 3.5–5.1)
Sodium: 142 mmol/L (ref 135–145)

## 2019-03-11 LAB — CBC
HCT: 66.3 % — ABNORMAL HIGH (ref 36.0–46.0)
Hemoglobin: 17.5 g/dL — ABNORMAL HIGH (ref 12.0–15.0)
MCH: 23.1 pg — ABNORMAL LOW (ref 26.0–34.0)
MCHC: 26.4 g/dL — ABNORMAL LOW (ref 30.0–36.0)
MCV: 87.4 fL (ref 80.0–100.0)
Platelets: 187 10*3/uL (ref 150–400)
RBC: 7.59 MIL/uL — ABNORMAL HIGH (ref 3.87–5.11)
RDW: 22.5 % — ABNORMAL HIGH (ref 11.5–15.5)
WBC: 10.3 10*3/uL (ref 4.0–10.5)
nRBC: 0 % (ref 0.0–0.2)

## 2019-03-11 LAB — GLUCOSE, CAPILLARY: Glucose-Capillary: 108 mg/dL — ABNORMAL HIGH (ref 70–99)

## 2019-03-11 LAB — MAGNESIUM: Magnesium: 2.1 mg/dL (ref 1.7–2.4)

## 2019-03-11 MED ORDER — BISACODYL 10 MG RE SUPP: 10.0000 mg | Freq: Every day | RECTAL | Status: DC | PRN

## 2019-03-11 MED ORDER — SENNOSIDES-DOCUSATE SODIUM 8.6-50 MG PO TABS
2.0000 | ORAL_TABLET | Freq: Once | ORAL | Status: DC
  Filled 2019-03-11: qty 2

## 2019-03-11 MED ORDER — METOPROLOL SUCCINATE ER 25 MG PO TB24
25.0000 mg | ORAL_TABLET | Freq: Every day | ORAL | Status: DC
  Administered 2019-03-11 – 2019-03-12 (×2): 25 mg via ORAL
  Filled 2019-03-11 (×2): qty 1

## 2019-03-11 MED ORDER — POLYETHYLENE GLYCOL 3350 17 G PO PACK
17.0000 g | PACK | Freq: Every day | ORAL | Status: DC
  Administered 2019-03-12: 17 g via ORAL
  Filled 2019-03-11 (×2): qty 1

## 2019-03-11 NOTE — Progress Notes (Signed)
[facility] Progress Note  Patient: Diane Gilbert VWU:[id number]   PCP: Nolene Ebbs, MD DOB: [birth year]   DOA: 03/07/2019   DOS: 03/11/2019   Date of Service: the patient was seen and examined on 03/11/2019  Brief hospital course: Pt. with PMH of  schizophrenia, hypertension, COPD, interstitial lung disease, chronic hypoxic and hypercarbic respiratory failure, and chronic diastolic CHF; presented with complain of confusion and wekaness, was found to have acute on chronic diastolic CHF with hypoxia and hypercapnia.  Currently further plan is to monitor on telemetry for improvement in PVCs as well as constipation  Subjective: Reports feeling constipated.  Passing gas.  Severe left lower quadrant pain.  No nausea no vomiting so far.  Breathing is improving. No fever no chills.  Remains lethargic and tired.  Assessment and Plan: 1. Acute metabolic encephalopathy  Presents with confusion, gen weakness, and falls, is found to be hypoxic in 50's on EMS arrival, pCO2 is 106 on VBG in ED, and head CT reveals a left occipital infarction that appears subacute to chronic  Hypercapnia and hypoxia seem to be most likely etiologies   Unremarkable MRI brain Discussed with family and patient.  Mentation is back to baseline.  No further work-up indicated.  2. Acute on chronic diastolic CHF;  acute on chronic hypoxic and hypercarbic respiratory failure  Uses 4LPM oxygen at home.  She was found to be saturating 50's at home but oxygen tubing had been kinked and not delivering any oxygen, sats remain 90's on her usual 4 Lpm in ED. VBG with pCO2 of 106. There is no leg swelling noted but she has rales on auscultation and CXR findings suggests mild pulm edema  Recently admitted to the hospital about a week ago with acute on chronic respiratory failure after having run out of supplemental oxygen at home SARS-CoV-2 negative at this time. Oxygenation back to baseline. Patient remained significantly  weak and deconditioned. Patient was treated with IV Lasix. Currently tolerating oral Lasix with improvement in diuresis.  Oxygenation remained stable.  3. OSA. ILD. Sleep study 2015 was positive for OSA.  Patient noncompliant with CPAP. Follows up with [facility] pulmonary for RB ILD. Takes 10 mg prednisone daily. Patient unable to tolerate CPAP due to claustrophobia.  Tells me that it makes her hyperventilate. Continue current taper of 20 mg daily prednisone. Continue oxygen. Patient will benefit from outpatient sleep study with nasal pillow. Follow-up with pulmonary.  4. Schizophrenia, depression, anxiety Following with [facility] psychiatry  No agitation or confusion. Continue home regimen of Abilify, Wellbutrin, Seroquel and trazodone.  6.  History of ischemic stroke  Left occipital infarction noted on CT, reviewed by neurology who suspects it to be chronic and recommends non-emergent MRI brain with formal consultation if the CVA appear subacute or acute on MRI  - MRI brain negative for any acute stroke, chronic left PCA territory infarct. -Continue 81 mg aspirin statin.  7.  Essential hypertension. Blood pressure now well controlled Continue losartan 50 mg daily and Norvasc 5 mg daily.  8.  Hyperlipidemia. Continue Crestor  9.  Substance abuse. Patient occasionally tests positive for cocaine. Currently no evidence of withdrawal. UDS not checked during this admission.  10.  Chronic back pain. Patient is on gabapentin 300 mg 3 times daily. Continue.  Pain well controlled.  11.  Accidental drug ingestion. Due to her confusion, Pt reports taking twice the prescribed dose of: seroquel, trazadone, prednisone. Monitored on telemetry and in stepdown.  No evidence of acute abnormality.  Patient nearing back to baseline in terms of the mentation.  12.  Generalized deconditioning and discharge planning. Discussed with patient's brother who wants the patient to transfer to [place].  Explained that currently the patient does not have any indication to be transferred from hospital to hospital to [place]. Patient was recommended to go to SNF. Patient is currently refusing SNF and wants to go home. Explained to the patient will require some therapy to get her strength prior to considering such a long drive.  13.  Left lower quadrant pain. Severe constipation. Patient reports severe abdominal pain on the left lower quadrant. X-ray abdomen shows evidence of constipation. Will provide stool softener. Patient actually had a bowel movement after that.  Diet: Cardiac diet  DVT Prophylaxis: Subcutaneous Lovenox  Advance goals of care discussion: full code  Family Communication: no family was present at bedside, at the time of interview.  Discussed with brother after receiving patient's permission.  All questions answered.  Disposition:  Discharge to home tomorrow  Consultants: none Procedures: Bipap  Scheduled Meds: . ARIPiprazole  5 mg Oral Daily  . aspirin EC  81 mg Oral Daily  . buPROPion  150 mg Oral Daily  . Chlorhexidine Gluconate Cloth  6 each Topical Daily  . enoxaparin (LOVENOX) injection  40 mg Subcutaneous QHS  . famotidine  20 mg Oral QHS  . furosemide  40 mg Oral BID  . gabapentin  300 mg Oral TID  . losartan  50 mg Oral Daily  . mouth rinse  15 mL Mouth Rinse BID  . metoprolol succinate  25 mg Oral Daily  . mometasone-formoterol  2 puff Inhalation BID  . polyethylene glycol  17 g Oral Daily  . predniSONE  20 mg Oral Q breakfast   Followed by  . [START ON 03/13/2019] predniSONE  10 mg Oral Q breakfast  . QUEtiapine  100 mg Oral q morning - 10a  . QUEtiapine  200 mg Oral QHS  . rosuvastatin  10 mg Oral q1800  . sodium chloride flush  3 mL Intravenous Q12H  . sodium chloride flush  3 mL Intravenous Q12H  . traZODone  100 mg Oral QHS   Continuous Infusions: . sodium chloride     PRN Meds: sodium chloride, acetaminophen **OR** acetaminophen,  albuterol, bisacodyl, labetalol, nicotine polacrilex, ondansetron **OR** ondansetron (ZOFRAN) IV, polyethylene glycol, sodium chloride flush Antibiotics: Anti-infectives (From admission, onward)   None       Objective: Physical Exam: Vitals:   03/10/19 2249 03/11/19 0512 03/11/19 0820 03/11/19 1348  BP: (!) 166/90 131/77  129/78  Pulse: 88 83 81 90  Resp: _0 Temp: 98.4 F (36.9 C) 98.6 F (37 C)  98.7 F (37.1 C)  TempSrc: Oral Oral  Oral  SpO2: 92% 92% 91% 95%  Weight:      Height:        Intake/Output Summary (Last 24 hours) at 03/11/2019 1759 Last data filed at 03/11/2019 1500 Gross per 24 hour  Intake 1020 ml  Output 750 ml  Net 270 ml   Filed Weights   03/07/19 2300 03/08/19 0209 03/10/19 0600  Weight: 102.6 kg 102.6 kg 99.6 kg   General: alert and oriented to time, place, and person. Appear in moderate distress, affect anxious Eyes: PERRL, Conjunctiva normal ENT: Oral Mucosa Clear, moist  Neck: no JVD, no Abnormal Mass Or lumps Cardiovascular: S1 and S2 Present, no Murmur, peripheral pulses symmetrical Respiratory: good respiratory effort, Bilateral Air entry equal and  Decreased, no signs of accessory muscle use, Stewart TClear to Auscultation, no Crackles, no wheezes Abdomen: Bowel Sound present, Soft and LLQ tenderness, no hernia Skin: no rashes  Extremities: no Pedal edema, no calf tenderness Neurologic: without any new focal findings Gait not checked due to patient safety concerns  Data Reviewed: CBC: Recent Labs  Lab 03/07/19 1809 03/08/19 0201 03/09/19 0212 03/10/19 0416 03/11/19 0419  WBC 9.6 9.7 7.8 10.3 10.3  NEUTROABS 6.7  --  3.9  --   --   HGB 16.4* 16.3* 16.4* 17.3* 17.5*  HCT 63.8* 63.8* 63.6* 66.2* 66.3*  MCV 89.4 89.1 88.8 88.4 87.4  PLT 197 184 202 183 146   Basic Metabolic Panel: Recent Labs  Lab 03/07/19 1809 03/08/19 0201 03/09/19 0212 03/10/19 0416 03/11/19 0419  NA 144 141 145 144 142  K 4.0 3.6 3.7 3.5 3.6   CL 93* 91* 89* 90* 89*  CO2 41* 43* 39* 43* 40*  GLUCOSE 136* 218* 139* 106* 135*  BUN _0 CREATININE 0.62 0.66 0.74 0.60 0.62  CALCIUM 8.6* 8.6* 8.4* 9.4 9.3  MG  --   --  1.9 1.9 2.1    Liver Function Tests: Recent Labs  Lab 03/07/19 1809 03/09/19 0212  AST 17 14*  ALT 23 18  ALKPHOS 60 56  BILITOT 0.7 0.7  PROT 6.2* 6.3*  ALBUMIN 3.2* 3.1*   No results for input(s): LIPASE, AMYLASE in the last 168 hours. No results for input(s): AMMONIA in the last 168 hours. Coagulation Profile: No results for input(s): INR, PROTIME in the last 168 hours. Cardiac Enzymes: No results for input(s): CKTOTAL, CKMB, CKMBINDEX, TROPONINI in the last 168 hours. BNP (last 3 results) No results for input(s): PROBNP in the last 8760 hours. CBG: Recent Labs  Lab 03/08/19 0731 03/09/19 0749 03/10/19 0742 03/11/19 0750  GLUCAP 108* 155* 100* 108*   Studies: Dg Abd Portable 1v  Result Date: 03/11/2019 CLINICAL DATA:  Left-sided abdominal pain after fall 3 days ago. EXAM: PORTABLE ABDOMEN - 1 VIEW COMPARISON:  None. FINDINGS: Bowel gas pattern is nonobstructive with mild fecal retention over the right colon and rectosigmoid colon. No free peritoneal air. Fusion hardware intact over the lower lumbar spine. Remainder the exam is unremarkable. IMPRESSION: Nonobstructive bowel gas pattern with mild fecal retention over the right colon and rectosigmoid colon. Electronically Signed   By: Marin Olp M.D.   On: 03/11/2019 15:51     Time spent: 35 minutes  Author: Berle Mull, MD [facility] 03/11/2019 5:59 PM  To reach On-call, see care teams to locate the attending and reach out to them via [email or web address]. If 7PM-7AM, please contact night-coverage If you still have difficulty reaching the attending provider, please page the [facility] (Director on Call) for [facility] on amion for assistance.

## 2019-03-12 LAB — CBC
HCT: 65.7 % — ABNORMAL HIGH (ref 36.0–46.0)
Hemoglobin: 16.9 g/dL — ABNORMAL HIGH (ref 12.0–15.0)
MCH: 22.9 pg — ABNORMAL LOW (ref 26.0–34.0)
MCHC: 25.7 g/dL — ABNORMAL LOW (ref 30.0–36.0)
MCV: 88.9 fL (ref 80.0–100.0)
Platelets: 182 10*3/uL (ref 150–400)
RBC: 7.39 MIL/uL — ABNORMAL HIGH (ref 3.87–5.11)
RDW: 22.2 % — ABNORMAL HIGH (ref 11.5–15.5)
WBC: 8.5 10*3/uL (ref 4.0–10.5)
nRBC: 0 % (ref 0.0–0.2)

## 2019-03-12 LAB — CULTURE, BLOOD (ROUTINE X 2)
Culture: NO GROWTH
Culture: NO GROWTH
Special Requests: ADEQUATE

## 2019-03-12 LAB — GLUCOSE, CAPILLARY: Glucose-Capillary: 125 mg/dL — ABNORMAL HIGH (ref 70–99)

## 2019-03-12 LAB — BASIC METABOLIC PANEL
Anion gap: 11 (ref 5–15)
BUN: 13 mg/dL (ref 6–20)
CO2: 40 mmol/L — ABNORMAL HIGH (ref 22–32)
Calcium: 8.9 mg/dL (ref 8.9–10.3)
Chloride: 92 mmol/L — ABNORMAL LOW (ref 98–111)
Creatinine, Ser: 0.63 mg/dL (ref 0.44–1.00)
GFR calc Af Amer: >60 mL/min (ref >60)
GFR calc non Af Amer: >60 mL/min (ref >60)
Glucose, Bld: 130 mg/dL — ABNORMAL HIGH (ref 70–99)
Potassium: 4.1 mmol/L (ref 3.5–5.1)
Sodium: 143 mmol/L (ref 135–145)

## 2019-03-12 LAB — MAGNESIUM: Magnesium: 2.1 mg/dL (ref 1.7–2.4)

## 2019-03-12 MED ORDER — FUROSEMIDE 40 MG PO TABS: 40.0000 mg | ORAL_TABLET | Freq: Two times a day (BID) | ORAL | 0 refills | Status: AC

## 2019-03-12 MED ORDER — POLYETHYLENE GLYCOL 3350 17 G PO PACK: 17.0000 g | PACK | Freq: Every day | ORAL | 0 refills | Status: DC

## 2019-03-12 MED ORDER — METOPROLOL SUCCINATE ER 25 MG PO TB24: 25.0000 mg | ORAL_TABLET | Freq: Every day | ORAL | 0 refills | Status: DC

## 2019-03-12 MED ORDER — ASPIRIN 81 MG PO TBEC: 81.0000 mg | DELAYED_RELEASE_TABLET | Freq: Every day | ORAL | 0 refills | Status: DC

## 2019-03-12 MED ORDER — PREDNISONE 10 MG PO TABS: 10.0000 mg | ORAL_TABLET | Freq: Every day | ORAL | 0 refills | Status: DC

## 2019-03-12 NOTE — TOC Progression Note (Signed)
Transition of Care [facility]) - Progression Note    Patient Details  Name: Diane Gilbert MRN: [id number] Date of Birth: [birth year]  Transition of Care [facility]) CM/SW Contact  Joaquin Courts, RN Phone Number: 03/12/2019, 12:02 PM  Clinical Narrative:    CM spoke with patient. Unable to find home health agency to accept patient. Patient has aide through [facility] which she plans to continue at discharge. Patient is on home O2 through [employer]. Patient reports she has a portable tank at bedside for dc home.   Expected Discharge Plan: [place]    Expected Discharge Plan and Services Expected Discharge Plan: [place]   Discharge Planning Services: CM Consult Post Acute Care Choice: [place] arrangements for the past 2 months: Single Family Home Expected Discharge Date: 03/12/19                         HH Arranged: PCS/Personal Care Services           Social Determinants of Health (SDOH) Interventions    Readmission Risk Interventions No flowsheet data found.

## 2019-03-12 NOTE — Progress Notes (Addendum)
Patient remains stable at discharge. Questions,concerns denied regarding instruction. Pt discharged with all belongings, home meds & home O2 tank at bedside.

## 2019-03-12 NOTE — Discharge Summary (Signed)
[facility] Discharge Summary   Patient: Diane Gilbert OJJ:[id number]   PCP: Nolene Ebbs, MD DOB: [birth year]   Date of admission: 03/07/2019   Date of discharge: 03/12/2019     Discharge Diagnoses:  Principal Problem:   Acute encephalopathy Active Problems:   Respiratory bronchiolitis interstitial lung disease ([place])   Interstitial lung disease ([place])   Schizophrenia ([place])   Acute on chronic diastolic CHF (congestive heart failure) (HCC)   Acute on chronic respiratory failure with hypoxia and hypercapnia (HCC)   Ischemic stroke ([place])   Acute diastolic CHF (congestive heart failure) ([place])  Admitted From: home Disposition:  Home pt refused SNF history of  Recommendations for Outpatient Follow-up:  1. PCP: please monitor adequacy of diuresis 2. Follow up LABS/TEST: None  Follow-up Information    Nolene Ebbs, MD. Schedule an appointment as soon as possible for a visit in 1 week(s).   Specialty: Internal Medicine Contact information: [place] [id number] [phone]          Diet recommendation: Cardiac diet  Activity: The patient is advised to gradually reintroduce usual activities,as tolerated .  Discharge Condition: good  Code Status: Full code   History of present illness: As per the H and P dictated on admission, "Diane Gilbert is a 53 y.o. female with medical history significant for schizophrenia, hypertension, COPD, interstitial lung disease, chronic hypoxic and hypercarbic respiratory failure, and chronic diastolic CHF, now presenting to the emergency department with confusion, generalized weakness, and falls at home.  Patient was admitted to the hospital about a week ago with acute on chronic respiratory failure after having run out of supplemental oxygen at home.  She called EMS today due to some confusion, generalized weakness, and falls.  She was found to be saturating 52% on EMS arrival but her oxygen tubing was noted to  be kinked and not delivering any supplemental oxygen.  Patient reports falling due to generalized weakness, denies hitting her head or losing consciousness, but reports some right ankle pain after the falls.  She accidentally took a second dose of some of her medications today due to confusion.  She denies any fevers, chills, chest pain, or palpitations.  She denies any headache, change in vision or hearing, or focal numbness or weakness.  Patient reports that she is hungry and has been asking for something to eat."  Hospital Course:  Summary of her active problems in the hospital is as following. 1. Acute metabolic encephalopathy Presents with confusion, gen weakness, and falls, is found to be hypoxic in 50's on EMS arrival, pCO2 is 106 on VBG in ED, and head CT reveals a left occipital infarction that appears subacute to chronic Hypercapnia and hypoxia seem to be most likely etiologies Unremarkable MRI brain Discussed with family and patient.  Mentation is back to baseline.  No further work-up indicated.  2. Acute on chronic diastolic CHF;  acute on chronic hypoxic and hypercarbic respiratory failure Uses 4LPM oxygen at home.  She was found to be saturating 50's at home but oxygen tubing had been kinked and not delivering any oxygen, sats remain 90's on her usual 4 Lpm in ED. VBG with pCO2 of 106. There is no leg swelling noted but she has rales on auscultation and CXR findings suggests mild pulm edema Recently admitted to the hospital about a week ago with acute on chronic respiratory failure after having run out of supplemental oxygen at home SARS-CoV-2 negative at this time. Oxygenation back to baseline. Patient  remained significantly weak and deconditioned. Patient was treated with IV Lasix. Currently tolerating oral Lasix with improvement in diuresis.  Oxygenation remained stable. Ins and outs are not accurate. Weight down from 226 pound to 218 pound.  3. OSA. ILD. Sleep  study 2015 was positive for OSA.  Patient noncompliant with CPAP. Follows up with [facility] for RB ILD. Takes 10 mg prednisone daily. Patient unable to tolerate CPAP due to claustrophobia.  Tells me that it makes her hyperventilate. Continue current taper of 20 mg daily prednisone. Continue oxygen. Patient will benefit from outpatient sleep study with nasal pillow. Follow-up with pulmonary.  4. Schizophrenia, depression, anxiety Following with [facility] psychiat[facility] No agitation or confusion. Continue home regimen of Abilify, Wellbutrin, Seroquel and trazodone.  6.  History of ischemic stroke Left occipital infarction noted on CT, reviewed by neurology who suspects it to be chronic and recommends non-emergent MRI brain with formal consultation if the CVA appear subacute or acute on MRI -MRI brain negative for any acute stroke, chronic left PCA territory infarct. -Continue 81 mg aspirin statin.  7.  Essential hypertension. Blood pressure now well controlled Continue losartan 50 mg daily and Norvasc 5 mg daily.  8.  Hyperlipidemia. Continue Crestor  9.  Substance abuse. Patient occasionally tests positive for cocaine. Currently no evidence of withdrawal. UDS not checked during this admission.  10.  Chronic back pain. Patient is on gabapentin 300 mg 3 times daily. Continue.  Pain well controlled.  11.  Accidental drug ingestion. Due to her confusion, Pt reports taking twice the prescribed dose of: seroquel, trazadone, prednisone. Monitored on telemetry and in stepdown.  No evidence of acute abnormality.  Patient nearing back to baseline in terms of the mentation.  12.  Generalized deconditioning and discharge planning. Discussed with patient's brother who wants the patient to transfer to [place]. Explained that currently the patient does not have any indication to be transferred from hospital to hospital to [place]. Patient was recommended to go to SNF.  Patient is currently refusing SNF and wants to go home. Explained to the patient will require some therapy to get her strength prior to considering such a long drive.  13.  Left lower quadrant pain. Severe constipation. Patient reports severe abdominal pain on the left lower quadrant. X-ray abdomen shows evidence of constipation. Will provide stool softener. Patient actually had a bowel movement after that.  Patient was seen by physical therapy, who recommended SNF, patient refused SNF when opted for home health,  On the day of the discharge the patient's vitals were stable, and no other acute medical condition were reported by patient. the patient was felt safe to be discharge at Home with Home health.  Consultants: none Procedures: none  DISCHARGE MEDICATION: Allergies as of 03/12/2019      Reactions   Unasyn [ampicillin-sulbactam Sodium] Swelling, Rash   angioedema   Contrast Media [iodinated Diagnostic Agents] Nausea And Vomiting   Hydralazine Swelling      Medication List    STOP taking these medications   amLODipine 5 MG tablet Commonly known as: NORVASC   ibuprofen 800 MG tablet Commonly known as: ADVIL   naproxen 375 MG tablet Commonly known as: NAPROSYN   nicotine 7 mg/24hr patch Commonly known as: NICODERM CQ - dosed in mg/24 hr     TAKE these medications   albuterol 108 (90 Base) MCG/ACT inhaler Commonly known as: VENTOLIN HFA Inhale 2 puffs into the lungs every 6 (six) hours as needed for wheezing or shortness  of breath.   albuterol (2.5 MG/3ML) 0.083% nebulizer solution Commonly known as: PROVENTIL Take 3 mLs (2.5 mg total) by nebulization every 6 (six) hours as needed for wheezing or shortness of breath.   ARIPiprazole 5 MG tablet Commonly known as: ABILIFY Take 5 mg by mouth daily.   aspirin 81 MG EC tablet Take 1 tablet (81 mg total) by mouth daily.   budesonide-formoterol 160-4.5 MCG/ACT inhaler Commonly known as: Symbicort INHALE TWO puffs  into THE lungs TWICE DAILY What changed:   how much to take  how to take this  when to take this  additional instructions   buPROPion 150 MG 12 hr tablet Commonly known as: WELLBUTRIN SR Take 150 mg by mouth daily.   diphenhydrAMINE 25 mg capsule Commonly known as: BENADRYL Take 1 capsule (25 mg total) by mouth every 6 (six) hours as needed for itching.   famotidine 20 MG tablet Commonly known as: PEPCID Take 1 tablet (20 mg total) by mouth at bedtime.   furosemide 40 MG tablet Commonly known as: LASIX Take 1 tablet (40 mg total) by mouth 2 (two) times daily. What changed: when to take this   gabapentin 300 MG capsule Commonly known as: NEURONTIN Take 300 mg by mouth 3 (three) times daily.   losartan 50 MG tablet Commonly known as: COZAAR Take 1 tablet (50 mg total) by mouth daily.   metoprolol succinate 25 MG 24 hr tablet Commonly known as: TOPROL-XL Take 1 tablet (25 mg total) by mouth daily.   multivitamin with minerals Tabs tablet Take 1 tablet by mouth daily.   nicotine polacrilex 2 MG gum Commonly known as: NICORETTE Take 2 mg by mouth as needed for smoking cessation.   OXYGEN Inhale 4 L into the lungs continuous.   polyethylene glycol 17 g packet Commonly known as: MIRALAX / GLYCOLAX Take 17 g by mouth daily.   predniSONE 10 MG tablet Commonly known as: DELTASONE Take 1 tablet (10 mg total) by mouth daily with breakfast. Start taking on: March 13, 2019 What changed:   medication strength  how much to take  how to take this  when to take this  additional instructions   QUEtiapine 100 MG tablet Commonly known as: SEROQUEL Take 100 mg by mouth every morning.   QUEtiapine 200 MG tablet Commonly known as: SEROQUEL Take 200 mg by mouth at bedtime.   rosuvastatin 10 MG tablet Commonly known as: CRESTOR Take 1 tablet (10 mg total) by mouth daily.   traZODone 100 MG tablet Commonly known as: DESYREL Take 100 mg by mouth at  bedtime.      Allergies  Allergen Reactions   Unasyn [Ampicillin-Sulbactam Sodium] Swelling and Rash    angioedema   Contrast Media [Iodinated Diagnostic Agents] Nausea And Vomiting   Hydralazine Swelling   Discharge Instructions    Diet - low sodium heart healthy   Complete by: As directed    Discharge instructions   Complete by: As directed    It is important that you read the given instructions as well as go over your medication list with RN to help you understand your care after this hospitalization.  Please follow-up with PCP in 1-2 weeks.  Please note that NO REFILLS for any discharge medications will be authorized once you are discharged, as it is imperative that you return to your primary care physician (or establish a relationship with a primary care physician if you do not have one) for your aftercare needs so that they can  reassess your need for medications and monitor your lab values.  Please request your primary care physician to go over all Hospital Tests and Procedure/Radiological results at the follow up. Please get all Hospital records sent to your PCP by signing hospital release before you go home.   Do not drive, operating heavy machinery, perform activities at heights, swimming or participation in water activities or provide baby sitting services; until you have been seen by Primary Care Physician or a Neurologist and are cleared to do such activities.  Do not take more than prescribed Pain, Sleep and Anxiety Medications.  You were cared for by a hospitalist during your hospital stay. If you have any questions about your discharge medications or the care you received while you were in the hospital after you are discharged, you can call the unit _0 @ you were admitted to and ask to speak with the hospitalist Berle Mull. Ask for Hospitalist on call if the hospitalist that took care of you is not available.   Once you are discharged, your primary care physician  will handle any further medical issues.  You Must read complete instructions/literature along with all the possible adverse reactions/side effects for all the Medicines you take and that have been prescribed to you. Take any new Medicines after you have completely understood and accept all the possible adverse reactions/side effects.  If you have smoked or chewed Tobacco in the last 2 yrs please STOP smoking STOP any Recreational drug use. PLEASE AVOID cocaine while on metoprolol.   If you drink alcohol, please safely STOP the use. Do not drive, operating heavy machinery, perform activities at heights, swimming or participation in water activities or provide baby sitting services under influence.  Wear Seat belts while driving.   Increase activity slowly   Complete by: As directed      Discharge Exam: Filed Weights   03/08/19 0209 03/10/19 0600 03/12/19 0500  Weight: 102.6 kg 99.6 kg 99 kg   Vitals:   03/12/19 0815 03/12/19 1109  BP:  122/73  Pulse: 77 70  Resp: 14 18  Temp:    SpO2: 93% 98%   General: Appear in no distress, no Rash; Oral Mucosa Clear, moist. no Abnormal Mass Or lumps Cardiovascular: S1 and S2 Present, no Murmur, Respiratory: normal respiratory effort, Bilateral Air entry present and Clear to Auscultation, no Crackles, no wheezes Abdomen: Bowel Sound present, Soft and no tenderness, no hernia Extremities: no Pedal edema, no calf tenderness Neurology: alert and oriented to time, place, and person affect appropriate.  The results of significant diagnostics from this hospitalization (including imaging, microbiology, ancillary and laboratory) are listed below for reference.    Significant Diagnostic Studies: Dg Chest 2 View  Result Date: 03/07/2019 CLINICAL DATA:  Shortness of breath EXAM: CHEST - 2 VIEW COMPARISON:  02/22/2019 FINDINGS: Cardiomegaly with vascular congestion. Diffuse interstitial and alveolar opacities concerning for edema. No effusions. No acute  bony abnormality. IMPRESSION: Cardiomegaly with vascular congestion and concern for mild pulmonary edema. Electronically Signed   By: Rolm Baptise M.D.   On: 03/07/2019 19:00   Dg Ankle Complete Right  Result Date: 03/07/2019 CLINICAL DATA:  Fall with ankle pain EXAM: RIGHT ANKLE - COMPLETE 3+ VIEW COMPARISON:  None. FINDINGS: No fracture or malalignment. Ankle mortise is symmetric. Soft tissues are unremarkable. IMPRESSION: No acute osseous abnormality Electronically Signed   By: Donavan Foil M.D.   On: 03/07/2019 19:01   Ct Head Wo Contrast  Result Date: 03/07/2019 CLINICAL DATA:  Fall  3 times today. EXAM: CT HEAD WITHOUT CONTRAST TECHNIQUE: Contiguous axial images were obtained from the base of the skull through the vertex without intravenous contrast. COMPARISON:  None. FINDINGS: Brain: Infarcts noted in the left occipital lobe which appears subacute to chronic. No acute intracranial abnormality. Specifically, no hemorrhage, hydrocephalus, mass lesion, acute infarction, or significant intracranial injury. Mild chronic small vessel disease throughout the deep white matter. Vascular: No hyperdense vessel or unexpected calcification. Skull: No acute calvarial abnormality. Sinuses/Orbits: Visualized paranasal sinuses and mastoids clear. Orbital soft tissues unremarkable. 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 Chronic small vessel disease throughout the deep white matter. Electronically Signed   By: Rolm Baptise M.D.   On: 03/07/2019 19:36   Mr Brain Wo Contrast  Result Date: 03/08/2019 CLINICAL DATA:  53 year old female with recent falls. Age indeterminate left occipital infarct on head CT yesterday. EXAM: MRI HEAD WITHOUT CONTRAST TECHNIQUE: Multiplanar, multiecho pulse sequences of the brain and surrounding structures were obtained without intravenous contrast. COMPARISON:  Head CT 03/07/2019. FINDINGS: Study is intermittently degraded by motion artifact despite  repeated imaging attempts. Brain: Cortical encephalomalacia in the left occipital pole and lateral aspect of the left occipital lobe is associated with facilitated diffusion. No convincing restricted diffusion to suggest acute infarction. Chronic appearing lacunar infarcts in both globus pallidus. Scattered and patchy bilateral cerebral white matter T2 and FLAIR hyperintensity which is moderately advanced for age. Small chronic infarct in the right cerebellum (series 5, image 7). No other cortical encephalomalacia. No chronic cerebral blood products identified. No midline shift, mass effect, evidence of mass lesion, ventriculomegaly, extra-axial collection or acute intracranial hemorrhage. Cervicomedullary junction and pituitary are within normal limits. Vascular: Major intracranial vascular flow voids are preserved. Skull and upper cervical spine: Grossly negative visible cervical spine. Grossly normal bone marrow signal. Sinuses/Orbits: Negative orbits aside from suggestion of increased intraorbital fat bilaterally. Only trace paranasal sinus mucosal thickening. Other: Mastoids are clear. Scalp and face soft tissues appear negative. IMPRESSION: 1. Intermittently motion degraded exam with no acute intracranial abnormality. 2. Chronic left PCA territory infarct. Chronic small vessel infarcts in the bilateral globus pallidus, the right cerebellum, and probably also likely scattered in the cerebral white matter. 3. 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 Electronically Signed   By: Genevie Ann M.D.   On: 03/08/2019 15:13   Ct Chest High Resolution  Result Date: 02/22/2019 CLINICAL DATA:  Evaluate for interstitial lung disease, smoker, chronic shortness of breath EXAM: CT CHEST WITHOUT CONTRAST TECHNIQUE: Multidetector CT imaging of the chest was performed following the standard protocol without intravenous contrast. High resolution imaging of the lungs, as  well as inspiratory and expiratory imaging, was performed. COMPARISON:  03/23/2017, 03/24/2016 FINDINGS: Cardiovascular: No significant vascular findings. Cardiomegaly. Trace pericardial effusion. Mediastinum/Nodes: No enlarged mediastinal, hilar, or axillary lymph nodes. Thyroid gland, trachea, and esophagus demonstrate no significant findings. Lungs/Pleura: Examination is somewhat limited by body habitus, underpenetration, and breath motion artifact. Bandlike scarring or atelectasis of the medial right middle lobe and lingula. Mild centrilobular emphysema. Diffuse ground-glass opacity. Mild geographic attenuation of the airspaces, not clearly increased on expiratory phase imaging. Evidence of a prior wedge biopsy of the left lower lobe. No pleural effusion or pneumothorax. Upper Abdomen: No acute abnormality. Musculoskeletal: No chest wall mass or suspicious bone lesions identified. IMPRESSION: 1. Examination is somewhat limited by body habitus, underpenetration, and breath motion artifact. Bandlike scarring or atelectasis of the medial right middle  lobe and lingula, likely compressive due to cardiomegaly. Mild centrilobular emphysema and diffuse ground-glass opacity, the appearance of the latter possibly exaggerated by technical factors of perhaps reflecting smoking-related respiratory bronchiolitis. No evidence of fibrotic interstitial lung disease. 2. Mild geographic attenuation of the airspaces, not clearly increased on expiratory phase imaging although nonetheless suggestive of small airways disease. 3.  Cardiomegaly and trace pericardial effusion. Electronically Signed   By: Eddie Candle M.D.   On: 02/22/2019 15:57   Dg Chest Port 1 View  Result Date: 02/28/2019 CLINICAL DATA:  Pulmonary edema. EXAM: PORTABLE CHEST 1 VIEW COMPARISON:  View chest x-ray 02/26/2019.  CT chest 02/22/2019 FINDINGS: The heart is enlarged. Interstitial and airspace opacities demonstrates slight increase since the prior exam.  Left greater than right basilar airspace opacities are present. Left pleural effusion is suspected. IMPRESSION: 1. Cardiomegaly and increasing interstitial edema consistent with congestive heart failure. 2. Left greater than right pleural effusions and associated airspace disease. While this likely reflects atelectasis, infection is not excluded. Electronically Signed   By: San Morelle M.D.   On: 02/28/2019 07:05   Dg Chest Port 1 View  Result Date: 02/26/2019 CLINICAL DATA:  Shortness of breath. EXAM: PORTABLE CHEST 1 VIEW COMPARISON:  August 02, 2018 FINDINGS: Stable cardiomegaly. The hila and mediastinum are unchanged. No pneumothorax. Increased interstitial opacities are identified. IMPRESSION: Increased interstitial opacities may be due to mild edema versus atypical infection. Electronically Signed   By: Dorise Bullion III M.D   On: 02/26/2019 20:16   Dg Abd Portable 1v  Result Date: 03/11/2019 CLINICAL DATA:  Left-sided abdominal pain after fall 3 days ago. EXAM: PORTABLE ABDOMEN - 1 VIEW COMPARISON:  None. FINDINGS: Bowel gas pattern is nonobstructive with mild fecal retention over the right colon and rectosigmoid colon. No free peritoneal air. Fusion hardware intact over the lower lumbar spine. Remainder the exam is unremarkable. IMPRESSION: Nonobstructive bowel gas pattern with mild fecal retention over the right colon and rectosigmoid colon. Electronically Signed   By: Marin Olp M.D.   On: 03/11/2019 15:51   Dg Esophagus W Single Cm (sol Or Thin Ba)  Result Date: 02/28/2019 CLINICAL DATA:  Difficulty swallowing solid foods. EXAM: ESOPHOGRAM/BARIUM SWALLOW TECHNIQUE: Single contrast examination was performed using  thin barium. FLUOROSCOPY TIME:  Fluoroscopy Time:  1 minutes 6 seconds. Radiation Exposure Index (if provided by the fluoroscopic device): 18.8 mGy Number of Acquired Spot Images: 0 COMPARISON:  None FINDINGS: Examination was performed in a modified fashion with the  patient in the semi upright LPO orientation. Initially the patient ingested thin barium. The esophagus is patent. There is no stricture or mass identified. No significant motility abnormality identified. The patient then ingested a 13 mm barium tablet which rapidly passed through the esophagus and in the stomach. IMPRESSION: 1. Patent esophagus. No stricture or mass. No finding to explain patient's difficulty swallowing. Electronically Signed   By: Kerby Moors M.D.   On: 02/28/2019 14:06    Microbiology: Recent Results (from the past 240 hour(s))  Culture, blood (routine x 2)     Status: None   Collection Time: 03/07/19  6:09 PM   Specimen: BLOOD  Result Value Ref Range Status   Specimen Description   Final    BLOOD LEFT ANTECUBITAL Performed at [facility] [address], [place], [place] [address]    Special Requests   Final    BOTTLES DRAWN AEROBIC AND ANAEROBIC Blood Culture adequate volume Performed at [facility], [address] [place], [place] [address]    Culture   Final    NO GROWTH 5 DAYS Performed at [facility], [place] [address], [place], [place] [address]    Report Status 03/12/2019 FINAL  Final  Culture, blood (routine x 2)     Status: None   Collection Time: 03/07/19  6:09 PM   Specimen: BLOOD  Result Value Ref Range Status   Specimen Description   Final    BLOOD RIGHT ANTECUBITAL Performed at [facility] [address], [place], [place] [address]    Special Requests   Final    BOTTLES DRAWN AEROBIC AND ANAEROBIC Blood Culture results may not be optimal due to an inadequate volume of blood received in culture bottles Performed at [facility] [address] [place], [place] [address]    Culture   Final    NO GROWTH 5 DAYS Performed at [facility], [place] [address] [place], [place] [address]    Report Status 03/12/2019 FINAL  Final  SARS Coronavirus 2 [facility]  order, Performed in [facility] hospital lab) Nasopharyngeal Nasopharyngeal Swab     Status: None   Collection Time: 03/07/19  7:12 PM   Specimen: Nasopharyngeal Swab  Result Value Ref Range Status   SARS Coronavirus 2 NEGATIVE NEGATIVE Final    Comment: (NOTE) If result is NEGATIVE SARS-CoV-2 target nucleic acids are NOT DETECTED. The SARS-CoV-2 RNA is generally detectable in upper and lower  respiratory specimens during the acute phase of infection. The lowest  concentration of SARS-CoV-2 viral copies this assay can detect is 250  copies / mL. A negative result does not preclude SARS-CoV-2 infection  and should not be used as the sole basis for treatment or other  patient management decisions.  A negative result may occur with  improper specimen collection / handling, submission of specimen other  than nasopharyngeal swab, presence of viral mutation(s) within the  areas targeted by this assay, and inadequate number of viral copies  (<250 copies / mL). A negative result must be combined with clinical  observations, patient history, and epidemiological information. If result is POSITIVE SARS-CoV-2 target nucleic acids are DETECTED. The SARS-CoV-2 RNA is generally detectable in upper and lower  respiratory specimens dur ing the acute phase of infection.  Positive  results are indicative of active infection with SARS-CoV-2.  Clinical  correlation with patient history and other diagnostic information is  necessary to determine patient infection status.  Positive results do  not rule out bacterial infection or co-infection with other viruses. If result is PRESUMPTIVE POSTIVE SARS-CoV-2 nucleic acids MAY BE PRESENT.   A presumptive positive result was obtained on the submitted specimen  and confirmed on repeat testing.  While 2019 novel coronavirus  (SARS-CoV-2) nucleic acids may be present in the submitted sample  additional confirmatory testing may be necessary for epidemiological  and  / or clinical management purposes  to differentiate between  SARS-CoV-2 and other Sarbecovirus currently known to infect humans.  If clinically indicated additional testing with an alternate test  methodology [phone]) is advised. The SARS-CoV-2 RNA is generally  detectable in upper and lower respiratory sp ecimens during the acute  phase of infection. The expected result is Negative. Fact Sheet for Patients:  StrictlyIdeas.no Fact Sheet for Healthcare Providers: BankingDealers.co.za This test is not yet approved or cleared by the [place] FDA and has been authorized for detection and/or diagnosis of SARS-CoV-2 by FDA under an Emergency  Use Authorization (EUA).  This EUA will remain in effect (meaning this test can be used) for the duration of the COVID-19 declaration under Section 564(b)(1) of the Act, 21 U.S.C. section 360bbb-3(b)(1), unless the authorization is terminated or revoked sooner. Performed at [facility], [place] [address] [place], [place] [address]      Labs: CBC: Recent Labs  Lab 03/07/19 1809 03/08/19 0201 03/09/19 0212 03/10/19 0416 03/11/19 0419 03/12/19 0454  WBC 9.6 9.7 7.8 10.3 10.3 8.5  NEUTROABS 6.7  --  3.9  --   --   --   HGB 16.4* 16.3* 16.4* 17.3* 17.5* 16.9*  HCT 63.8* 63.8* 63.6* 66.2* 66.3* 65.7*  MCV 89.4 89.1 88.8 88.4 87.4 88.9  PLT 197 184 202 183 187 974   Basic Metabolic Panel: Recent Labs  Lab 03/08/19 0201 03/09/19 0212 03/10/19 0416 03/11/19 0419 03/12/19 0454  NA 141 145 144 142 143  K 3.6 3.7 3.5 3.6 4.1  CL 91* 89* 90* 89* 92*  CO2 43* 39* 43* 40* 40*  GLUCOSE 218* 139* 106* 135* 130*  BUN _0 CREATININE 0.66 0.74 0.60 0.62 0.63  CALCIUM 8.6* 8.4* 9.4 9.3 8.9  MG  --  1.9 1.9 2.1 2.1   Liver Function Tests: Recent Labs  Lab 03/07/19 1809 03/09/19 0212  AST 17 14*  ALT 23 18  ALKPHOS 60 56  BILITOT 0.7 0.7  PROT 6.2* 6.3*  ALBUMIN  3.2* 3.1*   No results for input(s): LIPASE, AMYLASE in the last 168 hours. No results for input(s): AMMONIA in the last 168 hours. Cardiac Enzymes: No results for input(s): CKTOTAL, CKMB, CKMBINDEX, TROPONINI in the last 168 hours. BNP (last 3 results) Recent Labs    08/02/18 1557 02/26/19 2110 03/07/19 1809  BNP 319.7* 246.7* 79.9   CBG: Recent Labs  Lab 03/08/19 0731 03/09/19 0749 03/10/19 0742 03/11/19 0750 03/12/19 0741  GLUCAP 108* 155* 100* 108* 125*    Time spent: 35 minutes  Signed:  Berle Mull  [facility] 03/12/2019 4:55 PM

## 2019-03-15 ENCOUNTER — Ambulatory Visit: Payer: Medicaid Other | Admitting: Physical Therapy

## 2019-03-20 NOTE — Telephone Encounter (Signed)
HFU sched w/ Derl Barrow for 9/29 @ 11:30.

## 2019-03-27 ENCOUNTER — Ambulatory Visit: Payer: Medicaid Other | Admitting: Physical Therapy

## 2019-03-27 ENCOUNTER — Telehealth: Payer: Self-pay | Admitting: Physical Therapy

## 2019-03-27 NOTE — Telephone Encounter (Signed)
Pt no show for PT appointment today. They where contacted to nform of this. Used the number listed in Epic but no answer and not able to leave voicemail. They do not have any more visits scheduled at this time.   Elsie Ra, PT, DPT 03/27/19 11:37 AM

## 2019-03-28 ENCOUNTER — Other Ambulatory Visit: Payer: Self-pay

## 2019-03-28 ENCOUNTER — Encounter: Payer: Self-pay | Admitting: Primary Care

## 2019-03-28 ENCOUNTER — Ambulatory Visit (INDEPENDENT_AMBULATORY_CARE_PROVIDER_SITE_OTHER): Payer: Medicaid Other | Admitting: Primary Care

## 2019-03-28 VITALS — BP 152/100 | HR 73 | Temp 98.2°F | Ht 64.0 in | Wt 219.0 lb

## 2019-03-28 DIAGNOSIS — J84115 Respiratory bronchiolitis interstitial lung disease: Secondary | ICD-10-CM | POA: Diagnosis not present

## 2019-03-28 DIAGNOSIS — J9611 Chronic respiratory failure with hypoxia: Secondary | ICD-10-CM | POA: Diagnosis not present

## 2019-03-28 DIAGNOSIS — Z79899 Other long term (current) drug therapy: Secondary | ICD-10-CM

## 2019-03-28 DIAGNOSIS — G4733 Obstructive sleep apnea (adult) (pediatric): Secondary | ICD-10-CM | POA: Diagnosis not present

## 2019-03-28 DIAGNOSIS — Z8669 Personal history of other diseases of the nervous system and sense organs: Secondary | ICD-10-CM

## 2019-03-28 NOTE — Patient Instructions (Addendum)
  Recommendations: - Continue SYMBICORT twice daily - Continue Albuterol as needed every 4-6 hours for shortness of breath - Continue Prednisone 25m daily  - Make sure to wear OXYGEN 24/7 4L - Please reschedule your physical therapy appointment from yesterday   Referral: - Home nursing eval/treat - medication management   Orders: - In-lab sleep study re: sleep apnea   Follow-up: 4 weeks with new [facility] pulmonary provider (RB-ILD) Needs PFTs

## 2019-03-28 NOTE — Progress Notes (Signed)
_0  ID: Diane Gilbert, female    DOB: [birth year], 53 y.o.   MRN: [id number]  Chief Complaint  Patient presents with   Follow-up    Pt here for HFU. Pt ambulated back to room on 2lpm and had a spo2 of 88% upon entrance to exam room. Pt states her breathing has slightly improved since discharge. Pt c/o prod cough with white mucus. Pt states she has chest discomfort when using her neb. Pt denies f/c/s.     Referring provider: Nolene Ebbs, MD  HPI: 53 year old female, current every day smoker (45 pack year hx). PMH significant for respiratory bronchiolitis interstitial lung disease, OSA, chronic respiratory failure with hypoxia, chronic diastolic CHF, schizophrenia. Patient of Dr. Lake Gilbert, last seen in September 2019. Maintained on Symbicort 1 puffs twice daily. Advised to quit smoking altogether. PFTs in July 2019 that showed improvement. Needs 2L oxygen at rest and 4L on exertion.   Hospital course 09/8-09/13/20: Presented to ED after oxygen tubing kinked, O2 52% by EMS. CXR showed mild pulmonary edema. Head CT showed left occipital infarction that appeared subacute to chronic. MRI unremarkable. Diuresed and discharged on oral lasix 56m BID. Continues prednisone 16mdaily. Weight down to 218 (from 226)  03/28/2019 Patient presents for hospital follow-up. She is alone during today's visit, her friend Diane Rinkss her emergency contact. She is doing ok, her breathings is a little better. Weight is stable. Wearing 2L today O2 was 88%. Placed on 4L and oxygen improved. She has trouble remembering to always wear her oxygen. She has some confusion regarding her medications. She is unsure of what inhalers she should be taking, she has not been using her Symbicort recently. It is difficulty to know what she is and isn't taking. Compliance is an ongoing issue. Hx OSA, previously non-compliant with CPAP. Needs outpatient sleep study, consider nasal mask. Ambulating with rolling walker. She has  an aid that comes every day in the morning to help out. [facility] She was attending physical therapy but states that she hasn't since being in the hospital.   Significant testing: HRCT 12/2013: Mild GG, centrilobular nodularity  PFTs 2015: FEV1 1.48 (56%), but normal ratio. No BD response. TLC 60%, mild airtrapping. DLCO 45%, but corrects to normal with Av March 2017 pulmonary function testing ratio 85%, FVC 1.61 L (50% predicted) sees, FEV1 1.37 L (53% predicted), total lung capacity 3.14 L (58% predicted), DLCO 13.3 (49% predicted). July 2019  Bronch 03/2014: BAL with 85 WBC's, 21% eos. TBBX with normal lung parenchyma, but not enough for possible specific diagnosis VATS BX 06/2014: RB-ILD (severe), ??? Component NSIP included in the differential  HP panel negative 03/2014 Autoimmune labs normal 07/2014  6MW March 2017 6 minute walk distance 144 m, O2 saturation nadir 81% on room air  Allergies  Allergen Reactions   Unasyn [Ampicillin-Sulbactam Sodium] Swelling and Rash    angioedema   Contrast Media [Iodinated Diagnostic Agents] Nausea And Vomiting   Hydralazine Swelling    Immunization History  Administered Date(s) Administered   Influenza Split 04/03/2016, 04/30/2017   Influenza,inj,Quad PF,6+ Mos 03/12/2013, 06/07/2014, 05/13/2015, 07/26/2017, 03/16/2018, 03/10/2019   Pneumococcal Polysaccharide-23 07/27/2017    Past Medical History:  Diagnosis Date   [place] chronic [place] with [place] ([place]   [place]   Alcohol abuse    Anxiety    Arthritis    CHF (  congestive heart failure) ([place])    Community acquired pneumonia    Depression    Headache(784.0)    Hypertension    Insomnia    Interstitial lung disease ([place])    Oxygen dependent    Respiratory failure, acute and chronic ([place]) 05/09/2017    Schizophrenia (HCC)    Shortness of breath dyspnea    Sleep apnea    Sleep apnea     Tobacco History: Social History   Tobacco Use  Smoking Status Current Every Day Smoker   Packs/day: 1.50   Years: 30.00   Pack years: 45.00   Types: Cigarettes  Smokeless Tobacco Never Used  Tobacco Comment   smoking 3 cigarettes daily "every now and then" 11/04/16   Ready to quit: Not Answered Counseling given: Not Answered Comment: smoking 3 cigarettes daily "every now and then" 11/04/16   Outpatient Medications Prior to Visit  Medication Sig Dispense Refill   albuterol (PROVENTIL HFA;VENTOLIN HFA) 108 (90 BASE) MCG/ACT inhaler Inhale 2 puffs into the lungs every 6 (six) hours as needed for wheezing or shortness of breath. 1 Inhaler 3   albuterol (PROVENTIL) (2.5 MG/3ML) 0.083% nebulizer solution Take 3 mLs (2.5 mg total) by nebulization every 6 (six) hours as needed for wheezing or shortness of breath. 75 mL 2   ARIPiprazole (ABILIFY) 5 MG tablet Take 5 mg by mouth daily.     aspirin EC 81 MG EC tablet Take 1 tablet (81 mg total) by mouth daily. 30 tablet 0   buPROPion (WELLBUTRIN SR) 150 MG 12 hr tablet Take 150 mg by mouth daily.     diphenhydrAMINE (BENADRYL) 25 mg capsule Take 1 capsule (25 mg total) by mouth every 6 (six) hours as needed for itching. 30 capsule 0   famotidine (PEPCID) 20 MG tablet Take 1 tablet (20 mg total) by mouth at bedtime. 30 tablet 0   furosemide (LASIX) 40 MG tablet Take 1 tablet (40 mg total) by mouth 2 (two) times daily. 30 tablet 0   gabapentin (NEURONTIN) 300 MG capsule Take 300 mg by mouth 3 (three) times daily.     losartan (COZAAR) 50 MG tablet Take 1 tablet (50 mg total) by mouth daily. 30 tablet 0   Multiple Vitamin (MULTIVITAMIN WITH MINERALS) TABS tablet Take 1 tablet by mouth daily. 30 tablet 0   nicotine polacrilex (NICORETTE) 2 MG gum Take 2 mg by mouth as needed for smoking cessation.     OXYGEN Inhale 4 L into the lungs continuous.       polyethylene glycol (MIRALAX / GLYCOLAX) 17 g packet Take 17 g by mouth daily. 14 each 0   predniSONE (DELTASONE) 10 MG tablet Take 1 tablet (10 mg total) by mouth daily with breakfast. (Patient taking differently: Take 10 mg by mouth daily with breakfast. Currently taking 68m from taper at hospital discharge 9/29) 30 tablet 0   QUEtiapine (SEROQUEL) 100 MG tablet Take 100 mg by mouth every morning.     QUEtiapine (SEROQUEL) 200 MG tablet Take 200 mg by mouth at bedtime.     rosuvastatin (CRESTOR) 10 MG tablet Take 1 tablet (10 mg total) by mouth daily. 30 tablet 1   traZODone (DESYREL) 100 MG tablet Take 100 mg by mouth at bedtime.     amLODipine (NORVASC) 5 MG tablet Take 5 mg by mouth daily.     budesonide-formoterol (SYMBICORT) 160-4.5 MCG/ACT inhaler INHALE TWO puffs into THE lungs TWICE DAILY (Patient not taking: Reported on 03/28/2019) 10.2 g 5  metoprolol succinate (TOPROL-XL) 25 MG 24 hr tablet Take 1 tablet (25 mg total) by mouth daily. (Patient not taking: Reported on 03/28/2019) 30 tablet 0   No facility-administered medications prior to visit.    Review of Systems  Review of Systems  Constitutional: Negative.   HENT: Negative.   Respiratory: Positive for cough and shortness of breath. Negative for wheezing.   Musculoskeletal:       Deconditioning, right foot pins/needles  Psychiatric/Behavioral: Positive for sleep disturbance. Negative for agitation, behavioral problems and suicidal ideas.   Physical Exam  BP (!) 152/100 (BP Location: Left Arm, Cuff Size: Normal)    Pulse 73    Temp 98.2 F (36.8 C) (Oral)    Ht _0  (1.626 m)    Wt 219 lb (99.3 kg)    LMP 04/06/2017    SpO2 95%    BMI 37.59 kg/m  Physical Exam Constitutional:      Appearance: Normal appearance.  HENT:     Head: Normocephalic and atraumatic.     Mouth/Throat:     Mouth: Mucous membranes are moist.     Pharynx: Oropharynx is clear.  Cardiovascular:     Rate and Rhythm: Normal rate and  regular rhythm.  Pulmonary:     Effort: Pulmonary effort is normal. No respiratory distress.     Comments: Crackles bilateral bases; home oxygen tank  Musculoskeletal: Normal range of motion.     Comments: Rolling walker  Neurological:     General: No focal deficit present.     Mental Status: She is alert and oriented to person, place, and time. Mental status is at baseline.  Psychiatric:        Mood and Affect: Mood normal.        Behavior: Behavior normal.        Thought Content: Thought content normal.        Judgment: Judgment normal.      Lab Results:  CBC    Component Value Date/Time   WBC 8.5 03/12/2019 0454   RBC 7.39 (H) 03/12/2019 0454   HGB 16.9 (H) 03/12/2019 0454   HCT 65.7 (H) 03/12/2019 0454   PLT 182 03/12/2019 0454   MCV 88.9 03/12/2019 0454   MCH 22.9 (L) 03/12/2019 0454   MCHC 25.7 (L) 03/12/2019 0454   RDW 22.2 (H) 03/12/2019 0454   LYMPHSABS 3.0 03/09/2019 0212   MONOABS 0.8 03/09/2019 0212   EOSABS 0.1 03/09/2019 0212   BASOSABS 0.0 03/09/2019 0212    BMET    Component Value Date/Time   NA 143 03/12/2019 0454   K 4.1 03/12/2019 0454   CL 92 (L) 03/12/2019 0454   CO2 40 (H) 03/12/2019 0454   GLUCOSE 130 (H) 03/12/2019 0454   BUN 13 03/12/2019 0454   CREATININE 0.63 03/12/2019 0454   CALCIUM 8.9 03/12/2019 0454   GFRNONAA >60 03/12/2019 0454   GFRAA >60 03/12/2019 0454    BNP    Component Value Date/Time   BNP 79.9 03/07/2019 1809    ProBNP    Component Value Date/Time   PROBNP 12.0 09/06/2015 1404    Imaging: Dg Chest 2 View  Result Date: 03/07/2019 CLINICAL DATA:  Shortness of breath EXAM: CHEST - 2 VIEW COMPARISON:  02/22/2019 FINDINGS: Cardiomegaly with vascular congestion. Diffuse interstitial and alveolar opacities concerning for edema. No effusions. No acute bony abnormality. IMPRESSION: Cardiomegaly with vascular congestion and concern for mild pulmonary edema. Electronically Signed   By: Rolm Baptise M.D.   On: 03/07/2019  19:00   Dg Ankle Complete Right  Result Date: 03/07/2019 CLINICAL DATA:  Fall with ankle pain EXAM: RIGHT ANKLE - COMPLETE 3+ VIEW COMPARISON:  None. FINDINGS: No fracture or malalignment. Ankle mortise is symmetric. Soft tissues are unremarkable. IMPRESSION: No acute osseous abnormality Electronically Signed   By: Donavan Foil M.D.   On: 03/07/2019 19:01   Ct Head Wo Contrast  Result Date: 03/07/2019 CLINICAL DATA:  Fall 3 times today. EXAM: CT HEAD WITHOUT CONTRAST TECHNIQUE: Contiguous axial images were obtained from the base of the skull through the vertex without intravenous contrast. COMPARISON:  None. FINDINGS: Brain: Infarcts noted in the left occipital lobe which appears subacute to chronic. No acute intracranial abnormality. Specifically, no hemorrhage, hydrocephalus, mass lesion, acute infarction, or significant intracranial injury. Mild chronic small vessel disease throughout the deep white matter. Vascular: No hyperdense vessel or unexpected calcification. Skull: No acute calvarial abnormality. Sinuses/Orbits: Visualized paranasal sinuses and mastoids clear. Orbital soft tissues unremarkable. 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 Chronic small vessel disease throughout the deep white matter. Electronically Signed   By: Rolm Baptise M.D.   On: 03/07/2019 19:36   Mr Brain Wo Contrast  Result Date: 03/08/2019 CLINICAL DATA:  53 year old female with recent falls. Age indeterminate left occipital infarct on head CT yesterday. EXAM: MRI HEAD WITHOUT CONTRAST TECHNIQUE: Multiplanar, multiecho pulse sequences of the brain and surrounding structures were obtained without intravenous contrast. COMPARISON:  Head CT 03/07/2019. FINDINGS: Study is intermittently degraded by motion artifact despite repeated imaging attempts. Brain: Cortical encephalomalacia in the left occipital pole and lateral aspect of the left occipital lobe is associated with facilitated  diffusion. No convincing restricted diffusion to suggest acute infarction. Chronic appearing lacunar infarcts in both globus pallidus. Scattered and patchy bilateral cerebral white matter T2 and FLAIR hyperintensity which is moderately advanced for age. Small chronic infarct in the right cerebellum (series 5, image 7). No other cortical encephalomalacia. No chronic cerebral blood products identified. No midline shift, mass effect, evidence of mass lesion, ventriculomegaly, extra-axial collection or acute intracranial hemorrhage. Cervicomedullary junction and pituitary are within normal limits. Vascular: Major intracranial vascular flow voids are preserved. Skull and upper cervical spine: Grossly negative visible cervical spine. Grossly normal bone marrow signal. Sinuses/Orbits: Negative orbits aside from suggestion of increased intraorbital fat bilaterally. Only trace paranasal sinus mucosal thickening. Other: Mastoids are clear. Scalp and face soft tissues appear negative. IMPRESSION: 1. Intermittently motion degraded exam with no acute intracranial abnormality. 2. Chronic left PCA territory infarct. Chronic small vessel infarcts in the bilateral globus pallidus, the right cerebellum, and probably also likely scattered in the cerebral white matter. 3. 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 Electronically Signed   By: Genevie Ann M.D.   On: 03/08/2019 15:13   Dg Chest Port 1 View  Result Date: 02/28/2019 CLINICAL DATA:  Pulmonary edema. EXAM: PORTABLE CHEST 1 VIEW COMPARISON:  View chest x-ray 02/26/2019.  CT chest 02/22/2019 FINDINGS: The heart is enlarged. Interstitial and airspace opacities demonstrates slight increase since the prior exam. Left greater than right basilar airspace opacities are present. Left pleural effusion is suspected. IMPRESSION: 1. Cardiomegaly and increasing interstitial edema consistent with congestive heart failure. 2. Left  greater than right pleural effusions and associated airspace disease. While this likely reflects atelectasis, infection is not excluded. Electronically Signed   By: San Morelle M.D.   On: 02/28/2019 07:05   Dg Abd Portable 1v  Result  Date: 03/11/2019 CLINICAL DATA:  Left-sided abdominal pain after fall 3 days ago. EXAM: PORTABLE ABDOMEN - 1 VIEW COMPARISON:  None. FINDINGS: Bowel gas pattern is nonobstructive with mild fecal retention over the right colon and rectosigmoid colon. No free peritoneal air. Fusion hardware intact over the lower lumbar spine. Remainder the exam is unremarkable. IMPRESSION: Nonobstructive bowel gas pattern with mild fecal retention over the right colon and rectosigmoid colon. Electronically Signed   By: Marin Olp M.D.   On: 03/11/2019 15:51   Dg Esophagus W Single Cm (sol Or Thin Ba)  Result Date: 02/28/2019 CLINICAL DATA:  Difficulty swallowing solid foods. EXAM: ESOPHOGRAM/BARIUM SWALLOW TECHNIQUE: Single contrast examination was performed using  thin barium. FLUOROSCOPY TIME:  Fluoroscopy Time:  1 minutes 6 seconds. Radiation Exposure Index (if provided by the fluoroscopic device): 18.8 mGy Number of Acquired Spot Images: 0 COMPARISON:  None FINDINGS: Examination was performed in a modified fashion with the patient in the semi upright LPO orientation. Initially the patient ingested thin barium. The esophagus is patent. There is no stricture or mass identified. No significant motility abnormality identified. The patient then ingested a 13 mm barium tablet which rapidly passed through the esophagus and in the stomach. IMPRESSION: 1. Patent esophagus. No stricture or mass. No finding to explain patient's difficulty swallowing. Electronically Signed   By: Kerby Moors M.D.   On: 02/28/2019 14:06     Assessment & Plan:   Chronic respiratory failure with hypoxia (HCC) - Compliance is an ongoing issue - Assessed barrier to oxygen use and patient states that she  forgets to put it on - Reinforced importance of wearing oxygen 4L 24/7 - Ordering home nursing for eval/treat and medication medication   Respiratory bronchiolitis interstitial lung disease ([place]) - Patient continues to smoke - Strongly encouraged smoking cessation altogether - Continue Symbicort 160 twice daily; prn albuterol hfa q 6 hours - Continue Prednisone 42m daily  - Needs repeat PFTs  - FU with new pulmonary provider in 4 weeks (former [facility] patient)  Falls - Ambulating with rolling walker - Encouraged patient to return to physical therapy   Hx of sleep apnea - In-lab sleep study re: sleep apnea    EMartyn Ehrich NP 03/29/2019

## 2019-03-29 ENCOUNTER — Emergency Department (HOSPITAL_COMMUNITY): Payer: Medicaid Other

## 2019-03-29 ENCOUNTER — Inpatient Hospital Stay (HOSPITAL_COMMUNITY)
Admission: EM | Admit: 2019-03-29 | Discharge: 2019-04-03 | DRG: 917 | Disposition: A | Discharge status: Home / Self Care | Payer: Medicaid Other | Attending: Internal Medicine | Admitting: Internal Medicine

## 2019-03-29 ENCOUNTER — Encounter: Payer: Self-pay | Admitting: Primary Care

## 2019-03-29 ENCOUNTER — Other Ambulatory Visit: Payer: Self-pay

## 2019-03-29 DIAGNOSIS — G40901 Epilepsy, unspecified, not intractable, with status epilepticus: Secondary | ICD-10-CM | POA: Diagnosis not present

## 2019-03-29 DIAGNOSIS — E119 Type 2 diabetes mellitus without complications: Secondary | ICD-10-CM | POA: Diagnosis present

## 2019-03-29 DIAGNOSIS — I161 Hypertensive emergency: Secondary | ICD-10-CM | POA: Diagnosis present

## 2019-03-29 DIAGNOSIS — I5032 Chronic diastolic (congestive) heart failure: Secondary | ICD-10-CM | POA: Diagnosis present

## 2019-03-29 DIAGNOSIS — Z803 Family history of malignant neoplasm of breast: Secondary | ICD-10-CM

## 2019-03-29 DIAGNOSIS — G92 Toxic encephalopathy: Secondary | ICD-10-CM | POA: Diagnosis present

## 2019-03-29 DIAGNOSIS — J9811 Atelectasis: Secondary | ICD-10-CM | POA: Diagnosis present

## 2019-03-29 DIAGNOSIS — Z881 Allergy status to other antibiotic agents status: Secondary | ICD-10-CM

## 2019-03-29 DIAGNOSIS — G40501 Epileptic seizures related to external causes, not intractable, with status epilepticus: Secondary | ICD-10-CM | POA: Diagnosis present

## 2019-03-29 DIAGNOSIS — Y92009 Unspecified place in unspecified non-institutional (private) residence as the place of occurrence of the external cause: Secondary | ICD-10-CM | POA: Diagnosis not present

## 2019-03-29 DIAGNOSIS — F1410 Cocaine abuse, uncomplicated: Secondary | ICD-10-CM | POA: Diagnosis present

## 2019-03-29 DIAGNOSIS — J9690 Respiratory failure, unspecified, unspecified whether with hypoxia or hypercapnia: Secondary | ICD-10-CM

## 2019-03-29 DIAGNOSIS — J440 Chronic obstructive pulmonary disease with acute lower respiratory infection: Secondary | ICD-10-CM | POA: Diagnosis present

## 2019-03-29 DIAGNOSIS — I493 Ventricular premature depolarization: Secondary | ICD-10-CM | POA: Diagnosis present

## 2019-03-29 DIAGNOSIS — Z7982 Long term (current) use of aspirin: Secondary | ICD-10-CM

## 2019-03-29 DIAGNOSIS — R402312 Coma scale, best motor response, none, at arrival to emergency department: Secondary | ICD-10-CM | POA: Diagnosis present

## 2019-03-29 DIAGNOSIS — Z9119 Patient's noncompliance with other medical treatment and regimen: Secondary | ICD-10-CM

## 2019-03-29 DIAGNOSIS — Z8669 Personal history of other diseases of the nervous system and sense organs: Secondary | ICD-10-CM | POA: Insufficient documentation

## 2019-03-29 DIAGNOSIS — J84115 Respiratory bronchiolitis interstitial lung disease: Secondary | ICD-10-CM

## 2019-03-29 DIAGNOSIS — Z7409 Other reduced mobility: Secondary | ICD-10-CM

## 2019-03-29 DIAGNOSIS — R402112 Coma scale, eyes open, never, at arrival to emergency department: Secondary | ICD-10-CM | POA: Diagnosis present

## 2019-03-29 DIAGNOSIS — T405X1A Poisoning by cocaine, accidental (unintentional), initial encounter: Principal | ICD-10-CM | POA: Diagnosis present

## 2019-03-29 DIAGNOSIS — Z825 Family history of asthma and other chronic lower respiratory diseases: Secondary | ICD-10-CM

## 2019-03-29 DIAGNOSIS — Z79899 Other long term (current) drug therapy: Secondary | ICD-10-CM

## 2019-03-29 DIAGNOSIS — Z20828 Contact with and (suspected) exposure to other viral communicable diseases: Secondary | ICD-10-CM | POA: Diagnosis present

## 2019-03-29 DIAGNOSIS — Z8673 Personal history of transient ischemic attack (TIA), and cerebral infarction without residual deficits: Secondary | ICD-10-CM

## 2019-03-29 DIAGNOSIS — I110 Hypertensive heart disease with heart failure: Secondary | ICD-10-CM | POA: Diagnosis present

## 2019-03-29 DIAGNOSIS — F209 Schizophrenia, unspecified: Secondary | ICD-10-CM | POA: Diagnosis present

## 2019-03-29 DIAGNOSIS — G4733 Obstructive sleep apnea (adult) (pediatric): Secondary | ICD-10-CM | POA: Diagnosis present

## 2019-03-29 DIAGNOSIS — Z9981 Dependence on supplemental oxygen: Secondary | ICD-10-CM | POA: Diagnosis not present

## 2019-03-29 DIAGNOSIS — Z888 Allergy status to other drugs, medicaments and biological substances status: Secondary | ICD-10-CM

## 2019-03-29 DIAGNOSIS — G9340 Encephalopathy, unspecified: Secondary | ICD-10-CM

## 2019-03-29 DIAGNOSIS — R402212 Coma scale, best verbal response, none, at arrival to emergency department: Secondary | ICD-10-CM | POA: Diagnosis present

## 2019-03-29 DIAGNOSIS — R569 Unspecified convulsions: Secondary | ICD-10-CM

## 2019-03-29 DIAGNOSIS — F17210 Nicotine dependence, cigarettes, uncomplicated: Secondary | ICD-10-CM | POA: Diagnosis present

## 2019-03-29 DIAGNOSIS — Z91041 Radiographic dye allergy status: Secondary | ICD-10-CM

## 2019-03-29 DIAGNOSIS — F1490 Cocaine use, unspecified, uncomplicated: Secondary | ICD-10-CM

## 2019-03-29 DIAGNOSIS — F1010 Alcohol abuse, uncomplicated: Secondary | ICD-10-CM | POA: Diagnosis present

## 2019-03-29 DIAGNOSIS — J9621 Acute and chronic respiratory failure with hypoxia: Secondary | ICD-10-CM | POA: Diagnosis present

## 2019-03-29 DIAGNOSIS — Z6841 Body Mass Index (BMI) 40.0 and over, adult: Secondary | ICD-10-CM | POA: Diagnosis not present

## 2019-03-29 LAB — POCT I-STAT 7, (LYTES, BLD GAS, ICA,H+H)
Acid-Base Excess: 8 mmol/L — ABNORMAL HIGH (ref 0.0–2.0)
Bicarbonate: 37.4 mmol/L — ABNORMAL HIGH (ref 20.0–28.0)
Calcium, Ion: 1.22 mmol/L (ref 1.15–1.40)
HCT: 61 % — ABNORMAL HIGH (ref 36.0–46.0)
Hemoglobin: 20.7 g/dL — ABNORMAL HIGH (ref 12.0–15.0)
O2 Saturation: 92 %
Potassium: 3.2 mmol/L — ABNORMAL LOW (ref 3.5–5.1)
Sodium: 140 mmol/L (ref 135–145)
TCO2: 39 mmol/L — ABNORMAL HIGH (ref 22–32)
pCO2 arterial: 65.9 mmHg (ref 32.0–48.0)
pH, Arterial: 7.362 (ref 7.350–7.450)
pO2, Arterial: 69 mmHg — ABNORMAL LOW (ref 83.0–108.0)

## 2019-03-29 LAB — RAPID URINE DRUG SCREEN, HOSP PERFORMED
Amphetamines: NOT DETECTED
Barbiturates: NOT DETECTED
Benzodiazepines: POSITIVE — AB
Cocaine: POSITIVE — AB
Opiates: NOT DETECTED
Tetrahydrocannabinol: NOT DETECTED

## 2019-03-29 LAB — CBC
HCT: 63.9 % — ABNORMAL HIGH (ref 36.0–46.0)
Hemoglobin: 18 g/dL — ABNORMAL HIGH (ref 12.0–15.0)
MCH: 24 pg — ABNORMAL LOW (ref 26.0–34.0)
MCHC: 28.2 g/dL — ABNORMAL LOW (ref 30.0–36.0)
MCV: 85.2 fL (ref 80.0–100.0)
Platelets: 197 10*3/uL (ref 150–400)
RBC: 7.5 MIL/uL — ABNORMAL HIGH (ref 3.87–5.11)
RDW: 20.1 % — ABNORMAL HIGH (ref 11.5–15.5)
WBC: 10.4 10*3/uL (ref 4.0–10.5)
nRBC: 0 % (ref 0.0–0.2)

## 2019-03-29 LAB — DIFFERENTIAL
Abs Immature Granulocytes: 0.02 10*3/uL (ref 0.00–0.07)
Basophils Absolute: 0.1 10*3/uL (ref 0.0–0.1)
Basophils Relative: 1 %
Eosinophils Absolute: 0.1 10*3/uL (ref 0.0–0.5)
Eosinophils Relative: 1 %
Immature Granulocytes: 0 %
Lymphocytes Relative: 48 %
Lymphs Abs: 5 10*3/uL — ABNORMAL HIGH (ref 0.7–4.0)
Monocytes Absolute: 0.4 10*3/uL (ref 0.1–1.0)
Monocytes Relative: 4 %
Neutro Abs: 4.8 10*3/uL (ref 1.7–7.7)
Neutrophils Relative %: 46 %

## 2019-03-29 LAB — URINALYSIS, ROUTINE W REFLEX MICROSCOPIC
Bilirubin Urine: NEGATIVE
Glucose, UA: NEGATIVE mg/dL
Hgb urine dipstick: NEGATIVE
Ketones, ur: NEGATIVE mg/dL
Leukocytes,Ua: NEGATIVE
Nitrite: NEGATIVE
Protein, ur: 100 mg/dL — AB
Specific Gravity, Urine: 1.028 (ref 1.005–1.030)
pH: 7 (ref 5.0–8.0)

## 2019-03-29 LAB — COMPREHENSIVE METABOLIC PANEL
ALT: 27 U/L (ref 0–44)
AST: 29 U/L (ref 15–41)
Albumin: 4.4 g/dL (ref 3.5–5.0)
Alkaline Phosphatase: 70 U/L (ref 38–126)
Anion gap: 18 — ABNORMAL HIGH (ref 5–15)
BUN: 9 mg/dL (ref 6–20)
CO2: 26 mmol/L (ref 22–32)
Calcium: 9.7 mg/dL (ref 8.9–10.3)
Chloride: 94 mmol/L — ABNORMAL LOW (ref 98–111)
Creatinine, Ser: 0.77 mg/dL (ref 0.44–1.00)
GFR calc Af Amer: >60 mL/min (ref >60)
GFR calc non Af Amer: >60 mL/min (ref >60)
Glucose, Bld: 160 mg/dL — ABNORMAL HIGH (ref 70–99)
Potassium: 3.8 mmol/L (ref 3.5–5.1)
Sodium: 138 mmol/L (ref 135–145)
Total Bilirubin: 0.8 mg/dL (ref 0.3–1.2)
Total Protein: 8.6 g/dL — ABNORMAL HIGH (ref 6.5–8.1)

## 2019-03-29 LAB — I-STAT BETA HCG BLOOD, ED (MC, WL, AP ONLY): I-stat hCG, quantitative: <5 m[IU]/mL (ref <5)

## 2019-03-29 LAB — SARS CORONAVIRUS 2 BY RT PCR (HOSPITAL ORDER, PERFORMED IN ~~LOC~~ HOSPITAL LAB): SARS Coronavirus 2: NEGATIVE

## 2019-03-29 LAB — TROPONIN I (HIGH SENSITIVITY): Troponin I (High Sensitivity): 17 ng/L (ref <18)

## 2019-03-29 LAB — PHOSPHORUS: Phosphorus: 4.2 mg/dL (ref 2.5–4.6)

## 2019-03-29 LAB — CBG MONITORING, ED: Glucose-Capillary: 158 mg/dL — ABNORMAL HIGH (ref 70–99)

## 2019-03-29 LAB — LACTIC ACID, PLASMA
Lactic Acid, Venous: 7.3 mmol/L (ref 0.5–1.9)
Lactic Acid, Venous: 2.7 mmol/L (ref 0.5–1.9)

## 2019-03-29 LAB — ETHANOL: Alcohol, Ethyl (B): <10 mg/dL (ref <10)

## 2019-03-29 LAB — TSH: TSH: 0.615 u[IU]/mL (ref 0.350–4.500)

## 2019-03-29 LAB — APTT: aPTT: 28 seconds (ref 24–36)

## 2019-03-29 LAB — MAGNESIUM: Magnesium: 1.8 mg/dL (ref 1.7–2.4)

## 2019-03-29 LAB — T4, FREE: Free T4: 0.98 ng/dL (ref 0.61–1.12)

## 2019-03-29 MED ORDER — LEVETIRACETAM IN NACL 1000 MG/100ML IV SOLN
1000.0000 mg | Freq: Two times a day (BID) | INTRAVENOUS | Status: DC
  Administered 2019-03-29 – 2019-04-01 (×7): 1000 mg via INTRAVENOUS
  Filled 2019-03-29 (×9): qty 100

## 2019-03-29 MED ORDER — METRONIDAZOLE IN NACL 5-0.79 MG/ML-% IV SOLN: 500.0000 mg | Freq: Three times a day (TID) | INTRAVENOUS | Status: DC

## 2019-03-29 MED ORDER — PROPOFOL 1000 MG/100ML IV EMUL
0.0000 ug/kg/min | INTRAVENOUS | Status: DC
  Administered 2019-03-30: 04:00:00 30 ug/kg/min via INTRAVENOUS
  Filled 2019-03-29: qty 100

## 2019-03-29 MED ORDER — PROPOFOL 1000 MG/100ML IV EMUL
INTRAVENOUS | Status: AC
  Administered 2019-03-29: 23:00:00
  Filled 2019-03-29: qty 100

## 2019-03-29 MED ORDER — NALOXONE HCL 0.4 MG/ML IJ SOLN
INTRAMUSCULAR | Status: AC
  Administered 2019-03-29: 19:00:00
  Filled 2019-03-29: qty 1

## 2019-03-29 MED ORDER — HEPARIN SODIUM (PORCINE) 5000 UNIT/ML IJ SOLN
5000.0000 [IU] | Freq: Three times a day (TID) | INTRAMUSCULAR | Status: DC
  Administered 2019-03-30 – 2019-04-03 (×13): 5000 [IU] via SUBCUTANEOUS
  Filled 2019-03-29 (×13): qty 1

## 2019-03-29 MED ORDER — METRONIDAZOLE IN NACL 5-0.79 MG/ML-% IV SOLN
500.0000 mg | Freq: Once | INTRAVENOUS | Status: AC
  Administered 2019-03-29: 21:00:00 500 mg via INTRAVENOUS
  Filled 2019-03-29: qty 100

## 2019-03-29 MED ORDER — PROPOFOL 1000 MG/100ML IV EMUL
INTRAVENOUS | Status: AC
  Administered 2019-03-29 (×2)
  Filled 2019-03-29: qty 100

## 2019-03-29 MED ORDER — FENTANYL CITRATE (PF) 100 MCG/2ML IJ SOLN
50.0000 ug | Freq: Once | INTRAMUSCULAR | Status: AC
  Administered 2019-03-29: 50 ug via INTRAVENOUS
  Filled 2019-03-29: qty 2

## 2019-03-29 MED ORDER — SODIUM CHLORIDE 0.9 % IV SOLN
2.0000 g | Freq: Three times a day (TID) | INTRAVENOUS | Status: DC
  Administered 2019-03-29: 22:00:00 2 g via INTRAVENOUS
  Filled 2019-03-29: qty 2

## 2019-03-29 MED ORDER — BUDESONIDE 0.5 MG/2ML IN SUSP
0.5000 mg | Freq: Two times a day (BID) | RESPIRATORY_TRACT | Status: DC
  Administered 2019-03-30 – 2019-04-03 (×9): 0.5 mg via RESPIRATORY_TRACT
  Filled 2019-03-29 (×11): qty 2

## 2019-03-29 MED ORDER — IOHEXOL 300 MG/ML  SOLN
75.0000 mL | Freq: Once | INTRAMUSCULAR | Status: AC | PRN
  Administered 2019-03-29: 19:00:00 75 mL via INTRAVENOUS

## 2019-03-29 MED ORDER — LORAZEPAM 2 MG/ML IJ SOLN
INTRAMUSCULAR | Status: AC
  Filled 2019-03-29: qty 1

## 2019-03-29 MED ORDER — SODIUM CHLORIDE 0.9 % IV SOLN
INTRAVENOUS | Status: DC
  Administered 2019-03-30 (×3): via INTRAVENOUS

## 2019-03-29 MED ORDER — LEVETIRACETAM IN NACL 1000 MG/100ML IV SOLN
1000.0000 mg | INTRAVENOUS | Status: AC
  Administered 2019-03-29: 19:00:00 1000 mg via INTRAVENOUS

## 2019-03-29 MED ORDER — FAMOTIDINE 40 MG/5ML PO SUSR
20.0000 mg | Freq: Two times a day (BID) | ORAL | Status: DC
  Administered 2019-03-30: 20 mg
  Filled 2019-03-29 (×2): qty 2.5

## 2019-03-29 MED ORDER — IPRATROPIUM-ALBUTEROL 0.5-2.5 (3) MG/3ML IN SOLN
3.0000 mL | Freq: Four times a day (QID) | RESPIRATORY_TRACT | Status: DC
  Administered 2019-03-30 – 2019-03-31 (×6): 3 mL via RESPIRATORY_TRACT
  Filled 2019-03-29 (×7): qty 3

## 2019-03-29 MED ORDER — FENTANYL CITRATE (PF) 100 MCG/2ML IJ SOLN
50.0000 ug | INTRAMUSCULAR | Status: DC | PRN
  Administered 2019-03-30 (×2): 100 ug via INTRAVENOUS
  Administered 2019-03-30: 200 ug via INTRAVENOUS
  Administered 2019-03-30: 50 ug via INTRAVENOUS
  Filled 2019-03-29 (×6): qty 2

## 2019-03-29 MED ORDER — FENTANYL CITRATE (PF) 100 MCG/2ML IJ SOLN
50.0000 ug | INTRAMUSCULAR | Status: DC | PRN
  Administered 2019-03-30: 50 ug via INTRAVENOUS

## 2019-03-29 MED ORDER — INSULIN ASPART 100 UNIT/ML ~~LOC~~ SOLN
0.0000 [IU] | SUBCUTANEOUS | Status: DC
  Administered 2019-03-30: 08:00:00 2 [IU] via SUBCUTANEOUS

## 2019-03-29 NOTE — Assessment & Plan Note (Signed)
-  Ambulating with rolling walker - Encouraged patient to return to physical therapy

## 2019-03-29 NOTE — Assessment & Plan Note (Signed)
-  Compliance is an ongoing issue - Assessed barrier to oxygen use and patient states that she forgets to put it on - Reinforced importance of wearing oxygen 4L 24/7 - Ordering home nursing for eval/treat and medication medication

## 2019-03-29 NOTE — Progress Notes (Signed)
EEG complete - results pending.

## 2019-03-29 NOTE — Progress Notes (Signed)
Pharmacy Antibiotic Note  Diane Gilbert is a 53 y.o. female admitted on 03/29/2019 with aspiration pneumonia. Afebrile, WBC 10.4, Scr 0.77.  Pharmacy has been consulted for cefepime dosing.  Plan: Will start Cefepime 2 g IV q8h Monitor clinical improvement and renal function Monitor C/s   No data recorded.  Recent Labs  Lab 03/29/19 1804 03/29/19 1808  WBC 10.4  --   CREATININE 0.77  --   LATICACIDVEN  --  7.3*    Estimated Creatinine Clearance: 96.3 mL/min (by C-G formula based on SCr of 0.77 mg/dL).    Allergies  Allergen Reactions  . Unasyn [Ampicillin-Sulbactam Sodium] Swelling and Rash    angioedema  . Contrast Media [Iodinated Diagnostic Agents] Nausea And Vomiting  . Hydralazine Swelling    Antimicrobials this admission: Flagyl 9/30 >> Cefepime 9/30 >>  Dose adjustments this admission: N/A  Microbiology results: Pending    Thank you for allowing pharmacy to be a part of this patient's care.  Lorel Monaco, PharmD PGY1 Ambulatory Care Resident [employer] # [phone]

## 2019-03-29 NOTE — Consult Note (Addendum)
Neurology Consultation Reason for Consult: Seizure Referring Physician: Billy Fischer, E  CC: Seizure  History is obtained from: Patient  HPI: Diane Gilbert is a 53 y.o. female with a history of previous PCA stroke who presents with altered mental status and seizure.  She was found down by family member at home who returned from work.  She was last known to be well at 10:30 AM when a friend talk to her on the phone.  EMS was called, and in route she had a convulsive seizure and was given 2.5 of IV Versed.  On arrival, she had a severe right hemiparesis with right gaze.  Code stroke was activated.  She was given 1 g IV Keppra and taken for stat head CT which was negative.  CTA was also negative.   LKW: 1:30 pm.  tpa given?: no, outside of window   ROS: Unable to obtain due to altered mental status.   Past Medical History:  Diagnosis Date  . Acute on chronic respiratory failure with hypoxia ([place]) 06/07/2014  . Acute respiratory failure with hypoxia ([place]) 03/23/2017  . Alcohol abuse   . Anxiety   . Arthritis   . CHF (congestive heart failure) ([place])   . Community acquired pneumonia   . Depression   . Headache(784.0)   . Hypertension   . Insomnia   . Interstitial lung disease ([place])   . Oxygen dependent   . Respiratory failure, acute and chronic ([place]) 05/09/2017  . Schizophrenia ([place])   . Shortness of breath dyspnea   . Sleep apnea   . Sleep apnea      Family History  Problem Relation Age of Onset  . Breast cancer Mother   . Obstructive Sleep Apnea Mother   . COPD Maternal Aunt      Social History:  reports that she has been smoking cigarettes. She has a 45.00 pack-year smoking history. She has never used smokeless tobacco. She reports current alcohol use. She reports current drug use. Drug: Cocaine.   Exam: Current vital signs: LMP 04/06/2017   Vitals:   03/29/19 1900  BP: (!) 167/98  Pulse: (!) 102  Resp: 14  SpO2: 100%  Vital signs in last 24  hours:  Physical Exam  Constitutional: Appears well-developed and well-nourished.  Psych: does not respond Eyes: No scleral injection HENT: No OP obstrucion Head: Normocephalic.  Cardiovascular: Normal rate and regular rhythm.  Respiratory: Effort normal, non-labored breathing GI: Soft.  No distension. There is no tenderness.  Skin: WDI  Neuro: Mental Status: Patient is stuporous, she does not follow commands, attempts to localize.   Cranial Nerves: II: Does not blink to threat. Pupils are equal, round, and reactive to light.   III,IV, VI: Initially with right gaze deviation, but this subsided V: VII: corneals intact.  X: cough intact Motor: She has a right hemiparesis initially that improves over the course of my exam.  Sensory: She responds to nox stim x 4.  Cerebellar: Does not perform.   I have reviewed labs in epic and the results pertinent to this consultation are: UDS + for cocaine   I have reviewed the images obtained: CT/CTA - negative  Impression: 53 year old female presenting with new onset seizures and encephalopathy.  Possibilities include seizure secondary to previous PCA infarct, posterior reversible encephalopathy syndrome (PRES), seizure secondary to cocaine intoxication, hypertensive emergency.  I feel that stroke is much less likely, and therefore I do not feel that we need to do permissive hypertension.  Recommendations: 1) no  need for permissive hypertension 2) Keppra 3 g x 1 followed by 1 g twice daily 3) stat EEG with overnight monitoring 4) MRI once able 5) neurology will continue to follow  This patient is critically ill and at significant risk of neurological worsening, death and care requires constant monitoring of vital signs, hemodynamics,respiratory and cardiac monitoring, neurological assessment, discussion with family, other specialists and medical decision making of high complexity. I spent 45 minutes of neurocritical care time  in the care  of  this patient. This was time spent independent of any time provided by nurse practitioner or PA.  Roland Rack, MD [facility] [phone]  If 7pm- 7am, please page neurology on call as listed in [place]. 03/29/2019  7:33 PM

## 2019-03-29 NOTE — Code Documentation (Signed)
Patient arrived via EMS after family found her on the ground, unresponsive and drooling about 5:15pm.  She had seizure activity with EMS, Ativan given.  Unclear LKW, she was seen at a follow up visit with pulmonary yesterday about noon where she was at her baseline.  Code Stroke called at [facility] Patient remains unresponsive but moves left side spontaneously, but no movement of right noted. Stat labs and head CT done.  Patient now moving all extremities but remains unresponsive to staff.  CTA head neck done.  Code Stroke canceled at [place].  Returned to ED.  Decision was made to intubate patient.  Hand off with ED RN

## 2019-03-29 NOTE — Assessment & Plan Note (Addendum)
-  Patient continues to smoke - Strongly encouraged smoking cessation altogether - Continue Symbicort 160 twice daily; prn albuterol hfa q 6 hours - Continue Prednisone 57m daily  - Needs repeat PFTs  - FU with new pulmonary provider in 4 weeks (former [facility] patient)

## 2019-03-29 NOTE — Progress Notes (Signed)
vLTM started Neurology notified RN instructed of how to use event button

## 2019-03-29 NOTE — Procedures (Signed)
Patient Name: GERALD HONEA  MRN: [id number]  Epilepsy Attending: Lora Havens  Referring Physician/Provider: Dr Roland Rack Date: 03/29/2019 Duration: 21.34 mins  Patient history: 53yo F with seizure and ams. EEG to evaluate for status.  Level of alertness: lethargic  AEDs during EEG study: Keppra, propofol  Technical aspects: This EEG study was done with scalp electrodes positioned according to the 10-20 International system of electrode placement. Electrical activity was acquired at a sampling rate of 500Hz and reviewed with a high frequency filter of 70Hz and a low frequency filter of 1Hz. EEG data were recorded continuously and digitally stored.   DESCRIPTION: EEG showed continuous generalized high amplitude 2-3Hz rhythmic delta slowing, maximal left hemisphere. EEG was reactive to tactile stimulation.  Hyperventilation and photic stimulation were not performed.  ABNORMALITY: - Continuous rhythmic slow, generalized, lateralized left  IMPRESSION: This study is suggestive of non specific cortical dysfunction in left hemisphere and severe diffuse encephalopathy, likely secondary to sedated state. No seizures or epileptiform discharges were seen throughout the recording.    Ciella Obi Barbra Sarks

## 2019-03-29 NOTE — ED Provider Notes (Signed)
[facility] EMERGENCY DEPARTMENT Provider Note   CSN: [id number] Arrival date & time: 03/29/19  1740  An emergency department physician performed an initial assessment on this suspected stroke patient at 1744.  History   Chief Complaint Chief Complaint  Patient presents with   unresponsive   Seizures    HPI Diane Gilbert is a 53 y.o. female.     HPI   53 year old female with history of congestive heart failure, hypertension, interstitial lung disease on 4 L per nasal cannula, PCA stroke, OSA, diastolic CHF, schizophrenia, smoking, who presents with concern for altered mental status.  History is limited by patient's altered mental status and condition.  Per EMS, either a friend or cousin had returned home from work and found her altered.  On their arrival, she was unresponsive, and they began transport with her having a grand mal seizure with generalized convulsions which resolved with 2.5 mg of IV Versed.  She arrives to the emergency department with altered mental status and is unable to provide any history.  Called her friend who is listed as a contact who is currently in [place].  He he initially reported that he had last talked her on the phone approximately 2 hours ago, but after checking his phone records it as calling him again he reports it was at 10:39 AM.  Reports that when he talked to her, she seemed to be in a normal state of health, did not have any concerns.     Past Medical History:  Diagnosis Date   Acute on chronic respiratory failure with hypoxia ([place]) 06/07/2014   Acute respiratory failure with hypoxia (HCC) 03/23/2017   Alcohol abuse    Anxiety    Arthritis    CHF (congestive heart failure) ([place])    Community acquired pneumonia    Depression    Headache(784.0)    Hypertension    Insomnia    Interstitial lung disease ([place])    Oxygen dependent    Respiratory failure, acute and chronic ([place]) 05/09/2017    Schizophrenia ([place])    Shortness of breath dyspnea    Sleep apnea    Sleep apnea     Patient Active Problem List   Diagnosis Date Noted   Hx of sleep apnea 03/29/2019   Status epilepticus ([place]) 25/10/3974   Acute diastolic CHF (congestive heart failure) ([place]) 03/08/2019   Acute encephalopathy 03/07/2019   Ischemic stroke ([place]) 03/07/2019   Acute respiratory failure with hypoxia ([place]) 02/26/2019   Falls 02/06/2019   Depression 02/06/2019   Acute on chronic diastolic CHF (congestive heart failure) ([place]) 05/09/2017   Acute on chronic respiratory failure with hypoxia and hypercapnia (HCC) 05/09/2017   Facial swelling 03/23/2017   Chronic respiratory failure with hypoxia (HCC) 73/41/9379   Acute diastolic congestive heart failure ([place])    Schizophrenia ([place]) 03/24/2016   Leg swelling 01/16/2016   Interstitial lung disease ([place]) 07/16/2014   Respiratory bronchiolitis interstitial lung disease ([place]) 12/26/2013   Irregular menstrual cycle 01/23/2013   Smoking addiction 01/23/2013   Alcohol abuse 02/19/2012   Hyponatremia 02/19/2012   Abdominal pain 02/19/2012   Hypokalemia 02/19/2012    Past Surgical History:  Procedure Laterality Date   BACK SURGERY     LUNG BIOPSY Left 07/16/2014   Procedure: LUNG BIOPSY;  Surgeon: Grace Isaac, MD;  Location: [facility];  Service: Thoracic;  Laterality: Left;   MULTIPLE TOOTH EXTRACTIONS     RIGHT/LEFT HEART CATH AND CORONARY ANGIOGRAPHY N/A 01/04/2018   Procedure:  RIGHT/LEFT HEART CATH AND CORONARY ANGIOGRAPHY;  Surgeon: Adrian Prows, MD;  Location: [facility];  Service: Cardiovascular;  Laterality: N/A;   VIDEO ASSISTED THORACOSCOPY Left 07/16/2014   Procedure: VIDEO ASSISTED THORACOSCOPY;  Surgeon: Grace Isaac, MD;  Location: [facility];  Service: Thoracic;  Laterality: Left;   VIDEO BRONCHOSCOPY Bilateral 04/11/2014   Procedure: VIDEO BRONCHOSCOPY WITH FLUORO;  Surgeon: Kathee Delton, MD;  Location: [facility]  ENDOSCOPY;  Service: Cardiopulmonary;  Laterality: Bilateral;   VIDEO BRONCHOSCOPY N/A 07/16/2014   Procedure: VIDEO BRONCHOSCOPY;  Surgeon: Grace Isaac, MD;  Location: [facility] OR;  Service: Thoracic;  Laterality: N/A;   WEDGE RESECTION Left 07/16/2014   Procedure: WEDGE RESECTION;  Surgeon: Grace Isaac, MD;  Location: [facility] OR;  Service: Thoracic;  Laterality: Left;  left upper lobe lung resection     OB History    Gravida  0   Para  0   Term  0   Preterm  0   AB  0   Living  0     SAB  0   TAB  0   Ectopic  0   Multiple  0   Live Births               Home Medications    Prior to Admission medications   Medication Sig Start Date End Date Taking? Authorizing Provider  albuterol (PROVENTIL HFA;VENTOLIN HFA) 108 (90 BASE) MCG/ACT inhaler Inhale 2 puffs into the lungs every 6 (six) hours as needed for wheezing or shortness of breath. 05/09/15   Juanito Doom, MD  albuterol (PROVENTIL) (2.5 MG/3ML) 0.083% nebulizer solution Take 3 mLs (2.5 mg total) by nebulization every 6 (six) hours as needed for wheezing or shortness of breath. 02/06/19   Martyn Ehrich, NP  ARIPiprazole (ABILIFY) 5 MG tablet Take 5 mg by mouth daily. 02/23/19   [provider]  aspirin EC 81 MG EC tablet Take 1 tablet (81 mg total) by mouth daily. 03/12/19   Lavina Hamman, MD  budesonide-formoterol [facility]) 160-4.5 MCG/ACT inhaler INHALE TWO puffs into THE lungs TWICE DAILY Patient not taking: Reported on 03/28/2019 02/06/19   Martyn Ehrich, NP  buPROPion [facility] SR) 150 MG 12 hr tablet Take 150 mg by mouth daily. 02/14/19   [provider]  diphenhydrAMINE (BENADRYL) 25 mg capsule Take 1 capsule (25 mg total) by mouth every 6 (six) hours as needed for itching. 03/01/19   Debbe Odea, MD  famotidine (PEPCID) 20 MG tablet Take 1 tablet (20 mg total) by mouth at bedtime. 03/01/19   Debbe Odea, MD  furosemide (LASIX) 40 MG tablet Take 1 tablet (40 mg total) by  mouth 2 (two) times daily. 03/12/19   Lavina Hamman, MD  gabapentin (NEURONTIN) 300 MG capsule Take 300 mg by mouth 3 (three) times daily.    [provider]  losartan (COZAAR) 50 MG tablet Take 1 tablet (50 mg total) by mouth daily. 03/02/19   Debbe Odea, MD  metoprolol succinate (TOPROL-XL) 25 MG 24 hr tablet Take 1 tablet (25 mg total) by mouth daily. Patient not taking: Reported on 03/28/2019 03/12/19   Lavina Hamman, MD  Multiple Vitamin (MULTIVITAMIN WITH MINERALS) TABS tablet Take 1 tablet by mouth daily. 03/02/19   Debbe Odea, MD  nicotine polacrilex (NICORETTE) 2 MG gum Take 2 mg by mouth as needed for smoking cessation.    [provider]  OXYGEN Inhale 4 L into the lungs continuous.  [provider]  polyethylene glycol (MIRALAX / GLYCOLAX) 17 g packet Take 17 g by mouth daily. 03/12/19   Lavina Hamman, MD  predniSONE (DELTASONE) 10 MG tablet Take 1 tablet (10 mg total) by mouth daily with breakfast. Patient taking differently: Take 10 mg by mouth daily with breakfast. Currently taking 3m from taper at hospital discharge 9/29 03/13/19   PLavina Hamman MD  QUEtiapine (SEROQUEL) 100 MG tablet Take 100 mg by mouth every morning. 01/31/19   [provider]  QUEtiapine (SEROQUEL) 200 MG tablet Take 200 mg by mouth at bedtime. 02/11/19   [provider]  rosuvastatin (CRESTOR) 10 MG tablet Take 1 tablet (10 mg total) by mouth daily. 01/17/18   PBlanchie Dessert MD  traZODone (DESYREL) 100 MG tablet Take 100 mg by mouth at bedtime.    [provider]    Family History Family History  Problem Relation Age of Onset   Breast cancer Mother    Obstructive Sleep Apnea Mother    COPD Maternal Aunt     Social History Social History   Tobacco Use   Smoking status: Current Every Day Smoker    Packs/day: 1.50    Years: 30.00    Pack years: 45.00    Types: Cigarettes   Smokeless tobacco: Never Used   Tobacco comment:  smoking 3 cigarettes daily "every now and then" 11/04/16  Substance Use Topics   Alcohol use: Yes    Alcohol/week: 0.0 standard drinks    Frequency: Never    Comment: occasional    Drug use: Yes    Types: Cocaine     Allergies   Unasyn [ampicillin-sulbactam sodium], Contrast media [iodinated diagnostic agents], and Hydralazine   Review of Systems Review of Systems  Unable to perform ROS: Mental status change     Physical Exam Updated Vital Signs BP 108/68    Pulse 72    Temp 98.8 F (37.1 C) (Oral)    Resp 20    LMP 04/06/2017    SpO2 96%   Physical Exam Vitals signs and nursing note reviewed.  Constitutional:      General: She is not in acute distress.    Appearance: She is well-developed. She is ill-appearing. She is not diaphoretic.  HENT:     Head: Normocephalic and atraumatic.  Eyes:     Conjunctiva/sclera: Conjunctivae normal.  Neck:     Musculoskeletal: Normal range of motion.  Cardiovascular:     Rate and Rhythm: Normal rate and regular rhythm.     Heart sounds: Normal heart sounds. No murmur. No friction rub. No gallop.   Pulmonary:     Effort: Pulmonary effort is normal. No respiratory distress.     Breath sounds: Normal breath sounds. No wheezing or rales.  Abdominal:     General: There is no distension.     Palpations: Abdomen is soft.     Tenderness: There is no abdominal tenderness. There is no guarding.  Musculoskeletal:        General: No tenderness.  Skin:    General: Skin is warm and dry.     Findings: No erythema or rash.  Neurological:     Mental Status: She is lethargic.     GCS: GCS eye subscore is 2. GCS verbal subscore is 1. GCS motor subscore is 5.     Comments: Initially localizing using left arm only, not using right arm Initial right gaze preference  (Reeval noted to be using both sides, no longer  gaze preference)      ED Treatments / Results  Labs (all labs ordered are listed, but only abnormal results are displayed) Labs  Reviewed  CBC - Abnormal; Notable for the following components:      Result Value   RBC 7.50 (*)    Hemoglobin 18.0 (*)    HCT 63.9 (*)    MCH 24.0 (*)    MCHC 28.2 (*)    RDW 20.1 (*)    All other components within normal limits  DIFFERENTIAL - Abnormal; Notable for the following components:   Lymphs Abs 5.0 (*)    All other components within normal limits  COMPREHENSIVE METABOLIC PANEL - Abnormal; Notable for the following components:   Chloride 94 (*)    Glucose, Bld 160 (*)    Total Protein 8.6 (*)    Anion gap 18 (*)    All other components within normal limits  RAPID URINE DRUG SCREEN, HOSP PERFORMED - Abnormal; Notable for the following components:   Cocaine POSITIVE (*)    Benzodiazepines POSITIVE (*)    All other components within normal limits  URINALYSIS, ROUTINE W REFLEX MICROSCOPIC - Abnormal; Notable for the following components:   Color, Urine STRAW (*)    Protein, ur 100 (*)    Bacteria, UA RARE (*)    All other components within normal limits  LACTIC ACID, PLASMA - Abnormal; Notable for the following components:   Lactic Acid, Venous 7.3 (*)    All other components within normal limits  LACTIC ACID, PLASMA - Abnormal; Notable for the following components:   Lactic Acid, Venous 2.7 (*)    All other components within normal limits  CBG MONITORING, ED - Abnormal; Notable for the following components:   Glucose-Capillary 158 (*)    All other components within normal limits  POCT I-STAT 7, (LYTES, BLD GAS, ICA,H+H) - Abnormal; Notable for the following components:   pCO2 arterial 65.9 (*)    pO2, Arterial 69.0 (*)    Bicarbonate 37.4 (*)    TCO2 39 (*)    Acid-Base Excess 8.0 (*)    Potassium 3.2 (*)    HCT 61.0 (*)    Hemoglobin 20.7 (*)    All other components within normal limits  SARS CORONAVIRUS 2 (HOSPITAL ORDER, [facility])  ETHANOL  MAGNESIUM  TSH  T4, FREE  PHOSPHORUS  PROTIME-INR  APTT  BLOOD GAS, ARTERIAL    ACETAMINOPHEN LEVEL  SALICYLATE LEVEL  CBC  BASIC METABOLIC PANEL  MAGNESIUM  PHOSPHORUS  BLOOD GAS, ARTERIAL  HEMOGLOBIN A1C  TRIGLYCERIDES  I-STAT CHEM 8, ED  I-STAT BETA HCG BLOOD, ED ([facility], [facility], [facility] ONLY)  I-STAT ARTERIAL BLOOD GAS, ED  CBG MONITORING, ED  TROPONIN I (HIGH SENSITIVITY)  TROPONIN I (HIGH SENSITIVITY)    EKG EKG Interpretation  Date/Time:  Wednesday March 29 2019 17:49:27 EDT Ventricular Rate:  100 PR Interval:    QRS Duration: 83 QT Interval:  354 QTC Calculation: 457 R Axis:   -91 Text Interpretation:  Sinus tachycardia Ventricular premature complex Biatrial enlargement Left anterior fascicular block Anterior infarct, old No significant change since last tracing Confirmed by Gareth Morgan [phone]) on 03/30/2019 1:32:40 AM   Radiology Ct Angio Head W Or Wo Contrast  Result Date: 03/29/2019 CLINICAL DATA:  Code stroke. Focal neuro deficit, greater than 6 hours, stroke suspected. EXAM: CT ANGIOGRAPHY HEAD AND NECK TECHNIQUE: Multidetector CT imaging of the head and neck was performed using the standard protocol during  bolus administration of intravenous contrast. Multiplanar CT image reconstructions and MIPs were obtained to evaluate the vascular anatomy. Carotid stenosis measurements (when applicable) are obtained utilizing NASCET criteria, using the distal internal carotid diameter as the denominator. CONTRAST:  32m OMNIPAQUE IOHEXOL 300 MG/ML  SOLN COMPARISON:  Noncontrast head CT 03/29/2019, brain MRI 03/08/2019 FINDINGS: CTA NECK FINDINGS The examination is significantly motion degraded, limiting evaluation Aortic arch: Standard branching. Included portions of the aortic arch demonstrate no evidence of dissection or aneurysm. Right carotid system: The right common and cervical internal carotid arteries appear patent without significant stenosis (50% or greater). Soft and calcified plaque at the carotid bifurcation Left carotid system: The left common and  cervical internal carotid arteries appear patent without significant stenosis (50% or greater). Soft and calcified plaque at the carotid bifurcation Vertebral arteries: The bilateral vertebral arteries are patent within the neck. Significant motion degradation limits evaluation for stenoses within these vessels. Skeleton: Straightening of the expected cervical lordosis. Subacute appearing fracture of the anterior right first rib. Other neck: The glottis is closed. However, there is apparent symmetric swelling of the glottic/supraglottic soft tissues with questionable airway narrowing. Upper chest: Extensive patchy opacity within the bilateral lung apices likely reflecting aspiration/pneumonia. Review of the MIP images confirms the above findings CTA HEAD FINDINGS Significant motion degradation limits evaluation. Anterior circulation: The bilateral intracranial internal carotid arteries are patent. Soft and calcified plaque within the distal cavernous left ICA results in mild luminal narrowing. The right middle and anterior cerebral arteries are patent without evidence of high-grade proximal stenosis. The left middle and anterior cerebral arteries are patent. Question nonocclusive web-like stenosis within the distal M1 left middle cerebral artery (series 8, image 103). No intracranial aneurysm is identified Posterior circulation: The intracranial vertebral arteries are patent without convincing evidence of high-grade stenosis. Mild atherosclerotic irregularity of the basilar artery. The basilar artery is patent without high-grade stenosis. The bilateral posterior cerebral arteries are patent without evidence of high-grade proximal stenosis. Predominantly fetal origin of the right PCA. Please note that motion artifact significantly limits evaluation of the cerebellar arteries Venous sinuses: Within limitations of contrast timing, no convincing thrombus. Anatomic variants: None significant Review of the MIP images  confirms the above findings These results were called by telephone at the time of interpretation on 03/29/2019 at 6:35 pm to provider [facility], who verbally acknowledged these results. IMPRESSION: CTA head: 1. Evaluation significantly limited by motion degradation. 2. No intracranial large vessel occlusion identified. 3. Question nonocclusive web-like stenosis within the distal M1 left middle cerebral artery. 4. Atherosclerotic plaque results in mild narrowing of the distal cavernous left ICA. 5. Please note motion significant limits evaluation of the cerebellar arteries. CTA neck: 1. Evaluation significantly limited by motion artifact. 2. The common and cervical internal carotid arteries appear patent within the neck without significant stenosis (50% or greater). Soft and calcified plaque at the carotid bifurcations bilaterally. 3. The vertebral arteries appear patent throughout the neck. Significant motion degradation limits evaluation for stenoses within these vessels. 4. Apparent symmetric swelling of the glottic/supraglottic soft tissues with possible airway narrowing. Clinical correlation recommended. 5. Extensive patchy opacity within the imaged lung apices likely reflecting sequela of aspiration/pneumonia. 6. Subacute appearing fracture of the anterior right first rib. Electronically Signed   By: KKellie Simmering  On: 03/29/2019 19:13   Ct Angio Neck W Or Wo Contrast  Result Date: 03/29/2019 CLINICAL DATA:  Code stroke. Focal neuro deficit, greater than 6 hours, stroke suspected. EXAM: CT  ANGIOGRAPHY HEAD AND NECK TECHNIQUE: Multidetector CT imaging of the head and neck was performed using the standard protocol during bolus administration of intravenous contrast. Multiplanar CT image reconstructions and MIPs were obtained to evaluate the vascular anatomy. Carotid stenosis measurements (when applicable) are obtained utilizing NASCET criteria, using the distal internal carotid diameter as the  denominator. CONTRAST:  64m OMNIPAQUE IOHEXOL 300 MG/ML  SOLN COMPARISON:  Noncontrast head CT 03/29/2019, brain MRI 03/08/2019 FINDINGS: CTA NECK FINDINGS The examination is significantly motion degraded, limiting evaluation Aortic arch: Standard branching. Included portions of the aortic arch demonstrate no evidence of dissection or aneurysm. Right carotid system: The right common and cervical internal carotid arteries appear patent without significant stenosis (50% or greater). Soft and calcified plaque at the carotid bifurcation Left carotid system: The left common and cervical internal carotid arteries appear patent without significant stenosis (50% or greater). Soft and calcified plaque at the carotid bifurcation Vertebral arteries: The bilateral vertebral arteries are patent within the neck. Significant motion degradation limits evaluation for stenoses within these vessels. Skeleton: Straightening of the expected cervical lordosis. Subacute appearing fracture of the anterior right first rib. Other neck: The glottis is closed. However, there is apparent symmetric swelling of the glottic/supraglottic soft tissues with questionable airway narrowing. Upper chest: Extensive patchy opacity within the bilateral lung apices likely reflecting aspiration/pneumonia. Review of the MIP images confirms the above findings CTA HEAD FINDINGS Significant motion degradation limits evaluation. Anterior circulation: The bilateral intracranial internal carotid arteries are patent. Soft and calcified plaque within the distal cavernous left ICA results in mild luminal narrowing. The right middle and anterior cerebral arteries are patent without evidence of high-grade proximal stenosis. The left middle and anterior cerebral arteries are patent. Question nonocclusive web-like stenosis within the distal M1 left middle cerebral artery (series 8, image 103). No intracranial aneurysm is identified Posterior circulation: The intracranial  vertebral arteries are patent without convincing evidence of high-grade stenosis. Mild atherosclerotic irregularity of the basilar artery. The basilar artery is patent without high-grade stenosis. The bilateral posterior cerebral arteries are patent without evidence of high-grade proximal stenosis. Predominantly fetal origin of the right PCA. Please note that motion artifact significantly limits evaluation of the cerebellar arteries Venous sinuses: Within limitations of contrast timing, no convincing thrombus. Anatomic variants: None significant Review of the MIP images confirms the above findings These results were called by telephone at the time of interpretation on 03/29/2019 at 6:35 pm to provider [facility], who verbally acknowledged these results. IMPRESSION: CTA head: 1. Evaluation significantly limited by motion degradation. 2. No intracranial large vessel occlusion identified. 3. Question nonocclusive web-like stenosis within the distal M1 left middle cerebral artery. 4. Atherosclerotic plaque results in mild narrowing of the distal cavernous left ICA. 5. Please note motion significant limits evaluation of the cerebellar arteries. CTA neck: 1. Evaluation significantly limited by motion artifact. 2. The common and cervical internal carotid arteries appear patent within the neck without significant stenosis (50% or greater). Soft and calcified plaque at the carotid bifurcations bilaterally. 3. The vertebral arteries appear patent throughout the neck. Significant motion degradation limits evaluation for stenoses within these vessels. 4. Apparent symmetric swelling of the glottic/supraglottic soft tissues with possible airway narrowing. Clinical correlation recommended. 5. Extensive patchy opacity within the imaged lung apices likely reflecting sequela of aspiration/pneumonia. 6. Subacute appearing fracture of the anterior right first rib. Electronically Signed   By: KKellie Simmering  On: 03/29/2019 19:13    Dg Chest Portable 1 View  Result Date: 03/29/2019 CLINICAL DATA:  Post intubation EXAM: PORTABLE CHEST 1 VIEW COMPARISON:  Radiograph 03/07/2019, CT February 22, 2019 FINDINGS: Interval placement of an endotracheal tube position low within the trachea approximately 1.5 cm from the carina should be retracted at least 2 cm to the mid trachea. A transesophageal tube tip and side port distal to the GE junction curling in the left upper quadrant. There is obscuration of the right hemidiaphragm suggesting a pleural effusion likely with some adjacent atelectasis given the rightward mediastinal shift. Additional density silhouetting the right heart border may reflect partial collapse of the right lower lobe. Diffuse interstitial and airspace opacities are similar in extent to prior exam with a perihilar predominance. Cardiomegaly is unchanged accounting for differences in technique. IMPRESSION: 1. Interval placement of an endotracheal tube within the trachea, approximately 1.5 cm from the carina should be retracted at least 2 cm to the mid trachea. 2. A transesophageal tube tip and side port along the GE junction curling in the left upper quadrant. 3. Interstitial and airspace opacities suggestive of pulmonary edema with cardiomegaly. Underlying infection is not excluded. 4. Density along the right heart border with rightward mediastinal shift suggesting partial collapse of the right lower lobe. Electronically Signed   By: Lovena Le M.D.   On: 03/29/2019 20:02   Ct Head Code Stroke Wo Contrast  Result Date: 03/29/2019 CLINICAL DATA:  Code stroke. Altered mental status (AMS), unclear cause. Additional history provided: Patient found by family on the ground unresponsive, drooling profusely. Subsequent seizure. aa EXAM: CT HEAD WITHOUT CONTRAST TECHNIQUE: Contiguous axial images were obtained from the base of the skull through the vertex without intravenous contrast. COMPARISON:  Brain MRI 03/08/2011, head CT  03/07/2019 FINDINGS: Brain: No evidence of acute intracranial hemorrhage. No acute demarcated cortical infarction is identified. Redemonstrated chronic left PCA territory infarct. Known chronic lacunar infarcts within the bilateral basal ganglia and right cerebellum. Question subtle asymmetric hypodensity within the left cerebellum (series 2, image 9). No evidence of intracranial mass. No midline shift or extra-axial fluid collection. Ill-defined hypoattenuation of the cerebral white matter is nonspecific, but consistent with chronic small vessel ischemic disease Cerebral volume is normal for age. Vascular: No hyperdense vessel. Skull: No calvarial fracture Sinuses/Orbits: Visualized orbits demonstrate no acute abnormality. Minimal scattered paranasal sinus mucosal thickening. No significant mastoid effusion. ASPECTS [facility] Stroke Program Early CT Score) not provided given history. These results were called by telephone at the time of interpretation on 03/29/2019 at 6:35 pm to provider Dr. Leonel Ramsay, who verbally acknowledged these results. IMPRESSION: 1. No evidence of acute intracranial hemorrhage or acute demarcated cortical infarction. 2. Question subtle nonspecific asymmetric hypodensity within the left cerebellum. Consider brain MRI for further evaluation. 3. Redemonstrated chronic left PCA territory infarct. 4. Known chronic lacunar infarcts within the bilateral basal ganglia and right cerebellum. 5. Chronic small vessel ischemic disease. Electronically Signed   By: Kellie Simmering   On: 03/29/2019 18:41    Procedures .Critical Care Performed by: Gareth Morgan, MD Authorized by: Gareth Morgan, MD   Critical care provider statement:    Critical care time (minutes):  75   Critical care was necessary to treat or prevent imminent or life-threatening deterioration of the following conditions:  CNS failure or compromise   Critical care was time spent personally by me on the following activities:   Discussions with consultants, evaluation of patient's response to treatment, examination of patient, ordering and performing treatments and interventions, ordering and review of laboratory studies, ordering and  review of radiographic studies, pulse oximetry, re-evaluation of patient's condition, obtaining history from patient or surrogate and review of old charts Procedure Name: Intubation Date/Time: 03/30/2019 1:36 AM Performed by: Gareth Morgan, MD Pre-anesthesia Checklist: Patient identified, Patient being monitored, Emergency Drugs available, Timeout performed and Suction available Oxygen Delivery Method: Non-rebreather mask Preoxygenation: Pre-oxygenation with 100% oxygen Induction Type: Rapid sequence Ventilation: Mask ventilation without difficulty and Nasal airway inserted- appropriate to patient size Laryngoscope Size: Glidescope Grade View: Grade I Tube size: 7.5 mm Number of attempts: 1 Placement Confirmation: ETT inserted through vocal cords under direct vision,  CO2 detector and Breath sounds checked- equal and bilateral Future Recommendations: Recommend- induction with short-acting agent, and alternative techniques readily available      (including critical care time)  Medications Ordered in ED Medications  levETIRAcetam (KEPPRA) IVPB 1000 mg/100 mL premix (0 mg Intravenous Stopping Infusion hung by another clincian 03/29/19 2308)  heparin injection 5,000 Units (has no administration in time range)  famotidine (PEPCID) 40 MG/5ML suspension 20 mg (has no administration in time range)  0.9 %  sodium chloride infusion ( Intravenous New Bag/Given 03/30/19 0024)  ipratropium-albuterol (DUONEB) 0.5-2.5 (3) MG/3ML nebulizer solution 3 mL (has no administration in time range)  budesonide (PULMICORT) nebulizer solution 0.5 mg (0.5 mg Nebulization Not Given 03/30/19 0115)  insulin aspart (novoLOG) injection 0-15 Units (has no administration in time range)  fentaNYL (SUBLIMAZE)  injection 50 mcg (50 mcg Intravenous Given 03/30/19 0017)  fentaNYL (SUBLIMAZE) injection 50-200 mcg (has no administration in time range)  propofol (DIPRIVAN) 1000 MG/100ML infusion (33.568 mcg/kg/min  99.3 kg Intravenous Rate/Dose Change 03/30/19 0015)  iohexol (OMNIPAQUE) 300 MG/ML solution 75 mL (75 mLs Intravenous Contrast Given 03/29/19 1832)  naloxone (NARCAN) 0.4 MG/ML injection (  Given 03/29/19 1850)  levETIRAcetam (KEPPRA) IVPB 1000 mg/100 mL premix (0 mg Intravenous Stopped 03/29/19 1915)  propofol (DIPRIVAN) 1000 MG/100ML infusion (  Rate/Dose Change 03/29/19 2302)  metroNIDAZOLE (FLAGYL) IVPB 500 mg (0 mg Intravenous Stopped 03/29/19 2146)  fentaNYL (SUBLIMAZE) injection 50 mcg (50 mcg Intravenous Given 03/29/19 2231)  propofol (DIPRIVAN) 1000 MG/100ML infusion (  Started During Downtime 03/29/19 2316)     Initial Impression / Assessment and Plan / ED Course  I have reviewed the triage vital signs and the nursing notes.  Pertinent labs & imaging results that were available during my care of the patient were reviewed by me and considered in my medical decision making (see chart for details).       53 year old female with history of congestive heart failure, hypertension, interstitial lung disease on 4 L per nasal cannula, PCA stroke, OSA, diastolic CHF, schizophrenia, smoking, who presents with concern for altered mental status and had a generalized seizure in route.  She has no known history of seizures.    Initially on my exam, she was opening her eyes and localizing to pain, with a GCS of 8.  At the time, she was localizing only with the left arm, and I was unable to get her to withdraw from pain on the right side.  Given concern for right hemiparesis as well as a right gaze preference, called code stroke with Dr. Leonel Ramsay at bedside.  Patient was taken emergently to CT which showed no evidence of intracranial bleed or large vessel occlusion.  Suspect most likely symptoms or  secondary to seizure and status epilepticus.  She was altered on EMS arrival, noted to have a seizure with them.  Initially felt she was appropriately protecting her airway in  setting of postictal state and versed for emergent CT, however when she returned from CT she had not improved and appeared to be not opening her eyes as readily to pain, and given concern for worsening she was intubated for airway protection.  Of note CT does show supraglottic edema.  She has no signs of anaphylaxis, no rash, no vomiting, no signs of dyspnea.  She was intubated using a glidescope without complication. ETT pulled back after XR.  Labs show negative COVID19 testing. UA without infection. Preg negative.  AG 18 and lactic acid likely secondary to seizure. No fever, no sign of sepsis.  Given abx for possible aspiration given XR.  Suspect most likely seizure secondary to cocaine intoxication, although consider PRES, hypertensive emergency, CVA.   Dr. Leonel Ramsay of Neurology gave Keppra, continuous EEG monitoring in process, MRI once able.  Admitted to PCCM for further care.   Final Clinical Impressions(s) / ED Diagnoses   Final diagnoses:  Seizure [facility])  Encephalopathy    ED Discharge Orders    None       Gareth Morgan, MD 03/30/19 [phone]

## 2019-03-29 NOTE — Assessment & Plan Note (Signed)
-  In-lab sleep study re: sleep apnea

## 2019-03-29 NOTE — H&P (Signed)
NAME:  AVREE SZCZYGIEL, MRN:  [id number], DOB:  Jan 21, 1966, LOS: 0 ADMISSION DATE:  03/29/2019, CONSULTATION DATE:  9/30 REFERRING MD:  Dr Billy Fischer, CHIEF COMPLAINT:  Status epilepticus  Brief History:   53 year old female with multiple chronic medical issues was found down and unresponsive 9/30. Grand mal seizure en route to ED. Intubated for airway protection after not returning to baseline mental status.   History of present illness  Patient is encephalopathic and/or intubated. Therefore history has been obtained from chart review.  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 She is also a current everyday smoker. She was recently admitted twice to [facility]. Once for running out of oxygen and once for confusion (her O2 tubing was kinked and SpO2 50%). She was also found to have subacute/chronic PCA infarct. Mental status improved with supplemental O2 and she was recommended to discharge to SNF, which she refused, and was discharged to home 9/13.  Then 9/30 was found down at home in the early evening hours. Last known well 1030 am that morning. EMS was called and she was transported to ED, which was complicated by a convulsive seizure en route, which was improved with versed. Upon arrival to ER she had R hemiparesis and R gaze deviation. She was also profoundly hypertensive. Code stroke was activated. Neuroimaging was negative. After CT she remained unresponsive and was intubated for airway protection. She was loaded with Keppra and started on EEG in ED. PCCM asked to admit. Notably, her UDS was positive for cocaine.   Past Medical History   has a past medical history of Acute on chronic respiratory failure with hypoxia ([place]) (06/07/2014), Acute respiratory failure with hypoxia ([place]) (03/23/2017), Alcohol abuse, Anxiety, Arthritis, CHF (congestive heart failure) ([place]), Community  acquired pneumonia, Depression, Headache(784.0), Hypertension, Insomnia, Interstitial lung disease ([place]), Oxygen dependent, Respiratory failure, acute and chronic ([place]) (05/09/2017), Schizophrenia ([place]), Shortness of breath dyspnea, Sleep apnea, and Sleep apnea.  Significant Hospital Events   9/30 admit, intubated for status epilepticus  Consults:  Neurology  Procedures:  ETT 9/30 >  Significant Diagnostic Tests:  EEG 9/30 >  Micro Data:    Antimicrobials:  Cefepime 9/30 Flagyl 9/30   Interim history/subjective:    Objective   Blood pressure 100/64, pulse 74, temperature 98.8 F (37.1 C), temperature source Oral, resp. rate 20, last menstrual period 04/06/2017, SpO2 94 %.    Vent Mode: PRVC FiO2 (%):  [100 %] 100 % Set Rate:  [18 bmp-20 bmp] 20 bmp Vt Set:  [440 mL] 440 mL PEEP:  [5 cmH20] 5 cmH20  No intake or output data in the 24 hours ending 03/29/19 2259 There were no vitals filed for this visit.  Examination: General: Morbidly obese female on vent HENT: [place]/AT, PERRL, no JVD Lungs: Clear bilateral breath sounds Cardiovascular: RRR, no MRG Abdomen: Soft, non-tender, non-distended Extremities: No acute deformity Neuro: Unresponsive, sedated.   Resolved Hospital Problem list     Assessment & Plan:   Status epilepticus: no history of seizure. ddx includes recent stroke and cocaine intoxication  - Neurology following - EEG ongoing - MRI pending - Continue keppra 1 g BID  Acute on chronic hypoxemic respiratory failure Bronchiolitis interstitial lung disease COPD without acute exacerbation Possible aspiration noted on CT neck.  - Full vent support - VAP bundle - ABG as needed - Daily WUA/SBT - has been on  prednisone since 9/13 10 mg. Will discontinue. - Duoneb, budesonide scheduled  Hypertensive emergency: seems acute in the setting of cocaine. Bp was 152/100 in pulmonary clinic 9/29. - Keep SBP then less than 140.  - Currently maintaining without  PRNs/Drips - Holding home lasix, losartan, metoprolol for now  OSA: non-compliant with CPAP  Substance abuse: Cocaine positive on UDS - Cessation counseling - Check ASA and tylenol level  Schizophrenia - holding home abilify, neurontin, seroquel, trazodone  Best practice:  Diet: NPO Pain/Anxiety/Delirium protocol (if indicated): propofol for RASS -1 to -2 VAP protocol (if indicated): Yes DVT prophylaxis: SQ heparin GI prophylaxis:  H2  Glucose control: Na Mobility: BR Code Status: FULL Family Communication: family unable to talk to EDP due to party going on.  Disposition: ICU  Labs   CBC: Recent Labs  Lab 03/29/19 1804  WBC 10.4  NEUTROABS 4.8  HGB 18.0*  HCT 63.9*  MCV 85.2  PLT 491    Basic Metabolic Panel: Recent Labs  Lab 03/29/19 1804 03/29/19 2135  NA 138  --   K 3.8  --   CL 94*  --   CO2 26  --   GLUCOSE 160*  --   BUN 9  --   CREATININE 0.77  --   CALCIUM 9.7  --   MG  --  1.8  PHOS  --  4.2   GFR: Estimated Creatinine Clearance: 96.3 mL/min (by C-G formula based on SCr of 0.77 mg/dL). Recent Labs  Lab 03/29/19 1804 03/29/19 1808 03/29/19 2135  WBC 10.4  --   --   LATICACIDVEN  --  7.3* 2.7*    Liver Function Tests: Recent Labs  Lab 03/29/19 1804  AST 29  ALT 27  ALKPHOS 70  BILITOT 0.8  PROT 8.6*  ALBUMIN 4.4   No results for input(s): LIPASE, AMYLASE in the last 168 hours. No results for input(s): AMMONIA in the last 168 hours.  ABG    Component Value Date/Time   PHART 7.337 (L) 02/27/2019 1530   PCO2ART 93.4 (HH) 02/27/2019 1530   PO2ART 76.4 (L) 02/27/2019 1530   HCO3 48.4 (H) 03/07/2019 2055   TCO2 28 01/04/2018 1208   O2SAT 87.6 03/07/2019 2055     Coagulation Profile: No results for input(s): INR, PROTIME in the last 168 hours.  Cardiac Enzymes: No results for input(s): CKTOTAL, CKMB, CKMBINDEX, TROPONINI in the last 168 hours.  HbA1C: Hgb A1c MFr Bld  Date/Time Value Ref Range Status  07/04/2016 05:58  PM 6.2 (H) 4.8 - 5.6 % Final    Comment:    (NOTE)         Pre-diabetes: 5.7 - 6.4         Diabetes: >6.4         Glycemic control for adults with diabetes: <7.0     CBG: Recent Labs  Lab 03/29/19 1747  GLUCAP 158*    Review of Systems:   Unable as patient is intubated/encephalopathic  Past Medical History  She,  has a past medical history of Acute on chronic respiratory failure with hypoxia ([place]) (06/07/2014), Acute respiratory failure with hypoxia ([place]) (03/23/2017), Alcohol abuse, Anxiety, Arthritis, CHF (congestive heart failure) ([place]), Community acquired pneumonia, Depression, Headache(784.0), Hypertension, Insomnia, Interstitial lung disease ([place]), Oxygen dependent, Respiratory failure, acute and chronic ([place]) (05/09/2017), Schizophrenia ([place]), Shortness of breath dyspnea, Sleep apnea, and Sleep apnea.   Surgical History    Past Surgical History:  Procedure Laterality Date  . BACK SURGERY    .  LUNG BIOPSY Left 07/16/2014   Procedure: LUNG BIOPSY;  Surgeon: Grace Isaac, MD;  Location: [place];  Service: Thoracic;  Laterality: Left;  Marland Kitchen MULTIPLE TOOTH EXTRACTIONS    . RIGHT/LEFT HEART CATH AND CORONARY ANGIOGRAPHY N/A 01/04/2018   Procedure: RIGHT/LEFT HEART CATH AND CORONARY ANGIOGRAPHY;  Surgeon: Adrian Prows, MD;  Location: [place] [facility];  Service: Cardiovascular;  Laterality: N/A;  . VIDEO ASSISTED THORACOSCOPY Left 07/16/2014   Procedure: VIDEO ASSISTED THORACOSCOPY;  Surgeon: Grace Isaac, MD;  Location: [place];  Service: Thoracic;  Laterality: Left;  Marland Kitchen VIDEO BRONCHOSCOPY Bilateral 04/11/2014   Procedure: VIDEO BRONCHOSCOPY WITH FLUORO;  Surgeon: Kathee Delton, MD;  Location: [facility];  Service: Cardiopulmonary;  Laterality: Bilateral;  . VIDEO BRONCHOSCOPY N/A 07/16/2014   Procedure: VIDEO BRONCHOSCOPY;  Surgeon: Grace Isaac, MD;  Location: [facility];  Service: Thoracic;  Laterality: N/A;  . WEDGE RESECTION Left 07/16/2014   Procedure: WEDGE RESECTION;   Surgeon: Grace Isaac, MD;  Location: [place];  Service: Thoracic;  Laterality: Left;  left upper lobe lung resection     Social History   reports that she has been smoking cigarettes. She has a 45.00 pack-year smoking history. She has never used smokeless tobacco. She reports current alcohol use. She reports current drug use. Drug: Cocaine.   Family History   Her family history includes Breast cancer in her mother; COPD in her maternal aunt; Obstructive Sleep Apnea in her mother.   Allergies Allergies  Allergen Reactions  . Unasyn [Ampicillin-Sulbactam Sodium] Swelling and Rash    angioedema  . Contrast Media [Iodinated Diagnostic Agents] Nausea And Vomiting  . Hydralazine Swelling     Home Medications  Prior to Admission medications   Medication Sig Start Date End Date Taking? Authorizing Provider  albuterol (PROVENTIL HFA;VENTOLIN HFA) 108 (90 BASE) MCG/ACT inhaler Inhale 2 puffs into the lungs every 6 (six) hours as needed for wheezing or shortness of breath. 05/09/15   Juanito Doom, MD  albuterol (PROVENTIL) (2.5 MG/3ML) 0.083% nebulizer solution Take 3 mLs (2.5 mg total) by nebulization every 6 (six) hours as needed for wheezing or shortness of breath. 02/06/19   Martyn Ehrich, NP  ARIPiprazole (ABILIFY) 5 MG tablet Take 5 mg by mouth daily. 02/23/19   [provider]  aspirin EC 81 MG EC tablet Take 1 tablet (81 mg total) by mouth daily. 03/12/19   Lavina Hamman, MD  budesonide-formoterol [facility]) 160-4.5 MCG/ACT inhaler INHALE TWO puffs into THE lungs TWICE DAILY Patient not taking: Reported on 03/28/2019 02/06/19   Martyn Ehrich, NP  buPROPion [facility] SR) 150 MG 12 hr tablet Take 150 mg by mouth daily. 02/14/19   [provider]  diphenhydrAMINE (BENADRYL) 25 mg capsule Take 1 capsule (25 mg total) by mouth every 6 (six) hours as needed for itching. 03/01/19   Debbe Odea, MD  famotidine (PEPCID) 20 MG tablet Take 1 tablet (20 mg total) by  mouth at bedtime. 03/01/19   Debbe Odea, MD  furosemide (LASIX) 40 MG tablet Take 1 tablet (40 mg total) by mouth 2 (two) times daily. 03/12/19   Lavina Hamman, MD  gabapentin (NEURONTIN) 300 MG capsule Take 300 mg by mouth 3 (three) times daily.    [provider]  losartan (COZAAR) 50 MG tablet Take 1 tablet (50 mg total) by mouth daily. 03/02/19   Debbe Odea, MD  metoprolol succinate (TOPROL-XL) 25 MG 24 hr tablet Take 1 tablet (25 mg total)  by mouth daily. Patient not taking: Reported on 03/28/2019 03/12/19   Lavina Hamman, MD  Multiple Vitamin (MULTIVITAMIN WITH MINERALS) TABS tablet Take 1 tablet by mouth daily. 03/02/19   Debbe Odea, MD  nicotine polacrilex (NICORETTE) 2 MG gum Take 2 mg by mouth as needed for smoking cessation.    [provider]  OXYGEN Inhale 4 L into the lungs continuous.     [provider]  polyethylene glycol (MIRALAX / GLYCOLAX) 17 g packet Take 17 g by mouth daily. 03/12/19   Lavina Hamman, MD  predniSONE (DELTASONE) 10 MG tablet Take 1 tablet (10 mg total) by mouth daily with breakfast. Patient taking differently: Take 10 mg by mouth daily with breakfast. Currently taking 48m from taper at hospital discharge 9/29 03/13/19   PLavina Hamman MD  QUEtiapine (SEROQUEL) 100 MG tablet Take 100 mg by mouth every morning. 01/31/19   [provider]  QUEtiapine (SEROQUEL) 200 MG tablet Take 200 mg by mouth at bedtime. 02/11/19   [provider]  rosuvastatin (CRESTOR) 10 MG tablet Take 1 tablet (10 mg total) by mouth daily. 01/17/18   PBlanchie Dessert MD  traZODone (DESYREL) 100 MG tablet Take 100 mg by mouth at bedtime.    [provider]     Critical care time: 45 mins   CRITICAL CARE Performed by: PCorey Harold  Critical care time was exclusive of separately billable procedures and treating other patients.  Critical care was necessary to treat or prevent imminent or life-threatening deterioration due to  status epilepticus, acute on chronic hypoxemic respiratory failure requiring mechanical ventilation.   Critical care was time spent personally by me on the following activities: development of treatment plan with patient and/or surrogate as well as nursing, discussions with consultants, evaluation of patient's response to treatment, examination of patient, obtaining history from patient or surrogate, ordering and performing treatments and interventions, ordering and review of laboratory studies, ordering and review of radiographic studies, pulse oximetry and re-evaluation of patient's condition.   PGeorgann Housekeeper AGACNP-BC LMilesPager [phone] 03/29/2019 11:39 PM

## 2019-03-29 NOTE — ED Triage Notes (Signed)
Pt found by family on the ground unresponsive, drooling profusely. After EMS got her in the truck pt started seizing. EMS gave 2.5 mg versed en route. Pt unresponsive on arrival. Hx HTN, COPD, CHF, no hx of seizures. Pupils bloodshot and pinpoint. Sats 90 on room air. NRB 10 L/m sat 96%. No known LKW

## 2019-03-30 ENCOUNTER — Encounter (HOSPITAL_COMMUNITY): Payer: Self-pay

## 2019-03-30 ENCOUNTER — Other Ambulatory Visit: Payer: Self-pay

## 2019-03-30 DIAGNOSIS — G9340 Encephalopathy, unspecified: Secondary | ICD-10-CM

## 2019-03-30 DIAGNOSIS — G40901 Epilepsy, unspecified, not intractable, with status epilepticus: Secondary | ICD-10-CM

## 2019-03-30 LAB — POCT I-STAT 7, (LYTES, BLD GAS, ICA,H+H)
Acid-Base Excess: 9 mmol/L — ABNORMAL HIGH (ref 0.0–2.0)
Bicarbonate: 37.1 mmol/L — ABNORMAL HIGH (ref 20.0–28.0)
Calcium, Ion: 1.24 mmol/L (ref 1.15–1.40)
HCT: 60 % — ABNORMAL HIGH (ref 36.0–46.0)
Hemoglobin: 20.4 g/dL — ABNORMAL HIGH (ref 12.0–15.0)
O2 Saturation: 93 %
Patient temperature: 98.6
Potassium: 2.8 mmol/L — ABNORMAL LOW (ref 3.5–5.1)
Sodium: 140 mmol/L (ref 135–145)
TCO2: 39 mmol/L — ABNORMAL HIGH (ref 22–32)
pCO2 arterial: 57 mmHg — ABNORMAL HIGH (ref 32.0–48.0)
pH, Arterial: 7.422 (ref 7.350–7.450)
pO2, Arterial: 68 mmHg — ABNORMAL LOW (ref 83.0–108.0)

## 2019-03-30 LAB — BASIC METABOLIC PANEL
Anion gap: 17 — ABNORMAL HIGH (ref 5–15)
Anion gap: 15 (ref 5–15)
Anion gap: 10 (ref 5–15)
BUN: 12 mg/dL (ref 6–20)
BUN: 15 mg/dL (ref 6–20)
BUN: 10 mg/dL (ref 6–20)
CO2: 29 mmol/L (ref 22–32)
CO2: 31 mmol/L (ref 22–32)
CO2: 33 mmol/L — ABNORMAL HIGH (ref 22–32)
Calcium: 9.6 mg/dL (ref 8.9–10.3)
Calcium: 9.6 mg/dL (ref 8.9–10.3)
Calcium: 9.2 mg/dL (ref 8.9–10.3)
Chloride: 95 mmol/L — ABNORMAL LOW (ref 98–111)
Chloride: 94 mmol/L — ABNORMAL LOW (ref 98–111)
Chloride: 99 mmol/L (ref 98–111)
Creatinine, Ser: 0.62 mg/dL (ref 0.44–1.00)
Creatinine, Ser: 0.79 mg/dL (ref 0.44–1.00)
Creatinine, Ser: 0.74 mg/dL (ref 0.44–1.00)
GFR calc Af Amer: >60 mL/min (ref >60)
GFR calc Af Amer: >60 mL/min (ref >60)
GFR calc Af Amer: >60 mL/min (ref >60)
GFR calc non Af Amer: >60 mL/min (ref >60)
GFR calc non Af Amer: >60 mL/min (ref >60)
GFR calc non Af Amer: >60 mL/min (ref >60)
Glucose, Bld: 91 mg/dL (ref 70–99)
Glucose, Bld: 107 mg/dL — ABNORMAL HIGH (ref 70–99)
Glucose, Bld: 85 mg/dL (ref 70–99)
Potassium: 4.3 mmol/L (ref 3.5–5.1)
Potassium: 4.1 mmol/L (ref 3.5–5.1)
Potassium: 3.3 mmol/L — ABNORMAL LOW (ref 3.5–5.1)
Sodium: 141 mmol/L (ref 135–145)
Sodium: 140 mmol/L (ref 135–145)
Sodium: 142 mmol/L (ref 135–145)

## 2019-03-30 LAB — GLUCOSE, CAPILLARY
Glucose-Capillary: 104 mg/dL — ABNORMAL HIGH (ref 70–99)
Glucose-Capillary: 113 mg/dL — ABNORMAL HIGH (ref 70–99)
Glucose-Capillary: 131 mg/dL — ABNORMAL HIGH (ref 70–99)
Glucose-Capillary: 108 mg/dL — ABNORMAL HIGH (ref 70–99)
Glucose-Capillary: 95 mg/dL (ref 70–99)
Glucose-Capillary: 94 mg/dL (ref 70–99)

## 2019-03-30 LAB — MAGNESIUM
Magnesium: 1.8 mg/dL (ref 1.7–2.4)
Magnesium: 1.9 mg/dL (ref 1.7–2.4)

## 2019-03-30 LAB — CBC
HCT: 61.8 % — ABNORMAL HIGH (ref 36.0–46.0)
Hemoglobin: 17.7 g/dL — ABNORMAL HIGH (ref 12.0–15.0)
MCH: 23.6 pg — ABNORMAL LOW (ref 26.0–34.0)
MCHC: 28.6 g/dL — ABNORMAL LOW (ref 30.0–36.0)
MCV: 82.3 fL (ref 80.0–100.0)
Platelets: 171 10*3/uL (ref 150–400)
RBC: 7.51 MIL/uL — ABNORMAL HIGH (ref 3.87–5.11)
RDW: 20.1 % — ABNORMAL HIGH (ref 11.5–15.5)
WBC: 11.9 10*3/uL — ABNORMAL HIGH (ref 4.0–10.5)
nRBC: 0 % (ref 0.0–0.2)

## 2019-03-30 LAB — TRIGLYCERIDES: Triglycerides: 184 mg/dL — ABNORMAL HIGH (ref <150)

## 2019-03-30 LAB — SALICYLATE LEVEL: Salicylate Lvl: <7 mg/dL (ref 2.8–30.0)

## 2019-03-30 LAB — HEMOGLOBIN A1C
Hgb A1c MFr Bld: 6 % — ABNORMAL HIGH (ref 4.8–5.6)
Mean Plasma Glucose: 125.5 mg/dL

## 2019-03-30 LAB — CBG MONITORING, ED: Glucose-Capillary: 91 mg/dL (ref 70–99)

## 2019-03-30 LAB — PHOSPHORUS
Phosphorus: 3.4 mg/dL (ref 2.5–4.6)
Phosphorus: 3.8 mg/dL (ref 2.5–4.6)

## 2019-03-30 LAB — ACETAMINOPHEN LEVEL: Acetaminophen (Tylenol), Serum: <10 ug/mL — ABNORMAL LOW (ref 10–30)

## 2019-03-30 LAB — TROPONIN I (HIGH SENSITIVITY): Troponin I (High Sensitivity): 13 ng/L (ref <18)

## 2019-03-30 LAB — MRSA PCR SCREENING: MRSA by PCR: NEGATIVE

## 2019-03-30 MED ORDER — POTASSIUM CHLORIDE 10 MEQ/100ML IV SOLN
10.0000 meq | INTRAVENOUS | Status: DC
  Administered 2019-03-30: 04:00:00 10 meq via INTRAVENOUS
  Filled 2019-03-30: qty 100

## 2019-03-30 MED ORDER — LORAZEPAM 2 MG/ML IJ SOLN: 2.0000 mg | INTRAMUSCULAR | Status: DC | PRN

## 2019-03-30 MED ORDER — ONDANSETRON HCL 4 MG/2ML IJ SOLN
4.0000 mg | Freq: Four times a day (QID) | INTRAMUSCULAR | Status: DC | PRN
  Filled 2019-03-30: qty 2

## 2019-03-30 MED ORDER — ORAL CARE MOUTH RINSE
15.0000 mL | OROMUCOSAL | Status: DC
  Administered 2019-03-30 (×7): 15 mL via OROMUCOSAL

## 2019-03-30 MED ORDER — CHLORHEXIDINE GLUCONATE CLOTH 2 % EX PADS
6.0000 | MEDICATED_PAD | Freq: Every day | CUTANEOUS | Status: DC
  Administered 2019-03-30 – 2019-04-01 (×3): 6 via TOPICAL

## 2019-03-30 MED ORDER — MAGNESIUM SULFATE 2 GM/50ML IV SOLN
2.0000 g | Freq: Once | INTRAVENOUS | Status: AC
  Administered 2019-03-30: 2 g via INTRAVENOUS
  Filled 2019-03-30: qty 50

## 2019-03-30 MED ORDER — INFLUENZA VAC SPLIT QUAD 0.5 ML IM SUSY
0.5000 mL | PREFILLED_SYRINGE | INTRAMUSCULAR | Status: DC
  Filled 2019-03-30 (×2): qty 0.5

## 2019-03-30 MED ORDER — CHLORHEXIDINE GLUCONATE 0.12% ORAL RINSE (MEDLINE KIT)
15.0000 mL | Freq: Two times a day (BID) | OROMUCOSAL | Status: DC
  Administered 2019-03-30 – 2019-04-01 (×6): 15 mL via OROMUCOSAL

## 2019-03-30 MED ORDER — ONDANSETRON HCL 4 MG/5ML PO SOLN
4.0000 mg | Freq: Four times a day (QID) | ORAL | Status: DC | PRN
  Filled 2019-03-30: qty 5

## 2019-03-30 MED ORDER — ORAL CARE MOUTH RINSE
15.0000 mL | Freq: Two times a day (BID) | OROMUCOSAL | Status: DC
  Administered 2019-03-30 – 2019-04-01 (×4): 15 mL via OROMUCOSAL

## 2019-03-30 NOTE — Progress Notes (Signed)
[facility] Progress Note Patient Name: [name] DOB: 1966-01-10 MRN: [id number]   Date of Service  03/30/2019  HPI/Events of Note  Patient with episodes of tachyarrhythmias, not sustained  eICU Interventions  BMP, Mg and phos ordered     Intervention Category Major Interventions: Arrhythmia - evaluation and management  Judd Lien 03/30/2019, 10:26 PM

## 2019-03-30 NOTE — Progress Notes (Signed)
Sputum sample sent to lab per MD order

## 2019-03-30 NOTE — Progress Notes (Signed)
LTM maint complete - no skin breakdown under:  Fp1, fp2, f7

## 2019-03-30 NOTE — Progress Notes (Signed)
Reviewed, agree 

## 2019-03-30 NOTE — Progress Notes (Signed)
PT transported to 35mwith no complications.

## 2019-03-30 NOTE — Progress Notes (Signed)
Reason for consult: Seizure  Subjective: No acute events overnight.  Per CCM team, plan to extubate this morning.  ROS: Unable to obtain due to intubation  Examination  Vital signs in last 24 hours: Temp:  [98 F (36.7 C)-98.8 F (37.1 C)] 98.3 F (36.8 C) (10/01 1110) Pulse Rate:  [38-102] 48 (10/01 1012) Resp:  [13-35] 35 (10/01 1012) BP: (71-182)/(29-123) 102/56 (10/01 0802) SpO2:  [91 %-100 %] 91 % (10/01 1125) FiO2 (%):  [50 %-100 %] 50 % (10/01 0802) Weight:  [95.4 kg] 95.4 kg (10/01 0215)  General: lying in bed, not in apparent distress CVS: pulse-normal rate and rhythm RS: breathing comfortably, intubated Extremities: normal, warm  Neuro: MS: Awake, alert, tracking examiner in room, not following commands CN: pupils equal and reactive,  EOMI, no apparent facial asymmetry, cough and gag reflex intact, rest of the cranial nerves unable to assess secondary to intubation Motor: Withdraws to noxious stimuli in all 4 extremities  Reflexes: 1+ bilaterally over patella, biceps, plantars: mute Coordination: Unable to assess secondary to intubation, generalized weakness Gait: not tested  Basic Metabolic Panel: Recent Labs  Lab 03/29/19 1804 03/29/19 August 16, 2133 03/29/19 2319 03/30/19 0242 03/30/19 0407 03/30/19 0550  NA 138  --  140 141 140 140  K 3.8  --  3.2* 4.3 2.8* 4.1  CL 94*  --   --  95*  --  94*  CO2 26  --   --  29  --  31  GLUCOSE 160*  --   --  91  --  107*  BUN 9  --   --  12  --  15  CREATININE 0.77  --   --  0.62  --  0.79  CALCIUM 9.7  --   --  9.6  --  9.6  MG  --  1.8  --  1.8  --   --   PHOS  --  4.2  --  3.4  --   --     CBC: Recent Labs  Lab 03/29/19 1804 03/29/19 2319 03/30/19 0242 03/30/19 0407  WBC 10.4  --  11.9*  --   NEUTROABS 4.8  --   --   --   HGB 18.0* 20.7* 17.7* 20.4*  HCT 63.9* 61.0* 61.8* 60.0*  MCV 85.2  --  82.3  --   PLT 197  --  171  --      Coagulation Studies: Recent Labs    03/29/19 16-Aug-2326  LABPROT 13.3  INR 1.0     Imaging CT head 03/29/2019: No acute abnormalities.  Questionable subtle nonspecific asymmetric hypodensity in left cerebellum.  Recommend MRI brain for further evaluation.  Chronic left PCA territory infarct and chronic lacunar infarcts in bilateral basal ganglia, right cerebellum.  CTA head and neck: No large vessel occlusion.  Questionable nonocclusive weblike stenosis in distal M1 left MCA.  ASSESSMENT AND PLAN 53 year old female who presented with new onset seizures and encephalopathy in the setting of cocaine use.   Encephalopathy Seizures Cocaine use -Stat EEG yesterday did not show any evidence of subclinical seizures, status epilepticus -Likely etiology for new onset seizures includes cocaine use versus PRES vs RCVS  Recommendations -As sedation is being weaned off and LTM did not show any seizures overnight, will discontinue LTM. -We will obtain MRI brain without contrast to look for any acute abnormality -Recommend SBP less than 140 -Continue Keppra 1 g twice daily.  Will decide if patient needs to continue this long-term pending MRI  results -Continue seizure precautions PRN IV Ativan 2 mg for generalized tonic-clonic seizure lasting more than 2 minutes.   [place]    CRITICAL CARE Performed by: Lora Havens   Total critical care time: 35 minutes  Critical care time was exclusive of separately billable procedures and treating other patients.  Critical care was necessary to treat or prevent imminent or life-threatening deterioration.  Critical care was time spent personally by me on the following activities: development of treatment plan with patient and/or surrogate as well as nursing, discussions with consultants, evaluation of patient's response to treatment, examination of patient, obtaining history from patient or surrogate, ordering and performing treatments and interventions, ordering and review of laboratory studies, ordering and review of  radiographic studies, pulse oximetry and re-evaluation of patient's condition.

## 2019-03-30 NOTE — ED Notes (Signed)
ED TO INPATIENT HANDOFF REPORT  ED Nurse Name and Phone #:  [phone]  S Name/Age/Gender Diane Gilbert 53 y.o. female Room/Bed: 025C/025C  Code Status   Code Status: Full Code  Home/SNF/Other Home Patient oriented to: NA Is this baseline? No   Triage Complete: Triage complete  Chief Complaint Seizure, Unresponsive  Triage Note Pt found by family on the ground unresponsive, drooling profusely. After EMS got her in the truck pt started seizing. EMS gave 2.5 mg versed en route. Pt unresponsive on arrival. Hx HTN, COPD, CHF, no hx of seizures. Pupils bloodshot and pinpoint. Sats 90 on room air. NRB 10 L/m sat 96%. No known LKW   Allergies Allergies  Allergen Reactions  . Unasyn [Ampicillin-Sulbactam Sodium] Swelling and Rash    angioedema  . Contrast Media [Iodinated Diagnostic Agents] Nausea And Vomiting  . Hydralazine Swelling    Level of Care/Admitting Diagnosis ED Disposition    ED Disposition Condition [facility] Area: [place] [100100]  Level of Care: ICU [6]  Covid Evaluation: Confirmed COVID Negative  Diagnosis: Status epilepticus [facility]) [[id number]]  Admitting Physician: Maryjane Hurter [[id number]]  Attending Physician: Maryjane Hurter [phone]  Estimated length of stay: 5 - 7 days  Certification:: I certify this patient will need inpatient services for at least 2 midnights  PT Class (Do Not Modify): Inpatient [101]  PT Acc Code (Do Not Modify): Private [1]       B Medical/Surgery History Past Medical History:  Diagnosis Date  . Acute on chronic respiratory failure with hypoxia ([place]) 06/07/2014  . Acute respiratory failure with hypoxia ([place]) 03/23/2017  . Alcohol abuse   . Anxiety   . Arthritis   . CHF (congestive heart failure) ([place])   . Community acquired pneumonia   . Depression   . Headache(784.0)   . Hypertension   . Insomnia   . Interstitial lung disease ([place])   . Oxygen dependent   .  Respiratory failure, acute and chronic ([place]) 05/09/2017  . Schizophrenia ([place])   . Shortness of breath dyspnea   . Sleep apnea   . Sleep apnea    Past Surgical History:  Procedure Laterality Date  . BACK SURGERY    . LUNG BIOPSY Left 07/16/2014   Procedure: LUNG BIOPSY;  Surgeon: Grace Isaac, MD;  Location: [place];  Service: Thoracic;  Laterality: Left;  Marland Kitchen MULTIPLE TOOTH EXTRACTIONS    . RIGHT/LEFT HEART CATH AND CORONARY ANGIOGRAPHY N/A 01/04/2018   Procedure: RIGHT/LEFT HEART CATH AND CORONARY ANGIOGRAPHY;  Surgeon: Adrian Prows, MD;  Location: [place] CV LAB;  Service: Cardiovascular;  Laterality: N/A;  . VIDEO ASSISTED THORACOSCOPY Left 07/16/2014   Procedure: VIDEO ASSISTED THORACOSCOPY;  Surgeon: Grace Isaac, MD;  Location: [place];  Service: Thoracic;  Laterality: Left;  Marland Kitchen VIDEO BRONCHOSCOPY Bilateral 04/11/2014   Procedure: VIDEO BRONCHOSCOPY WITH FLUORO;  Surgeon: Kathee Delton, MD;  Location: [facility];  Service: Cardiopulmonary;  Laterality: Bilateral;  . VIDEO BRONCHOSCOPY N/A 07/16/2014   Procedure: VIDEO BRONCHOSCOPY;  Surgeon: Grace Isaac, MD;  Location: [facility];  Service: Thoracic;  Laterality: N/A;  . WEDGE RESECTION Left 07/16/2014   Procedure: WEDGE RESECTION;  Surgeon: Grace Isaac, MD;  Location: [place];  Service: Thoracic;  Laterality: Left;  left upper lobe lung resection     A IV Location/Drains/Wounds Patient Lines/Drains/Airways Status   Active Line/Drains/Airways    Name:   Placement date:   Placement time:  Site:   Days:   Peripheral IV Right Antecubital   -    -    Antecubital      Peripheral IV 03/29/19 Anterior;Distal;Left Forearm   03/29/19    2057    Forearm   1   NG/OG Tube Orogastric Center mouth Xray;Aucultation   03/29/19    2334    Center mouth   1   Airway 7.5 mm   03/29/19    1905     1          Intake/Output Last 24 hours No intake or output data in the 24 hours ending 03/30/19 0017  Labs/Imaging Results for orders  placed or performed during the hospital encounter of 03/29/19 (from the past 48 hour(s))  CBG monitoring, ED     Status: Abnormal   Collection Time: 03/29/19  5:47 PM  Result Value Ref Range   Glucose-Capillary 158 (H) 70 - 99 mg/dL   Comment 1 Notify RN    Comment 2 Document in Chart   Ethanol     Status: None   Collection Time: 03/29/19  6:04 PM  Result Value Ref Range   Alcohol, Ethyl (B) <10 <10 mg/dL    Comment: (NOTE) Lowest detectable limit for serum alcohol is 10 mg/dL. For medical purposes only. Performed at [facility], [place] [address] [place], [place] [address]   CBC     Status: Abnormal   Collection Time: 03/29/19  6:04 PM  Result Value Ref Range   WBC 10.4 4.0 - 10.5 K/uL   RBC 7.50 (H) 3.87 - 5.11 MIL/uL   Hemoglobin 18.0 (H) 12.0 - 15.0 g/dL   HCT 63.9 (H) 36.0 - 46.0 %   MCV 85.2 80.0 - 100.0 fL   MCH 24.0 (L) 26.0 - 34.0 pg   MCHC 28.2 (L) 30.0 - 36.0 g/dL   RDW 20.1 (H) 11.5 - 15.5 %   Platelets 197 150 - 400 K/uL   nRBC 0.0 0.0 - 0.2 %    Comment: Performed at [facility] [address], [place], [place] [address]  Differential     Status: Abnormal   Collection Time: 03/29/19  6:04 PM  Result Value Ref Range   Neutrophils Relative % 46 %   Neutro Abs 4.8 1.7 - 7.7 K/uL   Lymphocytes Relative 48 %   Lymphs Abs 5.0 (H) 0.7 - 4.0 K/uL   Monocytes Relative 4 %   Monocytes Absolute 0.4 0.1 - 1.0 K/uL   Eosinophils Relative 1 %   Eosinophils Absolute 0.1 0.0 - 0.5 K/uL   Basophils Relative 1 %   Basophils Absolute 0.1 0.0 - 0.1 K/uL   Immature Granulocytes 0 %   Abs Immature Granulocytes 0.02 0.00 - 0.07 K/uL    Comment: Performed at [facility] [address], [place], [place] [address]  Comprehensive metabolic panel     Status: Abnormal   Collection Time: 03/29/19  6:04 PM  Result Value Ref Range   Sodium 138 135 - 145 mmol/L   Potassium 3.8 3.5 - 5.1 mmol/L   Chloride 94 (L) 98 - 111 mmol/L   CO2 26 22 - 32 mmol/L   Glucose,  Bld 160 (H) 70 - 99 mg/dL   BUN 9 6 - 20 mg/dL   Creatinine, Ser 0.77 0.44 - 1.00 mg/dL   Calcium 9.7 8.9 - 10.3 mg/dL   Total Protein 8.6 (H) 6.5 - 8.1 g/dL   Albumin 4.4 3.5 -  5.0 g/dL   AST 29 15 - 41 U/L   ALT 27 0 - 44 U/L   Alkaline Phosphatase 70 38 - 126 U/L   Total Bilirubin 0.8 0.3 - 1.2 mg/dL   GFR calc non Af Amer >60 >60 mL/min   GFR calc Af Amer >60 >60 mL/min   Anion gap 18 (H) 5 - 15    Comment: Performed at [facility] [address] [place], [place] [address]  Lactic acid, plasma     Status: Abnormal   Collection Time: 03/29/19  6:08 PM  Result Value Ref Range   Lactic Acid, Venous 7.3 (HH) 0.5 - 1.9 mmol/L    Comment: CRITICAL RESULT CALLED TO, READ BACK BY AND VERIFIED WITH: [facility] RN [id number] 03/29/2019 MCCORMICK K Performed at [facility] [address], [place], [place] [address]   I-Stat beta hCG blood, ED     Status: None   Collection Time: 03/29/19  6:19 PM  Result Value Ref Range   I-stat hCG, quantitative <5.0 <5 mIU/mL   Comment 3            Comment:   GEST. AGE      CONC.  (mIU/mL)   <=1 WEEK        5 - 50     2 WEEKS       50 - 500     3 WEEKS       100 - 10,000     4 WEEKS     1,000 - 30,000        FEMALE AND NON-PREGNANT FEMALE:     LESS THAN 5 mIU/mL   Urine rapid drug screen (hosp performed)     Status: Abnormal   Collection Time: 03/29/19  6:40 PM  Result Value Ref Range   Opiates NONE DETECTED NONE DETECTED   Cocaine POSITIVE (A) NONE DETECTED   Benzodiazepines POSITIVE (A) NONE DETECTED   Amphetamines NONE DETECTED NONE DETECTED   Tetrahydrocannabinol NONE DETECTED NONE DETECTED   Barbiturates NONE DETECTED NONE DETECTED    Comment: (NOTE) DRUG SCREEN FOR MEDICAL PURPOSES ONLY.  IF CONFIRMATION IS NEEDED FOR ANY PURPOSE, NOTIFY LAB WITHIN 5 DAYS. LOWEST DETECTABLE LIMITS FOR URINE DRUG SCREEN Drug Class                     Cutoff (ng/mL) Amphetamine and metabolites    1000 Barbiturate and metabolites     200 Benzodiazepine                 213 Tricyclics and metabolites     300 Opiates and metabolites        300 Cocaine and metabolites        300 THC                            50 Performed at [facility], [place] [address], [place],  [address]   Urinalysis, Routine w reflex microscopic     Status: Abnormal   Collection Time: 03/29/19  6:40 PM  Result Value Ref Range   Color, Urine STRAW (A) YELLOW   APPearance CLEAR CLEAR   Specific Gravity, Urine 1.028 1.005 - 1.030   pH 7.0 5.0 - 8.0   Glucose, UA NEGATIVE NEGATIVE mg/dL   Hgb urine dipstick NEGATIVE NEGATIVE   Bilirubin Urine NEGATIVE NEGATIVE   Ketones, ur NEGATIVE NEGATIVE mg/dL   Protein, ur  100 (A) NEGATIVE mg/dL   Nitrite NEGATIVE NEGATIVE   Leukocytes,Ua NEGATIVE NEGATIVE   RBC / HPF 0-5 0 - 5 RBC/hpf   WBC, UA 0-5 0 - 5 WBC/hpf   Bacteria, UA RARE (A) NONE SEEN   Squamous Epithelial / LPF 0-5 0 - 5    Comment: Performed at [facility], [place] [address] [place], [place] [address]  SARS Coronavirus 2 [facility] order, Performed in [facility] hospital lab) Nasopharyngeal Nasopharyngeal Swab     Status: None   Collection Time: 03/29/19  7:03 PM   Specimen: Nasopharyngeal Swab  Result Value Ref Range   SARS Coronavirus 2 NEGATIVE NEGATIVE    Comment: (NOTE) If result is NEGATIVE SARS-CoV-2 target nucleic acids are NOT DETECTED. The SARS-CoV-2 RNA is generally detectable in upper and lower  respiratory specimens during the acute phase of infection. The lowest  concentration of SARS-CoV-2 viral copies this assay can detect is 250  copies / mL. A negative result does not preclude SARS-CoV-2 infection  and should not be used as the sole basis for treatment or other  patient management decisions.  A negative result may occur with  improper specimen collection / handling, submission of specimen other  than nasopharyngeal swab, presence of viral mutation(s) within the  areas targeted by this assay, and  inadequate number of viral copies  (<250 copies / mL). A negative result must be combined with clinical  observations, patient history, and epidemiological information. If result is POSITIVE SARS-CoV-2 target nucleic acids are DETECTED. The SARS-CoV-2 RNA is generally detectable in upper and lower  respiratory specimens dur ing the acute phase of infection.  Positive  results are indicative of active infection with SARS-CoV-2.  Clinical  correlation with patient history and other diagnostic information is  necessary to determine patient infection status.  Positive results do  not rule out bacterial infection or co-infection with other viruses. If result is PRESUMPTIVE POSTIVE SARS-CoV-2 nucleic acids MAY BE PRESENT.   A presumptive positive result was obtained on the submitted specimen  and confirmed on repeat testing.  While 2019 novel coronavirus  (SARS-CoV-2) nucleic acids may be present in the submitted sample  additional confirmatory testing may be necessary for epidemiological  and / or clinical management purposes  to differentiate between  SARS-CoV-2 and other Sarbecovirus currently known to infect humans.  If clinically indicated additional testing with an alternate test  methodology [id number]) is advised. The SARS-CoV-2 RNA is generally  detectable in upper and lower respiratory sp ecimens during the acute  phase of infection. The expected result is Negative. Fact Sheet for Patients:  StrictlyIdeas.no Fact Sheet for Healthcare Providers: BankingDealers.co.za This test is not yet approved or cleared by the [place] FDA and has been authorized for detection and/or diagnosis of SARS-CoV-2 by FDA under an Emergency Use Authorization (EUA).  This EUA will remain in effect (meaning this test can be used) for the duration of the COVID-19 declaration under Section 564(b)(1) of the Act, 21 U.S.C. section 360bbb-3(b)(1), unless the  authorization is terminated or revoked sooner. Performed at [facility], [place] [address] [place], [place] [address]   Lactic acid, plasma     Status: Abnormal   Collection Time: 03/29/19  9:35 PM  Result Value Ref Range   Lactic Acid, Venous 2.7 (HH) 0.5 - 1.9 mmol/L    Comment: CRITICAL VALUE NOTED.  VALUE IS CONSISTENT WITH PREVIOUSLY REPORTED AND CALLED VALUE. Performed at [facility], [place]  [address] [place], [place] [address]   Magnesium     Status: None   Collection Time: 03/29/19  9:35 PM  Result Value Ref Range   Magnesium 1.8 1.7 - 2.4 mg/dL    Comment: Performed at [facility] [address], [place], [place] [address]  TSH     Status: None   Collection Time: 03/29/19  9:35 PM  Result Value Ref Range   TSH 0.615 0.350 - 4.500 uIU/mL    Comment: Performed by a 3rd Generation assay with a functional sensitivity of <=0.01 uIU/mL. Performed at [facility], [place] [address] [place], [place] [address]   T4, free     Status: None   Collection Time: 03/29/19  9:35 PM  Result Value Ref Range   Free T4 0.98 0.61 - 1.12 ng/dL    Comment: (NOTE) Biotin ingestion may interfere with free T4 tests. If the results are inconsistent with the TSH level, previous test results, or the clinical presentation, then consider biotin interference. If needed, order repeat testing after stopping biotin. Performed at [facility], [place] [address] [place], [place] [address]   Phosphorus     Status: None   Collection Time: 03/29/19  9:35 PM  Result Value Ref Range   Phosphorus 4.2 2.5 - 4.6 mg/dL    Comment: Performed at [facility] [address] [place], [place] [address]  Troponin I (High Sensitivity)     Status: None   Collection Time: 03/29/19  9:35 PM  Result Value Ref Range   Troponin I (High Sensitivity) 17 <18 ng/L    Comment: (NOTE) Elevated high sensitivity troponin I (hsTnI) values and significant  changes across serial measurements may  suggest ACS but many other  chronic and acute conditions are known to elevate hsTnI results.  Refer to the "Links" section for chest pain algorithms and additional  guidance. Performed at [facility], [place] [address], [place], [place] [address]   I-STAT 7, (LYTES, BLD GAS, ICA, H+H)     Status: Abnormal   Collection Time: 03/29/19 11:19 PM  Result Value Ref Range   pH, Arterial 7.362 7.350 - 7.450   pCO2 arterial 65.9 (HH) 32.0 - 48.0 mmHg   pO2, Arterial 69.0 (L) 83.0 - 108.0 mmHg   Bicarbonate 37.4 (H) 20.0 - 28.0 mmol/L   TCO2 39 (H) 22 - 32 mmol/L   O2 Saturation 92.0 %   Acid-Base Excess 8.0 (H) 0.0 - 2.0 mmol/L   Sodium 140 135 - 145 mmol/L   Potassium 3.2 (L) 3.5 - 5.1 mmol/L   Calcium, Ion 1.22 1.15 - 1.40 mmol/L   HCT 61.0 (H) 36.0 - 46.0 %   Hemoglobin 20.7 (H) 12.0 - 15.0 g/dL   Patient temperature HIDE    Collection site RADIAL, ALLEN'S TEST ACCEPTABLE    Sample type ARTERIAL    Comment NOTIFIED PHYSICIAN   Protime-INR     Status: None   Collection Time: 03/29/19 11:28 PM  Result Value Ref Range   Prothrombin Time 13.3 11.4 - 15.2 seconds   INR 1.0 0.8 - 1.2    Comment: (NOTE) INR goal varies based on device and disease states. Performed at [facility], [place] [address] [place],  [address]   APTT     Status: None   Collection Time: 03/29/19 11:28 PM  Result Value Ref Range   aPTT 28 24 - 36 seconds    Comment: Performed at [facility], [place] [address] [place], [place] [address]   Ct Angio Head W Or Wo Contrast  Result Date: 03/29/2019 CLINICAL DATA:  Code stroke. Focal neuro deficit, greater than 6 hours, stroke suspected. EXAM: CT ANGIOGRAPHY HEAD AND NECK TECHNIQUE: Multidetector CT imaging of the head and neck was performed using the standard protocol during bolus administration of intravenous contrast. Multiplanar CT image reconstructions and MIPs were obtained to evaluate the vascular anatomy. Carotid stenosis measurements (when  applicable) are obtained utilizing NASCET criteria, using the distal internal carotid diameter as the denominator. CONTRAST:  12m OMNIPAQUE IOHEXOL 300 MG/ML  SOLN COMPARISON:  Noncontrast head CT 03/29/2019, brain MRI 03/08/2019 FINDINGS: CTA NECK FINDINGS The examination is significantly motion degraded, limiting evaluation Aortic arch: Standard branching. Included portions of the aortic arch demonstrate no evidence of dissection or aneurysm. Right carotid system: The right common and cervical internal carotid arteries appear patent without significant stenosis (50% or greater). Soft and calcified plaque at the carotid bifurcation Left carotid system: The left common and cervical internal carotid arteries appear patent without significant stenosis (50% or greater). Soft and calcified plaque at the carotid bifurcation Vertebral arteries: The bilateral vertebral arteries are patent within the neck. Significant motion degradation limits evaluation for stenoses within these vessels. Skeleton: Straightening of the expected cervical lordosis. Subacute appearing fracture of the anterior right first rib. Other neck: The glottis is closed. However, there is apparent symmetric swelling of the glottic/supraglottic soft tissues with questionable airway narrowing. Upper chest: Extensive patchy opacity within the bilateral lung apices likely reflecting aspiration/pneumonia. Review of the MIP images confirms the above findings CTA HEAD FINDINGS Significant motion degradation limits evaluation. Anterior circulation: The bilateral intracranial internal carotid arteries are patent. Soft and calcified plaque within the distal cavernous left ICA results in mild luminal narrowing. The right middle and anterior cerebral arteries are patent without evidence of high-grade proximal stenosis. The left middle and anterior cerebral arteries are patent. Question nonocclusive web-like stenosis within the distal M1 left middle cerebral artery  (series 8, image 103). No intracranial aneurysm is identified Posterior circulation: The intracranial vertebral arteries are patent without convincing evidence of high-grade stenosis. Mild atherosclerotic irregularity of the basilar artery. The basilar artery is patent without high-grade stenosis. The bilateral posterior cerebral arteries are patent without evidence of high-grade proximal stenosis. Predominantly fetal origin of the right PCA. Please note that motion artifact significantly limits evaluation of the cerebellar arteries Venous sinuses: Within limitations of contrast timing, no convincing thrombus. Anatomic variants: None significant Review of the MIP images confirms the above findings These results were called by telephone at the time of interpretation on 03/29/2019 at 6:35 pm to provider [facility], who verbally acknowledged these results. IMPRESSION: CTA head: 1. Evaluation significantly limited by motion degradation. 2. No intracranial large vessel occlusion identified. 3. Question nonocclusive web-like stenosis within the distal M1 left middle cerebral artery. 4. Atherosclerotic plaque results in mild narrowing of the distal cavernous left ICA. 5. Please note motion significant limits evaluation of the cerebellar arteries. CTA neck: 1. Evaluation significantly limited by motion artifact. 2. The common and cervical internal carotid arteries appear patent within the neck without significant stenosis (50% or greater). Soft and calcified plaque at the carotid bifurcations bilaterally. 3. The vertebral arteries appear patent throughout the neck. Significant motion degradation limits evaluation for stenoses within these vessels. 4. Apparent symmetric swelling of the glottic/supraglottic soft tissues with possible airway narrowing. Clinical correlation recommended. 5. Extensive patchy opacity within the imaged lung  apices likely reflecting sequela of aspiration/pneumonia. 6. Subacute appearing  fracture of the anterior right first rib. Electronically Signed   By: Kellie Simmering   On: 03/29/2019 19:13   Ct Angio Neck W Or Wo Contrast  Result Date: 03/29/2019 CLINICAL DATA:  Code stroke. Focal neuro deficit, greater than 6 hours, stroke suspected. EXAM: CT ANGIOGRAPHY HEAD AND NECK TECHNIQUE: Multidetector CT imaging of the head and neck was performed using the standard protocol during bolus administration of intravenous contrast. Multiplanar CT image reconstructions and MIPs were obtained to evaluate the vascular anatomy. Carotid stenosis measurements (when applicable) are obtained utilizing NASCET criteria, using the distal internal carotid diameter as the denominator. CONTRAST:  66m OMNIPAQUE IOHEXOL 300 MG/ML  SOLN COMPARISON:  Noncontrast head CT 03/29/2019, brain MRI 03/08/2019 FINDINGS: CTA NECK FINDINGS The examination is significantly motion degraded, limiting evaluation Aortic arch: Standard branching. Included portions of the aortic arch demonstrate no evidence of dissection or aneurysm. Right carotid system: The right common and cervical internal carotid arteries appear patent without significant stenosis (50% or greater). Soft and calcified plaque at the carotid bifurcation Left carotid system: The left common and cervical internal carotid arteries appear patent without significant stenosis (50% or greater). Soft and calcified plaque at the carotid bifurcation Vertebral arteries: The bilateral vertebral arteries are patent within the neck. Significant motion degradation limits evaluation for stenoses within these vessels. Skeleton: Straightening of the expected cervical lordosis. Subacute appearing fracture of the anterior right first rib. Other neck: The glottis is closed. However, there is apparent symmetric swelling of the glottic/supraglottic soft tissues with questionable airway narrowing. Upper chest: Extensive patchy opacity within the bilateral lung apices likely reflecting  aspiration/pneumonia. Review of the MIP images confirms the above findings CTA HEAD FINDINGS Significant motion degradation limits evaluation. Anterior circulation: The bilateral intracranial internal carotid arteries are patent. Soft and calcified plaque within the distal cavernous left ICA results in mild luminal narrowing. The right middle and anterior cerebral arteries are patent without evidence of high-grade proximal stenosis. The left middle and anterior cerebral arteries are patent. Question nonocclusive web-like stenosis within the distal M1 left middle cerebral artery (series 8, image 103). No intracranial aneurysm is identified Posterior circulation: The intracranial vertebral arteries are patent without convincing evidence of high-grade stenosis. Mild atherosclerotic irregularity of the basilar artery. The basilar artery is patent without high-grade stenosis. The bilateral posterior cerebral arteries are patent without evidence of high-grade proximal stenosis. Predominantly fetal origin of the right PCA. Please note that motion artifact significantly limits evaluation of the cerebellar arteries Venous sinuses: Within limitations of contrast timing, no convincing thrombus. Anatomic variants: None significant Review of the MIP images confirms the above findings These results were called by telephone at the time of interpretation on 03/29/2019 at 6:35 pm to provider [facility], who verbally acknowledged these results. IMPRESSION: CTA head: 1. Evaluation significantly limited by motion degradation. 2. No intracranial large vessel occlusion identified. 3. Question nonocclusive web-like stenosis within the distal M1 left middle cerebral artery. 4. Atherosclerotic plaque results in mild narrowing of the distal cavernous left ICA. 5. Please note motion significant limits evaluation of the cerebellar arteries. CTA neck: 1. Evaluation significantly limited by motion artifact. 2. The common and cervical  internal carotid arteries appear patent within the neck without significant stenosis (50% or greater). Soft and calcified plaque at the carotid bifurcations bilaterally. 3. The vertebral arteries appear patent throughout the neck. Significant motion degradation limits evaluation for stenoses within these vessels. 4. Apparent symmetric  swelling of the glottic/supraglottic soft tissues with possible airway narrowing. Clinical correlation recommended. 5. Extensive patchy opacity within the imaged lung apices likely reflecting sequela of aspiration/pneumonia. 6. Subacute appearing fracture of the anterior right first rib. Electronically Signed   By: Kellie Simmering   On: 03/29/2019 19:13   Dg Chest Portable 1 View  Result Date: 03/29/2019 CLINICAL DATA:  Post intubation EXAM: PORTABLE CHEST 1 VIEW COMPARISON:  Radiograph 03/07/2019, CT February 22, 2019 FINDINGS: Interval placement of an endotracheal tube position low within the trachea approximately 1.5 cm from the carina should be retracted at least 2 cm to the mid trachea. A transesophageal tube tip and side port distal to the GE junction curling in the left upper quadrant. There is obscuration of the right hemidiaphragm suggesting a pleural effusion likely with some adjacent atelectasis given the rightward mediastinal shift. Additional density silhouetting the right heart border may reflect partial collapse of the right lower lobe. Diffuse interstitial and airspace opacities are similar in extent to prior exam with a perihilar predominance. Cardiomegaly is unchanged accounting for differences in technique. IMPRESSION: 1. Interval placement of an endotracheal tube within the trachea, approximately 1.5 cm from the carina should be retracted at least 2 cm to the mid trachea. 2. A transesophageal tube tip and side port along the GE junction curling in the left upper quadrant. 3. Interstitial and airspace opacities suggestive of pulmonary edema with cardiomegaly.  Underlying infection is not excluded. 4. Density along the right heart border with rightward mediastinal shift suggesting partial collapse of the right lower lobe. Electronically Signed   By: Lovena Le M.D.   On: 03/29/2019 20:02   Ct Head Code Stroke Wo Contrast  Result Date: 03/29/2019 CLINICAL DATA:  Code stroke. Altered mental status (AMS), unclear cause. Additional history provided: Patient found by family on the ground unresponsive, drooling profusely. Subsequent seizure. aa EXAM: CT HEAD WITHOUT CONTRAST TECHNIQUE: Contiguous axial images were obtained from the base of the skull through the vertex without intravenous contrast. COMPARISON:  Brain MRI 03/08/2011, head CT 03/07/2019 FINDINGS: Brain: No evidence of acute intracranial hemorrhage. No acute demarcated cortical infarction is identified. Redemonstrated chronic left PCA territory infarct. Known chronic lacunar infarcts within the bilateral basal ganglia and right cerebellum. Question subtle asymmetric hypodensity within the left cerebellum (series 2, image 9). No evidence of intracranial mass. No midline shift or extra-axial fluid collection. Ill-defined hypoattenuation of the cerebral white matter is nonspecific, but consistent with chronic small vessel ischemic disease Cerebral volume is normal for age. Vascular: No hyperdense vessel. Skull: No calvarial fracture Sinuses/Orbits: Visualized orbits demonstrate no acute abnormality. Minimal scattered paranasal sinus mucosal thickening. No significant mastoid effusion. ASPECTS [facility] Stroke Program Early CT Score) not provided given history. These results were called by telephone at the time of interpretation on 03/29/2019 at 6:35 pm to provider Dr. Leonel Ramsay, who verbally acknowledged these results. IMPRESSION: 1. No evidence of acute intracranial hemorrhage or acute demarcated cortical infarction. 2. Question subtle nonspecific asymmetric hypodensity within the left cerebellum. Consider brain  MRI for further evaluation. 3. Redemonstrated chronic left PCA territory infarct. 4. Known chronic lacunar infarcts within the bilateral basal ganglia and right cerebellum. 5. Chronic small vessel ischemic disease. Electronically Signed   By: Kellie Simmering   On: 03/29/2019 18:41    Pending Labs Unresulted Labs (From admission, onward)    Start     Ordered   03/30/19 0500  CBC  Tomorrow morning,   R     03/29/19 2343  03/30/19 4680  Basic metabolic panel  Tomorrow morning,   R     03/29/19 2343   03/30/19 0500  Magnesium  Tomorrow morning,   R     03/29/19 2343   03/30/19 0500  Phosphorus  Tomorrow morning,   R     03/29/19 2343   03/30/19 0500  Blood gas, arterial  Tomorrow morning,   R     03/29/19 2343   03/30/19 0500  Triglycerides  (propofol (DIPRIVAN))  Daily,   R    Comments: While on propofol (DIPRIVAN)    03/29/19 2345   03/29/19 2345  Hemoglobin A1c  Once,   STAT    Comments: To assess prior glycemic control    03/29/19 2345   03/29/19 2337  Acetaminophen level  Add-on,   AD     03/29/19 2336   32/12/24 8250  Salicylate level  Add-on,   AD     03/29/19 2336   03/29/19 2303  Blood gas, arterial  Once,   R     03/29/19 2302          Vitals/Pain Today's Vitals   03/29/19 2130 03/29/19 2200 03/29/19 2230 03/29/19 2300  BP: 101/68 100/64 113/72 91/63  Pulse: 79 74 75   Resp: _0 Temp:      TempSrc:      SpO2: 96% 94% 95%     Isolation Precautions No active isolations  Medications Medications  levETIRAcetam (KEPPRA) IVPB 1000 mg/100 mL premix (0 mg Intravenous Stopping Infusion hung by another clincian 03/29/19 2308)  heparin injection 5,000 Units (has no administration in time range)  famotidine (PEPCID) 40 MG/5ML suspension 20 mg (has no administration in time range)  0.9 %  sodium chloride infusion (has no administration in time range)  ipratropium-albuterol (DUONEB) 0.5-2.5 (3) MG/3ML nebulizer solution 3 mL (has no administration in time range)   budesonide (PULMICORT) nebulizer solution 0.5 mg (has no administration in time range)  insulin aspart (novoLOG) injection 0-15 Units (has no administration in time range)  fentaNYL (SUBLIMAZE) injection 50 mcg (50 mcg Intravenous Given 03/30/19 0017)  fentaNYL (SUBLIMAZE) injection 50-200 mcg (has no administration in time range)  propofol (DIPRIVAN) 1000 MG/100ML infusion (33.568 mcg/kg/min  99.3 kg Intravenous Rate/Dose Change 03/30/19 0015)  iohexol (OMNIPAQUE) 300 MG/ML solution 75 mL (75 mLs Intravenous Contrast Given 03/29/19 1832)  naloxone (NARCAN) 0.4 MG/ML injection (  Given 03/29/19 1850)  levETIRAcetam (KEPPRA) IVPB 1000 mg/100 mL premix (0 mg Intravenous Stopped 03/29/19 1915)  propofol (DIPRIVAN) 1000 MG/100ML infusion (  Rate/Dose Change 03/29/19 2302)  metroNIDAZOLE (FLAGYL) IVPB 500 mg (0 mg Intravenous Stopped 03/29/19 2146)  fentaNYL (SUBLIMAZE) injection 50 mcg (50 mcg Intravenous Given 03/29/19 2231)  propofol (DIPRIVAN) 1000 MG/100ML infusion (  Started During Downtime 03/29/19 2316)    Mobility Non ambulatory at this time High fall risk   Focused Assessments Pulmonary Assessment Handoff:  Lung sounds: Bilateral Breath Sounds: Clear, Diminished O2 Device: Ventilator        R Recommendations: See Admitting Provider Note  Report given to:   Additional Notes: -

## 2019-03-30 NOTE — Procedures (Addendum)
Patient Name: Diane Gilbert  MRN: [id number]  Epilepsy Attending: Lora Havens  Referring Physician/Provider: Dr Roland Rack Duration:  03/29/2019 2031 to 03/30/2019 1246  Patient history: 53yo F with seizure and ams. EEG to evaluate for status.  Level of alertness: lethargic  AEDs during EEG study: Keppra, propofol  Technical aspects: This EEG study was done with scalp electrodes positioned according to the 10-20 International system of electrode placement. Electrical activity was acquired at a sampling rate of _0  and reviewed with a high frequency filter of _1  and a low frequency filter of _2 . EEG data were recorded continuously and digitally stored.   DESCRIPTION: EEG showed continuous generalized high amplitude 2-_3  rhythmic delta slowing, maximal left hemisphere. EEG was reactive to tactile stimulation.  Hyperventilation and photic stimulation were not performed.  Patient event button was pressed on 03/30/2019 at 7 AM for unclear reason.  Concomitant EEG before during and after the event did not show any EEG change to suggest seizures.  ABNORMALITY: - Continuous rhythmic slow, generalized, lateralized left  IMPRESSION: This study is suggestive of non specific cortical dysfunction in left hemisphere and severe diffuse encephalopathy, likely secondary to sedated state. No seizures or epileptiform discharges were seen throughout the recording.  Patient button event was placed on 03/30/2019 at 7 AM for unclear reason.  Concomitant EEG did not show any change to suggest seizure.  Kuzey Ogata Barbra Sarks

## 2019-03-30 NOTE — Progress Notes (Signed)
Patient suddenly tachycardiac with widening QRS complexes, STAT EKG obtained, hand delivered to provider.

## 2019-03-30 NOTE — Progress Notes (Signed)
eLink Physician-Brief Progress Note Patient Name: Diane Gilbert DOB: 1965-12-22 MRN: [id number]   Date of Service  03/30/2019  HPI/Events of Note  Pt with frequent PVC's, K+ 3.2, Mg++ 1.8, Troponin # 1 17  eICU Interventions  Replete K+ and Mg++ per Elink electrolyte replacement protocol, follow up Troponin's to exclude cocaine induced acute coronary syndrome.        Diane Gilbert 03/30/2019, 2:36 AM

## 2019-03-30 NOTE — Progress Notes (Signed)
LTM EEG discontinued - no skin breakdown at [facility].

## 2019-03-30 NOTE — Progress Notes (Signed)
[place] Progress Note Patient Name: KASLYN RICHBURG DOB: Oct 15, 1965 MRN: [id number]   Date of Service  03/30/2019  HPI/Events of Note  Inconsistency between I-stat K+ of 2.8 and BMET K+ of 4.3.  eICU Interventions  Recheck BMET        Frederik Pear 03/30/2019, 4:26 AM

## 2019-03-30 NOTE — Procedures (Signed)
Extubation Procedure Note  Patient Details:   Name: Diane Gilbert DOB: [birth year] MRN: [id number]   Airway Documentation:    Vent end date: 03/30/19 Vent end time: 1125   Evaluation  O2 sats: transiently fell during during procedure and currently acceptable Complications: No apparent complications Patient did tolerate procedure well. Bilateral Breath Sounds: Clear, Diminished   Yes   Pt extubated to 3L, increased to 15L HFNC due to low spo2. Pt able to speak but unable or unwilling to cough at this time. Pt NT suctioned for sputum sample per MD order. Positive cuff leak noted prior to extubation. No stridor noted, however, rhonchi heard in upper airway and bilaterally throughout. MD to bedside to evaluate  Jesse Sans 03/30/2019, 11:33 AM

## 2019-03-30 NOTE — Progress Notes (Addendum)
Patient continues to have ventricular bigeminy on monitor frequently. Asymptomatic. Receiving Magnesium sulfate 2g IV now. Stopped potassium chloride due to K+ result of 4.3 this morning. Labs were taken before potassium or magnesium was given. Notified Elink. Per Dr. Lucile Shutters, discontinue potassium and finish magnesium. Orders carried out. Will continue to monitor.

## 2019-03-30 NOTE — Progress Notes (Signed)
Reviewed, agree

## 2019-03-30 NOTE — Progress Notes (Signed)
NAME:  Diane Gilbert, MRN:  [id number], DOB:  1965/07/12, LOS: 1 ADMISSION DATE:  03/29/2019, CONSULTATION DATE:  9/30 REFERRING MD:  Dr Billy Fischer, CHIEF COMPLAINT:  Status epilepticus  Brief History:   53 year old female with multiple chronic medical issues was found down and unresponsive 9/30. Grand mal seizure en route to ED. Intubated for airway protection after not returning to baseline mental status.   History of present illness  Patient is encephalopathic and/or intubated. Therefore history has been obtained from chart review.  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 She is also a current everyday smoker. She was recently admitted twice to [facility]. Once for running out of oxygen and once for confusion (her O2 tubing was kinked and SpO2 50%). She was also found to have subacute/chronic PCA infarct. Mental status improved with supplemental O2 and she was recommended to discharge to SNF, which she refused, and was discharged to home 9/13.  Then 9/30 was found down at home in the early evening hours. Last known well 1030 am that morning. EMS was called and she was transported to ED, which was complicated by a convulsive seizure en route, which was improved with versed. Upon arrival to ER she had R hemiparesis and R gaze deviation. She was also profoundly hypertensive. Code stroke was activated. Neuroimaging was negative. After CT she remained unresponsive and was intubated for airway protection. She was loaded with Keppra and started on EEG in ED. PCCM asked to admit. Notably, her UDS was positive for cocaine.   Past Medical History   has a past medical history of Acute on chronic respiratory failure with hypoxia ([place]) (06/07/2014), Acute respiratory failure with hypoxia ([place]) (03/23/2017), Alcohol abuse, Anxiety, Arthritis, CHF (congestive heart failure) ([place]), Community  acquired pneumonia, Depression, Headache(784.0), Hypertension, Insomnia, Interstitial lung disease ([place]), Oxygen dependent, Respiratory failure, acute and chronic ([place]) (05/09/2017), Schizophrenia ([place]), Shortness of breath dyspnea, Sleep apnea, and Sleep apnea.  Significant Hospital Events   9/30 admit, intubated for status epilepticus  Consults:  Neurology  Procedures:  ETT 9/30 >  Significant Diagnostic Tests:  EEG 9/30 >  Micro Data:  03/30/2019 sputum>> 03/30/2019 blood cultures x2>>  Antimicrobials:  Cefepime 9/30>> 03/29/2019 Flagyl 9/30>> 03/29/2019   Interim history/subjective:   Periods of bigeminy.  Conflicting results on potassium continue to monitor.  Wean as tolerated  Objective   Blood pressure (!) 102/56, pulse 82, temperature 98.5 F (36.9 C), temperature source Axillary, resp. rate 19, height _0  (1.626 m), weight 95.4 kg, last menstrual period 04/06/2017, SpO2 100 %.    Vent Mode: PRVC FiO2 (%):  [50 %-100 %] 50 % Set Rate:  [18 bmp-20 bmp] 20 bmp Vt Set:  [440 mL] 440 mL PEEP:  [5 cmH20] 5 cmH20 Plateau Pressure:  [22 cmH20-23 cmH20] 22 cmH20   Intake/Output Summary (Last 24 hours) at 03/30/2019 0852 Last data filed at 03/30/2019 6122 Gross per 24 hour  Intake 631.05 ml  Output --  Net 631.05 ml   Filed Weights   03/30/19 0215  Weight: 95.4 kg    Examination: General: Morbidly obese female currently sedated on full mechanical ventilatory support HEENT: Endotracheal tube gastric tube replaced Neuro: Heavily sedated.  Withdraws to noxious stimuli CV: Heart sounds are regular.  Intermittent periods of bigeminy PULM: Decreased breath sounds in the bases coarse rhonchi.  FiO2 decreased to 40% GI: soft,  bsx4 active  Extremities: warm/dry 1+ edema  Skin: no rashes or lesions   Resolved Hospital Problem list     Assessment & Plan:   Status epilepticus: no history of seizure. ddx includes recent stroke and cocaine intoxication  Appreciate  neurology's involvement EEG MRI pending Continue Keppra 1 g twice daily  Acute on chronic hypoxemic respiratory failure Bronchiolitis interstitial lung disease COPD without acute exacerbation Possible aspiration noted on CT neck.  Wean per protocol Neurological status is a block to extubation Bronchodilators 1 dose of antimicrobial therapy panculture Monitor culture data   Hypertensive emergency: seems acute in the setting of cocaine. Bp was 152/100 in pulmonary clinic 9/29. Currently blood pressure is less than 140 Continue to hold Lasix losartan metoprolol for now especially in the setting of cocaine abuse  Frequent arrhythmia with episodes of bigeminy Replete electrolytes as needed Twelve-lead EKG Check troponin for completeness.  Noted to be 13 of high sensitive Avoid beta blockers at this time  OSA: Noncompliant with CPAP Encouraged to use CPAP in the future  Substance abuse: Cocaine positive on UDS Counseling in the near future Treatment plan aspirin levels are normal  Schizophrenia Continue to hold home Abilify Neurontin and Seroquel trazodone at this time Propofol fentanyl for sedation  Best practice:  Diet: NPO Pain/Anxiety/Delirium protocol (if indicated): propofol for RASS -1 to -2 VAP protocol (if indicated): Yes DVT prophylaxis: SQ heparin GI prophylaxis:  H2  Glucose control: Na Mobility: BR Code Status: FULL Family Communication: family unable to talk to EDP due to party going on.  Disposition: ICU  Labs   CBC: Recent Labs  Lab 03/29/19 1804 03/29/19 2319 03/30/19 0242 03/30/19 0407  WBC 10.4  --  11.9*  --   NEUTROABS 4.8  --   --   --   HGB 18.0* 20.7* 17.7* 20.4*  HCT 63.9* 61.0* 61.8* 60.0*  MCV 85.2  --  82.3  --   PLT 197  --  171  --     Basic Metabolic Panel: Recent Labs  Lab 03/29/19 1804 03/29/19 2135 03/29/19 2319 03/30/19 0242 03/30/19 0407 03/30/19 0550  NA 138  --  140 141 140 140  K 3.8  --  3.2* 4.3 2.8* 4.1    CL 94*  --   --  95*  --  94*  CO2 26  --   --  29  --  31  GLUCOSE 160*  --   --  91  --  107*  BUN 9  --   --  12  --  15  CREATININE 0.77  --   --  0.62  --  0.79  CALCIUM 9.7  --   --  9.6  --  9.6  MG  --  1.8  --  1.8  --   --   PHOS  --  4.2  --  3.4  --   --    GFR: Estimated Creatinine Clearance: 94.3 mL/min (by C-G formula based on SCr of 0.79 mg/dL). Recent Labs  Lab 03/29/19 1804 03/29/19 1808 03/29/19 2135 03/30/19 0242  WBC 10.4  --   --  11.9*  LATICACIDVEN  --  7.3* 2.7*  --     Liver Function Tests: Recent Labs  Lab 03/29/19 1804  AST 29  ALT 27  ALKPHOS 70  BILITOT 0.8  PROT 8.6*  ALBUMIN 4.4   No results for input(s): LIPASE, AMYLASE in the last 168 hours. No results for input(s): AMMONIA in the  last 168 hours.  ABG    Component Value Date/Time   PHART 7.422 03/30/2019 0407   PCO2ART 57.0 (H) 03/30/2019 0407   PO2ART 68.0 (L) 03/30/2019 0407   HCO3 37.1 (H) 03/30/2019 0407   TCO2 39 (H) 03/30/2019 0407   O2SAT 93.0 03/30/2019 0407     Coagulation Profile: Recent Labs  Lab 03/29/19 2328  INR 1.0    Cardiac Enzymes: No results for input(s): CKTOTAL, CKMB, CKMBINDEX, TROPONINI in the last 168 hours.  HbA1C: Hgb A1c MFr Bld  Date/Time Value Ref Range Status  03/30/2019 02:42 AM 6.0 (H) 4.8 - 5.6 % Final    Comment:    (NOTE) Pre diabetes:          5.7%-6.4% Diabetes:              >6.4% Glycemic control for   <7.0% adults with diabetes   07/04/2016 05:58 PM 6.2 (H) 4.8 - 5.6 % Final    Comment:    (NOTE)         Pre-diabetes: 5.7 - 6.4         Diabetes: >6.4         Glycemic control for adults with diabetes: <7.0     CBG: Recent Labs  Lab 03/29/19 1747 03/30/19 0122 03/30/19 0213 03/30/19 0418 03/30/19 0730  GLUCAP 158* 91 104* 113* 131*      Critical care time: 36 mins   Steve Bernarda Erck ACNP Maryanna Shape PCCM Pager [phone] till 1 pm If no answer page [phone] 03/30/2019, 8:53 AM

## 2019-03-30 NOTE — Progress Notes (Signed)
Patient displaying increased frequency in changes in heart rhythm (Bigeminy and normal sinus rhythm), patient reports no pain or distress, patient is however confused and repeats birthday for all orientation questions, provider notified, no orders at this time. Will continue to monitor.

## 2019-03-30 NOTE — Progress Notes (Signed)
[facility] Progress Note Patient Name: EMELINA HINCH DOB: 11-30-1965 MRN: [id number]   Date of Service  03/30/2019  HPI/Events of Note  Pt with a history of cocaine abuse admitted following intubation for acute respiratory failure in the context of + toxicology screen for cocaine and status epilepticus.  eICU Interventions  New patient evaluation completed.        Kerina Simoneau U Makayla Confer 03/30/2019, 2:20 AM

## 2019-03-31 ENCOUNTER — Inpatient Hospital Stay (HOSPITAL_COMMUNITY): Payer: Medicaid Other

## 2019-03-31 LAB — CBC WITH DIFFERENTIAL/PLATELET
Abs Immature Granulocytes: 0.01 10*3/uL (ref 0.00–0.07)
Basophils Absolute: 0.1 10*3/uL (ref 0.0–0.1)
Basophils Relative: 1 %
Eosinophils Absolute: 0.1 10*3/uL (ref 0.0–0.5)
Eosinophils Relative: 1 %
HCT: 59.8 % — ABNORMAL HIGH (ref 36.0–46.0)
Hemoglobin: 17.1 g/dL — ABNORMAL HIGH (ref 12.0–15.0)
Immature Granulocytes: 0 %
Lymphocytes Relative: 40 %
Lymphs Abs: 3.5 10*3/uL (ref 0.7–4.0)
MCH: 24.1 pg — ABNORMAL LOW (ref 26.0–34.0)
MCHC: 28.6 g/dL — ABNORMAL LOW (ref 30.0–36.0)
MCV: 84.3 fL (ref 80.0–100.0)
Monocytes Absolute: 0.5 10*3/uL (ref 0.1–1.0)
Monocytes Relative: 6 %
Neutro Abs: 4.6 10*3/uL (ref 1.7–7.7)
Neutrophils Relative %: 52 %
Platelets: 152 10*3/uL (ref 150–400)
RBC: 7.09 MIL/uL — ABNORMAL HIGH (ref 3.87–5.11)
RDW: 20.2 % — ABNORMAL HIGH (ref 11.5–15.5)
WBC: 8.8 10*3/uL (ref 4.0–10.5)
nRBC: 0 % (ref 0.0–0.2)

## 2019-03-31 LAB — GLUCOSE, CAPILLARY
Glucose-Capillary: 144 mg/dL — ABNORMAL HIGH (ref 70–99)
Glucose-Capillary: 159 mg/dL — ABNORMAL HIGH (ref 70–99)
Glucose-Capillary: 175 mg/dL — ABNORMAL HIGH (ref 70–99)
Glucose-Capillary: 80 mg/dL (ref 70–99)
Glucose-Capillary: 82 mg/dL (ref 70–99)
Glucose-Capillary: 83 mg/dL (ref 70–99)

## 2019-03-31 LAB — BASIC METABOLIC PANEL
Anion gap: 14 (ref 5–15)
BUN: 10 mg/dL (ref 6–20)
CO2: 30 mmol/L (ref 22–32)
Calcium: 9.2 mg/dL (ref 8.9–10.3)
Chloride: 99 mmol/L (ref 98–111)
Creatinine, Ser: 0.76 mg/dL (ref 0.44–1.00)
GFR calc Af Amer: >60 mL/min (ref >60)
GFR calc non Af Amer: >60 mL/min (ref >60)
Glucose, Bld: 78 mg/dL (ref 70–99)
Potassium: 3.4 mmol/L — ABNORMAL LOW (ref 3.5–5.1)
Sodium: 143 mmol/L (ref 135–145)

## 2019-03-31 LAB — TRIGLYCERIDES: Triglycerides: 103 mg/dL (ref <150)

## 2019-03-31 LAB — MAGNESIUM: Magnesium: 1.9 mg/dL (ref 1.7–2.4)

## 2019-03-31 LAB — PHOSPHORUS: Phosphorus: 3.8 mg/dL (ref 2.5–4.6)

## 2019-03-31 MED ORDER — ARIPIPRAZOLE 5 MG PO TABS
5.0000 mg | ORAL_TABLET | Freq: Every day | ORAL | Status: DC
  Administered 2019-03-31 – 2019-04-03 (×4): 5 mg via ORAL
  Filled 2019-03-31 (×4): qty 1

## 2019-03-31 MED ORDER — POTASSIUM CHLORIDE CRYS ER 20 MEQ PO TBCR
40.0000 meq | EXTENDED_RELEASE_TABLET | Freq: Once | ORAL | Status: AC
  Administered 2019-03-31: 40 meq via ORAL
  Filled 2019-03-31: qty 2

## 2019-03-31 MED ORDER — FAMOTIDINE 40 MG/5ML PO SUSR
20.0000 mg | Freq: Two times a day (BID) | ORAL | Status: DC
  Administered 2019-03-31 – 2019-04-03 (×6): 20 mg via ORAL
  Filled 2019-03-31 (×7): qty 2.5

## 2019-03-31 MED ORDER — INSULIN ASPART 100 UNIT/ML ~~LOC~~ SOLN
0.0000 [IU] | Freq: Three times a day (TID) | SUBCUTANEOUS | Status: DC
  Administered 2019-03-31: 3 [IU] via SUBCUTANEOUS
  Administered 2019-03-31 – 2019-04-01 (×2): 2 [IU] via SUBCUTANEOUS
  Administered 2019-04-01 – 2019-04-02 (×2): 3 [IU] via SUBCUTANEOUS

## 2019-03-31 MED ORDER — IPRATROPIUM-ALBUTEROL 0.5-2.5 (3) MG/3ML IN SOLN
3.0000 mL | Freq: Two times a day (BID) | RESPIRATORY_TRACT | Status: DC
  Administered 2019-03-31 – 2019-04-03 (×6): 3 mL via RESPIRATORY_TRACT
  Filled 2019-03-31 (×6): qty 3

## 2019-03-31 MED ORDER — QUETIAPINE FUMARATE 25 MG PO TABS
100.0000 mg | ORAL_TABLET | Freq: Two times a day (BID) | ORAL | Status: DC
  Administered 2019-03-31 – 2019-04-03 (×7): 100 mg via ORAL
  Filled 2019-03-31: qty 2
  Filled 2019-03-31 (×6): qty 4

## 2019-03-31 NOTE — Progress Notes (Signed)
   Vital Signs MEWS/VS Documentation      03/31/2019 1646 03/31/2019 1700 03/31/2019 1820 03/31/2019 1841   MEWS Score:  _0 MEWS Score Color:  Yellow  Green  Green  Yellow   Resp:  (!) 30  (!) 25  -  (!) 24   Pulse:  98  92  -  (!) 50   BP:  -  (!) 128/59  -  135/63   Temp:  -  -  -  97.8 F (36.6 C)   O2 Device:  HFNC  -  -  Nasal Cannula   O2 Flow Rate (L/min):  10 L/min  -  -  9 L/min         No acute change in patients condition at this time  Rance Muir 03/31/2019,6:52 PM

## 2019-03-31 NOTE — Progress Notes (Signed)
Reason for consult: Seizure  Subjective: No further seizure-like activity overnight.  Has episodes of tachyarrhythmias, still requiring oxygen and unable to lay flat, therefore unable to complete MRI yesterday.  Denies ever having seizures before.  ROS: negative except above   Examination  Vital signs in last 24 hours: Temp:  [98 F (36.7 C)-99.2 F (37.3 C)] 98 F (36.7 C) (10/02 0817) Pulse Rate:  [48-100] 99 (10/02 0800) Resp:  [12-35] 13 (10/02 0800) BP: (121-153)/(59-82) 153/82 (10/02 0800) SpO2:  [88 %-98 %] 90 % (10/02 0802) Weight:  [99.4 kg] 99.4 kg (10/02 0429)  General: lying in bed, not in apparent distress CVS: pulse-normal rate and rhythm RS: breathing comfortably Extremities: normal, warm  Neuro: MS: Alert, oriented, follows commands, perseverates CN: pupils equal and reactive,  EOMI, face symmetric, tongue midline, normal sensation over face, Motor: 5/5 strength in all 4 extremities Reflexes: 1+ bilaterally over patella, biceps, plantars: flexor Coordination: normal Gait: not tested  Basic Metabolic Panel: Recent Labs  Lab 03/29/19 1804 03/29/19 08/11/33  03/30/19 0242 03/30/19 0407 03/30/19 0550 03/30/19 2317 03/31/19 0235  NA 138  --    < > 141 140 140 142 143  K 3.8  --    < > 4.3 2.8* 4.1 3.3* 3.4*  CL 94*  --   --  95*  --  94* 99 99  CO2 26  --   --  29  --  31 33* 30  GLUCOSE 160*  --   --  91  --  107* 85 78  BUN 9  --   --  12  --  _0 CREATININE 0.77  --   --  0.62  --  0.79 0.74 0.76  CALCIUM 9.7  --   --  9.6  --  9.6 9.2 9.2  MG  --  1.8  --  1.8  --   --  1.9 1.9  PHOS  --  4.2  --  3.4  --   --  3.8 3.8   < > = values in this interval not displayed.    CBC: Recent Labs  Lab 03/29/19 1804 03/29/19 2319 03/30/19 0242 03/30/19 0407 03/31/19 0235  WBC 10.4  --  11.9*  --  8.8  NEUTROABS 4.8  --   --   --  4.6  HGB 18.0* 20.7* 17.7* 20.4* 17.1*  HCT 63.9* 61.0* 61.8* 60.0* 59.8*  MCV 85.2  --  82.3  --  84.3  PLT 197  --   171  --  152     Coagulation Studies: Recent Labs    03/29/19 August 11, 2326  LABPROT 13.3  INR 1.0    Imaging CT head 03/29/2019: No acute abnormalities.  Questionable subtle nonspecific asymmetric hypodensity in left cerebellum.  Recommend MRI brain for further evaluation.  Chronic left PCA territory infarct and chronic lacunar infarcts in bilateral basal ganglia, right cerebellum.  CTA head and neck: No large vessel occlusion.  Questionable nonocclusive weblike stenosis in distal M1 left MCA  MRI brain ordered and pending  ASSESSMENT AND PLAN 53 year old female who presented with new onset seizures and encephalopathy in the setting of cocaine use.   Encephalopathy Seizures Cocaine use -Likely etiology for new onset seizures includes cocaine use versus PRES vs RCVS  Recommendations -MRI brain without contrast to look for any acute abnormality pending -Recommend SBP less than 140 -Continue Keppra 1 g twice daily.  Will decide if patient needs to continue this long-term pending MRI  results -Continue seizure precautions PRN IV Ativan 2 mg for generalized tonic-clonic seizure lasting more than 2 minutes.   CRITICAL CARE Performed by: Lora Havens   Total critical care time: 35 minutes  Critical care time was exclusive of separately billable procedures and treating other patients.  Critical care was necessary to treat or prevent imminent or life-threatening deterioration.  Critical care was time spent personally by me on the following activities: development of treatment plan with patient and/or surrogate as well as nursing, discussions with consultants, evaluation of patient's response to treatment, examination of patient, obtaining history from patient or surrogate, ordering and performing treatments and interventions, ordering and review of laboratory studies, ordering and review of radiographic studies, pulse oximetry and re-evaluation of patient's condition.

## 2019-03-31 NOTE — Progress Notes (Addendum)
NAME:  Diane Gilbert, MRN:  [id number], DOB:  Dec 13, 1965, LOS: 2 ADMISSION DATE:  03/29/2019, CONSULTATION DATE:  9/30 REFERRING MD:  Dr Billy Fischer, CHIEF COMPLAINT:  Status epilepticus  Brief History:   53 year old female with multiple chronic medical issues was found down and unresponsive 9/30. Grand mal seizure en route to ED. Intubated for airway protection after not returning to baseline mental status.   History of present illness  Patient is encephalopathic and/or intubated. Therefore history has been obtained from chart review.  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 She is also a current everyday smoker. She was recently admitted twice to [facility]. Once for running out of oxygen and once for confusion (her O2 tubing was kinked and SpO2 50%). She was also found to have subacute/chronic PCA infarct. Mental status improved with supplemental O2 and she was recommended to discharge to SNF, which she refused, and was discharged to home 9/13.  Then 9/30 was found down at home in the early evening hours. Last known well 1030 am that morning. EMS was called and she was transported to ED, which was complicated by a convulsive seizure en route, which was improved with versed. Upon arrival to ER she had R hemiparesis and R gaze deviation. She was also profoundly hypertensive. Code stroke was activated. Neuroimaging was negative. After CT she remained unresponsive and was intubated for airway protection. She was loaded with Keppra and started on EEG in ED. PCCM asked to admit. Notably, her UDS was positive for cocaine.   Past Medical History   has a past medical history of Acute on chronic respiratory failure with hypoxia ([place]) (06/07/2014), Acute respiratory failure with hypoxia ([place]) (03/23/2017), Alcohol abuse, Anxiety, Arthritis, CHF (congestive heart failure) ([place]), Community  acquired pneumonia, Depression, Headache(784.0), Hypertension, Insomnia, Interstitial lung disease ([place]), Oxygen dependent, Respiratory failure, acute and chronic ([place]) (05/09/2017), Schizophrenia ([place]), Shortness of breath dyspnea, Sleep apnea, and Sleep apnea.  Significant Hospital Events   9/30 admit, intubated for status epilepticus  Consults:  Neurology  Procedures:  ETT 9/30 >  Significant Diagnostic Tests:  EEG 9/30 >  Micro Data:  03/30/2019 sputum>> 03/30/2019 blood cultures x2>>  Antimicrobials:  Cefepime 9/30>> 03/29/2019 Flagyl 9/30>> 03/29/2019   Interim history/subjective:  Now extubated no acute distress.  Currently in sinus rhythm sinus tach with no ectopy.  Objective   Blood pressure (!) 153/82, pulse 85, temperature 98 F (36.7 C), temperature source Axillary, resp. rate (!) 22, height _0  (1.626 m), weight 99.4 kg, last menstrual period 04/06/2017, SpO2 90 %.        Intake/Output Summary (Last 24 hours) at 03/31/2019 0944 Last data filed at 03/31/2019 0900 Gross per 24 hour  Intake 1120.46 ml  Output 600 ml  Net 520.46 ml   Filed Weights   03/30/19 0215 03/31/19 0429  Weight: 95.4 kg 99.4 kg    Examination: General: Awake alert follows commands HEENT: Extubated no JVD or lymphadenopathy is appreciated Neuro: Somewhat dull effect but otherwise intact CV: Heart sounds are regular regular rhythm currently in a sinus rhythm PULM: Creased breath sounds GI: soft, bsx4 active obese Extremities: warm/dry, 1+ edema  Skin: no rashes or lesions    Resolved Hospital Problem list     Assessment & Plan:   Status epilepticus: no history of seizure. ddx includes recent stroke and cocaine intoxication  Neurology is following  Appreciate their input Continue Keppra for now  Acute on chronic hypoxemic respiratory failure Bronchiolitis interstitial lung disease COPD without acute exacerbation Possible aspiration noted on CT neck.  Extubated  03/30/2019 she is O2 dependent at home continue oxygen Pulmonary toilet Transfer to progressive unit and to [facility]   Hypertensive emergency: seems acute in the setting of cocaine. Bp was 152/100 in pulmonary clinic 9/29. Resolved now she is off cocaine   Frequent arrhythmia with episodes of bigeminy.  To be secondary to medication from cocaine. Replete electrolytes Twelve-lead EKG showed bigeminy Troponin unremarkable Continue to avoid beta blockers at this time May need cardiology consult for discharge  OSA: Noncompliant with CPAP Encouraged her to use CPAP in the future  Substance abuse: Cocaine positive on UDS Suggested counseling although she has very poor insight   Schizophrenia We will resume Abilify and Seroquel at this time   Best practice:  Diet: Advance to low carbohydrate Pain/Anxiety/Delirium protocol (if indicated): Not indicated VAP protocol (if indicated): Yes DVT prophylaxis: SQ heparin GI prophylaxis:  H2  Glucose control: Na Mobility: BR Code Status: FULL Family Communication: 03/31/2019 patient updated at bedside Disposition: 10/2 tx to [facility] and to [facility]. Dr. Marylyn Ishihara aware  Labs   CBC: Recent Labs  Lab 03/29/19 1804 03/29/19 2319 03/30/19 0242 03/30/19 0407 03/31/19 0235  WBC 10.4  --  11.9*  --  8.8  NEUTROABS 4.8  --   --   --  4.6  HGB 18.0* 20.7* 17.7* 20.4* 17.1*  HCT 63.9* 61.0* 61.8* 60.0* 59.8*  MCV 85.2  --  82.3  --  84.3  PLT 197  --  171  --  416    Basic Metabolic Panel: Recent Labs  Lab 03/29/19 1804 03/29/19 2135  03/30/19 0242 03/30/19 0407 03/30/19 0550 03/30/19 2317 03/31/19 0235  NA 138  --    < > 141 140 140 142 143  K 3.8  --    < > 4.3 2.8* 4.1 3.3* 3.4*  CL 94*  --   --  95*  --  94* 99 99  CO2 26  --   --  29  --  31 33* 30  GLUCOSE 160*  --   --  91  --  107* 85 78  BUN 9  --   --  12  --  _0 CREATININE 0.77  --   --  0.62  --  0.79 0.74 0.76  CALCIUM 9.7  --   --  9.6  --  9.6  9.2 9.2  MG  --  1.8  --  1.8  --   --  1.9 1.9  PHOS  --  4.2  --  3.4  --   --  3.8 3.8   < > = values in this interval not displayed.   GFR: Estimated Creatinine Clearance: 96.4 mL/min (by C-G formula based on SCr of 0.76 mg/dL). Recent Labs  Lab 03/29/19 1804 03/29/19 1808 03/29/19 2135 03/30/19 0242 03/31/19 0235  WBC 10.4  --   --  11.9* 8.8  LATICACIDVEN  --  7.3* 2.7*  --   --     Liver Function Tests: Recent Labs  Lab 03/29/19 1804  AST 29  ALT 27  ALKPHOS 70  BILITOT 0.8  PROT 8.6*  ALBUMIN 4.4   No results for input(s): LIPASE, AMYLASE in the last 168 hours. No results for input(s): AMMONIA in the last 168 hours.  ABG  Component Value Date/Time   PHART 7.422 03/30/2019 0407   PCO2ART 57.0 (H) 03/30/2019 0407   PO2ART 68.0 (L) 03/30/2019 0407   HCO3 37.1 (H) 03/30/2019 0407   TCO2 39 (H) 03/30/2019 0407   O2SAT 93.0 03/30/2019 0407     Coagulation Profile: Recent Labs  Lab 03/29/19 2328  INR 1.0    Cardiac Enzymes: No results for input(s): CKTOTAL, CKMB, CKMBINDEX, TROPONINI in the last 168 hours.  HbA1C: Hgb A1c MFr Bld  Date/Time Value Ref Range Status  03/30/2019 02:42 AM 6.0 (H) 4.8 - 5.6 % Final    Comment:    (NOTE) Pre diabetes:          5.7%-6.4% Diabetes:              >6.4% Glycemic control for   <7.0% adults with diabetes   07/04/2016 05:58 PM 6.2 (H) 4.8 - 5.6 % Final    Comment:    (NOTE)         Pre-diabetes: 5.7 - 6.4         Diabetes: >6.4         Glycemic control for adults with diabetes: <7.0     CBG: Recent Labs  Lab 03/30/19 1506 03/30/19 1946 03/31/19 0004 03/31/19 0424 03/31/19 0815  GLUCAP 95 94 80 83 82      Critical care time: 36 mins   Steve Melody Cirrincione ACNP Maryanna Shape PCCM Pager [phone] till 1 pm If no answer page [phone] 03/31/2019, 9:44 AM

## 2019-03-31 NOTE — Progress Notes (Signed)
CSW met with patient at bedside to complete discussion regarding her current substance use. Patient stated that she has only used cocaine once, and that she will not do it again. Patient reports she does not use any other substances or alcohol. Patient reports she smokes cigarettes only. Patient was unable to explain why her urine was also positive for benzodiazapine's. Patient reports living alone and feeling safe at home. Patient did not seem interested in speaking with CSW but responded to all questions, she did not make eye contact with CSW during interaction. Patient's lunch arrived so CSW ended discussion and offered resources that the patient denied. CSW encouraged patient to reach out for further discussion if she desired, she shook her head yes.   Madilyn Fireman, MSW, LCSW-A Clinical Social Worker   [facility]   Medical ICU [phone]

## 2019-04-01 LAB — CULTURE, RESPIRATORY W GRAM STAIN
Culture: NORMAL
Special Requests: NORMAL

## 2019-04-01 LAB — BASIC METABOLIC PANEL
Anion gap: 8 (ref 5–15)
BUN: 9 mg/dL (ref 6–20)
CO2: 31 mmol/L (ref 22–32)
Calcium: 8.8 mg/dL — ABNORMAL LOW (ref 8.9–10.3)
Chloride: 102 mmol/L (ref 98–111)
Creatinine, Ser: 0.59 mg/dL (ref 0.44–1.00)
GFR calc Af Amer: >60 mL/min (ref >60)
GFR calc non Af Amer: >60 mL/min (ref >60)
Glucose, Bld: 152 mg/dL — ABNORMAL HIGH (ref 70–99)
Potassium: 3.8 mmol/L (ref 3.5–5.1)
Sodium: 141 mmol/L (ref 135–145)

## 2019-04-01 LAB — CBC
HCT: 56.4 % — ABNORMAL HIGH (ref 36.0–46.0)
Hemoglobin: 15.8 g/dL — ABNORMAL HIGH (ref 12.0–15.0)
MCH: 23.7 pg — ABNORMAL LOW (ref 26.0–34.0)
MCHC: 28 g/dL — ABNORMAL LOW (ref 30.0–36.0)
MCV: 84.6 fL (ref 80.0–100.0)
Platelets: 149 10*3/uL — ABNORMAL LOW (ref 150–400)
RBC: 6.67 MIL/uL — ABNORMAL HIGH (ref 3.87–5.11)
RDW: 19.5 % — ABNORMAL HIGH (ref 11.5–15.5)
WBC: 5.7 10*3/uL (ref 4.0–10.5)
nRBC: 0 % (ref 0.0–0.2)

## 2019-04-01 LAB — GLUCOSE, CAPILLARY
Glucose-Capillary: 102 mg/dL — ABNORMAL HIGH (ref 70–99)
Glucose-Capillary: 140 mg/dL — ABNORMAL HIGH (ref 70–99)
Glucose-Capillary: 167 mg/dL — ABNORMAL HIGH (ref 70–99)
Glucose-Capillary: 135 mg/dL — ABNORMAL HIGH (ref 70–99)

## 2019-04-01 LAB — MAGNESIUM: Magnesium: 1.8 mg/dL (ref 1.7–2.4)

## 2019-04-01 LAB — PROTIME-INR
INR: 1 (ref 0.8–1.2)
Prothrombin Time: 13.3 seconds (ref 11.4–15.2)

## 2019-04-01 MED ORDER — MAGNESIUM SULFATE 2 GM/50ML IV SOLN
2.0000 g | Freq: Once | INTRAVENOUS | Status: AC
  Administered 2019-04-01: 2 g via INTRAVENOUS
  Filled 2019-04-01: qty 50

## 2019-04-01 MED ORDER — ALBUTEROL SULFATE (2.5 MG/3ML) 0.083% IN NEBU
2.5000 mg | INHALATION_SOLUTION | RESPIRATORY_TRACT | Status: DC | PRN
  Administered 2019-04-01: 14:00:00 2.5 mg via RESPIRATORY_TRACT
  Filled 2019-04-01: qty 3

## 2019-04-01 MED ORDER — POTASSIUM CHLORIDE CRYS ER 20 MEQ PO TBCR
20.0000 meq | EXTENDED_RELEASE_TABLET | Freq: Once | ORAL | Status: AC
  Administered 2019-04-01: 14:00:00 20 meq via ORAL
  Filled 2019-04-01: qty 1

## 2019-04-01 NOTE — Progress Notes (Signed)
PROGRESS NOTE    Diane Gilbert  ZOX:[id number] DOB: [birth year] DOA: 03/29/2019 PCP: Nolene Ebbs, MD   Brief Narrative: This is a 53 year old female with past medical history of PCA stroke, RB-ILD, OSA not on CPAP, schizophrenia who was found down at home with convulsive seizure developing in route and treated with Versed.  Right hemiparesis and gaze deviation noted in the ED.  She was admitted on 9/30 and patient was intubated in the ED for airway protection.  UDS was positive for cocaine.  Chest x-ray with right lower lobe atelectasis.  Patient was admitted for status epilepticus, hypertensive emergency and PR ES and started on Keppra.  Neurology and critical care were consulted.  Patient was successfully extubated on 10/1.  throughout hospital stay patient has had asymptomatic ventricular bigeminy and frequent PVCs.  She was deemed medically stable and was transferred to 5W from ICU on 10/3.   Assessment & Plan:   Active Problems:   Status epilepticus ([place])   1. Status epilepticus without prior history of seizure -suspected cocaine intoxication.  Currently on Keppra.  Has been unable to get MRI brain for complete seizure work-up due to inability to lie flat. 1. Continue following with neurology 2. Continue Keppra 2. Cocaine use -advised cocaine cessation refrain from illicit drug use 3. Frequent bigeminy and PVCs 1. Goal K greater than 4.0 2. Goal Mg greater than 2.0 3. Consider holding Abilify 4. Acute on chronic hypoxemic respiratory failure status post successful extubation 10/3 -concern for possible aspiration on CT neck.  Patient is O2 dependent on home with 4 L nasal cannula at baseline, on 5 L this afternoon 1. Pulmonary toilet 2. Continue to titrate oxygen as appropriate 3. PCCM consulted 5. Hypertensive emergency in setting of cocaine use -BP 152/100 in pulmonary clinic prior to arrival 6. 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 MRI brain ordered 2. Follow with neurology 7. Schizophrenia -on Abilify and Seroquel 8. 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 Continue nebulizers 2. PCCM consulted 9. Diabetes -on sliding scale    DVT prophylaxis: Heparin  Code Status: Full code Disposition Plan: Pending medical stability   Consultants:   Neuro, PCCM   Procedures: EEG   Antimicrobials: Received metronidazole, currently off antibiotics   Subjective: Patient seen and examined at bedside in no acute distress and resting comfortably.  She denies any acute complaints at this time.  Denies any chest pain, palpitations, nausea, shortness of breath.  Was unsure why she was in the hospital and was surprised to learn that she had a seizure.  She question why she had a seizure and was informed that it was likely from the cocaine.  Objective: Vitals:   04/01/19 1000 04/01/19 1127 04/01/19 1302 04/01/19 1635  BP:  (!) 150/52  (!) 150/85  Pulse:  96  82  Resp: 20 16  (!) 22  Temp:  98.3 F (36.8 C)  97.9 F (36.6 C)  TempSrc:  Oral    SpO2: 95% 98% 96% 93%  Weight:      Height:        Intake/Output Summary (Last 24 hours) at 04/01/2019 1718 Last data filed at 04/01/2019 1526 Gross per 24 hour  Intake 250 ml  Output 600 ml  Net -350 ml   Filed Weights   03/30/19 0215 03/31/19 0429 04/01/19 0449  Weight: 95.4 kg 99.4 kg 107.5 kg    Examination:  General exam: Appears calm and comfortable  Respiratory system: Clear to auscultation. Respiratory effort normal. Cardiovascular system: S1 & S2 heard, RRR. No JVD, murmurs, rubs, gallops or clicks. No pedal edema. Gastrointestinal system: Abdomen is nondistended, soft and nontender. No organomegaly or masses felt. Normal bowel sounds heard. Central nervous system: Alert, slowed mentation but answers questions appropriately. No focal neurological deficits. Extremities: No lower  extremity edema. Skin: No rashes, lesions or ulcers Psychiatry: Judgement and insight appear normal. Mood & affect appropriate.     Data Reviewed: I have personally reviewed following labs and imaging studies  CBC: Recent Labs  Lab 03/29/19 1804 03/29/19 2319 03/30/19 0242 03/30/19 0407 03/31/19 0235 04/01/19 1203  WBC 10.4  --  11.9*  --  8.8 5.7  NEUTROABS 4.8  --   --   --  4.6  --   HGB 18.0* 20.7* 17.7* 20.4* 17.1* 15.8*  HCT 63.9* 61.0* 61.8* 60.0* 59.8* 56.4*  MCV 85.2  --  82.3  --  84.3 84.6  PLT 197  --  171  --  152 888*   Basic Metabolic Panel: Recent Labs  Lab 03/29/19 2135  03/30/19 0242 03/30/19 0407 03/30/19 0550 03/30/19 2317 03/31/19 0235 04/01/19 1203  NA  --    < > 141 140 140 142 143 141  K  --    < > 4.3 2.8* 4.1 3.3* 3.4* 3.8  CL  --   --  95*  --  94* 99 99 102  CO2  --   --  29  --  31 33* 30 31  GLUCOSE  --   --  91  --  107* 85 78 152*  BUN  --   --  12  --  _0 CREATININE  --   --  0.62  --  0.79 0.74 0.76 0.59  CALCIUM  --   --  9.6  --  9.6 9.2 9.2 8.8*  MG 1.8  --  1.8  --   --  1.9 1.9 1.8  PHOS 4.2  --  3.4  --   --  3.8 3.8  --    < > = values in this interval not displayed.   GFR: Estimated Creatinine Clearance: 100.7 mL/min (by C-G formula based on SCr of 0.59 mg/dL). Liver Function Tests: Recent Labs  Lab 03/29/19 1804  AST 29  ALT 27  ALKPHOS 70  BILITOT 0.8  PROT 8.6*  ALBUMIN 4.4   No results for input(s): LIPASE, AMYLASE in the last 168 hours. No results for input(s): AMMONIA in the last 168 hours. Coagulation Profile: Recent Labs  Lab 03/29/19 2328  INR 1.0   Cardiac Enzymes: No results for input(s): CKTOTAL, CKMB, CKMBINDEX, TROPONINI in the last 168 hours. BNP (last 3 results) No results for input(s): PROBNP in the last 8760 hours. HbA1C: Recent Labs    03/30/19 0242  HGBA1C 6.0*   CBG: Recent Labs  Lab 03/31/19 1624 03/31/19 2111 04/01/19 0734 04/01/19 1139 04/01/19 1640  GLUCAP  144* 159* 102* 140* 167*   Lipid Profile: Recent Labs    03/30/19 0242 03/31/19 0235  TRIG 184* 103   Thyroid Function Tests: Recent Labs    03/29/19 2135  TSH 0.615  FREET4 0.98   Anemia Panel: No results for input(s): VITAMINB12, FOLATE, FERRITIN, TIBC, IRON, RETICCTPCT in the last 72 hours. Sepsis Labs: Recent Labs  Lab 03/29/19 1808 03/29/19 2135  LATICACIDVEN 7.3* 2.7*    Recent Results (from  the past 240 hour(s))  SARS Coronavirus 2 [facility] order, Performed in [facility] hospital lab) Nasopharyngeal Nasopharyngeal Swab     Status: None   Collection Time: 03/29/19  7:03 PM   Specimen: Nasopharyngeal Swab  Result Value Ref Range Status   SARS Coronavirus 2 NEGATIVE NEGATIVE Final    Comment: (NOTE) If result is NEGATIVE SARS-CoV-2 target nucleic acids are NOT DETECTED. The SARS-CoV-2 RNA is generally detectable in upper and lower  respiratory specimens during the acute phase of infection. The lowest  concentration of SARS-CoV-2 viral copies this assay can detect is 250  copies / mL. A negative result does not preclude SARS-CoV-2 infection  and should not be used as the sole basis for treatment or other  patient management decisions.  A negative result may occur with  improper specimen collection / handling, submission of specimen other  than nasopharyngeal swab, presence of viral mutation(s) within the  areas targeted by this assay, and inadequate number of viral copies  (<250 copies / mL). A negative result must be combined with clinical  observations, patient history, and epidemiological information. If result is POSITIVE SARS-CoV-2 target nucleic acids are DETECTED. The SARS-CoV-2 RNA is generally detectable in upper and lower  respiratory specimens dur ing the acute phase of infection.  Positive  results are indicative of active infection with SARS-CoV-2.  Clinical  correlation with patient history and other diagnostic information is  necessary to  determine patient infection status.  Positive results do  not rule out bacterial infection or co-infection with other viruses. If result is PRESUMPTIVE POSTIVE SARS-CoV-2 nucleic acids MAY BE PRESENT.   A presumptive positive result was obtained on the submitted specimen  and confirmed on repeat testing.  While 2019 novel coronavirus  (SARS-CoV-2) nucleic acids may be present in the submitted sample  additional confirmatory testing may be necessary for epidemiological  and / or clinical management purposes  to differentiate between  SARS-CoV-2 and other Sarbecovirus currently known to infect humans.  If clinically indicated additional testing with an alternate test  methodology [phone]) is advised. The SARS-CoV-2 RNA is generally  detectable in upper and lower respiratory sp ecimens during the acute  phase of infection. The expected result is Negative. Fact Sheet for Patients:  StrictlyIdeas.no Fact Sheet for Healthcare Providers: BankingDealers.co.za This test is not yet approved or cleared by the [place] FDA and has been authorized for detection and/or diagnosis of SARS-CoV-2 by FDA under an Emergency Use Authorization (EUA).  This EUA will remain in effect (meaning this test can be used) for the duration of the COVID-19 declaration under Section 564(b)(1) of the Act, 21 U.S.C. section 360bbb-3(b)(1), unless the authorization is terminated or revoked sooner. Performed at [facility], [place] [address], [place], [place] [address]   MRSA PCR Screening     Status: None   Collection Time: 03/30/19  2:14 AM   Specimen: Nasopharyngeal  Result Value Ref Range Status   MRSA by PCR NEGATIVE NEGATIVE Final    Comment:        The GeneXpert MRSA Assay (FDA approved for NASAL specimens only), is one component of a comprehensive MRSA colonization surveillance program. It is not intended to diagnose MRSA infection nor to guide or  monitor treatment for MRSA infections. Performed at [facility], [place] [address] [place], [place] [address]   Culture, blood (Routine X 2) w Reflex to ID Panel     Status: None (Preliminary result)   Collection Time: 03/30/19  10:38 AM   Specimen: BLOOD  Result Value Ref Range Status   Specimen Description BLOOD BLOOD LEFT HAND  Final   Special Requests   Final    BOTTLES DRAWN AEROBIC ONLY Blood Culture adequate volume   Culture   Final    NO GROWTH 2 DAYS Performed at [facility], [address], [place], [place] [address]    Report Status PENDING  Incomplete  Culture, blood (Routine X 2) w Reflex to ID Panel     Status: None (Preliminary result)   Collection Time: 03/30/19 10:48 AM   Specimen: BLOOD  Result Value Ref Range Status   Specimen Description BLOOD BLOOD RIGHT HAND  Final   Special Requests   Final    BOTTLES DRAWN AEROBIC ONLY Blood Culture adequate volume   Culture   Final    NO GROWTH 2 DAYS Performed at [facility], [place] [address], [place], [place] [address]    Report Status PENDING  Incomplete  Culture, respiratory (non-expectorated)     Status: None   Collection Time: 03/30/19  3:13 PM   Specimen: Tracheal Aspirate; Respiratory  Result Value Ref Range Status   Specimen Description TRACHEAL ASPIRATE  Final   Special Requests Normal  Final   Gram Stain   Final    RARE WBC PRESENT, PREDOMINANTLY PMN ABUNDANT SQUAMOUS EPITHELIAL CELLS PRESENT MODERATE GRAM NEGATIVE RODS MODERATE GRAM POSITIVE COCCI FEW GRAM POSITIVE RODS    Culture   Final    ABUNDANT Consistent with normal respiratory flora. Performed at [facility], [place] [address], [place], [place] [address]    Report Status 04/01/2019 FINAL  Final         Radiology Studies: Dg Chest Port 1 View  Result Date: 03/31/2019 CLINICAL DATA:  Respiratory failure. EXAM: PORTABLE CHEST 1 VIEW COMPARISON:  03/29/2019 FINDINGS: The patient has been extubated. The heart size is  enlarged. There are prominent interstitial lung markings bilaterally with probable small bilateral pleural effusions and adjacent atelectasis. The right lung base is not fully visualized on this exam. There is no pneumothorax. IMPRESSION: Cardiomegaly with findings concerning for pulmonary edema. There are likely small bilateral pleural effusions. Electronically Signed   By: Constance Holster M.D.   On: 03/31/2019 08:12        Scheduled Meds: . ARIPiprazole  5 mg Oral Daily  . budesonide (PULMICORT) nebulizer solution  0.5 mg Nebulization BID  . chlorhexidine gluconate (MEDLINE KIT)  15 mL Mouth Rinse BID  . Chlorhexidine Gluconate Cloth  6 each Topical Daily  . famotidine  20 mg Oral BID  . heparin  5,000 Units Subcutaneous Q8H  . influenza vac split quadrivalent PF  0.5 mL Intramuscular Tomorrow-1000  . insulin aspart  0-15 Units Subcutaneous TID WC  . ipratropium-albuterol  3 mL Nebulization BID  . mouth rinse  15 mL Mouth Rinse BID  . QUEtiapine  100 mg Oral BID   Continuous Infusions: . sodium chloride 10 mL/hr at 03/31/19 1021  . levETIRAcetam 1,000 mg (04/01/19 1000)     LOS: 3 days    Time spent: 34 mins    Harold Hedge, MD [facility]   If 7PM-7AM, please contact night-coverage [email or web address] Password [email or web address] 04/01/2019, 5:18 PM

## 2019-04-01 NOTE — Progress Notes (Signed)
Upon assessment, patient has an expiratory wheeze. Dr. Neysa Bonito paged to notify and PRN albuterol ordered. Will administer and continue to monitor.

## 2019-04-01 NOTE — Progress Notes (Signed)
Subjective: Feels like she is back to normal, improving ability to lay flat  Exam: Vitals:   04/01/19 1302 04/01/19 1635  BP:  (!) 150/85  Pulse:  82  Resp:  (!) 22  Temp:  97.9 F (36.6 C)  SpO2: 96% 93%   Gen: In bed, NAD Resp: non-labored breathing, no acute distress Abd: soft, nt  Neuro: MS: Awake, alert, interactive and appropriate CN: Pupils equal round and reactive, extraocular movements intact, visual fields full Motor: Moves all extremities well Sensory: Intact light touch   Impression: 53 year old who presented new onset status epilepticus and encephalopathy in the setting of cocaine use.  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  She has not been able to get an MRI due to inability to lie flat, but I still think it would be helpful to decide on long-term antiepileptic use.  Recommendations: 1) continue Keppra 1 g every 12 hours 2) MRI brain when able  Roland Rack, MD [facility] [phone]  If 7pm- 7am, please page neurology on call as listed in [place].

## 2019-04-01 NOTE — Progress Notes (Signed)
Received call from centralized telemetry stating patient's HR was in the 150's. Upon assessment, patient was resting. Denies any complaints. BP 150/52, HR 96 and 98% on 7L HFNC. Discussing patient with telemetry tech and she stating she switched her leads around and her heart rate above was inaccurate. Patient's rate in the 90's with frequent PVC's and ventricular bigeminy. Dr. Neysa Bonito paged to make aware of situation. Will continue to monitor.  Hiram Comber, RN 04/01/2019 11:32 AM

## 2019-04-02 ENCOUNTER — Inpatient Hospital Stay (HOSPITAL_COMMUNITY): Payer: Medicaid Other

## 2019-04-02 LAB — CBC
HCT: 56.4 % — ABNORMAL HIGH (ref 36.0–46.0)
Hemoglobin: 16.2 g/dL — ABNORMAL HIGH (ref 12.0–15.0)
MCH: 24.3 pg — ABNORMAL LOW (ref 26.0–34.0)
MCHC: 28.7 g/dL — ABNORMAL LOW (ref 30.0–36.0)
MCV: 84.4 fL (ref 80.0–100.0)
Platelets: 155 10*3/uL (ref 150–400)
RBC: 6.68 MIL/uL — ABNORMAL HIGH (ref 3.87–5.11)
RDW: 19.5 % — ABNORMAL HIGH (ref 11.5–15.5)
WBC: 5.6 10*3/uL (ref 4.0–10.5)
nRBC: 0 % (ref 0.0–0.2)

## 2019-04-02 LAB — BASIC METABOLIC PANEL
Anion gap: 10 (ref 5–15)
BUN: 6 mg/dL (ref 6–20)
CO2: 29 mmol/L (ref 22–32)
Calcium: 8.8 mg/dL — ABNORMAL LOW (ref 8.9–10.3)
Chloride: 103 mmol/L (ref 98–111)
Creatinine, Ser: 0.6 mg/dL (ref 0.44–1.00)
GFR calc Af Amer: >60 mL/min (ref >60)
GFR calc non Af Amer: >60 mL/min (ref >60)
Glucose, Bld: 152 mg/dL — ABNORMAL HIGH (ref 70–99)
Potassium: 3.6 mmol/L (ref 3.5–5.1)
Sodium: 142 mmol/L (ref 135–145)

## 2019-04-02 LAB — GLUCOSE, CAPILLARY
Glucose-Capillary: 103 mg/dL — ABNORMAL HIGH (ref 70–99)
Glucose-Capillary: 168 mg/dL — ABNORMAL HIGH (ref 70–99)
Glucose-Capillary: 112 mg/dL — ABNORMAL HIGH (ref 70–99)
Glucose-Capillary: 115 mg/dL — ABNORMAL HIGH (ref 70–99)

## 2019-04-02 LAB — MAGNESIUM: Magnesium: 2.1 mg/dL (ref 1.7–2.4)

## 2019-04-02 MED ORDER — LEVETIRACETAM 500 MG PO TABS
1000.0000 mg | ORAL_TABLET | Freq: Two times a day (BID) | ORAL | Status: DC
  Administered 2019-04-02 – 2019-04-03 (×3): 1000 mg via ORAL
  Filled 2019-04-02 (×3): qty 2

## 2019-04-02 NOTE — Progress Notes (Signed)
Talked to the technician at the MRI department to redo MRI this shift per order. I was told by York Cerise  that they have  lots of stat orders and that should be done first and will probably be done in the morning on day shift.

## 2019-04-02 NOTE — Progress Notes (Signed)
PROGRESS NOTE    Diane Gilbert  GMW:[id number] DOB: May 18, 1966 DOA: 03/29/2019 PCP: Nolene Ebbs, MD   Brief Narrative: This is a 53 year old female with past medical history of PCA stroke, RB-ILD, OSA not on CPAP, schizophrenia who was found down at home with convulsive seizure developing in route and treated with Versed.  Right hemiparesis and gaze deviation noted in the ED.  She was admitted on 9/30 and patient was intubated in the ED for airway protection.  UDS was positive for cocaine.  Chest x-ray with right lower lobe atelectasis.  Patient was admitted for status epilepticus, hypertensive emergency and PR ES and started on Keppra.  Neurology and critical care were consulted.  Patient was successfully extubated on 10/1.  throughout hospital stay patient has had asymptomatic ventricular bigeminy and frequent PVCs.  She was deemed medically stable and was transferred to 5W from ICU on 10/3.   Assessment & Plan:   Active Problems:   Status epilepticus ([place])   1. Status epilepticus without prior history of seizure -suspected cocaine intoxication.  Currently on Keppra.  Has been unable to get MRI brain for complete seizure work-up due to inability to lie flat. 1. Per neurology: Continue Keppra 1 g every 12 hours. 2. Patient to go for MRI brain in a.m. 2. Cocaine use -advised cocaine cessation refrain from illicit drug use 3. Frequent bigeminy and PVCs 1. Goal K greater than 4.0 2. Goal Mg greater than 2.0 3. Consider holding Abilify 4. Acute on chronic hypoxemic respiratory failure status post successful extubation 10/3 -concern for possible aspiration on CT neck.  Patient is O2 dependent on home with 4 L nasal cannula at baseline.  Has been titrated down to baseline 1. Pulmonary toilet 2. Continue to titrate oxygen as appropriate 3. PCCM consulted 5. Hypertensive emergency in setting of cocaine use -BP 152/100 in pulmonary clinic prior to arrival 6. Concern for posterior  reversible encephalopathy syndrome-unable to be confirmed as patient has not been able to get an MRI as above 1. MRI brain ordered 2. Follow with neurology 7. Schizophrenia -on Abilify and Seroquel 8. 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 Continue nebulizers 2. PCCM consulted 9. Diabetes -on sliding scale    DVT prophylaxis: Heparin  Code Status: Full code Disposition Plan: Patient will likely be medically stable for discharge tomorrow once she has her MRI in the morning.  Has home health arranged.   Consultants:   Neuro, PCCM   Procedures: EEG   Antimicrobials: Received metronidazole, currently off antibiotics   Subjective: Today patient mentioned that her mother passed away several days ago and that she is hoping to go to [place] at some point to visit her family.  Otherwise she has no complaints at this time.  Objective: Vitals:   04/02/19 1130 04/02/19 1630 04/02/19 2029 04/02/19 2100  BP: 107/85 (!) 155/94  (!) 147/69  Pulse: 91 79 74 68  Resp:  18 16   Temp: 98.7 F (37.1 C) 98.6 F (37 C)  97.7 F (36.5 C)  TempSrc: Oral Oral  Oral  SpO2: 95% 94% 95% 95%  Weight:      Height:       No intake or output data in the 24 hours ending 04/02/19 2158 Filed Weights   03/31/19 0429 04/01/19 0449 04/02/19 0546  Weight: 99.4 kg 107.5 kg 109.4 kg    Examination:  General exam: Appears calm and comfortable  Respiratory system: Clear to auscultation. Respiratory effort normal. Cardiovascular system: S1 & S2  heard, RRR. No JVD, murmurs, rubs, gallops or clicks. No pedal edema. Gastrointestinal system: Abdomen is nondistended, soft and nontender.   Central nervous system: Alert, slowed mentation but answers questions appropriately, at baseline. No focal neurological deficits. Extremities: No lower extremity edema. Skin: No rashes, lesions or ulcers Psychiatry: Judgement and insight appear normal. Mood & affect appropriate.     Data Reviewed: I  have personally reviewed following labs and imaging studies  CBC: Recent Labs  Lab 03/29/19 1804  03/30/19 0242 03/30/19 0407 03/31/19 0235 04/01/19 1203 04/02/19 0253  WBC 10.4  --  11.9*  --  8.8 5.7 5.6  NEUTROABS 4.8  --   --   --  4.6  --   --   HGB 18.0*   < > 17.7* 20.4* 17.1* 15.8* 16.2*  HCT 63.9*   < > 61.8* 60.0* 59.8* 56.4* 56.4*  MCV 85.2  --  82.3  --  84.3 84.6 84.4  PLT 197  --  171  --  152 149* 155   < > = values in this interval not displayed.   Basic Metabolic Panel: Recent Labs  Lab 03/29/19 2135  03/30/19 0242  03/30/19 0550 03/30/19 2317 03/31/19 0235 04/01/19 1203 04/02/19 0253  NA  --    < > 141   < > 140 142 143 141 142  K  --    < > 4.3   < > 4.1 3.3* 3.4* 3.8 3.6  CL  --   --  95*  --  94* 99 99 102 103  CO2  --   --  29  --  31 33* _0 GLUCOSE  --   --  91  --  107* 85 78 152* 152*  BUN  --   --  12  --  _1 CREATININE  --   --  0.62  --  0.79 0.74 0.76 0.59 0.60  CALCIUM  --   --  9.6  --  9.6 9.2 9.2 8.8* 8.8*  MG 1.8  --  1.8  --   --  1.9 1.9 1.8 2.1  PHOS 4.2  --  3.4  --   --  3.8 3.8  --   --    < > = values in this interval not displayed.   GFR: Estimated Creatinine Clearance: 101.7 mL/min (by C-G formula based on SCr of 0.6 mg/dL). Liver Function Tests: Recent Labs  Lab 03/29/19 1804  AST 29  ALT 27  ALKPHOS 70  BILITOT 0.8  PROT 8.6*  ALBUMIN 4.4   No results for input(s): LIPASE, AMYLASE in the last 168 hours. No results for input(s): AMMONIA in the last 168 hours. Coagulation Profile: Recent Labs  Lab 03/29/19 2328  INR 1.0   Cardiac Enzymes: No results for input(s): CKTOTAL, CKMB, CKMBINDEX, TROPONINI in the last 168 hours. BNP (last 3 results) No results for input(s): PROBNP in the last 8760 hours. HbA1C: No results for input(s): HGBA1C in the last 72 hours. CBG: Recent Labs  Lab 04/01/19 2054 04/02/19 0914 04/02/19 1129 04/02/19 1628 04/02/19 2101  GLUCAP 135* 103* 168* 112* 115*    Lipid Profile: Recent Labs    03/31/19 0235  TRIG 103   Thyroid Function Tests: No results for input(s): TSH, T4TOTAL, FREET4, T3FREE, THYROIDAB in the last 72 hours. Anemia Panel: No results for input(s): VITAMINB12, FOLATE, FERRITIN, TIBC, IRON, RETICCTPCT in the last 72 hours. Sepsis Labs: Recent Labs  Lab  03/29/19 1808 03/29/19 2135  LATICACIDVEN 7.3* 2.7*    Recent Results (from the past 240 hour(s))  SARS Coronavirus 2 [facility] order, Performed in [facility] hospital lab) Nasopharyngeal Nasopharyngeal Swab     Status: None   Collection Time: 03/29/19  7:03 PM   Specimen: Nasopharyngeal Swab  Result Value Ref Range Status   SARS Coronavirus 2 NEGATIVE NEGATIVE Final    Comment: (NOTE) If result is NEGATIVE SARS-CoV-2 target nucleic acids are NOT DETECTED. The SARS-CoV-2 RNA is generally detectable in upper and lower  respiratory specimens during the acute phase of infection. The lowest  concentration of SARS-CoV-2 viral copies this assay can detect is 250  copies / mL. A negative result does not preclude SARS-CoV-2 infection  and should not be used as the sole basis for treatment or other  patient management decisions.  A negative result may occur with  improper specimen collection / handling, submission of specimen other  than nasopharyngeal swab, presence of viral mutation(s) within the  areas targeted by this assay, and inadequate number of viral copies  (<250 copies / mL). A negative result must be combined with clinical  observations, patient history, and epidemiological information. If result is POSITIVE SARS-CoV-2 target nucleic acids are DETECTED. The SARS-CoV-2 RNA is generally detectable in upper and lower  respiratory specimens dur ing the acute phase of infection.  Positive  results are indicative of active infection with SARS-CoV-2.  Clinical  correlation with patient history and other diagnostic information is  necessary to determine patient  infection status.  Positive results do  not rule out bacterial infection or co-infection with other viruses. If result is PRESUMPTIVE POSTIVE SARS-CoV-2 nucleic acids MAY BE PRESENT.   A presumptive positive result was obtained on the submitted specimen  and confirmed on repeat testing.  While 2019 novel coronavirus  (SARS-CoV-2) nucleic acids may be present in the submitted sample  additional confirmatory testing may be necessary for epidemiological  and / or clinical management purposes  to differentiate between  SARS-CoV-2 and other Sarbecovirus currently known to infect humans.  If clinically indicated additional testing with an alternate test  methodology [phone]) is advised. The SARS-CoV-2 RNA is generally  detectable in upper and lower respiratory sp ecimens during the acute  phase of infection. The expected result is Negative. Fact Sheet for Patients:  StrictlyIdeas.no Fact Sheet for Healthcare Providers: BankingDealers.co.za This test is not yet approved or cleared by the [place] FDA and has been authorized for detection and/or diagnosis of SARS-CoV-2 by FDA under an Emergency Use Authorization (EUA).  This EUA will remain in effect (meaning this test can be used) for the duration of the COVID-19 declaration under Section 564(b)(1) of the Act, 21 U.S.C. section 360bbb-3(b)(1), unless the authorization is terminated or revoked sooner. Performed at [facility], [place] [address] [place], [place] [address]   MRSA PCR Screening     Status: None   Collection Time: 03/30/19  2:14 AM   Specimen: Nasopharyngeal  Result Value Ref Range Status   MRSA by PCR NEGATIVE NEGATIVE Final    Comment:        The GeneXpert MRSA Assay (FDA approved for NASAL specimens only), is one component of a comprehensive MRSA colonization surveillance program. It is not intended to diagnose MRSA infection nor to guide or monitor treatment  for MRSA infections. Performed at [facility], [place] [address], [place], [place] [address]   Culture, blood (Routine X 2) w Reflex to ID  Panel     Status: None (Preliminary result)   Collection Time: 03/30/19 10:38 AM   Specimen: BLOOD  Result Value Ref Range Status   Specimen Description BLOOD BLOOD LEFT HAND  Final   Special Requests   Final    BOTTLES DRAWN AEROBIC ONLY Blood Culture adequate volume   Culture   Final    NO GROWTH 3 DAYS Performed at [facility], [address], [place], [place] [address]    Report Status PENDING  Incomplete  Culture, blood (Routine X 2) w Reflex to ID Panel     Status: None (Preliminary result)   Collection Time: 03/30/19 10:48 AM   Specimen: BLOOD  Result Value Ref Range Status   Specimen Description BLOOD BLOOD RIGHT HAND  Final   Special Requests   Final    BOTTLES DRAWN AEROBIC ONLY Blood Culture adequate volume   Culture   Final    NO GROWTH 3 DAYS Performed at [facility], [place] [address], [place], [place] [address]    Report Status PENDING  Incomplete  Culture, respiratory (non-expectorated)     Status: None   Collection Time: 03/30/19  3:13 PM   Specimen: Tracheal Aspirate; Respiratory  Result Value Ref Range Status   Specimen Description TRACHEAL ASPIRATE  Final   Special Requests Normal  Final   Gram Stain   Final    RARE WBC PRESENT, PREDOMINANTLY PMN ABUNDANT SQUAMOUS EPITHELIAL CELLS PRESENT MODERATE GRAM NEGATIVE RODS MODERATE GRAM POSITIVE COCCI FEW GRAM POSITIVE RODS    Culture   Final    ABUNDANT Consistent with normal respiratory flora. Performed at [facility], [place] [address], [place],  [address]    Report Status 04/01/2019 FINAL  Final         Radiology Studies: Mr Brain Wo Contrast  Result Date: 04/02/2019 CLINICAL DATA:  53 year old female with recent altered mental status. New onset seizures. EXAM: MRI HEAD WITHOUT CONTRAST TECHNIQUE: Multiplanar, multiecho pulse  sequences of the brain and surrounding structures were obtained without intravenous contrast. COMPARISON:  CT head and CTA head and neck 03/29/2019. Brain MRI 03/08/2019. FINDINGS: Brain: Chronic encephalomalacia in the left occipital pole, bilateral globus pallidus. Small chronic infarcts in the right cerebellum. No restricted diffusion to suggest acute infarction. No midline shift, mass effect, evidence of mass lesion, ventriculomegaly, extra-axial collection or acute intracranial hemorrhage. Cervicomedullary junction and pituitary are within normal limits. Thin slice coronal images of the temporal lobes. No definite hippocampal asymmetry or signal abnormality. No cortical encephalomalacia identified outside of the left PCA territory. No definite chronic cerebral blood products. Widely scattered and patchy bilateral cerebral white matter T2 and FLAIR hyperintensity in a nonspecific configuration. Negative thalami and brainstem. Vascular: Major intracranial vascular flow voids are stable. Skull and upper cervical spine: Partially visible cervical spine degeneration including probable mild spinal stenosis at C3-C4 (series 10, image 13). Visualized bone marrow signal is within normal limits. Sinuses/Orbits: Disconjugate gaze but otherwise negative orbits. Chronic paranasal sinus disease, increased right sphenoid mucosal thickening since last month. Other: Mastoids remain clear. Visible internal auditory structures appear normal. Scalp and face soft tissues appear negative. IMPRESSION: 1. No acute intracranial abnormality. 2. Stable from last month: Chronic infarcts in the left PCA and right cerebellar artery territory. Encephalomalacia in the bilateral globus pallidus. Advanced but nonspecific cerebral white matter signal changes. 3. Partially visible cervical spine degeneration with mild spinal stenosis suspected at C3-C4. Electronically Signed   By: Herminio Heads.D.  On: 04/02/2019 08:58        Scheduled Meds:  . ARIPiprazole  5 mg Oral Daily  . budesonide (PULMICORT) nebulizer solution  0.5 mg Nebulization BID  . famotidine  20 mg Oral BID  . heparin  5,000 Units Subcutaneous Q8H  . influenza vac split quadrivalent PF  0.5 mL Intramuscular Tomorrow-1000  . insulin aspart  0-15 Units Subcutaneous TID WC  . ipratropium-albuterol  3 mL Nebulization BID  . levETIRAcetam  1,000 mg Oral BID  . QUEtiapine  100 mg Oral BID   Continuous Infusions: . sodium chloride 10 mL/hr at 03/31/19 1021     LOS: 4 days    Time spent: 35 mins    Harold Hedge, DO [facility]   If 7PM-7AM, please contact night-coverage [email or web address] Password [email or web address] 04/02/2019, 9:58 PM

## 2019-04-02 NOTE — Progress Notes (Signed)
Patient weaned off 5L HFNC to 4L [place]. Patient being transported down to MRI. Will continue to monitor.  Hiram Comber, RN 04/02/2019 8:01 AM

## 2019-04-02 NOTE — Progress Notes (Signed)
Subjective: [place] to be improved.   Exam: Vitals:   04/02/19 0916 04/02/19 1130  BP: (!) 144/86 107/85  Pulse: 79 91  Resp: 18   Temp: 98.7 F (37.1 C) 98.7 F (37.1 C)  SpO2: 97% 95%   Gen: In bed, NAD Resp: non-labored breathing, no acute distress Abd: soft, nt  Neuro: MS: Awake, alert, interactive and appropriate CN: Pupils equal round and reactive, extraocular movements intact, visual fields full Motor: Moves all extremities well Sensory: Intact light touch   Impression: 53 year old who presented new onset status epilepticus and encephalopathy in the setting of cocaine use.  Though she had had recent cocaine use, she was not susing at the time of seizure. With structural lesions and no signs of PRES, I would favor continued seizure medication. She doe snot drive, but I informed her she is not allowed to by law for 6 months.   Recommendations: 1) continue Keppra 1 g every 12 hours 2) No further recommendations from neurology. Please call with further questions or concerns.   Roland Rack, MD [facility] [phone]  If 7pm- 7am, please page neurology on call as listed in [place].

## 2019-04-02 NOTE — TOC Initial Note (Signed)
Transition of Care [facility]) - Initial/Assessment Note    Patient Details  Name: Diane Gilbert MRN: [id number] Date of Birth: [birth year]  [facility] CM/SW Contact:    Carles Collet, RN Phone Number: 04/02/2019, 8:17 AM  Clinical Narrative:              Has home oxygen through [employer] per chart review and a HHA w [facility]. Patient has been counseled for drug use by CSW. TOC will continue to follow.       Expected Discharge Plan: Home/Self Care Barriers to Discharge: Continued Medical Work up   Patient Goals and CMS Choice        Expected Discharge Plan and Services Expected Discharge Plan: Home/Self Care In-house Referral: Clinical Social Work Discharge Planning Services: CM Consult Post Acute Care Choice: [place] arrangements for the past 2 months: Single Family Home                           HH Arranged: PCS/Personal Care Services(active w [facility])          Prior Living Arrangements/Services Living arrangements for the past 2 months: Single Family Home Lives with:: Self              Current home services: DME(oxygen [employer])    Activities of Daily Living Home Assistive Devices/Equipment: None ADL Screening (condition at time of admission) Patient's cognitive ability adequate to safely complete daily activities?: Yes Is the patient deaf or have difficulty hearing?: No Does the patient have difficulty seeing, even when wearing glasses/contacts?: No Does the patient have difficulty concentrating, remembering, or making decisions?: No Patient able to express need for assistance with ADLs?: Yes Does the patient have difficulty dressing or bathing?: No Independently performs ADLs?: Yes (appropriate for developmental age) Does the patient have difficulty walking or climbing stairs?: No Weakness of Legs: None Weakness of Arms/Hands: None  Permission Sought/Granted                  Emotional Assessment               Admission diagnosis:  Seizure ([place]) [R56.9] Encephalopathy [G93.40] Cocaine use [F14.90] Patient Active Problem List   Diagnosis Date Noted  . Hx of sleep apnea 03/29/2019  . Status epilepticus ([place]) 03/29/2019  . Acute diastolic CHF (congestive heart failure) ([place]) 03/08/2019  . Acute encephalopathy 03/07/2019  . Ischemic stroke ([place]) 03/07/2019  . Acute respiratory failure with hypoxia ([place]) 02/26/2019  . Falls 02/06/2019  . Depression 02/06/2019  . Acute on chronic diastolic CHF (congestive heart failure) ([place]) 05/09/2017  . Acute on chronic respiratory failure with hypoxia and hypercapnia ([place]) 05/09/2017  . Facial swelling 03/23/2017  . Chronic respiratory failure with hypoxia ([place]) 03/23/2017  . Acute diastolic congestive heart failure ([place])   . Schizophrenia ([place]) 03/24/2016  . Leg swelling 01/16/2016  . Interstitial lung disease ([place]) 07/16/2014  . Respiratory bronchiolitis interstitial lung disease ([place]) 12/26/2013  . Irregular menstrual cycle 01/23/2013  . Smoking addiction 01/23/2013  . Alcohol abuse 02/19/2012  . Hyponatremia 02/19/2012  . Abdominal pain 02/19/2012  . Hypokalemia 02/19/2012   PCP:  Nolene Ebbs, MD Pharmacy:   [facility] [place], [place] [address] Phone: [phone] Fax: [place], [place] [address] Phone: [phone] Fax: [phone]  Social Determinants of Health (SDOH) Interventions    Readmission Risk Interventions No flowsheet data found.

## 2019-04-03 LAB — CBC
HCT: 55.8 % — ABNORMAL HIGH (ref 36.0–46.0)
Hemoglobin: 15.9 g/dL — ABNORMAL HIGH (ref 12.0–15.0)
MCH: 24.1 pg — ABNORMAL LOW (ref 26.0–34.0)
MCHC: 28.5 g/dL — ABNORMAL LOW (ref 30.0–36.0)
MCV: 84.5 fL (ref 80.0–100.0)
Platelets: 163 10*3/uL (ref 150–400)
RBC: 6.6 MIL/uL — ABNORMAL HIGH (ref 3.87–5.11)
RDW: 19.5 % — ABNORMAL HIGH (ref 11.5–15.5)
WBC: 6.2 10*3/uL (ref 4.0–10.5)
nRBC: 0 % (ref 0.0–0.2)

## 2019-04-03 LAB — BASIC METABOLIC PANEL
Anion gap: 9 (ref 5–15)
BUN: 8 mg/dL (ref 6–20)
CO2: 32 mmol/L (ref 22–32)
Calcium: 9.6 mg/dL (ref 8.9–10.3)
Chloride: 100 mmol/L (ref 98–111)
Creatinine, Ser: 0.63 mg/dL (ref 0.44–1.00)
GFR calc Af Amer: >60 mL/min (ref >60)
GFR calc non Af Amer: >60 mL/min (ref >60)
Glucose, Bld: 105 mg/dL — ABNORMAL HIGH (ref 70–99)
Potassium: 4 mmol/L (ref 3.5–5.1)
Sodium: 141 mmol/L (ref 135–145)

## 2019-04-03 LAB — GLUCOSE, CAPILLARY
Glucose-Capillary: 104 mg/dL — ABNORMAL HIGH (ref 70–99)
Glucose-Capillary: 105 mg/dL — ABNORMAL HIGH (ref 70–99)

## 2019-04-03 MED ORDER — QUETIAPINE FUMARATE 100 MG PO TABS: 100.0000 mg | ORAL_TABLET | Freq: Two times a day (BID) | ORAL | Status: AC

## 2019-04-03 MED ORDER — LEVETIRACETAM 1000 MG PO TABS: 1000.0000 mg | ORAL_TABLET | Freq: Two times a day (BID) | ORAL | Status: AC

## 2019-04-03 MED ORDER — INFLUENZA VAC SPLIT QUAD 0.5 ML IM SUSY: 0.5000 mL | PREFILLED_SYRINGE | INTRAMUSCULAR | 0 refills | Status: AC

## 2019-04-03 NOTE — Discharge Instructions (Signed)
It is important that you read the given instructions as well as go over your medication list with RN to help you understand your care after this hospitalization.   Stop taking Metoprolol for now.  Do NOT use Cocaine or any other illicit substance, this can lead to death.  Stop taking Wellbutrin   Please follow-up with PCP in 1-2 weeks.  Please note that NO REFILLS for any discharge medications will be authorized once you are discharged, as it is imperative that you return to your primary care physician (or establish a relationship with a primary care physician if you do not have one) for your aftercare needs so that they can reassess your need for medications and monitor your lab values.  Please request your primary care physician to go over all Hospital Tests and Procedure/Radiological results at the follow up. Please get all Hospital records sent to your PCP by signing hospital release before you go home.   Do not drive, operating heavy machinery, perform activities at heights, swimming or participation in water activities or provide baby sitting services; until you have been seen by Primary Care Physician or a Neurologist and are cleared to do such activities.  Do not take more than prescribed Pain, Sleep and Anxiety Medications.  You were cared for by a hospitalist during your hospital stay. If you have any questions about your discharge medications or the care you received while you were in the hospital after you are discharged, you can call the unit _0 @ you were admitted to and ask to speak with the hospitalist Berle Mull. Ask for Hospitalist on call if the hospitalist that took care of you is not available.   Once you are discharged, your primary care physician will handle any further medical issues.  You Must read complete instructions/literature along with all the possible adverse reactions/side effects for all the Medicines you take and that have been prescribed to you.  Take any new Medicines after you have completely understood and accept all the possible adverse reactions/side effects.  If you have smoked or chewed Tobacco in the last 2 yrs please STOP smoking STOP any Recreational drug use. PLEASE AVOID cocaine while on metoprolol.   If you drink alcohol, please safely STOP the use. Do not drive, operating heavy machinery, perform activities at heights, swimming or participation in water activities or provide baby sitting services under influence.  Wear Seat belts while driving.   Increase activity slowly

## 2019-04-03 NOTE — Progress Notes (Signed)
CSW met with patient at bedside. CSW provided substance abuse resources. The patient felt that she did not have any issue but took the resources.   CSW is signing off now, as the patient is discharging home.   Domenic Schwab, MSW, [place] Worker [facility]  [phone]

## 2019-04-03 NOTE — Discharge Summary (Signed)
Physician Discharge Summary  Diane Gilbert PFY:[id number] DOB: 1966-03-16 DOA: 03/29/2019  PCP: Nolene Ebbs, MD  Admit date: 03/29/2019 Discharge date: 04/03/2019  Admitted From: Home  Disposition: Home with home health   Recommendations for Outpatient Follow-up:  1. Follow up with PCP in 1-2 weeks with BMP and CBC 2. Patient's metoprolol has been held given her recurrent use of cocaine.  Please reassess as outpatient 3. Patient cannot drive for the next 6 months until cleared by physician 4. Follow-up electrolytes and bigeminy 5. Wellbutrin discontinued in setting of seizure  Home Health: Yes Equipment/Devices: Oxygen 4 L at baseline  Discharge Condition: Stable CODE STATUS: Full Diet recommendation: Heart Healthy / Carb Modified   Brief/Interim Summary: This is a 53 year old female with past medical history of PCA stroke, RB-ILD follows with neurology outpatient, OSA not on CPAP, schizophrenia who was found down at home with convulsive seizure developing in route and treated with Versed.  Right hemiparesis and gaze deviation noted in the ED.  She was admitted on 9/30 and patient was intubated in the ED for airway protection.  UDS was positive for cocaine.  Chest x-ray with right lower lobe atelectasis.  Patient was admitted for status epilepticus, hypertensive emergency and concern for PRES and started on Keppra.  Neurology and critical care were consulted.  Patient was successfully extubated on 10/1.  Throughout hospital stay patient has had asymptomatic ventricular bigeminy and frequent PVCs with electrolyte abnormalities which were corrected in the hospital. She was deemed medically stable and was transferred to 5W from ICU on 10/3.  Discharge Diagnoses:  Active Problems:   Status epilepticus ([place])  1. Status epilepticus without prior history of seizure -suspected cocaine intoxication.  Currently on Keppra.    Was unable to get brain MRI until 10/4 due to inability to lay  flat.  MRI brain was stable with chronic infarcts, encephalomalacia advanced but nonspecific cerebral white matter signal changes with other chronic changes (see report). 1. Per neurology: Continue Keppra 1 g every 12 hours. 2. Follow-up with neurology outpatient 3. she cannot drive for the next 6 months until cleared by physician 2. Cocaine use -advised cocaine cessation refrain from illicit drug use 1. Beta blocker discontinued as patient has recurrent cocaine use 3. Frequent bigeminy and PVCs -improved with electrolyte repletion 1. Goal K greater than 4.0 2. Goal Mg greater than 2.0 3. Monitor QT while on Seroquel and Abilify 4. Acute on chronic hypoxemic respiratory failure, intubated 9/30, successful extubation 10/1 -concern for possible aspiration on CT neck and received a dose of antibiotics.  Patient is O2 dependent on home with 4 L nasal cannula at baseline.  Has been titrated down to baseline 1. PCCM consulted 5. Hypertensive emergency in setting of cocaine use -BP 152/100 in pulmonary clinic prior to arrival 6. 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 MRI brain ordered 2. Follow with neurology 7. Schizophrenia -on Abilify and Seroquel 1. Monitor QT 2. Off Wellbutrin in setting of seizure 8. 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 Continue nebulizers 2. Follow up with Pulmonology outpatient 9. Diabetes - continue home management  Discharge Instructions  Discharge Instructions    Diet - low sodium heart healthy   Complete by: As directed    Diet Carb Modified   Complete by: As directed    Increase activity slowly   Complete by: As directed    Increase activity slowly  Complete by: As directed      Allergies as of 04/03/2019      Reactions   Unasyn [ampicillin-sulbactam Sodium] Swelling, Rash   angioedema   Contrast Media [iodinated Diagnostic Agents] Nausea And  Vomiting   Hydralazine Swelling           Allergies  Allergen Reactions  . Unasyn [Ampicillin-Sulbactam Sodium] Swelling and Rash    angioedema  . Contrast Media [Iodinated Diagnostic Agents] Nausea And Vomiting  . Hydralazine Swelling    Consultations:  Pulmonology/critical care  Neurology   Procedures/Studies: Ct Angio Head W Or Wo Contrast  Result Date: 03/29/2019 CLINICAL DATA:  Code stroke. Focal neuro deficit, greater than 6 hours, stroke suspected. EXAM: CT ANGIOGRAPHY HEAD AND NECK TECHNIQUE: Multidetector CT imaging of the head and neck was performed using the standard protocol during bolus administration of intravenous contrast. Multiplanar CT image reconstructions and MIPs were obtained to evaluate the vascular anatomy. Carotid stenosis measurements (when applicable) are obtained utilizing NASCET criteria, using the distal internal carotid diameter as the denominator. CONTRAST:  67m OMNIPAQUE IOHEXOL 300 MG/ML  SOLN COMPARISON:  Noncontrast head CT 03/29/2019, brain MRI 03/08/2019 FINDINGS: CTA NECK FINDINGS The examination is significantly motion degraded, limiting evaluation Aortic arch: Standard branching. Included portions of the aortic arch demonstrate no evidence of dissection or aneurysm. Right carotid system: The right common and cervical internal carotid arteries appear patent without significant stenosis (50% or greater). Soft and calcified plaque at the carotid bifurcation Left carotid system: The left common and cervical internal carotid arteries appear patent without significant stenosis (50% or greater). Soft and calcified plaque at the carotid bifurcation Vertebral arteries: The bilateral vertebral arteries are patent within the neck. Significant motion degradation limits evaluation for stenoses within these vessels. Skeleton: Straightening of the expected cervical lordosis. Subacute appearing fracture of the anterior right first rib. Other neck: The glottis is  closed. However, there is apparent symmetric swelling of the glottic/supraglottic soft tissues with questionable airway narrowing. Upper chest: Extensive patchy opacity within the bilateral lung apices likely reflecting aspiration/pneumonia. Review of the MIP images confirms the above findings CTA HEAD FINDINGS Significant motion degradation limits evaluation. Anterior circulation: The bilateral intracranial internal carotid arteries are patent. Soft and calcified plaque within the distal cavernous left ICA results in mild luminal narrowing. The right middle and anterior cerebral arteries are patent without evidence of high-grade proximal stenosis. The left middle and anterior cerebral arteries are patent. Question nonocclusive web-like stenosis within the distal M1 left middle cerebral artery (series 8, image 103). No intracranial aneurysm is identified Posterior circulation: The intracranial vertebral arteries are patent without convincing evidence of high-grade stenosis. Mild atherosclerotic irregularity of the basilar artery. The basilar artery is patent without high-grade stenosis. The bilateral posterior cerebral arteries are patent without evidence of high-grade proximal stenosis. Predominantly fetal origin of the right PCA. Please note that motion artifact significantly limits evaluation of the cerebellar arteries Venous sinuses: Within limitations of contrast timing, no convincing thrombus. Anatomic variants: None significant Review of the MIP images confirms the above findings These results were called by telephone at the time of interpretation on 03/29/2019 at 6:35 pm to provider [facility], who verbally acknowledged these results. IMPRESSION: CTA head: 1. Evaluation significantly limited by motion degradation. 2. No intracranial large vessel occlusion identified. 3. Question nonocclusive web-like stenosis within the distal M1 left middle cerebral artery. 4. Atherosclerotic plaque results in mild  narrowing of the distal cavernous left ICA. 5. Please note motion significant limits  evaluation of the cerebellar arteries. CTA neck: 1. Evaluation significantly limited by motion artifact. 2. The common and cervical internal carotid arteries appear patent within the neck without significant stenosis (50% or greater). Soft and calcified plaque at the carotid bifurcations bilaterally. 3. The vertebral arteries appear patent throughout the neck. Significant motion degradation limits evaluation for stenoses within these vessels. 4. Apparent symmetric swelling of the glottic/supraglottic soft tissues with possible airway narrowing. Clinical correlation recommended. 5. Extensive patchy opacity within the imaged lung apices likely reflecting sequela of aspiration/pneumonia. 6. Subacute appearing fracture of the anterior right first rib. Electronically Signed   By: Kellie Simmering   On: 03/29/2019 19:13   Dg Chest 2 View  Result Date: 03/07/2019 CLINICAL DATA:  Shortness of breath EXAM: CHEST - 2 VIEW COMPARISON:  02/22/2019 FINDINGS: Cardiomegaly with vascular congestion. Diffuse interstitial and alveolar opacities concerning for edema. No effusions. No acute bony abnormality. IMPRESSION: Cardiomegaly with vascular congestion and concern for mild pulmonary edema. Electronically Signed   By: Rolm Baptise M.D.   On: 03/07/2019 19:00   Dg Ankle Complete Right  Result Date: 03/07/2019 CLINICAL DATA:  Fall with ankle pain EXAM: RIGHT ANKLE - COMPLETE 3+ VIEW COMPARISON:  None. FINDINGS: No fracture or malalignment. Ankle mortise is symmetric. Soft tissues are unremarkable. IMPRESSION: No acute osseous abnormality Electronically Signed   By: Donavan Foil M.D.   On: 03/07/2019 19:01   Ct Head Wo Contrast  Result Date: 03/07/2019 CLINICAL DATA:  Fall 3 times today. EXAM: CT HEAD WITHOUT CONTRAST TECHNIQUE: Contiguous axial images were obtained from the base of the skull through the vertex without intravenous contrast.  COMPARISON:  None. FINDINGS: Brain: Infarcts noted in the left occipital lobe which appears subacute to chronic. No acute intracranial abnormality. Specifically, no hemorrhage, hydrocephalus, mass lesion, acute infarction, or significant intracranial injury. Mild chronic small vessel disease throughout the deep white matter. Vascular: No hyperdense vessel or unexpected calcification. Skull: No acute calvarial abnormality. Sinuses/Orbits: Visualized paranasal sinuses and mastoids clear. Orbital soft tissues unremarkable. 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 Chronic small vessel disease throughout the deep white matter. Electronically Signed   By: Rolm Baptise M.D.   On: 03/07/2019 19:36   Ct Angio Neck W Or Wo Contrast  Result Date: 03/29/2019 CLINICAL DATA:  Code stroke. Focal neuro deficit, greater than 6 hours, stroke suspected. EXAM: CT ANGIOGRAPHY HEAD AND NECK TECHNIQUE: Multidetector CT imaging of the head and neck was performed using the standard protocol during bolus administration of intravenous contrast. Multiplanar CT image reconstructions and MIPs were obtained to evaluate the vascular anatomy. Carotid stenosis measurements (when applicable) are obtained utilizing NASCET criteria, using the distal internal carotid diameter as the denominator. CONTRAST:  66m OMNIPAQUE IOHEXOL 300 MG/ML  SOLN COMPARISON:  Noncontrast head CT 03/29/2019, brain MRI 03/08/2019 FINDINGS: CTA NECK FINDINGS The examination is significantly motion degraded, limiting evaluation Aortic arch: Standard branching. Included portions of the aortic arch demonstrate no evidence of dissection or aneurysm. Right carotid system: The right common and cervical internal carotid arteries appear patent without significant stenosis (50% or greater). Soft and calcified plaque at the carotid bifurcation Left carotid system: The left common and cervical internal carotid arteries appear patent without  significant stenosis (50% or greater). Soft and calcified plaque at the carotid bifurcation Vertebral arteries: The bilateral vertebral arteries are patent within the neck. Significant motion degradation limits evaluation for stenoses within these vessels. Skeleton: Straightening of the expected cervical lordosis. Subacute appearing  fracture of the anterior right first rib. Other neck: The glottis is closed. However, there is apparent symmetric swelling of the glottic/supraglottic soft tissues with questionable airway narrowing. Upper chest: Extensive patchy opacity within the bilateral lung apices likely reflecting aspiration/pneumonia. Review of the MIP images confirms the above findings CTA HEAD FINDINGS Significant motion degradation limits evaluation. Anterior circulation: The bilateral intracranial internal carotid arteries are patent. Soft and calcified plaque within the distal cavernous left ICA results in mild luminal narrowing. The right middle and anterior cerebral arteries are patent without evidence of high-grade proximal stenosis. The left middle and anterior cerebral arteries are patent. Question nonocclusive web-like stenosis within the distal M1 left middle cerebral artery (series 8, image 103). No intracranial aneurysm is identified Posterior circulation: The intracranial vertebral arteries are patent without convincing evidence of high-grade stenosis. Mild atherosclerotic irregularity of the basilar artery. The basilar artery is patent without high-grade stenosis. The bilateral posterior cerebral arteries are patent without evidence of high-grade proximal stenosis. Predominantly fetal origin of the right PCA. Please note that motion artifact significantly limits evaluation of the cerebellar arteries Venous sinuses: Within limitations of contrast timing, no convincing thrombus. Anatomic variants: None significant Review of the MIP images confirms the above findings These results were called by  telephone at the time of interpretation on 03/29/2019 at 6:35 pm to provider MCNEILL [facility] , who verbally acknowledged these results. IMPRESSION: CTA head: 1. Evaluation significantly limited by motion degradation. 2. No intracranial large vessel occlusion identified. 3. Question nonocclusive web-like stenosis within the distal M1 left middle cerebral artery. 4. Atherosclerotic plaque results in mild narrowing of the distal cavernous left ICA. 5. Please note motion significant limits evaluation of the cerebellar arteries. CTA neck: 1. Evaluation significantly limited by motion artifact. 2. The common and cervical internal carotid arteries appear patent within the neck without significant stenosis (50% or greater). Soft and calcified plaque at the carotid bifurcations bilaterally. 3. The vertebral arteries appear patent throughout the neck. Significant motion degradation limits evaluation for stenoses within these vessels. 4. Apparent symmetric swelling of the glottic/supraglottic soft tissues with possible airway narrowing. Clinical correlation recommended. 5. Extensive patchy opacity within the imaged lung apices likely reflecting sequela of aspiration/pneumonia. 6. Subacute appearing fracture of the anterior right first rib. Electronically Signed   By: Kellie Simmering   On: 03/29/2019 19:13   Mr Brain Wo Contrast  Result Date: 04/02/2019 CLINICAL DATA:  53 year old female with recent altered mental status. New onset seizures. EXAM: MRI HEAD WITHOUT CONTRAST TECHNIQUE: Multiplanar, multiecho pulse sequences of the brain and surrounding structures were obtained without intravenous contrast. COMPARISON:  CT head and CTA head and neck 03/29/2019. Brain MRI 03/08/2019. FINDINGS: Brain: Chronic encephalomalacia in the left occipital pole, bilateral globus pallidus. Small chronic infarcts in the right cerebellum. No restricted diffusion to suggest acute infarction. No midline shift, mass effect, evidence of mass  lesion, ventriculomegaly, extra-axial collection or acute intracranial hemorrhage. Cervicomedullary junction and pituitary are within normal limits. Thin slice coronal images of the temporal lobes. No definite hippocampal asymmetry or signal abnormality. No cortical encephalomalacia identified outside of the left PCA territory. No definite chronic cerebral blood products. Widely scattered and patchy bilateral cerebral white matter T2 and FLAIR hyperintensity in a nonspecific configuration. Negative thalami and brainstem. Vascular: Major intracranial vascular flow voids are stable. Skull and upper cervical spine: Partially visible cervical spine degeneration including probable mild spinal stenosis at C3-C4 (series 10, image 13). Visualized bone marrow signal is within normal limits. Sinuses/Orbits: Disconjugate  gaze but otherwise negative orbits. Chronic paranasal sinus disease, increased right sphenoid mucosal thickening since last month. Other: Mastoids remain clear. Visible internal auditory structures appear normal. Scalp and face soft tissues appear negative. IMPRESSION: 1. No acute intracranial abnormality. 2. Stable from last month: Chronic infarcts in the left PCA and right cerebellar artery territory. Encephalomalacia in the bilateral globus pallidus. Advanced but nonspecific cerebral white matter signal changes. 3. Partially visible cervical spine degeneration with mild spinal stenosis suspected at C3-C4. Electronically Signed   By: Genevie Ann M.D.   On: 04/02/2019 08:58   Mr Brain Wo Contrast  Result Date: 03/08/2019 CLINICAL DATA:  53 year old female with recent falls. Age indeterminate left occipital infarct on head CT yesterday. EXAM: MRI HEAD WITHOUT CONTRAST TECHNIQUE: Multiplanar, multiecho pulse sequences of the brain and surrounding structures were obtained without intravenous contrast. COMPARISON:  Head CT 03/07/2019. FINDINGS: Study is intermittently degraded by motion artifact despite repeated  imaging attempts. Brain: Cortical encephalomalacia in the left occipital pole and lateral aspect of the left occipital lobe is associated with facilitated diffusion. No convincing restricted diffusion to suggest acute infarction. Chronic appearing lacunar infarcts in both globus pallidus. Scattered and patchy bilateral cerebral white matter T2 and FLAIR hyperintensity which is moderately advanced for age. Small chronic infarct in the right cerebellum (series 5, image 7). No other cortical encephalomalacia. No chronic cerebral blood products identified. No midline shift, mass effect, evidence of mass lesion, ventriculomegaly, extra-axial collection or acute intracranial hemorrhage. Cervicomedullary junction and pituitary are within normal limits. Vascular: Major intracranial vascular flow voids are preserved. Skull and upper cervical spine: Grossly negative visible cervical spine. Grossly normal bone marrow signal. Sinuses/Orbits: Negative orbits aside from suggestion of increased intraorbital fat bilaterally. Only trace paranasal sinus mucosal thickening. Other: Mastoids are clear. Scalp and face soft tissues appear negative. IMPRESSION: 1. Intermittently motion degraded exam with no acute intracranial abnormality. 2. Chronic left PCA territory infarct. Chronic small vessel infarcts in the bilateral globus pallidus, the right cerebellum, and probably also likely scattered in the cerebral white matter. 3. 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 Electronically Signed   By: Genevie Ann M.D.   On: 03/08/2019 15:13   Dg Chest Port 1 View  Result Date: 03/31/2019 CLINICAL DATA:  Respiratory failure. EXAM: PORTABLE CHEST 1 VIEW COMPARISON:  03/29/2019 FINDINGS: The patient has been extubated. The heart size is enlarged. There are prominent interstitial lung markings bilaterally with probable small bilateral pleural effusions and adjacent atelectasis. The right  lung base is not fully visualized on this exam. There is no pneumothorax. IMPRESSION: Cardiomegaly with findings concerning for pulmonary edema. There are likely small bilateral pleural effusions. Electronically Signed   By: Constance Holster M.D.   On: 03/31/2019 08:12   Dg Chest Portable 1 View  Result Date: 03/29/2019 CLINICAL DATA:  Post intubation EXAM: PORTABLE CHEST 1 VIEW COMPARISON:  Radiograph 03/07/2019, CT February 22, 2019 FINDINGS: Interval placement of an endotracheal tube position low within the trachea approximately 1.5 cm from the carina should be retracted at least 2 cm to the mid trachea. A transesophageal tube tip and side port distal to the GE junction curling in the left upper quadrant. There is obscuration of the right hemidiaphragm suggesting a pleural effusion likely with some adjacent atelectasis given the rightward mediastinal shift. Additional density silhouetting the right heart border may reflect partial collapse of the right lower lobe. Diffuse interstitial and airspace opacities are similar in extent to  prior exam with a perihilar predominance. Cardiomegaly is unchanged accounting for differences in technique. IMPRESSION: 1. Interval placement of an endotracheal tube within the trachea, approximately 1.5 cm from the carina should be retracted at least 2 cm to the mid trachea. 2. A transesophageal tube tip and side port along the GE junction curling in the left upper quadrant. 3. Interstitial and airspace opacities suggestive of pulmonary edema with cardiomegaly. Underlying infection is not excluded. 4. Density along the right heart border with rightward mediastinal shift suggesting partial collapse of the right lower lobe. Electronically Signed   By: Lovena Le M.D.   On: 03/29/2019 20:02   Dg Abd Portable 1v  Result Date: 03/11/2019 CLINICAL DATA:  Left-sided abdominal pain after fall 3 days ago. EXAM: PORTABLE ABDOMEN - 1 VIEW COMPARISON:  None. FINDINGS: Bowel gas pattern  is nonobstructive with mild fecal retention over the right colon and rectosigmoid colon. No free peritoneal air. Fusion hardware intact over the lower lumbar spine. Remainder the exam is unremarkable. IMPRESSION: Nonobstructive bowel gas pattern with mild fecal retention over the right colon and rectosigmoid colon. Electronically Signed   By: Marin Olp M.D.   On: 03/11/2019 15:51   Ct Head Code Stroke Wo Contrast  Result Date: 03/29/2019 CLINICAL DATA:  Code stroke. Altered mental status (AMS), unclear cause. Additional history provided: Patient found by family on the ground unresponsive, drooling profusely. Subsequent seizure. aa EXAM: CT HEAD WITHOUT CONTRAST TECHNIQUE: Contiguous axial images were obtained from the base of the skull through the vertex without intravenous contrast. COMPARISON:  Brain MRI 03/08/2011, head CT 03/07/2019 FINDINGS: Brain: No evidence of acute intracranial hemorrhage. No acute demarcated cortical infarction is identified. Redemonstrated chronic left PCA territory infarct. Known chronic lacunar infarcts within the bilateral basal ganglia and right cerebellum. Question subtle asymmetric hypodensity within the left cerebellum (series 2, image 9). No evidence of intracranial mass. No midline shift or extra-axial fluid collection. Ill-defined hypoattenuation of the cerebral white matter is nonspecific, but consistent with chronic small vessel ischemic disease Cerebral volume is normal for age. Vascular: No hyperdense vessel. Skull: No calvarial fracture Sinuses/Orbits: Visualized orbits demonstrate no acute abnormality. Minimal scattered paranasal sinus mucosal thickening. No significant mastoid effusion. ASPECTS [facility] Stroke Program Early CT Score) not provided given history. These results were called by telephone at the time of interpretation on 03/29/2019 at 6:35 pm to provider Dr. Leonel Ramsay, who verbally acknowledged these results. IMPRESSION: 1. No evidence of acute  intracranial hemorrhage or acute demarcated cortical infarction. 2. Question subtle nonspecific asymmetric hypodensity within the left cerebellum. Consider brain MRI for further evaluation. 3. Redemonstrated chronic left PCA territory infarct. 4. Known chronic lacunar infarcts within the bilateral basal ganglia and right cerebellum. 5. Chronic small vessel ischemic disease. Electronically Signed   By: Kellie Simmering   On: 03/29/2019 18:41   (Echo, Carotid, EGD, Colonoscopy, ERCP)    Subjective:   Discharge Exam: Vitals:   04/03/19 0808 04/03/19 0849  BP: (!) 165/103   Pulse: 80   Resp: 18   Temp: 98.1 F (36.7 C)   SpO2: 97% 93%   Vitals:   04/03/19 0408 04/03/19 0700 04/03/19 0808 04/03/19 0849  BP: (!) 151/93  (!) 165/103   Pulse: 74  80   Resp: _0 Temp: 97.6 F (36.4 C)  98.1 F (36.7 C)   TempSrc: Oral  Oral   SpO2: 99%  97% 93%  Weight:      Height:  General: Pt is alert, awake, not in acute distress, at baseline Cardiovascular: RRR, S1/S2 +, no rubs, no gallops Respiratory: CTA bilaterally, no wheezing, no rhonchi Abdominal: Soft, NT, ND, bowel sounds + Extremities: no edema, no cyanosis    The results of significant diagnostics from this hospitalization (including imaging, microbiology, ancillary and laboratory) are listed below for reference.     Microbiology: Recent Results (from the past 240 hour(s))  SARS Coronavirus 2 [place] order, Performed in [facility] hospital lab) Nasopharyngeal Nasopharyngeal Swab     Status: None   Collection Time: 03/29/19  7:03 PM   Specimen: Nasopharyngeal Swab  Result Value Ref Range Status   SARS Coronavirus 2 NEGATIVE NEGATIVE Final    Comment: (NOTE) If result is NEGATIVE SARS-CoV-2 target nucleic acids are NOT DETECTED. The SARS-CoV-2 RNA is generally detectable in upper and lower  respiratory specimens during the acute phase of infection. The lowest  concentration of SARS-CoV-2 viral copies this assay  can detect is 250  copies / mL. A negative result does not preclude SARS-CoV-2 infection  and should not be used as the sole basis for treatment or other  patient management decisions.  A negative result may occur with  improper specimen collection / handling, submission of specimen other  than nasopharyngeal swab, presence of viral mutation(s) within the  areas targeted by this assay, and inadequate number of viral copies  (<250 copies / mL). A negative result must be combined with clinical  observations, patient history, and epidemiological information. If result is POSITIVE SARS-CoV-2 target nucleic acids are DETECTED. The SARS-CoV-2 RNA is generally detectable in upper and lower  respiratory specimens dur ing the acute phase of infection.  Positive  results are indicative of active infection with SARS-CoV-2.  Clinical  correlation with patient history and other diagnostic information is  necessary to determine patient infection status.  Positive results do  not rule out bacterial infection or co-infection with other viruses. If result is PRESUMPTIVE POSTIVE SARS-CoV-2 nucleic acids MAY BE PRESENT.   A presumptive positive result was obtained on the submitted specimen  and confirmed on repeat testing.  While 2019 novel coronavirus  (SARS-CoV-2) nucleic acids may be present in the submitted sample  additional confirmatory testing may be necessary for epidemiological  and / or clinical management purposes  to differentiate between  SARS-CoV-2 and other Sarbecovirus currently known to infect humans.  If clinically indicated additional testing with an alternate test  methodology [id number]) is advised. The SARS-CoV-2 RNA is generally  detectable in upper and lower respiratory sp ecimens during the acute  phase of infection. The expected result is Negative. Fact Sheet for Patients:  StrictlyIdeas.no Fact Sheet for Healthcare  Providers: BankingDealers.co.za This test is not yet approved or cleared by the [place] FDA and has been authorized for detection and/or diagnosis of SARS-CoV-2 by FDA under an Emergency Use Authorization (EUA).  This EUA will remain in effect (meaning this test can be used) for the duration of the COVID-19 declaration under Section 564(b)(1) of the Act, 21 U.S.C. section 360bbb-3(b)(1), unless the authorization is terminated or revoked sooner. Performed at [facility], [place] [address], [place], [place] [address]   MRSA PCR Screening     Status: None   Collection Time: 03/30/19  2:14 AM   Specimen: Nasopharyngeal  Result Value Ref Range Status   MRSA by PCR NEGATIVE NEGATIVE Final    Comment:        The GeneXpert MRSA Assay (FDA approved for  NASAL specimens only), is one component of a comprehensive MRSA colonization surveillance program. It is not intended to diagnose MRSA infection nor to guide or monitor treatment for MRSA infections. Performed at [facility], [place] [address] [place], [place] [address]   Culture, blood (Routine X 2) w Reflex to ID Panel     Status: None (Preliminary result)   Collection Time: 03/30/19 10:38 AM   Specimen: BLOOD  Result Value Ref Range Status   Specimen Description BLOOD BLOOD LEFT HAND  Final   Special Requests   Final    BOTTLES DRAWN AEROBIC ONLY Blood Culture adequate volume   Culture   Final    NO GROWTH 4 DAYS Performed at [facility], [place] [address] [place], [place] [address]    Report Status PENDING  Incomplete  Culture, blood (Routine X 2) w Reflex to ID Panel     Status: None (Preliminary result)   Collection Time: 03/30/19 10:48 AM   Specimen: BLOOD  Result Value Ref Range Status   Specimen Description BLOOD BLOOD RIGHT HAND  Final   Special Requests   Final    BOTTLES DRAWN AEROBIC ONLY Blood Culture adequate volume   Culture   Final    NO GROWTH 4 DAYS Performed at [facility], [place] [address], [place], [place] [address]    Report Status PENDING  Incomplete  Culture, respiratory (non-expectorated)     Status: None   Collection Time: 03/30/19  3:13 PM   Specimen: Tracheal Aspirate; Respiratory  Result Value Ref Range Status   Specimen Description TRACHEAL ASPIRATE  Final   Special Requests Normal  Final   Gram Stain   Final    RARE WBC PRESENT, PREDOMINANTLY PMN ABUNDANT SQUAMOUS EPITHELIAL CELLS PRESENT MODERATE GRAM NEGATIVE RODS MODERATE GRAM POSITIVE COCCI FEW GRAM POSITIVE RODS    Culture   Final    ABUNDANT Consistent with normal respiratory flora. Performed at [facility], [place] [address], [place],  [address]    Report Status 04/01/2019 FINAL  Final     Labs: BNP (last 3 results) Recent Labs    08/02/18 1557 02/26/19 2110 03/07/19 1809  BNP 319.7* 246.7* 00.3   Basic Metabolic Panel: Recent Labs  Lab 03/29/19 2135  03/30/19 0242  03/30/19 2317 03/31/19 0235 04/01/19 1203 04/02/19 0253 04/03/19 0341  NA  --    < > 141   < > 142 143 141 142 141  K  --    < > 4.3   < > 3.3* 3.4* 3.8 3.6 4.0  CL  --   --  95*   < > 99 99 102 103 100  CO2  --   --  29   < > 33* _0 32  GLUCOSE  --   --  91   < > 85 78 152* 152* 105*  BUN  --   --  12   < > _1 CREATININE  --   --  0.62   < > 0.74 0.76 0.59 0.60 0.63  CALCIUM  --   --  9.6   < > 9.2 9.2 8.8* 8.8* 9.6  MG 1.8  --  1.8  --  1.9 1.9 1.8 2.1  --   PHOS 4.2  --  3.4  --  3.8 3.8  --   --   --    < > = values in this interval not  displayed.   Liver Function Tests: Recent Labs  Lab 03/29/19 1804  AST 29  ALT 27  ALKPHOS 70  BILITOT 0.8  PROT 8.6*  ALBUMIN 4.4   No results for input(s): LIPASE, AMYLASE in the last 168 hours. No results for input(s): AMMONIA in the last 168 hours. CBC: Recent Labs  Lab 03/29/19 1804  03/30/19 0242 03/30/19 0407 03/31/19 0235 04/01/19 1203 04/02/19 0253 04/03/19 0341  WBC 10.4  --  11.9*  --  8.8 5.7  5.6 6.2  NEUTROABS 4.8  --   --   --  4.6  --   --   --   HGB 18.0*   < > 17.7* 20.4* 17.1* 15.8* 16.2* 15.9*  HCT 63.9*   < > 61.8* 60.0* 59.8* 56.4* 56.4* 55.8*  MCV 85.2  --  82.3  --  84.3 84.6 84.4 84.5  PLT 197  --  171  --  152 149* 155 163   < > = values in this interval not displayed.   Cardiac Enzymes: No results for input(s): CKTOTAL, CKMB, CKMBINDEX, TROPONINI in the last 168 hours. BNP: Invalid input(s): POCBNP CBG: Recent Labs  Lab 04/02/19 1129 04/02/19 1628 04/02/19 2101 04/03/19 0736 04/03/19 1134  GLUCAP 168* 112* 115* 104* 105*   D-Dimer No results for input(s): DDIMER in the last 72 hours. Hgb A1c No results for input(s): HGBA1C in the last 72 hours. Lipid Profile No results for input(s): CHOL, HDL, LDLCALC, TRIG, CHOLHDL, LDLDIRECT in the last 72 hours. Thyroid function studies No results for input(s): TSH, T4TOTAL, T3FREE, THYROIDAB in the last 72 hours.  Invalid input(s): FREET3 Anemia work up No results for input(s): VITAMINB12, FOLATE, FERRITIN, TIBC, IRON, RETICCTPCT in the last 72 hours. Urinalysis    Component Value Date/Time   COLORURINE STRAW (A) 03/29/2019 1840   APPEARANCEUR CLEAR 03/29/2019 1840   LABSPEC 1.028 03/29/2019 1840   PHURINE 7.0 03/29/2019 1840   GLUCOSEU NEGATIVE 03/29/2019 1840   HGBUR NEGATIVE 03/29/2019 [place] NEGATIVE 03/29/2019 [place] 03/29/2019 1840   PROTEINUR 100 (A) 03/29/2019 1840   UROBILINOGEN 0.2 07/13/2014 1122   NITRITE NEGATIVE 03/29/2019 1840   LEUKOCYTESUR NEGATIVE 03/29/2019 1840   Sepsis Labs Invalid input(s): PROCALCITONIN,  WBC,  LACTICIDVEN Microbiology Recent Results (from the past 240 hour(s))  SARS Coronavirus 2 [facility] order, Performed in [facility] hospital lab) Nasopharyngeal Nasopharyngeal Swab     Status: None   Collection Time: 03/29/19  7:03 PM   Specimen: Nasopharyngeal Swab  Result Value Ref Range Status   SARS Coronavirus 2 NEGATIVE NEGATIVE  Final    Comment: (NOTE) If result is NEGATIVE SARS-CoV-2 target nucleic acids are NOT DETECTED. The SARS-CoV-2 RNA is generally detectable in upper and lower  respiratory specimens during the acute phase of infection. The lowest  concentration of SARS-CoV-2 viral copies this assay can detect is 250  copies / mL. A negative result does not preclude SARS-CoV-2 infection  and should not be used as the sole basis for treatment or other  patient management decisions.  A negative result may occur with  improper specimen collection / handling, submission of specimen other  than nasopharyngeal swab, presence of viral mutation(s) within the  areas targeted by this assay, and inadequate number of viral copies  (<250 copies / mL). A negative result must be combined with clinical  observations, patient history, and epidemiological information. If result is POSITIVE SARS-CoV-2 target nucleic acids are DETECTED. The SARS-CoV-2 RNA is generally  detectable in upper and lower  respiratory specimens dur ing the acute phase of infection.  Positive  results are indicative of active infection with SARS-CoV-2.  Clinical  correlation with patient history and other diagnostic information is  necessary to determine patient infection status.  Positive results do  not rule out bacterial infection or co-infection with other viruses. If result is PRESUMPTIVE POSTIVE SARS-CoV-2 nucleic acids MAY BE PRESENT.   A presumptive positive result was obtained on the submitted specimen  and confirmed on repeat testing.  While 2019 novel coronavirus  (SARS-CoV-2) nucleic acids may be present in the submitted sample  additional confirmatory testing may be necessary for epidemiological  and / or clinical management purposes  to differentiate between  SARS-CoV-2 and other Sarbecovirus currently known to infect humans.  If clinically indicated additional testing with an alternate test  methodology [id number]) is advised. The  SARS-CoV-2 RNA is generally  detectable in upper and lower respiratory sp ecimens during the acute  phase of infection. The expected result is Negative. Fact Sheet for Patients:  StrictlyIdeas.no Fact Sheet for Healthcare Providers: BankingDealers.co.za This test is not yet approved or cleared by the [place] FDA and has been authorized for detection and/or diagnosis of SARS-CoV-2 by FDA under an Emergency Use Authorization (EUA).  This EUA will remain in effect (meaning this test can be used) for the duration of the COVID-19 declaration under Section 564(b)(1) of the Act, 21 U.S.C. section 360bbb-3(b)(1), unless the authorization is terminated or revoked sooner. Performed at [facility], [place] [address], [place], [place] [address]   MRSA PCR Screening     Status: None   Collection Time: 03/30/19  2:14 AM   Specimen: Nasopharyngeal  Result Value Ref Range Status   MRSA by PCR NEGATIVE NEGATIVE Final    Comment:        The GeneXpert MRSA Assay (FDA approved for NASAL specimens only), is one component of a comprehensive MRSA colonization surveillance program. It is not intended to diagnose MRSA infection nor to guide or monitor treatment for MRSA infections. Performed at [facility], [place] [address] [place], [place] [address]   Culture, blood (Routine X 2) w Reflex to ID Panel     Status: None (Preliminary result)   Collection Time: 03/30/19 10:38 AM   Specimen: BLOOD  Result Value Ref Range Status   Specimen Description BLOOD BLOOD LEFT HAND  Final   Special Requests   Final    BOTTLES DRAWN AEROBIC ONLY Blood Culture adequate volume   Culture   Final    NO GROWTH 4 DAYS Performed at [facility], [place] [address], [place], [place] [address]    Report Status PENDING  Incomplete  Culture, blood (Routine X 2) w Reflex to ID Panel     Status: None (Preliminary result)   Collection Time: 03/30/19 10:48 AM    Specimen: BLOOD  Result Value Ref Range Status   Specimen Description BLOOD BLOOD RIGHT HAND  Final   Special Requests   Final    BOTTLES DRAWN AEROBIC ONLY Blood Culture adequate volume   Culture   Final    NO GROWTH 4 DAYS Performed at [facility], [place] [address], [place],  [address]    Report Status PENDING  Incomplete  Culture, respiratory (non-expectorated)     Status: None   Collection Time: 03/30/19  3:13 PM   Specimen: Tracheal Aspirate; Respiratory  Result Value Ref Range Status   Specimen  Description TRACHEAL ASPIRATE  Final   Special Requests Normal  Final   Gram Stain   Final    RARE WBC PRESENT, PREDOMINANTLY PMN ABUNDANT SQUAMOUS EPITHELIAL CELLS PRESENT MODERATE GRAM NEGATIVE RODS MODERATE GRAM POSITIVE COCCI FEW GRAM POSITIVE RODS    Culture   Final    ABUNDANT Consistent with normal respiratory flora. Performed at [facility], [place] [address], [place], [place] [address]    Report Status 04/01/2019 FINAL  Final     Time coordinating discharge: Over 30 minutes  SIGNED:   Harold Hedge, D.O. [facility] 04/03/2019, 11:54 AM

## 2019-04-03 NOTE — Progress Notes (Signed)
Patient discharged home per MD order; discharge instructions reviewed and given to patient; Flu vaccine offered and refused, Patient stated she already received flu vaccine; Meds called in to patient's pharmacy;  IV DIC; skin intact; patient escorted to car by nurse tech via wheelchair.

## 2019-04-03 NOTE — Evaluation (Addendum)
Physical Therapy Evaluation Patient Details Name: Diane Gilbert MRN: [id number] DOB: [birth year] Today's Date: 04/03/2019   History of Present Illness  This is a 53 year old female with past medical history of PCA stroke, RB-ILD, OSA not on CPAP, schizophrenia who was found down at home with convulsive seizure, thought to be related to cocaine use; Experienced VDRF, extubated 10/1  Clinical Impression   Patient evaluated by Physical Therapy with no further acute PT needs identified, as she is dc'ing from the hospital today.  All education has been completed and the patient has no further questions. Overall walking the hallways with RW without difficulty; She is hopeful to get home soon;  See below for any follow-up Physical Therapy or equipment needs. PT is signing off. Thank you for this referral.     Follow Up Recommendations Home health PT; If insurance does not support HHPT follow up, Outpt PT is appropriate.    Equipment Recommendations  Other (comment)(She indicated she wants a Rollator RW -- tells me she has a quad cane that she doesn't like; Further equipment recommendations can be addressed by HHPT )    Recommendations for Other Services       Precautions / Restrictions Precautions Precautions: Fall Precaution Comments: pt reports she gets lightheaded prior to falling; fall risk reduced with use of assistive device      Mobility  Bed Mobility                  Transfers Overall transfer level: Modified independent               General transfer comment: did not need assist standing from toilet, used grab bars  Ambulation/Gait Ambulation/Gait assistance: Supervision Gait Distance (Feet): 300 Feet Assistive device: Rolling walker (2 wheeled);None Gait Pattern/deviations: Step-through pattern     General Gait Details: Cues to self monitor for actvity tolerance; walked in the room without assistive device without difficulty; Walked in hallway with  RW; Reported dizziness and some intolerance to the hallway lights; Supported on 4 L supplemental O2 via [place] during the walk  Financial trader [place] (Stroke Patients Only)       Balance                                             Pertinent Vitals/Pain Pain Assessment: Faces Faces Pain Scale: No hurt    Home Living Family/patient expects to be discharged to:: Private residence Living Arrangements: Alone Available Help at Discharge: Personal care attendant Type of Home: Apartment Home Access: Stairs to enter Entrance Stairs-Rails: Can reach both Entrance Stairs-Number of Steps: 1 Home Layout: One level Home Equipment: Walker - 2 wheels;Bedside commode;Shower seat;Other (comment)      Prior Function Level of Independence: Needs assistance   Gait / Transfers Assistance Needed: pt reports using RW PTA due to falls  ADL's / Homemaking Assistance Needed: reports her aide bathes and dresses her and performs housekeeping, pt can take herself to the bathroom  Comments: uses 4L O2 at home     Hand Dominance        Extremity/Trunk Assessment   Upper Extremity Assessment Upper Extremity Assessment: Overall WFL for tasks assessed(for simple tasks)    Lower Extremity Assessment Lower Extremity Assessment: Generalized weakness(Reports he knee occasionally buckles)  Communication   Communication: No difficulties  Cognition Arousal/Alertness: Awake/alert Behavior During Therapy: Flat affect Overall Cognitive Status: Within Functional Limits for tasks assessed(for simple mobility tasks)                                        General Comments General comments (skin integrity, edema, etc.): No DOE noted    Exercises     Assessment/Plan    PT Assessment All further PT needs can be met in the next venue of care  PT Problem List Decreased strength;Decreased mobility;Decreased safety  awareness;Decreased activity tolerance;Decreased balance;Decreased knowledge of use of DME;Pain       PT Treatment Interventions DME instruction;Therapeutic activities;Gait training;Therapeutic exercise;Patient/family education;Balance training;Functional mobility training    PT Goals (Current goals can be found in the Care Plan section)  Acute Rehab PT Goals Patient Stated Goal: wants to be home soon PT Goal Formulation: All assessment and education complete, DC therapy    Frequency     Barriers to discharge        Co-evaluation               AM-PAC PT "6 Clicks" Mobility  Outcome Measure Help needed turning from your back to your side while in a flat bed without using bedrails?: None Help needed moving from lying on your back to sitting on the side of a flat bed without using bedrails?: None Help needed moving to and from a bed to a chair (including a wheelchair)?: None Help needed standing up from a chair using your arms (e.g., wheelchair or bedside chair)?: None Help needed to walk in hospital room?: None Help needed climbing 3-5 steps with a railing? : A Little 6 Click Score: 23    End of Session   Activity Tolerance: Patient tolerated treatment well Patient left: in bed;with call bell/phone within reach Nurse Communication: Mobility status;Other (comment)(ok for ) PT Visit Diagnosis: Other abnormalities of gait and mobility (R26.89);Dizziness and giddiness (R42);Muscle weakness (generalized) (M62.81);History of falling (Z91.81)    Time: 9622-2979 PT Time Calculation (min) (ACUTE ONLY): 17 min   Charges:   PT Evaluation $PT Eval Low Complexity: [place], PT  Acute Rehabilitation Services Pager [phone] Office [place] 04/03/2019, 2:27 PM

## 2019-04-04 LAB — CULTURE, BLOOD (ROUTINE X 2)
Culture: NO GROWTH
Culture: NO GROWTH
Special Requests: ADEQUATE
Special Requests: ADEQUATE

## 2019-04-06 ENCOUNTER — Other Ambulatory Visit (HOSPITAL_COMMUNITY)
Admission: RE | Admit: 2019-04-06 | Discharge: 2019-04-06 | Disposition: A | Discharge status: Home / Self Care | Payer: Medicaid Other | Source: Ambulatory Visit | Attending: Pulmonary Disease | Admitting: Pulmonary Disease

## 2019-04-06 DIAGNOSIS — Z01812 Encounter for preprocedural laboratory examination: Secondary | ICD-10-CM | POA: Insufficient documentation

## 2019-04-06 DIAGNOSIS — Z20828 Contact with and (suspected) exposure to other viral communicable diseases: Secondary | ICD-10-CM | POA: Diagnosis not present

## 2019-04-07 LAB — NOVEL CORONAVIRUS, NAA (HOSP ORDER, SEND-OUT TO REF LAB; TAT 18-24 HRS): SARS-CoV-2, NAA: NOT DETECTED

## 2019-04-09 ENCOUNTER — Other Ambulatory Visit: Payer: Self-pay

## 2019-04-09 ENCOUNTER — Ambulatory Visit (HOSPITAL_BASED_OUTPATIENT_CLINIC_OR_DEPARTMENT_OTHER): Payer: Medicaid Other | Attending: Primary Care | Admitting: Pulmonary Disease

## 2019-04-09 VITALS — Ht 64.0 in | Wt 219.0 lb

## 2019-04-09 DIAGNOSIS — R0902 Hypoxemia: Secondary | ICD-10-CM | POA: Diagnosis not present

## 2019-04-09 DIAGNOSIS — G4734 Idiopathic sleep related nonobstructive alveolar hypoventilation: Secondary | ICD-10-CM

## 2019-04-09 DIAGNOSIS — G4733 Obstructive sleep apnea (adult) (pediatric): Secondary | ICD-10-CM | POA: Insufficient documentation

## 2019-04-11 ENCOUNTER — Telehealth: Payer: Self-pay | Admitting: Primary Care

## 2019-04-11 DIAGNOSIS — G4733 Obstructive sleep apnea (adult) (pediatric): Secondary | ICD-10-CM

## 2019-04-11 NOTE — Procedures (Signed)
    Patient Name: Diane Gilbert, Diane Gilbert Date: 04/09/2019 Gender: Female D.O.B: [birth year] Age (years): 65 Referring Provider: Geraldo Pitter NP Height (inches): 64 Interpreting Physician: Chesley Mires MD, ABSM Weight (lbs): 219 RPSGT: Gwenyth Allegra BMI: 31 MRN: [id number] Neck Size: 16.00  CLINICAL INFORMATION Sleep Study Type: NPSG  Indication for sleep study: History of ILD, chronic respiratory failure, hypertension.  Referred for assessment of obstructive sleep apnea.  Epworth Sleepiness Score: 15  SLEEP STUDY TECHNIQUE As per the AASM Manual for the Scoring of Sleep and Associated Events v2.3 (April 2016) with a hypopnea requiring 4% desaturations.  The channels recorded and monitored were frontal, central and occipital EEG, electrooculogram (EOG), submentalis EMG (chin), nasal and oral airflow, thoracic and abdominal wall motion, anterior tibialis EMG, snore microphone, electrocardiogram, and pulse oximetry.  MEDICATIONS Medications self-administered by patient taken the night of the study : N/A  SLEEP ARCHITECTURE The study was initiated at 10:24:22 PM and ended at 4:30:26 AM.  Sleep onset time was 0.3 minutes and the sleep efficiency was 86.4%%. The total sleep time was 316.2 minutes.  Stage REM latency was 209.5 minutes.  The patient spent 6.2%% of the night in stage N1 sleep, 85.8%% in stage N2 sleep, 0.0%% in stage N3 and 8.1% in REM.  Alpha intrusion was absent.  Supine sleep was 82.85%.  RESPIRATORY PARAMETERS The overall apnea/hypopnea index (AHI) was 2.1 per hour. There were 5 total apneas, including 5 obstructive, 0 central and 0 mixed apneas. There were 6 hypopneas and 5 RERAs.  The AHI during Stage REM sleep was 23.5 per hour.  AHI while supine was 2.3 per hour.  The mean oxygen saturation was 93.1%. The minimum SpO2 during sleep was 83.0%.  Study conducted with her using 4 liters oxygen.  soft snoring was noted during this study.   CARDIAC DATA The 2 lead EKG demonstrated sinus rhythm. The mean heart rate was 86.2 beats per minute. Other EKG findings include: PVCs.  LEG MOVEMENT DATA The total PLMS were 0 with a resulting PLMS index of 0.0. Associated arousal with leg movement index was 0.0 .  IMPRESSIONS - No significant obstructive or central sleep apnea.  Overall AHI was 2.1. - SpO2 low was 83%.  She spent  9.4 minutes with SpO2 < 88%.  She wore 4 liters oxygen during the study - occurred during this study (AHI = 2.1/h).  DIAGNOSIS - Nocturnal Hypoxemia (327.26 [G47.36 ICD-10])  RECOMMENDATIONS - Continue 4 liters oxygen at night.  [Electronically signed] 04/11/2019 04:07 PM  Chesley Mires MD, [place], American Board of Sleep Medicine   NPI: [id number]

## 2019-04-11 NOTE — Telephone Encounter (Signed)
Please let patient know sleep study showed no evidence of obstructive sleep apnea. She needs to continue to wear 4L oxygen at night.

## 2019-04-12 NOTE — Telephone Encounter (Signed)
LMOM TCB x1

## 2019-04-14 NOTE — Telephone Encounter (Signed)
Called spoke with patient, advised of sleep study and need to continue O2 4L at bedtime.  Patient voiced her understanding and denied any questions at this time.    Nothing further needed; will sign off.

## 2019-05-05 IMAGING — DX DG CHEST 1V PORT
1 series · 1 of 1 positions shown · non-contrast
Comparison: 05/09/2017 chest radiograph.

CLINICAL DATA: Dyspnea

EXAM:
PORTABLE CHEST 1 VIEW

[chest ap]
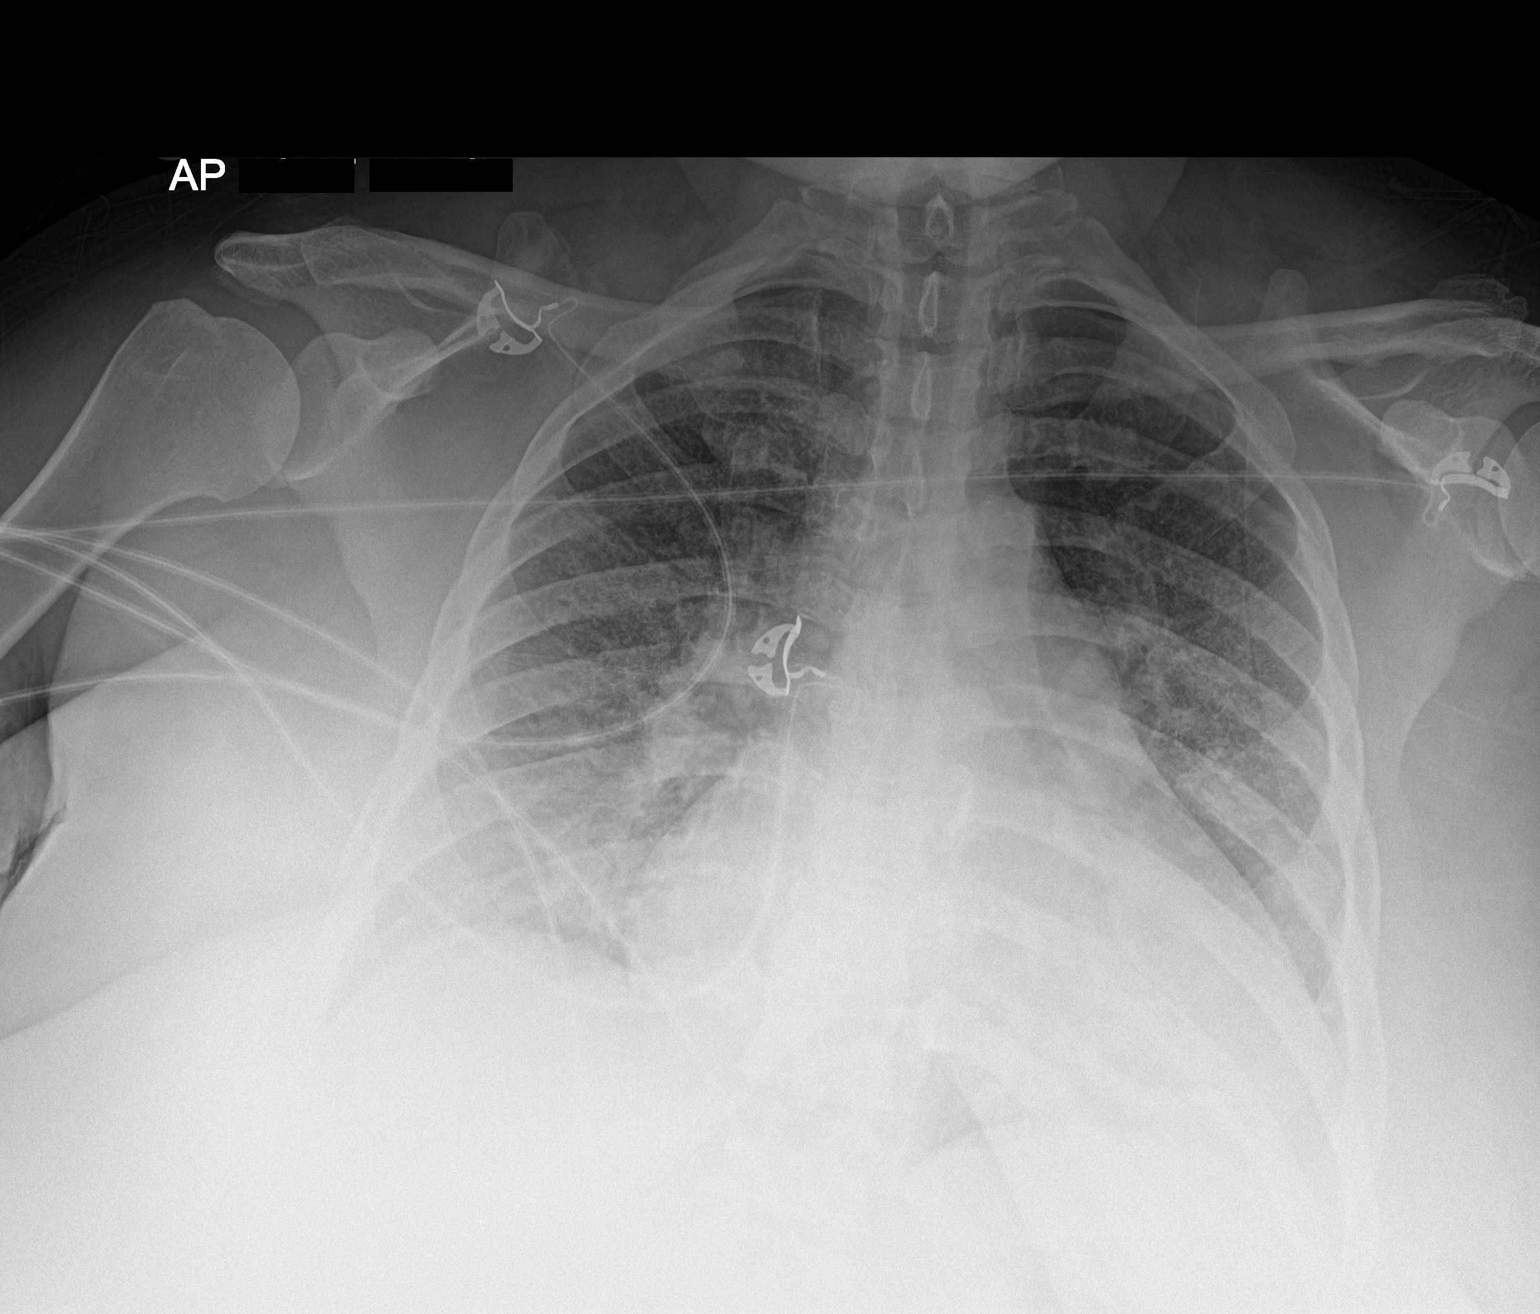

[1 of 1 positions shown; findings below may reference images not displayed]

FINDINGS: Stable cardiomediastinal silhouette with mild cardiomegaly. No
pneumothorax. No pleural effusion. Diffuse hazy and linear parahilar
opacities in both lungs.
IMPRESSION: Stable cardiomegaly with hazy and linear parahilar opacities
throughout both lungs, most compatible with moderate congestive
heart failure.

## 2019-05-09 ENCOUNTER — Telehealth: Payer: Self-pay | Admitting: Pulmonary Disease

## 2019-05-09 DIAGNOSIS — J849 Interstitial pulmonary disease, unspecified: Secondary | ICD-10-CM

## 2019-05-10 ENCOUNTER — Ambulatory Visit: Payer: Medicaid Other | Admitting: Pulmonary Disease

## 2019-05-12 ENCOUNTER — Telehealth: Payer: Self-pay

## 2019-05-12 NOTE — Telephone Encounter (Signed)
I'm fine with it!

## 2019-05-12 NOTE — Telephone Encounter (Signed)
Pt wants to know if the PFT can be done at the hospital.  Is this ok or should pt wait until our next available appt here?

## 2019-05-12 NOTE — Telephone Encounter (Signed)
I have not met this patient before.  I would ask BW who saw the patient last.  Thanks Garner Nash, DO [facility] 05/12/2019 12:59 PM

## 2019-05-12 NOTE — Telephone Encounter (Signed)
I ordered the PFT to be done at [facility]  Will forward to [place] to make her aware  Thanks

## 2019-05-12 NOTE — Telephone Encounter (Signed)
LM for Katie to schedule.

## 2019-05-12 NOTE — Telephone Encounter (Signed)
BW please advise.

## 2019-05-12 NOTE — Telephone Encounter (Signed)
Great thanks Garner Nash, DO [facility] 05/12/2019 2:14 PM

## 2019-05-12 NOTE — Telephone Encounter (Signed)
Message routed to Dr. Valeta Harms to advise.

## 2019-05-12 NOTE — Telephone Encounter (Signed)
Yes that should be fine

## 2019-05-15 NOTE — Telephone Encounter (Signed)
COVID:  11/20 @ 10:30 AM PFT @ [facility]:  11/24 @ 11 AM ROV w/ Dr. Valeta Harms:  11/25 @ 9:45 AM  Left detailed message on pt's VM & mailed appt info to pt.  Nothing further needed at this time.

## 2019-05-19 ENCOUNTER — Other Ambulatory Visit (HOSPITAL_COMMUNITY): Payer: Medicaid Other

## 2019-05-23 ENCOUNTER — Encounter (HOSPITAL_COMMUNITY): Payer: Medicaid Other

## 2019-05-24 ENCOUNTER — Ambulatory Visit: Payer: Medicaid Other | Admitting: Pulmonary Disease

## 2019-06-02 ENCOUNTER — Ambulatory Visit: Payer: Medicaid Other | Admitting: Cardiology

## 2019-06-19 ENCOUNTER — Encounter (HOSPITAL_COMMUNITY): Payer: Self-pay | Admitting: Emergency Medicine

## 2019-06-19 ENCOUNTER — Other Ambulatory Visit: Payer: Self-pay

## 2019-06-19 ENCOUNTER — Emergency Department (HOSPITAL_COMMUNITY)
Admission: EM | Admit: 2019-06-19 | Discharge: 2019-06-19 | Disposition: A | Discharge status: Home / Self Care | Payer: Medicaid Other | Attending: Emergency Medicine | Admitting: Emergency Medicine

## 2019-06-19 ENCOUNTER — Emergency Department (HOSPITAL_COMMUNITY): Payer: Medicaid Other

## 2019-06-19 DIAGNOSIS — Z8673 Personal history of transient ischemic attack (TIA), and cerebral infarction without residual deficits: Secondary | ICD-10-CM | POA: Insufficient documentation

## 2019-06-19 DIAGNOSIS — Y92009 Unspecified place in unspecified non-institutional (private) residence as the place of occurrence of the external cause: Secondary | ICD-10-CM | POA: Diagnosis not present

## 2019-06-19 DIAGNOSIS — S82832A Other fracture of upper and lower end of left fibula, initial encounter for closed fracture: Secondary | ICD-10-CM | POA: Insufficient documentation

## 2019-06-19 DIAGNOSIS — W1839XA Other fall on same level, initial encounter: Secondary | ICD-10-CM | POA: Diagnosis not present

## 2019-06-19 DIAGNOSIS — F17210 Nicotine dependence, cigarettes, uncomplicated: Secondary | ICD-10-CM | POA: Insufficient documentation

## 2019-06-19 DIAGNOSIS — Y999 Unspecified external cause status: Secondary | ICD-10-CM | POA: Insufficient documentation

## 2019-06-19 DIAGNOSIS — Z79899 Other long term (current) drug therapy: Secondary | ICD-10-CM | POA: Diagnosis not present

## 2019-06-19 DIAGNOSIS — I5032 Chronic diastolic (congestive) heart failure: Secondary | ICD-10-CM | POA: Diagnosis not present

## 2019-06-19 DIAGNOSIS — S8992XA Unspecified injury of left lower leg, initial encounter: Secondary | ICD-10-CM | POA: Diagnosis present

## 2019-06-19 DIAGNOSIS — Y9389 Activity, other specified: Secondary | ICD-10-CM | POA: Insufficient documentation

## 2019-06-19 DIAGNOSIS — I110 Hypertensive heart disease with heart failure: Secondary | ICD-10-CM | POA: Insufficient documentation

## 2019-06-19 DIAGNOSIS — W19XXXA Unspecified fall, initial encounter: Secondary | ICD-10-CM

## 2019-06-19 NOTE — ED Notes (Signed)
PTAR called

## 2019-06-19 NOTE — ED Notes (Signed)
Assisted pt to get out of bed and walk with walker as she does at home the same distance she would walk from couch (where she sleeps) to bathroom at home. Able to perform this task independently and without buckling of L knee. Pt counseled about wearing shoes with heel of shoes worn correctly for safety by MD. Nonstick socks given to pt to take home

## 2019-06-19 NOTE — ED Notes (Addendum)
Ortho paged for application of knee immobilizer

## 2019-06-19 NOTE — ED Triage Notes (Signed)
Per PTAR patient coming from home after having a fall this morning. C/o left knee and ankle pain. Patient has appt with orthopedics on 29th after falling last week. Patient uses walker at home. On 4L [place] continuously.

## 2019-06-19 NOTE — ED Provider Notes (Signed)
[facility] EMERGENCY DEPARTMENT Provider Note   CSN: [id number] Arrival date & time: 06/19/19  1018     History Chief Complaint  Patient presents with  . Fall    Diane Gilbert is a 53 y.o. female.  The history is provided by the patient and medical records. No language interpreter was used.  Fall   Diane Gilbert is a 53 y.o. female who presents to the Emergency Department complaining of fall, leg pain. She presents the emergency department by EMS for evaluation of injuries following a fall. She lives at home alone and uses a walker and a cane for ambulation. She was walking when her left leg gave out and she fell, landing on her knee and twisting her ankle. She already had pain in her left knee from a fall that occurred one week ago in a similar fashion. She is oxygen dependent on 4 L nasal cannula and continues to smoke. She denies any recent illnesses or medication changes. She has chronic shortness of breath, which is unchanged from her baseline. She denies any fevers, abdominal pain, nausea, vomiting. Symptoms are moderate and constant in nature. She was planning to get orthopedics evaluation of her knee pain but she was unable to make it to the appointment. Next appointment scheduled for December 29.    Past Medical History:  Diagnosis Date  . Acute on chronic respiratory failure with hypoxia ([place]) 06/07/2014  . Acute respiratory failure with hypoxia ([place]) 03/23/2017  . Alcohol abuse   . Anxiety   . Arthritis   . CHF (congestive heart failure) ([place])   . Community acquired pneumonia   . Depression   . Headache(784.0)   . Hypertension   . Insomnia   . Interstitial lung disease ([place])   . Oxygen dependent   . Respiratory failure, acute and chronic ([place]) 05/09/2017  . Schizophrenia ([place])   . Shortness of breath dyspnea   . Sleep apnea   . Sleep apnea     Patient Active Problem List   Diagnosis Date Noted  . Hx of sleep apnea 03/29/2019    . Status epilepticus ([place]) 03/29/2019  . Acute diastolic CHF (congestive heart failure) ([place]) 03/08/2019  . Acute encephalopathy 03/07/2019  . Ischemic stroke ([place]) 03/07/2019  . Acute respiratory failure with hypoxia ([place]) 02/26/2019  . Falls 02/06/2019  . Depression 02/06/2019  . Acute on chronic diastolic CHF (congestive heart failure) ([place]) 05/09/2017  . Acute on chronic respiratory failure with hypoxia and hypercapnia ([place]) 05/09/2017  . Facial swelling 03/23/2017  . Chronic respiratory failure with hypoxia ([place]) 03/23/2017  . OSA (obstructive sleep apnea) 07/21/2016  . Acute diastolic congestive heart failure ([place])   . Schizophrenia ([place]) 03/24/2016  . Leg swelling 01/16/2016  . Interstitial lung disease ([place]) 07/16/2014  . Respiratory bronchiolitis interstitial lung disease ([place]) 12/26/2013  . Irregular menstrual cycle 01/23/2013  . Smoking addiction 01/23/2013  . Alcohol abuse 02/19/2012  . Hyponatremia 02/19/2012  . Abdominal pain 02/19/2012  . Hypokalemia 02/19/2012    Past Surgical History:  Procedure Laterality Date  . BACK SURGERY    . LUNG BIOPSY Left 07/16/2014   Procedure: LUNG BIOPSY;  Surgeon: Grace Isaac, MD;  Location: [facility];  Service: Thoracic;  Laterality: Left;  Marland Kitchen MULTIPLE TOOTH EXTRACTIONS    . RIGHT/LEFT HEART CATH AND CORONARY ANGIOGRAPHY N/A 01/04/2018   Procedure: RIGHT/LEFT HEART CATH AND CORONARY ANGIOGRAPHY;  Surgeon: Adrian Prows, MD;  Location: [facility];  Service: Cardiovascular;  Laterality: N/A;  . VIDEO ASSISTED THORACOSCOPY Left 07/16/2014   Procedure: VIDEO ASSISTED THORACOSCOPY;  Surgeon: Grace Isaac, MD;  Location: [facility];  Service: Thoracic;  Laterality: Left;  Marland Kitchen VIDEO BRONCHOSCOPY Bilateral 04/11/2014   Procedure: VIDEO BRONCHOSCOPY WITH FLUORO;  Surgeon: Kathee Delton, MD;  Location: [facility];  Service: Cardiopulmonary;  Laterality: Bilateral;  . VIDEO BRONCHOSCOPY N/A 07/16/2014   Procedure: VIDEO BRONCHOSCOPY;   Surgeon: Grace Isaac, MD;  Location: [facility] OR;  Service: Thoracic;  Laterality: N/A;  . WEDGE RESECTION Left 07/16/2014   Procedure: WEDGE RESECTION;  Surgeon: Grace Isaac, MD;  Location: [facility];  Service: Thoracic;  Laterality: Left;  left upper lobe lung resection     OB History    Gravida  0   Para  0   Term  0   Preterm  0   AB  0   Living  0     SAB  0   TAB  0   Ectopic  0   Multiple  0   Live Births              Family History  Problem Relation Age of Onset  . Breast cancer Mother   . Obstructive Sleep Apnea Mother   . COPD Maternal Aunt     Social History   Tobacco Use  . Smoking status: Current Every Day Smoker    Packs/day: 1.50    Years: 30.00    Pack years: 45.00    Types: Cigarettes  . Smokeless tobacco: Never Used  . Tobacco comment: smoking 3 cigarettes daily "every now and then" 11/04/16  Substance Use Topics  . Alcohol use: Yes    Alcohol/week: 0.0 standard drinks    Comment: occasional   . Drug use: Yes    Types: Cocaine    Home Medications Prior to Admission medications   Medication Sig Start Date End Date Taking? Authorizing Provider  albuterol (PROVENTIL HFA;VENTOLIN HFA) 108 (90 BASE) MCG/ACT inhaler Inhale 2 puffs into the lungs every 6 (six) hours as needed for wheezing or shortness of breath. 05/09/15   Juanito Doom, MD  albuterol (PROVENTIL) (2.5 MG/3ML) 0.083% nebulizer solution Take 3 mLs (2.5 mg total) by nebulization every 6 (six) hours as needed for wheezing or shortness of breath. 02/06/19   Martyn Ehrich, NP  ARIPiprazole (ABILIFY) 5 MG tablet Take 5 mg by mouth daily. 02/23/19   [provider]  budesonide-formoterol (SYMBICORT) 160-4.5 MCG/ACT inhaler INHALE TWO puffs into THE lungs TWICE DAILY 02/06/19   Martyn Ehrich, NP  diclofenac sodium (VOLTAREN) 1 % GEL Apply 2 g topically 4 (four) times daily.    [provider]  furosemide (LASIX) 40 MG tablet Take 1 tablet (40 mg total)  by mouth 2 (two) times daily. 03/12/19   Lavina Hamman, MD  gabapentin (NEURONTIN) 300 MG capsule Take 300 mg by mouth 3 (three) times daily.    [provider]  levETIRAcetam (KEPPRA) 1000 MG tablet Take 1 tablet (1,000 mg total) by mouth 2 (two) times daily. 04/03/19   Harold Hedge, MD  losartan (COZAAR) 50 MG tablet Take 1 tablet (50 mg total) by mouth daily. 03/02/19   Debbe Odea, MD  OXYGEN Inhale 4 L into the lungs continuous.     [provider]  polyethylene glycol (MIRALAX / GLYCOLAX) 17 g packet Take 17 g by mouth daily. 03/12/19   Lavina Hamman, MD  QUEtiapine (SEROQUEL) 100 MG tablet  Take 1 tablet (100 mg total) by mouth 2 (two) times daily. 04/03/19   Harold Hedge, MD  rosuvastatin (CRESTOR) 10 MG tablet Take 1 tablet (10 mg total) by mouth daily. 01/17/18   Blanchie Dessert, MD  tiZANidine (ZANAFLEX) 4 MG capsule Take 4 mg by mouth 2 (two) times daily as needed for muscle spasms.     [provider]  traZODone (DESYREL) 100 MG tablet Take 100 mg by mouth at bedtime.    [provider]    Allergies    Unasyn [ampicillin-sulbactam sodium], Contrast media [iodinated diagnostic agents], and Hydralazine  Review of Systems   Review of Systems  All other systems reviewed and are negative.   Physical Exam Updated Vital Signs BP (!) 168/81 (BP Location: Left Arm)   Pulse 89   Temp 98 F (36.7 C) (Oral)   Resp 20   LMP 04/06/2017   SpO2 93%   Physical Exam Vitals and nursing note reviewed.  Constitutional:      Appearance: She is well-developed.     Comments: Chronically ill appearing  HENT:     Head: Normocephalic and atraumatic.  Cardiovascular:     Rate and Rhythm: Normal rate and regular rhythm.     Heart sounds: No murmur.  Pulmonary:     Effort: Pulmonary effort is normal. No respiratory distress.     Breath sounds: Normal breath sounds.  Abdominal:     Palpations: Abdomen is soft.     Tenderness: There is no abdominal  tenderness. There is no guarding or rebound.  Musculoskeletal:     Comments: 2+ DP pulses. There is tenderness to palpation over the entire left knee with flexion extension intact. There is tenderness to palpation throughout the left shin and calf as well as the left ankle. There is mild swelling to the left ankle. Dorsiflexion and plantar flexion are intact but diminished secondary to pain. There is no point tenderness in the right lower extremity, range of motion is intact. There is no hip or pelvic tenderness to palpation.  Skin:    General: Skin is warm and dry.  Neurological:     Mental Status: She is alert and oriented to person, place, and time.  Psychiatric:        Behavior: Behavior normal.     ED Results / Procedures / Treatments   Labs (all labs ordered are listed, but only abnormal results are displayed) Labs Reviewed - No data to display  EKG None  Radiology DG Tibia/Fibula Left  Result Date: 06/19/2019 CLINICAL DATA:  53 year old female status post fall 2 days ago with continued pain. EXAM: LEFT TIBIA AND FIBULA - 2 VIEW COMPARISON:  Left ankle series today. FINDINGS: AP and cross-table lateral views. There is a mildly comminuted spiral fracture of the proximal left fibula extending from the fibular head into the proximal shaft. Slight anterior displacement. The tibia appears intact. The mid and distal fibula appear intact. Alignment at the ankle appears preserved. Alignment at the knee appears preserved although there is a medial distal femoral metadiaphysis bone fragment. See left knee series. IMPRESSION: 1. Comminuted spiral fracture of the proximal left fibula. Slight anterior displacement. 2. Medial distal femur bone fragment, see left knee series reported separately. Electronically Signed   By: Genevie Ann M.D.   On: 06/19/2019 11:32   DG Ankle Complete Left  Result Date: 06/19/2019 CLINICAL DATA:  53 year old female status post fall 2 days ago with continued pain. EXAM:  LEFT ANKLE COMPLETE -  3+ VIEW COMPARISON:  None. FINDINGS: Bone mineralization is within normal limits. Preserved mortise joint alignment. No joint effusion. Talar dome intact. Distal tibia and fibula appear intact. Calcaneus and other visible bones of the left foot appear intact. There is anterior soft tissue swelling suspected on the cross-table lateral view. IMPRESSION: Anterior soft tissue swelling with no acute fracture or dislocation identified about the left ankle. Electronically Signed   By: Genevie Ann M.D.   On: 06/19/2019 11:28   DG Knee Complete 4 Views Left  Result Date: 06/19/2019 CLINICAL DATA:  52 year old female status post fall 2 days ago with continued pain. EXAM: LEFT KNEE - COMPLETE 4+ VIEW COMPARISON:  Left tib-fib series today. FINDINGS: Comminuted spiral fracture of the proximal fibula with slight anterior displacement again noted. The proximal tibia appears intact. There is a 15 mm linear bone fragment adjacent to the proximal aspect of the medial distal femoral condyle. On the cross-table lateral view no joint effusion is identified. The patella appears intact. The remaining distal femur appears intact. IMPRESSION: 1. Acute 15 mm bone fragment from the medial distal femur suggesting MCL avulsion. 2. Superimposed comminuted spiral fracture of the proximal fibula. 3. No tibia fracture or joint effusion identified. Electronically Signed   By: Genevie Ann M.D.   On: 06/19/2019 11:39    Procedures Procedures (including critical care time)  Medications Ordered in ED Medications - No data to display  ED Course  I have reviewed the triage vital signs and the nursing notes.  Pertinent labs & imaging results that were available during my care of the patient were reviewed by me and considered in my medical decision making (see chart for details).    MDM Rules/Calculators/A&P                      Patient here for evaluation of left knee pain. She initially had pain due to a fall one week  ago and then had a recurrent fall today due to feeling her knee buckled. She does have tenderness on palpation but is able to range the knee. Images are significant for a left proximal fibula fracture as well as a possible MCL tear. Patient states that she had an outpatient image that demonstrated the fibula fracture previously and that she is scheduled to see orthopedics next week. Will place in any immobilizer for comfort and stability. Social work consulted for home health resources. Plan to discharge patient home with outpatient orthopedics follow-up as well as home health. Final Clinical Impression(s) / ED Diagnoses Final diagnoses:  Fall, initial encounter  Other closed fracture of proximal end of left fibula, initial encounter    Rx / DC Orders ED Discharge Orders    None       Quintella Reichert, MD 06/19/19 1609

## 2019-06-19 NOTE — ED Notes (Signed)
All appropriate discharge materials reviewed at length with patient. Time for questions provided. Pt has no other questions at this time and verbalizes understanding of all provided materials.

## 2019-06-19 NOTE — ED Notes (Signed)
Nurse Navigator rounded on pt and checked with MD regarding diet.  Pt declined help in communicating and updating family.  Pt appears comfortable, resting with warm blankets in place

## 2019-06-19 NOTE — Progress Notes (Signed)
Orthopedic Tech Progress Note Patient Details:  Diane Gilbert [birth year] [id number]  Ortho Devices Type of Ortho Device: Knee Immobilizer Ortho Device/Splint Interventions: Application   Post Interventions Patient Tolerated: Well Instructions Provided: Care of device   [name] 06/19/2019, 12:30 PM

## 2019-06-19 NOTE — ED Notes (Signed)
Happy meal and drink provided

## 2019-06-19 NOTE — Discharge Instructions (Signed)
Please follow up with your Orthopedic Surgeon (or Dr. Mardelle Matte).  Wear the knee immobilizer when walking.

## 2019-07-02 ENCOUNTER — Inpatient Hospital Stay (HOSPITAL_COMMUNITY)
Admission: EM | Admit: 2019-07-02 | Discharge: 2019-07-12 | DRG: 193 | Disposition: A | Discharge status: Home Health | Payer: Medicaid Other | Attending: Internal Medicine | Admitting: Internal Medicine

## 2019-07-02 ENCOUNTER — Ambulatory Visit (INDEPENDENT_AMBULATORY_CARE_PROVIDER_SITE_OTHER)
Admission: EM | Admit: 2019-07-02 | Discharge: 2019-07-02 | Disposition: A | Discharge status: Home / Self Care | Payer: Medicaid Other | Source: Home / Self Care

## 2019-07-02 ENCOUNTER — Emergency Department (HOSPITAL_COMMUNITY): Payer: Medicaid Other

## 2019-07-02 ENCOUNTER — Other Ambulatory Visit: Payer: Self-pay

## 2019-07-02 ENCOUNTER — Encounter (HOSPITAL_COMMUNITY): Payer: Self-pay | Admitting: *Deleted

## 2019-07-02 DIAGNOSIS — Z20822 Contact with and (suspected) exposure to covid-19: Secondary | ICD-10-CM | POA: Diagnosis present

## 2019-07-02 DIAGNOSIS — Z8673 Personal history of transient ischemic attack (TIA), and cerebral infarction without residual deficits: Secondary | ICD-10-CM

## 2019-07-02 DIAGNOSIS — G40909 Epilepsy, unspecified, not intractable, without status epilepticus: Secondary | ICD-10-CM

## 2019-07-02 DIAGNOSIS — R739 Hyperglycemia, unspecified: Secondary | ICD-10-CM | POA: Diagnosis present

## 2019-07-02 DIAGNOSIS — I959 Hypotension, unspecified: Secondary | ICD-10-CM | POA: Diagnosis not present

## 2019-07-02 DIAGNOSIS — I5033 Acute on chronic diastolic (congestive) heart failure: Secondary | ICD-10-CM | POA: Diagnosis present

## 2019-07-02 DIAGNOSIS — E872 Acidosis: Secondary | ICD-10-CM | POA: Diagnosis not present

## 2019-07-02 DIAGNOSIS — Z825 Family history of asthma and other chronic lower respiratory diseases: Secondary | ICD-10-CM

## 2019-07-02 DIAGNOSIS — G9341 Metabolic encephalopathy: Secondary | ICD-10-CM | POA: Diagnosis not present

## 2019-07-02 DIAGNOSIS — T380X5A Adverse effect of glucocorticoids and synthetic analogues, initial encounter: Secondary | ICD-10-CM | POA: Diagnosis present

## 2019-07-02 DIAGNOSIS — Z888 Allergy status to other drugs, medicaments and biological substances status: Secondary | ICD-10-CM

## 2019-07-02 DIAGNOSIS — R9431 Abnormal electrocardiogram [ECG] [EKG]: Secondary | ICD-10-CM

## 2019-07-02 DIAGNOSIS — Z9981 Dependence on supplemental oxygen: Secondary | ICD-10-CM

## 2019-07-02 DIAGNOSIS — Z803 Family history of malignant neoplasm of breast: Secondary | ICD-10-CM

## 2019-07-02 DIAGNOSIS — Z79899 Other long term (current) drug therapy: Secondary | ICD-10-CM

## 2019-07-02 DIAGNOSIS — S82832D Other fracture of upper and lower end of left fibula, subsequent encounter for closed fracture with routine healing: Secondary | ICD-10-CM

## 2019-07-02 DIAGNOSIS — F17210 Nicotine dependence, cigarettes, uncomplicated: Secondary | ICD-10-CM | POA: Diagnosis present

## 2019-07-02 DIAGNOSIS — Z881 Allergy status to other antibiotic agents status: Secondary | ICD-10-CM

## 2019-07-02 DIAGNOSIS — Z7951 Long term (current) use of inhaled steroids: Secondary | ICD-10-CM

## 2019-07-02 DIAGNOSIS — E876 Hypokalemia: Secondary | ICD-10-CM | POA: Diagnosis not present

## 2019-07-02 DIAGNOSIS — J9611 Chronic respiratory failure with hypoxia: Secondary | ICD-10-CM | POA: Diagnosis present

## 2019-07-02 DIAGNOSIS — R0789 Other chest pain: Secondary | ICD-10-CM

## 2019-07-02 DIAGNOSIS — N179 Acute kidney failure, unspecified: Secondary | ICD-10-CM | POA: Diagnosis not present

## 2019-07-02 DIAGNOSIS — I110 Hypertensive heart disease with heart failure: Secondary | ICD-10-CM | POA: Diagnosis present

## 2019-07-02 DIAGNOSIS — W19XXXD Unspecified fall, subsequent encounter: Secondary | ICD-10-CM

## 2019-07-02 DIAGNOSIS — Z6841 Body Mass Index (BMI) 40.0 and over, adult: Secondary | ICD-10-CM

## 2019-07-02 DIAGNOSIS — R0902 Hypoxemia: Secondary | ICD-10-CM

## 2019-07-02 DIAGNOSIS — R0602 Shortness of breath: Secondary | ICD-10-CM

## 2019-07-02 DIAGNOSIS — J439 Emphysema, unspecified: Secondary | ICD-10-CM | POA: Diagnosis present

## 2019-07-02 DIAGNOSIS — T502X5A Adverse effect of carbonic-anhydrase inhibitors, benzothiadiazides and other diuretics, initial encounter: Secondary | ICD-10-CM | POA: Diagnosis not present

## 2019-07-02 DIAGNOSIS — R079 Chest pain, unspecified: Secondary | ICD-10-CM

## 2019-07-02 DIAGNOSIS — J9621 Acute and chronic respiratory failure with hypoxia: Secondary | ICD-10-CM | POA: Diagnosis present

## 2019-07-02 DIAGNOSIS — F209 Schizophrenia, unspecified: Secondary | ICD-10-CM | POA: Diagnosis present

## 2019-07-02 DIAGNOSIS — J84115 Respiratory bronchiolitis interstitial lung disease: Secondary | ICD-10-CM | POA: Diagnosis present

## 2019-07-02 DIAGNOSIS — J189 Pneumonia, unspecified organism: Principal | ICD-10-CM | POA: Diagnosis present

## 2019-07-02 DIAGNOSIS — E662 Morbid (severe) obesity with alveolar hypoventilation: Secondary | ICD-10-CM | POA: Diagnosis present

## 2019-07-02 DIAGNOSIS — J9690 Respiratory failure, unspecified, unspecified whether with hypoxia or hypercapnia: Secondary | ICD-10-CM

## 2019-07-02 DIAGNOSIS — M1990 Unspecified osteoarthritis, unspecified site: Secondary | ICD-10-CM | POA: Diagnosis present

## 2019-07-02 DIAGNOSIS — J9622 Acute and chronic respiratory failure with hypercapnia: Secondary | ICD-10-CM | POA: Diagnosis present

## 2019-07-02 DIAGNOSIS — J9601 Acute respiratory failure with hypoxia: Secondary | ICD-10-CM | POA: Diagnosis present

## 2019-07-02 DIAGNOSIS — Z91041 Radiographic dye allergy status: Secondary | ICD-10-CM

## 2019-07-02 HISTORY — DX: Chronic diastolic (congestive) heart failure: I50.32

## 2019-07-02 HISTORY — DX: Cerebral infarction, unspecified: I63.9

## 2019-07-02 HISTORY — DX: Unspecified convulsions: R56.9

## 2019-07-02 HISTORY — DX: Respiratory bronchiolitis interstitial lung disease: J84.115

## 2019-07-02 LAB — CBC
HCT: 50.6 % — ABNORMAL HIGH (ref 36.0–46.0)
Hemoglobin: 13.8 g/dL (ref 12.0–15.0)
MCH: 27.9 pg (ref 26.0–34.0)
MCHC: 27.3 g/dL — ABNORMAL LOW (ref 30.0–36.0)
MCV: 102.2 fL — ABNORMAL HIGH (ref 80.0–100.0)
Platelets: 210 10*3/uL (ref 150–400)
RBC: 4.95 MIL/uL (ref 3.87–5.11)
RDW: 15.2 % (ref 11.5–15.5)
WBC: 8.6 10*3/uL (ref 4.0–10.5)
nRBC: 1.2 % — ABNORMAL HIGH (ref 0.0–0.2)

## 2019-07-02 LAB — I-STAT BETA HCG BLOOD, ED (MC, WL, AP ONLY): I-stat hCG, quantitative: <5 m[IU]/mL (ref <5)

## 2019-07-02 LAB — BASIC METABOLIC PANEL
Anion gap: 6 (ref 5–15)
BUN: 7 mg/dL (ref 6–20)
CO2: 39 mmol/L — ABNORMAL HIGH (ref 22–32)
Calcium: 8.8 mg/dL — ABNORMAL LOW (ref 8.9–10.3)
Chloride: 93 mmol/L — ABNORMAL LOW (ref 98–111)
Creatinine, Ser: 0.7 mg/dL (ref 0.44–1.00)
GFR calc Af Amer: >60 mL/min (ref >60)
GFR calc non Af Amer: >60 mL/min (ref >60)
Glucose, Bld: 149 mg/dL — ABNORMAL HIGH (ref 70–99)
Potassium: 4.1 mmol/L (ref 3.5–5.1)
Sodium: 138 mmol/L (ref 135–145)

## 2019-07-02 LAB — TROPONIN I (HIGH SENSITIVITY): Troponin I (High Sensitivity): 6 ng/L (ref <18)

## 2019-07-02 MED ORDER — SODIUM CHLORIDE 0.9% FLUSH
3.0000 mL | Freq: Once | INTRAVENOUS | Status: AC
  Administered 2019-07-09: 23:00:00 3 mL via INTRAVENOUS

## 2019-07-02 NOTE — ED Notes (Signed)
Patient is being discharged from the Urgent [facility] and sent to the Emergency Department via wheelchair by staff. Per dr Meda Coffee, patient is stable but in need of higher level of care due to chest pain. Patient is aware and verbalizes understanding of plan of care.  Vitals:   07/02/19 1658  BP: 136/63  Pulse: (!) 119  Resp: 20  Temp: 98.2 F (36.8 C)  SpO2: 94%

## 2019-07-02 NOTE — ED Notes (Addendum)
Diane Gilbert, cma taking patient to  ED, states family present

## 2019-07-02 NOTE — ED Provider Notes (Signed)
Patient presented to the Urgent care for evaluation of chest pain Described as "severe' Patient broke her leg approx 2 weeks ago and is immobilized She has lung disease, history of respiratory failure, oxygen dependent History of heart disease and CHF History of seizure disorder, mental illness  EKG is abnormal, but reasonably unchanged Explained 2 h wait in the UC Patient better served in the ER since she is concerned about heart disease YSN   Raylene Everts, MD 07/02/19 1701

## 2019-07-02 NOTE — ED Notes (Signed)
EMT Natalie asked this NT to verify pulse ox on pt who was sitting in the waiting room post triage. Upon approaching the pt we noticed that pts O2 Sats were in the 20's with good pleth on the dinamap. The pulse ox was switched multiple times and the results were the same. Pt stated she does use Oxygen at home and was currently using her own o2 tank. When we verified the O2 tank it was on empty. This NT quickly went and got an O2 tank and placed the pt on O2. This NT brought this to the attention of Hipolito Bayley, RN. Pt O2 is currently 94% on 6L.

## 2019-07-02 NOTE — ED Notes (Signed)
Pt was initially triaged without any recorded vital signs. Pt was returned to lobby after triaging. Upon arrival to pt, pt asked me if I would check her O2 saturation. Pt's saturation read at 25% with good waveform. Hennie Duos, [place] working in [employer], came over to assist me. We realized her O2 tank from home was completely empty for an unknown amount of time. Once Hennie Duos got a full )2 tank, pt's O2 saturation dropped to 19% with good waveform. Pt was placed on 6 L/nasal cannula and reached 94%. Nurse, Karolee Stamps, was made aware of this after pt's O2 saturation was increased and returned to a safe level.

## 2019-07-02 NOTE — ED Triage Notes (Signed)
Pt is here with chest pain to mid chest that started last night without radiation.  Pt is on home 02 due to a lung surgery she had in 2015.  Pt denies fever or near any covid patients.

## 2019-07-02 NOTE — ED Triage Notes (Signed)
Pt states she has a pressure in the center of her chest it started today.

## 2019-07-03 ENCOUNTER — Encounter (HOSPITAL_COMMUNITY): Payer: Self-pay

## 2019-07-03 ENCOUNTER — Emergency Department (HOSPITAL_COMMUNITY): Payer: Medicaid Other

## 2019-07-03 DIAGNOSIS — J810 Acute pulmonary edema: Secondary | ICD-10-CM | POA: Diagnosis not present

## 2019-07-03 DIAGNOSIS — Z7951 Long term (current) use of inhaled steroids: Secondary | ICD-10-CM | POA: Diagnosis not present

## 2019-07-03 DIAGNOSIS — M1990 Unspecified osteoarthritis, unspecified site: Secondary | ICD-10-CM | POA: Diagnosis present

## 2019-07-03 DIAGNOSIS — J9601 Acute respiratory failure with hypoxia: Secondary | ICD-10-CM

## 2019-07-03 DIAGNOSIS — F17210 Nicotine dependence, cigarettes, uncomplicated: Secondary | ICD-10-CM | POA: Diagnosis present

## 2019-07-03 DIAGNOSIS — F209 Schizophrenia, unspecified: Secondary | ICD-10-CM | POA: Diagnosis present

## 2019-07-03 DIAGNOSIS — G9341 Metabolic encephalopathy: Secondary | ICD-10-CM | POA: Diagnosis not present

## 2019-07-03 DIAGNOSIS — G9340 Encephalopathy, unspecified: Secondary | ICD-10-CM | POA: Diagnosis not present

## 2019-07-03 DIAGNOSIS — N179 Acute kidney failure, unspecified: Secondary | ICD-10-CM | POA: Diagnosis not present

## 2019-07-03 DIAGNOSIS — W19XXXD Unspecified fall, subsequent encounter: Secondary | ICD-10-CM | POA: Diagnosis not present

## 2019-07-03 DIAGNOSIS — J9621 Acute and chronic respiratory failure with hypoxia: Secondary | ICD-10-CM | POA: Diagnosis not present

## 2019-07-03 DIAGNOSIS — M79609 Pain in unspecified limb: Secondary | ICD-10-CM

## 2019-07-03 DIAGNOSIS — E872 Acidosis: Secondary | ICD-10-CM | POA: Diagnosis not present

## 2019-07-03 DIAGNOSIS — J189 Pneumonia, unspecified organism: Secondary | ICD-10-CM | POA: Diagnosis present

## 2019-07-03 DIAGNOSIS — Z888 Allergy status to other drugs, medicaments and biological substances status: Secondary | ICD-10-CM | POA: Diagnosis not present

## 2019-07-03 DIAGNOSIS — J811 Chronic pulmonary edema: Secondary | ICD-10-CM | POA: Diagnosis not present

## 2019-07-03 DIAGNOSIS — Z79899 Other long term (current) drug therapy: Secondary | ICD-10-CM | POA: Diagnosis not present

## 2019-07-03 DIAGNOSIS — Z8673 Personal history of transient ischemic attack (TIA), and cerebral infarction without residual deficits: Secondary | ICD-10-CM | POA: Diagnosis not present

## 2019-07-03 DIAGNOSIS — I110 Hypertensive heart disease with heart failure: Secondary | ICD-10-CM | POA: Diagnosis present

## 2019-07-03 DIAGNOSIS — J9622 Acute and chronic respiratory failure with hypercapnia: Secondary | ICD-10-CM | POA: Diagnosis not present

## 2019-07-03 DIAGNOSIS — J9602 Acute respiratory failure with hypercapnia: Secondary | ICD-10-CM | POA: Diagnosis not present

## 2019-07-03 DIAGNOSIS — Z91041 Radiographic dye allergy status: Secondary | ICD-10-CM | POA: Diagnosis not present

## 2019-07-03 DIAGNOSIS — R0602 Shortness of breath: Secondary | ICD-10-CM | POA: Diagnosis present

## 2019-07-03 DIAGNOSIS — G40909 Epilepsy, unspecified, not intractable, without status epilepticus: Secondary | ICD-10-CM | POA: Diagnosis present

## 2019-07-03 DIAGNOSIS — I2722 Pulmonary hypertension due to left heart disease: Secondary | ICD-10-CM | POA: Diagnosis not present

## 2019-07-03 DIAGNOSIS — J439 Emphysema, unspecified: Secondary | ICD-10-CM | POA: Diagnosis present

## 2019-07-03 DIAGNOSIS — M7989 Other specified soft tissue disorders: Secondary | ICD-10-CM

## 2019-07-03 DIAGNOSIS — I5033 Acute on chronic diastolic (congestive) heart failure: Secondary | ICD-10-CM | POA: Diagnosis not present

## 2019-07-03 DIAGNOSIS — Z825 Family history of asthma and other chronic lower respiratory diseases: Secondary | ICD-10-CM | POA: Diagnosis not present

## 2019-07-03 DIAGNOSIS — Z6841 Body Mass Index (BMI) 40.0 and over, adult: Secondary | ICD-10-CM | POA: Diagnosis not present

## 2019-07-03 DIAGNOSIS — Z20822 Contact with and (suspected) exposure to covid-19: Secondary | ICD-10-CM | POA: Diagnosis present

## 2019-07-03 DIAGNOSIS — I5032 Chronic diastolic (congestive) heart failure: Secondary | ICD-10-CM | POA: Diagnosis not present

## 2019-07-03 DIAGNOSIS — Z9981 Dependence on supplemental oxygen: Secondary | ICD-10-CM | POA: Diagnosis not present

## 2019-07-03 DIAGNOSIS — Z803 Family history of malignant neoplasm of breast: Secondary | ICD-10-CM | POA: Diagnosis not present

## 2019-07-03 DIAGNOSIS — J441 Chronic obstructive pulmonary disease with (acute) exacerbation: Secondary | ICD-10-CM | POA: Diagnosis not present

## 2019-07-03 DIAGNOSIS — E662 Morbid (severe) obesity with alveolar hypoventilation: Secondary | ICD-10-CM | POA: Diagnosis present

## 2019-07-03 DIAGNOSIS — Z881 Allergy status to other antibiotic agents status: Secondary | ICD-10-CM | POA: Diagnosis not present

## 2019-07-03 LAB — BLOOD GAS, ARTERIAL
Acid-Base Excess: 15 mmol/L — ABNORMAL HIGH (ref 0.0–2.0)
Acid-Base Excess: 17.1 mmol/L — ABNORMAL HIGH (ref 0.0–2.0)
Acid-Base Excess: 17.8 mmol/L — ABNORMAL HIGH (ref 0.0–2.0)
Bicarbonate: 44.6 mmol/L — ABNORMAL HIGH (ref 20.0–28.0)
Bicarbonate: 45.3 mmol/L — ABNORMAL HIGH (ref 20.0–28.0)
Bicarbonate: 45.4 mmol/L — ABNORMAL HIGH (ref 20.0–28.0)
Drawn by: 336831
Drawn by: 336832
FIO2: 100
FIO2: 70
FIO2: 60
O2 Saturation: 95.8 %
O2 Saturation: 91.8 %
O2 Saturation: 92.4 %
Patient temperature: 36.7
Patient temperature: 36.7
Patient temperature: 36.7
pCO2 arterial: >120 mmHg (ref 32.0–48.0)
pCO2 arterial: 115 mmHg (ref 32.0–48.0)
pCO2 arterial: 103 mmHg (ref 32.0–48.0)
pH, Arterial: 7.117 — CL (ref 7.350–7.450)
pH, Arterial: 7.216 — ABNORMAL LOW (ref 7.350–7.450)
pH, Arterial: 7.266 — ABNORMAL LOW (ref 7.350–7.450)
pO2, Arterial: 101 mmHg (ref 83.0–108.0)
pO2, Arterial: 70.8 mmHg — ABNORMAL LOW (ref 83.0–108.0)
pO2, Arterial: 65.8 mmHg — ABNORMAL LOW (ref 83.0–108.0)

## 2019-07-03 LAB — TROPONIN I (HIGH SENSITIVITY)
Troponin I (High Sensitivity): 9 ng/L (ref <18)
Troponin I (High Sensitivity): 5 ng/L (ref <18)
Troponin I (High Sensitivity): 7 ng/L (ref <18)

## 2019-07-03 LAB — COMPREHENSIVE METABOLIC PANEL
ALT: 16 U/L (ref 0–44)
AST: 31 U/L (ref 15–41)
Albumin: 3.9 g/dL (ref 3.5–5.0)
Alkaline Phosphatase: 94 U/L (ref 38–126)
Anion gap: 9 (ref 5–15)
BUN: 6 mg/dL (ref 6–20)
CO2: 42 mmol/L — ABNORMAL HIGH (ref 22–32)
Calcium: 8.8 mg/dL — ABNORMAL LOW (ref 8.9–10.3)
Chloride: 92 mmol/L — ABNORMAL LOW (ref 98–111)
Creatinine, Ser: 0.87 mg/dL (ref 0.44–1.00)
GFR calc Af Amer: >60 mL/min (ref >60)
GFR calc non Af Amer: >60 mL/min (ref >60)
Glucose, Bld: 136 mg/dL — ABNORMAL HIGH (ref 70–99)
Potassium: 4.4 mmol/L (ref 3.5–5.1)
Sodium: 143 mmol/L (ref 135–145)
Total Bilirubin: 1.1 mg/dL (ref 0.3–1.2)
Total Protein: 7.9 g/dL (ref 6.5–8.1)

## 2019-07-03 LAB — CBC WITH DIFFERENTIAL/PLATELET
Abs Immature Granulocytes: 0.1 10*3/uL — ABNORMAL HIGH (ref 0.00–0.07)
Basophils Absolute: 0 10*3/uL (ref 0.0–0.1)
Basophils Relative: 0 %
Eosinophils Absolute: 0 10*3/uL (ref 0.0–0.5)
Eosinophils Relative: 0 %
HCT: 51.7 % — ABNORMAL HIGH (ref 36.0–46.0)
Hemoglobin: 13.8 g/dL (ref 12.0–15.0)
Immature Granulocytes: 1 %
Lymphocytes Relative: 20 %
Lymphs Abs: 2.1 10*3/uL (ref 0.7–4.0)
MCH: 27.8 pg (ref 26.0–34.0)
MCHC: 26.7 g/dL — ABNORMAL LOW (ref 30.0–36.0)
MCV: 104.2 fL — ABNORMAL HIGH (ref 80.0–100.0)
Monocytes Absolute: 0.8 10*3/uL (ref 0.1–1.0)
Monocytes Relative: 8 %
Neutro Abs: 7.6 10*3/uL (ref 1.7–7.7)
Neutrophils Relative %: 71 %
Platelets: 216 10*3/uL (ref 150–400)
RBC: 4.96 MIL/uL (ref 3.87–5.11)
RDW: 15.3 % (ref 11.5–15.5)
WBC: 10.7 10*3/uL — ABNORMAL HIGH (ref 4.0–10.5)
nRBC: 0.6 % — ABNORMAL HIGH (ref 0.0–0.2)

## 2019-07-03 LAB — RESPIRATORY PANEL BY RT PCR (FLU A&B, COVID)
Influenza A by PCR: NEGATIVE
Influenza B by PCR: NEGATIVE
SARS Coronavirus 2 by RT PCR: NEGATIVE

## 2019-07-03 LAB — CBG MONITORING, ED
Glucose-Capillary: 98 mg/dL (ref 70–99)
Glucose-Capillary: 110 mg/dL — ABNORMAL HIGH (ref 70–99)

## 2019-07-03 LAB — BRAIN NATRIURETIC PEPTIDE
B Natriuretic Peptide: 232.1 pg/mL — ABNORMAL HIGH (ref 0.0–100.0)
B Natriuretic Peptide: 142 pg/mL — ABNORMAL HIGH (ref 0.0–100.0)

## 2019-07-03 LAB — PHOSPHORUS: Phosphorus: 4.6 mg/dL (ref 2.5–4.6)

## 2019-07-03 LAB — STREP PNEUMONIAE URINARY ANTIGEN: Strep Pneumo Urinary Antigen: POSITIVE — AB

## 2019-07-03 LAB — LACTIC ACID, PLASMA: Lactic Acid, Venous: 1 mmol/L (ref 0.5–1.9)

## 2019-07-03 LAB — MAGNESIUM: Magnesium: 1.8 mg/dL (ref 1.7–2.4)

## 2019-07-03 MED ORDER — FUROSEMIDE 10 MG/ML IJ SOLN
40.0000 mg | Freq: Once | INTRAMUSCULAR | Status: AC
  Administered 2019-07-03: 40 mg via INTRAVENOUS
  Filled 2019-07-03: qty 4

## 2019-07-03 MED ORDER — BUDESONIDE 0.25 MG/2ML IN SUSP
0.2500 mg | Freq: Two times a day (BID) | RESPIRATORY_TRACT | Status: DC
  Administered 2019-07-04 – 2019-07-06 (×5): 0.25 mg via RESPIRATORY_TRACT
  Filled 2019-07-03 (×7): qty 2

## 2019-07-03 MED ORDER — VANCOMYCIN HCL 1750 MG/350ML IV SOLN: 1750.0000 mg | INTRAVENOUS | Status: DC

## 2019-07-03 MED ORDER — IOHEXOL 350 MG/ML SOLN
100.0000 mL | Freq: Once | INTRAVENOUS | Status: AC | PRN
  Administered 2019-07-03: 80 mL via INTRAVENOUS

## 2019-07-03 MED ORDER — SODIUM CHLORIDE 0.9 % IV SOLN
2.0000 g | Freq: Three times a day (TID) | INTRAVENOUS | Status: DC
  Administered 2019-07-03 (×2): 2 g via INTRAVENOUS
  Filled 2019-07-03 (×2): qty 2

## 2019-07-03 MED ORDER — INSULIN ASPART 100 UNIT/ML ~~LOC~~ SOLN
0.0000 [IU] | SUBCUTANEOUS | Status: DC
  Administered 2019-07-04: 7 [IU] via SUBCUTANEOUS
  Administered 2019-07-04 (×3): 3 [IU] via SUBCUTANEOUS
  Administered 2019-07-05: 02:00:00 7 [IU] via SUBCUTANEOUS
  Administered 2019-07-05 (×2): 3 [IU] via SUBCUTANEOUS
  Administered 2019-07-05 (×3): 4 [IU] via SUBCUTANEOUS
  Administered 2019-07-05 – 2019-07-06 (×3): 3 [IU] via SUBCUTANEOUS
  Administered 2019-07-06: 11 [IU] via SUBCUTANEOUS
  Administered 2019-07-06 – 2019-07-07 (×3): 3 [IU] via SUBCUTANEOUS
  Administered 2019-07-07: 4 [IU] via SUBCUTANEOUS
  Administered 2019-07-07 – 2019-07-08 (×2): 7 [IU] via SUBCUTANEOUS
  Administered 2019-07-08: 3 [IU] via SUBCUTANEOUS
  Administered 2019-07-08 (×2): 7 [IU] via SUBCUTANEOUS
  Administered 2019-07-09 (×3): 4 [IU] via SUBCUTANEOUS
  Administered 2019-07-09: 3 [IU] via SUBCUTANEOUS
  Administered 2019-07-09: 4 [IU] via SUBCUTANEOUS
  Administered 2019-07-10: 3 [IU] via SUBCUTANEOUS
  Administered 2019-07-10: 4 [IU] via SUBCUTANEOUS
  Administered 2019-07-10: 11 [IU] via SUBCUTANEOUS
  Administered 2019-07-10: 3 [IU] via SUBCUTANEOUS
  Administered 2019-07-10: 7 [IU] via SUBCUTANEOUS
  Administered 2019-07-11: 3 [IU] via SUBCUTANEOUS
  Administered 2019-07-11: 4 [IU] via SUBCUTANEOUS
  Administered 2019-07-11: 3 [IU] via SUBCUTANEOUS
  Administered 2019-07-11: 4 [IU] via SUBCUTANEOUS
  Administered 2019-07-11: 7 [IU] via SUBCUTANEOUS

## 2019-07-03 MED ORDER — VANCOMYCIN HCL 2000 MG/400ML IV SOLN
2000.0000 mg | Freq: Once | INTRAVENOUS | Status: AC
  Administered 2019-07-03: 2000 mg via INTRAVENOUS
  Filled 2019-07-03: qty 400

## 2019-07-03 MED ORDER — HEPARIN SODIUM (PORCINE) 5000 UNIT/ML IJ SOLN
5000.0000 [IU] | Freq: Three times a day (TID) | INTRAMUSCULAR | Status: DC
  Administered 2019-07-03 – 2019-07-12 (×27): 5000 [IU] via SUBCUTANEOUS
  Filled 2019-07-03 (×26): qty 1

## 2019-07-03 MED ORDER — LEVETIRACETAM IN NACL 1000 MG/100ML IV SOLN
1000.0000 mg | Freq: Two times a day (BID) | INTRAVENOUS | Status: DC
  Administered 2019-07-03 – 2019-07-05 (×4): 1000 mg via INTRAVENOUS
  Filled 2019-07-03 (×5): qty 100

## 2019-07-03 MED ORDER — METHYLPREDNISOLONE SODIUM SUCC 125 MG IJ SOLR
60.0000 mg | Freq: Four times a day (QID) | INTRAMUSCULAR | Status: DC
  Administered 2019-07-04 – 2019-07-05 (×6): 60 mg via INTRAVENOUS
  Filled 2019-07-03 (×6): qty 2

## 2019-07-03 MED ORDER — SODIUM CHLORIDE 0.9 % IV SOLN
1.0000 g | Freq: Once | INTRAVENOUS | Status: AC
  Administered 2019-07-03: 1 g via INTRAVENOUS
  Filled 2019-07-03: qty 10

## 2019-07-03 MED ORDER — LACTATED RINGERS IV SOLN: INTRAVENOUS | Status: DC

## 2019-07-03 MED ORDER — METHYLPREDNISOLONE SODIUM SUCC 125 MG IJ SOLR
125.0000 mg | Freq: Once | INTRAMUSCULAR | Status: AC
  Administered 2019-07-03: 125 mg via INTRAVENOUS
  Filled 2019-07-03: qty 2

## 2019-07-03 MED ORDER — IPRATROPIUM-ALBUTEROL 0.5-2.5 (3) MG/3ML IN SOLN
RESPIRATORY_TRACT | Status: AC
  Administered 2019-07-03: 22:00:00 3 mL via RESPIRATORY_TRACT
  Filled 2019-07-03: qty 3

## 2019-07-03 MED ORDER — ONDANSETRON HCL 4 MG/2ML IJ SOLN
4.0000 mg | Freq: Once | INTRAMUSCULAR | Status: AC
  Administered 2019-07-03: 4 mg via INTRAVENOUS
  Filled 2019-07-03: qty 2

## 2019-07-03 MED ORDER — IPRATROPIUM-ALBUTEROL 0.5-2.5 (3) MG/3ML IN SOLN
3.0000 mL | Freq: Four times a day (QID) | RESPIRATORY_TRACT | Status: DC
  Administered 2019-07-03 – 2019-07-09 (×20): 3 mL via RESPIRATORY_TRACT
  Filled 2019-07-03 (×23): qty 3

## 2019-07-03 MED ORDER — SODIUM CHLORIDE 0.9 % IV SOLN
500.0000 mg | Freq: Once | INTRAVENOUS | Status: AC
  Administered 2019-07-03: 12:00:00 500 mg via INTRAVENOUS
  Filled 2019-07-03: qty 500

## 2019-07-03 NOTE — ED Notes (Signed)
Pt had large loose BM, cleaned pt's perineal area and applied clean sheets and pads under pt.

## 2019-07-03 NOTE — Progress Notes (Addendum)
RT called for evaluation of pt in acute respiratory distress. On 7L [place] pt's SATs were 83%. Pt appears diaphoretic, RR 26-30 bpm. 100% non-rebreather placed and SATs improved to 93-97%. Right lung is clear and diminished at bases, Left lung has crackles throughout and more diminished at the base. RT will leave pt on NRB and titrate down to HFNC when able. Pt wears 4L at baseline with a hx of COPD and CHF with a recent leg surgery performed. RN to swab for COVID.

## 2019-07-03 NOTE — Progress Notes (Signed)
Left lower extremity venous duplex completed. Refer to "CV Proc" under chart review to view preliminary results.  07/03/2019 3:24 PM Kelby Aline., MHA, RVT, RDCS, RDMS

## 2019-07-03 NOTE — Progress Notes (Signed)
Pharmacy Antibiotic Note  Diane Gilbert is a 54 y.o. female admitted on 07/02/2019 with pneumonia.  Pharmacy has been consulted for vancomycin and cefepime dosing.  Patient has history of swelling and rash with Unasyn. Patient has received cephalosporins prior to this admission with no concerns documented.   Plan: Cefepime 2 gm IV q8hr Vancomycin load 2 gm IV x 1 dose; then start Vancomycin 1750 mg IV q24hrs (Est AUC 487.3)   - Monitor for signs and symptoms of clinical improvement, obtain vancomycin levels as need   Height: _0  (162.6 cm) IBW/kg (Calculated) : 54.7  Temp (24hrs), Avg:97.4 F (36.3 C), Min:93 F (33.9 C), Max:99.2 F (37.3 C)  Recent Labs  Lab 07/02/19 1736  WBC 8.6  CREATININE 0.70    CrCl cannot be calculated (Unknown ideal weight.).    Allergies  Allergen Reactions  . Unasyn [Ampicillin-Sulbactam Sodium] Swelling and Rash    angioedema  . Contrast Media [Iodinated Diagnostic Agents] Nausea And Vomiting  . Hydralazine Swelling    Antimicrobials this admission: 1/4 Vancomycin >>  1/4 Cefepime >>  Dose adjustments this admission: none  Microbiology results: 1/4 BCx:  1/4 MRSA PCR:   Diane Gilbert, PharmD  PGY1 Acute Care Pharmacy Resident [phone] 07/03/2019 3:30 PM

## 2019-07-03 NOTE — ED Notes (Signed)
Pt is NSR on monitor

## 2019-07-03 NOTE — H&P (Signed)
NAME:  Diane Gilbert, MRN:  [id number], DOB:  Oct 30, 1965, LOS: 0 ADMISSION DATE:  07/02/2019, CONSULTATION DATE:  07/03/2019 REFERRING MD:  Dr. Sherry Ruffing, ER, CHIEF COMPLAINT:  Short of breath, chest pain   Brief History   54 yo female smoker presented to ER with chest discomfort and dyspnea.  She has hx of respiratory bronchiolitis with interstitial lung disease (RB ILD) on prednisone 10 mg daily, emphysema and chronic hypoxic respiratory failure on 4 liters oxygen.  She was found to have Lt MCL avulsion, and superimposed comminuted spiral fx of proximal fibula on 06/19/19.  Found to have acute hypoxia and hypercapnia, and CT chest showed changes of pneumonia.  COVID 19 test negative.  Started on Bipap, ABx, steroids.  PCCM asked to admit to ICU.  Past Medical History  CVA. Seizure, Chronic diastolic CHF, Schizophrenia  Significant Hospital Events   1/04 Admit  Consults:    Procedures:    Significant Diagnostic Tests:  CT angio chest 1/04 >> patchy opacities RML and RLL, mild GGO b/l, air trapping, mild centrilobular emphysema  Micro Data:  SARS CoV2 PCR 1/04 >> negative Influenza PCR 1/04 >> A and B negative Pneumococcal Ag 1/04 >> Legionella Ag 1/04 >> Blood 1/04 >>   Antimicrobials:  Rocephin 1/04 Zithromax 1/04 Vancomycin 1/04 >> Cefepime 1/04 >>     Interim history/subjective:    Objective   Blood pressure 115/61, pulse 63, temperature 98.3 F (36.8 C), temperature source Oral, resp. rate (!) 27, height _0  (1.626 m), last menstrual period 04/06/2017, SpO2 93 %.    Vent Mode: PCV;BIPAP Set Rate:  [34 bmp] 34 bmp PEEP:  [8 cmH20] 8 cmH20   Intake/Output Summary (Last 24 hours) at 07/03/2019 1459 Last data filed at 07/03/2019 1259 Gross per 24 hour  Intake 350 ml  Output --  Net 350 ml   There were no vitals filed for this visit.  Examination:  General - somnolent Eyes - pupils reactive ENT - Bipap mask on Cardiac - regular rate/rhythm, no  murmur Chest - decreased BS, no wheeze, basilar rhonchi Abdomen - soft, non tender, + bowel sounds Extremities - 1+ edema Skin - no rashes Neuro - wakes up easily, follows commands appropriately, moves all extremities, able to state her name and location clearly   Resolved Hospital Problem list     Assessment & Plan:   Acute on chronic hypoxic/hypercapnic respiratory failure in setting CAP with hx of RB ILD on chronic prednisone therapy, emphysema with continue cigarette smoking, and obesity hypoventilation syndrome. - continue Bipap - f/u ABG, CXR - monitor in ICU to determine if she needs ETT - change ABx to vancomycin and cefepime given that she is on chronic prednisone and at risk for Staph and Pseudomonas - scheduled BDs - solumedrol 60 q6h - f/u cultures, urine pneumococcal/legionella Ag - had negative sleep study as outpt for OSA >> likely will need NIPPV set up as outpt for chronic respiratory failure  Chronic diastolic CHF. - continue lasix IV - hold outpt cozaar, toprol, crestor for now  Hx of CVA with seizure disorder. - continue keppra IV  Hx of schizophrenia. - hold outpt wellbutrin, prozac, neurontin, seroquel, zanaflex, trazodone, abilify for now  Steroid induced hyperglycemia. - SSI  Lt MCL avulsion, and superimposed comminuted spiral fx of proximal fibula on 06/19/19 - will need outpt f/u with orthopedics - f/u doppler legs   Best practice:  Diet: NPO DVT prophylaxis: SQ heparin GI prophylaxis: not indicated Mobility: bed rest  Code Status: full code Disposition: ICU  Labs   CBC: Recent Labs  Lab 07/02/19 1736  WBC 8.6  HGB 13.8  HCT 50.6*  MCV 102.2*  PLT 884    Basic Metabolic Panel: Recent Labs  Lab 07/02/19 1736  NA 138  K 4.1  CL 93*  CO2 39*  GLUCOSE 149*  BUN 7  CREATININE 0.70  CALCIUM 8.8*   GFR: CrCl cannot be calculated (Unknown ideal weight.). Recent Labs  Lab 07/02/19 1736  WBC 8.6    Liver Function  Tests: No results for input(s): AST, ALT, ALKPHOS, BILITOT, PROT, ALBUMIN in the last 168 hours. No results for input(s): LIPASE, AMYLASE in the last 168 hours. No results for input(s): AMMONIA in the last 168 hours.  ABG    Component Value Date/Time   PHART 7.117 (LL) 07/03/2019 1302   PCO2ART >120 (HH) 07/03/2019 1302   PO2ART 101 07/03/2019 1302   HCO3 44.6 (H) 07/03/2019 1302   TCO2 39 (H) 03/30/2019 0407   O2SAT 95.8 07/03/2019 1302     Coagulation Profile: No results for input(s): INR, PROTIME in the last 168 hours.  Cardiac Enzymes: No results for input(s): CKTOTAL, CKMB, CKMBINDEX, TROPONINI in the last 168 hours.  HbA1C: Hgb A1c MFr Bld  Date/Time Value Ref Range Status  03/30/2019 02:42 AM 6.0 (H) 4.8 - 5.6 % Final    Comment:    (NOTE) Pre diabetes:          5.7%-6.4% Diabetes:              >6.4% Glycemic control for   <7.0% adults with diabetes   07/04/2016 05:58 PM 6.2 (H) 4.8 - 5.6 % Final    Comment:    (NOTE)         Pre-diabetes: 5.7 - 6.4         Diabetes: >6.4         Glycemic control for adults with diabetes: <7.0     CBG: No results for input(s): GLUCAP in the last 168 hours.  Review of Systems:   Unable to obtain due to altered mental status.  Past Medical History  She,  has a past medical history of Acute on chronic respiratory failure with hypoxia ([place]) (06/07/2014), Acute respiratory failure with hypoxia ([place]) (03/23/2017), Alcohol abuse, Anxiety, Arthritis, CHF (congestive heart failure) ([place]), Community acquired pneumonia, Depression, Headache(784.0), Hypertension, Insomnia, Interstitial lung disease ([place]), Oxygen dependent, Respiratory failure, acute and chronic ([place]) (05/09/2017), Schizophrenia ([place]), Shortness of breath dyspnea, Sleep apnea, and Sleep apnea.   Surgical History    Past Surgical History:  Procedure Laterality Date  . BACK SURGERY    . LUNG BIOPSY Left 07/16/2014   Procedure: LUNG BIOPSY;  Surgeon: Grace Isaac,  MD;  Location: [place];  Service: Thoracic;  Laterality: Left;  Marland Kitchen MULTIPLE TOOTH EXTRACTIONS    . RIGHT/LEFT HEART CATH AND CORONARY ANGIOGRAPHY N/A 01/04/2018   Procedure: RIGHT/LEFT HEART CATH AND CORONARY ANGIOGRAPHY;  Surgeon: Adrian Prows, MD;  Location: [facility];  Service: Cardiovascular;  Laterality: N/A;  . VIDEO ASSISTED THORACOSCOPY Left 07/16/2014   Procedure: VIDEO ASSISTED THORACOSCOPY;  Surgeon: Grace Isaac, MD;  Location: [facility];  Service: Thoracic;  Laterality: Left;  Marland Kitchen VIDEO BRONCHOSCOPY Bilateral 04/11/2014   Procedure: VIDEO BRONCHOSCOPY WITH FLUORO;  Surgeon: Kathee Delton, MD;  Location: [facility];  Service: Cardiopulmonary;  Laterality: Bilateral;  . VIDEO BRONCHOSCOPY N/A 07/16/2014   Procedure: VIDEO BRONCHOSCOPY;  Surgeon: Grace Isaac, MD;  Location: [facility];  Service: Thoracic;  Laterality: N/A;  . WEDGE RESECTION Left 07/16/2014   Procedure: WEDGE RESECTION;  Surgeon: Grace Isaac, MD;  Location: [facility];  Service: Thoracic;  Laterality: Left;  left upper lobe lung resection     Social History   reports that she has been smoking cigarettes. She has a 45.00 pack-year smoking history. She has never used smokeless tobacco. She reports current alcohol use. She reports current drug use. Drug: Cocaine.   Family History   Her family history includes Breast cancer in her mother; COPD in her maternal aunt; Obstructive Sleep Apnea in her mother.   Allergies Allergies  Allergen Reactions  . Unasyn [Ampicillin-Sulbactam Sodium] Swelling and Rash    angioedema  . Contrast Media [Iodinated Diagnostic Agents] Nausea And Vomiting  . Hydralazine Swelling     Home Medications  Prior to Admission medications   Medication Sig Start Date End Date Taking? Authorizing Provider  albuterol (PROVENTIL HFA;VENTOLIN HFA) 108 (90 BASE) MCG/ACT inhaler Inhale 2 puffs into the lungs every 6 (six) hours as needed for wheezing or shortness of breath. 05/09/15  Yes Juanito Doom, MD  albuterol (PROVENTIL) (2.5 MG/3ML) 0.083% nebulizer solution Take 3 mLs (2.5 mg total) by nebulization every 6 (six) hours as needed for wheezing or shortness of breath. 02/06/19  Yes Martyn Ehrich, NP  budesonide-formoterol (SYMBICORT) 160-4.5 MCG/ACT inhaler INHALE TWO puffs into THE lungs TWICE DAILY Patient taking differently: Inhale 2 puffs into the lungs 2 (two) times daily. INHALE TWO puffs into THE lungs TWICE DAILY 02/06/19  Yes Martyn Ehrich, NP  buPROPion [facility] SR) 150 MG 12 hr tablet Take 150 mg by mouth daily. 06/19/19  Yes [provider]  diclofenac sodium (VOLTAREN) 1 % GEL Apply 2 g topically 4 (four) times daily.   Yes [provider]  FLUoxetine (PROZAC) 20 MG capsule Take 20 mg by mouth daily. 06/06/19  Yes [provider]  furosemide (LASIX) 40 MG tablet Take 1 tablet (40 mg total) by mouth 2 (two) times daily. 03/12/19  Yes Lavina Hamman, MD  gabapentin (NEURONTIN) 300 MG capsule Take 300 mg by mouth 3 (three) times daily.   Yes [provider]  levETIRAcetam (KEPPRA) 1000 MG tablet Take 1 tablet (1,000 mg total) by mouth 2 (two) times daily. 04/03/19  Yes Harold Hedge, MD  losartan (COZAAR) 50 MG tablet Take 1 tablet (50 mg total) by mouth daily. 03/02/19  Yes Debbe Odea, MD  metoprolol succinate (TOPROL-XL) 25 MG 24 hr tablet Take 25 mg by mouth daily. 06/06/19  Yes [provider]  OXYGEN Inhale 4 L into the lungs continuous.    Yes [provider]  QUEtiapine (SEROQUEL) 100 MG tablet Take 1 tablet (100 mg total) by mouth 2 (two) times daily. 04/03/19  Yes Harold Hedge, MD  rosuvastatin (CRESTOR) 10 MG tablet Take 1 tablet (10 mg total) by mouth daily. 01/17/18  Yes Blanchie Dessert, MD  tiZANidine (ZANAFLEX) 4 MG capsule Take 4 mg by mouth 2 (two) times daily as needed for muscle spasms.    Yes [provider]  traZODone (DESYREL) 100 MG tablet Take 100 mg by mouth at bedtime.   Yes  [provider]  ARIPiprazole (ABILIFY) 5 MG tablet Take 5 mg by mouth daily. 02/23/19   [provider]     Critical care time: CC time 42 minutes     Chesley Mires, MD [place] 07/03/2019, 3:22 PM

## 2019-07-03 NOTE — ED Notes (Signed)
Pt is sinus tach on monitor

## 2019-07-03 NOTE — ED Notes (Signed)
Pt had loose BM, cleaned pt's perineal area and applied clean sheets and pad.

## 2019-07-03 NOTE — ED Provider Notes (Signed)
[facility] EMERGENCY DEPARTMENT Provider Note   CSN: [id number] Arrival date & time: 07/02/19  1706     History Chief Complaint  Patient presents with   Chest Pain    Diane Gilbert is a 54 y.o. female.  The history is provided by the patient and medical records. No language interpreter was used.  Chest Pain Pain location:  Substernal area and L chest Pain quality: pressure and tightness   Pain radiates to:  Does not radiate Pain severity:  Severe Onset quality:  Sudden Duration:  1 day Timing:  Constant Progression:  Unchanged Chronicity:  New Context: breathing   Relieved by:  Nothing Worsened by:  Deep breathing Ineffective treatments:  None tried Associated symptoms: cough and shortness of breath   Associated symptoms: no abdominal pain, no back pain, no diaphoresis, no fatigue, no fever, no headache, no lower extremity edema, no nausea, no palpitations and no vomiting        Past Medical History:  Diagnosis Date   Acute on chronic respiratory failure with hypoxia (HCC) 06/07/2014   Acute respiratory failure with hypoxia (HCC) 03/23/2017   Alcohol abuse    Anxiety    Arthritis    CHF (congestive heart failure) ([place])    Community acquired pneumonia    Depression    Headache(784.0)    Hypertension    Insomnia    Interstitial lung disease (HCC)    Oxygen dependent    Respiratory failure, acute and chronic ([place]) 05/09/2017   Schizophrenia (HCC)    Shortness of breath dyspnea    Sleep apnea    Sleep apnea     Patient Active Problem List   Diagnosis Date Noted   Hx of sleep apnea 03/29/2019   Status epilepticus ([place]) 17/00/1749   Acute diastolic CHF (congestive heart failure) ([place]) 03/08/2019   Acute encephalopathy 03/07/2019   Ischemic stroke ([place]) 03/07/2019   Acute respiratory failure with hypoxia ([place]) 02/26/2019   Falls 02/06/2019   Depression 02/06/2019   Acute on chronic diastolic CHF  (congestive heart failure) ([place]) 05/09/2017   Acute on chronic respiratory failure with hypoxia and hypercapnia (HCC) 05/09/2017   Facial swelling 03/23/2017   Chronic respiratory failure with hypoxia (HCC) 03/23/2017   OSA (obstructive sleep apnea) 44/96/7591   Acute diastolic congestive heart failure (HCC)    Schizophrenia ([place]) 03/24/2016   Leg swelling 01/16/2016   Interstitial lung disease ([place]) 07/16/2014   Respiratory bronchiolitis interstitial lung disease ([place]) 12/26/2013   Irregular menstrual cycle 01/23/2013   Smoking addiction 01/23/2013   Alcohol abuse 02/19/2012   Hyponatremia 02/19/2012   Abdominal pain 02/19/2012   Hypokalemia 02/19/2012    Past Surgical History:  Procedure Laterality Date   BACK SURGERY     LUNG BIOPSY Left 07/16/2014   Procedure: LUNG BIOPSY;  Surgeon: Grace Isaac, MD;  Location: [place];  Service: Thoracic;  Laterality: Left;   MULTIPLE TOOTH EXTRACTIONS     RIGHT/LEFT HEART CATH AND CORONARY ANGIOGRAPHY N/A 01/04/2018   Procedure: RIGHT/LEFT HEART CATH AND CORONARY ANGIOGRAPHY;  Surgeon: Adrian Prows, MD;  Location: [place] [facility];  Service: Cardiovascular;  Laterality: N/A;   VIDEO ASSISTED THORACOSCOPY Left 07/16/2014   Procedure: VIDEO ASSISTED THORACOSCOPY;  Surgeon: Grace Isaac, MD;  Location: [place];  Service: Thoracic;  Laterality: Left;   VIDEO BRONCHOSCOPY Bilateral 04/11/2014   Procedure: VIDEO BRONCHOSCOPY WITH FLUORO;  Surgeon: Kathee Delton, MD;  Location: [facility];  Service: Cardiopulmonary;  Laterality: Bilateral;  VIDEO BRONCHOSCOPY N/A 07/16/2014   Procedure: VIDEO BRONCHOSCOPY;  Surgeon: Grace Isaac, MD;  Location: [facility] OR;  Service: Thoracic;  Laterality: N/A;   WEDGE RESECTION Left 07/16/2014   Procedure: WEDGE RESECTION;  Surgeon: Grace Isaac, MD;  Location: [place];  Service: Thoracic;  Laterality: Left;  left upper lobe lung resection     OB History    Gravida  0   Para  0    Term  0   Preterm  0   AB  0   Living  0     SAB  0   TAB  0   Ectopic  0   Multiple  0   Live Births              Family History  Problem Relation Age of Onset   Breast cancer Mother    Obstructive Sleep Apnea Mother    COPD Maternal Aunt     Social History   Tobacco Use   Smoking status: Current Every Day Smoker    Packs/day: 1.50    Years: 30.00    Pack years: 45.00    Types: Cigarettes   Smokeless tobacco: Never Used   Tobacco comment: smoking 3 cigarettes daily "every now and then" 11/04/16  Substance Use Topics   Alcohol use: Yes    Alcohol/week: 0.0 standard drinks    Comment: occasional    Drug use: Yes    Types: Cocaine    Home Medications Prior to Admission medications   Medication Sig Start Date End Date Taking? Authorizing Provider  albuterol (PROVENTIL HFA;VENTOLIN HFA) 108 (90 BASE) MCG/ACT inhaler Inhale 2 puffs into the lungs every 6 (six) hours as needed for wheezing or shortness of breath. 05/09/15   Juanito Doom, MD  albuterol (PROVENTIL) (2.5 MG/3ML) 0.083% nebulizer solution Take 3 mLs (2.5 mg total) by nebulization every 6 (six) hours as needed for wheezing or shortness of breath. 02/06/19   Martyn Ehrich, NP  ARIPiprazole (ABILIFY) 5 MG tablet Take 5 mg by mouth daily. 02/23/19   [provider]  budesonide-formoterol (SYMBICORT) 160-4.5 MCG/ACT inhaler INHALE TWO puffs into THE lungs TWICE DAILY 02/06/19   Martyn Ehrich, NP  diclofenac sodium (VOLTAREN) 1 % GEL Apply 2 g topically 4 (four) times daily.    [provider]  furosemide (LASIX) 40 MG tablet Take 1 tablet (40 mg total) by mouth 2 (two) times daily. 03/12/19   Lavina Hamman, MD  gabapentin (NEURONTIN) 300 MG capsule Take 300 mg by mouth 3 (three) times daily.    [provider]  levETIRAcetam (KEPPRA) 1000 MG tablet Take 1 tablet (1,000 mg total) by mouth 2 (two) times daily. 04/03/19   Harold Hedge, MD  losartan (COZAAR) 50  MG tablet Take 1 tablet (50 mg total) by mouth daily. 03/02/19   Debbe Odea, MD  OXYGEN Inhale 4 L into the lungs continuous.     [provider]  polyethylene glycol (MIRALAX / GLYCOLAX) 17 g packet Take 17 g by mouth daily. 03/12/19   Lavina Hamman, MD  QUEtiapine (SEROQUEL) 100 MG tablet Take 1 tablet (100 mg total) by mouth 2 (two) times daily. 04/03/19   Harold Hedge, MD  rosuvastatin (CRESTOR) 10 MG tablet Take 1 tablet (10 mg total) by mouth daily. 01/17/18   Blanchie Dessert, MD  tiZANidine (ZANAFLEX) 4 MG capsule Take 4 mg by mouth 2 (two) times daily as needed for muscle spasms.  [provider]  traZODone (DESYREL) 100 MG tablet Take 100 mg by mouth at bedtime.    [provider]    Allergies    Unasyn [ampicillin-sulbactam sodium], Contrast media [iodinated diagnostic agents], and Hydralazine  Review of Systems   Review of Systems  Constitutional: Negative for chills, diaphoresis, fatigue and fever.  HENT: Negative for congestion.   Respiratory: Positive for cough, chest tightness and shortness of breath. Negative for wheezing and stridor.   Cardiovascular: Positive for chest pain and leg swelling. Negative for palpitations.  Gastrointestinal: Negative for abdominal pain, constipation, diarrhea, nausea and vomiting.  Genitourinary: Negative for flank pain.  Musculoskeletal: Negative for back pain and neck pain.  Neurological: Negative for headaches.  Psychiatric/Behavioral: Negative for agitation.  All other systems reviewed and are negative.   Physical Exam Updated Vital Signs BP (!) 153/81 (BP Location: Left Arm)    Pulse 83    Temp 98.3 F (36.8 C) (Oral)    Resp 18    LMP 04/06/2017    SpO2 94%   Physical Exam Vitals and nursing note reviewed.  Constitutional:      General: She is not in acute distress.    Appearance: She is well-developed. She is not ill-appearing, toxic-appearing or diaphoretic.  HENT:     Head: Normocephalic and  atraumatic.     Right Ear: External ear normal.     Left Ear: External ear normal.     Nose: Nose normal.     Mouth/Throat:     Pharynx: No oropharyngeal exudate.  Eyes:     Extraocular Movements: Extraocular movements intact.     Conjunctiva/sclera: Conjunctivae normal.     Pupils: Pupils are equal, round, and reactive to light.  Cardiovascular:     Rate and Rhythm: Normal rate and regular rhythm.     Heart sounds: Murmur (mild) present.  Pulmonary:     Effort: Tachypnea present. No respiratory distress.     Breath sounds: No stridor. Rhonchi and rales present. No decreased breath sounds or wheezing.  Abdominal:     General: There is no distension.     Tenderness: There is no abdominal tenderness. There is no rebound.  Musculoskeletal:     Cervical back: Normal range of motion and neck supple.     Right lower leg: No tenderness. Edema present.     Left lower leg: Tenderness present. Edema present.  Skin:    General: Skin is warm.     Capillary Refill: Capillary refill takes less than 2 seconds.     Findings: No erythema or rash.  Neurological:     Mental Status: She is alert and oriented to person, place, and time.     Motor: No abnormal muscle tone.     Coordination: Coordination normal.     Deep Tendon Reflexes: Reflexes are normal and symmetric.  Psychiatric:        Mood and Affect: Mood normal.     ED Results / Procedures / Treatments   Labs (all labs ordered are listed, but only abnormal results are displayed) Labs Reviewed  BASIC METABOLIC PANEL - Abnormal; Notable for the following components:      Result Value   Chloride 93 (*)    CO2 39 (*)    Glucose, Bld 149 (*)    Calcium 8.8 (*)    All other components within normal limits  CBC - Abnormal; Notable for the following components:   HCT 50.6 (*)    MCV 102.2 (*)  MCHC 27.3 (*)    nRBC 1.2 (*)    All other components within normal limits  BRAIN NATRIURETIC PEPTIDE - Abnormal; Notable for the following  components:   B Natriuretic Peptide 232.1 (*)    All other components within normal limits  BLOOD GAS, ARTERIAL - Abnormal; Notable for the following components:   pH, Arterial 7.117 (*)    pCO2 arterial >120 (*)    Bicarbonate 44.6 (*)    Acid-Base Excess 15.0 (*)    All other components within normal limits  BLOOD GAS, ARTERIAL - Abnormal; Notable for the following components:   pH, Arterial 7.216 (*)    pCO2 arterial 115 (*)    pO2, Arterial 70.8 (*)    Bicarbonate 45.3 (*)    Acid-Base Excess 17.1 (*)    All other components within normal limits  COMPREHENSIVE METABOLIC PANEL - Abnormal; Notable for the following components:   Chloride 92 (*)    CO2 42 (*)    Glucose, Bld 136 (*)    Calcium 8.8 (*)    All other components within normal limits  CBC WITH DIFFERENTIAL/PLATELET - Abnormal; Notable for the following components:   WBC 10.7 (*)    HCT 51.7 (*)    MCV 104.2 (*)    MCHC 26.7 (*)    nRBC 0.6 (*)    Abs Immature Granulocytes 0.10 (*)    All other components within normal limits  STREP PNEUMONIAE URINARY ANTIGEN - Abnormal; Notable for the following components:   Strep Pneumo Urinary Antigen POSITIVE (*)    All other components within normal limits  RESPIRATORY PANEL BY RT PCR (FLU A&B, COVID)  CULTURE, BLOOD (ROUTINE X 2)  CULTURE, BLOOD (ROUTINE X 2)  MRSA PCR SCREENING  LACTIC ACID, PLASMA  PHOSPHORUS  MAGNESIUM  CBC  LACTIC ACID, PLASMA  LACTIC ACID, PLASMA  BRAIN NATRIURETIC PEPTIDE  LEGIONELLA PNEUMOPHILA SEROGP 1 UR AG  BLOOD GAS, ARTERIAL  HEMOGLOBIN A1C  I-STAT BETA HCG BLOOD, ED ([facility], [facility], [facility] ONLY)  TROPONIN I (HIGH SENSITIVITY)  TROPONIN I (HIGH SENSITIVITY)  TROPONIN I (HIGH SENSITIVITY)  TROPONIN I (HIGH SENSITIVITY)    EKG EKG Interpretation  Date/Time:  Sunday July 02 2019 17:28:56 EST Ventricular Rate:  100 PR Interval:  168 QRS Duration: 72 QT Interval:  322 QTC Calculation: 415 R Axis:   87 Text Interpretation: Normal sinus  rhythm Right atrial enlargement Anterior infarct , age undetermined Abnormal ECG When compared with ECG of 03/30/2019, No significant change was found Confirmed by Delora Fuel ([id number]) on 07/03/2019 12:00:11 AM   Radiology DG Chest 2 View  Result Date: 07/02/2019 CLINICAL DATA:  Chest pain EXAM: CHEST - 2 VIEW COMPARISON:  03/31/2019 FINDINGS: Moderate cardiomegaly, stable. There is pulmonary vascular congestion with diffuse bilateral interstitial opacities. No pleural effusion or pneumothorax. IMPRESSION: Findings suggestive of CHF with interstitial edema. Electronically Signed   By: Davina Poke D.O.   On: 07/02/2019 17:55   CT Angio Chest PE W and/or Wo Contrast  Result Date: 07/03/2019 CLINICAL DATA:  Shortness of breath, severe hypoxia EXAM: CT ANGIOGRAPHY CHEST WITH CONTRAST TECHNIQUE: Multidetector CT imaging of the chest was performed using the standard protocol during bolus administration of intravenous contrast. Multiplanar CT image reconstructions and MIPs were obtained to evaluate the vascular anatomy. CONTRAST:  44m OMNIPAQUE IOHEXOL 350 MG/ML SOLN COMPARISON:  Chest radiographs dated 07/02/2019. High-resolution CT chest dated 02/22/2019. FINDINGS: Cardiovascular: Evaluation of the pulmonary arteries is constrained by respiratory motion. Within that constraint, there  is no evidence of pulmonary embolism. Heart is top-normal in size.  No pericardial effusion. No evidence of thoracic aortic aneurysm. Very mild atherosclerotic calcifications of the aortic arch. Mediastinum/Nodes: 17 mm short axis subcarinal node. 18 mm short axis right hilar node. 8 mm short axis left hilar node, technically within normal limits. These findings are favored to be new from the prior. Visualized thyroid is unremarkable. Lungs/Pleura: Patchy opacities in the medial right middle lobe and right lower lobe, suspicious for pneumonia. Mild patchy opacity anteriorly in the left lower lobe, atelectasis versus pneumonia.  Evaluation of the lung parenchyma is constrained by respiratory motion. Within that constraint, there are no suspicious pulmonary nodules. Mild ground-glass opacity/mosaic attenuation in the lungs bilaterally, chronic/unchanged from the prior, favoring air trapping. Underlying mild centrilobular emphysematous changes in the upper lobes. No pleural effusion or pneumothorax. Upper Abdomen: Visualized upper abdomen is grossly unremarkable. Musculoskeletal: Visualized osseous structures are within normal limits. Review of the MIP images confirms the above findings. IMPRESSION: No evidence of pulmonary embolism. Right middle and lower lobe pneumonia. Atelectasis versus pneumonia in the left lower lobe. Mediastinal/hilar lymphadenopathy, new from the prior. Although possibly reactive, follow-up CT chest (with contrast) is suggested in 3 months to assess for resolution. Aortic Atherosclerosis (ICD10-I70.0) and Emphysema (ICD10-J43.9). Electronically Signed   By: Julian Hy M.D.   On: 07/03/2019 10:21   VAS [place] LOWER EXTREMITY VENOUS (DVT) (ONLY [facility] & [facility])  Result Date: 07/03/2019  Lower Venous Study Indications: Swelling.  Limitations: Body habitus, poor ultrasound/tissue interface and patient sitting in Fowler's position. Comparison Study: No prior study. Performing Technologist: Maudry Mayhew MHA, RDMS, RVT, RDCS  Examination Guidelines: A complete evaluation includes B-mode imaging, spectral Doppler, color Doppler, and power Doppler as needed of all accessible portions of each vessel. Bilateral testing is considered an integral part of a complete examination. Limited examinations for reoccurring indications may be performed as noted.  +---------+---------------+---------+-----------+----------+--------------+  LEFT      Compressibility Phasicity Spontaneity Properties Thrombus Aging  +---------+---------------+---------+-----------+----------+--------------+  CFV                                                         Not visualized  +---------+---------------+---------+-----------+----------+--------------+  SFJ                                                        Not visualized  +---------+---------------+---------+-----------+----------+--------------+  FV Prox   Full                                                             +---------+---------------+---------+-----------+----------+--------------+  FV Mid    Full                                                             +---------+---------------+---------+-----------+----------+--------------+  FV Distal                                                  Not visualized  +---------+---------------+---------+-----------+----------+--------------+  PFV                                                        Not visualized  +---------+---------------+---------+-----------+----------+--------------+  POP       Full                      Yes                                    +---------+---------------+---------+-----------+----------+--------------+  PTV       Full                                                             +---------+---------------+---------+-----------+----------+--------------+  PERO                                                       Not visualized  +---------+---------------+---------+-----------+----------+--------------+  Summary: Left: There is no evidence of deep vein thrombosis in the lower extremity. However, portions of this examination were limited- see technologist comments above. No cystic structure found in the popliteal fossa. Study was very technically limited and difficult due to patient sitting completely upright and on bipap.  *See table(s) above for measurements and observations.    Preliminary     Procedures Procedures (including critical care time)  CRITICAL CARE Performed by: Gwenyth Allegra Nayvie Lips Total critical care time: 50 minutes Critical care time was exclusive of separately billable procedures and treating  other patients. Oxygen saturations in the 20s requiring nonrebreather. Critical care was necessary to treat or prevent imminent or life-threatening deterioration. Critical care was time spent personally by me on the following activities: development of treatment plan with patient and/or surrogate as well as nursing, discussions with consultants, evaluation of patient's response to treatment, examination of patient, obtaining history from patient or surrogate, ordering and performing treatments and interventions, ordering and review of laboratory studies, ordering and review of radiographic studies, pulse oximetry and re-evaluation of patient's condition.   Medications Ordered in ED Medications  sodium chloride flush (NS) 0.9 % injection 3 mL (has no administration in time range)  heparin injection 5,000 Units (5,000 Units Subcutaneous Given 07/03/19 1518)  levETIRAcetam (KEPPRA) IVPB 1000 mg/100 mL premix (0 mg Intravenous Stopped 07/03/19 1636)  ipratropium-albuterol (DUONEB) 0.5-2.5 (3) MG/3ML nebulizer solution 3 mL (3 mLs Nebulization Given 07/03/19 1615)  methylPREDNISolone sodium succinate (SOLU-MEDROL) 125 mg/2 mL injection 60 mg (has no administration in time range)  methylPREDNISolone sodium succinate (SOLU-MEDROL) 125 mg/2 mL injection 125 mg (has no administration in time range)  furosemide (LASIX)  injection 40 mg (has no administration in time range)  budesonide (PULMICORT) nebulizer solution 0.25 mg (has no administration in time range)  lactated ringers infusion (has no administration in time range)  vancomycin (VANCOREADY) IVPB 2000 mg/400 mL (has no administration in time range)  ceFEPIme (MAXIPIME) 2 g in sodium chloride 0.9 % 100 mL IVPB (has no administration in time range)  insulin aspart (novoLOG) injection 0-20 Units (has no administration in time range)  vancomycin (VANCOREADY) IVPB 1750 mg/350 mL (has no administration in time range)  ondansetron (ZOFRAN) injection 4 mg (4 mg  Intravenous Given 07/03/19 1004)  iohexol (OMNIPAQUE) 350 MG/ML injection 100 mL (80 mLs Intravenous Contrast Given 07/03/19 0955)  cefTRIAXone (ROCEPHIN) 1 g in sodium chloride 0.9 % 100 mL IVPB (0 g Intravenous Stopped 07/03/19 1251)  azithromycin (ZITHROMAX) 500 mg in sodium chloride 0.9 % 250 mL IVPB (0 mg Intravenous Stopped 07/03/19 1259)  furosemide (LASIX) injection 40 mg (40 mg Intravenous Given 07/03/19 1300)    ED Course  I have reviewed the triage vital signs and the nursing notes.  Pertinent labs & imaging results that were available during my care of the patient were reviewed by me and considered in my medical decision making (see chart for details).    MDM Rules/Calculators/A&P                      Diane Gilbert is a 54 y.o. female with a past medical history significant for interstitial lung disease on home oxygen, CHF, chronic peripheral edema, seizures, prior stroke, schizophrenia, hypertension, anxiety, and recent fibular fracture 2 weeks ago who presents with chest pain, shortness of breath, and hypoxia.  Patient reports that she was sent from urgent care for worsening shortness of breath and chest discomfort.  She says that she has had worsening leg pain and swelling in her left leg since her injury 2 weeks ago.  She reports that she takes oxygen at home normally but since yesterday has had worsening shortness breath and chest pain despite home oxygen.  She was found to have oxygen saturations between 19 and 25% with good waveform in the waiting room.  Patient has been waiting for 15-1/2 hours in the waiting room before I evaluated patient.  On my evaluation, on 6 L, her oxygen saturation was 82%.  I escalated her and she needed a nonrebreather to maintain oxygen saturations in the 90s.  She denies any fevers, chills congestion, or cough.  She reports that the chest pain and shortness of breath started since last night.  She denies any other trauma.  She reports she has a chronic  cough that is no different than baseline.  On exam, breath sounds are slightly coarse and with some rales.  Chest was tender to palpation.  Abdomen was nontender.  Slight murmur appreciated.  Patient has tenderness and edema to the left leg as well as some mild edema on the right leg.  Left calf was tender.  She did have palpable pulses in both legs.  Good pulses in upper extremities.  Patient is on nonrebreather now.  Given the patient's recent leg fracture I am clinically very concerned about pulmonary embolism as the cause of her symptoms.  Given the ongoing pandemic, will check for Covid.  Her her work-up overnight showed negative troponin, normal kidney function, and no leukocytosis or anemia.  Chest x-ray showed likely CHF.  We will add on a BNP.  We will get a Covid test.  We will continue to trend troponin.  She will need a PE study  Patient reports that she does have a nausea reaction with intolerance to IV contrast dye however after I spoke with patient and with the radiology tech, we are agreeable to give the patient Zofran 30 minutes prior to CT PE study.  Due to her needing a nonrebreather, she will need admission.  Anticipate admission after work-up.  11:41 AM Coronavirus test returned negative.  BNP slightly elevated.  CT scan does not show evidence of pulmonary ballismus does show pneumonia.  She will be given antibiotics and admitted for hypoxia with pneumonia.  1:12 PM Still had not yet talked to admitting team but patient's mental status is somewhat declined.  Delta troponin is negative.  She is still following some commands and opening eyes but she is more somnolent.  We will try BiPAP now that she has a negative Covid PCR test.  Will give Lasix for fluid overload as well.  After BiPAP, patient's mental status somewhat improved.  She is still somnolent and blood gas shows CO2 of greater than 120.  Due to this, critical care was called and she will be admitted to the ICU.  She  remained on BiPAP and not be intubated at this time.  Patient needs ICU for further management.   Final Clinical Impression(s) / ED Diagnoses Final diagnoses:  Chest pain, unspecified type  Shortness of breath  Hypoxia  Community acquired pneumonia, unspecified laterality    Rx / DC Orders ED Discharge Orders    None      Clinical Impression: 1. Chest pain, unspecified type   2. Shortness of breath   3. Hypoxia   4. Community acquired pneumonia, unspecified laterality     Disposition: Admit  This note was prepared with assistance of Systems analyst. Occasional wrong-word or sound-a-like substitutions may have occurred due to the inherent limitations of voice recognition software.     Mamie Diiorio, Gwenyth Allegra, MD 07/03/19 [phone]

## 2019-07-03 NOTE — ED Notes (Signed)
Pt placed on bed alarm, because pt was found sitting at the end of the bed.

## 2019-07-03 NOTE — Progress Notes (Addendum)
Pt is increasingly lethargic and requiring 15L NRB and 15 HFNC to keep SATs >88%. Pt is unable to follow commands at this time which is a change in mental status compared to 4 hours ago. MD made aware of RT concerns. ABG drawn and sent to main lab. Lab notified of ABG sample sent down. MD requested RT place pt on bi-pap at this time. Pt is now on BI-PAP through the vent with a PC 18, PEEP 8, RR 34 and FIO2 70%. RT will continue to monitor pt and RN made aware of pt's worsening mental status.

## 2019-07-04 ENCOUNTER — Inpatient Hospital Stay (HOSPITAL_COMMUNITY): Payer: Medicaid Other

## 2019-07-04 DIAGNOSIS — I2722 Pulmonary hypertension due to left heart disease: Secondary | ICD-10-CM

## 2019-07-04 DIAGNOSIS — G9340 Encephalopathy, unspecified: Secondary | ICD-10-CM

## 2019-07-04 DIAGNOSIS — J811 Chronic pulmonary edema: Secondary | ICD-10-CM

## 2019-07-04 LAB — BASIC METABOLIC PANEL
Anion gap: 13 (ref 5–15)
BUN: 7 mg/dL (ref 6–20)
CO2: 40 mmol/L — ABNORMAL HIGH (ref 22–32)
Calcium: 8.8 mg/dL — ABNORMAL LOW (ref 8.9–10.3)
Chloride: 92 mmol/L — ABNORMAL LOW (ref 98–111)
Creatinine, Ser: 0.63 mg/dL (ref 0.44–1.00)
GFR calc Af Amer: >60 mL/min (ref >60)
GFR calc non Af Amer: >60 mL/min (ref >60)
Glucose, Bld: 108 mg/dL — ABNORMAL HIGH (ref 70–99)
Potassium: 4.5 mmol/L (ref 3.5–5.1)
Sodium: 145 mmol/L (ref 135–145)

## 2019-07-04 LAB — BLOOD GAS, ARTERIAL
Acid-Base Excess: 18.3 mmol/L — ABNORMAL HIGH (ref 0.0–2.0)
Bicarbonate: 46 mmol/L — ABNORMAL HIGH (ref 20.0–28.0)
Drawn by: 560371
FIO2: 50
O2 Saturation: 96 %
Patient temperature: 36.3
pCO2 arterial: 104 mmHg (ref 32.0–48.0)
pH, Arterial: 7.264 — ABNORMAL LOW (ref 7.350–7.450)
pO2, Arterial: 81.8 mmHg — ABNORMAL LOW (ref 83.0–108.0)

## 2019-07-04 LAB — ECHOCARDIOGRAM COMPLETE
Height: 64 in
Weight: 3816.6 [oz_av]

## 2019-07-04 LAB — GLUCOSE, CAPILLARY
Glucose-Capillary: 109 mg/dL — ABNORMAL HIGH (ref 70–99)
Glucose-Capillary: 112 mg/dL — ABNORMAL HIGH (ref 70–99)
Glucose-Capillary: 131 mg/dL — ABNORMAL HIGH (ref 70–99)
Glucose-Capillary: 124 mg/dL — ABNORMAL HIGH (ref 70–99)
Glucose-Capillary: 125 mg/dL — ABNORMAL HIGH (ref 70–99)
Glucose-Capillary: 217 mg/dL — ABNORMAL HIGH (ref 70–99)

## 2019-07-04 LAB — CBC
HCT: 51.1 % — ABNORMAL HIGH (ref 36.0–46.0)
Hemoglobin: 14.1 g/dL (ref 12.0–15.0)
MCH: 27.6 pg (ref 26.0–34.0)
MCHC: 27.6 g/dL — ABNORMAL LOW (ref 30.0–36.0)
MCV: 100 fL (ref 80.0–100.0)
Platelets: 216 10*3/uL (ref 150–400)
RBC: 5.11 MIL/uL (ref 3.87–5.11)
RDW: 15.2 % (ref 11.5–15.5)
WBC: 8.1 10*3/uL (ref 4.0–10.5)
nRBC: 0.2 % (ref 0.0–0.2)

## 2019-07-04 LAB — LEGIONELLA PNEUMOPHILA SEROGP 1 UR AG: L. pneumophila Serogp 1 Ur Ag: NEGATIVE

## 2019-07-04 LAB — MRSA PCR SCREENING: MRSA by PCR: NEGATIVE

## 2019-07-04 MED ORDER — FUROSEMIDE 10 MG/ML IJ SOLN
40.0000 mg | Freq: Four times a day (QID) | INTRAMUSCULAR | Status: AC
  Administered 2019-07-04 (×3): 40 mg via INTRAVENOUS
  Filled 2019-07-04 (×3): qty 4

## 2019-07-04 MED ORDER — SODIUM CHLORIDE 0.9 % IV SOLN
1.0000 g | INTRAVENOUS | Status: AC
  Administered 2019-07-04 – 2019-07-05 (×2): 1 g via INTRAVENOUS
  Filled 2019-07-04 (×2): qty 1

## 2019-07-04 MED ORDER — LORAZEPAM 2 MG/ML IJ SOLN
0.5000 mg | Freq: Once | INTRAMUSCULAR | Status: AC
  Administered 2019-07-04: 0.5 mg via INTRAVENOUS
  Filled 2019-07-04: qty 1

## 2019-07-04 MED ORDER — SODIUM CHLORIDE 0.9 % IV SOLN
INTRAVENOUS | Status: DC | PRN
  Administered 2019-07-04: 02:00:00 250 mL via INTRAVENOUS

## 2019-07-04 MED ORDER — CHLORHEXIDINE GLUCONATE CLOTH 2 % EX PADS
6.0000 | MEDICATED_PAD | Freq: Every day | CUTANEOUS | Status: DC
  Administered 2019-07-04 – 2019-07-11 (×5): 6 via TOPICAL

## 2019-07-04 MED ORDER — MAGNESIUM SULFATE 2 GM/50ML IV SOLN
2.0000 g | Freq: Once | INTRAVENOUS | Status: AC
  Administered 2019-07-04: 12:00:00 2 g via INTRAVENOUS
  Filled 2019-07-04: qty 50

## 2019-07-04 MED ORDER — MORPHINE SULFATE (PF) 2 MG/ML IV SOLN
2.0000 mg | INTRAVENOUS | Status: DC | PRN
  Administered 2019-07-05: 2 mg via INTRAVENOUS
  Filled 2019-07-04: qty 1

## 2019-07-04 MED ORDER — MORPHINE SULFATE (PF) 2 MG/ML IV SOLN
2.0000 mg | INTRAVENOUS | Status: DC | PRN
  Administered 2019-07-04: 2 mg via INTRAVENOUS
  Filled 2019-07-04: qty 1

## 2019-07-04 NOTE — ED Notes (Signed)
Report given to 24M RN. All questions answered

## 2019-07-04 NOTE — Progress Notes (Addendum)
NAME:  Diane Gilbert, MRN:  [id number], DOB:  11/27/1965, LOS: 1 ADMISSION DATE:  07/02/2019, CONSULTATION DATE:  07/03/2019 REFERRING MD:  Dr. Sherry Ruffing, ER, CHIEF COMPLAINT:  Short of breath, chest pain   Brief History   54 yo female smoker presented to ER with chest discomfort and dyspnea.  She has hx of respiratory bronchiolitis with interstitial lung disease (RB ILD) on prednisone 10 mg daily, emphysema and chronic hypoxic respiratory failure on 4 liters oxygen.  She was found to have Lt MCL avulsion, and superimposed comminuted spiral fx of proximal fibula on 06/19/19.  Found to have acute hypoxia and hypercapnia, and CT chest showed changes of pneumonia.  COVID 19 test negative.  Started on Bipap, ABx, steroids.  PCCM asked to admit to ICU.  Past Medical History  CVA. Seizure, Chronic diastolic CHF, Schizophrenia  Significant Hospital Events   1/04 Admit  Consults:    Procedures:    Significant Diagnostic Tests:  CT angio chest 1/04 >> patchy opacities RML and RLL, mild GGO b/l, air trapping, mild centrilobular emphysema  Micro Data:  SARS CoV2 PCR 1/04 >> negative Influenza PCR 1/04 >> A and B negative Pneumococcal Ag 1/04 >> Legionella Ag 1/04 >> Blood 1/04 >>   Antimicrobials:  Rocephin 1/04 Zithromax 1/04 Vancomycin 1/04 >> Cefepime 1/04 >>     Interim history/subjective:  Patient on Bipap,left leg pain and chest pain this morning .   Objective   Blood pressure (!) 153/94, pulse 66, temperature 97.9 F (36.6 C), temperature source Axillary, resp. rate (!) 22, height _0  (1.626 m), weight 108.2 kg, last menstrual period 04/06/2017, SpO2 94 %.    Vent Mode: BIPAP;PCV FiO2 (%):  [50 %-60 %] 50 % Set Rate:  [24 bmp-34 bmp] 24 bmp PEEP:  [8 cmH20] 8 cmH20   Intake/Output Summary (Last 24 hours) at 07/04/2019 0839 Last data filed at 07/04/2019 0600 Gross per 24 hour  Intake 1155.04 ml  Output 1050 ml  Net 105.04 ml   Filed Weights   07/04/19 0049  07/04/19 0445  Weight: 108.2 kg 108.2 kg    Examination:  General - somnolent Eyes - pupils reactive ENT - Bipap mask on Cardiac - regular rate/rhythm, no murmur Chest - decreased BS, no wheeze, basilar rhonchi Abdomen - soft, non tender, + bowel sounds Extremities - 1+ edema Skin - no rashes Neuro - wakes up easily, follows commands appropriately, moves all extremities, able to state her name and location clearly   Resolved Hospital Problem list     Assessment & Plan:   Acute on chronic hypoxic/hypercapnic respiratory failure in setting CAP with hx of RB ILD on chronic prednisone therapy, emphysema with continue cigarette smoking, and obesity hypoventilation syndrome. - Continue Bipap - F/u ABG, CXR - Monitor in ICU to determine if she needs ETT - Continue  vancomycin and cefepime given that she is on chronic prednisone and at risk for Staph and Pseudomonas - Scheduled BDs - Solumedrol 60 q6h - F/u cultures, urine pneumococcal/legionella Ag - Had negative sleep study as outpt for OSA >> likely will need NIPPV set up as outpt for chronic respiratory failure  Chronic diastolic CHF. - Continue lasix IV 40 mg  - Hold outpt cozaar, toprol, crestor for now  Hx of CVA with seizure disorder. - Continue keppra IV  Hx of schizophrenia. - Hold outpt wellbutrin, prozac, neurontin, seroquel, zanaflex, trazodone, abilify for now  Steroid induced hyperglycemia. - SSI  Lt MCL avulsion, and superimposed comminuted spiral  fx of proximal fibula on 06/19/19 - Will need outpt f/u with orthopedics - F/u doppler legs  Best practice:  Diet: NPO DVT prophylaxis: SQ heparin GI prophylaxis: not indicated Mobility: bed rest Code Status: full code Disposition: ICU  Labs   CBC: Recent Labs  Lab 07/02/19 1736 07/03/19 1506 07/04/19 0540  WBC 8.6 10.7* 8.1  NEUTROABS  --  7.6  --   HGB 13.8 13.8 14.1  HCT 50.6* 51.7* 51.1*  MCV 102.2* 104.2* 100.0  PLT 210 216 216    Basic  Metabolic Panel: Recent Labs  Lab 07/02/19 1736 07/03/19 1506 07/04/19 0540  NA 138 143 145  K 4.1 4.4 4.5  CL 93* 92* 92*  CO2 39* 42* 40*  GLUCOSE 149* 136* 108*  BUN _0 CREATININE 0.70 0.87 0.63  CALCIUM 8.8* 8.8* 8.8*  MG  --  1.8  --   PHOS  --  4.6  --    GFR: Estimated Creatinine Clearance: 99.9 mL/min (by C-G formula based on SCr of 0.63 mg/dL). Recent Labs  Lab 07/02/19 1736 07/03/19 1506 07/04/19 0540  WBC 8.6 10.7* 8.1  LATICACIDVEN  --  1.0  --     Liver Function Tests: Recent Labs  Lab 07/03/19 1506  AST 31  ALT 16  ALKPHOS 94  BILITOT 1.1  PROT 7.9  ALBUMIN 3.9   No results for input(s): LIPASE, AMYLASE in the last 168 hours. No results for input(s): AMMONIA in the last 168 hours.  ABG    Component Value Date/Time   PHART 7.264 (L) 07/04/2019 0555   PCO2ART 104 (HH) 07/04/2019 0555   PO2ART 81.8 (L) 07/04/2019 0555   HCO3 46.0 (H) 07/04/2019 0555   TCO2 39 (H) 03/30/2019 0407   O2SAT 96.0 07/04/2019 0555     Coagulation Profile: No results for input(s): INR, PROTIME in the last 168 hours.  Cardiac Enzymes: No results for input(s): CKTOTAL, CKMB, CKMBINDEX, TROPONINI in the last 168 hours.  HbA1C: Hgb A1c MFr Bld  Date/Time Value Ref Range Status  03/30/2019 02:42 AM 6.0 (H) 4.8 - 5.6 % Final    Comment:    (NOTE) Pre diabetes:          5.7%-6.4% Diabetes:              >6.4% Glycemic control for   <7.0% adults with diabetes   07/04/2016 05:58 PM 6.2 (H) 4.8 - 5.6 % Final    Comment:    (NOTE)         Pre-diabetes: 5.7 - 6.4         Diabetes: >6.4         Glycemic control for adults with diabetes: <7.0     CBG: Recent Labs  Lab 07/03/19 1659 07/03/19 2009 07/04/19 0319 07/04/19 0741  GLUCAP 98 110* 109* 112*    Review of Systems:   Unable to obtain due to altered mental status.  Past Medical History  She,  has a past medical history of Alcohol abuse, Anxiety, Arthritis, Chronic diastolic CHF (congestive heart  failure) ([place]), Chronic respiratory failure with hypoxia and hypercapnia ([place]) (05/09/2017), Community acquired pneumonia, CVA (cerebral vascular accident) ([place]), Depression, Headache(784.0), Hypertension, Insomnia, Respiratory bronchiolitis interstitial lung disease ([place]), Schizophrenia ([place]), and Seizure ([place]).   Surgical History    Past Surgical History:  Procedure Laterality Date  . BACK SURGERY    . LUNG BIOPSY Left 07/16/2014   Procedure: LUNG BIOPSY;  Surgeon: Grace Isaac, MD;  Location[facility]  OR;  Service: Thoracic;  Laterality: Left;  Marland Kitchen MULTIPLE TOOTH EXTRACTIONS    . RIGHT/LEFT HEART CATH AND CORONARY ANGIOGRAPHY N/A 01/04/2018   Procedure: RIGHT/LEFT HEART CATH AND CORONARY ANGIOGRAPHY;  Surgeon: Adrian Prows, MD;  Location: [place] CV LAB;  Service: Cardiovascular;  Laterality: N/A;  . VIDEO ASSISTED THORACOSCOPY Left 07/16/2014   Procedure: VIDEO ASSISTED THORACOSCOPY;  Surgeon: Grace Isaac, MD;  Location: [place];  Service: Thoracic;  Laterality: Left;  Marland Kitchen VIDEO BRONCHOSCOPY Bilateral 04/11/2014   Procedure: VIDEO BRONCHOSCOPY WITH FLUORO;  Surgeon: Kathee Delton, MD;  Location: [facility] ENDOSCOPY;  Service: Cardiopulmonary;  Laterality: Bilateral;  . VIDEO BRONCHOSCOPY N/A 07/16/2014   Procedure: VIDEO BRONCHOSCOPY;  Surgeon: Grace Isaac, MD;  Location: [facility] OR;  Service: Thoracic;  Laterality: N/A;  . WEDGE RESECTION Left 07/16/2014   Procedure: WEDGE RESECTION;  Surgeon: Grace Isaac, MD;  Location: [place];  Service: Thoracic;  Laterality: Left;  left upper lobe lung resection     Social History   reports that she has been smoking cigarettes. She has a 45.00 pack-year smoking history. She has never used smokeless tobacco. She reports current alcohol use. She reports current drug use. Drug: Cocaine.   Family History   Her family history includes Breast cancer in her mother; COPD in her maternal aunt; Obstructive Sleep Apnea in her mother.   Allergies Allergies    Allergen Reactions  . Unasyn [Ampicillin-Sulbactam Sodium] Swelling and Rash    angioedema  . Contrast Media [Iodinated Diagnostic Agents] Nausea And Vomiting  . Hydralazine Swelling     Home Medications  Prior to Admission medications   Medication Sig Start Date End Date Taking? Authorizing Provider  albuterol (PROVENTIL HFA;VENTOLIN HFA) 108 (90 BASE) MCG/ACT inhaler Inhale 2 puffs into the lungs every 6 (six) hours as needed for wheezing or shortness of breath. 05/09/15  Yes Juanito Doom, MD  albuterol (PROVENTIL) (2.5 MG/3ML) 0.083% nebulizer solution Take 3 mLs (2.5 mg total) by nebulization every 6 (six) hours as needed for wheezing or shortness of breath. 02/06/19  Yes Martyn Ehrich, NP  budesonide-formoterol (SYMBICORT) 160-4.5 MCG/ACT inhaler INHALE TWO puffs into THE lungs TWICE DAILY Patient taking differently: Inhale 2 puffs into the lungs 2 (two) times daily. INHALE TWO puffs into THE lungs TWICE DAILY 02/06/19  Yes Martyn Ehrich, NP  buPROPion [facility] SR) 150 MG 12 hr tablet Take 150 mg by mouth daily. 06/19/19  Yes [provider]  diclofenac sodium (VOLTAREN) 1 % GEL Apply 2 g topically 4 (four) times daily.   Yes [provider]  FLUoxetine (PROZAC) 20 MG capsule Take 20 mg by mouth daily. 06/06/19  Yes [provider]  furosemide (LASIX) 40 MG tablet Take 1 tablet (40 mg total) by mouth 2 (two) times daily. 03/12/19  Yes Lavina Hamman, MD  gabapentin (NEURONTIN) 300 MG capsule Take 300 mg by mouth 3 (three) times daily.   Yes [provider]  levETIRAcetam (KEPPRA) 1000 MG tablet Take 1 tablet (1,000 mg total) by mouth 2 (two) times daily. 04/03/19  Yes Harold Hedge, MD  losartan (COZAAR) 50 MG tablet Take 1 tablet (50 mg total) by mouth daily. 03/02/19  Yes Debbe Odea, MD  metoprolol succinate (TOPROL-XL) 25 MG 24 hr tablet Take 25 mg by mouth daily. 06/06/19  Yes [provider]  OXYGEN Inhale 4 L into the  lungs continuous.    Yes [provider]  QUEtiapine (SEROQUEL) 100 MG tablet Take  1 tablet (100 mg total) by mouth 2 (two) times daily. 04/03/19  Yes Harold Hedge, MD  rosuvastatin (CRESTOR) 10 MG tablet Take 1 tablet (10 mg total) by mouth daily. 01/17/18  Yes Blanchie Dessert, MD  tiZANidine (ZANAFLEX) 4 MG capsule Take 4 mg by mouth 2 (two) times daily as needed for muscle spasms.    Yes [provider]  traZODone (DESYREL) 100 MG tablet Take 100 mg by mouth at bedtime.   Yes [provider]  ARIPiprazole (ABILIFY) 5 MG tablet Take 5 mg by mouth daily. 02/23/19   [provider]     Critical care time:     Tamsen Snider, MD PGY1  [phone]  Attending Note:  54 year old female with PMH of COPD who presents with acute on chronic hypercarbic respiratory failure.  Patient remains acidotic on ABG but mental status is stable on exam.  I reviewed chest CT myself, infiltrate noted.  Discussed with resident.  Will continue BiPAP for now.  If deteriorates will intubate.  Active diureses today.  Replace electrolytes.  PCCM will continue to manage.  The patient is critically ill with multiple organ systems failure and requires high complexity decision making for assessment and support, frequent evaluation and titration of therapies, application of advanced monitoring technologies and extensive interpretation of multiple databases.   Critical Care Time devoted to patient care services described in this note is  32  Minutes. This time reflects time of care of this signee Dr Jennet Maduro. This critical care time does not reflect procedure time, or teaching time or supervisory time of PA/NP/Med student/Med Resident etc but could involve care discussion time.  Rush Farmer, M.D. [facility] Pulmonary/Critical Care Medicine.

## 2019-07-04 NOTE — Progress Notes (Signed)
  Echocardiogram 2D Echocardiogram has been performed.  Keirsten Matuska A Melchor Kirchgessner 07/04/2019, 1:29 PM

## 2019-07-04 NOTE — Progress Notes (Signed)
eLink Physician-Brief Progress Note Patient Name: Diane Gilbert DOB: [birth year] MRN: [id number]   Date of Service  07/04/2019  HPI/Events of Note  Pain - in setting of PNA  eICU Interventions  Low dose morphine (2-48m IV Q4H PRN). Caution with respect to respiratory suppression given hypercapnia.     Intervention Category Intermediate Interventions: Pain - evaluation and management  RMarily LenteStretch 07/04/2019, 1:25 AM

## 2019-07-04 NOTE — Progress Notes (Signed)
eLink Physician-Brief Progress Note Patient Name: Diane Gilbert DOB: 12/01/1965 MRN: [id number]   Date of Service  07/04/2019  HPI/Events of Note  10F smoker with COPD and hx of RB-ILD who presented to ER with acute hypoxemic and hypercarbic respiratory failure, e/o RML and RLL pneumonia and now S.pneumo urinary antigen positivity.  She has been treated with BiPAP, steroids, bronchodilators, and azithro/cefepime and vancomycin so far.  eICU Interventions  Acute hypoxemic/hypercarbic respiratory failure secondary to pneumonia: - Given positive S.pneumo antigen, will switch from cefepime to ceftriaxone 1g Q24H, and discontinue vancomycin. OK to continue azithromycin. - Continue BiPAP. - Continue steroids IV. - Continue DuoNebs. - Repeat ABG in AM. - Patient has at least a moderate risk of respiratory deterioration requiring intubation.  Pain: likely secondary to pneumonia/pleuritis - Low dose morphine 56m IV Q4H PRN  DIET: NPO DVT PPX: Heparin [place] Code Status: Full.     Intervention Category Evaluation Type: New Patient Evaluation  RMarily LenteStretch 07/04/2019, 1:34 AM

## 2019-07-04 NOTE — Progress Notes (Signed)
CRITICAL VALUE ALERT  Critical Value:  PCO2 104  Date & Time Notied:  07/04/2019 0620  Provider Notified: Warren Lacy MD  Orders Received/Actions taken: Awaiting orders

## 2019-07-05 DIAGNOSIS — J441 Chronic obstructive pulmonary disease with (acute) exacerbation: Secondary | ICD-10-CM

## 2019-07-05 DIAGNOSIS — J810 Acute pulmonary edema: Secondary | ICD-10-CM

## 2019-07-05 LAB — CBC
HCT: 50.8 % — ABNORMAL HIGH (ref 36.0–46.0)
Hemoglobin: 14.8 g/dL (ref 12.0–15.0)
MCH: 27.8 pg (ref 26.0–34.0)
MCHC: 29.1 g/dL — ABNORMAL LOW (ref 30.0–36.0)
MCV: 95.3 fL (ref 80.0–100.0)
Platelets: 246 10*3/uL (ref 150–400)
RBC: 5.33 MIL/uL — ABNORMAL HIGH (ref 3.87–5.11)
RDW: 15.1 % (ref 11.5–15.5)
WBC: 9.5 10*3/uL (ref 4.0–10.5)
nRBC: 0.3 % — ABNORMAL HIGH (ref 0.0–0.2)

## 2019-07-05 LAB — POCT I-STAT 7, (LYTES, BLD GAS, ICA,H+H)
Acid-Base Excess: 24 mmol/L — ABNORMAL HIGH (ref 0.0–2.0)
Bicarbonate: 54.3 mmol/L — ABNORMAL HIGH (ref 20.0–28.0)
Calcium, Ion: 1.1 mmol/L — ABNORMAL LOW (ref 1.15–1.40)
HCT: 54 % — ABNORMAL HIGH (ref 36.0–46.0)
Hemoglobin: 18.4 g/dL — ABNORMAL HIGH (ref 12.0–15.0)
O2 Saturation: 90 %
Patient temperature: 97.7
Potassium: 3.5 mmol/L (ref 3.5–5.1)
Sodium: 136 mmol/L (ref 135–145)
TCO2: >50 mmol/L — ABNORMAL HIGH (ref 22–32)
pCO2 arterial: 71.9 mmHg (ref 32.0–48.0)
pH, Arterial: 7.484 — ABNORMAL HIGH (ref 7.350–7.450)
pO2, Arterial: 58 mmHg — ABNORMAL LOW (ref 83.0–108.0)

## 2019-07-05 LAB — BASIC METABOLIC PANEL
Anion gap: 13 (ref 5–15)
BUN: 17 mg/dL (ref 6–20)
CO2: 41 mmol/L — ABNORMAL HIGH (ref 22–32)
Calcium: 8.9 mg/dL (ref 8.9–10.3)
Chloride: 82 mmol/L — ABNORMAL LOW (ref 98–111)
Creatinine, Ser: 0.74 mg/dL (ref 0.44–1.00)
GFR calc Af Amer: >60 mL/min (ref >60)
GFR calc non Af Amer: >60 mL/min (ref >60)
Glucose, Bld: 180 mg/dL — ABNORMAL HIGH (ref 70–99)
Potassium: 3.7 mmol/L (ref 3.5–5.1)
Sodium: 136 mmol/L (ref 135–145)

## 2019-07-05 LAB — HEMOGLOBIN A1C
Hgb A1c MFr Bld: 4.8 % (ref 4.8–5.6)
Mean Plasma Glucose: 91 mg/dL

## 2019-07-05 LAB — GLUCOSE, CAPILLARY
Glucose-Capillary: 133 mg/dL — ABNORMAL HIGH (ref 70–99)
Glucose-Capillary: 144 mg/dL — ABNORMAL HIGH (ref 70–99)
Glucose-Capillary: 147 mg/dL — ABNORMAL HIGH (ref 70–99)
Glucose-Capillary: 157 mg/dL — ABNORMAL HIGH (ref 70–99)
Glucose-Capillary: 161 mg/dL — ABNORMAL HIGH (ref 70–99)
Glucose-Capillary: 181 mg/dL — ABNORMAL HIGH (ref 70–99)
Glucose-Capillary: 216 mg/dL — ABNORMAL HIGH (ref 70–99)

## 2019-07-05 LAB — PHOSPHORUS: Phosphorus: 1.9 mg/dL — ABNORMAL LOW (ref 2.5–4.6)

## 2019-07-05 LAB — MAGNESIUM: Magnesium: 2.1 mg/dL (ref 1.7–2.4)

## 2019-07-05 MED ORDER — POTASSIUM PHOSPHATES 15 MMOLE/5ML IV SOLN
30.0000 mmol | Freq: Once | INTRAVENOUS | Status: DC
  Filled 2019-07-05: qty 10

## 2019-07-05 MED ORDER — POTASSIUM CHLORIDE CRYS ER 20 MEQ PO TBCR
40.0000 meq | EXTENDED_RELEASE_TABLET | Freq: Three times a day (TID) | ORAL | Status: AC
  Administered 2019-07-05 (×2): 40 meq via ORAL
  Filled 2019-07-05 (×2): qty 2

## 2019-07-05 MED ORDER — METOLAZONE 5 MG PO TABS
10.0000 mg | ORAL_TABLET | Freq: Once | ORAL | Status: AC
  Administered 2019-07-06: 10 mg via ORAL
  Filled 2019-07-05 (×2): qty 2
  Filled 2019-07-05: qty 1

## 2019-07-05 MED ORDER — LEVOFLOXACIN 500 MG PO TABS
750.0000 mg | ORAL_TABLET | Freq: Every day | ORAL | Status: DC
  Administered 2019-07-06 – 2019-07-08 (×3): 750 mg via ORAL
  Filled 2019-07-05 (×3): qty 2

## 2019-07-05 MED ORDER — POTASSIUM PHOSPHATES 15 MMOLE/5ML IV SOLN
30.0000 mmol | Freq: Once | INTRAVENOUS | Status: DC
  Administered 2019-07-05: 30 mmol via INTRAVENOUS
  Filled 2019-07-05: qty 10

## 2019-07-05 MED ORDER — FUROSEMIDE 10 MG/ML IJ SOLN
40.0000 mg | Freq: Four times a day (QID) | INTRAMUSCULAR | Status: AC
  Administered 2019-07-05 (×3): 40 mg via INTRAVENOUS
  Filled 2019-07-05 (×3): qty 4

## 2019-07-05 MED ORDER — PREDNISONE 10 MG PO TABS: 10.0000 mg | ORAL_TABLET | Freq: Every day | ORAL | Status: DC

## 2019-07-05 MED ORDER — LEVETIRACETAM 500 MG PO TABS
1000.0000 mg | ORAL_TABLET | Freq: Two times a day (BID) | ORAL | Status: DC
  Administered 2019-07-05 – 2019-07-06 (×3): 1000 mg via ORAL
  Filled 2019-07-05 (×4): qty 2

## 2019-07-05 MED ORDER — FUROSEMIDE 10 MG/ML IJ SOLN
40.0000 mg | Freq: Four times a day (QID) | INTRAMUSCULAR | Status: DC
  Administered 2019-07-05: 40 mg via INTRAVENOUS
  Filled 2019-07-05: qty 4

## 2019-07-05 MED ORDER — PREDNISONE 50 MG PO TABS
50.0000 mg | ORAL_TABLET | Freq: Every day | ORAL | Status: AC
  Administered 2019-07-06 – 2019-07-11 (×6): 50 mg via ORAL
  Filled 2019-07-05 (×4): qty 2
  Filled 2019-07-05 (×2): qty 1

## 2019-07-05 NOTE — Plan of Care (Signed)

## 2019-07-05 NOTE — Progress Notes (Addendum)
NAME:  Diane Gilbert, MRN:  [id number], DOB:  09/04/1965, LOS: 2 ADMISSION DATE:  07/02/2019, CONSULTATION DATE:  07/03/2019 REFERRING MD:  Dr. Sherry Ruffing, ER, CHIEF COMPLAINT:  Short of breath, chest pain   Brief History   54 yo female smoker presented to ER with chest discomfort and dyspnea.  She has hx of respiratory bronchiolitis with interstitial lung disease (RB ILD) on prednisone 10 mg daily, emphysema and chronic hypoxic respiratory failure on 4 liters oxygen.  She was found to have Lt MCL avulsion, and superimposed comminuted spiral fx of proximal fibula on 06/19/19.  Found to have acute hypoxia and hypercapnia, and CT chest showed changes of pneumonia.  COVID 19 test negative.  Started on Bipap, ABx, steroids.  PCCM asked to admit to ICU.  Past Medical History  CVA. Seizure, Chronic diastolic CHF, Schizophrenia  Significant Hospital Events   1/04 Admit  Consults:    Procedures:    Significant Diagnostic Tests:  CT angio chest 1/04 >> patchy opacities RML and RLL, mild GGO b/l, air trapping, mild centrilobular emphysema  Micro Data:  SARS CoV2 PCR 1/04 >> negative Influenza PCR 1/04 >> A and B negative Pneumococcal Ag 1/04 >> positive Legionella Ag 1/04 >> negative  Blood 1/04 >> NGTD  Antimicrobials:  Rocephin 1/04 Zithromax 1/04 Vancomycin 1/04  Cefepime 1/04   Ceftriaxone 1/04 -->    Interim history/subjective:  Patient on Bipap,left leg pain and chest pain this morning .   Objective   Blood pressure (!) 187/110, pulse 83, temperature 97.7 F (36.5 C), temperature source Axillary, resp. rate 20, height _0  (1.626 m), weight 110 kg, last menstrual period 04/06/2017, SpO2 93 %.    Vent Mode: PCV FiO2 (%):  [30 %-50 %] 30 % Set Rate:  [24 bmp] 24 bmp PEEP:  [8 cmH20] 8 cmH20   Intake/Output Summary (Last 24 hours) at 07/05/2019 0829 Last data filed at 07/05/2019 0600 Gross per 24 hour  Intake 119.47 ml  Output 4750 ml  Net -4630.53 ml   Filed Weights    07/04/19 0049 07/04/19 0445 07/05/19 0500  Weight: 108.2 kg 108.2 kg 110 kg    Examination:  General -Sitting up in bed , NAD Cardiac - RRR, no murmur.  Chest - Rales bilaterally at bases, wheezing in mid lung fields Abdomen - soft, non tender, distended,  + bowel sounds Extremities - 1+ edema Skin - no rashes Neuro - alert and oriented x 3    Resolved Hospital Problem list     Assessment & Plan:   Acute on chronic hypoxic/hypercapnic respiratory failure in setting CAP  History of RB ILD on chronic prednisone therapy, emphysema with continue cigarette smoking, and obesity hypoventilation syndrome. Strep urine antigen positive.  P: - Continue Bipap - Duonebs Q6hrs  - Solumedrol 60 q6h - Continue Ceftriaxone  - PRN ABG - Trend CBC - Monitor to determine if needs ETT - Had negative sleep study as outpt for OSA >> likely will need NIPPV set up as outpt for chronic respiratory failure  HFpEF -TTE on 1/5 shows grade 2 diastolic function with EF 60-65% - Weight 110kg , weight 99kg on discharge 02/2019. -  net negative 4.8L. Weight inconsistent ,increased with diuresis  P: - Continue lasix IV 40 mg Q6hrs - Hold outpt cozaar, toprol, crestor for now  Hx of CVA with seizure disorder. - Continue keppra IV  Hx of schizophrenia. - Hold outpt wellbutrin, prozac, neurontin, seroquel, zanaflex, trazodone, abilify for now  Steroid induced  hyperglycemia. - SSI  Lt MCL avulsion, and superimposed comminuted spiral fx of proximal fibula on 06/19/19 - Will need outpt f/u with orthopedics - F/u doppler legs  Best practice:  Diet: NPO DVT prophylaxis: SQ heparin GI prophylaxis: not indicated Mobility: bed rest Code Status: full code Disposition: ICU  Labs   CBC: Recent Labs  Lab 07/02/19 1736 07/03/19 1506 07/04/19 0540 07/05/19 0120  WBC 8.6 10.7* 8.1 9.5  NEUTROABS  --  7.6  --   --   HGB 13.8 13.8 14.1 14.8  HCT 50.6* 51.7* 51.1* 50.8*  MCV 102.2* 104.2* 100.0  95.3  PLT 210 216 216 076    Basic Metabolic Panel: Recent Labs  Lab 07/02/19 1736 07/03/19 1506 07/04/19 0540 07/05/19 0120  NA 138 143 145 136  K 4.1 4.4 4.5 3.7  CL 93* 92* 92* 82*  CO2 39* 42* 40* 41*  GLUCOSE 149* 136* 108* 180*  BUN _0 CREATININE 0.70 0.87 0.63 0.74  CALCIUM 8.8* 8.8* 8.8* 8.9  MG  --  1.8  --  2.1  PHOS  --  4.6  --  1.9*   GFR: Estimated Creatinine Clearance: 100.9 mL/min (by C-G formula based on SCr of 0.74 mg/dL). Recent Labs  Lab 07/02/19 1736 07/03/19 1506 07/04/19 0540 07/05/19 0120  WBC 8.6 10.7* 8.1 9.5  LATICACIDVEN  --  1.0  --   --     Liver Function Tests: Recent Labs  Lab 07/03/19 1506  AST 31  ALT 16  ALKPHOS 94  BILITOT 1.1  PROT 7.9  ALBUMIN 3.9   No results for input(s): LIPASE, AMYLASE in the last 168 hours. No results for input(s): AMMONIA in the last 168 hours.  ABG    Component Value Date/Time   PHART 7.264 (L) 07/04/2019 0555   PCO2ART 104 (HH) 07/04/2019 0555   PO2ART 81.8 (L) 07/04/2019 0555   HCO3 46.0 (H) 07/04/2019 0555   TCO2 39 (H) 03/30/2019 0407   O2SAT 96.0 07/04/2019 0555     Coagulation Profile: No results for input(s): INR, PROTIME in the last 168 hours.  Cardiac Enzymes: No results for input(s): CKTOTAL, CKMB, CKMBINDEX, TROPONINI in the last 168 hours.  HbA1C: Hgb A1c MFr Bld  Date/Time Value Ref Range Status  07/04/2019 05:40 AM 4.8 4.8 - 5.6 % Final    Comment:    (NOTE)         Prediabetes: 5.7 - 6.4         Diabetes: >6.4         Glycemic control for adults with diabetes: <7.0   03/30/2019 02:42 AM 6.0 (H) 4.8 - 5.6 % Final    Comment:    (NOTE) Pre diabetes:          5.7%-6.4% Diabetes:              >6.4% Glycemic control for   <7.0% adults with diabetes     CBG: Recent Labs  Lab 07/04/19 1558 07/04/19 1953 07/05/19 0001 07/05/19 0428 07/05/19 0736  GLUCAP 125* 217* 216* 133* 181*    Review of Systems:   Left leg pain. No chest pain, abdominal pain.    Past Medical History  She,  has a past medical history of Alcohol abuse, Anxiety, Arthritis, Chronic diastolic CHF (congestive heart failure) ([place]), Chronic respiratory failure with hypoxia and hypercapnia ([place]) (05/09/2017), Community acquired pneumonia, CVA (cerebral vascular accident) ([place]), Depression, Headache(784.0), Hypertension, Insomnia, Respiratory bronchiolitis interstitial lung disease ([place]), Schizophrenia ([place]), and  Seizure ([place]).   Surgical History    Past Surgical History:  Procedure Laterality Date  . BACK SURGERY    . LUNG BIOPSY Left 07/16/2014   Procedure: LUNG BIOPSY;  Surgeon: Grace Isaac, MD;  Location: [facility];  Service: Thoracic;  Laterality: Left;  Marland Kitchen MULTIPLE TOOTH EXTRACTIONS    . RIGHT/LEFT HEART CATH AND CORONARY ANGIOGRAPHY N/A 01/04/2018   Procedure: RIGHT/LEFT HEART CATH AND CORONARY ANGIOGRAPHY;  Surgeon: Adrian Prows, MD;  Location: [facility];  Service: Cardiovascular;  Laterality: N/A;  . VIDEO ASSISTED THORACOSCOPY Left 07/16/2014   Procedure: VIDEO ASSISTED THORACOSCOPY;  Surgeon: Grace Isaac, MD;  Location: [facility];  Service: Thoracic;  Laterality: Left;  Marland Kitchen VIDEO BRONCHOSCOPY Bilateral 04/11/2014   Procedure: VIDEO BRONCHOSCOPY WITH FLUORO;  Surgeon: Kathee Delton, MD;  Location: [facility];  Service: Cardiopulmonary;  Laterality: Bilateral;  . VIDEO BRONCHOSCOPY N/A 07/16/2014   Procedure: VIDEO BRONCHOSCOPY;  Surgeon: Grace Isaac, MD;  Location: [facility];  Service: Thoracic;  Laterality: N/A;  . WEDGE RESECTION Left 07/16/2014   Procedure: WEDGE RESECTION;  Surgeon: Grace Isaac, MD;  Location: [facility];  Service: Thoracic;  Laterality: Left;  left upper lobe lung resection     Social History   reports that she has been smoking cigarettes. She has a 45.00 pack-year smoking history. She has never used smokeless tobacco. She reports current alcohol use. She reports current drug use. Drug: Cocaine.   Family History   Her family  history includes Breast cancer in her mother; COPD in her maternal aunt; Obstructive Sleep Apnea in her mother.   Allergies Allergies  Allergen Reactions  . Unasyn [Ampicillin-Sulbactam Sodium] Swelling and Rash    angioedema  . Contrast Media [Iodinated Diagnostic Agents] Nausea And Vomiting  . Hydralazine Swelling     Home Medications  Prior to Admission medications   Medication Sig Start Date End Date Taking? Authorizing Provider  albuterol (PROVENTIL HFA;VENTOLIN HFA) 108 (90 BASE) MCG/ACT inhaler Inhale 2 puffs into the lungs every 6 (six) hours as needed for wheezing or shortness of breath. 05/09/15  Yes Juanito Doom, MD  albuterol (PROVENTIL) (2.5 MG/3ML) 0.083% nebulizer solution Take 3 mLs (2.5 mg total) by nebulization every 6 (six) hours as needed for wheezing or shortness of breath. 02/06/19  Yes Martyn Ehrich, NP  budesonide-formoterol (SYMBICORT) 160-4.5 MCG/ACT inhaler INHALE TWO puffs into THE lungs TWICE DAILY Patient taking differently: Inhale 2 puffs into the lungs 2 (two) times daily. INHALE TWO puffs into THE lungs TWICE DAILY 02/06/19  Yes Martyn Ehrich, NP  buPROPion [facility] SR) 150 MG 12 hr tablet Take 150 mg by mouth daily. 06/19/19  Yes [provider]  diclofenac sodium (VOLTAREN) 1 % GEL Apply 2 g topically 4 (four) times daily.   Yes [provider]  FLUoxetine (PROZAC) 20 MG capsule Take 20 mg by mouth daily. 06/06/19  Yes [provider]  furosemide (LASIX) 40 MG tablet Take 1 tablet (40 mg total) by mouth 2 (two) times daily. 03/12/19  Yes Lavina Hamman, MD  gabapentin (NEURONTIN) 300 MG capsule Take 300 mg by mouth 3 (three) times daily.   Yes [provider]  levETIRAcetam (KEPPRA) 1000 MG tablet Take 1 tablet (1,000 mg total) by mouth 2 (two) times daily. 04/03/19  Yes Harold Hedge, MD  losartan (COZAAR) 50 MG tablet Take 1 tablet (50 mg total) by mouth daily. 03/02/19  Yes Debbe Odea, MD  metoprolol  succinate (  TOPROL-XL) 25 MG 24 hr tablet Take 25 mg by mouth daily. 06/06/19  Yes [provider]  OXYGEN Inhale 4 L into the lungs continuous.    Yes [provider]  QUEtiapine (SEROQUEL) 100 MG tablet Take 1 tablet (100 mg total) by mouth 2 (two) times daily. 04/03/19  Yes Harold Hedge, MD  rosuvastatin (CRESTOR) 10 MG tablet Take 1 tablet (10 mg total) by mouth daily. 01/17/18  Yes Blanchie Dessert, MD  tiZANidine (ZANAFLEX) 4 MG capsule Take 4 mg by mouth 2 (two) times daily as needed for muscle spasms.    Yes [provider]  traZODone (DESYREL) 100 MG tablet Take 100 mg by mouth at bedtime.   Yes [provider]  ARIPiprazole (ABILIFY) 5 MG tablet Take 5 mg by mouth daily. 02/23/19   [provider]     Critical care time:     Tamsen Snider, MD PGY1   Attending Note:  54 year old female with CHF and COPD history presenting with an exacerbation of both with respiratory failure and AMS requiring BiPAP.  Has not been on BiPAP since 1/5 at 6 PM, refused it overnight.  This AM, on exam she is lethargic but arousable.  I reviewed CXR myself, pulmonary edema and COPD noted.  Discussed with residents.  Active diureses today.  Check ABG now, if no hypercarbia and acidosis will consider transferring out of the ICU.  Monitor in the ICU for now.  The patient is critically ill with multiple organ systems failure and requires high complexity decision making for assessment and support, frequent evaluation and titration of therapies, application of advanced monitoring technologies and extensive interpretation of multiple databases.   Critical Care Time devoted to patient care services described in this note is  31  Minutes. This time reflects time of care of this signee Dr Jennet Maduro. This critical care time does not reflect procedure time, or teaching time or supervisory time of PA/NP/Med student/Med Resident etc but could involve care discussion time.  Rush Farmer, M.D. [facility]/Critical [facility] Medicine.

## 2019-07-06 DIAGNOSIS — I5033 Acute on chronic diastolic (congestive) heart failure: Secondary | ICD-10-CM | POA: Diagnosis present

## 2019-07-06 DIAGNOSIS — F209 Schizophrenia, unspecified: Secondary | ICD-10-CM

## 2019-07-06 DIAGNOSIS — G9341 Metabolic encephalopathy: Secondary | ICD-10-CM | POA: Clinically undetermined

## 2019-07-06 DIAGNOSIS — G40909 Epilepsy, unspecified, not intractable, without status epilepticus: Secondary | ICD-10-CM

## 2019-07-06 DIAGNOSIS — J189 Pneumonia, unspecified organism: Principal | ICD-10-CM

## 2019-07-06 DIAGNOSIS — Z8673 Personal history of transient ischemic attack (TIA), and cerebral infarction without residual deficits: Secondary | ICD-10-CM

## 2019-07-06 DIAGNOSIS — I5032 Chronic diastolic (congestive) heart failure: Secondary | ICD-10-CM

## 2019-07-06 DIAGNOSIS — E662 Morbid (severe) obesity with alveolar hypoventilation: Secondary | ICD-10-CM

## 2019-07-06 LAB — GLUCOSE, CAPILLARY
Glucose-Capillary: 144 mg/dL — ABNORMAL HIGH (ref 70–99)
Glucose-Capillary: 91 mg/dL (ref 70–99)
Glucose-Capillary: 142 mg/dL — ABNORMAL HIGH (ref 70–99)
Glucose-Capillary: 359 mg/dL — ABNORMAL HIGH (ref 70–99)
Glucose-Capillary: 311 mg/dL — ABNORMAL HIGH (ref 70–99)
Glucose-Capillary: 150 mg/dL — ABNORMAL HIGH (ref 70–99)

## 2019-07-06 LAB — BLOOD GAS, ARTERIAL
Acid-Base Excess: 16.9 mmol/L — ABNORMAL HIGH (ref 0.0–2.0)
Acid-Base Excess: 15.4 mmol/L — ABNORMAL HIGH (ref 0.0–2.0)
Bicarbonate: 42.1 mmol/L — ABNORMAL HIGH (ref 20.0–28.0)
Bicarbonate: 41 mmol/L — ABNORMAL HIGH (ref 20.0–28.0)
Drawn by: 350431
FIO2: 36
FIO2: 40
O2 Saturation: 92.2 %
O2 Saturation: 94.9 %
Patient temperature: 37
Patient temperature: 36.7
pCO2 arterial: 61.4 mmHg — ABNORMAL HIGH (ref 32.0–48.0)
pCO2 arterial: 65.4 mmHg (ref 32.0–48.0)
pH, Arterial: 7.451 — ABNORMAL HIGH (ref 7.350–7.450)
pH, Arterial: 7.412 (ref 7.350–7.450)
pO2, Arterial: 67 mmHg — ABNORMAL LOW (ref 83.0–108.0)
pO2, Arterial: 75.1 mmHg — ABNORMAL LOW (ref 83.0–108.0)

## 2019-07-06 LAB — PHOSPHORUS: Phosphorus: 3 mg/dL (ref 2.5–4.6)

## 2019-07-06 LAB — CBC
HCT: 57.8 % — ABNORMAL HIGH (ref 36.0–46.0)
Hemoglobin: 17.2 g/dL — ABNORMAL HIGH (ref 12.0–15.0)
MCH: 27.4 pg (ref 26.0–34.0)
MCHC: 29.8 g/dL — ABNORMAL LOW (ref 30.0–36.0)
MCV: 92.2 fL (ref 80.0–100.0)
Platelets: 258 10*3/uL (ref 150–400)
RBC: 6.27 MIL/uL — ABNORMAL HIGH (ref 3.87–5.11)
RDW: 15.3 % (ref 11.5–15.5)
WBC: 11.9 10*3/uL — ABNORMAL HIGH (ref 4.0–10.5)
nRBC: 0 % (ref 0.0–0.2)

## 2019-07-06 LAB — BASIC METABOLIC PANEL
Anion gap: 13 (ref 5–15)
BUN: 17 mg/dL (ref 6–20)
CO2: 44 mmol/L — ABNORMAL HIGH (ref 22–32)
Calcium: 9.3 mg/dL (ref 8.9–10.3)
Chloride: 81 mmol/L — ABNORMAL LOW (ref 98–111)
Creatinine, Ser: 0.76 mg/dL (ref 0.44–1.00)
GFR calc Af Amer: >60 mL/min (ref >60)
GFR calc non Af Amer: >60 mL/min (ref >60)
Glucose, Bld: 117 mg/dL — ABNORMAL HIGH (ref 70–99)
Potassium: 3.5 mmol/L (ref 3.5–5.1)
Sodium: 138 mmol/L (ref 135–145)

## 2019-07-06 LAB — MAGNESIUM: Magnesium: 2.1 mg/dL (ref 1.7–2.4)

## 2019-07-06 MED ORDER — FUROSEMIDE 40 MG PO TABS
40.0000 mg | ORAL_TABLET | Freq: Two times a day (BID) | ORAL | Status: DC
  Administered 2019-07-06 – 2019-07-08 (×5): 40 mg via ORAL
  Filled 2019-07-06 (×5): qty 1

## 2019-07-06 MED ORDER — FLUTICASONE PROPIONATE 50 MCG/ACT NA SUSP
2.0000 | Freq: Every day | NASAL | Status: DC
  Administered 2019-07-06 – 2019-07-11 (×6): 2 via NASAL
  Filled 2019-07-06: qty 16

## 2019-07-06 MED ORDER — BUPROPION HCL ER (SR) 150 MG PO TB12
150.0000 mg | ORAL_TABLET | Freq: Every day | ORAL | Status: DC
  Administered 2019-07-06: 150 mg via ORAL
  Filled 2019-07-06: qty 1

## 2019-07-06 MED ORDER — TRAZODONE HCL 100 MG PO TABS: 100.0000 mg | ORAL_TABLET | Freq: Every day | ORAL | Status: DC

## 2019-07-06 MED ORDER — BUDESONIDE 0.5 MG/2ML IN SUSP
0.5000 mg | Freq: Two times a day (BID) | RESPIRATORY_TRACT | Status: DC
  Administered 2019-07-06 – 2019-07-12 (×12): 0.5 mg via RESPIRATORY_TRACT
  Filled 2019-07-06 (×11): qty 2

## 2019-07-06 MED ORDER — POTASSIUM CHLORIDE CRYS ER 20 MEQ PO TBCR
40.0000 meq | EXTENDED_RELEASE_TABLET | Freq: Once | ORAL | Status: AC
  Administered 2019-07-06: 40 meq via ORAL
  Filled 2019-07-06: qty 2

## 2019-07-06 MED ORDER — ROSUVASTATIN CALCIUM 5 MG PO TABS
10.0000 mg | ORAL_TABLET | Freq: Every day | ORAL | Status: DC
  Administered 2019-07-06 – 2019-07-11 (×6): 10 mg via ORAL
  Filled 2019-07-06 (×6): qty 2

## 2019-07-06 MED ORDER — METOPROLOL SUCCINATE ER 25 MG PO TB24
25.0000 mg | ORAL_TABLET | Freq: Every day | ORAL | Status: DC
  Administered 2019-07-06 – 2019-07-07 (×2): 25 mg via ORAL
  Filled 2019-07-06 (×2): qty 1

## 2019-07-06 MED ORDER — TRAZODONE HCL 100 MG PO TABS
100.0000 mg | ORAL_TABLET | Freq: Every evening | ORAL | Status: DC | PRN
  Administered 2019-07-07 – 2019-07-11 (×4): 100 mg via ORAL
  Filled 2019-07-06 (×4): qty 1

## 2019-07-06 MED ORDER — ASPIRIN 81 MG PO CHEW
81.0000 mg | CHEWABLE_TABLET | Freq: Every day | ORAL | Status: DC
  Administered 2019-07-06 – 2019-07-11 (×6): 81 mg via ORAL
  Filled 2019-07-06 (×6): qty 1

## 2019-07-06 MED ORDER — LEVETIRACETAM 500 MG PO TABS
1000.0000 mg | ORAL_TABLET | Freq: Two times a day (BID) | ORAL | Status: DC
  Administered 2019-07-06 – 2019-07-11 (×11): 1000 mg via ORAL
  Filled 2019-07-06 (×12): qty 2

## 2019-07-06 MED ORDER — LORATADINE 10 MG PO TABS
10.0000 mg | ORAL_TABLET | Freq: Every day | ORAL | Status: DC
  Administered 2019-07-06 – 2019-07-11 (×6): 10 mg via ORAL
  Filled 2019-07-06 (×6): qty 1

## 2019-07-06 MED ORDER — QUETIAPINE FUMARATE 100 MG PO TABS
100.0000 mg | ORAL_TABLET | Freq: Two times a day (BID) | ORAL | Status: DC
  Administered 2019-07-06: 100 mg via ORAL
  Filled 2019-07-06: qty 1

## 2019-07-06 MED ORDER — LOSARTAN POTASSIUM 50 MG PO TABS
50.0000 mg | ORAL_TABLET | Freq: Every day | ORAL | Status: DC
  Administered 2019-07-06 – 2019-07-08 (×3): 50 mg via ORAL
  Filled 2019-07-06 (×3): qty 1

## 2019-07-06 MED ORDER — FLUOXETINE HCL 20 MG PO CAPS
20.0000 mg | ORAL_CAPSULE | Freq: Every day | ORAL | Status: DC
  Administered 2019-07-06: 20 mg via ORAL
  Filled 2019-07-06: qty 1

## 2019-07-06 MED ORDER — GABAPENTIN 300 MG PO CAPS
300.0000 mg | ORAL_CAPSULE | Freq: Three times a day (TID) | ORAL | Status: DC
  Administered 2019-07-06: 300 mg via ORAL
  Filled 2019-07-06: qty 1

## 2019-07-06 MED ORDER — PANTOPRAZOLE SODIUM 40 MG PO TBEC
40.0000 mg | DELAYED_RELEASE_TABLET | Freq: Every day | ORAL | Status: DC
  Administered 2019-07-06 – 2019-07-12 (×7): 40 mg via ORAL
  Filled 2019-07-06 (×7): qty 1

## 2019-07-06 NOTE — Progress Notes (Addendum)
NAME:  Diane Gilbert, MRN:  [id number], DOB:  [birth year], LOS: 3 ADMISSION DATE:  07/02/2019, CONSULTATION DATE:  07/03/2019 REFERRING MD:  Dr. Sherry Ruffing, ER, CHIEF COMPLAINT:  Short of breath, chest pain   Brief History   54 yo female smoker presented to ER with chest discomfort and dyspnea.  She has hx of respiratory bronchiolitis with interstitial lung disease (RB ILD) on prednisone 10 mg daily, emphysema and chronic hypoxic respiratory failure on 4 liters oxygen.  She was found to have Lt MCL avulsion, and superimposed comminuted spiral fx of proximal fibula on 06/19/19.  Found to have acute hypoxia and hypercapnia, and CT chest showed changes of pneumonia.  COVID 19 test negative.  Started on Bipap, ABx, steroids.  PCCM asked to admit to ICU.  Past Medical History  CVA. Seizure, Chronic diastolic CHF, Schizophrenia  Significant Hospital Events   1/04 Admit  Consults:  PCCM  Procedures:    Significant Diagnostic Tests:  CT angio chest 1/04 >> patchy opacities RML and RLL, mild GGO b/l, air trapping, mild centrilobular emphysema  Micro Data:  SARS CoV2 PCR 1/04 >> negative Influenza PCR 1/04 >> A and B negative Pneumococcal Ag 1/04 >> positive Legionella Ag 1/04 >> negative  Blood 1/04 >> NGTD  Antimicrobials:  Rocephin 1/04 Zithromax 1/04 Vancomycin 1/04  Cefepime 1/04   Ceftriaxone 1/04 > 1/07   Interim history/subjective:  Lying in bed, sleepy but easily arousable. Appears she did not wear BIPAP/CPAP overnight.   Objective   Blood pressure (!) 150/92, pulse 69, temperature 97.6 F (36.4 C), resp. rate (!) 33, height _0  (1.626 m), weight 110 kg, last menstrual period 04/06/2017, SpO2 90 %.    FiO2 (%):  [30 %] 30 %   Intake/Output Summary (Last 24 hours) at 07/06/2019 0931 Last data filed at 07/06/2019 0422 Gross per 24 hour  Intake --  Output 7050 ml  Net -7050 ml   Filed Weights   07/04/19 0049 07/04/19 0445 07/05/19 0500  Weight: 108.2 kg 108.2 kg  110 kg    Examination: General: Chronically ill appearing elderly female lying in bed, in NAD HEENT: [place]/AT, MM pink/moist, PERRL,  Neuro: Alert and oriented but sleepy, will awake to verbal stimuli easily. CV: s1s2 regular rate and rhythm, no murmur, rubs, or gallops,  PULM:  Diminished bilaterally, no added breath sounds, no increased work of breathing GI: soft, bowel sounds active in all 4 quadrants, non-tender, non-distended,  Extremities: warm/dry, no edema  Skin: no rashes or lesions   Resolved Hospital Problem list     Assessment & Plan:   Acute on chronic hypoxic/hypercapnic respiratory failure in setting CAP  Hx of Respiratory bronchiolitis interstitial lung disease  -History of RB ILD on chronic prednisone therapy, emphysema with continue cigarette smoking, and obesity hypoventilation syndrome. Strep urine antigen positive.  -Had negative sleep study as outpt for OSA >> likely will need NIPPV set up as outpt for chronic respiratory failure -Followed by [facility] as outpatient  -Chronically utilizes 4L [place] 24/7 but compliance has been a barrier  Plan: Continue supplemental oxygen, Sat goal of > 88% BIPAP as needed  Continue Duonebs q6hrs Completed 3 days of IV ceftriaxone, transitioned to PO Levaquin 01/07 Trend CBC Scheduled for repeat PFT 07/14/2019 at 1600 Follow up apt in place with Dr. Valeta Harms 07/26/2019 at 55 Would benefit from NIV at discharge due to chronic hypoxic and hypercapnic respiratory failure    HFpEF -TTE on 1/5 shows grade 2 diastolic function with  EF 60-65% -Weight 110kg , weight 99kg on discharge 02/2019. -net negative 4.8L. Weight inconsistent ,increased with diuresis  Plan: Resume home PO lasix  Continue home Cozar, toprol, and crestor  Daily weight  Intake and output   Hx of CVA with seizure disorder. Plan: Keppra transitioned to PO, continue   Hx of schizophrenia. Plan: Home welbutrin, prozac, neurontin, seroquel, trazadone, and  abilify resumed   Steroid induced hyperglycemia. Plan: SSI while inpatient   Lt MCL avulsion, and superimposed comminuted spiral fx of proximal fibula on 06/19/19 -LE doppler negative for DVT  Plan: Will need outpatient follow up with ortho   PCCM will sign off. Thank you for the opportunity to participate in this patient's care. Please contact if we can be of further assistance.  Best practice:  Diet: heart healthy  DVT prophylaxis: SQ heparin GI prophylaxis: not indicated Mobility: bed rest Code Status: full code Disposition: Floor   Labs   CBC: Recent Labs  Lab 07/02/19 1736 07/03/19 1506 07/04/19 0540 07/05/19 0120 07/05/19 1030 07/06/19 0440  WBC 8.6 10.7* 8.1 9.5  --  11.9*  NEUTROABS  --  7.6  --   --   --   --   HGB 13.8 13.8 14.1 14.8 18.4* 17.2*  HCT 50.6* 51.7* 51.1* 50.8* 54.0* 57.8*  MCV 102.2* 104.2* 100.0 95.3  --  92.2  PLT 210 216 216 246  --  546    Basic Metabolic Panel: Recent Labs  Lab 07/02/19 1736 07/03/19 1506 07/04/19 0540 07/05/19 0120 07/05/19 1030 07/06/19 0440  NA 138 143 145 136 136 138  K 4.1 4.4 4.5 3.7 3.5 3.5  CL 93* 92* 92* 82*  --  81*  CO2 39* 42* 40* 41*  --  44*  GLUCOSE 149* 136* 108* 180*  --  117*  BUN _0 --  17  CREATININE 0.70 0.87 0.63 0.74  --  0.76  CALCIUM 8.8* 8.8* 8.8* 8.9  --  9.3  MG  --  1.8  --  2.1  --  2.1  PHOS  --  4.6  --  1.9*  --  3.0   GFR: Estimated Creatinine Clearance: 100.9 mL/min (by C-G formula based on SCr of 0.76 mg/dL). Recent Labs  Lab 07/03/19 1506 07/04/19 0540 07/05/19 0120 07/06/19 0440  WBC 10.7* 8.1 9.5 11.9*  LATICACIDVEN 1.0  --   --   --     Liver Function Tests: Recent Labs  Lab 07/03/19 1506  AST 31  ALT 16  ALKPHOS 94  BILITOT 1.1  PROT 7.9  ALBUMIN 3.9   No results for input(s): LIPASE, AMYLASE in the last 168 hours. No results for input(s): AMMONIA in the last 168 hours.  ABG    Component Value Date/Time   PHART 7.484 (H) 07/05/2019 1030     PCO2ART 71.9 (HH) 07/05/2019 1030   PO2ART 58.0 (L) 07/05/2019 1030   HCO3 54.3 (H) 07/05/2019 1030   TCO2 >50 (H) 07/05/2019 1030   O2SAT 90.0 07/05/2019 1030     Coagulation Profile: No results for input(s): INR, PROTIME in the last 168 hours.  Cardiac Enzymes: No results for input(s): CKTOTAL, CKMB, CKMBINDEX, TROPONINI in the last 168 hours.  HbA1C: Hgb A1c MFr Bld  Date/Time Value Ref Range Status  07/04/2019 05:40 AM 4.8 4.8 - 5.6 % Final    Comment:    (NOTE)         Prediabetes: 5.7 - 6.4  Diabetes: >6.4         Glycemic control for adults with diabetes: <7.0   03/30/2019 02:42 AM 6.0 (H) 4.8 - 5.6 % Final    Comment:    (NOTE) Pre diabetes:          5.7%-6.4% Diabetes:              >6.4% Glycemic control for   <7.0% adults with diabetes     CBG: Recent Labs  Lab 07/05/19 1620 07/05/19 2141 07/05/19 2314 07/06/19 0419 07/06/19 0736  GLUCAP 157* 147* 144* 144* 91   Critical care time:    Johnsie Cancel, NP-C [facility] Contact / Pager information can be found on Amion  07/06/2019, 9:55 AM   PCCM attending:  55 year old admitted for hypoxic respiratory failure history of respiratory bronchiolitis with interstitial lung disease on 10 mg prednisone, emphysema, chronic hypoxemic respiratory failure on 4 L, history of CVA history of chronic diastolic heart failure.  Admitted to the intensive care unit Covid negative, pneumococcal antigen positive treated for pneumonia as well as on BiPAP therapy.  Transferred out of the intensive care unit yesterday.  Seen today on the floor somnolent.  However responsive to voice.  Follows commands.  Unfortunately patient did not wear her BiPAP last night.  BP 131/90 (BP Location: Right Arm)   Pulse 100   Temp 98 F (36.7 C) (Oral)   Resp 20   Ht _0  (1.626 m)   Wt 110 kg   LMP 04/06/2017   SpO2 96%   BMI 41.63 kg/m   General: Resting in bed somnolent but arousable to voice  follows commands. Heart: Regular rate rhythm S1-S2 Lungs: Bilateral expiratory wheezing. Abdomen: Morbidly obese pannus soft nontender Extremities: No significant edema.  CT scan: Reviewed, HRCT scan from several months ago reviewed. Chest x-ray: Enlarged cardiac silhouette bilateral pulmonary infiltrates. The patient's images have been independently reviewed by me.    Assessment: Acute on chronic hypoxemic hypercapnic respiratory failure in the setting of pneumonia, history of respiratory bronchiolitis with interstitial lung disease which is a smoking-related problem and this will not get better as long as she continues to smoke.  Morbid obesity with obesity hypoventilation syndrome and hypercarbic failure.  She did have a negative sleep study outpatient. Chronic diastolic heart failure, heart failure with preserved ejection fraction, grade 2 diastolic dysfunction on previous echocardiogram  Plan: Patient must quit smoking. Patient was counseled on smoking cessation today at bedside and the importance of this. Patient would likely benefit from NIPPV/BiPAP therapy at discharge.  To be used for diagnosis of chronic hypercapnic respiratory failure and obesity hypoventilation. We have placed a consultation to our case management to help obtain this at time of discharge. We have also arrange follow-up in our pulmonary clinic to be seen by one of our sleep physicians. Overall I do believe she is also well diuresed.  We will help arrange follow-up and ensure needs are met at time of discharge.  Please call if needed.  Pulmonary will sign off at this time.  [facility] 07/06/2019 2:23 PM

## 2019-07-06 NOTE — Progress Notes (Addendum)
Bipap was PRN order, new order for qhs.  Not sure if patient will wear but will get ABG and try to encourage patient to wear.  No distress noted.

## 2019-07-06 NOTE — Progress Notes (Signed)
Did ABG on patient, went and got Bipap Biomedical engineer) to place on patient.  Patient agreed to try it.  Received call from Lab with critical PaCO2 of 65.4.  Placed patient on Bipap with setting of 10/5.  Bled in 5L O2.  Patient results on ABG were Results for Diane Gilbert, Diane Gilbert (MRN [id number]) as of 07/06/2019 23:10  Ref. Range 07/06/2019 22:50  pH, Arterial Latest Ref Range: 7.350 - 7.450  7.412  pCO2 arterial Latest Ref Range: 32.0 - 48.0 mmHg 65.4 (HH)  pO2, Arterial Latest Ref Range: 83.0 - 108.0 mmHg 75.1 (L)  Acid-Base Excess Latest Ref Range: 0.0 - 2.0 mmol/L 15.4 (H)  Bicarbonate Latest Ref Range: 20.0 - 28.0 mmol/L 41.0 (H)  O2 Saturation Latest Units: % 94.9  Patient temperature Unknown 36.7  Collection site Unknown RIGHT RADIAL  This was on 5L [name] before Bipap placement.  Patient seems to be adapting well to Bipap, no distress noted, will continue to monitor.

## 2019-07-06 NOTE — Progress Notes (Signed)
Asked patient about wearing Bipap.  She stated that they had her wearing one when she came into the Emergency Room.  Patient does not wear either a CPAP nor Bipap at home and has never had a sleep study done.  Made order PRN, no distress noted at this time, will continue to monitor.

## 2019-07-06 NOTE — Progress Notes (Addendum)
PROGRESS NOTE    Diane Gilbert  ERD:[id number] DOB: [birth year] DOA: 07/02/2019 PCP: Nolene Ebbs, MD   Brief Narrative:  54 yo female smoker presented to ER with chest discomfort and dyspnea.  She has hx of respiratory bronchiolitis with interstitial lung disease (RB ILD) on prednisone 10 mg daily, emphysema and chronic hypoxic respiratory failure on 4 liters oxygen.  She was found to have Lt MCL avulsion, and superimposed comminuted spiral fx of proximal fibula on 06/19/19.  Found to have acute hypoxia and hypercapnia, and CT chest showed changes of pneumonia.  COVID 19 test negative.  Started on Bipap, ABx, steroids.  PCCM asked to admit to ICU.  Assessment & Plan:   Principal Problem:   Acute on chronic respiratory failure with hypoxia and hypercapnia (HCC) Active Problems:   Acute on chronic diastolic heart failure (HCC)   Schizophrenia (HCC)   Chronic respiratory failure with hypoxia ([place])   Community acquired pneumonia   Acute respiratory failure with hypoxia (HCC)   Acute respiratory failure with hypoxia and hypercapnia (HCC)   History of CVA (cerebrovascular accident)   Seizure disorder ([place])   Acute metabolic encephalopathy  #1 acute on chronic hypoxemic hypercapnic respiratory failure likely secondary to multilobar pneumonia and acute on chronic diastolic heart failure Patient with a history of respiratory bronchiolitis with interstitial lung disease secondary to smoking per pulmonary.  Patient also with morbid obesity with obesity hypoventilation syndrome and hypercarbic failure.  Patient noted to have had a negative outpatient sleep study.  Patient with some clinical improvement with sats of 96% on 5 L nasal cannula.  Patient on 4 L home O2.  Urine strep pneumococcus antigen was positive.  Chest x-ray worrisome for pneumonia versus bilateral CHF.  CT angiogram chest negative for PE however this show right middle and right lower lobe infiltrate and probable left lower  lobe infiltrate.  Patient with a urine output of 7.850 L over the past 24 hours.  Patient is -12.575 L during this hospitalization.  Patient was on IV antibiotics and has subsequently been transitioned to oral Levaquin.  Increase Pulmicort to 0.5 mg twice daily.  Placed on Flonase, Claritin, PPI.  Continue scheduled duo nebs, steroid taper as recommended by pulmonary.  Per pulmonary patient will benefit from NIPPV/BiPAP therapy at discharge which pulmonary is arranging.  Continue supportive care.  Follow.  2.  Multilobar pneumonia Noted on CT chest.  Patient presented with worsening shortness of breath and increased O2 requirements with acute on chronic hypoxemic and hypercarbic respiratory failure.  Urine strep pneumococcus antigen was positive.  Urine Legionella antigen negative.  Patient currently afebrile.  Patient was on IV antibiotics and has subsequently been transitioned to oral Levaquin to complete course of antibiotic treatment.  See #1.  3.  Acute on chronic diastolic heart failure Patient presented with increased O2 requirements from baseline, chest x-ray concerning for volume overload plus or minus pneumonia.  Patient was placed on IV Lasix and received a dose of metolazone.  Patient with a urine output of 7.850 L over the past 24 hours.  Patient is -12.575 L during this hospitalization.  Current weight is 110 kg from 108.2 kg on admission.  Doubt if weight is accurate.  Patient currently on oral Lasix which we will continue.  Continue Toprol-XL.  Cozaar has been resumed.  Will need outpatient follow-up.  4.  CVA with seizure disorder Stable.  Continue Keppra which has been transitioned to oral.  PT/OT.  Continue Crestor.  Place on aspirin  81 mg daily.  5.  Acute metabolic encephalopathy Patient drowsy this morning however opens eyes to verbal stimuli and answering questions appropriately.  Patient stated did not sleep well last night.  We will hold resuming patient's psychiatric  medications.  Continue treatment as in #1 2 and 3.  Supportive care.  Follow.  6.  History of schizophrenia Patient drowsy this morning.  We will hold home regimen of Wellbutrin, Prozac, Neurontin, Seroquel and Abilify.  Resume trazodone nightly.  7.  Left MCL avulsion, superimposed community spiral fracture of the proximal tibial 06/19/2019 Lower extremity Dopplers negative for DVT.  Outpatient follow-up with orthopedics.   DVT prophylaxis: Heparin Code Status: Full Family Communication: Updated patient.  No family at bedside. Disposition Plan: To be determined   Consultants:   None  Procedures:   CT angiogram chest 07/03/2019  Chest x-ray 07/02/2019, 07/04/2019  Lower extremity Dopplers 07/03/2019  Antimicrobials: Rocephin 1/04 Zithromax 1/04 x1 Vancomycin 1/04 >>>> 07/04/2019 Cefepime 1/04 >>>> 07/04/2019 Ceftriaxone 1/04 > 1/06  Oral Levaquin 07/06/2019   Micro Data:  SARS CoV2 PCR 1/04 >> negative Influenza PCR 1/04 >> A and B negative Pneumococcal Ag 1/04 >> positive Legionella Ag 1/04 >> negative  Blood 1/04 >> NGTD   Subjective: Drowsy.  Opens eyes to verbal stimuli however drifts back off to sleep.  Answers questions appropriately.  Patient states did not sleep adequately last night.  Objective: Vitals:   07/06/19 0225 07/06/19 0300 07/06/19 0735 07/06/19 1135  BP:  (!) 150/92  131/90  Pulse:  73 69 100  Resp:  20 (!) 33 20  Temp:  97.6 F (36.4 C)  98 F (36.7 C)  TempSrc:    Oral  SpO2: 91% 91% 90% 96%  Weight:      Height:        Intake/Output Summary (Last 24 hours) at 07/06/2019 1441 Last data filed at 07/06/2019 0422 Gross per 24 hour  Intake --  Output 3600 ml  Net -3600 ml   Filed Weights   07/04/19 0049 07/04/19 0445 07/05/19 0500  Weight: 108.2 kg 108.2 kg 110 kg    Examination:  General exam: NAD Respiratory system: Diffuse crackles.  No wheezing.  Normal respiratory effort.   Cardiovascular system: S1 & S2 heard, RRR. No JVD, murmurs,  rubs, gallops or clicks. No pedal edema. Gastrointestinal system: Abdomen is nondistended, soft and nontender. No organomegaly or masses felt. Normal bowel sounds heard. Central nervous system: Alert and oriented. No focal neurological deficits. Extremities: Symmetric 5 x 5 power. Skin: No rashes, lesions or ulcers Psychiatry: Judgement and insight appear normal. Mood & affect appropriate.     Data Reviewed: I have personally reviewed following labs and imaging studies  CBC: Recent Labs  Lab 07/02/19 1736 07/03/19 1506 07/04/19 0540 07/05/19 0120 07/05/19 1030 07/06/19 0440  WBC 8.6 10.7* 8.1 9.5  --  11.9*  NEUTROABS  --  7.6  --   --   --   --   HGB 13.8 13.8 14.1 14.8 18.4* 17.2*  HCT 50.6* 51.7* 51.1* 50.8* 54.0* 57.8*  MCV 102.2* 104.2* 100.0 95.3  --  92.2  PLT 210 216 216 246  --  416   Basic Metabolic Panel: Recent Labs  Lab 07/02/19 1736 07/03/19 1506 07/04/19 0540 07/05/19 0120 07/05/19 1030 07/06/19 0440  NA 138 143 145 136 136 138  K 4.1 4.4 4.5 3.7 3.5 3.5  CL 93* 92* 92* 82*  --  81*  CO2 39* 42* 40* 41*  --  44*  GLUCOSE 149* 136* 108* 180*  --  117*  BUN _0 --  17  CREATININE 0.70 0.87 0.63 0.74  --  0.76  CALCIUM 8.8* 8.8* 8.8* 8.9  --  9.3  MG  --  1.8  --  2.1  --  2.1  PHOS  --  4.6  --  1.9*  --  3.0   GFR: Estimated Creatinine Clearance: 100.9 mL/min (by C-G formula based on SCr of 0.76 mg/dL). Liver Function Tests: Recent Labs  Lab 07/03/19 1506  AST 31  ALT 16  ALKPHOS 94  BILITOT 1.1  PROT 7.9  ALBUMIN 3.9   No results for input(s): LIPASE, AMYLASE in the last 168 hours. No results for input(s): AMMONIA in the last 168 hours. Coagulation Profile: No results for input(s): INR, PROTIME in the last 168 hours. Cardiac Enzymes: No results for input(s): CKTOTAL, CKMB, CKMBINDEX, TROPONINI in the last 168 hours. BNP (last 3 results) No results for input(s): PROBNP in the last 8760 hours. HbA1C: Recent Labs     07/04/19 0540  HGBA1C 4.8   CBG: Recent Labs  Lab 07/05/19 2141 07/05/19 2314 07/06/19 0419 07/06/19 0736 07/06/19 1136  GLUCAP 147* 144* 144* 91 142*   Lipid Profile: No results for input(s): CHOL, HDL, LDLCALC, TRIG, CHOLHDL, LDLDIRECT in the last 72 hours. Thyroid Function Tests: No results for input(s): TSH, T4TOTAL, FREET4, T3FREE, THYROIDAB in the last 72 hours. Anemia Panel: No results for input(s): VITAMINB12, FOLATE, FERRITIN, TIBC, IRON, RETICCTPCT in the last 72 hours. Sepsis Labs: Recent Labs  Lab 07/03/19 1506  LATICACIDVEN 1.0    Recent Results (from the past 240 hour(s))  Blood culture (routine x 2)     Status: None (Preliminary result)   Collection Time: 07/03/19  9:00 AM   Specimen: BLOOD RIGHT HAND  Result Value Ref Range Status   Specimen Description BLOOD RIGHT HAND  Final   Special Requests   Final    BOTTLES DRAWN AEROBIC AND ANAEROBIC Blood Culture results may not be optimal due to an inadequate volume of blood received in culture bottles   Culture   Final    NO GROWTH 3 DAYS Performed at [facility], [place] [address], [place],  [address]    Report Status PENDING  Incomplete  Respiratory Panel by RT PCR (Flu A&B, Covid) - Nasopharyngeal Swab     Status: None   Collection Time: 07/03/19  9:15 AM   Specimen: Nasopharyngeal Swab  Result Value Ref Range Status   SARS Coronavirus 2 by RT PCR NEGATIVE NEGATIVE Final    Comment: (NOTE) SARS-CoV-2 target nucleic acids are NOT DETECTED. The SARS-CoV-2 RNA is generally detectable in upper respiratoy specimens during the acute phase of infection. The lowest concentration of SARS-CoV-2 viral copies this assay can detect is 131 copies/mL. A negative result does not preclude SARS-Cov-2 infection and should not be used as the sole basis for treatment or other patient management decisions. A negative result may occur with  improper specimen collection/handling, submission of specimen other than  nasopharyngeal swab, presence of viral mutation(s) within the areas targeted by this assay, and inadequate number of viral copies (<131 copies/mL). A negative result must be combined with clinical observations, patient history, and epidemiological information. The expected result is Negative. Fact Sheet for Patients:  PinkCheek.be Fact Sheet for Healthcare Providers:  GravelBags.it This test is not yet ap proved or cleared by the [place] and  has been authorized  for detection and/or diagnosis of SARS-CoV-2 by FDA under an Emergency Use Authorization (EUA). This EUA will remain  in effect (meaning this test can be used) for the duration of the COVID-19 declaration under Section 564(b)(1) of the Act, 21 U.S.C. section 360bbb-3(b)(1), unless the authorization is terminated or revoked sooner.    Influenza A by PCR NEGATIVE NEGATIVE Final   Influenza B by PCR NEGATIVE NEGATIVE Final    Comment: (NOTE) The Xpert Xpress SARS-CoV-2/FLU/RSV assay is intended as an aid in  the diagnosis of influenza from Nasopharyngeal swab specimens and  should not be used as a sole basis for treatment. Nasal washings and  aspirates are unacceptable for Xpert Xpress SARS-CoV-2/FLU/RSV  testing. Fact Sheet for Patients: PinkCheek.be Fact Sheet for Healthcare Providers: GravelBags.it This test is not yet approved or cleared by the [place] FDA and  has been authorized for detection and/or diagnosis of SARS-CoV-2 by  FDA under an Emergency Use Authorization (EUA). This EUA will remain  in effect (meaning this test can be used) for the duration of the  Covid-19 declaration under Section 564(b)(1) of the Act, 21  U.S.C. section 360bbb-3(b)(1), unless the authorization is  terminated or revoked. Performed at [facility], [place] [address], [place], [place] [address]   Blood  culture (routine x 2)     Status: None (Preliminary result)   Collection Time: 07/03/19  9:15 AM   Specimen: BLOOD  Result Value Ref Range Status   Specimen Description BLOOD LEFT ANTECUBITAL  Final   Special Requests   Final    BOTTLES DRAWN AEROBIC AND ANAEROBIC Blood Culture results may not be optimal due to an inadequate volume of blood received in culture bottles   Culture   Final    NO GROWTH 3 DAYS Performed at [facility], [place] [address], [place], [place] [address]    Report Status PENDING  Incomplete  MRSA PCR Screening     Status: None   Collection Time: 07/04/19 12:46 AM   Specimen: Nasopharyngeal  Result Value Ref Range Status   MRSA by PCR NEGATIVE NEGATIVE Final    Comment:        The GeneXpert MRSA Assay (FDA approved for NASAL specimens only), is one component of a comprehensive MRSA colonization surveillance program. It is not intended to diagnose MRSA infection nor to guide or monitor treatment for MRSA infections. Performed at [facility], [place] [address] [place], [place] [address]          Radiology Studies: No results found.      Scheduled Meds: . aspirin  81 mg Oral Daily  . budesonide (PULMICORT) nebulizer solution  0.5 mg Nebulization BID  . Chlorhexidine Gluconate Cloth  6 each Topical Daily  . fluticasone  2 spray Each Nare Daily  . furosemide  40 mg Oral BID  . heparin  5,000 Units Subcutaneous Q8H  . insulin aspart  0-20 Units Subcutaneous Q4H  . ipratropium-albuterol  3 mL Nebulization Q6H  . levETIRAcetam  1,000 mg Oral BID  . levofloxacin  750 mg Oral Daily  . loratadine  10 mg Oral Daily  . losartan  50 mg Oral Daily  . metoprolol succinate  25 mg Oral Daily  . pantoprazole  40 mg Oral Q0600  . [START ON 07/12/2019] predniSONE  10 mg Oral Q breakfast  . predniSONE  50 mg Oral Q breakfast  . rosuvastatin  10 mg Oral Daily  . sodium chloride flush  3  mL Intravenous Once   Continuous Infusions: . sodium chloride  10 mL/hr at 07/06/19 3235     LOS: 3 days    Time spent: 40 minutes    Irine Seal, MD [facility]  If 7PM-7AM, please contact night-coverage [email or web address] 07/06/2019, 2:41 PM

## 2019-07-06 NOTE — Progress Notes (Signed)
PT Cancellation Note  Patient Details Name: Diane Gilbert MRN: [id number] DOB: Oct 23, 1965   Cancelled Treatment:    Reason Eval/Treat Not Completed: Fatigue/lethargy limiting ability to participate  Patient quickly falling back asleep once aroused. Good not maintain staying awake even with covers removed and HOB fully elevated. Per RN, pt is sleepy because she stayed up all night. Will return later today to assess, as schedule permits.   Note: RN reports pt did walk to bathroom yesterday and has knee brace in the closet. Pt too lethargic to answer questions re: ortho followup that was to occur 06/27/19.    Arby Barrette, PT Pager [phone] Rexanne Mano 07/06/2019, 10:58 AM

## 2019-07-07 LAB — CBC WITH DIFFERENTIAL/PLATELET
Abs Immature Granulocytes: 0.02 10*3/uL (ref 0.00–0.07)
Basophils Absolute: 0 10*3/uL (ref 0.0–0.1)
Basophils Relative: 0 %
Eosinophils Absolute: 0.1 10*3/uL (ref 0.0–0.5)
Eosinophils Relative: 1 %
HCT: 59 % — ABNORMAL HIGH (ref 36.0–46.0)
Hemoglobin: 17.6 g/dL — ABNORMAL HIGH (ref 12.0–15.0)
Immature Granulocytes: 0 %
Lymphocytes Relative: 29 %
Lymphs Abs: 3.1 10*3/uL (ref 0.7–4.0)
MCH: 27.5 pg (ref 26.0–34.0)
MCHC: 29.8 g/dL — ABNORMAL LOW (ref 30.0–36.0)
MCV: 92.2 fL (ref 80.0–100.0)
Monocytes Absolute: 1.2 10*3/uL — ABNORMAL HIGH (ref 0.1–1.0)
Monocytes Relative: 11 %
Neutro Abs: 6.3 10*3/uL (ref 1.7–7.7)
Neutrophils Relative %: 59 %
Platelets: 262 10*3/uL (ref 150–400)
RBC: 6.4 MIL/uL — ABNORMAL HIGH (ref 3.87–5.11)
RDW: 15.1 % (ref 11.5–15.5)
WBC: 10.7 10*3/uL — ABNORMAL HIGH (ref 4.0–10.5)
nRBC: 0 % (ref 0.0–0.2)

## 2019-07-07 LAB — BASIC METABOLIC PANEL
Anion gap: 15 (ref 5–15)
BUN: 30 mg/dL — ABNORMAL HIGH (ref 6–20)
CO2: 39 mmol/L — ABNORMAL HIGH (ref 22–32)
Calcium: 9.3 mg/dL (ref 8.9–10.3)
Chloride: 82 mmol/L — ABNORMAL LOW (ref 98–111)
Creatinine, Ser: 0.95 mg/dL (ref 0.44–1.00)
GFR calc Af Amer: >60 mL/min (ref >60)
GFR calc non Af Amer: >60 mL/min (ref >60)
Glucose, Bld: 128 mg/dL — ABNORMAL HIGH (ref 70–99)
Potassium: 3.5 mmol/L (ref 3.5–5.1)
Sodium: 136 mmol/L (ref 135–145)

## 2019-07-07 LAB — GLUCOSE, CAPILLARY
Glucose-Capillary: 105 mg/dL — ABNORMAL HIGH (ref 70–99)
Glucose-Capillary: 138 mg/dL — ABNORMAL HIGH (ref 70–99)
Glucose-Capillary: 147 mg/dL — ABNORMAL HIGH (ref 70–99)
Glucose-Capillary: 178 mg/dL — ABNORMAL HIGH (ref 70–99)
Glucose-Capillary: 192 mg/dL — ABNORMAL HIGH (ref 70–99)
Glucose-Capillary: 275 mg/dL — ABNORMAL HIGH (ref 70–99)

## 2019-07-07 LAB — MAGNESIUM: Magnesium: 2.3 mg/dL (ref 1.7–2.4)

## 2019-07-07 MED ORDER — POTASSIUM CHLORIDE CRYS ER 20 MEQ PO TBCR
40.0000 meq | EXTENDED_RELEASE_TABLET | Freq: Once | ORAL | Status: AC
  Administered 2019-07-07: 40 meq via ORAL
  Filled 2019-07-07: qty 2

## 2019-07-07 MED ORDER — METOPROLOL SUCCINATE ER 25 MG PO TB24
25.0000 mg | ORAL_TABLET | Freq: Once | ORAL | Status: AC
  Administered 2019-07-07: 25 mg via ORAL
  Filled 2019-07-07: qty 1

## 2019-07-07 MED ORDER — QUETIAPINE FUMARATE 100 MG PO TABS
100.0000 mg | ORAL_TABLET | Freq: Two times a day (BID) | ORAL | Status: DC
  Administered 2019-07-07 – 2019-07-11 (×9): 100 mg via ORAL
  Filled 2019-07-07 (×9): qty 1

## 2019-07-07 MED ORDER — METOPROLOL SUCCINATE ER 50 MG PO TB24
50.0000 mg | ORAL_TABLET | Freq: Every day | ORAL | Status: DC
  Administered 2019-07-08: 50 mg via ORAL
  Filled 2019-07-07: qty 1

## 2019-07-07 NOTE — Progress Notes (Signed)
Patient has not been able to rest consistently tonight using the Bipap.  The dreamstation Bipap does not have an aerogen ability to run treatments through.  Since her PaCO2 was elevated on her last ABG, I did not give her the last breathing treatment.  She has already been trying to come off the machine.  I asked her to give it about two more hours if possible.  Her sats have been the best I have seen them tonight.  She has had sats around 96 to 97%.  Will continue to monitor.

## 2019-07-07 NOTE — Progress Notes (Signed)
PROGRESS NOTE    Diane Gilbert  OHY:[id number] DOB: September 11, 1965 DOA: 07/02/2019 PCP: Nolene Ebbs, MD   Brief Narrative:  54 yo female smoker presented to ER with chest discomfort and dyspnea.  She has hx of respiratory bronchiolitis with interstitial lung disease (RB ILD) on prednisone 10 mg daily, emphysema and chronic hypoxic respiratory failure on 4 liters oxygen.  She was found to have Lt MCL avulsion, and superimposed comminuted spiral fx of proximal fibula on 06/19/19.  Found to have acute hypoxia and hypercapnia, and CT chest showed changes of pneumonia.  COVID 19 test negative.  Started on Bipap, ABx, steroids.  PCCM asked to admit to ICU.  Assessment & Plan:   Principal Problem:   Acute on chronic respiratory failure with hypoxia and hypercapnia (HCC) Active Problems:   Acute on chronic diastolic heart failure (HCC)   Schizophrenia (HCC)   Chronic respiratory failure with hypoxia ([place])   Community acquired pneumonia   Acute respiratory failure with hypoxia (HCC)   Acute respiratory failure with hypoxia and hypercapnia (HCC)   History of CVA (cerebrovascular accident)   Seizure disorder ([place])   Acute metabolic encephalopathy  #1 acute on chronic hypoxemic hypercapnic respiratory failure likely secondary to multilobar pneumonia and acute on chronic diastolic heart failure Patient with a history of respiratory bronchiolitis with interstitial lung disease secondary to smoking per pulmonary.  Patient also with morbid obesity with obesity hypoventilation syndrome and hypercarbic failure.  Patient noted to have had a negative outpatient sleep study.  Patient with some clinical improvement with sats of 96% on 6 L nasal cannula.  Patient also per PT with sats dropping into the 80s on ambulation.  Patient on 4 L home O2.  Urine strep pneumococcus antigen was positive.  Chest x-ray worrisome for pneumonia versus bilateral CHF.  CT angiogram chest negative for PE however this show  right middle and right lower lobe infiltrate and probable left lower lobe infiltrate.  Patient with a urine output of 4 L over the past 24 hours.  Patient is - 16.455 L during this hospitalization.  Patient was on IV antibiotics and has subsequently been transitioned to oral Levaquin.  Continue Pulmicort, Flonase, Claritin, PPI, scheduled duo nebs, steroid taper as recommended by pulmonary.  Per pulmonary patient will benefit from NIPPV/BiPAP therapy at discharge which pulmonary is arranging.  Continue supportive care.  Follow.  2.  Multilobar pneumonia Noted on CT chest.  Patient presented with worsening shortness of breath and increased O2 requirements with acute on chronic hypoxemic and hypercarbic respiratory failure.  Urine strep pneumococcus antigen was positive.  Urine Legionella antigen negative.  Patient currently afebrile.  Patient was on IV antibiotics and has subsequently been transitioned to oral Levaquin to complete course of antibiotic treatment.  See #1.  3.  Acute on chronic diastolic heart failure Patient presented with increased O2 requirements from baseline, chest x-ray concerning for volume overload plus or minus pneumonia.  Patient was placed on IV Lasix and received a dose of metolazone.  Patient currently on oral Lasix.  Patient with a urine output of 4 L over the past 24 hours.  Patient is - 16.455 L during this hospitalization.  Current weight is 110 kg(07/05/19) from 108.2 kg on admission.  Weights have not been recorded over the past 2 days.  Continue oral Lasix, Toprol, Cozaar.  Outpatient follow-up.  Will need outpatient follow-up.  4.  CVA with seizure disorder Stable.  Continue Keppra and Crestor.  Continue aspirin.  PT/OT.  5.  Acute metabolic encephalopathy Patient more alert this morning and following commands appropriately.  States did not sleep too well and was asking for trazodone which was ordered as needed but not given last night.  Continue to hold psychiatric  medications and resume Seroquel.  Continue treatment as in #1 2 and 3.  Supportive care.  Follow.  6.  History of schizophrenia Patient more alert this morning.  Following commands.  Continue to hold Wellbutrin, Prozac, Neurontin, Abilify.  Trazodone was ordered as needed however not given last night and as such we will make scheduled.  Resume home dose Seroquel.  7.  Left MCL avulsion, superimposed community spiral fracture of the proximal tibial 06/19/2019 Lower extremity Dopplers negative for DVT.  Outpatient follow-up with orthopedics.   DVT prophylaxis: Heparin Code Status: Full Family Communication: Updated patient.  No family at bedside. Disposition Plan: To be determined   Consultants:   None  Procedures:   CT angiogram chest 07/03/2019  Chest x-ray 07/02/2019, 07/04/2019  Lower extremity Dopplers 07/03/2019  Antimicrobials: Rocephin 1/04 Zithromax 1/04 x1 Vancomycin 1/04 >>>> 07/04/2019 Cefepime 1/04 >>>> 07/04/2019 Ceftriaxone 1/04 > 1/06  Oral Levaquin 07/06/2019   Micro Data:  SARS CoV2 PCR 1/04 >> negative Influenza PCR 1/04 >> A and B negative Pneumococcal Ag 1/04 >> positive Legionella Ag 1/04 >> negative  Blood 1/04 >> NGTD   Subjective: Patient more alert today.  Sitting on the side of the bed.  Patient stating did not sleep too well last night and was not able to tolerate BiPAP as well.  Denies any chest pain.  Feels some improvement with shortness of breath.   Objective: Vitals:   07/07/19 0455 07/07/19 0625 07/07/19 0700 07/07/19 0822  BP:    109/63  Pulse:   74 65  Resp:   18 12  Temp: 98.3 F (36.8 C) 98.5 F (36.9 C)  97.6 F (36.4 C)  TempSrc:    Oral  SpO2:   98% (!) 84%  Weight:      Height:        Intake/Output Summary (Last 24 hours) at 07/07/2019 1025 Last data filed at 07/07/2019 0853 Gross per 24 hour  Intake 720 ml  Output 4600 ml  Net -3880 ml   Filed Weights   07/04/19 0049 07/04/19 0445 07/05/19 0500  Weight: 108.2 kg 108.2 kg  110 kg    Examination:  General exam: NAD Respiratory system: Diffuse crackles.  No wheezing.  Normal respiratory effort.  Speaking in full sentences.  Cardiovascular system: Regular rate rhythm no murmurs rubs or gallops.  No JVD.  No lower extremity edema.  Gastrointestinal system: Abdomen is soft, nontender, nondistended, positive bowel sounds.  No rebound.  No guarding. Central nervous system: Alert and oriented. No focal neurological deficits. Extremities: Symmetric 5 x 5 power. Skin: No rashes, lesions or ulcers Psychiatry: Judgement and insight appear normal. Mood & affect appropriate.     Data Reviewed: I have personally reviewed following labs and imaging studies  CBC: Recent Labs  Lab 07/03/19 1506 07/04/19 0540 07/05/19 0120 07/05/19 1030 07/06/19 0440 07/07/19 0209  WBC 10.7* 8.1 9.5  --  11.9* 10.7*  NEUTROABS 7.6  --   --   --   --  6.3  HGB 13.8 14.1 14.8 18.4* 17.2* 17.6*  HCT 51.7* 51.1* 50.8* 54.0* 57.8* 59.0*  MCV 104.2* 100.0 95.3  --  92.2 92.2  PLT 216 216 246  --  258 287   Basic Metabolic Panel: Recent  Labs  Lab 07/03/19 1506 07/04/19 0540 07/05/19 0120 07/05/19 1030 07/06/19 0440 07/07/19 0209  NA 143 145 136 136 138 136  K 4.4 4.5 3.7 3.5 3.5 3.5  CL 92* 92* 82*  --  81* 82*  CO2 42* 40* 41*  --  44* 39*  GLUCOSE 136* 108* 180*  --  117* 128*  BUN _0 --  17 30*  CREATININE 0.87 0.63 0.74  --  0.76 0.95  CALCIUM 8.8* 8.8* 8.9  --  9.3 9.3  MG 1.8  --  2.1  --  2.1 2.3  PHOS 4.6  --  1.9*  --  3.0  --    GFR: Estimated Creatinine Clearance: 84.9 mL/min (by C-G formula based on SCr of 0.95 mg/dL). Liver Function Tests: Recent Labs  Lab 07/03/19 1506  AST 31  ALT 16  ALKPHOS 94  BILITOT 1.1  PROT 7.9  ALBUMIN 3.9   No results for input(s): LIPASE, AMYLASE in the last 168 hours. No results for input(s): AMMONIA in the last 168 hours. Coagulation Profile: No results for input(s): INR, PROTIME in the last 168  hours. Cardiac Enzymes: No results for input(s): CKTOTAL, CKMB, CKMBINDEX, TROPONINI in the last 168 hours. BNP (last 3 results) No results for input(s): PROBNP in the last 8760 hours. HbA1C: No results for input(s): HGBA1C in the last 72 hours. CBG: Recent Labs  Lab 07/06/19 1556 07/06/19 2005 07/07/19 0000 07/07/19 0354 07/07/19 0848  GLUCAP 311* 150* 178* 147* 105*   Lipid Profile: No results for input(s): CHOL, HDL, LDLCALC, TRIG, CHOLHDL, LDLDIRECT in the last 72 hours. Thyroid Function Tests: No results for input(s): TSH, T4TOTAL, FREET4, T3FREE, THYROIDAB in the last 72 hours. Anemia Panel: No results for input(s): VITAMINB12, FOLATE, FERRITIN, TIBC, IRON, RETICCTPCT in the last 72 hours. Sepsis Labs: Recent Labs  Lab 07/03/19 1506  LATICACIDVEN 1.0    Recent Results (from the past 240 hour(s))  Blood culture (routine x 2)     Status: None (Preliminary result)   Collection Time: 07/03/19  9:00 AM   Specimen: BLOOD RIGHT HAND  Result Value Ref Range Status   Specimen Description BLOOD RIGHT HAND  Final   Special Requests   Final    BOTTLES DRAWN AEROBIC AND ANAEROBIC Blood Culture results may not be optimal due to an inadequate volume of blood received in culture bottles   Culture   Final    NO GROWTH 4 DAYS Performed at [facility], [place] [address], [place], [place] [address]    Report Status PENDING  Incomplete  Respiratory Panel by RT PCR (Flu A&B, Covid) - Nasopharyngeal Swab     Status: None   Collection Time: 07/03/19  9:15 AM   Specimen: Nasopharyngeal Swab  Result Value Ref Range Status   SARS Coronavirus 2 by RT PCR NEGATIVE NEGATIVE Final    Comment: (NOTE) SARS-CoV-2 target nucleic acids are NOT DETECTED. The SARS-CoV-2 RNA is generally detectable in upper respiratoy specimens during the acute phase of infection. The lowest concentration of SARS-CoV-2 viral copies this assay can detect is 131 copies/mL. A negative result does not preclude  SARS-Cov-2 infection and should not be used as the sole basis for treatment or other patient management decisions. A negative result may occur with  improper specimen collection/handling, submission of specimen other than nasopharyngeal swab, presence of viral mutation(s) within the areas targeted by this assay, and inadequate number of viral copies (<131 copies/mL). A negative result must be  combined with clinical observations, patient history, and epidemiological information. The expected result is Negative. Fact Sheet for Patients:  PinkCheek.be Fact Sheet for Healthcare Providers:  GravelBags.it This test is not yet ap proved or cleared by the [place] FDA and  has been authorized for detection and/or diagnosis of SARS-CoV-2 by FDA under an Emergency Use Authorization (EUA). This EUA will remain  in effect (meaning this test can be used) for the duration of the COVID-19 declaration under Section 564(b)(1) of the Act, 21 U.S.C. section 360bbb-3(b)(1), unless the authorization is terminated or revoked sooner.    Influenza A by PCR NEGATIVE NEGATIVE Final   Influenza B by PCR NEGATIVE NEGATIVE Final    Comment: (NOTE) The Xpert Xpress SARS-CoV-2/FLU/RSV assay is intended as an aid in  the diagnosis of influenza from Nasopharyngeal swab specimens and  should not be used as a sole basis for treatment. Nasal washings and  aspirates are unacceptable for Xpert Xpress SARS-CoV-2/FLU/RSV  testing. Fact Sheet for Patients: PinkCheek.be Fact Sheet for Healthcare Providers: GravelBags.it This test is not yet approved or cleared by the [place] FDA and  has been authorized for detection and/or diagnosis of SARS-CoV-2 by  FDA under an Emergency Use Authorization (EUA). This EUA will remain  in effect (meaning this test can be used) for the duration of the  Covid-19  declaration under Section 564(b)(1) of the Act, 21  U.S.C. section 360bbb-3(b)(1), unless the authorization is  terminated or revoked. Performed at [facility], [place] [address], [place], [place] [address]   Blood culture (routine x 2)     Status: None (Preliminary result)   Collection Time: 07/03/19  9:15 AM   Specimen: BLOOD  Result Value Ref Range Status   Specimen Description BLOOD LEFT ANTECUBITAL  Final   Special Requests   Final    BOTTLES DRAWN AEROBIC AND ANAEROBIC Blood Culture results may not be optimal due to an inadequate volume of blood received in culture bottles   Culture   Final    NO GROWTH 4 DAYS Performed at [facility], [place] [address], [place], [place] [address]    Report Status PENDING  Incomplete  MRSA PCR Screening     Status: None   Collection Time: 07/04/19 12:46 AM   Specimen: Nasopharyngeal  Result Value Ref Range Status   MRSA by PCR NEGATIVE NEGATIVE Final    Comment:        The GeneXpert MRSA Assay (FDA approved for NASAL specimens only), is one component of a comprehensive MRSA colonization surveillance program. It is not intended to diagnose MRSA infection nor to guide or monitor treatment for MRSA infections. Performed at [facility], [place] [address] [place], [place] [address]          Radiology Studies: No results found.      Scheduled Meds: . aspirin  81 mg Oral Daily  . budesonide (PULMICORT) nebulizer solution  0.5 mg Nebulization BID  . Chlorhexidine Gluconate Cloth  6 each Topical Daily  . fluticasone  2 spray Each Nare Daily  . furosemide  40 mg Oral BID  . heparin  5,000 Units Subcutaneous Q8H  . insulin aspart  0-20 Units Subcutaneous Q4H  . ipratropium-albuterol  3 mL Nebulization Q6H  . levETIRAcetam  1,000 mg Oral BID  . levofloxacin  750 mg Oral Daily  . loratadine  10 mg Oral Daily  . losartan  50 mg Oral Daily  . metoprolol succinate  25 mg  Oral Daily  . pantoprazole  40 mg Oral Q0600  .  [START ON 07/12/2019] predniSONE  10 mg Oral Q breakfast  . predniSONE  50 mg Oral Q breakfast  . rosuvastatin  10 mg Oral Daily  . sodium chloride flush  3 mL Intravenous Once   Continuous Infusions: . sodium chloride 10 mL/hr at 07/06/19 0608     LOS: 4 days    Time spent: 40 minutes    Irine Seal, MD [facility]  If 7PM-7AM, please contact night-coverage [email or web address] 07/07/2019, 10:25 AM

## 2019-07-07 NOTE — Evaluation (Signed)
Physical Therapy Evaluation Patient Details Name: Diane Gilbert MRN: [id number] DOB: 1965-09-08 Today's Date: 07/07/2019   History of Present Illness  54 yo female smoker presented to ER with chest discomfort and dyspnea.  She has hx of respiratory bronchiolitis with interstitial lung disease, emphysema and chronic hypoxic respiratory failure on 4 liters oxygen. Additional PMHx includes CVA, seizure, CHF EF 60%, schizophrenia, HTN, back surgery, and Lt MCL avulsion with comminuted spiral fx of proximal fibula on 06/19/19.  Found to have acute hypoxia and hypercapnia, and CT chest showed changes of pneumonia.  COVID 19 test negative.   Clinical Impression   Patient received in bed, pleasant and willing to work with therapy today. Able to complete all functional mobility with S-min guard but once in standing went into A-fib in RVR with beats ranging from 97 to as high as 167BPM, MD present and aware of HR response, also of intermittent SPO2 desat into 80s even on 6LPM O2. She really moves quite well, session limited by HR/SpO2 response this morning. Feel that once medical issues are resolved she will like be able to return home with HHPT and S moving forward. She was left in bed with chair position and alarm active, RN aware of patient status and all needs otherwise met.     Follow Up Recommendations Home health PT;Supervision for mobility/OOB    Equipment Recommendations  Rolling walker with 5" wheels;3in1 (PT)    Recommendations for Other Services       Precautions / Restrictions Precautions Precautions: Fall Precaution Comments: has L MCL avulsion/fibular spiral fracture L LE- needs to be in knee brace; watch HR and SpO2 Required Braces or Orthoses: Other Brace Other Brace: knee brace L LE Restrictions Weight Bearing Restrictions: No Other Position/Activity Restrictions: no known WB precautions- from chart, sounds like MCL injury/fibula spiral fracture were diagnosed 12/21 and they  sent her home with knee brace/told her OK to walk on it with brace on      Mobility  Bed Mobility Overal bed mobility: Needs Assistance Bed Mobility: Supine to Sit;Sit to Supine     Supine to sit: Supervision Sit to supine: Supervision   General bed mobility comments: S for safety, line management  Transfers Overall transfer level: Needs assistance Equipment used: Rolling walker (2 wheeled) Transfers: Sit to/from Stand Sit to Stand: Min guard         General transfer comment: min guard and cues for hand placement and sequencing; once standing HR in A-fib to 100-155BPM, MD in room and aware  Ambulation/Gait             General Gait Details: deferred gait due to elevated HR; did take 2 side steps up EOB and HR elevation to 150-167BPM, reduced to 97-135BPM in A-fib with return to sitting, MD also aware  Stairs            Wheelchair Mobility    Modified Rankin (Stroke Patients Only)       Balance Overall balance assessment: Mild deficits observed, not formally tested;History of Falls                                           Pertinent Vitals/Pain Pain Assessment: No/denies pain    Home Living Family/patient expects to be discharged to:: Private residence Living Arrangements: Alone Available Help at Discharge: Friend(s);Available PRN/intermittently Type of Home: Apartment Home Access: Stairs to enter  Entrance Stairs-Rails: Can reach both Entrance Stairs-Number of Steps: 1 Home Layout: One level Home Equipment: Walker - 2 wheels;Bedside commode;Cane - quad      Prior Function Level of Independence: Needs assistance   Gait / Transfers Assistance Needed: pt reports using RW PTA due to falls  ADL's / Homemaking Assistance Needed: had aide that quit, friends now helping her as able  Comments: uses 4L O2 at home     Hand Dominance   Dominant Hand: Right    Extremity/Trunk Assessment   Upper Extremity Assessment Upper  Extremity Assessment: Defer to OT evaluation    Lower Extremity Assessment Lower Extremity Assessment: Generalized weakness    Cervical / Trunk Assessment Cervical / Trunk Assessment: Normal  Communication   Communication: No difficulties  Cognition Arousal/Alertness: Awake/alert Behavior During Therapy: WFL for tasks assessed/performed Overall Cognitive Status: Within Functional Limits for tasks assessed                                 General Comments: pleasant and A&Ox4, able to recall recent history of ED visit 12/21 and aware of everything she needs to safely get out of bed (directed PT that she needs socks, brace, etc)      General Comments General comments (skin integrity, edema, etc.): HR in A-fib 97 to as much as 167BPM during session, SpO2 84-95% on 6LPM O2    Exercises     Assessment/Plan    PT Assessment Patient needs continued PT services  PT Problem List Decreased strength;Decreased knowledge of use of DME;Obesity;Decreased activity tolerance;Decreased safety awareness;Decreased balance;Decreased knowledge of precautions;Decreased mobility;Decreased coordination       PT Treatment Interventions DME instruction;Balance training;Gait training;Neuromuscular re-education;Stair training;Functional mobility training;Therapeutic activities;Therapeutic exercise    PT Goals (Current goals can be found in the Care Plan section)  Acute Rehab PT Goals Patient Stated Goal: be able to walk PT Goal Formulation: With patient Time For Goal Achievement: 07/21/19 Potential to Achieve Goals: Good    Frequency Min 3X/week   Barriers to discharge Decreased caregiver support used to have PCA, that aide quit and now she depends on friends for assistance    Co-evaluation               AM-PAC PT "6 Clicks" Mobility  Outcome Measure Help needed turning from your back to your side while in a flat bed without using bedrails?: None Help needed moving from lying  on your back to sitting on the side of a flat bed without using bedrails?: None Help needed moving to and from a bed to a chair (including a wheelchair)?: A Little Help needed standing up from a chair using your arms (e.g., wheelchair or bedside chair)?: A Little Help needed to walk in hospital room?: A Little Help needed climbing 3-5 steps with a railing? : A Lot 6 Click Score: 19    End of Session Equipment Utilized During Treatment: Gait belt;Oxygen Activity Tolerance: Treatment limited secondary to medical complications (Comment);Other (comment)(HR/SPO2) Patient left: in bed;with call bell/phone within reach;with bed alarm set;with family/visitor present Nurse Communication: Mobility status;Other (comment)(HR with mobility) PT Visit Diagnosis: Difficulty in walking, not elsewhere classified (R26.2);Muscle weakness (generalized) (M62.81);History of falling (Z91.81)    Time: 4010-2725 PT Time Calculation (min) (ACUTE ONLY): 55 min   Charges:   PT Evaluation $PT Eval Moderate Complexity: 1 Mod (medicaid eval)           Adara Kittle U PT, DPT,  [place]    Pager [phone] Acute Rehab Office [phone]

## 2019-07-08 DIAGNOSIS — N179 Acute kidney failure, unspecified: Secondary | ICD-10-CM | POA: Diagnosis not present

## 2019-07-08 DIAGNOSIS — E876 Hypokalemia: Secondary | ICD-10-CM

## 2019-07-08 DIAGNOSIS — F203 Undifferentiated schizophrenia: Secondary | ICD-10-CM

## 2019-07-08 LAB — CULTURE, BLOOD (ROUTINE X 2)
Culture: NO GROWTH
Culture: NO GROWTH

## 2019-07-08 LAB — CBC
HCT: 56.1 % — ABNORMAL HIGH (ref 36.0–46.0)
Hemoglobin: 16.8 g/dL — ABNORMAL HIGH (ref 12.0–15.0)
MCH: 27.4 pg (ref 26.0–34.0)
MCHC: 29.9 g/dL — ABNORMAL LOW (ref 30.0–36.0)
MCV: 91.4 fL (ref 80.0–100.0)
Platelets: 215 10*3/uL (ref 150–400)
RBC: 6.14 MIL/uL — ABNORMAL HIGH (ref 3.87–5.11)
RDW: 14.6 % (ref 11.5–15.5)
WBC: 9.5 10*3/uL (ref 4.0–10.5)
nRBC: 0 % (ref 0.0–0.2)

## 2019-07-08 LAB — GLUCOSE, CAPILLARY
Glucose-Capillary: 119 mg/dL — ABNORMAL HIGH (ref 70–99)
Glucose-Capillary: 129 mg/dL — ABNORMAL HIGH (ref 70–99)
Glucose-Capillary: 130 mg/dL — ABNORMAL HIGH (ref 70–99)
Glucose-Capillary: 144 mg/dL — ABNORMAL HIGH (ref 70–99)
Glucose-Capillary: 202 mg/dL — ABNORMAL HIGH (ref 70–99)
Glucose-Capillary: 207 mg/dL — ABNORMAL HIGH (ref 70–99)
Glucose-Capillary: 244 mg/dL — ABNORMAL HIGH (ref 70–99)

## 2019-07-08 LAB — BASIC METABOLIC PANEL
Anion gap: 12 (ref 5–15)
BUN: 47 mg/dL — ABNORMAL HIGH (ref 6–20)
CO2: 38 mmol/L — ABNORMAL HIGH (ref 22–32)
Calcium: 8.5 mg/dL — ABNORMAL LOW (ref 8.9–10.3)
Chloride: 80 mmol/L — ABNORMAL LOW (ref 98–111)
Creatinine, Ser: 1.98 mg/dL — ABNORMAL HIGH (ref 0.44–1.00)
GFR calc Af Amer: 33 mL/min — ABNORMAL LOW (ref >60)
GFR calc non Af Amer: 29 mL/min — ABNORMAL LOW (ref >60)
Glucose, Bld: 184 mg/dL — ABNORMAL HIGH (ref 70–99)
Potassium: 3.1 mmol/L — ABNORMAL LOW (ref 3.5–5.1)
Sodium: 130 mmol/L — ABNORMAL LOW (ref 135–145)

## 2019-07-08 MED ORDER — SODIUM CHLORIDE 0.9 % IV BOLUS
250.0000 mL | Freq: Once | INTRAVENOUS | Status: AC
  Administered 2019-07-08: 250 mL via INTRAVENOUS

## 2019-07-08 MED ORDER — SODIUM CHLORIDE 0.9 % IV SOLN: INTRAVENOUS | Status: DC

## 2019-07-08 MED ORDER — POTASSIUM CHLORIDE CRYS ER 20 MEQ PO TBCR
40.0000 meq | EXTENDED_RELEASE_TABLET | Freq: Once | ORAL | Status: AC
  Administered 2019-07-08: 40 meq via ORAL
  Filled 2019-07-08: qty 2

## 2019-07-08 MED ORDER — LEVOFLOXACIN 750 MG PO TABS
750.0000 mg | ORAL_TABLET | ORAL | Status: DC
  Administered 2019-07-10: 750 mg via ORAL
  Filled 2019-07-08: qty 1

## 2019-07-08 NOTE — Care Management (Signed)
Patient continues to exhibit signs of hypercapnia associated with chronic respiratory failure secondary to severe restrictive thoracic disorder due to interstitial lund disease/ obesity hypoventilation syndrome. Patient requires the use of NIV both at sleep and in the daytime to help with exacerbation periods. The use of the NIV will treat both the patients high PCO2 levels and can reduce the risk of exacerbations and future hospitalizations when used at night and during the day. The patient will need these advanced settings in conjunction with their current medication regimen; BIPAP is not an option due to its functional limitations and the severity of the patient's condition. Failure to have NIV available for use over a 24 hour period could lead to death.  Patient is able to clear airway and manage secretions.

## 2019-07-08 NOTE — Progress Notes (Signed)
PROGRESS NOTE    Diane Gilbert  TKW:[id number] DOB: 03-Dec-1965 DOA: 07/02/2019 PCP: Nolene Ebbs, MD   Brief Narrative:  54 yo female smoker presented to ER with chest discomfort and dyspnea.  She has hx of respiratory bronchiolitis with interstitial lung disease (RB ILD) on prednisone 10 mg daily, emphysema and chronic hypoxic respiratory failure on 4 liters oxygen.  She was found to have Lt MCL avulsion, and superimposed comminuted spiral fx of proximal fibula on 06/19/19.  Found to have acute hypoxia and hypercapnia, and CT chest showed changes of pneumonia.  COVID 19 test negative.  Started on Bipap, ABx, steroids.  PCCM asked to admit to ICU.  Assessment & Plan:   Principal Problem:   Acute on chronic respiratory failure with hypoxia and hypercapnia (HCC) Active Problems:   Acute on chronic diastolic heart failure (HCC)   Hypokalemia   Schizophrenia (HCC)   Chronic respiratory failure with hypoxia ([place])   Community acquired pneumonia   Acute respiratory failure with hypoxia (HCC)   Acute respiratory failure with hypoxia and hypercapnia (HCC)   History of CVA (cerebrovascular accident)   Seizure disorder ([place])   Acute metabolic encephalopathy   ARF (acute renal failure) (HCC)  1 acute on chronic hypoxemic hypercapnic respiratory failure likely secondary to multilobar pneumonia and acute on chronic diastolic heart failure Patient with a history of respiratory bronchiolitis with interstitial lung disease secondary to smoking per pulmonary.  Patient also with morbid obesity with obesity hypoventilation syndrome and hypercarbic failure.  Patient noted to have had a negative outpatient sleep study.  Patient with some clinical improvement with sats of 96% on 6 L nasal cannula.  Patient also per PT with sats dropping into the 80s on ambulation.  Patient on 4 L home O2.  Urine strep pneumococcus antigen was positive.  Chest x-ray worrisome for pneumonia versus bilateral CHF.  CT  angiogram chest negative for PE however this show right middle and right lower lobe infiltrate and probable left lower lobe infiltrate.  Patient with a urine output of 4 L over the past 24 hours.  Patient is - 17.091 L during this hospitalization.  Patient was on IV antibiotics and has subsequently been transitioned to oral Levaquin.  Continue Pulmicort, Flonase, Claritin, PPI, scheduled duo nebs, steroid taper as recommended by pulmonary.  Per pulmonary patient will benefit from NIPPV/BiPAP therapy at discharge which pulmonary is arranging.  Continue supportive care.  Follow.  2.  Multilobar pneumonia Noted on CT chest.  Patient presented with worsening shortness of breath and increased O2 requirements with acute on chronic hypoxemic and hypercarbic respiratory failure.  Urine strep pneumococcus antigen was positive.  Urine Legionella antigen negative.  Patient currently afebrile.  Patient was on IV antibiotics and has subsequently been transitioned to oral Levaquin to complete course of antibiotic treatment.  See #1.  3.  Acute on chronic diastolic heart failure Patient presented with increased O2 requirements from baseline, chest x-ray concerning for volume overload plus or minus pneumonia.  Patient was placed on IV Lasix and received a dose of metolazone.  Patient currently on oral Lasix.  Patient with a urine output of 1.476 L over the past 24 hours.  Patient is - 17.091 L during this hospitalization.  Current weight is 108.4 kg from 110 kg(07/05/19) from 108.2 kg on admission.  Patient with a bump in creatinine and as such we will discontinue Lasix and Cozaar.  Patient already received a dose this morning.  Toprol has been given.  Outpatient  follow-up.    4.  CVA with seizure disorder Continue Keppra and Crestor.  Continue aspirin.  PT/OT.   5.  Acute metabolic encephalopathy Patient more alert this morning and following commands appropriately.  Patient states slept well after receiving Seroquel and  trazodone last night. Continue to hold psychiatric medications.  Continue treatment as in #1 2 and 3.  Supportive care.  Follow.  6.  History of schizophrenia Patient more alert this morning.  Following commands.  Continue to hold Wellbutrin, Prozac, Neurontin, Abilify.  Continue seroquel and trazadone.  7.  Left MCL avulsion, superimposed community spiral fracture of the proximal tibial 06/19/2019 Lower extremity Dopplers negative for DVT.  Outpatient follow-up with orthopedics.  8.  Hypokalemia Secondary to diuresis.  K. Dur 40 mEq p.o. x1.  9.  Acute renal failure Likely secondary to a prerenal azotemia secondary to overdiuresis.  Patient noted to have systolic blood pressures in the 80s.  Patient already received antihypertensive medications.  Discontinue Lasix and ARB for now.  250 cc normal saline bolus x1.  Will place on gentle hydration for the next 24 hours.  Follow.  10.  Hypotension Likely secondary to overdiuresis.  Patient received all antihypertensive medications this morning.  We will hold Lasix, Cozaar secondary to acute renal failure.  Normal saline 250 cc bolus.  Gentle hydration normal saline 75 cc/h for the next 24 hours.   DVT prophylaxis: Heparin Code Status: Full Family Communication: Updated patient.  No family at bedside. Disposition Plan: To be determined   Consultants:   None  Procedures:   CT angiogram chest 07/03/2019  Chest x-ray 07/02/2019, 07/04/2019  Lower extremity Dopplers 07/03/2019  Antimicrobials: Rocephin 1/04 Zithromax 1/04 x1 Vancomycin 1/04 >>>> 07/04/2019 Cefepime 1/04 >>>> 07/04/2019 Ceftriaxone 1/04 > 1/06  Oral Levaquin 07/06/2019   Micro Data:  SARS CoV2 PCR 1/04 >> negative Influenza PCR 1/04 >> A and B negative Pneumococcal Ag 1/04 >> positive Legionella Ag 1/04 >> negative  Blood 1/04 >> NGTD   Subjective: Patient alert.  Getting a breathing treatment.  States was able to get some sleep last night after her Seroquel and  trazodone was given.  Tolerated BiPAP overnight.  Denies chest pain.   Objective: Vitals:   07/08/19 1008 07/08/19 1037 07/08/19 1109 07/08/19 1254  BP: 96/61 104/62 (!) 92/56 (!) 80/49  Pulse: 70 72 61 (!) 59  Resp: _0 Temp:      TempSrc:      SpO2: 90% (!) 89% 90% 94%  Weight:      Height:        Intake/Output Summary (Last 24 hours) at 07/08/2019 1450 Last data filed at 07/08/2019 0200 Gross per 24 hour  Intake 240 ml  Output 876 ml  Net -636 ml   Filed Weights   07/04/19 0445 07/05/19 0500 07/08/19 0401  Weight: 108.2 kg 110 kg 108.4 kg    Examination:  General exam: NAD Respiratory system: Diffuse crackles.  No wheezing.  Normal respiratory effort.  Speaking in full sentences.  Cardiovascular system: RRR no murmurs rubs or gallops.  No JVD.  No lower extremity edema.  Gastrointestinal system: Abdomen is nontender, nondistended, soft, positive bowel sounds.  No rebound.  No guarding.  Central nervous system: Alert and oriented. No focal neurological deficits. Extremities: Symmetric 5 x 5 power. Skin: No rashes, lesions or ulcers Psychiatry: Judgement and insight appear normal. Mood & affect appropriate.     Data Reviewed: I have personally reviewed following labs and  imaging studies  CBC: Recent Labs  Lab 07/03/19 1506 07/04/19 0540 07/05/19 0120 07/05/19 1030 07/06/19 0440 07/07/19 0209 07/08/19 0853  WBC 10.7* 8.1 9.5  --  11.9* 10.7* 9.5  NEUTROABS 7.6  --   --   --   --  6.3  --   HGB 13.8 14.1 14.8 18.4* 17.2* 17.6* 16.8*  HCT 51.7* 51.1* 50.8* 54.0* 57.8* 59.0* 56.1*  MCV 104.2* 100.0 95.3  --  92.2 92.2 91.4  PLT 216 216 246  --  258 262 366   Basic Metabolic Panel: Recent Labs  Lab 07/03/19 1506 07/04/19 0540 07/05/19 0120 07/05/19 1030 07/06/19 0440 07/07/19 0209 07/08/19 0853  NA 143 145 136 136 138 136 130*  K 4.4 4.5 3.7 3.5 3.5 3.5 3.1*  CL 92* 92* 82*  --  81* 82* 80*  CO2 42* 40* 41*  --  44* 39* 38*  GLUCOSE 136* 108*  180*  --  117* 128* 184*  BUN _0 --  17 30* 47*  CREATININE 0.87 0.63 0.74  --  0.76 0.95 1.98*  CALCIUM 8.8* 8.8* 8.9  --  9.3 9.3 8.5*  MG 1.8  --  2.1  --  2.1 2.3  --   PHOS 4.6  --  1.9*  --  3.0  --   --    GFR: Estimated Creatinine Clearance: 40.4 mL/min (A) (by C-G formula based on SCr of 1.98 mg/dL (H)). Liver Function Tests: Recent Labs  Lab 07/03/19 1506  AST 31  ALT 16  ALKPHOS 94  BILITOT 1.1  PROT 7.9  ALBUMIN 3.9   No results for input(s): LIPASE, AMYLASE in the last 168 hours. No results for input(s): AMMONIA in the last 168 hours. Coagulation Profile: No results for input(s): INR, PROTIME in the last 168 hours. Cardiac Enzymes: No results for input(s): CKTOTAL, CKMB, CKMBINDEX, TROPONINI in the last 168 hours. BNP (last 3 results) No results for input(s): PROBNP in the last 8760 hours. HbA1C: No results for input(s): HGBA1C in the last 72 hours. CBG: Recent Labs  Lab 07/07/19 1944 07/08/19 0032 07/08/19 0348 07/08/19 0747 07/08/19 1232  GLUCAP 138* 129* 119* 207* 144*   Lipid Profile: No results for input(s): CHOL, HDL, LDLCALC, TRIG, CHOLHDL, LDLDIRECT in the last 72 hours. Thyroid Function Tests: No results for input(s): TSH, T4TOTAL, FREET4, T3FREE, THYROIDAB in the last 72 hours. Anemia Panel: No results for input(s): VITAMINB12, FOLATE, FERRITIN, TIBC, IRON, RETICCTPCT in the last 72 hours. Sepsis Labs: Recent Labs  Lab 07/03/19 1506  LATICACIDVEN 1.0    Recent Results (from the past 240 hour(s))  Blood culture (routine x 2)     Status: None   Collection Time: 07/03/19  9:00 AM   Specimen: BLOOD RIGHT HAND  Result Value Ref Range Status   Specimen Description BLOOD RIGHT HAND  Final   Special Requests   Final    BOTTLES DRAWN AEROBIC AND ANAEROBIC Blood Culture results may not be optimal due to an inadequate volume of blood received in culture bottles   Culture   Final    NO GROWTH 5 DAYS Performed at [facility],  [place] [address], [place], [place] [address]    Report Status 07/08/2019 FINAL  Final  Respiratory Panel by RT PCR (Flu A&B, Covid) - Nasopharyngeal Swab     Status: None   Collection Time: 07/03/19  9:15 AM   Specimen: Nasopharyngeal Swab  Result Value Ref Range Status   SARS  Coronavirus 2 by RT PCR NEGATIVE NEGATIVE Final    Comment: (NOTE) SARS-CoV-2 target nucleic acids are NOT DETECTED. The SARS-CoV-2 RNA is generally detectable in upper respiratoy specimens during the acute phase of infection. The lowest concentration of SARS-CoV-2 viral copies this assay can detect is 131 copies/mL. A negative result does not preclude SARS-Cov-2 infection and should not be used as the sole basis for treatment or other patient management decisions. A negative result may occur with  improper specimen collection/handling, submission of specimen other than nasopharyngeal swab, presence of viral mutation(s) within the areas targeted by this assay, and inadequate number of viral copies (<131 copies/mL). A negative result must be combined with clinical observations, patient history, and epidemiological information. The expected result is Negative. Fact Sheet for Patients:  PinkCheek.be Fact Sheet for Healthcare Providers:  GravelBags.it This test is not yet ap proved or cleared by the [place] FDA and  has been authorized for detection and/or diagnosis of SARS-CoV-2 by FDA under an Emergency Use Authorization (EUA). This EUA will remain  in effect (meaning this test can be used) for the duration of the COVID-19 declaration under Section 564(b)(1) of the Act, 21 U.S.C. section 360bbb-3(b)(1), unless the authorization is terminated or revoked sooner.    Influenza A by PCR NEGATIVE NEGATIVE Final   Influenza B by PCR NEGATIVE NEGATIVE Final    Comment: (NOTE) The Xpert Xpress SARS-CoV-2/FLU/RSV assay is intended as an aid in  the diagnosis of  influenza from Nasopharyngeal swab specimens and  should not be used as a sole basis for treatment. Nasal washings and  aspirates are unacceptable for Xpert Xpress SARS-CoV-2/FLU/RSV  testing. Fact Sheet for Patients: PinkCheek.be Fact Sheet for Healthcare Providers: GravelBags.it This test is not yet approved or cleared by the [place] FDA and  has been authorized for detection and/or diagnosis of SARS-CoV-2 by  FDA under an Emergency Use Authorization (EUA). This EUA will remain  in effect (meaning this test can be used) for the duration of the  Covid-19 declaration under Section 564(b)(1) of the Act, 21  U.S.C. section 360bbb-3(b)(1), unless the authorization is  terminated or revoked. Performed at [facility], [place] [address], [place], [place] [address]   Blood culture (routine x 2)     Status: None   Collection Time: 07/03/19  9:15 AM   Specimen: BLOOD  Result Value Ref Range Status   Specimen Description BLOOD LEFT ANTECUBITAL  Final   Special Requests   Final    BOTTLES DRAWN AEROBIC AND ANAEROBIC Blood Culture results may not be optimal due to an inadequate volume of blood received in culture bottles   Culture   Final    NO GROWTH 5 DAYS Performed at [facility], [place] [address], [place], [place] [address]    Report Status 07/08/2019 FINAL  Final  MRSA PCR Screening     Status: None   Collection Time: 07/04/19 12:46 AM   Specimen: Nasopharyngeal  Result Value Ref Range Status   MRSA by PCR NEGATIVE NEGATIVE Final    Comment:        The GeneXpert MRSA Assay (FDA approved for NASAL specimens only), is one component of a comprehensive MRSA colonization surveillance program. It is not intended to diagnose MRSA infection nor to guide or monitor treatment for MRSA infections. Performed at [facility], [place] [address], [place], [place] [address]          Radiology Studies: No results  found.  Scheduled Meds: . aspirin  81 mg Oral Daily  . budesonide (PULMICORT) nebulizer solution  0.5 mg Nebulization BID  . Chlorhexidine Gluconate Cloth  6 each Topical Daily  . fluticasone  2 spray Each Nare Daily  . heparin  5,000 Units Subcutaneous Q8H  . insulin aspart  0-20 Units Subcutaneous Q4H  . ipratropium-albuterol  3 mL Nebulization Q6H  . levETIRAcetam  1,000 mg Oral BID  . [START ON 07/10/2019] levofloxacin  750 mg Oral Q48H  . loratadine  10 mg Oral Daily  . metoprolol succinate  50 mg Oral Daily  . pantoprazole  40 mg Oral Q0600  . [START ON 07/12/2019] predniSONE  10 mg Oral Q breakfast  . predniSONE  50 mg Oral Q breakfast  . QUEtiapine  100 mg Oral BID  . rosuvastatin  10 mg Oral Daily  . sodium chloride flush  3 mL Intravenous Once   Continuous Infusions: . sodium chloride 10 mL/hr at 07/06/19 0608     LOS: 5 days    Time spent: 40 minutes    Irine Seal, MD [facility]  If 7PM-7AM, please contact night-coverage [email or web address] 07/08/2019, 2:50 PM

## 2019-07-09 DIAGNOSIS — R030 Elevated blood-pressure reading, without diagnosis of hypertension: Secondary | ICD-10-CM

## 2019-07-09 LAB — CBC
HCT: 52.2 % — ABNORMAL HIGH (ref 36.0–46.0)
Hemoglobin: 15.6 g/dL — ABNORMAL HIGH (ref 12.0–15.0)
MCH: 27.5 pg (ref 26.0–34.0)
MCHC: 29.9 g/dL — ABNORMAL LOW (ref 30.0–36.0)
MCV: 91.9 fL (ref 80.0–100.0)
Platelets: 187 10*3/uL (ref 150–400)
RBC: 5.68 MIL/uL — ABNORMAL HIGH (ref 3.87–5.11)
RDW: 14.2 % (ref 11.5–15.5)
WBC: 8.2 10*3/uL (ref 4.0–10.5)
nRBC: 0 % (ref 0.0–0.2)

## 2019-07-09 LAB — GLUCOSE, CAPILLARY
Glucose-Capillary: 125 mg/dL — ABNORMAL HIGH (ref 70–99)
Glucose-Capillary: 197 mg/dL — ABNORMAL HIGH (ref 70–99)
Glucose-Capillary: 179 mg/dL — ABNORMAL HIGH (ref 70–99)
Glucose-Capillary: 182 mg/dL — ABNORMAL HIGH (ref 70–99)
Glucose-Capillary: 225 mg/dL — ABNORMAL HIGH (ref 70–99)
Glucose-Capillary: 154 mg/dL — ABNORMAL HIGH (ref 70–99)

## 2019-07-09 LAB — BASIC METABOLIC PANEL
Anion gap: 10 (ref 5–15)
BUN: 46 mg/dL — ABNORMAL HIGH (ref 6–20)
CO2: 35 mmol/L — ABNORMAL HIGH (ref 22–32)
Calcium: 8.6 mg/dL — ABNORMAL LOW (ref 8.9–10.3)
Chloride: 90 mmol/L — ABNORMAL LOW (ref 98–111)
Creatinine, Ser: 1.2 mg/dL — ABNORMAL HIGH (ref 0.44–1.00)
GFR calc Af Amer: >60 mL/min (ref >60)
GFR calc non Af Amer: 52 mL/min — ABNORMAL LOW (ref >60)
Glucose, Bld: 138 mg/dL — ABNORMAL HIGH (ref 70–99)
Potassium: 3.7 mmol/L (ref 3.5–5.1)
Sodium: 135 mmol/L (ref 135–145)

## 2019-07-09 MED ORDER — SODIUM CHLORIDE 0.9 % IV SOLN: INTRAVENOUS | Status: DC

## 2019-07-09 MED ORDER — ORAL CARE MOUTH RINSE
15.0000 mL | Freq: Two times a day (BID) | OROMUCOSAL | Status: DC
  Administered 2019-07-09 – 2019-07-11 (×5): 15 mL via OROMUCOSAL

## 2019-07-09 MED ORDER — BUPROPION HCL ER (SR) 150 MG PO TB12
150.0000 mg | ORAL_TABLET | Freq: Every day | ORAL | Status: DC
  Administered 2019-07-09 – 2019-07-11 (×3): 150 mg via ORAL
  Filled 2019-07-09 (×3): qty 1

## 2019-07-09 MED ORDER — FLUOXETINE HCL 20 MG PO CAPS
20.0000 mg | ORAL_CAPSULE | Freq: Every day | ORAL | Status: DC
  Administered 2019-07-09 – 2019-07-11 (×3): 20 mg via ORAL
  Filled 2019-07-09 (×3): qty 1

## 2019-07-09 MED ORDER — IPRATROPIUM-ALBUTEROL 0.5-2.5 (3) MG/3ML IN SOLN
3.0000 mL | Freq: Three times a day (TID) | RESPIRATORY_TRACT | Status: DC
  Administered 2019-07-10 – 2019-07-11 (×4): 3 mL via RESPIRATORY_TRACT
  Filled 2019-07-09 (×3): qty 3

## 2019-07-09 MED ORDER — CHLORHEXIDINE GLUCONATE 0.12 % MT SOLN
15.0000 mL | Freq: Two times a day (BID) | OROMUCOSAL | Status: DC
  Administered 2019-07-09 – 2019-07-11 (×5): 15 mL via OROMUCOSAL
  Filled 2019-07-09 (×5): qty 15

## 2019-07-09 NOTE — Plan of Care (Signed)
  Problem: Clinical Measurements: Goal: Respiratory complications will improve Outcome: Progressing   Problem: Activity: Goal: Risk for activity intolerance will decrease Outcome: Progressing   Problem: Safety: Goal: Ability to remain free from injury will improve Outcome: Progressing

## 2019-07-09 NOTE — Progress Notes (Signed)
PROGRESS NOTE    Diane Gilbert  ZHG:[id number] DOB: 03-17-1966 DOA: 07/02/2019 PCP: Nolene Ebbs, MD   Brief Narrative:  54 yo female smoker presented to ER with chest discomfort and dyspnea.  She has hx of respiratory bronchiolitis with interstitial lung disease (RB ILD) on prednisone 10 mg daily, emphysema and chronic hypoxic respiratory failure on 4 liters oxygen.  She was found to have Lt MCL avulsion, and superimposed comminuted spiral fx of proximal fibula on 06/19/19.  Found to have acute hypoxia and hypercapnia, and CT chest showed changes of pneumonia.  COVID 19 test negative.  Started on Bipap, ABx, steroids.  PCCM asked to admit to ICU.  Assessment & Plan:   Principal Problem:   Acute on chronic respiratory failure with hypoxia and hypercapnia (HCC) Active Problems:   Acute on chronic diastolic heart failure (HCC)   Hypokalemia   Schizophrenia (HCC)   Chronic respiratory failure with hypoxia ([place])   Community acquired pneumonia   Acute respiratory failure with hypoxia (HCC)   Acute respiratory failure with hypoxia and hypercapnia (HCC)   History of CVA (cerebrovascular accident)   Seizure disorder ([place])   Acute metabolic encephalopathy   ARF (acute renal failure) (HCC)  1 acute on chronic hypoxemic hypercapnic respiratory failure likely secondary to multilobar pneumonia and acute on chronic diastolic heart failure Patient with a history of respiratory bronchiolitis with interstitial lung disease secondary to smoking per pulmonary.  Patient also with morbid obesity with obesity hypoventilation syndrome and hypercarbic failure.  Patient noted to have had a negative outpatient sleep study.  Patient with some clinical improvement with sats of 96% on 3 L nasal cannula.  Patient also per PT with sats dropping into the 80s on ambulation.  Patient on 4 L home O2.  Urine strep pneumococcus antigen was positive.  Chest x-ray worrisome for pneumonia versus bilateral CHF.  CT  angiogram chest negative for PE however this show right middle and right lower lobe infiltrate and probable left lower lobe infiltrate.  Patient with a urine output of 1.7 L over the past 24 hours.  Patient is - 18.737 L during this hospitalization.  Patient was on IV antibiotics and has subsequently been transitioned to oral Levaquin.  Continue Pulmicort, Flonase, Claritin, PPI, scheduled duo nebs, steroid taper as recommended by pulmonary.  Per pulmonary patient will benefit from NIPPV/BiPAP therapy at discharge which pulmonary is arranging.  Continue supportive care.  Follow.  2.  Multilobar pneumonia Noted on CT chest.  Patient presented with worsening shortness of breath and increased O2 requirements with acute on chronic hypoxemic and hypercarbic respiratory failure.  Urine strep pneumococcus antigen was positive.  Urine Legionella antigen negative.  Patient currently afebrile.  Patient was on IV antibiotics and has subsequently been transitioned to oral Levaquin to complete course of antibiotic treatment.  See #1.  3.  Acute on chronic diastolic heart failure Patient presented with increased O2 requirements from baseline, chest x-ray concerning for volume overload plus or minus pneumonia.  Patient was placed on IV Lasix and received a dose of metolazone.  Patient was on oral Lasix.  Patient with a urine output of 1.7 L over the past 24 hours.  Patient is -18.737 L during this hospitalization.  Current weight is 112.5 kg from 108.4 kg from 110 kg(07/05/19) from 108.2 kg on admission.  Patient with a bump in creatinine and as such Lasix and Cozaar were discontinued.  Will discontinue Toprol XL due to borderline blood pressure.  Monitor volume status  with gentle hydration.  Follow.    4.  CVA with seizure disorder Continue Keppra and Crestor.  Continue aspirin.  PT/OT.   5.  Acute metabolic encephalopathy Patient more alert this morning and following commands appropriately.  Patient states slept well  after receiving Seroquel and trazodone last night.  Patient able to tolerate BiPAP overnight.  Will resume patient's Wellbutrin, Prozac.  Continue to hold other psychiatric medications.  Continue treatment as in #1 2 and 3.  Supportive care.  Follow.  6.  History of schizophrenia Patient more alert this morning.  Following commands.  Resume patient's home regimen of Wellbutrin and Prozac.  Continue to hold Neurontin and Abilify.  Continue Seroquel and trazodone.   7.  Left MCL avulsion, superimposed community spiral fracture of the proximal tibial 06/19/2019 Lower extremity Dopplers negative for DVT.  Outpatient follow-up with orthopedics.  8.  Hypokalemia Secondary to diuresis.  Potassium currently at 3.7.  Follow.   9.  Acute renal failure Likely secondary to a prerenal azotemia secondary to overdiuresis.  Patient noted to have systolic blood pressures in the 80s on 07/08/2019.  Diuretics and ARB have been discontinued.  Renal function improving creatinine down to 1.20.  Increase IV fluids to 100 cc/h for the next 24 hours.  Follow.   10.  Hypotension Likely secondary to overdiuresis.  Patient received all antihypertensive medications yesterday morning.  Patient is Lasix and Cozaar discontinued due to acute renal failure.  Patient received a bolus of normal saline on 07/08/2019.  Renal function improving.  Blood pressure still borderline and as such we will increase IV fluids to 100 cc an hour for the next 24 hours.  Monitor volume status.    DVT prophylaxis: Heparin Code Status: Full Family Communication: Updated patient.  No family at bedside. Disposition Plan: Likely home with home health when clinically improved, no longer hypotensive hopefully in the next 24 to 48 hours.     Consultants:   None  Procedures:   CT angiogram chest 07/03/2019  Chest x-ray 07/02/2019, 07/04/2019  Lower extremity Dopplers 07/03/2019  Antimicrobials: Rocephin 1/04 Zithromax 1/04 x1 Vancomycin 1/04 >>>>  07/04/2019 Cefepime 1/04 >>>> 07/04/2019 Ceftriaxone 1/04 > 1/06  Oral Levaquin 07/06/2019   Micro Data:  SARS CoV2 PCR 1/04 >> negative Influenza PCR 1/04 >> A and B negative Pneumococcal Ag 1/04 >> positive Legionella Ag 1/04 >> negative  Blood 1/04 >> NGTD   Subjective: Patient more alert today.  Denies any chest pain.  Denies any significant shortness of breath.  Patient with complaints of dizziness from supine to standing position.  Patient noted to have borderline systolic blood pressures in the 100s.    Objective: Vitals:   07/09/19 0334 07/09/19 0742 07/09/19 0931 07/09/19 1044  BP: 114/67 118/64  100/60  Pulse: (!) 56 62  66  Resp: _0 Temp: 98.4 F (36.9 C)   97.7 F (36.5 C)  TempSrc: Axillary   Oral  SpO2: 91% 97% 93% 99%  Weight: 112.5 kg     Height:        Intake/Output Summary (Last 24 hours) at 07/09/2019 1141 Last data filed at 07/09/2019 1126 Gross per 24 hour  Intake 1264.73 ml  Output 3150 ml  Net -1885.27 ml   Filed Weights   07/05/19 0500 07/08/19 0401 07/09/19 0334  Weight: 110 kg 108.4 kg 112.5 kg    Examination:  General exam: NAD Respiratory system: Decreased bibasilar crackles.  No wheezes, no crackles.  Normal respiratory  effort.  Speaking in full sentences.  Cardiovascular system: Regular rate rhythm no murmurs rubs or gallops.  No JVD.  No lower extremity edema.  Gastrointestinal system: Abdomen is soft, nontender, nondistended, positive bowel sounds.  No rebound.  No guarding.  Central nervous system: Alert and oriented. No focal neurological deficits. Extremities: Symmetric 5 x 5 power. Skin: No rashes, lesions or ulcers Psychiatry: Judgement and insight appear normal. Mood & affect appropriate.     Data Reviewed: I have personally reviewed following labs and imaging studies  CBC: Recent Labs  Lab 07/03/19 1506 07/05/19 0120 07/05/19 1030 07/06/19 0440 07/07/19 0209 07/08/19 0853 07/09/19 0357  WBC 10.7* 9.5  --   11.9* 10.7* 9.5 8.2  NEUTROABS 7.6  --   --   --  6.3  --   --   HGB 13.8 14.8 18.4* 17.2* 17.6* 16.8* 15.6*  HCT 51.7* 50.8* 54.0* 57.8* 59.0* 56.1* 52.2*  MCV 104.2* 95.3  --  92.2 92.2 91.4 91.9  PLT 216 246  --  258 262 215 846   Basic Metabolic Panel: Recent Labs  Lab 07/03/19 1506 07/05/19 0120 07/05/19 1030 07/06/19 0440 07/07/19 0209 07/08/19 0853 07/09/19 0357  NA 143 136 136 138 136 130* 135  K 4.4 3.7 3.5 3.5 3.5 3.1* 3.7  CL 92* 82*  --  81* 82* 80* 90*  CO2 42* 41*  --  44* 39* 38* 35*  GLUCOSE 136* 180*  --  117* 128* 184* 138*  BUN 6 17  --  17 30* 47* 46*  CREATININE 0.87 0.74  --  0.76 0.95 1.98* 1.20*  CALCIUM 8.8* 8.9  --  9.3 9.3 8.5* 8.6*  MG 1.8 2.1  --  2.1 2.3  --   --   PHOS 4.6 1.9*  --  3.0  --   --   --    GFR: Estimated Creatinine Clearance: 68.1 mL/min (A) (by C-G formula based on SCr of 1.2 mg/dL (H)). Liver Function Tests: Recent Labs  Lab 07/03/19 1506  AST 31  ALT 16  ALKPHOS 94  BILITOT 1.1  PROT 7.9  ALBUMIN 3.9   No results for input(s): LIPASE, AMYLASE in the last 168 hours. No results for input(s): AMMONIA in the last 168 hours. Coagulation Profile: No results for input(s): INR, PROTIME in the last 168 hours. Cardiac Enzymes: No results for input(s): CKTOTAL, CKMB, CKMBINDEX, TROPONINI in the last 168 hours. BNP (last 3 results) No results for input(s): PROBNP in the last 8760 hours. HbA1C: No results for input(s): HGBA1C in the last 72 hours. CBG: Recent Labs  Lab 07/08/19 1725 07/08/19 1957 07/08/19 2345 07/09/19 0331 07/09/19 0751  GLUCAP 244* 202* 130* 125* 197*   Lipid Profile: No results for input(s): CHOL, HDL, LDLCALC, TRIG, CHOLHDL, LDLDIRECT in the last 72 hours. Thyroid Function Tests: No results for input(s): TSH, T4TOTAL, FREET4, T3FREE, THYROIDAB in the last 72 hours. Anemia Panel: No results for input(s): VITAMINB12, FOLATE, FERRITIN, TIBC, IRON, RETICCTPCT in the last 72 hours. Sepsis  Labs: Recent Labs  Lab 07/03/19 1506  LATICACIDVEN 1.0    Recent Results (from the past 240 hour(s))  Blood culture (routine x 2)     Status: None   Collection Time: 07/03/19  9:00 AM   Specimen: BLOOD RIGHT HAND  Result Value Ref Range Status   Specimen Description BLOOD RIGHT HAND  Final   Special Requests   Final    BOTTLES DRAWN AEROBIC AND ANAEROBIC Blood Culture results may not  be optimal due to an inadequate volume of blood received in culture bottles   Culture   Final    NO GROWTH 5 DAYS Performed at [facility], [place] [address], [place], [place] [address]    Report Status 07/08/2019 FINAL  Final  Respiratory Panel by RT PCR (Flu A&B, Covid) - Nasopharyngeal Swab     Status: None   Collection Time: 07/03/19  9:15 AM   Specimen: Nasopharyngeal Swab  Result Value Ref Range Status   SARS Coronavirus 2 by RT PCR NEGATIVE NEGATIVE Final    Comment: (NOTE) SARS-CoV-2 target nucleic acids are NOT DETECTED. The SARS-CoV-2 RNA is generally detectable in upper respiratoy specimens during the acute phase of infection. The lowest concentration of SARS-CoV-2 viral copies this assay can detect is 131 copies/mL. A negative result does not preclude SARS-Cov-2 infection and should not be used as the sole basis for treatment or other patient management decisions. A negative result may occur with  improper specimen collection/handling, submission of specimen other than nasopharyngeal swab, presence of viral mutation(s) within the areas targeted by this assay, and inadequate number of viral copies (<131 copies/mL). A negative result must be combined with clinical observations, patient history, and epidemiological information. The expected result is Negative. Fact Sheet for Patients:  PinkCheek.be Fact Sheet for Healthcare Providers:  GravelBags.it This test is not yet ap proved or cleared by the [place] FDA and  has  been authorized for detection and/or diagnosis of SARS-CoV-2 by FDA under an Emergency Use Authorization (EUA). This EUA will remain  in effect (meaning this test can be used) for the duration of the COVID-19 declaration under Section 564(b)(1) of the Act, 21 U.S.C. section 360bbb-3(b)(1), unless the authorization is terminated or revoked sooner.    Influenza A by PCR NEGATIVE NEGATIVE Final   Influenza B by PCR NEGATIVE NEGATIVE Final    Comment: (NOTE) The Xpert Xpress SARS-CoV-2/FLU/RSV assay is intended as an aid in  the diagnosis of influenza from Nasopharyngeal swab specimens and  should not be used as a sole basis for treatment. Nasal washings and  aspirates are unacceptable for Xpert Xpress SARS-CoV-2/FLU/RSV  testing. Fact Sheet for Patients: PinkCheek.be Fact Sheet for Healthcare Providers: GravelBags.it This test is not yet approved or cleared by the [place] FDA and  has been authorized for detection and/or diagnosis of SARS-CoV-2 by  FDA under an Emergency Use Authorization (EUA). This EUA will remain  in effect (meaning this test can be used) for the duration of the  Covid-19 declaration under Section 564(b)(1) of the Act, 21  U.S.C. section 360bbb-3(b)(1), unless the authorization is  terminated or revoked. Performed at [facility], [place] [address], [place], [place] [address]   Blood culture (routine x 2)     Status: None   Collection Time: 07/03/19  9:15 AM   Specimen: BLOOD  Result Value Ref Range Status   Specimen Description BLOOD LEFT ANTECUBITAL  Final   Special Requests   Final    BOTTLES DRAWN AEROBIC AND ANAEROBIC Blood Culture results may not be optimal due to an inadequate volume of blood received in culture bottles   Culture   Final    NO GROWTH 5 DAYS Performed at [facility], [place] [address] [place],  [address]    Report Status 07/08/2019 FINAL  Final  MRSA PCR  Screening     Status: None   Collection Time: 07/04/19 12:46 AM   Specimen: Nasopharyngeal  Result  Value Ref Range Status   MRSA by PCR NEGATIVE NEGATIVE Final    Comment:        The GeneXpert MRSA Assay (FDA approved for NASAL specimens only), is one component of a comprehensive MRSA colonization surveillance program. It is not intended to diagnose MRSA infection nor to guide or monitor treatment for MRSA infections. Performed at [facility], [place] [address] [place], [place] [address]          Radiology Studies: No results found.      Scheduled Meds: . aspirin  81 mg Oral Daily  . budesonide (PULMICORT) nebulizer solution  0.5 mg Nebulization BID  . Chlorhexidine Gluconate Cloth  6 each Topical Daily  . fluticasone  2 spray Each Nare Daily  . heparin  5,000 Units Subcutaneous Q8H  . insulin aspart  0-20 Units Subcutaneous Q4H  . ipratropium-albuterol  3 mL Nebulization Q6H  . levETIRAcetam  1,000 mg Oral BID  . [START ON 07/10/2019] levofloxacin  750 mg Oral Q48H  . loratadine  10 mg Oral Daily  . metoprolol succinate  50 mg Oral Daily  . pantoprazole  40 mg Oral Q0600  . [START ON 07/12/2019] predniSONE  10 mg Oral Q breakfast  . predniSONE  50 mg Oral Q breakfast  . QUEtiapine  100 mg Oral BID  . rosuvastatin  10 mg Oral Daily  . sodium chloride flush  3 mL Intravenous Once   Continuous Infusions: . sodium chloride 10 mL/hr at 07/06/19 0608  . sodium chloride 100 mL/hr at 07/08/19 2006     LOS: 6 days    Time spent: 40 minutes    Irine Seal, MD [facility]  If 7PM-7AM, please contact night-coverage [email or web address] 07/09/2019, 11:41 AM

## 2019-07-10 ENCOUNTER — Ambulatory Visit: Payer: Medicaid Other | Admitting: Cardiology

## 2019-07-10 LAB — BASIC METABOLIC PANEL
Anion gap: 11 (ref 5–15)
BUN: 22 mg/dL — ABNORMAL HIGH (ref 6–20)
CO2: 32 mmol/L (ref 22–32)
Calcium: 9.2 mg/dL (ref 8.9–10.3)
Chloride: 94 mmol/L — ABNORMAL LOW (ref 98–111)
Creatinine, Ser: 0.73 mg/dL (ref 0.44–1.00)
GFR calc Af Amer: >60 mL/min (ref >60)
GFR calc non Af Amer: >60 mL/min (ref >60)
Glucose, Bld: 170 mg/dL — ABNORMAL HIGH (ref 70–99)
Potassium: 4.1 mmol/L (ref 3.5–5.1)
Sodium: 137 mmol/L (ref 135–145)

## 2019-07-10 LAB — CBC
HCT: 52.5 % — ABNORMAL HIGH (ref 36.0–46.0)
Hemoglobin: 15.2 g/dL — ABNORMAL HIGH (ref 12.0–15.0)
MCH: 27.2 pg (ref 26.0–34.0)
MCHC: 29 g/dL — ABNORMAL LOW (ref 30.0–36.0)
MCV: 94.1 fL (ref 80.0–100.0)
Platelets: 190 10*3/uL (ref 150–400)
RBC: 5.58 MIL/uL — ABNORMAL HIGH (ref 3.87–5.11)
RDW: 14.3 % (ref 11.5–15.5)
WBC: 7.6 10*3/uL (ref 4.0–10.5)
nRBC: 0 % (ref 0.0–0.2)

## 2019-07-10 LAB — GLUCOSE, CAPILLARY
Glucose-Capillary: 121 mg/dL — ABNORMAL HIGH (ref 70–99)
Glucose-Capillary: 148 mg/dL — ABNORMAL HIGH (ref 70–99)
Glucose-Capillary: 148 mg/dL — ABNORMAL HIGH (ref 70–99)
Glucose-Capillary: 154 mg/dL — ABNORMAL HIGH (ref 70–99)
Glucose-Capillary: 204 mg/dL — ABNORMAL HIGH (ref 70–99)
Glucose-Capillary: 298 mg/dL — ABNORMAL HIGH (ref 70–99)
Glucose-Capillary: 152 mg/dL — ABNORMAL HIGH (ref 70–99)

## 2019-07-10 MED ORDER — METOPROLOL SUCCINATE ER 25 MG PO TB24
25.0000 mg | ORAL_TABLET | Freq: Every day | ORAL | Status: DC
  Administered 2019-07-10 – 2019-07-11 (×2): 25 mg via ORAL
  Filled 2019-07-10 (×2): qty 1

## 2019-07-10 MED ORDER — LEVOFLOXACIN 750 MG PO TABS
750.0000 mg | ORAL_TABLET | Freq: Every day | ORAL | Status: DC
  Administered 2019-07-11: 750 mg via ORAL
  Filled 2019-07-10 (×2): qty 1

## 2019-07-10 MED ORDER — ARIPIPRAZOLE 5 MG PO TABS
5.0000 mg | ORAL_TABLET | Freq: Every day | ORAL | Status: DC
  Administered 2019-07-10 – 2019-07-11 (×2): 5 mg via ORAL
  Filled 2019-07-10 (×2): qty 1

## 2019-07-10 NOTE — Progress Notes (Signed)
RT placed patient on CPAP HS. 5L O2 bleed in needed. Patient tolerating well at this time.  

## 2019-07-10 NOTE — Progress Notes (Signed)
PROGRESS NOTE    Diane Gilbert  MVH:[id number] DOB: 1965-09-27 DOA: 07/02/2019 PCP: Nolene Ebbs, MD   Brief Narrative:  54 yo female smoker presented to ER with chest discomfort and dyspnea.  She has hx of respiratory bronchiolitis with interstitial lung disease (RB ILD) on prednisone 10 mg daily, emphysema and chronic hypoxic respiratory failure on 4 liters oxygen.  She was found to have Lt MCL avulsion, and superimposed comminuted spiral fx of proximal fibula on 06/19/19.  Found to have acute hypoxia and hypercapnia, and CT chest showed changes of pneumonia.  COVID 19 test negative.  Started on Bipap, ABx, steroids.  PCCM asked to admit to ICU.  Assessment & Plan:   Principal Problem:   Acute on chronic respiratory failure with hypoxia and hypercapnia (HCC) Active Problems:   Acute on chronic diastolic heart failure (HCC)   Hypokalemia   Schizophrenia (HCC)   Chronic respiratory failure with hypoxia ([place])   Community acquired pneumonia   Acute respiratory failure with hypoxia (HCC)   Acute respiratory failure with hypoxia and hypercapnia (HCC)   History of CVA (cerebrovascular accident)   Seizure disorder ([place])   Acute metabolic encephalopathy   ARF (acute renal failure) (HCC)  1 acute on chronic hypoxemic hypercapnic respiratory failure likely secondary to multilobar pneumonia and acute on chronic diastolic heart failure Patient with a history of respiratory bronchiolitis with interstitial lung disease secondary to smoking per pulmonary.  Patient also with morbid obesity with obesity hypoventilation syndrome and hypercarbic failure.  Patient noted to have had a negative outpatient sleep study.  Patient improving clinically and with sats of 94% on 3 L nasal cannula.  Patient also per PT with sats dropping into the 80s on ambulation early on in the hospitalization..  Patient on 4 L home O2.  Urine strep pneumococcus antigen was positive.  Chest x-ray worrisome for pneumonia  versus bilateral CHF.  CT angiogram chest negative for PE however this show right middle and right lower lobe infiltrate and probable left lower lobe infiltrate.  Patient with a urine output of 5.1 L over the past 24 hours.  Patient is -20.964 L during this hospitalization.  Patient was on IV antibiotics and has subsequently been transitioned to oral Levaquin.  Continue Pulmicort, Flonase, Claritin, PPI, scheduled duo nebs, steroid taper as recommended by pulmonary.  Per pulmonary patient will benefit from NIPPV/BiPAP therapy at discharge which pulmonary is arranging.  Continue supportive care.  Follow.  2.  Multilobar pneumonia Noted on CT chest.  Patient presented with worsening shortness of breath and increased O2 requirements with acute on chronic hypoxemic and hypercarbic respiratory failure.  Urine strep pneumococcus antigen was positive.  Urine Legionella antigen negative.  Patient currently afebrile.  Patient was on IV antibiotics and has subsequently been transitioned to oral Levaquin to complete course of antibiotic treatment.  See #1.  3.  Acute on chronic diastolic heart failure Patient presented with increased O2 requirements from baseline, chest x-ray concerning for volume overload plus or minus pneumonia.  Patient was placed on IV Lasix and received a dose of metolazone.  Patient was on oral Lasix.  Patient with a urine output of 5.1 L over the past 24 hours.  Patient is - 20.964 L during this hospitalization.  Current weight is 105.8 kg from 112.5 kg from 108.4 kg from 110 kg(07/05/19) from 108.2 kg on admission.  Patient with a bump in creatinine early on, and as such Lasix and Cozaar were discontinued.  Resume Toprol-XL as blood  pressure has improved.  Saline lock IV fluids.  Continue to hold diuretics as patient auto diuresing.  Likely resume diuretics in the next 1 to 2 days on discharge.  Follow.  4.  CVA with seizure disorder Stable.  Continue Keppra and Crestor.  Continue aspirin.   PT/OT.   5.  Acute metabolic encephalopathy Patient more alert this morning and following commands appropriately.  Patient states slept well after receiving Seroquel and trazodone.  Patient able to tolerate BiPAP overnight.  Prozac and Wellbutrin have been resumed.  Will resume Abilify today.  Continue treatment as in #1 2 and 3.  Supportive care.  Follow.  6.  History of schizophrenia Patient more alert this morning.  Following commands.  Resumed patient's home regimen of Wellbutrin and Prozac and Seroquel and trazodone.  Continue to hold Neurontin.  Will resume Abilify.   7.  Left MCL avulsion, superimposed community spiral fracture of the proximal tibial 06/19/2019 Lower extremity Dopplers negative for DVT.  Outpatient follow-up with orthopedics.  8.  Hypokalemia Secondary to diuresis.  Repleted.  Potassium of 4.1.   9.  Acute renal failure Likely secondary to a prerenal azotemia secondary to overdiuresis.  Patient noted to have systolic blood pressures in the 80s on 07/08/2019.  Diuretics and ARB have been discontinued.  Renal function improving creatinine down to 0.73.  Saline lock IV fluids.  Follow.  10.  Hypotension Likely secondary to overdiuresis.  Patient received all antihypertensive medications yesterday morning.  Patient is Lasix and Cozaar discontinued due to acute renal failure.  Patient received a bolus of normal saline on 07/08/2019.  Renal function improving.  Blood pressure has improved.  Saline lock IV fluids.  Resume beta-blocker monitor closely.    DVT prophylaxis: Heparin Code Status: Full Family Communication: Updated patient.  No family at bedside. Disposition Plan: Likely home with home health when clinically improved, no longer hypotensive hopefully tomorrow.     Consultants:   None  Procedures:   CT angiogram chest 07/03/2019  Chest x-ray 07/02/2019, 07/04/2019  Lower extremity Dopplers 07/03/2019  Antimicrobials: Rocephin 1/04 Zithromax 1/04 x1 Vancomycin  1/04 >>>> 07/04/2019 Cefepime 1/04 >>>> 07/04/2019 Ceftriaxone 1/04 > 1/06  Oral Levaquin 07/06/2019   Micro Data:  SARS CoV2 PCR 1/04 >> negative Influenza PCR 1/04 >> A and B negative Pneumococcal Ag 1/04 >> positive Legionella Ag 1/04 >> negative  Blood 1/04 >> NGTD   Subjective: Patient alert.  Sitting up in bed.  States dizziness has improved.  Denies any chest pain.  Denies any significant shortness of breath.  PT at bedside.  Objective: Vitals:   07/10/19 0715 07/10/19 0822 07/10/19 0823 07/10/19 0845  BP:  (!) 150/58  (!) 152/62  Pulse: 67  (!) 48 84  Resp: _0 Temp:  98.2 F (36.8 C)  98.7 F (37.1 C)  TempSrc:    Oral  SpO2: 96%  93% 94%  Weight:      Height:        Intake/Output Summary (Last 24 hours) at 07/10/2019 1020 Last data filed at 07/10/2019 0900 Gross per 24 hour  Intake 2558.31 ml  Output 5101 ml  Net -2542.69 ml   Filed Weights   07/09/19 0334 07/09/19 1838 07/10/19 0254  Weight: 112.5 kg 102.3 kg 105.8 kg    Examination:  General exam: NAD Respiratory system: Minimal bibasilar crackles.  No wheezing.  Speaking in full sentences.  Normal respiratory effort.  Cardiovascular system: RRR no murmurs rubs or gallops.  No JVD.  No lower extremity edema.   Gastrointestinal system: Abdomen is soft, nontender, nondistended, positive bowel sounds.  No rebound.  No guarding.  Central nervous system: Alert and oriented. No focal neurological deficits. Extremities: Symmetric 5 x 5 power. Skin: No rashes, lesions or ulcers Psychiatry: Judgement and insight appear normal. Mood & affect appropriate.     Data Reviewed: I have personally reviewed following labs and imaging studies  CBC: Recent Labs  Lab 07/03/19 1506 07/06/19 0440 07/07/19 0209 07/08/19 0853 07/09/19 0357 07/10/19 0451  WBC 10.7* 11.9* 10.7* 9.5 8.2 7.6  NEUTROABS 7.6  --  6.3  --   --   --   HGB 13.8 17.2* 17.6* 16.8* 15.6* 15.2*  HCT 51.7* 57.8* 59.0* 56.1* 52.2* 52.5*    MCV 104.2* 92.2 92.2 91.4 91.9 94.1  PLT 216 258 262 215 187 165   Basic Metabolic Panel: Recent Labs  Lab 07/03/19 1506 07/05/19 0120 07/06/19 0440 07/07/19 0209 07/08/19 0853 07/09/19 0357 07/10/19 0451  NA 143 136 138 136 130* 135 137  K 4.4 3.7 3.5 3.5 3.1* 3.7 4.1  CL 92* 82* 81* 82* 80* 90* 94*  CO2 42* 41* 44* 39* 38* 35* 32  GLUCOSE 136* 180* 117* 128* 184* 138* 170*  BUN _0 30* 47* 46* 22*  CREATININE 0.87 0.74 0.76 0.95 1.98* 1.20* 0.73  CALCIUM 8.8* 8.9 9.3 9.3 8.5* 8.6* 9.2  MG 1.8 2.1 2.1 2.3  --   --   --   PHOS 4.6 1.9* 3.0  --   --   --   --    GFR: Estimated Creatinine Clearance: 102.3 mL/min (by C-G formula based on SCr of 0.73 mg/dL). Liver Function Tests: Recent Labs  Lab 07/03/19 1506  AST 31  ALT 16  ALKPHOS 94  BILITOT 1.1  PROT 7.9  ALBUMIN 3.9   No results for input(s): LIPASE, AMYLASE in the last 168 hours. No results for input(s): AMMONIA in the last 168 hours. Coagulation Profile: No results for input(s): INR, PROTIME in the last 168 hours. Cardiac Enzymes: No results for input(s): CKTOTAL, CKMB, CKMBINDEX, TROPONINI in the last 168 hours. BNP (last 3 results) No results for input(s): PROBNP in the last 8760 hours. HbA1C: No results for input(s): HGBA1C in the last 72 hours. CBG: Recent Labs  Lab 07/09/19 2247 07/10/19 0111 07/10/19 0329 07/10/19 0645 07/10/19 0736  GLUCAP 154* 121* 148* 148* 154*   Lipid Profile: No results for input(s): CHOL, HDL, LDLCALC, TRIG, CHOLHDL, LDLDIRECT in the last 72 hours. Thyroid Function Tests: No results for input(s): TSH, T4TOTAL, FREET4, T3FREE, THYROIDAB in the last 72 hours. Anemia Panel: No results for input(s): VITAMINB12, FOLATE, FERRITIN, TIBC, IRON, RETICCTPCT in the last 72 hours. Sepsis Labs: Recent Labs  Lab 07/03/19 1506  LATICACIDVEN 1.0    Recent Results (from the past 240 hour(s))  Blood culture (routine x 2)     Status: None   Collection Time: 07/03/19  9:00  AM   Specimen: BLOOD RIGHT HAND  Result Value Ref Range Status   Specimen Description BLOOD RIGHT HAND  Final   Special Requests   Final    BOTTLES DRAWN AEROBIC AND ANAEROBIC Blood Culture results may not be optimal due to an inadequate volume of blood received in culture bottles   Culture   Final    NO GROWTH 5 DAYS Performed at [facility], [place] [address] [place], [place] [address]    Report Status 07/08/2019 FINAL  Final  Respiratory Panel by RT PCR (Flu A&B, Covid) - Nasopharyngeal Swab     Status: None   Collection Time: 07/03/19  9:15 AM   Specimen: Nasopharyngeal Swab  Result Value Ref Range Status   SARS Coronavirus 2 by RT PCR NEGATIVE NEGATIVE Final    Comment: (NOTE) SARS-CoV-2 target nucleic acids are NOT DETECTED. The SARS-CoV-2 RNA is generally detectable in upper respiratoy specimens during the acute phase of infection. The lowest concentration of SARS-CoV-2 viral copies this assay can detect is 131 copies/mL. A negative result does not preclude SARS-Cov-2 infection and should not be used as the sole basis for treatment or other patient management decisions. A negative result may occur with  improper specimen collection/handling, submission of specimen other than nasopharyngeal swab, presence of viral mutation(s) within the areas targeted by this assay, and inadequate number of viral copies (<131 copies/mL). A negative result must be combined with clinical observations, patient history, and epidemiological information. The expected result is Negative. Fact Sheet for Patients:  PinkCheek.be Fact Sheet for Healthcare Providers:  GravelBags.it This test is not yet ap proved or cleared by the [place] FDA and  has been authorized for detection and/or diagnosis of SARS-CoV-2 by FDA under an Emergency Use Authorization (EUA). This EUA will remain  in effect (meaning this test can be used) for the  duration of the COVID-19 declaration under Section 564(b)(1) of the Act, 21 U.S.C. section 360bbb-3(b)(1), unless the authorization is terminated or revoked sooner.    Influenza A by PCR NEGATIVE NEGATIVE Final   Influenza B by PCR NEGATIVE NEGATIVE Final    Comment: (NOTE) The Xpert Xpress SARS-CoV-2/FLU/RSV assay is intended as an aid in  the diagnosis of influenza from Nasopharyngeal swab specimens and  should not be used as a sole basis for treatment. Nasal washings and  aspirates are unacceptable for Xpert Xpress SARS-CoV-2/FLU/RSV  testing. Fact Sheet for Patients: PinkCheek.be Fact Sheet for Healthcare Providers: GravelBags.it This test is not yet approved or cleared by the [place] FDA and  has been authorized for detection and/or diagnosis of SARS-CoV-2 by  FDA under an Emergency Use Authorization (EUA). This EUA will remain  in effect (meaning this test can be used) for the duration of the  Covid-19 declaration under Section 564(b)(1) of the Act, 21  U.S.C. section 360bbb-3(b)(1), unless the authorization is  terminated or revoked. Performed at [facility], [place] [address] [place], [place] [address]   Blood culture (routine x 2)     Status: None   Collection Time: 07/03/19  9:15 AM   Specimen: BLOOD  Result Value Ref Range Status   Specimen Description BLOOD LEFT ANTECUBITAL  Final   Special Requests   Final    BOTTLES DRAWN AEROBIC AND ANAEROBIC Blood Culture results may not be optimal due to an inadequate volume of blood received in culture bottles   Culture   Final    NO GROWTH 5 DAYS Performed at [facility], [place] [address] [place], [place] [address]    Report Status 07/08/2019 FINAL  Final  MRSA PCR Screening     Status: None   Collection Time: 07/04/19 12:46 AM   Specimen: Nasopharyngeal  Result Value Ref Range Status   MRSA by PCR NEGATIVE NEGATIVE Final    Comment:        The  GeneXpert MRSA Assay (FDA approved for NASAL specimens only), is one component of a comprehensive MRSA colonization surveillance program. It is not intended  to diagnose MRSA infection nor to guide or monitor treatment for MRSA infections. Performed at [facility], [place] [address], [place], [place] [address]          Radiology Studies: No results found.      Scheduled Meds: . ARIPiprazole  5 mg Oral Daily  . aspirin  81 mg Oral Daily  . budesonide (PULMICORT) nebulizer solution  0.5 mg Nebulization BID  . buPROPion  150 mg Oral Daily  . chlorhexidine  15 mL Mouth Rinse BID  . Chlorhexidine Gluconate Cloth  6 each Topical Daily  . FLUoxetine  20 mg Oral Daily  . fluticasone  2 spray Each Nare Daily  . heparin  5,000 Units Subcutaneous Q8H  . insulin aspart  0-20 Units Subcutaneous Q4H  . ipratropium-albuterol  3 mL Nebulization TID  . levETIRAcetam  1,000 mg Oral BID  . levofloxacin  750 mg Oral Q48H  . loratadine  10 mg Oral Daily  . mouth rinse  15 mL Mouth Rinse q12n4p  . pantoprazole  40 mg Oral Q0600  . [START ON 07/12/2019] predniSONE  10 mg Oral Q breakfast  . predniSONE  50 mg Oral Q breakfast  . QUEtiapine  100 mg Oral BID  . rosuvastatin  10 mg Oral Daily   Continuous Infusions: . sodium chloride 10 mL/hr at 07/06/19 0608     LOS: 7 days    Time spent: 40 minutes    Irine Seal, MD [facility]  If 7PM-7AM, please contact night-coverage [email or web address] 07/10/2019, 10:20 AM

## 2019-07-10 NOTE — Plan of Care (Signed)
  Problem: Clinical Measurements: Goal: Will remain free from infection Outcome: Progressing   Problem: Safety: Goal: Ability to remain free from injury will improve Outcome: Progressing

## 2019-07-10 NOTE — TOC Initial Note (Addendum)
Transition of Care [facility]) - Initial/Assessment Note    Patient Details  Name: Diane Gilbert MRN: [id number] Date of Birth: 1965-12-03  [facility] CM/SW Contact:    Zenon Mayo, RN Phone Number: 07/10/2019, 11:16 AM  Clinical Narrative:                 Patient is for dc today, NCM offered choice, she states she has no preference for which [facility] agency she uses.  NCM made referral to [facility] for HHPT/HHOT, soc will begin on Friday. She is also set up with NIV with [facility], her friend will transport her home today and will be there while [employer] is going over the NIV with patient and set up the NIV.    The 3 n 1, and rollator will be brought up to patient's room prior to dc.  Patient to dc in next 24 to 48 hrs.  Zack with [employer] informed.  Expected Discharge Plan: [place] Barriers to Discharge: No Barriers Identified   Patient Goals and CMS Choice Patient states their goals for this hospitalization and ongoing recovery are:: get better CMS Medicare.gov Compare Post Acute Care list provided to:: Patient Choice offered to / list presented to : Patient  Expected Discharge Plan and Services Expected Discharge Plan: [place] In-house Referral: NA Discharge Planning Services: CM Consult Post Acute Care Choice: Durable Medical Equipment, Home Health Living arrangements for the past 2 months: Apartment                 DME Arranged: Walker rolling with seat, 3-N-1, NIV DME Agency: [employer] Date DME Agency Contacted: 07/10/19 Time DME Agency Contacted: 53 Representative spoke with at DME Agency: zack HH Arranged: PT, OT [place] Agency: [facility] [place] Date [place]: 07/10/19 Time [place]: 60 Representative spoke with at [place]: [place] Arrangements/Services Living arrangements for the past 2 months: [place] with:: Self Patient language and need for interpreter reviewed::  Yes Do you feel safe going back to the place where you live?: Yes      Need for Family Participation in Patient Care: Yes (Comment) Care giver support system in place?: Yes (comment)   Criminal Activity/Legal Involvement Pertinent to Current Situation/Hospitalization: No - Comment as needed  Activities of Daily Living Home Assistive Devices/Equipment: Cane (specify quad or straight), Walker (specify type) ADL Screening (condition at time of admission) Patient's cognitive ability adequate to safely complete daily activities?: Yes Is the patient deaf or have difficulty hearing?: No Does the patient have difficulty seeing, even when wearing glasses/contacts?: No Does the patient have difficulty concentrating, remembering, or making decisions?: No Patient able to express need for assistance with ADLs?: Yes Does the patient have difficulty dressing or bathing?: No Independently performs ADLs?: Yes (appropriate for developmental age) Does the patient have difficulty walking or climbing stairs?: Yes Weakness of Legs: Both Weakness of Arms/Hands: None  Permission Sought/Granted                  Emotional Assessment   Attitude/Demeanor/Rapport: Engaged Affect (typically observed): Appropriate Orientation: : Oriented to Self, Oriented to Place, Oriented to  Time, Oriented to Situation Alcohol / Substance Use: Not Applicable Psych Involvement: No (comment)  Admission diagnosis:  Shortness of breath [R06.02] Respiratory failure ([place]) [J96.90] Hypoxia [R09.02] Acute respiratory failure with hypoxia ([place]) [J96.01] Acute respiratory failure with hypoxia and hypercapnia ([place]) [J96.01, J96.02] Chest pain, unspecified type [R07.9] Community acquired  pneumonia, unspecified laterality [J18.9] Patient Active Problem List   Diagnosis Date Noted  . ARF (acute renal failure) ([place]) 07/08/2019  . Acute metabolic encephalopathy 28/20/6015  . Acute on chronic diastolic heart failure ([place])   .  History of CVA (cerebrovascular accident)   . Seizure disorder ([place])   . Acute respiratory failure with hypoxia and hypercapnia ([place]) 07/03/2019  . Hx of sleep apnea 03/29/2019  . Status epilepticus ([place]) 03/29/2019  . Acute diastolic CHF (congestive heart failure) ([place]) 03/08/2019  . Acute encephalopathy 03/07/2019  . Ischemic stroke ([place]) 03/07/2019  . Acute respiratory failure with hypoxia ([place]) 02/26/2019  . Falls 02/06/2019  . Depression 02/06/2019  . Acute on chronic diastolic CHF (congestive heart failure) ([place]) 05/09/2017  . Acute on chronic respiratory failure with hypoxia and hypercapnia ([place]) 05/09/2017  . Facial swelling 03/23/2017  . Chronic respiratory failure with hypoxia ([place]) 03/23/2017  . Community acquired pneumonia   . OSA (obstructive sleep apnea) 07/21/2016  . Acute diastolic congestive heart failure ([place])   . Schizophrenia ([place]) 03/24/2016  . Leg swelling 01/16/2016  . Interstitial lung disease ([place]) 07/16/2014  . Respiratory bronchiolitis interstitial lung disease ([place]) 12/26/2013  . Irregular menstrual cycle 01/23/2013  . Smoking addiction 01/23/2013  . Alcohol abuse 02/19/2012  . Hyponatremia 02/19/2012  . Abdominal pain 02/19/2012  . Hypokalemia 02/19/2012   PCP:  Nolene Ebbs, MD Pharmacy:   [facility] [place], [place] [address] Phone: [phone] Fax: [place], [place] [address] Phone: [phone] Fax: [phone]     Social Determinants of Health (SDOH) Interventions    Readmission Risk Interventions Readmission Risk Prevention Plan 07/10/2019  Transportation Screening Complete  Medication Review ([place]) Complete  PCP or Specialist appointment within 3-5 days of discharge Complete  HRI or [place] Complete  SW Recovery Care/Counseling Consult  Complete  [place] Not Applicable  Some recent data might be hidden

## 2019-07-10 NOTE — Progress Notes (Signed)
Patient Bi-pap setting as charted is a flow rate 5L and a large full face mask. Thank you. J Daney Moor RT [id number]

## 2019-07-10 NOTE — Progress Notes (Deleted)
Primary Physician/Referring:  Nolene Ebbs, MD  Patient ID: Diane Gilbert, female    DOB: 1965-11-10, 54 y.o.   MRN: [id number]  No chief complaint on file.  HPI:    Diane Gilbert  is a 54 y.o. with severe COPD and oxygen dependent, hypertension, respiratory bronchiolitis with interstitial lung disease, chronic diastolic heart failure, obstructive sleep apnea noncompliant with CPAP, ongoing tobacco use disorder, ongoing substance use with cocaine is here for f/u of chronic diastolic CHF.   Past Medical History:  Diagnosis Date  . Alcohol abuse   . Anxiety   . Arthritis   . Chronic diastolic CHF (congestive heart failure) ([place])   . Chronic respiratory failure with hypoxia and hypercapnia ([place]) 05/09/2017  . Community acquired pneumonia   . CVA (cerebral vascular accident) ([place])   . Depression   . Headache(784.0)   . Hypertension   . Insomnia   . Respiratory bronchiolitis interstitial lung disease ([place])   . Schizophrenia ([place])   . Seizure [facility])    Past Surgical History:  Procedure Laterality Date  . BACK SURGERY    . LUNG BIOPSY Left 07/16/2014   Procedure: LUNG BIOPSY;  Surgeon: Grace Isaac, MD;  Location: [facility];  Service: Thoracic;  Laterality: Left;  Marland Kitchen MULTIPLE TOOTH EXTRACTIONS    . RIGHT/LEFT HEART CATH AND CORONARY ANGIOGRAPHY N/A 01/04/2018   Procedure: RIGHT/LEFT HEART CATH AND CORONARY ANGIOGRAPHY;  Surgeon: Adrian Prows, MD;  Location: [place] [facility];  Service: Cardiovascular;  Laterality: N/A;  . VIDEO ASSISTED THORACOSCOPY Left 07/16/2014   Procedure: VIDEO ASSISTED THORACOSCOPY;  Surgeon: Grace Isaac, MD;  Location: [facility];  Service: Thoracic;  Laterality: Left;  Marland Kitchen VIDEO BRONCHOSCOPY Bilateral 04/11/2014   Procedure: VIDEO BRONCHOSCOPY WITH FLUORO;  Surgeon: Kathee Delton, MD;  Location: [facility];  Service: Cardiopulmonary;  Laterality: Bilateral;  . VIDEO BRONCHOSCOPY N/A 07/16/2014   Procedure: VIDEO BRONCHOSCOPY;  Surgeon: Grace Isaac, MD;  [facility];  Service: Thoracic;  Laterality: N/A;  . WEDGE RESECTION Left 07/16/2014   Procedure: WEDGE RESECTION;  Surgeon: Grace Isaac, MD;  Location: [facility];  Service: Thoracic;  Laterality: Left;  left upper lobe lung resection   Social History   Tobacco Use  . Smoking status: Current Every Day Smoker    Packs/day: 1.50    Years: 30.00    Pack years: 45.00    Types: Cigarettes  . Smokeless tobacco: Never Used  . Tobacco comment: smoking 3 cigarettes daily "every now and then" 11/04/16  Substance Use Topics  . Alcohol use: Yes    Alcohol/week: 0.0 standard drinks    Comment: occasional      ROS  ***ROS Objective  Last menstrual period 04/06/2017.  Vitals with BMI 07/10/2019 07/10/2019 07/10/2019  Height - - -  Weight - - -  BMI - - -  Systolic - 875 643  Diastolic - 62 62  Pulse 91 83 84  Some encounter information is confidential and restricted. Go to Review Flowsheets activity to see all data.     ***Physical Exam Laboratory examination:   Recent Labs    07/08/19 0853 07/09/19 0357 07/10/19 0451  NA 130* 135 137  K 3.1* 3.7 4.1  CL 80* 90* 94*  CO2 38* 35* 32  GLUCOSE 184* 138* 170*  BUN 47* 46* 22*  CREATININE 1.98* 1.20* 0.73  CALCIUM 8.5* 8.6* 9.2  GFRNONAA 29* 52* >60  GFRAA 33* >60 >60   estimated creatinine clearance  is 102.3 mL/min (by C-G formula based on SCr of 0.73 mg/dL).  CMP Latest Ref Rng & Units 07/10/2019 07/09/2019 07/08/2019  Glucose 70 - 99 mg/dL 170(H) 138(H) 184(H)  BUN 6 - 20 mg/dL 22(H) 46(H) 47(H)  Creatinine 0.44 - 1.00 mg/dL 0.73 1.20(H) 1.98(H)  Sodium 135 - 145 mmol/L 137 135 130(L)  Potassium 3.5 - 5.1 mmol/L 4.1 3.7 3.1(L)  Chloride 98 - 111 mmol/L 94(L) 90(L) 80(L)  CO2 22 - 32 mmol/L 32 35(H) 38(H)  Calcium 8.9 - 10.3 mg/dL 9.2 8.6(L) 8.5(L)  Total Protein 6.5 - 8.1 g/dL - - -  Total Bilirubin 0.3 - 1.2 mg/dL - - -  Alkaline Phos 38 - 126 U/L - - -  AST 15 - 41 U/L - - -  ALT 0 - 44 U/L - - -    CBC Latest Ref Rng & Units 07/10/2019 07/09/2019 07/08/2019  WBC 4.0 - 10.5 K/uL 7.6 8.2 9.5  Hemoglobin 12.0 - 15.0 g/dL 15.2(H) 15.6(H) 16.8(H)  Hematocrit 36.0 - 46.0 % 52.5(H) 52.2(H) 56.1(H)  Platelets 150 - 400 K/uL 190 187 215   Lipid Panel     Component Value Date/Time   TRIG 103 03/31/2019 0235   HEMOGLOBIN A1C Lab Results  Component Value Date   HGBA1C 4.8 07/04/2019   MPG 91 07/04/2019   TSH Recent Labs    03/29/19 2135  TSH 0.615    *** Medications and allergies   Allergies  Allergen Reactions  . Unasyn [Ampicillin-Sulbactam Sodium] Swelling and Rash    angioedema  . Contrast Media [Iodinated Diagnostic Agents] Nausea And Vomiting  . Hydralazine Swelling     Current Outpatient Medications  Medication Instructions  . albuterol (PROVENTIL HFA;VENTOLIN HFA) 108 (90 BASE) MCG/ACT inhaler 2 puffs, Inhalation, Every 6 hours PRN  . albuterol (PROVENTIL) 2.5 mg, Nebulization, Every 6 hours PRN  . ARIPiprazole (ABILIFY) 5 mg, Oral, Daily  . budesonide-formoterol (SYMBICORT) 160-4.5 MCG/ACT inhaler INHALE TWO puffs into THE lungs TWICE DAILY  . buPROPion (WELLBUTRIN SR) 150 mg, Oral, Daily  . diclofenac sodium (VOLTAREN) 2 g, Topical, 4 times daily  . FLUoxetine (PROZAC) 20 mg, Oral, Daily  . furosemide (LASIX) 40 mg, Oral, 2 times daily  . gabapentin (NEURONTIN) 300 mg, Oral, 3 times daily  . levETIRAcetam (KEPPRA) 1,000 mg, Oral, 2 times daily  . losartan (COZAAR) 50 mg, Oral, Daily  . metoprolol succinate (TOPROL-XL) 25 mg, Oral, Daily  . OXYGEN 4 L, Inhalation, Continuous  . QUEtiapine (SEROQUEL) 100 mg, Oral, 2 times daily  . rosuvastatin (CRESTOR) 10 mg, Oral, Daily  . tiZANidine (ZANAFLEX) 4 mg, Oral, 2 times daily PRN  . traZODone (DESYREL) 100 mg, Oral, Daily at bedtime    Radiology:   CT chest 03/17/17: Mild to moderate emphysema. Diffuse subtle ground-glass density, felt to correspond to patient's history of underlying interstitial lung  disease. Right and left heart catheterization 01/04/2018: RA 6/5/4 mmHg; RV 36/3, EDP 7 mmHg; PA 33/23, mean 28 mmHg.  PA saturation 74%.;  PW 6/6, mean 5 mmHg.  Aortic saturation 95%. Cardiac output 4.76, cardiac index 2.52 by Fick.  QP/QS 1.0.  PVR 387 dynes per second.   Left heart catheterization: LV 123/4, EDP 6 mmHg.  Aortic pressure 136/80, mean 102 mmHg.  No LV to aortic pressure gradient. Normal coronary arteries, right dominant circulation.  Echocardiogram 07/09/2017: Normal LV systolic function, EF 37-62%, mildly thickened mitral valve leaflets.  Moderately dilated right ventricle, severe pulmonary hypertension with mild TR.   Cardiac Studies:  Right and left heart catheterization 01/04/2018: RA 6/5/4 mmHg; RV 36/3, EDP 7 mmHg; PA 33/23, mean 28 mmHg.  PA saturation 74%.;  PW 6/6, mean 5 mmHg.  Aortic saturation 95%. Cardiac output 4.76, cardiac index 2.52 by Fick.  QP/QS 1.0.  PVR 387 dynes per second.   Left heart catheterization: LV 123/4, EDP 6 mmHg.  Aortic pressure 136/80, mean 102 mmHg.  No LV to aortic pressure gradient. Normal coronary arteries, right dominant circulation.  Echocardiogram 07/09/2017: Normal LV systolic function, EF 03-70%, mildly thickened mitral valve leaflets.  Moderately dilated right ventricle, severe pulmonary hypertension with mild TR.  Assessment     ICD-10-CM   1. Chronic diastolic (congestive) heart failure (HCC)  I50.32   2. Essential hypertension  I10   3. Chronic respiratory failure with hypoxia (HCC)  J96.11     ***  ***  Recommendations:  No orders of the defined types were placed in this encounter.   Diane Gilbert  is a 54 y.o. with severe COPD and oxygen dependent, hypertension, respiratory bronchiolitis with interstitial lung disease, chronic diastolic heart failure, obstructive sleep apnea noncompliant with CPAP, ongoing tobacco use disorder, ongoing substance use with cocaine is here for f/u of chronic diastolic  CHF.  Unfortunately still continues to smoke and used cocaine as latest as yesterday.  She has not been practicing social distancing.   Adrian Prows, MD, [facility] 07/10/2019, 2:37 PM [facility] [place] Office: [phone]

## 2019-07-10 NOTE — Progress Notes (Signed)
Physical Therapy Treatment Patient Details Name: Diane Gilbert MRN: [id number] DOB: January 16, 1966 Today's Date: 07/10/2019    History of Present Illness 54 yo female smoker presented to ER with chest discomfort and dyspnea.  She has hx of respiratory bronchiolitis with interstitial lung disease, emphysema and chronic hypoxic respiratory failure on 4 liters oxygen. Additional PMHx includes CVA, seizure, CHF EF 60%, schizophrenia, HTN, back surgery, and Lt MCL avulsion with comminuted spiral fx of proximal fibula on 06/19/19.  Found to have acute hypoxia and hypercapnia, and CT chest showed changes of pneumonia.  COVID 19 test negative.     PT Comments    Patient is making progress toward PT goals and tolerated gait distance of 150 ft total with seated rest break. SpO2 90-93% on 4L O2 via [place]. Current plan remains appropriate.    Follow Up Recommendations  Home health PT;Supervision for mobility/OOB     Equipment Recommendations  Rolling walker with 5" wheels;3in1 (PT)    Recommendations for Other Services       Precautions / Restrictions Precautions Precautions: Fall Precaution Comments: has L MCL avulsion/fibular spiral fracture L LE- needs to be in knee brace; watch HR and SpO2 Required Braces or Orthoses: Other Brace Other Brace: knee brace L LE Restrictions Weight Bearing Restrictions: No Other Position/Activity Restrictions: no known WB precautions- from chart, sounds like MCL injury/fibula spiral fracture were diagnosed 12/21 and they sent her home with knee brace/told her OK to walk on it with brace on    Mobility  Bed Mobility Overal bed mobility: Modified Independent Bed Mobility: Supine to Sit           General bed mobility comments: use of rail; increased time  Transfers Overall transfer level: Needs assistance Equipment used: Rolling walker (2 wheeled) Transfers: Sit to/from [facility] Sit to Stand: Min guard Stand pivot transfers:  Supervision       General transfer comment: cues for safe hand placement  Ambulation/Gait Ambulation/Gait assistance: Min guard Gait Distance (Feet): (150 ft with seated rest break) Assistive device: Rolling walker (2 wheeled) Gait Pattern/deviations: Step-through pattern;Decreased stride length Gait velocity: decreased   General Gait Details: slow, steady gait wiht bilat UE support; seated rest break due to fatigue   Stairs             Wheelchair Mobility    Modified Rankin (Stroke Patients Only)       Balance Overall balance assessment: Mild deficits observed, not formally tested;History of Falls                                          Cognition Arousal/Alertness: Awake/alert Behavior During Therapy: WFL for tasks assessed/performed;Flat affect Overall Cognitive Status: Within Functional Limits for tasks assessed                                        Exercises      General Comments General comments (skin integrity, edema, etc.): SpO2 90-93% on 4L O2 via [place]       Pertinent Vitals/Pain      Home Living                      Prior Function            PT Goals (current goals  can now be found in the care plan section) Progress towards PT goals: Progressing toward goals    Frequency    Min 3X/week      PT Plan Current plan remains appropriate    Co-evaluation              AM-PAC PT "6 Clicks" Mobility   Outcome Measure  Help needed turning from your back to your side while in a flat bed without using bedrails?: None Help needed moving from lying on your back to sitting on the side of a flat bed without using bedrails?: None Help needed moving to and from a bed to a chair (including a wheelchair)?: None Help needed standing up from a chair using your arms (e.g., wheelchair or bedside chair)?: A Little Help needed to walk in hospital room?: None Help needed climbing 3-5 steps with a railing? :  A Little 6 Click Score: 22    End of Session Equipment Utilized During Treatment: Gait belt;Oxygen Activity Tolerance: Patient tolerated treatment well Patient left: with call bell/phone within reach;in chair Nurse Communication: Mobility status PT Visit Diagnosis: Difficulty in walking, not elsewhere classified (R26.2);Muscle weakness (generalized) (M62.81);History of falling (Z91.81)     Time: 0347-4259 PT Time Calculation (min) (ACUTE ONLY): 36 min  Charges:  $Gait Training: 23-37 mins                     Earney Navy, PTA Acute Rehabilitation Services Pager: [phone] Office: [phone]     [name] 07/10/2019, 1:13 PM

## 2019-07-10 NOTE — TOC Progression Note (Signed)
Transition of Care [facility]) - Progression Note    Patient Details  Name: Diane Gilbert MRN: [id number] Date of Birth: 1966-06-21  Transition of Care [facility]) CM/SW Contact  Zenon Mayo, RN Phone Number: 07/10/2019, 10:23 AM  Clinical Narrative:    Patient is for dc today, NCM offered choice, she states she has no preference for which [facility] agency she uses.  NCM made referral to [facility] for HHPT/HHOT, soc will begin on Friday. She is also set up with NIV with [employer], her friend will transport her home today and will be there while [employer] is going over the NIV with patient.  The 3 n 1, and rollator will be brought up to patient's room prior to dc.      Barriers to Discharge: No Barriers Identified  Expected Discharge Plan and Services                           DME Arranged: NIV, Walker rolling with seat, 3-N-1 DME Agency: [employer] Date DME Agency Contacted: 07/10/19 Time DME Agency Contacted: 93 Representative spoke with at DME Agency: zack HH Arranged: PT, OT HH Agency: [facility] Date [place]: 07/10/19 Time [place]: 61 Representative spoke with at [place]: brittany   Social Determinants of Health ([place]) Interventions    Readmission Risk Interventions No flowsheet data found.

## 2019-07-10 NOTE — Progress Notes (Signed)
SATURATION QUALIFICATIONS: (This note is used to comply with regulatory documentation for home oxygen)  Patient Saturations on Room Air at Rest = 90%  Patient Saturations on Room Air while Ambulating = 86%  Patient Saturations on 4 Liters of oxygen while Ambulating = 93%  Please briefly explain why patient needs home oxygen: Pt desats on ambulation

## 2019-07-11 ENCOUNTER — Inpatient Hospital Stay (HOSPITAL_COMMUNITY): Admission: RE | Admit: 2019-07-11 | Payer: Medicaid Other | Source: Ambulatory Visit

## 2019-07-11 LAB — GLUCOSE, CAPILLARY
Glucose-Capillary: 125 mg/dL — ABNORMAL HIGH (ref 70–99)
Glucose-Capillary: 128 mg/dL — ABNORMAL HIGH (ref 70–99)
Glucose-Capillary: 238 mg/dL — ABNORMAL HIGH (ref 70–99)
Glucose-Capillary: 186 mg/dL — ABNORMAL HIGH (ref 70–99)
Glucose-Capillary: 196 mg/dL — ABNORMAL HIGH (ref 70–99)
Glucose-Capillary: 136 mg/dL — ABNORMAL HIGH (ref 70–99)

## 2019-07-11 LAB — BASIC METABOLIC PANEL
Anion gap: 12 (ref 5–15)
BUN: 12 mg/dL (ref 6–20)
CO2: 35 mmol/L — ABNORMAL HIGH (ref 22–32)
Calcium: 9.4 mg/dL (ref 8.9–10.3)
Chloride: 93 mmol/L — ABNORMAL LOW (ref 98–111)
Creatinine, Ser: 0.73 mg/dL (ref 0.44–1.00)
GFR calc Af Amer: >60 mL/min (ref >60)
GFR calc non Af Amer: >60 mL/min (ref >60)
Glucose, Bld: 105 mg/dL — ABNORMAL HIGH (ref 70–99)
Potassium: 4 mmol/L (ref 3.5–5.1)
Sodium: 140 mmol/L (ref 135–145)

## 2019-07-11 MED ORDER — IPRATROPIUM-ALBUTEROL 0.5-2.5 (3) MG/3ML IN SOLN
3.0000 mL | Freq: Two times a day (BID) | RESPIRATORY_TRACT | Status: DC
  Administered 2019-07-11 – 2019-07-12 (×2): 3 mL via RESPIRATORY_TRACT
  Filled 2019-07-11 (×2): qty 3

## 2019-07-11 NOTE — Progress Notes (Signed)
PROGRESS NOTE    Diane Gilbert  NTI:[id number] DOB: 1965/12/09 DOA: 07/02/2019 PCP: Nolene Ebbs, MD   Brief Narrative:  54 yo female smoker presented to ER with chest discomfort and dyspnea.  She has hx of respiratory bronchiolitis with interstitial lung disease (RB ILD) on prednisone 10 mg daily, emphysema and chronic hypoxic respiratory failure on 4 liters oxygen.  She was found to have Lt MCL avulsion, and superimposed comminuted spiral fx of proximal fibula on 06/19/19.  Found to have acute hypoxia and hypercapnia, and CT chest showed changes of pneumonia.  COVID 19 test negative.  Started on Bipap, ABx, steroids.  PCCM asked to admit to ICU.  Assessment & Plan:   Principal Problem:   Acute on chronic respiratory failure with hypoxia and hypercapnia (HCC) Active Problems:   Acute on chronic diastolic heart failure (HCC)   Hypokalemia   Schizophrenia (HCC)   Chronic respiratory failure with hypoxia ([place])   Community acquired pneumonia   Acute respiratory failure with hypoxia (HCC)   Acute respiratory failure with hypoxia and hypercapnia (HCC)   History of CVA (cerebrovascular accident)   Seizure disorder ([place])   Acute metabolic encephalopathy   ARF (acute renal failure) (HCC)  1 acute on chronic hypoxemic hypercapnic respiratory failure likely secondary to multilobar pneumonia and acute on chronic diastolic heart failure Patient with a history of respiratory bronchiolitis with interstitial lung disease secondary to smoking per pulmonary.  Patient also with morbid obesity with obesity hypoventilation syndrome and hypercarbic failure.  Patient noted to have had a negative outpatient sleep study.  Patient improving clinically and with sats of 98% on 3 L nasal cannula.  Patient also per PT with sats dropping into the 80s on ambulation early on in the hospitalization..  Patient on 4 L home O2.  Urine strep pneumococcus antigen was positive.  Chest x-ray worrisome for pneumonia  versus bilateral CHF.  CT angiogram chest negative for PE however this show right middle and right lower lobe infiltrate and probable left lower lobe infiltrate.  Patient with a urine output of 3.025 L over the past 24 hours.  Patient is - 21.968 L during this hospitalization.  Patient was on IV antibiotics and has subsequently been transitioned to oral Levaquin.  Continue Pulmicort, Flonase, Claritin, PPI, scheduled duo nebs, steroid taper as recommended by pulmonary.  Per pulmonary patient will benefit from NIPPV/BiPAP therapy at discharge which pulmonary is arranging.  Continue supportive care.  Follow.  2.  Multilobar pneumonia Noted on CT chest.  Patient presented with worsening shortness of breath and increased O2 requirements with acute on chronic hypoxemic and hypercarbic respiratory failure.  Urine strep pneumococcus antigen was positive.  Urine Legionella antigen negative.  Patient currently afebrile.  Patient was on IV antibiotics and has subsequently been transitioned to oral Levaquin to complete course of antibiotic treatment.  See #1.  3.  Acute on chronic diastolic heart failure Patient presented with increased O2 requirements from baseline, chest x-ray concerning for volume overload plus or minus pneumonia.  Patient was placed on IV Lasix and received a dose of metolazone.  Patient was on oral Lasix.  Patient with a urine output of 3.025 L over the past 24 hours.  Patient is -21.968 L during this hospitalization.  Current weight is 101.5 kg from 105.8 kg from 112.5 kg from 108.4 kg from 110 kg(07/05/19) from 108.2 kg on admission.  Patient seems to be auto diuresing.  Patient with a bump in creatinine early on, and as such  Lasix and Cozaar were discontinued.  Resumed Toprol-XL as blood pressure has improved.  Saline lock IV fluids.  Continue to hold diuretics as patient auto diuresing.  Likely resume diuretics in the next 1 to 2 days or on discharge.  Follow.  4.  CVA with seizure  disorder Continue Keppra and Crestor, aspirin.  PT/OT.   5.  Acute metabolic encephalopathy Patient more alert this morning and following commands appropriately.  Patient states slept well after receiving Seroquel and trazodone.  Patient able to tolerate BiPAP overnight.  Prozac and Wellbutrin and Abilify have been resumed.  Continue treatment as in #1, 2 and 3.  Continue to hold Neurontin.  Supportive care.   6.  History of schizophrenia Patient alert this morning.  Following commands.  Patient's Wellbutrin, Prozac, Seroquel and trazodone as well as Abilify have been resumed.  Continue to hold Neurontin.   7.  Left MCL avulsion, superimposed community spiral fracture of the proximal tibial 06/19/2019 Lower extremity Dopplers negative for DVT.  Outpatient follow-up with orthopedics.  8.  Hypokalemia Secondary to diuresis.  Repleted.  Potassium of 4.   9.  Acute renal failure Likely secondary to a prerenal azotemia secondary to overdiuresis.  Patient noted to have systolic blood pressures in the 80s on 07/08/2019.  Diuretics and ARB have been discontinued.  Renal function improved with hydration and creatinine at 0.73.  Patient with urine output of 3.025 L.  IV fluids have been saline locked.  Would likely resume half home dose Lasix tomorrow.  Follow.  10.  Hypotension Likely secondary to overdiuresis.  Patient received all antihypertensive medications 2 days ago..  Patient's Lasix and Cozaar discontinued due to acute renal failure.  Patient received a bolus of normal saline on 07/08/2019.  Renal function improved.  Blood pressure has improved.  IV fluids have been saline locked.  Beta-blocker has been resumed.  Follow.    DVT prophylaxis: Heparin Code Status: Full Family Communication: Updated patient.  No family at bedside. Disposition Plan: Likely home with home health when clinically improved, no longer hypotensive hopefully tomorrow.     Consultants:   None  Procedures:   CT  angiogram chest 07/03/2019  Chest x-ray 07/02/2019, 07/04/2019  Lower extremity Dopplers 07/03/2019  Antimicrobials: Rocephin 1/04 Zithromax 1/04 x1 Vancomycin 1/04 >>>> 07/04/2019 Cefepime 1/04 >>>> 07/04/2019 Ceftriaxone 1/04 > 1/06  Oral Levaquin 07/06/2019   Micro Data:  SARS CoV2 PCR 1/04 >> negative Influenza PCR 1/04 >> A and B negative Pneumococcal Ag 1/04 >> positive Legionella Ag 1/04 >> negative  Blood 1/04 >> NGTD   Subjective: Patient sitting up in chair.  Alert.  States dizziness has improved.  No chest pain.  No significant shortness of breath.  Objective: Vitals:   07/11/19 0100 07/11/19 0500 07/11/19 0738 07/11/19 0823  BP: 131/69 112/62  (!) 116/50  Pulse: 78 66  88  Resp:  17  19  Temp: 98.9 F (37.2 C) 99 F (37.2 C)  (!) 97.5 F (36.4 C)  TempSrc: Oral Oral  Oral  SpO2: 94% 96% 98% 98%  Weight:  101.5 kg    Height:        Intake/Output Summary (Last 24 hours) at 07/11/2019 1058 Last data filed at 07/11/2019 0900 Gross per 24 hour  Intake 916 ml  Output 3025 ml  Net -2109 ml   Filed Weights   07/09/19 1838 07/10/19 0254 07/11/19 0500  Weight: 102.3 kg 105.8 kg 101.5 kg    Examination:  General exam: NAD Respiratory  system: Minimal bibasilar crackles.  No wheezing.  Speaking in full sentences.  Normal respiratory effort.  Cardiovascular system: Regular rate and rhythm no murmurs rubs or gallops.  No JVD.  No lower extremity edema.   Gastrointestinal system: Abdomen is soft, nontender, nondistended, positive bowel sounds.  No rebound.  No guarding.  Central nervous system: Alert and oriented. No focal neurological deficits. Extremities: Symmetric 5 x 5 power. Skin: No rashes, lesions or ulcers Psychiatry: Judgement and insight appear normal. Mood & affect appropriate.     Data Reviewed: I have personally reviewed following labs and imaging studies  CBC: Recent Labs  Lab 07/06/19 0440 07/07/19 0209 07/08/19 0853 07/09/19 0357 07/10/19 0451   WBC 11.9* 10.7* 9.5 8.2 7.6  NEUTROABS  --  6.3  --   --   --   HGB 17.2* 17.6* 16.8* 15.6* 15.2*  HCT 57.8* 59.0* 56.1* 52.2* 52.5*  MCV 92.2 92.2 91.4 91.9 94.1  PLT 258 262 215 187 161   Basic Metabolic Panel: Recent Labs  Lab 07/05/19 0120 07/06/19 0440 07/07/19 0209 07/08/19 0853 07/09/19 0357 07/10/19 0451 07/11/19 0602  NA 136 138 136 130* 135 137 140  K 3.7 3.5 3.5 3.1* 3.7 4.1 4.0  CL 82* 81* 82* 80* 90* 94* 93*  CO2 41* 44* 39* 38* 35* 32 35*  GLUCOSE 180* 117* 128* 184* 138* 170* 105*  BUN 17 17 30* 47* 46* 22* 12  CREATININE 0.74 0.76 0.95 1.98* 1.20* 0.73 0.73  CALCIUM 8.9 9.3 9.3 8.5* 8.6* 9.2 9.4  MG 2.1 2.1 2.3  --   --   --   --   PHOS 1.9* 3.0  --   --   --   --   --    GFR: Estimated Creatinine Clearance: 100.1 mL/min (by C-G formula based on SCr of 0.73 mg/dL). Liver Function Tests: No results for input(s): AST, ALT, ALKPHOS, BILITOT, PROT, ALBUMIN in the last 168 hours. No results for input(s): LIPASE, AMYLASE in the last 168 hours. No results for input(s): AMMONIA in the last 168 hours. Coagulation Profile: No results for input(s): INR, PROTIME in the last 168 hours. Cardiac Enzymes: No results for input(s): CKTOTAL, CKMB, CKMBINDEX, TROPONINI in the last 168 hours. BNP (last 3 results) No results for input(s): PROBNP in the last 8760 hours. HbA1C: No results for input(s): HGBA1C in the last 72 hours. CBG: Recent Labs  Lab 07/10/19 1620 07/10/19 2139 07/11/19 0112 07/11/19 0448 07/11/19 0745  GLUCAP 298* 152* 125* 128* 238*   Lipid Profile: No results for input(s): CHOL, HDL, LDLCALC, TRIG, CHOLHDL, LDLDIRECT in the last 72 hours. Thyroid Function Tests: No results for input(s): TSH, T4TOTAL, FREET4, T3FREE, THYROIDAB in the last 72 hours. Anemia Panel: No results for input(s): VITAMINB12, FOLATE, FERRITIN, TIBC, IRON, RETICCTPCT in the last 72 hours. Sepsis Labs: No results for input(s): PROCALCITON, LATICACIDVEN in the last 168  hours.  Recent Results (from the past 240 hour(s))  Blood culture (routine x 2)     Status: None   Collection Time: 07/03/19  9:00 AM   Specimen: BLOOD RIGHT HAND  Result Value Ref Range Status   Specimen Description BLOOD RIGHT HAND  Final   Special Requests   Final    BOTTLES DRAWN AEROBIC AND ANAEROBIC Blood Culture results may not be optimal due to an inadequate volume of blood received in culture bottles   Culture   Final    NO GROWTH 5 DAYS Performed at [facility], [address] [place], [place] [address]    Report Status 07/08/2019 FINAL  Final  Respiratory Panel by RT PCR (Flu A&B, Covid) - Nasopharyngeal Swab     Status: None   Collection Time: 07/03/19  9:15 AM   Specimen: Nasopharyngeal Swab  Result Value Ref Range Status   SARS Coronavirus 2 by RT PCR NEGATIVE NEGATIVE Final    Comment: (NOTE) SARS-CoV-2 target nucleic acids are NOT DETECTED. The SARS-CoV-2 RNA is generally detectable in upper respiratoy specimens during the acute phase of infection. The lowest concentration of SARS-CoV-2 viral copies this assay can detect is 131 copies/mL. A negative result does not preclude SARS-Cov-2 infection and should not be used as the sole basis for treatment or other patient management decisions. A negative result may occur with  improper specimen collection/handling, submission of specimen other than nasopharyngeal swab, presence of viral mutation(s) within the areas targeted by this assay, and inadequate number of viral copies (<131 copies/mL). A negative result must be combined with clinical observations, patient history, and epidemiological information. The expected result is Negative. Fact Sheet for Patients:  PinkCheek.be Fact Sheet for Healthcare Providers:  GravelBags.it This test is not yet ap proved or cleared by the [place] FDA and  has been authorized for detection and/or diagnosis of  SARS-CoV-2 by FDA under an Emergency Use Authorization (EUA). This EUA will remain  in effect (meaning this test can be used) for the duration of the COVID-19 declaration under Section 564(b)(1) of the Act, 21 U.S.C. section 360bbb-3(b)(1), unless the authorization is terminated or revoked sooner.    Influenza A by PCR NEGATIVE NEGATIVE Final   Influenza B by PCR NEGATIVE NEGATIVE Final    Comment: (NOTE) The Xpert Xpress SARS-CoV-2/FLU/RSV assay is intended as an aid in  the diagnosis of influenza from Nasopharyngeal swab specimens and  should not be used as a sole basis for treatment. Nasal washings and  aspirates are unacceptable for Xpert Xpress SARS-CoV-2/FLU/RSV  testing. Fact Sheet for Patients: PinkCheek.be Fact Sheet for Healthcare Providers: GravelBags.it This test is not yet approved or cleared by the [place] FDA and  has been authorized for detection and/or diagnosis of SARS-CoV-2 by  FDA under an Emergency Use Authorization (EUA). This EUA will remain  in effect (meaning this test can be used) for the duration of the  Covid-19 declaration under Section 564(b)(1) of the Act, 21  U.S.C. section 360bbb-3(b)(1), unless the authorization is  terminated or revoked. Performed at [facility], [place] [address], [place], [place] [address]   Blood culture (routine x 2)     Status: None   Collection Time: 07/03/19  9:15 AM   Specimen: BLOOD  Result Value Ref Range Status   Specimen Description BLOOD LEFT ANTECUBITAL  Final   Special Requests   Final    BOTTLES DRAWN AEROBIC AND ANAEROBIC Blood Culture results may not be optimal due to an inadequate volume of blood received in culture bottles   Culture   Final    NO GROWTH 5 DAYS Performed at [facility], [place] [address], [place], [place] [address]    Report Status 07/08/2019 FINAL  Final  MRSA PCR Screening     Status: None   Collection Time: 07/04/19  12:46 AM   Specimen: Nasopharyngeal  Result Value Ref Range Status   MRSA by PCR NEGATIVE NEGATIVE Final    Comment:        The GeneXpert MRSA Assay (FDA approved for NASAL specimens only),  is one component of a comprehensive MRSA colonization surveillance program. It is not intended to diagnose MRSA infection nor to guide or monitor treatment for MRSA infections. Performed at [facility], [place] [address], [place], [place] [address]          Radiology Studies: No results found.      Scheduled Meds:  ARIPiprazole  5 mg Oral Daily   aspirin  81 mg Oral Daily   budesonide (PULMICORT) nebulizer solution  0.5 mg Nebulization BID   buPROPion  150 mg Oral Daily   chlorhexidine  15 mL Mouth Rinse BID   Chlorhexidine Gluconate Cloth  6 each Topical Daily   FLUoxetine  20 mg Oral Daily   fluticasone  2 spray Each Nare Daily   heparin  5,000 Units Subcutaneous Q8H   insulin aspart  0-20 Units Subcutaneous Q4H   ipratropium-albuterol  3 mL Nebulization BID   levETIRAcetam  1,000 mg Oral BID   levofloxacin  750 mg Oral Daily   loratadine  10 mg Oral Daily   mouth rinse  15 mL Mouth Rinse q12n4p   metoprolol succinate  25 mg Oral Daily   pantoprazole  40 mg Oral Q0600   [START ON 07/12/2019] predniSONE  10 mg Oral Q breakfast   QUEtiapine  100 mg Oral BID   rosuvastatin  10 mg Oral Daily   Continuous Infusions:  sodium chloride 10 mL/hr at 07/06/19 0608     LOS: 8 days    Time spent: 40 minutes    Irine Seal, MD [facility]  If 7PM-7AM, please contact night-coverage [email or web address] 07/11/2019, 10:58 AM

## 2019-07-11 NOTE — Progress Notes (Signed)
Pt currently off BiPAP, BiPAP is on stand by at this time, pt on 3L [place] tolerating well. RT will continue to monitor.

## 2019-07-12 DIAGNOSIS — J9622 Acute and chronic respiratory failure with hypercapnia: Secondary | ICD-10-CM

## 2019-07-12 DIAGNOSIS — J9621 Acute and chronic respiratory failure with hypoxia: Secondary | ICD-10-CM

## 2019-07-12 LAB — BASIC METABOLIC PANEL
Anion gap: 10 (ref 5–15)
BUN: 11 mg/dL (ref 6–20)
CO2: 34 mmol/L — ABNORMAL HIGH (ref 22–32)
Calcium: 9.1 mg/dL (ref 8.9–10.3)
Chloride: 95 mmol/L — ABNORMAL LOW (ref 98–111)
Creatinine, Ser: 0.68 mg/dL (ref 0.44–1.00)
GFR calc Af Amer: >60 mL/min (ref >60)
GFR calc non Af Amer: >60 mL/min (ref >60)
Glucose, Bld: 95 mg/dL (ref 70–99)
Potassium: 4.4 mmol/L (ref 3.5–5.1)
Sodium: 139 mmol/L (ref 135–145)

## 2019-07-12 LAB — CBC
HCT: 50.6 % — ABNORMAL HIGH (ref 36.0–46.0)
Hemoglobin: 14.8 g/dL (ref 12.0–15.0)
MCH: 27.7 pg (ref 26.0–34.0)
MCHC: 29.2 g/dL — ABNORMAL LOW (ref 30.0–36.0)
MCV: 94.6 fL (ref 80.0–100.0)
Platelets: 173 10*3/uL (ref 150–400)
RBC: 5.35 MIL/uL — ABNORMAL HIGH (ref 3.87–5.11)
RDW: 13.8 % (ref 11.5–15.5)
WBC: 9.7 10*3/uL (ref 4.0–10.5)
nRBC: 0 % (ref 0.0–0.2)

## 2019-07-12 LAB — GLUCOSE, CAPILLARY
Glucose-Capillary: 111 mg/dL — ABNORMAL HIGH (ref 70–99)
Glucose-Capillary: 122 mg/dL — ABNORMAL HIGH (ref 70–99)
Glucose-Capillary: 135 mg/dL — ABNORMAL HIGH (ref 70–99)

## 2019-07-12 MED ORDER — ASPIRIN 81 MG PO CHEW: 81.0000 mg | CHEWABLE_TABLET | Freq: Every day | ORAL | 0 refills | Status: AC

## 2019-07-12 MED ORDER — LEVOFLOXACIN 750 MG PO TABS: 750.0000 mg | ORAL_TABLET | Freq: Every day | ORAL | 0 refills | Status: AC

## 2019-07-12 NOTE — Progress Notes (Signed)
Discharge education and medication education given to patient  with teach back. All questions and concerns answered.  Peripheral IV and telemetry leads removed. All patient belongings given to patient. Patient transported to main entrance by nurse and nurse tech via wheelchair.

## 2019-07-12 NOTE — Discharge Instructions (Signed)
Follow with Nolene Ebbs, MD in 5-7 days  Please get a complete blood count and chemistry panel checked by your Primary MD at your next visit, and again as instructed by your Primary MD. Please get your medications reviewed and adjusted by your Primary MD.  Please request your Primary MD to go over all Hospital Tests and Procedure/Radiological results at the follow up, please get all Hospital records sent to your Prim MD by signing hospital release before you go home.  In some cases, there will be blood work, cultures and biopsy results pending at the time of your discharge. Please request that your primary care M.D. goes through all the records of your hospital data and follows up on these results.  If you had Pneumonia of Lung problems at the Hospital: Please get a 2 view Chest X ray done in 6-8 weeks after hospital discharge or sooner if instructed by your Primary MD.  If you have Congestive Heart Failure: Please call your Cardiologist or Primary MD anytime you have any of the following symptoms:  1) 3 pound weight gain in 24 hours or 5 pounds in 1 week  2) shortness of breath, with or without a dry hacking cough  3) swelling in the hands, feet or stomach  4) if you have to sleep on extra pillows at night in order to breathe  Follow cardiac low salt diet and 1.5 lit/day fluid restriction.  If you have diabetes Accuchecks 4 times/day, Once in AM empty stomach and then before each meal. Log in all results and show them to your primary doctor at your next visit. If any glucose reading is under 80 or above 300 call your primary MD immediately.  If you have Seizure/Convulsions/Epilepsy: Please do not drive, operate heavy machinery, participate in activities at heights or participate in high speed sports until you have seen by Primary MD or a Neurologist and advised to do so again. Per [facility] statutes, patients with seizures are not allowed to drive until they have been  seizure-free for six months.  Use caution when using heavy equipment or power tools. Avoid working on ladders or at heights. Take showers instead of baths. Ensure the water temperature is not too high on the home water heater. Do not go swimming alone. Do not lock yourself in a room alone (i.e. bathroom). When caring for infants or small children, sit down when holding, feeding, or changing them to minimize risk of injury to the child in the event you have a seizure. Maintain good sleep hygiene. Avoid alcohol.   If you had Gastrointestinal Bleeding: Please ask your Primary MD to check a complete blood count within one week of discharge or at your next visit. Your endoscopic/colonoscopic biopsies that are pending at the time of discharge, will also need to followed by your Primary MD.  Get Medicines reviewed and adjusted. Please take all your medications with you for your next visit with your Primary MD  Please request your Primary MD to go over all hospital tests and procedure/radiological results at the follow up, please ask your Primary MD to get all Hospital records sent to his/her office.  If you experience worsening of your admission symptoms, develop shortness of breath, life threatening emergency, suicidal or homicidal thoughts you must seek medical attention immediately by calling 911 or calling your MD immediately  if symptoms less severe.  You must read complete instructions/literature along with all the possible adverse reactions/side effects for all the Medicines you take  and that have been prescribed to you. Take any new Medicines after you have completely understood and accpet all the possible adverse reactions/side effects.   Do not drive or operate heavy machinery when taking Pain medications.   Do not take more than prescribed Pain, Sleep and Anxiety Medications  Special Instructions: If you have smoked or chewed Tobacco  in the last 2 yrs please stop smoking, stop any regular  Alcohol  and or any Recreational drug use.  Wear Seat belts while driving.  Please note You were cared for by a hospitalist during your hospital stay. If you have any questions about your discharge medications or the care you received while you were in the hospital after you are discharged, you can call the unit and asked to speak with the hospitalist on call if the hospitalist that took care of you is not available. Once you are discharged, your primary care physician will handle any further medical issues. Please note that NO REFILLS for any discharge medications will be authorized once you are discharged, as it is imperative that you return to your primary care physician (or establish a relationship with a primary care physician if you do not have one) for your aftercare needs so that they can reassess your need for medications and monitor your lab values.  You can reach the hospitalist office at phone [phone] or fax [phone]   If you do not have a primary care physician, you can call [phone] for a physician referral.  Activity: As tolerated with Full fall precautions use walker/cane & assistance as needed    Diet: low sodium  Disposition Home

## 2019-07-12 NOTE — Discharge Summary (Signed)
Physician Discharge Summary  Diane Gilbert IBB:[id number] DOB: [birth year] DOA: 07/02/2019  PCP: Nolene Ebbs, MD  Admit date: 07/02/2019 Discharge date: 07/12/2019  Admitted From: home Disposition:  home  Recommendations for Outpatient Follow-up:  1. Follow up with PCP in 1-2 weeks 2. Please obtain BMP/CBC in one week  Home Health: PT Equipment/Devices: walking boot  Discharge Condition: stable CODE STATUS: Full code Diet recommendation: low sodium  HPI: Per admitting MD, 54 yo female smoker presented to ER with chest discomfort and dyspnea.  She has hx of respiratory bronchiolitis with interstitial lung disease (RB ILD) on prednisone 10 mg daily, emphysema and chronic hypoxic respiratory failure on 4 liters oxygen.  She was found to have Lt MCL avulsion, and superimposed comminuted spiral fx of proximal fibula on 06/19/19.  Found to have acute hypoxia and hypercapnia, and CT chest showed changes of pneumonia.  COVID 19 test negative.  Started on Bipap, ABx, steroids.  PCCM asked to admit to ICU.  Hospital Course / Discharge diagnoses: Acute on chronic hypoxemic hypercapnic respiratory failure likely secondary to multilobar pneumonia and acute on chronic diastolic heart failure -Patient with a history of respiratory bronchiolitis with interstitial lung disease secondary to smoking per pulmonary.  Patient also with morbid obesity with obesity hypoventilation syndrome and hypercarbic failure.  Patient noted to have had a negative outpatient sleep study.  Patient improving clinically and with sats of 98% on 3 L nasal cannula which is her baseline. Urine strep pneumococcus antigen was positive.  Chest x-ray worrisome for pneumonia versus bilateral CHF.  CT angiogram chest negative for PE however this show right middle and right lower lobe infiltrate and probable left lower lobe infiltrate. Patient is - 24.7 L during this hospitalization.  Patient was on IV antibiotics and has  subsequently been transitioned to oral Levaquin. Per pulmonary patient will benefit from NIPPV/BiPAP therapy at discharge which pulmonary is arranging.  Multilobar pneumonia -Noted on CT chest.  Patient presented with worsening shortness of breath and increased O2 requirements with acute on chronic hypoxemic and hypercarbic respiratory failure.  Urine strep pneumococcus antigen was positive.  Urine Legionella antigen negative.  Patient currently afebrile.  Patient was on IV antibiotics and has subsequently been transitioned to oral Levaquin to complete course of antibiotic treatment.  See #1. Acute on chronic diastolic heart failure -Patient presented with increased O2 requirements from baseline, chest x-ray concerning for volume overload plus or minus pneumonia.Diuresed well as above. Patient with a bump in creatinine early on but now resolved CVA with seizure disorder Continue Keppra and Crestor, aspirin. Acute metabolic encephalopathy - resolved History of schizophrenia- continue home medications Left MCL avulsion, superimposed community spiral fracture of the proximal tibial 06/19/2019 -Lower extremity Dopplers negative for DVT.  Outpatient follow-up with orthopedics. AKI  -Likely secondary to a prerenal azotemia secondary to overdiuresis. Resolved. Hold ARB on d/c and follow up with PCP when it could probably be resumed Hypotension - Likely secondary to overdiuresis. Now resolved, hold ARB on dc as above  Discharge Instructions   Allergies as of 07/12/2019      Reactions   Unasyn [ampicillin-sulbactam Sodium] Swelling, Rash   angioedema   Contrast Media [iodinated Diagnostic Agents] Nausea And Vomiting   Hydralazine Swelling      Medication List    STOP taking these medications   losartan 50 MG tablet Commonly known as: COZAAR     TAKE these medications   albuterol 108 (90 Base) MCG/ACT inhaler Commonly known as: VENTOLIN HFA Inhale  2 puffs into the lungs every 6 (six) hours as  needed for wheezing or shortness of breath.   albuterol (2.5 MG/3ML) 0.083% nebulizer solution Commonly known as: PROVENTIL Take 3 mLs (2.5 mg total) by nebulization every 6 (six) hours as needed for wheezing or shortness of breath.   ARIPiprazole 5 MG tablet Commonly known as: ABILIFY Take 5 mg by mouth daily.   aspirin 81 MG chewable tablet Chew 1 tablet (81 mg total) by mouth daily.   budesonide-formoterol 160-4.5 MCG/ACT inhaler Commonly known as: Symbicort INHALE TWO puffs into THE lungs TWICE DAILY What changed:   how much to take  how to take this  when to take this   buPROPion 150 MG 12 hr tablet Commonly known as: WELLBUTRIN SR Take 150 mg by mouth daily.   diclofenac sodium 1 % Gel Commonly known as: VOLTAREN Apply 2 g topically 4 (four) times daily.   FLUoxetine 20 MG capsule Commonly known as: PROZAC Take 20 mg by mouth daily.   furosemide 40 MG tablet Commonly known as: LASIX Take 1 tablet (40 mg total) by mouth 2 (two) times daily.   gabapentin 300 MG capsule Commonly known as: NEURONTIN Take 300 mg by mouth 3 (three) times daily.   levETIRAcetam 1000 MG tablet Commonly known as: KEPPRA Take 1 tablet (1,000 mg total) by mouth 2 (two) times daily.   levofloxacin 750 MG tablet Commonly known as: LEVAQUIN Take 1 tablet (750 mg total) by mouth daily.   metoprolol succinate 25 MG 24 hr tablet Commonly known as: TOPROL-XL Take 25 mg by mouth daily.   OXYGEN Inhale 4 L into the lungs continuous.   QUEtiapine 100 MG tablet Commonly known as: SEROQUEL Take 1 tablet (100 mg total) by mouth 2 (two) times daily.   rosuvastatin 10 MG tablet Commonly known as: CRESTOR Take 1 tablet (10 mg total) by mouth daily.   tiZANidine 4 MG capsule Commonly known as: ZANAFLEX Take 4 mg by mouth 2 (two) times daily as needed for muscle spasms.   traZODone 100 MG tablet Commonly known as: DESYREL Take 100 mg by mouth at bedtime.            Durable  Medical Equipment  (From admission, onward)         Start     Ordered   07/10/19 1600  For home use only DME oxygen  Once    Question Answer Comment  Length of Need Lifetime   Mode or (Route) Nasal cannula   Liters per Minute 4   Frequency Continuous (stationary and portable oxygen unit needed)   Oxygen conserving device Yes   Oxygen delivery system Gas      07/10/19 1600   07/10/19 0959  For home use only DME 4 wheeled rolling walker with seat  Once    Question:  Patient needs a walker to treat with the following condition  Answer:  Weakness   07/10/19 0958   07/08/19 0741  For home use only DME Walker rolling  Once    Question Answer Comment  Walker: With [place]   Patient needs a walker to treat with the following condition Debility      07/08/19 0740   07/08/19 0741  For home use only DME 3 n 1  Once     07/08/19 0740   07/06/19 1151  For home use only DME Bipap  Once    Comments: Home NIV therapy for OHS and chronic hypercapnic respiratory failure  Question:  Length of Need  Answer:  Lifetime   07/06/19 1151         Follow-up Information    Icard, Bradley L, DO Follow up on 07/26/2019.   Specialty: Pulmonary Disease Why: F/U with Dr. Valeta Harms 01/272021 to go over PFT results and follow up from [place] information: [place] [address] [phone]        [facility] Follow up.   Why: NIV, 3 n 1 , rollator       [facility] Follow up.   Specialty: Home Health Services Why: HHPT,HHOT Contact information: [place] [address] [phone]        Nolene Ebbs, MD. Go on 07/25/2019.   Specialty: Internal Medicine Why: _0 :45am with Televisit  Contact information: [address] [place] [address] [phone]           Consultations:    Procedures/Studies:  DG Chest 2 View  Result Date: 07/02/2019 CLINICAL DATA:  Chest pain EXAM:  CHEST - 2 VIEW COMPARISON:  03/31/2019 FINDINGS: Moderate cardiomegaly, stable. There is pulmonary vascular congestion with diffuse bilateral interstitial opacities. No pleural effusion or pneumothorax. IMPRESSION: Findings suggestive of CHF with interstitial edema. Electronically Signed   By: Davina Poke D.O.   On: 07/02/2019 17:55   DG Tibia/Fibula Left  Result Date: 06/19/2019 CLINICAL DATA:  54 year old female status post fall 2 days ago with continued pain. EXAM: LEFT TIBIA AND FIBULA - 2 VIEW COMPARISON:  Left ankle series today. FINDINGS: AP and cross-table lateral views. There is a mildly comminuted spiral fracture of the proximal left fibula extending from the fibular head into the proximal shaft. Slight anterior displacement. The tibia appears intact. The mid and distal fibula appear intact. Alignment at the ankle appears preserved. Alignment at the knee appears preserved although there is a medial distal femoral metadiaphysis bone fragment. See left knee series. IMPRESSION: 1. Comminuted spiral fracture of the proximal left fibula. Slight anterior displacement. 2. Medial distal femur bone fragment, see left knee series reported separately. Electronically Signed   By: Genevie Ann M.D.   On: 06/19/2019 11:32   DG Ankle Complete Left  Result Date: 06/19/2019 CLINICAL DATA:  54 year old female status post fall 2 days ago with continued pain. EXAM: LEFT ANKLE COMPLETE - 3+ VIEW COMPARISON:  None. FINDINGS: Bone mineralization is within normal limits. Preserved mortise joint alignment. No joint effusion. Talar dome intact. Distal tibia and fibula appear intact. Calcaneus and other visible bones of the left foot appear intact. There is anterior soft tissue swelling suspected on the cross-table lateral view. IMPRESSION: Anterior soft tissue swelling with no acute fracture or dislocation identified about the left ankle. Electronically Signed   By: Genevie Ann M.D.   On: 06/19/2019 11:28   CT Angio Chest  PE W and/or Wo Contrast  Result Date: 07/03/2019 CLINICAL DATA:  Shortness of breath, severe hypoxia EXAM: CT ANGIOGRAPHY CHEST WITH CONTRAST TECHNIQUE: Multidetector CT imaging of the chest was performed using the standard protocol during bolus administration of intravenous contrast. Multiplanar CT image reconstructions and MIPs were obtained to evaluate the vascular anatomy. CONTRAST:  40m OMNIPAQUE IOHEXOL 350 MG/ML SOLN COMPARISON:  Chest radiographs dated 07/02/2019. High-resolution CT chest dated 02/22/2019. FINDINGS: Cardiovascular: Evaluation of the pulmonary arteries is constrained by respiratory motion. Within that constraint, there is no evidence of pulmonary embolism. Heart is top-normal in size.  No pericardial effusion. No evidence of  thoracic aortic aneurysm. Very mild atherosclerotic calcifications of the aortic arch. Mediastinum/Nodes: 17 mm short axis subcarinal node. 18 mm short axis right hilar node. 8 mm short axis left hilar node, technically within normal limits. These findings are favored to be new from the prior. Visualized thyroid is unremarkable. Lungs/Pleura: Patchy opacities in the medial right middle lobe and right lower lobe, suspicious for pneumonia. Mild patchy opacity anteriorly in the left lower lobe, atelectasis versus pneumonia. Evaluation of the lung parenchyma is constrained by respiratory motion. Within that constraint, there are no suspicious pulmonary nodules. Mild ground-glass opacity/mosaic attenuation in the lungs bilaterally, chronic/unchanged from the prior, favoring air trapping. Underlying mild centrilobular emphysematous changes in the upper lobes. No pleural effusion or pneumothorax. Upper Abdomen: Visualized upper abdomen is grossly unremarkable. Musculoskeletal: Visualized osseous structures are within normal limits. Review of the MIP images confirms the above findings. IMPRESSION: No evidence of pulmonary embolism. Right middle and lower lobe pneumonia.  Atelectasis versus pneumonia in the left lower lobe. Mediastinal/hilar lymphadenopathy, new from the prior. Although possibly reactive, follow-up CT chest (with contrast) is suggested in 3 months to assess for resolution. Aortic Atherosclerosis (ICD10-I70.0) and Emphysema (ICD10-J43.9). Electronically Signed   By: Julian Hy M.D.   On: 07/03/2019 10:21   DG Chest Port 1 View  Result Date: 07/04/2019 CLINICAL DATA:  Acute respiratory failure. EXAM: PORTABLE CHEST 1 VIEW COMPARISON:  CT 07/03/2019.  Chest x-ray 07/02/2019. FINDINGS: Cardiomegaly. Bilateral pulmonary infiltrates/edema again noted. Bilateral pneumonia and/or CHF could present in this fashion. No significant interim change no prominent pleural effusion. No pneumothorax. IMPRESSION: Cardiomegaly. Bilateral pulmonary infiltrates/edema again noted. Bilateral pneumonia or CHF could present this fashion. No significant interim change Electronically Signed   By: Marcello Moores  Register   On: 07/04/2019 06:36   DG Knee Complete 4 Views Left  Result Date: 06/19/2019 CLINICAL DATA:  54 year old female status post fall 2 days ago with continued pain. EXAM: LEFT KNEE - COMPLETE 4+ VIEW COMPARISON:  Left tib-fib series today. FINDINGS: Comminuted spiral fracture of the proximal fibula with slight anterior displacement again noted. The proximal tibia appears intact. There is a 15 mm linear bone fragment adjacent to the proximal aspect of the medial distal femoral condyle. On the cross-table lateral view no joint effusion is identified. The patella appears intact. The remaining distal femur appears intact. IMPRESSION: 1. Acute 15 mm bone fragment from the medial distal femur suggesting MCL avulsion. 2. Superimposed comminuted spiral fracture of the proximal fibula. 3. No tibia fracture or joint effusion identified. Electronically Signed   By: Genevie Ann M.D.   On: 06/19/2019 11:39   ECHOCARDIOGRAM COMPLETE  Result Date: 07/04/2019   ECHOCARDIOGRAM REPORT    Patient Name:   Diane Gilbert Date of Exam: 07/04/2019 Medical Rec #:  [id number]            Height:       64.0 in Accession #:    [id number]           Weight:       238.5 lb Date of Birth:  12/15/1965           BSA:          2.11 m Patient Age:    21 years             BP:           179/96 mmHg Patient Gender: F  HR:           86 bpm. Exam Location:  Inpatient Procedure: 2D Echo Indications:    Pulmonary hypertension 416.8 / I27.2  History:        Patient has prior history of Echocardiogram examinations, most                 recent 08/11/2017. Risk Factors:Sleep Apnea. Acute respiratory                 failure with hypoxia                 Alcohol abuse.  Sonographer:    Vikki Ports Turrentine Referring Phys: [place]  1. Left ventricular ejection fraction, by visual estimation, is 60 to 65%. The left ventricle has normal function. There is no left ventricular hypertrophy.  2. Left ventricular diastolic parameters are consistent with Grade II diastolic dysfunction (pseudonormalization).  3. Elevated left ventricular end-diastolic pressure.  4. Global right ventricle has normal systolic function.The right ventricular size is mildly enlarged. Right vetricular wall thickness was not assessed.  5. TR signal is inadequate for assessing pulmonary artery systolic pressure.  6. Left atrial size was mildly dilated.  7. Right atrial size was mild-moderately dilated.  8. Trivial pericardial effusion is present.  9. The mitral valve is normal in structure. Trivial mitral valve regurgitation. No evidence of mitral stenosis. 10. The tricuspid valve is normal in structure. 11. The aortic valve is tricuspid. Aortic valve regurgitation is not visualized. No evidence of aortic valve sclerosis or stenosis. 12. The pulmonic valve was not well visualized. Pulmonic valve regurgitation is trivial. 13. The inferior vena cava is dilated in size with <50% respiratory variability, suggesting right  atrial pressure of 15 mmHg. 14. The interatrial septum was not well visualized. FINDINGS  Left Ventricle: Left ventricular ejection fraction, by visual estimation, is 60 to 65%. The left ventricle has normal function. The left ventricle is not well visualized. There is no left ventricular hypertrophy. Left ventricular diastolic parameters are consistent with Grade II diastolic dysfunction (pseudonormalization). Elevated left ventricular end-diastolic pressure. Right Ventricle: The right ventricular size is mildly enlarged. Right vetricular wall thickness was not assessed. Global RV systolic function is has normal systolic function. Left Atrium: Left atrial size was mildly dilated. Right Atrium: Right atrial size was mild-moderately dilated Pericardium: Trivial pericardial effusion is present. Mitral Valve: The mitral valve is normal in structure. Trivial mitral valve regurgitation. No evidence of mitral valve stenosis by observation. Tricuspid Valve: The tricuspid valve is normal in structure. Tricuspid valve regurgitation is trivial. Aortic Valve: The aortic valve is tricuspid. Aortic valve regurgitation is not visualized. The aortic valve is structurally normal, with no evidence of sclerosis or stenosis. Pulmonic Valve: The pulmonic valve was not well visualized. Pulmonic valve regurgitation is trivial. Pulmonic regurgitation is trivial. Aorta: The aortic root is normal in size and structure, the ascending aorta was not well visualized and the aortic arch was not well visualized. Venous: The inferior vena cava is dilated in size with less than 50% respiratory variability, suggesting right atrial pressure of 15 mmHg. IAS/Shunts: The interatrial septum was not well visualized.  LEFT VENTRICLE PLAX 2D LVIDd:         5.07 cm  Diastology LVIDs:         3.06 cm  LV e' lateral:   11.50 cm/s LV PW:         0.93 cm  LV E/e' lateral: 9.3 LV IVS:  0.95 cm  LV e' medial:    6.64 cm/s LVOT diam:     2.10 cm  LV E/e'  medial:  16.1 LV SV:         85 ml LV SV Index:   37.66 LVOT Area:     3.46 cm  RIGHT VENTRICLE RV S prime:     12.90 cm/s TAPSE (M-mode): 2.9 cm LEFT ATRIUM           Index       RIGHT ATRIUM           Index LA diam:      4.80 cm 2.28 cm/m  RA Area:     27.30 cm LA Vol (A4C): 78.1 ml 37.04 ml/m RA Volume:   85.80 ml  40.69 ml/m  AORTIC VALVE LVOT Vmax:   119.00 cm/s LVOT Vmean:  86.400 cm/s LVOT VTI:    0.254 m  AORTA Ao Root diam: 2.90 cm MITRAL VALVE MV Area (PHT): 4.17 cm              SHUNTS MV PHT:        52.78 msec            Systemic VTI:  0.25 m MV Decel Time: 182 msec              Systemic Diam: 2.10 cm MV E velocity: 107.00 cm/s 103 cm/s MV A velocity: 75.80 cm/s  70.3 cm/s MV E/A ratio:  1.41        1.5  Cherlynn Kaiser MD Electronically signed by Cherlynn Kaiser MD Signature Date/Time: 07/04/2019/7:53:20 PM    Final    VAS [place] LOWER EXTREMITY VENOUS (DVT) (ONLY [facility] & [facility])  Result Date: 07/03/2019  Lower Venous Study Indications: Swelling.  Limitations: Body habitus, poor ultrasound/tissue interface and patient sitting in Fowler's position. Comparison Study: No prior study. Performing Technologist: Maudry Mayhew MHA, RDMS, RVT, RDCS  Examination Guidelines: A complete evaluation includes B-mode imaging, spectral Doppler, color Doppler, and power Doppler as needed of all accessible portions of each vessel. Bilateral testing is considered an integral part of a complete examination. Limited examinations for reoccurring indications may be performed as noted.  +---------+---------------+---------+-----------+----------+--------------+  LEFT      Compressibility Phasicity Spontaneity Properties Thrombus Aging  +---------+---------------+---------+-----------+----------+--------------+  CFV                                                        Not visualized  +---------+---------------+---------+-----------+----------+--------------+  SFJ                                                        Not  visualized  +---------+---------------+---------+-----------+----------+--------------+  FV Prox   Full                                                             +---------+---------------+---------+-----------+----------+--------------+  FV Mid    Full                                                             +---------+---------------+---------+-----------+----------+--------------+  FV Distal                                                  Not visualized  +---------+---------------+---------+-----------+----------+--------------+  PFV                                                        Not visualized  +---------+---------------+---------+-----------+----------+--------------+  POP       Full                      Yes                                    +---------+---------------+---------+-----------+----------+--------------+  PTV       Full                                                             +---------+---------------+---------+-----------+----------+--------------+  PERO                                                       Not visualized  +---------+---------------+---------+-----------+----------+--------------+  Summary: Left: There is no evidence of deep vein thrombosis in the lower extremity. However, portions of this examination were limited- see technologist comments above. No cystic structure found in the popliteal fossa. Study was very technically limited and difficult due to patient sitting completely upright and on bipap.  *See table(s) above for measurements and observations. Electronically signed by Ruta Hinds MD on 07/03/2019 at 4:45:19 PM.    Final      Subjective: - no chest pain, shortness of breath, no abdominal pain, nausea or vomiting.   Discharge Exam: BP 120/68 (BP Location: Right Arm)    Pulse 68    Temp 98.6 F (37 C) (Oral)    Resp 15    Ht _0  (1.676 m)    Wt 101.5 kg    LMP 04/06/2017    SpO2 99%    BMI 36.12 kg/m   General: Pt is alert, awake, not in  acute distress Cardiovascular: RRR, S1/S2 +, no rubs, no gallops Respiratory: CTA bilaterally, no wheezing, no rhonchi Abdominal: Soft, NT, ND, bowel sounds + Extremities: no edema, no cyanosis    The results of significant diagnostics from this hospitalization (including imaging, microbiology, ancillary and laboratory) are listed below for reference.     Microbiology: Recent Results (from the past 240 hour(s))  Blood culture (routine x 2)     Status: None   Collection Time: 07/03/19  9:00 AM   Specimen: BLOOD RIGHT HAND  Result Value Ref Range Status   Specimen Description BLOOD RIGHT HAND  Final   Special Requests   Final    BOTTLES DRAWN AEROBIC AND ANAEROBIC Blood Culture results may not be optimal  due to an inadequate volume of blood received in culture bottles   Culture   Final    NO GROWTH 5 DAYS Performed at [facility], [place] [address] [place], [place] [address]    Report Status 07/08/2019 FINAL  Final  Respiratory Panel by RT PCR (Flu A&B, Covid) - Nasopharyngeal Swab     Status: None   Collection Time: 07/03/19  9:15 AM   Specimen: Nasopharyngeal Swab  Result Value Ref Range Status   SARS Coronavirus 2 by RT PCR NEGATIVE NEGATIVE Final    Comment: (NOTE) SARS-CoV-2 target nucleic acids are NOT DETECTED. The SARS-CoV-2 RNA is generally detectable in upper respiratoy specimens during the acute phase of infection. The lowest concentration of SARS-CoV-2 viral copies this assay can detect is 131 copies/mL. A negative result does not preclude SARS-Cov-2 infection and should not be used as the sole basis for treatment or other patient management decisions. A negative result may occur with  improper specimen collection/handling, submission of specimen other than nasopharyngeal swab, presence of viral mutation(s) within the areas targeted by this assay, and inadequate number of viral copies (<131 copies/mL). A negative result must be combined with  clinical observations, patient history, and epidemiological information. The expected result is Negative. Fact Sheet for Patients:  PinkCheek.be Fact Sheet for Healthcare Providers:  GravelBags.it This test is not yet ap proved or cleared by the [place] FDA and  has been authorized for detection and/or diagnosis of SARS-CoV-2 by FDA under an Emergency Use Authorization (EUA). This EUA will remain  in effect (meaning this test can be used) for the duration of the COVID-19 declaration under Section 564(b)(1) of the Act, 21 U.S.C. section 360bbb-3(b)(1), unless the authorization is terminated or revoked sooner.    Influenza A by PCR NEGATIVE NEGATIVE Final   Influenza B by PCR NEGATIVE NEGATIVE Final    Comment: (NOTE) The Xpert Xpress SARS-CoV-2/FLU/RSV assay is intended as an aid in  the diagnosis of influenza from Nasopharyngeal swab specimens and  should not be used as a sole basis for treatment. Nasal washings and  aspirates are unacceptable for Xpert Xpress SARS-CoV-2/FLU/RSV  testing. Fact Sheet for Patients: PinkCheek.be Fact Sheet for Healthcare Providers: GravelBags.it This test is not yet approved or cleared by the [place] FDA and  has been authorized for detection and/or diagnosis of SARS-CoV-2 by  FDA under an Emergency Use Authorization (EUA). This EUA will remain  in effect (meaning this test can be used) for the duration of the  Covid-19 declaration under Section 564(b)(1) of the Act, 21  U.S.C. section 360bbb-3(b)(1), unless the authorization is  terminated or revoked. Performed at [facility], [place] [address] [place], [place] [address]   Blood culture (routine x 2)     Status: None   Collection Time: 07/03/19  9:15 AM   Specimen: BLOOD  Result Value Ref Range Status   Specimen Description BLOOD LEFT ANTECUBITAL  Final   Special  Requests   Final    BOTTLES DRAWN AEROBIC AND ANAEROBIC Blood Culture results may not be optimal due to an inadequate volume of blood received in culture bottles   Culture   Final    NO GROWTH 5 DAYS Performed at [facility], [place] [address], [place], [place] [address]    Report Status 07/08/2019 FINAL  Final  MRSA PCR Screening     Status: None   Collection Time: 07/04/19 12:46 AM   Specimen: Nasopharyngeal  Result Value Ref  Range Status   MRSA by PCR NEGATIVE NEGATIVE Final    Comment:        The GeneXpert MRSA Assay (FDA approved for NASAL specimens only), is one component of a comprehensive MRSA colonization surveillance program. It is not intended to diagnose MRSA infection nor to guide or monitor treatment for MRSA infections. Performed at [facility], [place] [address], [place], [place] [address]      Labs: Basic Metabolic Panel: Recent Labs  Lab 07/06/19 0440 07/07/19 0209 07/08/19 0853 07/09/19 0357 07/10/19 0451 07/11/19 0602 07/12/19 0435  NA 138 136 130* 135 137 140 139  K 3.5 3.5 3.1* 3.7 4.1 4.0 4.4  CL 81* 82* 80* 90* 94* 93* 95*  CO2 44* 39* 38* 35* 32 35* 34*  GLUCOSE 117* 128* 184* 138* 170* 105* 95  BUN 17 30* 47* 46* 22* 12 11  CREATININE 0.76 0.95 1.98* 1.20* 0.73 0.73 0.68  CALCIUM 9.3 9.3 8.5* 8.6* 9.2 9.4 9.1  MG 2.1 2.3  --   --   --   --   --   PHOS 3.0  --   --   --   --   --   --    Liver Function Tests: No results for input(s): AST, ALT, ALKPHOS, BILITOT, PROT, ALBUMIN in the last 168 hours. CBC: Recent Labs  Lab 07/07/19 0209 07/08/19 0853 07/09/19 0357 07/10/19 0451 07/12/19 0435  WBC 10.7* 9.5 8.2 7.6 9.7  NEUTROABS 6.3  --   --   --   --   HGB 17.6* 16.8* 15.6* 15.2* 14.8  HCT 59.0* 56.1* 52.2* 52.5* 50.6*  MCV 92.2 91.4 91.9 94.1 94.6  PLT 262 215 187 190 173   CBG: Recent Labs  Lab 07/11/19 1058 07/11/19 1656 07/11/19 2329 07/12/19 0039 07/12/19 0416  GLUCAP 186* 196* 136* 122* 111*   Hgb A1c No  results for input(s): HGBA1C in the last 72 hours. Lipid Profile No results for input(s): CHOL, HDL, LDLCALC, TRIG, CHOLHDL, LDLDIRECT in the last 72 hours. Thyroid function studies No results for input(s): TSH, T4TOTAL, T3FREE, THYROIDAB in the last 72 hours.  Invalid input(s): FREET3 Urinalysis    Component Value Date/Time   COLORURINE STRAW (A) 03/29/2019 [place] 03/29/2019 1840   LABSPEC 1.028 03/29/2019 1840   PHURINE 7.0 03/29/2019 1840   GLUCOSEU NEGATIVE 03/29/2019 1840   HGBUR NEGATIVE 03/29/2019 [place] NEGATIVE 03/29/2019 [place] 03/29/2019 1840   PROTEINUR 100 (A) 03/29/2019 1840   UROBILINOGEN 0.2 07/13/2014 1122   NITRITE NEGATIVE 03/29/2019 1840   LEUKOCYTESUR NEGATIVE 03/29/2019 1840    FURTHER DISCHARGE INSTRUCTIONS:   Get Medicines reviewed and adjusted: Please take all your medications with you for your next visit with your Primary MD   Laboratory/radiological data: Please request your Primary MD to go over all hospital tests and procedure/radiological results at the follow up, please ask your Primary MD to get all Hospital records sent to his/her office.   In some cases, they will be blood work, cultures and biopsy results pending at the time of your discharge. Please request that your primary care M.D. goes through all the records of your hospital data and follows up on these results.   Also Note the following: If you experience worsening of your admission symptoms, develop shortness of breath, life threatening emergency, suicidal or homicidal thoughts you must seek medical attention immediately by calling 911 or calling your MD immediately  if  symptoms less severe.   You must read complete instructions/literature along with all the possible adverse reactions/side effects for all the Medicines you take and that have been prescribed to you. Take any new Medicines after you have completely understood and accpet all  the possible adverse reactions/side effects.    Do not drive when taking Pain medications or sleeping medications (Benzodaizepines)   Do not take more than prescribed Pain, Sleep and Anxiety Medications. It is not advisable to combine anxiety,sleep and pain medications without talking with your primary care practitioner   Special Instructions: If you have smoked or chewed Tobacco  in the last 2 yrs please stop smoking, stop any regular Alcohol  and or any Recreational drug use.   Wear Seat belts while driving.   Please note: You were cared for by a hospitalist during your hospital stay. Once you are discharged, your primary care physician will handle any further medical issues. Please note that NO REFILLS for any discharge medications will be authorized once you are discharged, as it is imperative that you return to your primary care physician (or establish a relationship with a primary care physician if you do not have one) for your post hospital discharge needs so that they can reassess your need for medications and monitor your lab values.  Time coordinating discharge: 35 minutes  SIGNED:  Marzetta Board, MD, PhD 07/12/2019, 7:43 AM

## 2019-07-12 NOTE — Plan of Care (Signed)
  Problem: Activity: Goal: Risk for activity intolerance will decrease Outcome: Progressing

## 2019-07-14 ENCOUNTER — Telehealth: Payer: Self-pay | Admitting: *Deleted

## 2019-07-14 NOTE — Telephone Encounter (Signed)
I rescheduled pt's pft & covid test.  Nothing further needed.

## 2019-07-14 NOTE — Telephone Encounter (Signed)
Patient missed covid testing and will need to be rescheduled. Thanks

## 2019-07-26 ENCOUNTER — Ambulatory Visit: Payer: Medicaid Other | Admitting: Pulmonary Disease

## 2019-07-26 ENCOUNTER — Encounter: Payer: Self-pay | Admitting: Pulmonary Disease

## 2019-07-26 ENCOUNTER — Other Ambulatory Visit: Payer: Self-pay

## 2019-07-26 VITALS — BP 120/80 | HR 75 | Ht 66.0 in | Wt 238.0 lb

## 2019-07-26 DIAGNOSIS — J189 Pneumonia, unspecified organism: Secondary | ICD-10-CM

## 2019-07-26 DIAGNOSIS — J84115 Respiratory bronchiolitis interstitial lung disease: Secondary | ICD-10-CM

## 2019-07-26 DIAGNOSIS — J9611 Chronic respiratory failure with hypoxia: Secondary | ICD-10-CM | POA: Diagnosis not present

## 2019-07-26 DIAGNOSIS — J849 Interstitial pulmonary disease, unspecified: Secondary | ICD-10-CM

## 2019-07-26 DIAGNOSIS — Z720 Tobacco use: Secondary | ICD-10-CM | POA: Insufficient documentation

## 2019-07-26 DIAGNOSIS — Z8669 Personal history of other diseases of the nervous system and sense organs: Secondary | ICD-10-CM

## 2019-07-26 DIAGNOSIS — R590 Localized enlarged lymph nodes: Secondary | ICD-10-CM

## 2019-07-26 MED ORDER — BUDESONIDE-FORMOTEROL FUMARATE 160-4.5 MCG/ACT IN AERO: INHALATION_SPRAY | RESPIRATORY_TRACT | 5 refills | Status: AC

## 2019-07-26 MED ORDER — ALBUTEROL SULFATE HFA 108 (90 BASE) MCG/ACT IN AERS: 2.0000 | INHALATION_SPRAY | Freq: Four times a day (QID) | RESPIRATORY_TRACT | 11 refills | Status: AC | PRN

## 2019-07-26 NOTE — Progress Notes (Signed)
Synopsis: Referred in January 2021 for tablet care with new provider by Nolene Ebbs, MD  Subjective:   PATIENT ID: Diane Gilbert GENDER: female DOB: 1966/04/11, MRN: [id number]  Chief Complaint  Patient presents with  . Follow-up    Pt was in hospital from 1/3-1/13 and was told she had pneumonia. Pt states each day is getting better for her. 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  Patient was recently seen in the hospital for admission for hypercapnic failure.  She was admitted to the intensive care unit and placed on BiPAP additionally given diuresis.  At baseline was on 10 mg daily of prednisone for emphysema and RB ILD.  Discharge summary from hospital on 07/12/2019 by Dr. Karmen Stabs was reviewed.  Patient was discharged on oral Levaquin, diagnosed with streptococcal pneumonia with urine antigen positive bilateral infiltrates on chest x-ray.  Likely component of pulmonary edema and patient was diuresed.  OV 07/26/2019: Patient doing okay after her hospitalization.  She still has significant shortness of breath with exertion.  She is currently on 2 L nasal cannula.  Unfortunately she has went right back to smoking.  She is smoking 2 packs/day.  We discussed her CAT scan results from her hospitalization as well as her current therapy.  She was discharged with antibiotics.  She never had this prescription filled.  She is not had any fevers chills night sweats weight loss.  We discussed her enlarged lymph nodes that were found on her CAT scan in the importance of follow-up.    Past Medical History:  Diagnosis Date  . Alcohol abuse   . Anxiety   . Arthritis   . Chronic diastolic CHF (congestive heart failure) ([place])   . Chronic respiratory failure with hypoxia and hypercapnia ([place]) 05/09/2017  . Community  acquired pneumonia   . CVA (cerebral vascular accident) ([place])   . Depression   . Headache(784.0)   . Hypertension   . Insomnia   . Respiratory bronchiolitis interstitial lung disease ([place])   . Schizophrenia ([place])   . Seizure [facility])      Family History  Problem Relation Age of Onset  . Breast cancer Mother   . Obstructive Sleep Apnea Mother   . COPD Maternal Aunt      Past Surgical History:  Procedure Laterality Date  . BACK SURGERY    . LUNG BIOPSY Left 07/16/2014   Procedure: LUNG BIOPSY;  Surgeon: Grace Isaac, MD;  Location: [facility];  Service: Thoracic;  Laterality: Left;  Marland Kitchen MULTIPLE TOOTH EXTRACTIONS    . RIGHT/LEFT HEART CATH AND CORONARY ANGIOGRAPHY N/A 01/04/2018   Procedure: RIGHT/LEFT HEART CATH AND CORONARY ANGIOGRAPHY;  Surgeon: Adrian Prows, MD;  Location: [facility];  Service: Cardiovascular;  Laterality: N/A;  . VIDEO ASSISTED THORACOSCOPY Left 07/16/2014   Procedure: VIDEO ASSISTED THORACOSCOPY;  Surgeon: Grace Isaac, MD;  Location: [facility];  Service: Thoracic;  Laterality: Left;  Marland Kitchen VIDEO BRONCHOSCOPY Bilateral 04/11/2014   Procedure: VIDEO BRONCHOSCOPY WITH FLUORO;  Surgeon: Kathee Delton, MD;  Location: [facility];  Service: Cardiopulmonary;  Laterality: Bilateral;  . VIDEO BRONCHOSCOPY N/A 07/16/2014   Procedure: VIDEO BRONCHOSCOPY;  Surgeon: Grace Isaac, MD;  Location: [facility];  Service: Thoracic;  Laterality: N/A;  . WEDGE RESECTION Left 07/16/2014   Procedure: WEDGE RESECTION;  Surgeon: Grace Isaac, MD;  Location: [facility];  Service: Thoracic;  Laterality: Left;  left upper lobe lung resection    Social History   Socioeconomic History  . Marital status: Significant Other    Spouse name: Not on file  . Number of children: 0  . Years of education: Not on file  . Highest education level: Not on file  Occupational History  . Occupation: unemployed  Tobacco Use  . Smoking status: Current Every Day Smoker    Packs/day: 1.50    Years:  30.00    Pack years: 45.00    Types: Cigarettes  . Smokeless tobacco: Never Used  . Tobacco comment: smoking .5ppd as of 07/26/19  Substance and Sexual Activity  . Alcohol use: Yes    Alcohol/week: 0.0 standard drinks    Comment: occasional   . Drug use: Yes    Types: Cocaine  . Sexual activity: Not Currently    Comment: husband locked up   Other Topics Concern  . Not on file  Social History Narrative  . Not on file   Social Determinants of Health   Financial Resource Strain:   . Difficulty of Paying Living Expenses: Not on file  Food Insecurity:   . Worried About Charity fundraiser in the Last Year: Not on file  . Ran Out of Food in the Last Year: Not on file  Transportation Needs:   . Lack of Transportation (Medical): Not on file  . Lack of Transportation (Non-Medical): Not on file  Physical Activity:   . Days of Exercise per Week: Not on file  . Minutes of Exercise per Session: Not on file  Stress:   . Feeling of Stress : Not on file  Social Connections:   . Frequency of Communication with Friends and Family: Not on file  . Frequency of Social Gatherings with Friends and Family: Not on file  . Attends Religious Services: Not on file  . Active Member of Clubs or Organizations: Not on file  . Attends Archivist Meetings: Not on file  . Marital Status: Not on file  Intimate Partner Violence:   . Fear of Current or Ex-Partner: Not on file  . Emotionally Abused: Not on file  . Physically Abused: Not on file  . Sexually Abused: Not on file     Allergies  Allergen Reactions  . Unasyn [Ampicillin-Sulbactam Sodium] Swelling and Rash    angioedema  . Contrast Media [Iodinated Diagnostic Agents] Nausea And Vomiting  . Hydralazine Swelling     Outpatient Medications Prior to Visit  Medication Sig Dispense Refill  . albuterol (PROVENTIL HFA;VENTOLIN HFA) 108 (90 BASE) MCG/ACT inhaler Inhale 2 puffs into the lungs every 6 (six) hours as needed for wheezing or  shortness of breath. 1 Inhaler 3  . albuterol (PROVENTIL) (2.5 MG/3ML) 0.083% nebulizer solution Take 3 mLs (2.5 mg total) by nebulization every 6 (six) hours as needed for wheezing or shortness of breath. 75 mL 2  . ARIPiprazole (ABILIFY) 5 MG tablet Take 5 mg by mouth daily.    Marland Kitchen aspirin 81 MG chewable tablet Chew 1 tablet (81 mg total) by mouth daily. 30 tablet 0  . budesonide-formoterol (SYMBICORT) 160-4.5 MCG/ACT inhaler INHALE TWO puffs into THE lungs TWICE DAILY (Patient taking differently: Inhale 2 puffs into the lungs 2 (two) times daily. INHALE TWO puffs into THE lungs TWICE DAILY) 10.2 g 5  . buPROPion (WELLBUTRIN SR) 150 MG 12 hr tablet Take 150 mg by mouth daily.    . diclofenac  sodium (VOLTAREN) 1 % GEL Apply 2 g topically 4 (four) times daily.    Marland Kitchen FLUoxetine (PROZAC) 20 MG capsule Take 20 mg by mouth daily.    . furosemide (LASIX) 40 MG tablet Take 1 tablet (40 mg total) by mouth 2 (two) times daily. 30 tablet 0  . gabapentin (NEURONTIN) 300 MG capsule Take 300 mg by mouth 3 (three) times daily.    Marland Kitchen levETIRAcetam (KEPPRA) 1000 MG tablet Take 1 tablet (1,000 mg total) by mouth 2 (two) times daily.    . metoprolol succinate (TOPROL-XL) 25 MG 24 hr tablet Take 25 mg by mouth daily.    . OXYGEN Inhale 4 L into the lungs continuous.     Marland Kitchen QUEtiapine (SEROQUEL) 100 MG tablet Take 1 tablet (100 mg total) by mouth 2 (two) times daily.    . rosuvastatin (CRESTOR) 10 MG tablet Take 1 tablet (10 mg total) by mouth daily. 30 tablet 1  . tiZANidine (ZANAFLEX) 4 MG capsule Take 4 mg by mouth 2 (two) times daily as needed for muscle spasms.     . traZODone (DESYREL) 100 MG tablet Take 100 mg by mouth at bedtime.    Marland Kitchen levofloxacin (LEVAQUIN) 750 MG tablet Take 1 tablet (750 mg total) by mouth daily. (Patient not taking: Reported on 07/26/2019) 2 tablet 0   No facility-administered medications prior to visit.    Review of Systems  Constitutional: Negative for chills, fever, malaise/fatigue and  weight loss.  HENT: Negative for hearing loss, sore throat and tinnitus.   Eyes: Negative for blurred vision and double vision.  Respiratory: Positive for cough, sputum production, shortness of breath and wheezing. Negative for hemoptysis and stridor.   Cardiovascular: Positive for leg swelling. Negative for chest pain, palpitations, orthopnea and PND.  Gastrointestinal: Negative for abdominal pain, constipation, diarrhea, heartburn, nausea and vomiting.  Genitourinary: Negative for dysuria, hematuria and urgency.  Musculoskeletal: Negative for joint pain and myalgias.  Skin: Negative for itching and rash.  Neurological: Negative for dizziness, tingling, weakness and headaches.  Endo/Heme/Allergies: Negative for environmental allergies. Does not bruise/bleed easily.  Psychiatric/Behavioral: Negative for depression. The patient is not nervous/anxious and does not have insomnia.   All other systems reviewed and are negative.    Objective:  Physical Exam Vitals reviewed.  Constitutional:      General: She is not in acute distress.    Appearance: She is well-developed.  HENT:     Head: Normocephalic and atraumatic.  Eyes:     General: No scleral icterus.    Conjunctiva/sclera: Conjunctivae normal.     Pupils: Pupils are equal, round, and reactive to light.  Neck:     Vascular: No JVD.     Trachea: No tracheal deviation.  Cardiovascular:     Rate and Rhythm: Normal rate and regular rhythm.     Heart sounds: Normal heart sounds. No murmur.  Pulmonary:     Effort: Pulmonary effort is normal. No tachypnea, accessory muscle usage or respiratory distress.     Breath sounds: Normal breath sounds. No stridor. No wheezing, rhonchi or rales.  Abdominal:     Comments: Obese pannus  Musculoskeletal:        General: No tenderness.     Cervical back: Neck supple.     Right lower leg: Edema present.     Left lower leg: Edema present.  Lymphadenopathy:     Cervical: No cervical adenopathy.    Skin:    General: Skin is warm and dry.  Capillary Refill: Capillary refill takes less than 2 seconds.     Findings: No rash.  Neurological:     Mental Status: She is alert and oriented to person, place, and time.  Psychiatric:        Behavior: Behavior normal.      Vitals:   07/26/19 1147  BP: 120/80  Pulse: 75  SpO2: 91%  Weight: 238 lb (108 kg)  Height: _0  (1.676 m)   91% on 2 L nasal cannula BMI Readings from Last 3 Encounters:  07/26/19 38.41 kg/m  07/12/19 36.12 kg/m  04/09/19 37.59 kg/m   Wt Readings from Last 3 Encounters:  07/26/19 238 lb (108 kg)  07/12/19 223 lb 12.3 oz (101.5 kg)  04/09/19 219 lb (99.3 kg)   Chest Imaging: CTA chest 07/03/2019: Bilateral areas of groundglass opacities and areas of mosaic attenuation bilaterally some areas of consolidation predominantly within the right middle lobe and lower lobe concerning for pneumonia.  Also has mediastinal and hilar adenopathy that was presumed reactive due to the ongoing inflammatory changes within the lung.  No evidence of PE. The patient's images have been independently reviewed by me.    Pulmonary Functions Testing Results: PFT Results Latest Ref Rng & Units 01/12/2018 11/04/2016 09/06/2015 04/10/2014  FVC-Pre L 1.97 1.48 1.70 1.73  FVC-Predicted Pre % 68 50 53 53  FVC-Post L 1.94 1.36 1.61 1.72  FVC-Predicted Post % 66 46 50 53  Pre FEV1/FVC % % 85 84 85 85  Post FEV1/FCV % % 89 87 85 80  FEV1-Pre L 1.68 1.25 1.45 1.48  FEV1-Predicted Pre % 72 52 56 56  FEV1-Post L 1.73 1.18 1.37 1.38  DLCO UNC% % 62 52 49 45  DLCO COR %Predicted % 131 101 95 94  TLC L 3.27 3.02 3.14 3.25  TLC % Predicted % 64 59 58 60  RV % Predicted % 89 98 79 79       Assessment & Plan:     ICD-10-CM   1. Community acquired pneumonia of right lower lobe of lung  J18.9   2. Interstitial lung disease ([place])  J84.9   3. Chronic respiratory failure with hypoxia (HCC)  J96.11   4. Respiratory bronchiolitis  interstitial lung disease ([place])  J84.115   5. Hx of sleep apnea  Z86.69   6. Tobacco abuse  Z72.0   7. Mediastinal adenopathy  R59.0   8. Hilar adenopathy  R59.0   9. Interstitial pulmonary disease (HCC)  J84.9 CT CHEST HIGH RESOLUTION    Assessment:   This is a 54 year old female with a longstanding history of tobacco abuse.  CT imaging concerning for potential underlying smoking-related interstitial lung disease.  She also is morbidly obese.  She has mediastinal and hilar adenopathy which is slightly enlarged but likely reactive due to the ongoing inflammatory changes that were seen in her lungs from the last CT imaging.  She does however need follow-up for this.  If there was enlarging mediastinal adenopathy in comparison to previous or there was additional parenchymal changes within the lung could consider diagnostic bronchoscopy.  This was discussed with the patient today in the office.  Her new problems include the mediastinal and hilar adenopathy as well as a recent admission for community-acquired pneumonia and the lower lobe infiltrates will also need to be followed up on repeat imaging.  Also has history of OSA noncompliant with CPAP.  Patient was counseled on tobacco abuse cessation.  She must quit smoking.  If she  does not quit smoking the likelihood of continued decline in her respiratory function and recurrent admissions to the hospital as well as potentially death from this disease is likely.  Plan Following Extensive Data Review & Interpretation:  . I reviewed prior external note(s) from 07/12/2019 discharge summary, Dr. Cruzita Lederer reviewed, 07/11/2019 progress note, Dr. Grandville Silos hospitalization reviewed. . I reviewed the result(s) of lab work from hospitalization, positive streptococcal urine antigen.  CBC stable, BMP with mildly elevated bicarb. . I have ordered high-resolution CT scan of the chest to be completed in 3 months.  This can be utilized to follow-up on the peripheral  infiltrates and groundglass opacities within the chest as well as the mediastinal and hilar adenopathy to ensure resolution.  Pulmonary function tests ordered for follow-up at next office visit.  Referral to see one of our sleep physicians here in clinic.  Will need to establish care likely need repeat sleep study. Refills for albuterol as well as Symbicort inhaler given. Patient counseled heavily again on smoking cessation.  Patient must quit smoking. Not a candidate for Chantix due to psychiatric history. Would recommend 21 mcg nicotine patches in conjunction with tapering method.  Discussed in detail today.   Garner Nash, DO [facility] 07/26/2019 11:58 AM

## 2019-07-26 NOTE — Patient Instructions (Addendum)
Thank you for visiting Dr. Valeta Harms at [facility]. Today we recommend the following:  Orders Placed This Encounter  Procedures  . CT CHEST HIGH RESOLUTION  . Pulmonary Function Test  Both scheduled in 3 months   Meds ordered this encounter  Medications  . albuterol (VENTOLIN HFA) 108 (90 Base) MCG/ACT inhaler    Sig: Inhale 2 puffs into the lungs every 6 (six) hours as needed for wheezing or shortness of breath.    Dispense:  18 g    Refill:  11  . budesonide-formoterol (SYMBICORT) 160-4.5 MCG/ACT inhaler    Sig: INHALE TWO puffs into THE lungs TWICE DAILY    Dispense:  10.2 g    Refill:  5    This prescription was filled on 05/25/2018. Any refills authorized will be placed on file.   Please set patient up with Dr. Ander Slade for next available sleep consultation.  Return in about 3 months (around 10/24/2019). After studies complete.     Please do your part to reduce the spread of COVID-19.

## 2019-08-08 ENCOUNTER — Ambulatory Visit: Payer: Medicaid Other | Admitting: Cardiology

## 2019-08-10 ENCOUNTER — Institutional Professional Consult (permissible substitution): Payer: Medicaid Other | Admitting: Pulmonary Disease

## 2019-08-12 ENCOUNTER — Encounter (HOSPITAL_COMMUNITY): Payer: Self-pay | Admitting: Emergency Medicine

## 2019-08-12 ENCOUNTER — Inpatient Hospital Stay (HOSPITAL_COMMUNITY): Payer: Medicaid Other

## 2019-08-12 ENCOUNTER — Emergency Department (HOSPITAL_COMMUNITY): Payer: Medicaid Other

## 2019-08-12 ENCOUNTER — Inpatient Hospital Stay (HOSPITAL_COMMUNITY)
Admission: EM | Admit: 2019-08-12 | Discharge: 2019-08-28 | DRG: 917 | Disposition: E | Payer: Medicaid Other | Attending: Pulmonary Disease | Admitting: Pulmonary Disease

## 2019-08-12 DIAGNOSIS — G9341 Metabolic encephalopathy: Secondary | ICD-10-CM | POA: Diagnosis present

## 2019-08-12 DIAGNOSIS — I468 Cardiac arrest due to other underlying condition: Secondary | ICD-10-CM | POA: Diagnosis present

## 2019-08-12 DIAGNOSIS — G4733 Obstructive sleep apnea (adult) (pediatric): Secondary | ICD-10-CM | POA: Diagnosis present

## 2019-08-12 DIAGNOSIS — Z7952 Long term (current) use of systemic steroids: Secondary | ICD-10-CM

## 2019-08-12 DIAGNOSIS — J849 Interstitial pulmonary disease, unspecified: Secondary | ICD-10-CM | POA: Diagnosis present

## 2019-08-12 DIAGNOSIS — E871 Hypo-osmolality and hyponatremia: Secondary | ICD-10-CM | POA: Diagnosis present

## 2019-08-12 DIAGNOSIS — Z8673 Personal history of transient ischemic attack (TIA), and cerebral infarction without residual deficits: Secondary | ICD-10-CM | POA: Diagnosis not present

## 2019-08-12 DIAGNOSIS — I5033 Acute on chronic diastolic (congestive) heart failure: Secondary | ICD-10-CM | POA: Diagnosis present

## 2019-08-12 DIAGNOSIS — F329 Major depressive disorder, single episode, unspecified: Secondary | ICD-10-CM | POA: Diagnosis present

## 2019-08-12 DIAGNOSIS — Z20822 Contact with and (suspected) exposure to covid-19: Secondary | ICD-10-CM | POA: Diagnosis present

## 2019-08-12 DIAGNOSIS — F1410 Cocaine abuse, uncomplicated: Secondary | ICD-10-CM | POA: Diagnosis present

## 2019-08-12 DIAGNOSIS — D72829 Elevated white blood cell count, unspecified: Secondary | ICD-10-CM | POA: Diagnosis present

## 2019-08-12 DIAGNOSIS — Z5289 Donor of other specified organs or tissues: Secondary | ICD-10-CM

## 2019-08-12 DIAGNOSIS — I469 Cardiac arrest, cause unspecified: Secondary | ICD-10-CM | POA: Diagnosis present

## 2019-08-12 DIAGNOSIS — E669 Obesity, unspecified: Secondary | ICD-10-CM | POA: Diagnosis present

## 2019-08-12 DIAGNOSIS — G931 Anoxic brain damage, not elsewhere classified: Secondary | ICD-10-CM | POA: Diagnosis present

## 2019-08-12 DIAGNOSIS — G40909 Epilepsy, unspecified, not intractable, without status epilepticus: Secondary | ICD-10-CM | POA: Diagnosis present

## 2019-08-12 DIAGNOSIS — Z515 Encounter for palliative care: Secondary | ICD-10-CM | POA: Diagnosis not present

## 2019-08-12 DIAGNOSIS — Z7982 Long term (current) use of aspirin: Secondary | ICD-10-CM

## 2019-08-12 DIAGNOSIS — Z720 Tobacco use: Secondary | ICD-10-CM | POA: Diagnosis present

## 2019-08-12 DIAGNOSIS — Z91041 Radiographic dye allergy status: Secondary | ICD-10-CM

## 2019-08-12 DIAGNOSIS — F1010 Alcohol abuse, uncomplicated: Secondary | ICD-10-CM | POA: Diagnosis present

## 2019-08-12 DIAGNOSIS — J9621 Acute and chronic respiratory failure with hypoxia: Secondary | ICD-10-CM | POA: Diagnosis present

## 2019-08-12 DIAGNOSIS — I5031 Acute diastolic (congestive) heart failure: Secondary | ICD-10-CM | POA: Diagnosis present

## 2019-08-12 DIAGNOSIS — Z7951 Long term (current) use of inhaled steroids: Secondary | ICD-10-CM

## 2019-08-12 DIAGNOSIS — R410 Disorientation, unspecified: Secondary | ICD-10-CM | POA: Diagnosis present

## 2019-08-12 DIAGNOSIS — I472 Ventricular tachycardia: Secondary | ICD-10-CM | POA: Diagnosis present

## 2019-08-12 DIAGNOSIS — Z881 Allergy status to other antibiotic agents status: Secondary | ICD-10-CM

## 2019-08-12 DIAGNOSIS — M1990 Unspecified osteoarthritis, unspecified site: Secondary | ICD-10-CM | POA: Diagnosis present

## 2019-08-12 DIAGNOSIS — I110 Hypertensive heart disease with heart failure: Secondary | ICD-10-CM | POA: Diagnosis present

## 2019-08-12 DIAGNOSIS — F209 Schizophrenia, unspecified: Secondary | ICD-10-CM | POA: Diagnosis present

## 2019-08-12 DIAGNOSIS — Z66 Do not resuscitate: Secondary | ICD-10-CM | POA: Diagnosis not present

## 2019-08-12 DIAGNOSIS — I4901 Ventricular fibrillation: Secondary | ICD-10-CM | POA: Diagnosis present

## 2019-08-12 DIAGNOSIS — J84115 Respiratory bronchiolitis interstitial lung disease: Secondary | ICD-10-CM | POA: Diagnosis present

## 2019-08-12 DIAGNOSIS — Z9981 Dependence on supplemental oxygen: Secondary | ICD-10-CM | POA: Diagnosis not present

## 2019-08-12 DIAGNOSIS — Z79899 Other long term (current) drug therapy: Secondary | ICD-10-CM

## 2019-08-12 DIAGNOSIS — J439 Emphysema, unspecified: Secondary | ICD-10-CM | POA: Diagnosis present

## 2019-08-12 DIAGNOSIS — Y92009 Unspecified place in unspecified non-institutional (private) residence as the place of occurrence of the external cause: Secondary | ICD-10-CM

## 2019-08-12 DIAGNOSIS — R739 Hyperglycemia, unspecified: Secondary | ICD-10-CM | POA: Diagnosis present

## 2019-08-12 DIAGNOSIS — T405X1A Poisoning by cocaine, accidental (unintentional), initial encounter: Secondary | ICD-10-CM | POA: Diagnosis present

## 2019-08-12 DIAGNOSIS — Z803 Family history of malignant neoplasm of breast: Secondary | ICD-10-CM

## 2019-08-12 DIAGNOSIS — E876 Hypokalemia: Secondary | ICD-10-CM | POA: Diagnosis present

## 2019-08-12 DIAGNOSIS — F17210 Nicotine dependence, cigarettes, uncomplicated: Secondary | ICD-10-CM | POA: Diagnosis present

## 2019-08-12 DIAGNOSIS — Z825 Family history of asthma and other chronic lower respiratory diseases: Secondary | ICD-10-CM

## 2019-08-12 DIAGNOSIS — Z6838 Body mass index (BMI) 38.0-38.9, adult: Secondary | ICD-10-CM

## 2019-08-12 DIAGNOSIS — J9622 Acute and chronic respiratory failure with hypercapnia: Secondary | ICD-10-CM | POA: Diagnosis present

## 2019-08-12 DIAGNOSIS — Z888 Allergy status to other drugs, medicaments and biological substances status: Secondary | ICD-10-CM

## 2019-08-12 DIAGNOSIS — E8770 Fluid overload, unspecified: Secondary | ICD-10-CM | POA: Diagnosis not present

## 2019-08-12 LAB — POCT I-STAT 7, (LYTES, BLD GAS, ICA,H+H)
Bicarbonate: 32.8 mmol/L — ABNORMAL HIGH (ref 20.0–28.0)
Calcium, Ion: 1.12 mmol/L — ABNORMAL LOW (ref 1.15–1.40)
HCT: 58 % — ABNORMAL HIGH (ref 36.0–46.0)
Hemoglobin: 19.7 g/dL — ABNORMAL HIGH (ref 12.0–15.0)
O2 Saturation: 97 %
Patient temperature: 96.8
Potassium: 3.9 mmol/L (ref 3.5–5.1)
Sodium: 141 mmol/L (ref 135–145)
TCO2: 35 mmol/L — ABNORMAL HIGH (ref 22–32)
pCO2 arterial: 82.6 mmHg (ref 32.0–48.0)
pH, Arterial: 7.201 — ABNORMAL LOW (ref 7.350–7.450)
pO2, Arterial: 105 mmHg (ref 83.0–108.0)

## 2019-08-12 LAB — COMPREHENSIVE METABOLIC PANEL
ALT: 19 U/L (ref 0–44)
AST: 48 U/L — ABNORMAL HIGH (ref 15–41)
Albumin: 3.5 g/dL (ref 3.5–5.0)
Alkaline Phosphatase: 116 U/L (ref 38–126)
Anion gap: 24 — ABNORMAL HIGH (ref 5–15)
BUN: 8 mg/dL (ref 6–20)
CO2: 24 mmol/L (ref 22–32)
Calcium: 9.3 mg/dL (ref 8.9–10.3)
Chloride: 91 mmol/L — ABNORMAL LOW (ref 98–111)
Creatinine, Ser: 1.23 mg/dL — ABNORMAL HIGH (ref 0.44–1.00)
GFR calc Af Amer: 59 mL/min — ABNORMAL LOW (ref >60)
GFR calc non Af Amer: 51 mL/min — ABNORMAL LOW (ref >60)
Glucose, Bld: 241 mg/dL — ABNORMAL HIGH (ref 70–99)
Potassium: 4.3 mmol/L (ref 3.5–5.1)
Sodium: 139 mmol/L (ref 135–145)
Total Bilirubin: 0.7 mg/dL (ref 0.3–1.2)
Total Protein: 7.4 g/dL (ref 6.5–8.1)

## 2019-08-12 LAB — LACTIC ACID, PLASMA: Lactic Acid, Venous: >11 mmol/L (ref 0.5–1.9)

## 2019-08-12 LAB — CBC WITH DIFFERENTIAL/PLATELET
Abs Immature Granulocytes: 0.28 10*3/uL — ABNORMAL HIGH (ref 0.00–0.07)
Basophils Absolute: 0.1 10*3/uL (ref 0.0–0.1)
Basophils Relative: 1 %
Eosinophils Absolute: 0.3 10*3/uL (ref 0.0–0.5)
Eosinophils Relative: 1 %
HCT: 63.4 % — ABNORMAL HIGH (ref 36.0–46.0)
Hemoglobin: 17 g/dL — ABNORMAL HIGH (ref 12.0–15.0)
Immature Granulocytes: 1 %
Lymphocytes Relative: 74 %
Lymphs Abs: 17.7 10*3/uL — ABNORMAL HIGH (ref 0.7–4.0)
MCH: 26.8 pg (ref 26.0–34.0)
MCHC: 26.8 g/dL — ABNORMAL LOW (ref 30.0–36.0)
MCV: 100 fL (ref 80.0–100.0)
Monocytes Absolute: 0.7 10*3/uL (ref 0.1–1.0)
Monocytes Relative: 3 %
Neutro Abs: 4.7 10*3/uL (ref 1.7–7.7)
Neutrophils Relative %: 20 %
Platelets: 193 10*3/uL (ref 150–400)
RBC: 6.34 MIL/uL — ABNORMAL HIGH (ref 3.87–5.11)
RDW: 13.2 % (ref 11.5–15.5)
WBC: 23.8 10*3/uL — ABNORMAL HIGH (ref 4.0–10.5)
nRBC: 0.2 % (ref 0.0–0.2)

## 2019-08-12 LAB — RESPIRATORY PANEL BY RT PCR (FLU A&B, COVID)
Influenza A by PCR: NEGATIVE
Influenza B by PCR: NEGATIVE
SARS Coronavirus 2 by RT PCR: NEGATIVE

## 2019-08-12 LAB — APTT: aPTT: 31 seconds (ref 24–36)

## 2019-08-12 LAB — URINALYSIS, ROUTINE W REFLEX MICROSCOPIC
Bilirubin Urine: NEGATIVE
Glucose, UA: 50 mg/dL — AB
Ketones, ur: NEGATIVE mg/dL
Leukocytes,Ua: NEGATIVE
Nitrite: NEGATIVE
Protein, ur: 100 mg/dL — AB
Specific Gravity, Urine: 1.012 (ref 1.005–1.030)
pH: 8 (ref 5.0–8.0)

## 2019-08-12 LAB — RAPID URINE DRUG SCREEN, HOSP PERFORMED
Amphetamines: NOT DETECTED
Barbiturates: NOT DETECTED
Benzodiazepines: NOT DETECTED
Cocaine: POSITIVE — AB
Opiates: NOT DETECTED
Tetrahydrocannabinol: NOT DETECTED

## 2019-08-12 LAB — TYPE AND SCREEN
ABO/RH(D): O POS
Antibody Screen: NEGATIVE

## 2019-08-12 LAB — TROPONIN I (HIGH SENSITIVITY): Troponin I (High Sensitivity): 40 ng/L — ABNORMAL HIGH (ref <18)

## 2019-08-12 LAB — PROTIME-INR
INR: 1.1 (ref 0.8–1.2)
Prothrombin Time: 13.7 seconds (ref 11.4–15.2)

## 2019-08-12 LAB — CBG MONITORING, ED: Glucose-Capillary: 254 mg/dL — ABNORMAL HIGH (ref 70–99)

## 2019-08-12 MED ORDER — ACETAMINOPHEN 325 MG PO TABS: 650.0000 mg | ORAL_TABLET | Freq: Four times a day (QID) | ORAL | Status: DC | PRN

## 2019-08-12 MED ORDER — LEVETIRACETAM 250 MG PO TABS: 500.0000 mg | ORAL_TABLET | Freq: Two times a day (BID) | ORAL | Status: DC

## 2019-08-12 MED ORDER — SODIUM CHLORIDE 0.9 % IV SOLN
2000.0000 mg | Freq: Once | INTRAVENOUS | Status: AC
  Administered 2019-08-12: 2000 mg via INTRAVENOUS
  Filled 2019-08-12: qty 20

## 2019-08-12 MED ORDER — DIPHENHYDRAMINE HCL 25 MG PO CAPS: 50.0000 mg | ORAL_CAPSULE | Freq: Once | ORAL | Status: DC

## 2019-08-12 MED ORDER — FENTANYL CITRATE (PF) 100 MCG/2ML IJ SOLN
INTRAMUSCULAR | Status: AC
  Filled 2019-08-12: qty 2

## 2019-08-12 MED ORDER — FENTANYL CITRATE (PF) 100 MCG/2ML IJ SOLN: 100.0000 ug | INTRAMUSCULAR | Status: DC | PRN

## 2019-08-12 MED ORDER — ASPIRIN 300 MG RE SUPP
300.0000 mg | RECTAL | Status: AC
  Administered 2019-08-12: 300 mg via RECTAL
  Filled 2019-08-12: qty 1

## 2019-08-12 MED ORDER — LEVETIRACETAM 500 MG PO TABS: 1000.0000 mg | ORAL_TABLET | Freq: Two times a day (BID) | ORAL | Status: DC

## 2019-08-12 MED ORDER — GLYCOPYRROLATE 0.2 MG/ML IJ SOLN: 0.2000 mg | INTRAMUSCULAR | Status: DC | PRN

## 2019-08-12 MED ORDER — SODIUM CHLORIDE 0.9 % IV BOLUS: 1000.0000 mL | Freq: Once | INTRAVENOUS | Status: AC

## 2019-08-12 MED ORDER — FENTANYL CITRATE (PF) 100 MCG/2ML IJ SOLN
50.0000 ug | Freq: Once | INTRAMUSCULAR | Status: AC
  Administered 2019-08-12: 50 ug via INTRAVENOUS

## 2019-08-12 MED ORDER — ROSUVASTATIN CALCIUM 5 MG PO TABS: 10.0000 mg | ORAL_TABLET | Freq: Every day | ORAL | Status: DC

## 2019-08-12 MED ORDER — FLUTICASONE FUROATE-VILANTEROL 200-25 MCG/INH IN AEPB
1.0000 | INHALATION_SPRAY | Freq: Every day | RESPIRATORY_TRACT | Status: DC
  Filled 2019-08-12: qty 28

## 2019-08-12 MED ORDER — ASPIRIN 81 MG PO CHEW: 81.0000 mg | CHEWABLE_TABLET | Freq: Every day | ORAL | Status: DC

## 2019-08-12 MED ORDER — GLYCOPYRROLATE 1 MG PO TABS
1.0000 mg | ORAL_TABLET | ORAL | Status: DC | PRN
  Filled 2019-08-12: qty 1

## 2019-08-12 MED ORDER — FENTANYL 2500MCG IN NS 250ML (10MCG/ML) PREMIX INFUSION
50.0000 ug/h | INTRAVENOUS | Status: DC
  Administered 2019-08-13: 50 ug/h via INTRAVENOUS
  Administered 2019-08-14: 150 ug/h via INTRAVENOUS
  Filled 2019-08-12 (×2): qty 250

## 2019-08-12 MED ORDER — DIPHENHYDRAMINE HCL 50 MG/ML IJ SOLN: 25.0000 mg | INTRAMUSCULAR | Status: DC | PRN

## 2019-08-12 MED ORDER — POLYVINYL ALCOHOL 1.4 % OP SOLN
1.0000 [drp] | Freq: Four times a day (QID) | OPHTHALMIC | Status: DC | PRN
  Filled 2019-08-12: qty 15

## 2019-08-12 MED ORDER — HEPARIN (PORCINE) 25000 UT/250ML-% IV SOLN
1100.0000 [IU]/h | INTRAVENOUS | Status: DC
  Filled 2019-08-12: qty 250

## 2019-08-12 MED ORDER — HYDROCORTISONE NA SUCCINATE PF 250 MG IJ SOLR
200.0000 mg | Freq: Once | INTRAMUSCULAR | Status: AC
  Administered 2019-08-12: 200 mg via INTRAVENOUS
  Filled 2019-08-12: qty 200

## 2019-08-12 MED ORDER — DEXTROSE 5 % IV SOLN: INTRAVENOUS | Status: DC

## 2019-08-12 MED ORDER — PANTOPRAZOLE SODIUM 40 MG IV SOLR: 40.0000 mg | Freq: Every day | INTRAVENOUS | Status: DC

## 2019-08-12 MED ORDER — PROPOFOL 1000 MG/100ML IV EMUL
5.0000 ug/kg/min | INTRAVENOUS | Status: AC
  Administered 2019-08-12: 10 ug/kg/min via INTRAVENOUS
  Administered 2019-08-13: 20 ug/kg/min via INTRAVENOUS
  Administered 2019-08-13 (×2): 30 ug/kg/min via INTRAVENOUS
  Administered 2019-08-14: 35 ug/kg/min via INTRAVENOUS
  Administered 2019-08-14 (×2): 40 ug/kg/min via INTRAVENOUS
  Filled 2019-08-12 (×8): qty 100

## 2019-08-12 MED ORDER — PROPOFOL 1000 MG/100ML IV EMUL
INTRAVENOUS | Status: AC
  Filled 2019-08-12: qty 100

## 2019-08-12 MED ORDER — MIDAZOLAM HCL 2 MG/2ML IJ SOLN
2.0000 mg | INTRAMUSCULAR | Status: DC | PRN
  Administered 2019-08-13 – 2019-08-14 (×3): 2 mg via INTRAVENOUS
  Filled 2019-08-12 (×3): qty 2

## 2019-08-12 MED ORDER — HEPARIN BOLUS VIA INFUSION
4000.0000 [IU] | Freq: Once | INTRAVENOUS | Status: DC
  Filled 2019-08-12: qty 4000

## 2019-08-12 MED ORDER — DIPHENHYDRAMINE HCL 50 MG/ML IJ SOLN
50.0000 mg | Freq: Once | INTRAMUSCULAR | Status: DC
  Filled 2019-08-12: qty 1

## 2019-08-12 MED ORDER — SODIUM CHLORIDE 0.9 % IV BOLUS
1000.0000 mL | Freq: Once | INTRAVENOUS | Status: AC
  Administered 2019-08-12: 1000 mL via INTRAVENOUS

## 2019-08-12 MED ORDER — HYDROCORTISONE NA SUCCINATE PF 100 MG IJ SOLR: 100.0000 mg | Freq: Three times a day (TID) | INTRAMUSCULAR | Status: DC

## 2019-08-12 MED ORDER — METOPROLOL SUCCINATE ER 25 MG PO TB24
25.0000 mg | ORAL_TABLET | Freq: Every day | ORAL | Status: DC
Start: 2019-08-13 — End: 2019-08-12

## 2019-08-12 MED ORDER — ACETAMINOPHEN 650 MG RE SUPP: 650.0000 mg | Freq: Four times a day (QID) | RECTAL | Status: DC | PRN

## 2019-08-12 MED ORDER — SODIUM CHLORIDE 0.9 % IV SOLN: INTRAVENOUS | Status: DC

## 2019-08-12 MED ORDER — ROCURONIUM BROMIDE 10 MG/ML (PF) SYRINGE
PREFILLED_SYRINGE | INTRAVENOUS | Status: AC
  Administered 2019-08-12: 100 mg
  Filled 2019-08-12: qty 10

## 2019-08-12 MED ORDER — FENTANYL BOLUS VIA INFUSION
50.0000 ug | INTRAVENOUS | Status: DC | PRN
  Filled 2019-08-12: qty 50

## 2019-08-12 MED ORDER — FENTANYL CITRATE (PF) 100 MCG/2ML IJ SOLN
25.0000 ug | INTRAMUSCULAR | Status: DC | PRN
  Filled 2019-08-12: qty 2

## 2019-08-12 MED ORDER — ROCURONIUM BROMIDE 50 MG/5ML IV SOLN
100.0000 mg | Freq: Once | INTRAVENOUS | Status: AC
  Filled 2019-08-12: qty 10

## 2019-08-12 MED ORDER — ALBUTEROL SULFATE (2.5 MG/3ML) 0.083% IN NEBU: 2.5000 mg | INHALATION_SOLUTION | Freq: Four times a day (QID) | RESPIRATORY_TRACT | Status: DC | PRN

## 2019-08-12 NOTE — Plan of Care (Signed)
Plan of care  Spoke with patient's Brother Alfy who is next of kin. He has spoken with his other brothers and sisters. There are 6 of them in total. Patients does not have mother, father or children no husband either. They would like to withdraw care. Attempted to speak with friend on file to update and attain collateral information however unable to. Nursing has confirmed this on the phone as well. WE will make patient comfort care and compassionately extubate. They would like body sent to [place]. Will  make this so.

## 2019-08-12 NOTE — H&P (Signed)
NAME:  Diane Gilbert, MRN:  [id number], DOB:  [birth year], LOS: 0 ADMISSION DATE:  08/23/2019, CONSULTATION DATE:  08/26/2019 REFERRING MD:  Rancour , CHIEF COMPLAINT:  Cardiac arrest   Brief History   This is a 54 year old with history as noted below who presents status post cardiac arrest.    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 Recently admitted for fracture of fibula and found to have acute on chronic hypoxic respiratory failure and treated with abx and diuresis. This evening EMS was contacted by unknown bystander. Patient was noted to become unresponsive and slid out of her chair. EMS found patient pulseless and apneic. ACLS initiated.Patient defibrillated x2 for V fib and Vtach. Also 4 doses of EPI given. After which ROSC was achieved. Patient had Edison Pace airway that was placed in the field. Promptly replaced by Ed providers. Patient noted to have blood secretions after OG attanmpes and thus NG placed. EKG attained which showed ST segment elevations in inferior leads. Cardiology consulted. CT head ordered before heparin drip initiated. Cooling initiated in the ED with ice packs.  Spoke with Brother Alfy.Whio is next of kin. Patient with no HCPOA. Alfy noted that last night patient sounded well. She did not note any difficulty breathing. Cough, fever or chest pain. She stated she was doing better.   Past Medical History   has a past medical history of Alcohol abuse, Anxiety, Arthritis, Chronic diastolic CHF (congestive heart failure) ([place]), Chronic respiratory failure with hypoxia and hypercapnia ([place]) (05/09/2017), Community acquired pneumonia, CVA (cerebral vascular accident) ([place]), Depression, Headache(784.0), Hypertension, Insomnia, Respiratory bronchiolitis interstitial lung disease ([place]),  Schizophrenia ([place]), and Seizure ([place]).    Significant Hospital Events   2/13-Cardiac arrest and admit to PCCM  Consults:  Neurology Cardiology  Procedures:  2/13 ETT  Significant Diagnostic Tests:  2/13 CXR>>Bilateral indistinct airspace opacities favored to represent mild pulmonary edema 2/13 CT head>>pending 2/13 CTA chest>>pending  Micro Data:    Antimicrobials:  2/13 BCx2>> 2/13 Respiratory viral panel>>  Interim history/subjective:  Pt intubated in the ED with occasional myoclonic jerking, but otherwise unresponsive.  Objective   Blood pressure (!) 168/92, pulse 73, temperature (!) 95.4 F (35.2 C), resp. rate 18, height _0  (1.676 m), weight 108 kg, last menstrual period 04/06/2017, SpO2 95 %.    Vent Mode: PRVC FiO2 (%):  [100 %] 100 % Set Rate:  [16 bmp] 16 bmp Vt Set:  [470 mL] 470 mL PEEP:  [5 cmH20] 5 cmH20 Plateau Pressure:  [32 cmH20] 32 cmH20  No intake or output data in the 24 hours ending 08/20/2019 2008 Filed Weights   08/25/2019 1900 08/24/2019 1919  Weight: 108 kg 108 kg    General:  AA F, intubated and unresponsive HEENT: MM pink/moist, ETT in place, blood in oral area and NG tube inserted Neuro: Occasional myoclonus of the facial mucles, no spontaneous extremity movement, no gag reflex, no consistent corneal reflex, pupils 65m equal and unresponsive to light CV: s1s2 rrr, no m/r/g PULM:  Distant breath sounds, no wheezing no rhonchi or crackles appreciated GI: soft, bsx4 active  Extremities: warm/dry, trace edema  Skin: no rashes or lesions   Resolved Hospital Problem list     Assessment & Plan:    PEA and Vtach cardiac arrest s/p ROSC with concern for ACS, ST  elevations in inferior leads -s/p at least 10 minutes down time and found apneic and in PEA arrest  -Cardiology consulted by ED, per Dr. Saunders Revel does not think EKG represents STEMI -Poor neurologic status on arrival and intubated in the ED P: -Given asa 367m in the ED, CT head is  pending and if no bleed will start heparin gtt and consider loading with plavix as she is likely a poor candidate for revascularization -follow results of CTA chest -Obtain echocardiogram -Follow troponin -Initiate TTM to 36 degress -Maintain full vent support with SAT/SBT as tolerated -titrate Vent setting to maintain SpO2 greater than or equal to 90%. -HOB elevated 30 degrees. -Plateau pressures less than 30 cm H20.  -Follow chest x-ray, ABG prn.   -Bronchial hygiene and RT/bronchodilator protocol.    History of seizure disorder with concern for seizure like activity -On Keppra 10073mbid at home -myoclonic activity after intubation P: -Neurology consulted, load with Keppra 2g IV and then 50012mid -Obtain EEG -CT head pending    Cocaine Positive -likely contributory to cardiac arrest   Leukocytosis - WBC 23k -per family had been feeling fine yesterday without complaints P: -obtain BC and check UA -afebrile and suspect likely reactive, hold empiric antibiotics for now  HX of RB ILD-on chronic prednisone -Will start stress dose steroids here as picture undifferintiated -Wean oxygen as tolerated -Goals sats 88% and above  Best practice:  Diet: NPO Pain/Anxiety/Delirium protocol (if indicated): propofol/fentanyl VAP protocol (if indicated): yes DVT prophylaxis: SCD's and heparin pending head CT GI prophylaxis: protonix Glucose control: SSI Mobility: bed rest Code Status: full code Family Communication: pt's status discussed with brother Disposition: ICU  Labs   CBC: Recent Labs  Lab 08/21/2019 1902  WBC 23.8*  NEUTROABS 4.7  HGB 17.0*  HCT 63.4*  MCV 100.0  PLT 193161 Basic Metabolic Panel: Recent Labs  Lab 08/17/2019 1902  NA 139  K 4.3  CL 91*  CO2 24  GLUCOSE 241*  BUN 8  CREATININE 1.23*  CALCIUM 9.3   GFR: Estimated Creatinine Clearance: 67.3 mL/min (A) (by C-G formula based on SCr of 1.23 mg/dL (H)). Recent Labs  Lab 08/11/2019 1902    WBC 23.8*    Liver Function Tests: Recent Labs  Lab 08/13/2019 1902  AST 48*  ALT 19  ALKPHOS 116  BILITOT 0.7  PROT 7.4  ALBUMIN 3.5   No results for input(s): LIPASE, AMYLASE in the last 168 hours. No results for input(s): AMMONIA in the last 168 hours.  ABG    Component Value Date/Time   PHART 7.412 07/06/2019 2250   PCO2ART 65.4 (HH) 07/06/2019 2250   PO2ART 75.1 (L) 07/06/2019 2250   HCO3 41.0 (H) 07/06/2019 2250   TCO2 >50 (H) 07/05/2019 1030   O2SAT 94.9 07/06/2019 2250     Coagulation Profile: Recent Labs  Lab 07/31/2019 1902  INR 1.1    Cardiac Enzymes: No results for input(s): CKTOTAL, CKMB, CKMBINDEX, TROPONINI in the last 168 hours.  HbA1C: Hgb A1c MFr Bld  Date/Time Value Ref Range Status  07/04/2019 05:40 AM 4.8 4.8 - 5.6 % Final    Comment:    (NOTE)         Prediabetes: 5.7 - 6.4         Diabetes: >6.4         Glycemic control for adults with diabetes: <7.0   03/30/2019 02:42 AM 6.0 (H) 4.8 - 5.6 % Final    Comment:    (  NOTE) Pre diabetes:          5.7%-6.4% Diabetes:              >6.4% Glycemic control for   <7.0% adults with diabetes     CBG: Recent Labs  Lab 08/17/2019 1852  GLUCAP 254*    Review of Systems:   Unable to attain as patient intubated. No meaningful symptoms endorsed by brother Alfy  Past Medical History  She,  has a past medical history of Alcohol abuse, Anxiety, Arthritis, Chronic diastolic CHF (congestive heart failure) ([place]), Chronic respiratory failure with hypoxia and hypercapnia ([place]) (05/09/2017), Community acquired pneumonia, CVA (cerebral vascular accident) ([place]), Depression, Headache(784.0), Hypertension, Insomnia, Respiratory bronchiolitis interstitial lung disease ([place]), Schizophrenia ([place]), and Seizure ([place]).   Surgical History    Past Surgical History:  Procedure Laterality Date  . BACK SURGERY    . LUNG BIOPSY Left 07/16/2014   Procedure: LUNG BIOPSY;  Surgeon: Grace Isaac, MD;  Location: [facility] [facility];  Service: Thoracic;  Laterality: Left;  Marland Kitchen MULTIPLE TOOTH EXTRACTIONS    . RIGHT/LEFT HEART CATH AND CORONARY ANGIOGRAPHY N/A 01/04/2018   Procedure: RIGHT/LEFT HEART CATH AND CORONARY ANGIOGRAPHY;  Surgeon: Adrian Prows, MD;  Location: [facility];  Service: Cardiovascular;  Laterality: N/A;  . VIDEO ASSISTED THORACOSCOPY Left 07/16/2014   Procedure: VIDEO ASSISTED THORACOSCOPY;  Surgeon: Grace Isaac, MD;  Location: [facility];  Service: Thoracic;  Laterality: Left;  Marland Kitchen VIDEO BRONCHOSCOPY Bilateral 04/11/2014   Procedure: VIDEO BRONCHOSCOPY WITH FLUORO;  Surgeon: Kathee Delton, MD;  Location: [facility];  Service: Cardiopulmonary;  Laterality: Bilateral;  . VIDEO BRONCHOSCOPY N/A 07/16/2014   Procedure: VIDEO BRONCHOSCOPY;  Surgeon: Grace Isaac, MD;  Location: [facility] OR;  Service: Thoracic;  Laterality: N/A;  . WEDGE RESECTION Left 07/16/2014   Procedure: WEDGE RESECTION;  Surgeon: Grace Isaac, MD;  Location: [place];  Service: Thoracic;  Laterality: Left;  left upper lobe lung resection     Social History   reports that she has been smoking cigarettes. She has a 45.00 pack-year smoking history. She has never used smokeless tobacco. She reports current alcohol use. She reports current drug use. Drug: Cocaine.   Family History   Her family history includes Breast cancer in her mother; COPD in her maternal aunt; Obstructive Sleep Apnea in her mother.   Allergies Allergies  Allergen Reactions  . Unasyn [Ampicillin-Sulbactam Sodium] Swelling and Rash    angioedema  . Contrast Media [Iodinated Diagnostic Agents] Nausea And Vomiting  . Hydralazine Swelling     Home Medications  Prior to Admission medications   Medication Sig Start Date End Date Taking? Authorizing Provider  albuterol (PROVENTIL) (2.5 MG/3ML) 0.083% nebulizer solution Take 3 mLs (2.5 mg total) by nebulization every 6 (six) hours as needed for wheezing or shortness of breath. 02/06/19   Martyn Ehrich, NP    albuterol (VENTOLIN HFA) 108 (90 Base) MCG/ACT inhaler Inhale 2 puffs into the lungs every 6 (six) hours as needed for wheezing or shortness of breath. 07/26/19   Icard, Leory Plowman L, DO  ARIPiprazole (ABILIFY) 5 MG tablet Take 5 mg by mouth daily. 02/23/19   [provider]  aspirin 81 MG chewable tablet Chew 1 tablet (81 mg total) by mouth daily. 07/12/19   Caren Griffins, MD  budesonide-formoterol (SYMBICORT) 160-4.5 MCG/ACT inhaler INHALE TWO puffs into THE lungs TWICE DAILY 07/26/19   Icard, Octavio Graves, DO  buPROPion (WELLBUTRIN SR) 150 MG 12 hr tablet Take  150 mg by mouth daily. 06/19/19   [provider]  diclofenac sodium (VOLTAREN) 1 % GEL Apply 2 g topically 4 (four) times daily.    [provider]  FLUoxetine (PROZAC) 20 MG capsule Take 20 mg by mouth daily. 06/06/19   [provider]  furosemide (LASIX) 40 MG tablet Take 1 tablet (40 mg total) by mouth 2 (two) times daily. 03/12/19   Lavina Hamman, MD  gabapentin (NEURONTIN) 300 MG capsule Take 300 mg by mouth 3 (three) times daily.    [provider]  levETIRAcetam (KEPPRA) 1000 MG tablet Take 1 tablet (1,000 mg total) by mouth 2 (two) times daily. 04/03/19   Harold Hedge, MD  levofloxacin (LEVAQUIN) 750 MG tablet Take 1 tablet (750 mg total) by mouth daily. Patient not taking: Reported on 07/26/2019 07/12/19   Caren Griffins, MD  metoprolol succinate (TOPROL-XL) 25 MG 24 hr tablet Take 25 mg by mouth daily. 06/06/19   [provider]  OXYGEN Inhale 4 L into the lungs continuous.     [provider]  QUEtiapine (SEROQUEL) 100 MG tablet Take 1 tablet (100 mg total) by mouth 2 (two) times daily. 04/03/19   Harold Hedge, MD  rosuvastatin (CRESTOR) 10 MG tablet Take 1 tablet (10 mg total) by mouth daily. 01/17/18   Blanchie Dessert, MD  tiZANidine (ZANAFLEX) 4 MG capsule Take 4 mg by mouth 2 (two) times daily as needed for muscle spasms.     [provider]  traZODone  (DESYREL) 100 MG tablet Take 100 mg by mouth at bedtime.    [provider]     Critical care time: 51

## 2019-08-12 NOTE — Procedures (Signed)
Arterial Catheter Insertion Procedure Note Diane Gilbert [id number] 04-18-1966  Procedure: Insertion of Arterial Catheter  Indications: Blood pressure monitoring  Procedure Details Consent: Unable to obtain consent because of emergent medical necessity. Time Out: Verified patient identification, verified procedure, site/side was marked, verified correct patient position, special equipment/implants available, medications/allergies/relevent history reviewed, required imaging and test results available.  Performed  Maximum sterile technique was used including antiseptics, gloves, gown, hand hygiene, mask and sheet. Skin prep: Chlorhexidine; local anesthetic administered 20 gauge catheter was inserted into left radial artery using the Seldinger technique. ULTRASOUND GUIDANCE USED: NO Evaluation Blood flow good; BP tracing good. Complications: No apparent complications.   Romeo Apple 08/23/2019

## 2019-08-12 NOTE — Progress Notes (Signed)
ANTICOAGULATION CONSULT NOTE - Initial Consult  Pharmacy Consult for heparin Indication: chest pain/ACS  Allergies  Allergen Reactions  . Unasyn [Ampicillin-Sulbactam Sodium] Swelling and Rash    angioedema  . Contrast Media [Iodinated Diagnostic Agents] Nausea And Vomiting  . Hydralazine Swelling    Patient Measurements: Height: _0  (167.6 cm) Weight: 238 lb 1.6 oz (108 kg) IBW/kg (Calculated) : 59.3 Heparin Dosing Weight: 84kg  Vital Signs: Temp: 96.9 F (36.1 C) (02/13 2100) Temp Source: Temporal (02/13 1917) BP: 157/91 (02/13 2015) Pulse Rate: 73 (02/13 1945)  Labs: Recent Labs    08/23/2019 1902 08/16/2019 2019  HGB 17.0* 19.7*  HCT 63.4* 58.0*  PLT 193  --   APTT 31  --   LABPROT 13.7  --   INR 1.1  --   CREATININE 1.23*  --     Estimated Creatinine Clearance: 67.3 mL/min (A) (by C-G formula based on SCr of 1.23 mg/dL (H)).   Medical History: Past Medical History:  Diagnosis Date  . Alcohol abuse   . Anxiety   . Arthritis   . Chronic diastolic CHF (congestive heart failure) ([place])   . Chronic respiratory failure with hypoxia and hypercapnia ([place]) 05/09/2017  . Community acquired pneumonia   . CVA (cerebral vascular accident) ([place])   . Depression   . Headache(784.0)   . Hypertension   . Insomnia   . Respiratory bronchiolitis interstitial lung disease ([place])   . Schizophrenia ([place])   . Seizure [facility])      Assessment: 33 yoF admitted with PEA arrest. Pharmacy asked to start heparin with concern for ACS. CBC and coags wnl on admit, no AC noted PTA, CT head negative bleed. Chest CT pending.  Goal of Therapy:  Heparin level 0.3-0.7 units/ml Monitor platelets by anticoagulation protocol: Yes   Plan:  -Heparin 4000 units x1 -Heparin 1100 units/hr -Check 6-hr heparin level -Monitor heparin level, CBC, S/Sx bleeding daily   Arrie Senate, PharmD, BCPS Clinical Pharmacist [phone] Please check AMION for all [place] numbers 08/19/2019

## 2019-08-12 NOTE — ED Provider Notes (Signed)
[place] EMERGENCY DEPARTMENT Provider Note   CSN: [id number] Arrival date & time: 08/24/2019  1848     History Chief Complaint  Patient presents with  . Post CPR    Diane Gilbert is a 54 y.o. female.  Level 5 caveat for unresponsiveness.  Patient brought in post CPR.  She had a witnessed arrest at home.  Apparently downtime was 10 minutes.  Did not receive bystander CPR.  On EMS arrival she was in PEA.  Received CPR with 4 doses of epinephrine.  Return of pulses at [place] after loss of pulses at 1802.  Did receive 2 shocks for apparent VT versus VF.  She arrives with pulses intact and on epinephrine drip.  She is unresponsive.  No reported head trauma.  Does have a history of alcohol abuse, CHF, chronic respiratory failure on oxygen, schizophrenia and previous seizure. No family available. Patient unresponsive and not able to give a history.  The history is provided by the patient and the EMS personnel. The history is limited by the condition of the patient.       Past Medical History:  Diagnosis Date  . Alcohol abuse   . Anxiety   . Arthritis   . Chronic diastolic CHF (congestive heart failure) ([place])   . Chronic respiratory failure with hypoxia and hypercapnia ([place]) 05/09/2017  . Community acquired pneumonia   . CVA (cerebral vascular accident) ([place])   . Depression   . Headache(784.0)   . Hypertension   . Insomnia   . Respiratory bronchiolitis interstitial lung disease ([place])   . Schizophrenia ([place])   . Seizure [facility])     Patient Active Problem List   Diagnosis Date Noted  . Community acquired pneumonia of right lower lobe of lung 07/26/2019  . Tobacco abuse 07/26/2019  . ARF (acute renal failure) ([place]) 07/08/2019  . Acute metabolic encephalopathy 36/11/7701  . Acute on chronic diastolic heart failure ([place])   . History of CVA (cerebrovascular accident)   . Seizure disorder ([place])   . Acute respiratory failure with hypoxia and hypercapnia  ([place]) 07/03/2019  . Hx of sleep apnea 03/29/2019  . Status epilepticus ([place]) 03/29/2019  . Acute diastolic CHF (congestive heart failure) ([place]) 03/08/2019  . Acute encephalopathy 03/07/2019  . Ischemic stroke ([place]) 03/07/2019  . Acute respiratory failure with hypoxia ([place]) 02/26/2019  . Falls 02/06/2019  . Depression 02/06/2019  . Acute on chronic diastolic CHF (congestive heart failure) ([place]) 05/09/2017  . Acute on chronic respiratory failure with hypoxia and hypercapnia ([place]) 05/09/2017  . Facial swelling 03/23/2017  . Chronic respiratory failure with hypoxia ([place]) 03/23/2017  . Community acquired pneumonia   . OSA (obstructive sleep apnea) 07/21/2016  . Acute diastolic congestive heart failure ([place])   . Schizophrenia ([place]) 03/24/2016  . Leg swelling 01/16/2016  . Interstitial lung disease ([place]) 07/16/2014  . Respiratory bronchiolitis interstitial lung disease ([place]) 12/26/2013  . Irregular menstrual cycle 01/23/2013  . Smoking addiction 01/23/2013  . Alcohol abuse 02/19/2012  . Hyponatremia 02/19/2012  . Abdominal pain 02/19/2012  . Hypokalemia 02/19/2012    Past Surgical History:  Procedure Laterality Date  . BACK SURGERY    . LUNG BIOPSY Left 07/16/2014   Procedure: LUNG BIOPSY;  Surgeon: Grace Isaac, MD;  Location: [place];  Service: Thoracic;  Laterality: Left;  Marland Kitchen MULTIPLE TOOTH EXTRACTIONS    . RIGHT/LEFT HEART CATH AND CORONARY ANGIOGRAPHY N/A 01/04/2018   Procedure: RIGHT/LEFT HEART CATH AND CORONARY ANGIOGRAPHY;  Surgeon: Adrian Prows,  MD;  Location: [facility] CV [facility];  Service: Cardiovascular;  Laterality: N/A;  . VIDEO ASSISTED THORACOSCOPY Left 07/16/2014   Procedure: VIDEO ASSISTED THORACOSCOPY;  Surgeon: Grace Isaac, MD;  Location: [place];  Service: Thoracic;  Laterality: Left;  Marland Kitchen VIDEO BRONCHOSCOPY Bilateral 04/11/2014   Procedure: VIDEO BRONCHOSCOPY WITH FLUORO;  Surgeon: Kathee Delton, MD;  Location: [facility];  Service: Cardiopulmonary;  Laterality:  Bilateral;  . VIDEO BRONCHOSCOPY N/A 07/16/2014   Procedure: VIDEO BRONCHOSCOPY;  Surgeon: Grace Isaac, MD;  Location: [facility] OR;  Service: Thoracic;  Laterality: N/A;  . WEDGE RESECTION Left 07/16/2014   Procedure: WEDGE RESECTION;  Surgeon: Grace Isaac, MD;  Location: [place];  Service: Thoracic;  Laterality: Left;  left upper lobe lung resection     OB History    Gravida  0   Para  0   Term  0   Preterm  0   AB  0   Living  0     SAB  0   TAB  0   Ectopic  0   Multiple  0   Live Births              Family History  Problem Relation Age of Onset  . Breast cancer Mother   . Obstructive Sleep Apnea Mother   . COPD Maternal Aunt     Social History   Tobacco Use  . Smoking status: Current Every Day Smoker    Packs/day: 1.50    Years: 30.00    Pack years: 45.00    Types: Cigarettes  . Smokeless tobacco: Never Used  . Tobacco comment: smoking .5ppd as of 07/26/19  Substance Use Topics  . Alcohol use: Yes    Alcohol/week: 0.0 standard drinks    Comment: occasional   . Drug use: Yes    Types: Cocaine    Home Medications Prior to Admission medications   Medication Sig Start Date End Date Taking? Authorizing Provider  albuterol (PROVENTIL) (2.5 MG/3ML) 0.083% nebulizer solution Take 3 mLs (2.5 mg total) by nebulization every 6 (six) hours as needed for wheezing or shortness of breath. 02/06/19   Martyn Ehrich, NP  albuterol (VENTOLIN HFA) 108 (90 Base) MCG/ACT inhaler Inhale 2 puffs into the lungs every 6 (six) hours as needed for wheezing or shortness of breath. 07/26/19   Icard, Leory Plowman L, DO  ARIPiprazole (ABILIFY) 5 MG tablet Take 5 mg by mouth daily. 02/23/19   [provider]  aspirin 81 MG chewable tablet Chew 1 tablet (81 mg total) by mouth daily. 07/12/19   Caren Griffins, MD  budesonide-formoterol (SYMBICORT) 160-4.5 MCG/ACT inhaler INHALE TWO puffs into THE lungs TWICE DAILY 07/26/19   Icard, Octavio Graves, DO  buPROPion (WELLBUTRIN  SR) 150 MG 12 hr tablet Take 150 mg by mouth daily. 06/19/19   [provider]  diclofenac sodium (VOLTAREN) 1 % GEL Apply 2 g topically 4 (four) times daily.    [provider]  FLUoxetine (PROZAC) 20 MG capsule Take 20 mg by mouth daily. 06/06/19   [provider]  furosemide (LASIX) 40 MG tablet Take 1 tablet (40 mg total) by mouth 2 (two) times daily. 03/12/19   Lavina Hamman, MD  gabapentin (NEURONTIN) 300 MG capsule Take 300 mg by mouth 3 (three) times daily.    [provider]  levETIRAcetam (KEPPRA) 1000 MG tablet Take 1 tablet (1,000 mg total) by mouth 2 (two) times daily. 04/03/19   Marva Panda  E, MD  levofloxacin (LEVAQUIN) 750 MG tablet Take 1 tablet (750 mg total) by mouth daily. Patient not taking: Reported on 07/26/2019 07/12/19   Caren Griffins, MD  metoprolol succinate (TOPROL-XL) 25 MG 24 hr tablet Take 25 mg by mouth daily. 06/06/19   [provider]  OXYGEN Inhale 4 L into the lungs continuous.     [provider]  QUEtiapine (SEROQUEL) 100 MG tablet Take 1 tablet (100 mg total) by mouth 2 (two) times daily. 04/03/19   Harold Hedge, MD  rosuvastatin (CRESTOR) 10 MG tablet Take 1 tablet (10 mg total) by mouth daily. 01/17/18   Blanchie Dessert, MD  tiZANidine (ZANAFLEX) 4 MG capsule Take 4 mg by mouth 2 (two) times daily as needed for muscle spasms.     [provider]  traZODone (DESYREL) 100 MG tablet Take 100 mg by mouth at bedtime.    [provider]    Allergies    Unasyn [ampicillin-sulbactam sodium], Contrast media [iodinated diagnostic agents], and Hydralazine  Review of Systems   Review of Systems  Unable to perform ROS: Acuity of condition    Physical Exam Updated Vital Signs Ht _0  (1.676 m)   Wt 108 kg   LMP 04/06/2017   SpO2 (P) 95%   BMI 38.43 kg/m   Physical Exam Constitutional:      Appearance: She is obese.     Comments: Unresponsive, no movement  HENT:      Mouth/Throat:     Comments: Blood coming from nose and mouth Eyes:     Comments: Fixed and dilated pupils  Cardiovascular:     Rate and Rhythm: Normal rate.  Pulmonary:     Effort: No respiratory distress.     Breath sounds: Normal breath sounds. No wheezing.     Comments: Equal breath sounds with bagging Chest:     Chest wall: No tenderness.  Abdominal:     General: There is distension.     Comments: Obese, distended abdomen  Skin:    General: Skin is warm.  Neurological:     Comments: Unresponsive does not respond to pain GCS 3     ED Results / Procedures / Treatments   Labs (all labs ordered are listed, but only abnormal results are displayed) Labs Reviewed  LACTIC ACID, PLASMA - Abnormal; Notable for the following components:      Result Value   Lactic Acid, Venous >11.0 (*)    All other components within normal limits  CBC WITH DIFFERENTIAL/PLATELET - Abnormal; Notable for the following components:   WBC 23.8 (*)    RBC 6.34 (*)    Hemoglobin 17.0 (*)    HCT 63.4 (*)    MCHC 26.8 (*)    Lymphs Abs 17.7 (*)    Abs Immature Granulocytes 0.28 (*)    All other components within normal limits  COMPREHENSIVE METABOLIC PANEL - Abnormal; Notable for the following components:   Chloride 91 (*)    Glucose, Bld 241 (*)    Creatinine, Ser 1.23 (*)    AST 48 (*)    GFR calc non Af Amer 51 (*)    GFR calc Af Amer 59 (*)    Anion gap 24 (*)    All other components within normal limits  RAPID URINE DRUG SCREEN, HOSP PERFORMED - Abnormal; Notable for the following components:   Cocaine POSITIVE (*)    All other components within normal limits  URINALYSIS, ROUTINE W REFLEX MICROSCOPIC - Abnormal;  Notable for the following components:   APPearance HAZY (*)    Glucose, UA 50 (*)    Hgb urine dipstick SMALL (*)    Protein, ur 100 (*)    Bacteria, UA RARE (*)    All other components within normal limits  CBG MONITORING, ED - Abnormal; Notable for the following components:    Glucose-Capillary 254 (*)    All other components within normal limits  POCT I-STAT 7, (LYTES, BLD GAS, ICA,H+H) - Abnormal; Notable for the following components:   pH, Arterial 7.201 (*)    pCO2 arterial 82.6 (*)    Bicarbonate 32.8 (*)    TCO2 35 (*)    Calcium, Ion 1.12 (*)    HCT 58.0 (*)    Hemoglobin 19.7 (*)    All other components within normal limits  TROPONIN I (HIGH SENSITIVITY) - Abnormal; Notable for the following components:   Troponin I (High Sensitivity) 40 (*)    All other components within normal limits  RESPIRATORY PANEL BY RT PCR (FLU A&B, COVID)  CULTURE, BLOOD (ROUTINE X 2)  CULTURE, BLOOD (ROUTINE X 2)  MRSA PCR SCREENING  PROTIME-INR  APTT  I-STAT ARTERIAL BLOOD GAS, ED  I-STAT CHEM 8, ED  CBG MONITORING, ED  TYPE AND SCREEN    EKG EKG Interpretation  Date/Time:  Saturday August 12 2019 19:07:40 EST Ventricular Rate:  79 PR Interval:    QRS Duration: 101 QT Interval:  408 QTC Calculation: 468 R Axis:   7 Text Interpretation: Sinus rhythm Biatrial enlargement Low voltage, extremity leads LVH with secondary repolarization abnormality Anterior Q waves, possibly due to LVH minimal ST elevation I and aVL Confirmed by Ezequiel Essex [phone]) on 08/10/2019 7:10:50 PM   Radiology CT Head Wo Contrast  Result Date: 08/13/2019 CLINICAL DATA:  Encephalopathy found unresponsive and sliding out of chair at home EXAM: CT HEAD WITHOUT CONTRAST TECHNIQUE: Contiguous axial images were obtained from the base of the skull through the vertex without intravenous contrast. COMPARISON:  MRI 04/02/2019 FINDINGS: Brain: Stable regions of gliosis in the left occipital lobe and bilateral globus pallidi. No new areas of CT evident infarct. No evidence of acute hemorrhage, hydrocephalus, extra-axial collection or mass lesion/mass effect. Patchy areas of white matter hypoattenuation are most compatible with chronic microvascular angiopathy. Vascular: Atherosclerotic calcification  of the carotid siphons. No hyperdense vessel. Skull: No scalp swelling or hematoma. Benign dermal calcifications are similar to priors. No suspicious scalp lesions. No worrisome osseous lesions. Sinuses/Orbits: Transnasal and endotracheal tubes are in place at the time of exam. Pneumatized secretions throughout the ethmoids are likely related to instrumentation. Remaining paranasal sinuses and mastoid air cells are predominantly clear. Included orbital structures are unremarkable. Other: None IMPRESSION: 1. No acute intracranial findings. 2. Stable regions of gliosis in the left occipital lobe and bilateral globus pallidi. 3. Chronic volume loss and white matter angiopathy. Electronically Signed   By: Lovena Le M.D.   On: 08/16/2019 20:57   DG Chest Portable 1 View  Result Date: 08/07/2019 CLINICAL DATA:  ET tube inserted, check tip position. Post CPR EXAM: PORTABLE CHEST 1 VIEW COMPARISON:  Chest radiograph 07/04/2019 FINDINGS: Interval intubation with endotracheal tube tip between the thoracic inlet and carina. The nasogastric tube courses below the diaphragm with distal tip out of field of view. Stable cardiomediastinal contours with enlarged heart size. There are bilateral indistinct airspace opacities favored to represent mild edema. No evidence of pneumothorax or large pleural effusion. IMPRESSION: 1. Interval intubation with endotracheal tube  tip between the thoracic inlet and carina. The nasogastric tube courses below the diaphragm with distal aspect out of field of view. 2. Bilateral indistinct airspace opacities favored to represent mild pulmonary edema. Electronically Signed   By: Audie Pinto M.D.   On: 08/01/2019 19:09    Procedures Procedure Name: Intubation Date/Time: 08/23/2019 7:16 PM Performed by: Ezequiel Essex, MD Pre-anesthesia Checklist: Patient identified, Patient being monitored, Emergency Drugs available, Timeout performed and Suction available Oxygen Delivery Method:  Non-rebreather mask Preoxygenation: Pre-oxygenation with 100% oxygen Ventilation: Mask ventilation without difficulty and Mask ventilation with difficulty Laryngoscope Size: Glidescope, 4 and Mac Grade View: Grade II Tube type: Subglottic suction tube Tube size: 7.5 mm Number of attempts: 1 Airway Equipment and Method: Video-laryngoscopy and Rigid stylet Placement Confirmation: ETT inserted through vocal cords under direct vision,  CO2 detector and Breath sounds checked- equal and bilateral Secured at: 25 cm Tube secured with: ETT holder Dental Injury: Teeth and Oropharynx as per pre-operative assessment  Difficulty Due To: Difficulty was anticipated, Difficult Airway- due to large tongue, Difficult Airway- due to limited oral opening and Difficult Airway-  due to edematous airway Future Recommendations: Recommend- induction with short-acting agent, and alternative techniques readily available     .Critical Care Performed by: Ezequiel Essex, MD Authorized by: Ezequiel Essex, MD   Critical care provider statement:    Critical care time (minutes):  60   Critical care was necessary to treat or prevent imminent or life-threatening deterioration of the following conditions:  Circulatory failure, respiratory failure and cardiac failure   Critical care was time spent personally by me on the following activities:  Discussions with consultants, evaluation of patient's response to treatment, examination of patient, ordering and performing treatments and interventions, ordering and review of laboratory studies, ordering and review of radiographic studies, pulse oximetry, re-evaluation of patient's condition, obtaining history from patient or surrogate and review of old charts   (including critical care time)  Medications Ordered in ED Medications  propofol (DIPRIVAN) 1000 MG/100ML infusion (has no administration in time range)  propofol (DIPRIVAN) 1000 MG/100ML infusion (has no  administration in time range)  aspirin suppository 300 mg (has no administration in time range)  sodium chloride 0.9 % bolus 1,000 mL (has no administration in time range)  fentaNYL (SUBLIMAZE) injection 100 mcg (has no administration in time range)  fentaNYL (SUBLIMAZE) injection 100 mcg (has no administration in time range)    ED Course  I have reviewed the triage vital signs and the nursing notes.  Pertinent labs & imaging results that were available during my care of the patient were reviewed by me and considered in my medical decision making (see chart for details).    MDM Rules/Calculators/A&P                     Postarrest brought in with return of spontaneous circulation.   Serial EKGs do not show acute ST elevation.  There is minimal elevation in lead I and aVL.  This is reviewed with Dr. Saunders Revel of cardiology who agrees does not represent STEMI. He does not feel she would benefit from a trip to the catheterization lab  [facility] airway exchanged for endotracheal tube. Epinephrine drip stopped given her hypertension.  Labs and chest x-ray ABG will be obtained.  X-ray shows evidence of pulmonary edema. Patient with was apparently recently treated for pneumonia and hypercarbic and hypoxic respiratory failure.  ABG shows respiratory acidosis with CO2 retention and vent adjustments were made. CT pulmonary  embolism will be obtained but she will need pretreatment for IV contrast. CT head is negative.  Patient remains minimally responsive without need for sedation. Concern for anoxic brain injury.  Admission discussed with critical care team as well as cardiology team. Cooling protocol initiated.  Diane Gilbert was evaluated in Emergency Department on 08/13/2019 for the symptoms described in the history of present illness. She was evaluated in the context of the global COVID-19 pandemic, which necessitated consideration that the patient might be at risk for infection with the  SARS-CoV-2 virus that causes COVID-19. Institutional protocols and algorithms that pertain to the evaluation of patients at risk for COVID-19 are in a state of rapid change based on information released by regulatory bodies including the CDC and federal and state organizations. These policies and algorithms were followed during the patient's care in the ED.      Final Clinical Impression(s) / ED Diagnoses Final diagnoses:  Cardiac arrest [facility])    Rx / DC Orders ED Discharge Orders    None       Irini Leet, Annie Main, MD 08/13/19 [phone]

## 2019-08-12 NOTE — Significant Event (Signed)
[facility] STEMI Evaluation Note  Date: 08/08/2019 Time: 9:22 PM  I was contacted by Dr. Wyvonnia Dusky regarding possible STEMI for Ms. Marvel Plan.  History obtained from the ED team.  She reportedly had witnessed cardiac arrest without bystander CPR.  EMS arrived about 10 minutes after initial arrest and found the patient to be in PEA.  She eventually had a shockable rhythm and was defibrillated x 2.  ROSC was achieved 35-40 minutes after EMS arrived and CPR was first started.  She has remained unresponsive without purposeful movements.  On my evaluation, she is intubated and unresponsive.  Heart sounds are regular without murmurs.  Lungs are clear anteriorly.  There is no edema.  EKG's at [facility] were personally reviewed and show sinus rhythm with non-specific ST changes.  They do not meet STEMI criteria.  The patient has a history of severe lung disease and pulmonary hypertension followed by Dr. Einar Gip.  Catheterization in 2019 showed no significant CAD.  Given extended period of pulselessness (45-50 minutes), initial rhythm of PEA, and poor neurologic status, as well as EKG's not meeting STEMI criteria, I have advised against emergent cardiac catheterization.  As the patient is followed by Dr. Einar Gip, I have spoken with his partner, Dr. Virgina Jock, who will assume ongoing cardiology care for Ms. Prophete and will provide formal consultation in the morning.  Nelva Bush, MD [facility]

## 2019-08-12 NOTE — ED Notes (Signed)
CCM at bedside 

## 2019-08-12 NOTE — ED Notes (Signed)
RT at bedside for art line placement

## 2019-08-12 NOTE — Consult Note (Signed)
Neurology Consultation  Reason for Consult: Anoxic brain injury, myoclonic jerking Referring Physician: Dr. Orpah Melter - PCCM  CC: Post cardiac arrest, myoclonic jerking  History is obtained from: Chart review  HPI: Diane Gilbert is a 54 y.o. female past medical history of alcohol abuse, prior PCA stroke, seizures and status epilepticus in October 2020, depression, headache, CHF, schizophrenia, ILD on home O2,  brought in post PEA cardiac arrest. She was found down pulseless for at least 10 min, then EMS arrived and then CPR done for about 40 min prior to ROSC. EKG with ST elevations in inferior leads. Heparin drip started. She was noted to be exhibiting myoclonic jerking movements of whole face and body. Neurology was consulted for further management and recommendations. Patient unable to provide history at this time.   ROS: Unable to obtain due to altered mental status.   Past Medical History:  Diagnosis Date  . Alcohol abuse   . Anxiety   . Arthritis   . Chronic diastolic CHF (congestive heart failure) ([place])   . Chronic respiratory failure with hypoxia and hypercapnia ([place]) 05/09/2017  . Community acquired pneumonia   . CVA (cerebral vascular accident) ([place])   . Depression   . Headache(784.0)   . Hypertension   . Insomnia   . Respiratory bronchiolitis interstitial lung disease ([place])   . Schizophrenia ([place])   . Seizure [facility])     Family History  Problem Relation Age of Onset  . Breast cancer Mother   . Obstructive Sleep Apnea Mother   . COPD Maternal Aunt     Social History:   reports that she has been smoking cigarettes. She has a 45.00 pack-year smoking history. She has never used smokeless tobacco. She reports current alcohol use. She reports current drug use. Drug: Cocaine.   Medications  Current Facility-Administered Medications:  .  0.9 %  sodium chloride infusion, , Intravenous, Continuous, Gleason, Otilio Carpen, PA-C .  albuterol (PROVENTIL) (2.5 MG/3ML)  0.083% nebulizer solution 2.5 mg, 2.5 mg, Inhalation, Q6H PRN, Gleason, Otilio Carpen, PA-C .  [START ON 08/13/2019] aspirin chewable tablet 81 mg, 81 mg, Oral, Daily, Gleason, Otilio Carpen, PA-C .  [START ON 08/13/2019] fluticasone furoate-vilanterol (BREO ELLIPTA) 200-25 MCG/INH 1 puff, 1 puff, Inhalation, Daily, Gleason, Otilio Carpen, PA-C .  [START ON 08/13/2019] hydrocortisone sodium succinate (SOLU-CORTEF) 100 MG injection 100 mg, 100 mg, Intravenous, Q8H, Gleason, Otilio Carpen, PA-C .  levETIRAcetam (KEPPRA) 2,000 mg in sodium chloride 0.9 % 250 mL IVPB, 2,000 mg, Intravenous, Once, Gleason, Otilio Carpen, PA-C .  [START ON 08/13/2019] levETIRAcetam (KEPPRA) tablet 500 mg, 500 mg, Oral, BID, Gleason, Otilio Carpen, PA-C .  pantoprazole (PROTONIX) injection 40 mg, 40 mg, Intravenous, QHS, Gleason, Otilio Carpen, PA-C .  propofol (DIPRIVAN) 1000 MG/100ML infusion, , , ,  .  propofol (DIPRIVAN) 1000 MG/100ML infusion, 5-80 mcg/kg/min, Intravenous, Continuous, Gleason, Otilio Carpen, PA-C, Last Rate: 6.48 mL/hr at 08/04/2019 1939, 10 mcg/kg/min at 08/25/2019 1939 .  sodium chloride 0.9 % bolus 1,000 mL, 1,000 mL, Intravenous, Once, Gleason, Otilio Carpen, PA-C  Current Outpatient Medications:  .  albuterol (PROVENTIL) (2.5 MG/3ML) 0.083% nebulizer solution, Take 3 mLs (2.5 mg total) by nebulization every 6 (six) hours as needed for wheezing or shortness of breath., Disp: 75 mL, Rfl: 2 .  albuterol (VENTOLIN HFA) 108 (90 Base) MCG/ACT inhaler, Inhale 2 puffs into the lungs every 6 (six) hours as needed for wheezing or shortness of breath., Disp: 18 g, Rfl: 11 .  ARIPiprazole (ABILIFY) 5 MG  tablet, Take 5 mg by mouth daily., Disp: , Rfl:  .  aspirin 81 MG chewable tablet, Chew 1 tablet (81 mg total) by mouth daily., Disp: 30 tablet, Rfl: 0 .  budesonide-formoterol (SYMBICORT) 160-4.5 MCG/ACT inhaler, INHALE TWO puffs into THE lungs TWICE DAILY, Disp: 10.2 g, Rfl: 5 .  buPROPion (WELLBUTRIN SR) 150 MG 12 hr tablet, Take 150 mg by mouth daily., Disp: , Rfl:   .  diclofenac sodium (VOLTAREN) 1 % GEL, Apply 2 g topically 4 (four) times daily., Disp: , Rfl:  .  FLUoxetine (PROZAC) 20 MG capsule, Take 20 mg by mouth daily., Disp: , Rfl:  .  furosemide (LASIX) 40 MG tablet, Take 1 tablet (40 mg total) by mouth 2 (two) times daily., Disp: 30 tablet, Rfl: 0 .  gabapentin (NEURONTIN) 300 MG capsule, Take 300 mg by mouth 3 (three) times daily., Disp: , Rfl:  .  levETIRAcetam (KEPPRA) 1000 MG tablet, Take 1 tablet (1,000 mg total) by mouth 2 (two) times daily., Disp:  , Rfl:  .  levofloxacin (LEVAQUIN) 750 MG tablet, Take 1 tablet (750 mg total) by mouth daily. (Patient not taking: Reported on 07/26/2019), Disp: 2 tablet, Rfl: 0 .  metoprolol succinate (TOPROL-XL) 25 MG 24 hr tablet, Take 25 mg by mouth daily., Disp: , Rfl:  .  OXYGEN, Inhale 4 L into the lungs continuous. , Disp: , Rfl:  .  QUEtiapine (SEROQUEL) 100 MG tablet, Take 1 tablet (100 mg total) by mouth 2 (two) times daily., Disp:  , Rfl:  .  rosuvastatin (CRESTOR) 10 MG tablet, Take 1 tablet (10 mg total) by mouth daily., Disp: 30 tablet, Rfl: 1 .  tiZANidine (ZANAFLEX) 4 MG capsule, Take 4 mg by mouth 2 (two) times daily as needed for muscle spasms. , Disp: , Rfl:  .  traZODone (DESYREL) 100 MG tablet, Take 100 mg by mouth at bedtime., Disp: , Rfl:    Exam: Current vital signs: BP (!) 168/92   Pulse 73   Temp (!) 95.4 F (35.2 C)   Resp 18   Ht _0  (1.676 m)   Wt 108 kg   LMP 04/06/2017   SpO2 95%   BMI 38.43 kg/m  Vital signs in last 24 hours: Temp:  [95.4 F (35.2 C)-97.5 F (36.4 C)] 95.4 F (35.2 C) (02/13 1945) Pulse Rate:  [73-88] 73 (02/13 1945) Resp:  [18-19] 18 (02/13 1945) BP: (159-168)/(66-114) 168/92 (02/13 1945) SpO2:  [95 %-99 %] 95 % (02/13 1945) FiO2 (%):  [80 %-100 %] 80 % (02/13 2055) Weight:  [161 kg] 108 kg (02/13 1919) Gen: sedated intubated HEENT: Honor AT ETT in place and some crusted blood around the mouth. CVS: Irregularly irregular on monitor Resp:  vented NEUROLOGICAL Sedated intubated Some mild lower lip rhythmic twitching and intermittent sudden eye opening (without stimulation) Pupils round and sluggish Cough+ Gag+ Corneals + No spontaneous movement in any of the extremities No response to nox stim Breathing over the vent at times.   Labs I have reviewed labs in epic and the results pertinent to this consultation are:  CBC    Component Value Date/Time   WBC 23.8 (H) 08/11/2019 1902   RBC 6.34 (H) 08/01/2019 1902   HGB 19.7 (H) 08/21/2019 2019   HCT 58.0 (H) 08/06/2019 2019   PLT 193 08/24/2019 1902   MCV 100.0 08/24/2019 1902   MCH 26.8 08/02/2019 1902   MCHC 26.8 (L) 07/31/2019 1902   RDW 13.2 08/20/2019 1902   LYMPHSABS  17.7 (H) 08/04/2019 1902   MONOABS 0.7 08/23/2019 1902   EOSABS 0.3 08/27/2019 1902   BASOSABS 0.1 08/04/2019 1902    CMP     Component Value Date/Time   NA 141 08/07/2019 2019   K 3.9 08/13/2019 2019   CL 91 (L) 08/20/2019 1902   CO2 24 08/26/2019 1902   GLUCOSE 241 (H) 08/01/2019 1902   BUN 8 08/24/2019 1902   CREATININE 1.23 (H) 08/09/2019 1902   CALCIUM 9.3 08/04/2019 1902   PROT 7.4 08/19/2019 1902   ALBUMIN 3.5 08/17/2019 1902   AST 48 (H) 08/25/2019 1902   ALT 19 08/01/2019 1902   ALKPHOS 116 08/10/2019 1902   BILITOT 0.7 08/21/2019 1902   GFRNONAA 51 (L) 08/18/2019 1902   GFRAA 59 (L) 08/05/2019 1902   Imaging I have reviewed the images obtained: CTH with no acute changes  Assessment:  53/F with above PMH brought in s/p PEA cardiac arrest with a downtime of around 50 min from being down to ROSC. Comatose on arrival. Intubated. Started to exhibit myoclonic jerking. Called by [facility] attending on recs. I recommended Keppra load for symptomatic management. I was told she will be on TTM but further review of notes appears she is likely going to be compassionately extubated per family discussion.  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  Recommendations: There is a note that she might be compassionately extubated. If that is the case, then no further recs but if she remains full level of care, the following are recs: -Keppra and Clonazepam can be used for symptomatic myoclonic jerks -Load with Keppra 2g IV x1 -Continue home dose of Keppra -EEG  -Depakote IV is another option to consider for myoclonic jerks -Repeat head imaging in 24-48h to assess for cerebral edema.  -- Amie Portland, MD [facility] Pager: [phone] If 7pm to 7am, please call on call as listed on AMION.  CRITICAL CARE ATTESTATION Performed by: Amie Portland, MD Total critical care time: 30 minutes Critical care time was exclusive of separately billable procedures and treating other patients and/or supervising APPs/Residents/Students Critical care was necessary to treat or prevent imminent or life-threatening deterioration due to hypoxic anoxic brain injury, myoclonus, concern for seizrues This patient is critically ill and at significant risk for neurological worsening and/or death and care requires constant monitoring. Critical care was time spent personally by me on the following activities: development of treatment plan with patient and/or surrogate as well as nursing, discussions with consultants, evaluation of patient's response to treatment, examination of patient, obtaining history from patient or surrogate, ordering and performing treatments and interventions, ordering and review of laboratory studies, ordering and review of radiographic studies, pulse oximetry, re-evaluation of patient's condition,  participation in multidisciplinary rounds and medical decision making of high complexity in the care of this patient.   ADDENDUM Confirmed with CCM - patient is now CMO.  -- Amie Portland, MD [facility] Pager: [phone] If 7pm to 7am, please call on call as listed on AMION.

## 2019-08-12 NOTE — ED Notes (Signed)
Per EMS family reports pt slide out of chair onto floor unresponsive, EMS arrived @ 10 min later at 1802, CPR started, pt apneic, in PEA on monitor, CPR performed for 39 minutes before ROSC achieved, pt received 4 epi's, 2 shocks delivered for episodes of VTACH and VFIB.

## 2019-08-12 NOTE — ED Triage Notes (Signed)
Pt arrived from home by EMS after becoming unresponsive and sliding out of her chair at home, pt found to be pulseless and apneic by EMS.

## 2019-08-12 NOTE — ED Notes (Signed)
Ice packs placed on axillary and groin

## 2019-08-12 NOTE — Progress Notes (Signed)
eLink Physician-Brief Progress Note Patient Name: Diane Gilbert DOB: 12/10/1965 MRN: [id number]   Date of Service  08/07/2019  HPI/Events of Note  Agitation - Patient biting on ETT.   eICU Interventions  Will order: 1. Fentanyl 25-100 mcg IV Q 2 hours PRN agitation or sedation.      Intervention Category Major Interventions: Delirium, psychosis, severe agitation - evaluation and management  Ratasha Fabre Eugene 08/19/2019, 10:19 PM

## 2019-08-13 ENCOUNTER — Inpatient Hospital Stay (HOSPITAL_COMMUNITY): Payer: Medicaid Other

## 2019-08-13 ENCOUNTER — Encounter (HOSPITAL_COMMUNITY): Payer: Self-pay

## 2019-08-13 LAB — COMPREHENSIVE METABOLIC PANEL
ALT: 26 U/L (ref 0–44)
ALT: 26 U/L (ref 0–44)
ALT: 22 U/L (ref 0–44)
AST: 52 U/L — ABNORMAL HIGH (ref 15–41)
AST: 54 U/L — ABNORMAL HIGH (ref 15–41)
AST: 51 U/L — ABNORMAL HIGH (ref 15–41)
Albumin: 3.3 g/dL — ABNORMAL LOW (ref 3.5–5.0)
Albumin: 3.2 g/dL — ABNORMAL LOW (ref 3.5–5.0)
Albumin: 2.9 g/dL — ABNORMAL LOW (ref 3.5–5.0)
Alkaline Phosphatase: 83 U/L (ref 38–126)
Alkaline Phosphatase: 79 U/L (ref 38–126)
Alkaline Phosphatase: 70 U/L (ref 38–126)
Anion gap: 13 (ref 5–15)
Anion gap: 15 (ref 5–15)
Anion gap: 13 (ref 5–15)
BUN: 12 mg/dL (ref 6–20)
BUN: 12 mg/dL (ref 6–20)
BUN: 12 mg/dL (ref 6–20)
CO2: 29 mmol/L (ref 22–32)
CO2: 28 mmol/L (ref 22–32)
CO2: 29 mmol/L (ref 22–32)
Calcium: 8.6 mg/dL — ABNORMAL LOW (ref 8.9–10.3)
Calcium: 8.6 mg/dL — ABNORMAL LOW (ref 8.9–10.3)
Calcium: 8.2 mg/dL — ABNORMAL LOW (ref 8.9–10.3)
Chloride: 96 mmol/L — ABNORMAL LOW (ref 98–111)
Chloride: 97 mmol/L — ABNORMAL LOW (ref 98–111)
Chloride: 96 mmol/L — ABNORMAL LOW (ref 98–111)
Creatinine, Ser: 1 mg/dL (ref 0.44–1.00)
Creatinine, Ser: 1.05 mg/dL — ABNORMAL HIGH (ref 0.44–1.00)
Creatinine, Ser: 0.82 mg/dL (ref 0.44–1.00)
GFR calc Af Amer: >60 mL/min (ref >60)
GFR calc Af Amer: >60 mL/min (ref >60)
GFR calc Af Amer: >60 mL/min (ref >60)
GFR calc non Af Amer: >60 mL/min (ref >60)
GFR calc non Af Amer: >60 mL/min (ref >60)
GFR calc non Af Amer: >60 mL/min (ref >60)
Glucose, Bld: 197 mg/dL — ABNORMAL HIGH (ref 70–99)
Glucose, Bld: 160 mg/dL — ABNORMAL HIGH (ref 70–99)
Glucose, Bld: 122 mg/dL — ABNORMAL HIGH (ref 70–99)
Potassium: 4 mmol/L (ref 3.5–5.1)
Potassium: 4 mmol/L (ref 3.5–5.1)
Potassium: 3.7 mmol/L (ref 3.5–5.1)
Sodium: 138 mmol/L (ref 135–145)
Sodium: 140 mmol/L (ref 135–145)
Sodium: 138 mmol/L (ref 135–145)
Total Bilirubin: 0.8 mg/dL (ref 0.3–1.2)
Total Bilirubin: 0.8 mg/dL (ref 0.3–1.2)
Total Bilirubin: 0.8 mg/dL (ref 0.3–1.2)
Total Protein: 6.9 g/dL (ref 6.5–8.1)
Total Protein: 6.7 g/dL (ref 6.5–8.1)
Total Protein: 6.6 g/dL (ref 6.5–8.1)

## 2019-08-13 LAB — POCT I-STAT 7, (LYTES, BLD GAS, ICA,H+H)
Acid-Base Excess: 9 mmol/L — ABNORMAL HIGH (ref 0.0–2.0)
Acid-Base Excess: 7 mmol/L — ABNORMAL HIGH (ref 0.0–2.0)
Bicarbonate: 35.2 mmol/L — ABNORMAL HIGH (ref 20.0–28.0)
Bicarbonate: 32.1 mmol/L — ABNORMAL HIGH (ref 20.0–28.0)
Calcium, Ion: 1.09 mmol/L — ABNORMAL LOW (ref 1.15–1.40)
Calcium, Ion: 1.09 mmol/L — ABNORMAL LOW (ref 1.15–1.40)
HCT: 56 % — ABNORMAL HIGH (ref 36.0–46.0)
HCT: 50 % — ABNORMAL HIGH (ref 36.0–46.0)
Hemoglobin: 19 g/dL — ABNORMAL HIGH (ref 12.0–15.0)
Hemoglobin: 17 g/dL — ABNORMAL HIGH (ref 12.0–15.0)
O2 Saturation: 99 %
O2 Saturation: 89 %
Patient temperature: 97.9
Patient temperature: 99.8
Potassium: 3.6 mmol/L (ref 3.5–5.1)
Potassium: 3.6 mmol/L (ref 3.5–5.1)
Sodium: 137 mmol/L (ref 135–145)
Sodium: 138 mmol/L (ref 135–145)
TCO2: 37 mmol/L — ABNORMAL HIGH (ref 22–32)
TCO2: 34 mmol/L — ABNORMAL HIGH (ref 22–32)
pCO2 arterial: 51.2 mmHg — ABNORMAL HIGH (ref 32.0–48.0)
pCO2 arterial: 48.2 mmHg — ABNORMAL HIGH (ref 32.0–48.0)
pH, Arterial: 7.444 (ref 7.350–7.450)
pH, Arterial: 7.434 (ref 7.350–7.450)
pO2, Arterial: 137 mmHg — ABNORMAL HIGH (ref 83.0–108.0)
pO2, Arterial: 58 mmHg — ABNORMAL LOW (ref 83.0–108.0)

## 2019-08-13 LAB — CBC WITH DIFFERENTIAL/PLATELET
Abs Immature Granulocytes: 0.11 10*3/uL — ABNORMAL HIGH (ref 0.00–0.07)
Abs Immature Granulocytes: 0.13 10*3/uL — ABNORMAL HIGH (ref 0.00–0.07)
Basophils Absolute: 0 10*3/uL (ref 0.0–0.1)
Basophils Absolute: 0.1 10*3/uL (ref 0.0–0.1)
Basophils Relative: 0 %
Basophils Relative: 0 %
Eosinophils Absolute: 0 10*3/uL (ref 0.0–0.5)
Eosinophils Absolute: 0.1 10*3/uL (ref 0.0–0.5)
Eosinophils Relative: 0 %
Eosinophils Relative: 0 %
HCT: 56.9 % — ABNORMAL HIGH (ref 36.0–46.0)
HCT: 51 % — ABNORMAL HIGH (ref 36.0–46.0)
Hemoglobin: 17 g/dL — ABNORMAL HIGH (ref 12.0–15.0)
Hemoglobin: 15.5 g/dL — ABNORMAL HIGH (ref 12.0–15.0)
Immature Granulocytes: 1 %
Immature Granulocytes: 1 %
Lymphocytes Relative: 9 %
Lymphocytes Relative: 13 %
Lymphs Abs: 1.8 10*3/uL (ref 0.7–4.0)
Lymphs Abs: 3.2 10*3/uL (ref 0.7–4.0)
MCH: 27.2 pg (ref 26.0–34.0)
MCH: 26.7 pg (ref 26.0–34.0)
MCHC: 29.9 g/dL — ABNORMAL LOW (ref 30.0–36.0)
MCHC: 30.4 g/dL (ref 30.0–36.0)
MCV: 91 fL (ref 80.0–100.0)
MCV: 87.9 fL (ref 80.0–100.0)
Monocytes Absolute: 0.7 10*3/uL (ref 0.1–1.0)
Monocytes Absolute: 1.5 10*3/uL — ABNORMAL HIGH (ref 0.1–1.0)
Monocytes Relative: 4 %
Monocytes Relative: 6 %
Neutro Abs: 17 10*3/uL — ABNORMAL HIGH (ref 1.7–7.7)
Neutro Abs: 19.5 10*3/uL — ABNORMAL HIGH (ref 1.7–7.7)
Neutrophils Relative %: 86 %
Neutrophils Relative %: 80 %
Platelets: 199 10*3/uL (ref 150–400)
Platelets: 189 10*3/uL (ref 150–400)
RBC: 6.25 MIL/uL — ABNORMAL HIGH (ref 3.87–5.11)
RBC: 5.8 MIL/uL — ABNORMAL HIGH (ref 3.87–5.11)
RDW: 13.2 % (ref 11.5–15.5)
RDW: 13.6 % (ref 11.5–15.5)
WBC: 19.6 10*3/uL — ABNORMAL HIGH (ref 4.0–10.5)
WBC: 24.4 10*3/uL — ABNORMAL HIGH (ref 4.0–10.5)
nRBC: 0 % (ref 0.0–0.2)
nRBC: 0 % (ref 0.0–0.2)

## 2019-08-13 LAB — URINALYSIS, ROUTINE W REFLEX MICROSCOPIC
Bacteria, UA: NONE SEEN
Bacteria, UA: NONE SEEN
Bilirubin Urine: NEGATIVE
Bilirubin Urine: NEGATIVE
Glucose, UA: NEGATIVE mg/dL
Glucose, UA: NEGATIVE mg/dL
Hgb urine dipstick: NEGATIVE
Hgb urine dipstick: NEGATIVE
Ketones, ur: NEGATIVE mg/dL
Ketones, ur: NEGATIVE mg/dL
Nitrite: NEGATIVE
Nitrite: NEGATIVE
Protein, ur: 30 mg/dL — AB
Protein, ur: 30 mg/dL — AB
Specific Gravity, Urine: 1.025 (ref 1.005–1.030)
Specific Gravity, Urine: 1.028 (ref 1.005–1.030)
pH: 5 (ref 5.0–8.0)
pH: 5 (ref 5.0–8.0)

## 2019-08-13 LAB — LACTIC ACID, PLASMA
Lactic Acid, Venous: 3.9 mmol/L (ref 0.5–1.9)
Lactic Acid, Venous: 3.2 mmol/L (ref 0.5–1.9)
Lactic Acid, Venous: 3.2 mmol/L (ref 0.5–1.9)

## 2019-08-13 LAB — EXPECTORATED SPUTUM ASSESSMENT W GRAM STAIN, RFLX TO RESP C

## 2019-08-13 LAB — GLUCOSE, CAPILLARY
Glucose-Capillary: 187 mg/dL — ABNORMAL HIGH (ref 70–99)
Glucose-Capillary: 150 mg/dL — ABNORMAL HIGH (ref 70–99)
Glucose-Capillary: 164 mg/dL — ABNORMAL HIGH (ref 70–99)
Glucose-Capillary: 167 mg/dL — ABNORMAL HIGH (ref 70–99)
Glucose-Capillary: 138 mg/dL — ABNORMAL HIGH (ref 70–99)
Glucose-Capillary: 111 mg/dL — ABNORMAL HIGH (ref 70–99)
Glucose-Capillary: 147 mg/dL — ABNORMAL HIGH (ref 70–99)

## 2019-08-13 LAB — LACTATE DEHYDROGENASE: LDH: 326 U/L — ABNORMAL HIGH (ref 98–192)

## 2019-08-13 LAB — POCT ACTIVATED CLOTTING TIME: Activated Clotting Time: 120 seconds

## 2019-08-13 LAB — BILIRUBIN, DIRECT: Bilirubin, Direct: 0.3 mg/dL — ABNORMAL HIGH (ref 0.0–0.2)

## 2019-08-13 LAB — MAGNESIUM
Magnesium: 1.5 mg/dL — ABNORMAL LOW (ref 1.7–2.4)
Magnesium: 2.1 mg/dL (ref 1.7–2.4)

## 2019-08-13 LAB — APTT
aPTT: 25 seconds (ref 24–36)
aPTT: 26 seconds (ref 24–36)

## 2019-08-13 LAB — PROTIME-INR
INR: 1 (ref 0.8–1.2)
INR: 1.1 (ref 0.8–1.2)
Prothrombin Time: 13.5 seconds (ref 11.4–15.2)
Prothrombin Time: 13.9 seconds (ref 11.4–15.2)

## 2019-08-13 LAB — GAMMA GT: GGT: 89 U/L — ABNORMAL HIGH (ref 7–50)

## 2019-08-13 LAB — PHOSPHORUS
Phosphorus: 1.5 mg/dL — ABNORMAL LOW (ref 2.5–4.6)
Phosphorus: 3 mg/dL (ref 2.5–4.6)

## 2019-08-13 LAB — PREGNANCY, URINE: Preg Test, Ur: NEGATIVE

## 2019-08-13 MED ORDER — SODIUM CHLORIDE 0.9 % IV SOLN
1.0000 g | Freq: Three times a day (TID) | INTRAVENOUS | Status: DC
  Administered 2019-08-13 – 2019-08-14 (×4): 1 g via INTRAVENOUS
  Filled 2019-08-13 (×6): qty 1

## 2019-08-13 MED ORDER — CHLORHEXIDINE GLUCONATE 0.12% ORAL RINSE (MEDLINE KIT)
15.0000 mL | Freq: Two times a day (BID) | OROMUCOSAL | Status: DC
  Administered 2019-08-13 – 2019-08-14 (×3): 15 mL via OROMUCOSAL

## 2019-08-13 MED ORDER — SCOPOLAMINE 1 MG/3DAYS TD PT72
1.0000 | MEDICATED_PATCH | TRANSDERMAL | Status: DC
  Administered 2019-08-13: 1.5 mg via TRANSDERMAL
  Filled 2019-08-13: qty 1

## 2019-08-13 MED ORDER — INSULIN ASPART 100 UNIT/ML ~~LOC~~ SOLN
0.0000 [IU] | SUBCUTANEOUS | Status: DC
  Administered 2019-08-13 (×2): 3 [IU] via SUBCUTANEOUS
  Administered 2019-08-13: 4 [IU] via SUBCUTANEOUS
  Administered 2019-08-14: 3 [IU] via SUBCUTANEOUS
  Administered 2019-08-14: 4 [IU] via SUBCUTANEOUS

## 2019-08-13 MED ORDER — METRONIDAZOLE IN NACL 5-0.79 MG/ML-% IV SOLN
500.0000 mg | Freq: Three times a day (TID) | INTRAVENOUS | Status: DC
  Administered 2019-08-13 – 2019-08-14 (×5): 500 mg via INTRAVENOUS
  Filled 2019-08-13 (×5): qty 100

## 2019-08-13 MED ORDER — LABETALOL HCL 5 MG/ML IV SOLN
10.0000 mg | INTRAVENOUS | Status: DC | PRN
  Administered 2019-08-13 – 2019-08-14 (×3): 10 mg via INTRAVENOUS
  Filled 2019-08-13 (×4): qty 4

## 2019-08-13 MED ORDER — VANCOMYCIN HCL 1500 MG/300ML IV SOLN
1500.0000 mg | Freq: Once | INTRAVENOUS | Status: AC
  Administered 2019-08-13: 1500 mg via INTRAVENOUS
  Filled 2019-08-13: qty 300

## 2019-08-13 MED ORDER — SODIUM CHLORIDE 0.9 % IV SOLN: 250.0000 mL | INTRAVENOUS | Status: DC

## 2019-08-13 MED ORDER — CHLORHEXIDINE GLUCONATE CLOTH 2 % EX PADS
6.0000 | MEDICATED_PAD | Freq: Every day | CUTANEOUS | Status: DC
  Administered 2019-08-14 (×2): 6 via TOPICAL

## 2019-08-13 MED ORDER — LACTATED RINGERS IV SOLN: INTRAVENOUS | Status: DC

## 2019-08-13 MED ORDER — PHENYLEPHRINE HCL-NACL 10-0.9 MG/250ML-% IV SOLN: 25.0000 ug/min | INTRAVENOUS | Status: DC

## 2019-08-13 MED ORDER — FUROSEMIDE 10 MG/ML IJ SOLN
40.0000 mg | Freq: Once | INTRAMUSCULAR | Status: AC
  Administered 2019-08-13: 40 mg via INTRAVENOUS
  Filled 2019-08-13: qty 4

## 2019-08-13 MED ORDER — ORAL CARE MOUTH RINSE
15.0000 mL | OROMUCOSAL | Status: DC
  Administered 2019-08-13 – 2019-08-14 (×14): 15 mL via OROMUCOSAL

## 2019-08-13 MED ORDER — POTASSIUM PHOSPHATES 15 MMOLE/5ML IV SOLN
30.0000 mmol | Freq: Once | INTRAVENOUS | Status: AC
  Administered 2019-08-13: 30 mmol via INTRAVENOUS
  Filled 2019-08-13: qty 10

## 2019-08-13 MED ORDER — MAGNESIUM SULFATE 50 % IJ SOLN
3.0000 g | Freq: Once | INTRAVENOUS | Status: AC
  Administered 2019-08-13: 3 g via INTRAVENOUS
  Filled 2019-08-13: qty 6

## 2019-08-13 MED ORDER — LEVETIRACETAM IN NACL 500 MG/100ML IV SOLN
500.0000 mg | Freq: Two times a day (BID) | INTRAVENOUS | Status: DC
  Administered 2019-08-13 – 2019-08-14 (×3): 500 mg via INTRAVENOUS
  Filled 2019-08-13 (×3): qty 100

## 2019-08-13 NOTE — Progress Notes (Signed)
eLink Physician-Brief Progress Note Patient Name: HANG AMMON DOB: 1966/05/25 MRN: [id number]   Date of Service  08/13/2019  HPI/Events of Note  Volume overload. Request from CDS for Lasix 2m IV.   eICU Interventions  Order entered.     Intervention Category Intermediate Interventions: Other:  RCharlott Rakes2/14/2021, 11:24 PM

## 2019-08-13 NOTE — Progress Notes (Signed)
Elink notified CDS prior to withdrawing care per protocol. CDS currently following pt, and will reach out to family.

## 2019-08-13 NOTE — Progress Notes (Signed)
Chart reviewed. Noted that family is moving towards palliative extubation, if patient not a candidate for organ donation. She has had coronary angiogram in 12/2017 showing normal coronaries. I will be available for any cardiology input, if necessary, as per organ donation protocol.   Nigel Mormon, MD [facility] Pager: [phone] Office: [phone] Cell: [phone]

## 2019-08-13 NOTE — Progress Notes (Signed)
[place] Progress Note Patient Name: Diane Gilbert DOB: 10-06-1965 MRN: [id number]   Date of Service  08/13/2019  HPI/Events of Note  Hypertension - BP = 180/92 with MAP = 122 by A-line.   eICU Interventions  Will order: 1. Labetalol 10 mg IV Q 2 hours PRN SBP > 170.     Intervention Category Major Interventions: Hypertension - evaluation and management  Betsy Rosello Eugene 08/13/2019, 5:17 AM

## 2019-08-13 NOTE — Progress Notes (Signed)
[facility] PCCM  I know Ms. Templeton well as I took over from Dr. Gwenette Greet several years ago to manage her RB-ILD, diastolic heart failure and ongoing tobacco abuse.   Admitted overnight after cardiac arrest Per chart it seems it took 45-50 minutes to regain spontaneous circulation Had myoclonic jerking after admission, neuro consulted Family has decided to palliatively extubate She is currently being evaluated for organ donation   Vent Mode: PRVC FiO2 (%):  [70 %-100 %] 70 % Set Rate:  [16 bmp-26 bmp] 26 bmp Vt Set:  [470 mL] 470 mL PEEP:  [5 cmH20] 5 cmH20 Plateau Pressure:  [24 cmH20-32 cmH20] 26 cmH20 Vitals:   08/13/19 0745 08/13/19 0846  BP:    Pulse:    Resp:    Temp:  99.1 F (37.3 C)  SpO2: 92%     Intake/Output Summary (Last 24 hours) at 08/13/2019 0905 Last data filed at 08/13/2019 0600 Gross per 24 hour  Intake 491.62 ml  Output 303 ml  Net 188.62 ml   General:  In bed on vent HENT: NCAT ETT in place PULM: CTA B, vent supported breathing CV: RRR, no mgr GI: BS+, soft, nontender MSK: normal bulk and tone Neuro: sporadic eye opening, no motor movement noted, no response to external stimuli  CBC    Component Value Date/Time   WBC 19.6 (H) 08/13/2019 0329   RBC 6.25 (H) 08/13/2019 0329   HGB 19.0 (H) 08/13/2019 0345   HCT 56.0 (H) 08/13/2019 0345   PLT 199 08/13/2019 0329   MCV 91.0 08/13/2019 0329   MCH 27.2 08/13/2019 0329   MCHC 29.9 (L) 08/13/2019 0329   RDW 13.2 08/13/2019 0329   LYMPHSABS 1.8 08/13/2019 0329   MONOABS 0.7 08/13/2019 0329   EOSABS 0.0 08/13/2019 0329   BASOSABS 0.0 08/13/2019 0329   BMET    Component Value Date/Time   NA 137 08/13/2019 0345   K 3.6 08/13/2019 0345   CL 96 (L) 08/13/2019 0329   CO2 29 08/13/2019 0329   GLUCOSE 197 (H) 08/13/2019 0329   BUN 12 08/13/2019 0329   CREATININE 1.00 08/13/2019 0329   CALCIUM 8.6 (L) 08/13/2019 0329   GFRNONAA >60 08/13/2019 0329   GFRAA >60 08/13/2019 0329   Impression: Cardiac  arrest Acute on chronic respiratory failure with hypoxemia Chronic diastolic heart failure hypomagnesemia Hypophosphatemia Anoxic brain injury Hyperglycemia  Plan: Continue full mechanical vent support If not a candidate for organ donation, will plan palliative extubation Replace Mg, Phos Start SSI VAP prevention   Cc time 30 minutes  Roselie Awkward, MD [place] [facility]M Pager: [phone] Cell: [phone] If no response, call [phone]

## 2019-08-13 NOTE — Progress Notes (Signed)
Pt transported from [place] to [place] and back with no complications.

## 2019-08-13 NOTE — Progress Notes (Signed)
[place] Progress Note Patient Name: Diane Gilbert DOB: 30-Jan-1966 MRN: [id number]   Date of Service  08/13/2019  HPI/Events of Note  Nursing notes seizure like activity around mouth and eyes. Loaded with Keppra in ED. Seizure vs anoxic myoclonus.   eICU Interventions  Will order: 1. Keppra 500 mg now and Q 12 hours.  2. Use Versed 2-4 mg IV Q 4 hours PRN as needed.      Intervention Category Major Interventions: Seizures - evaluation and management  Eulogia Dismore Cornelia Copa 08/13/2019, 5:51 AM

## 2019-08-14 ENCOUNTER — Encounter (HOSPITAL_COMMUNITY): Admission: EM | Disposition: E | Payer: Self-pay | Source: Home / Self Care | Attending: Pulmonary Disease

## 2019-08-14 HISTORY — PX: ORGAN PROCUREMENT: SHX5270

## 2019-08-14 LAB — POCT I-STAT 7, (LYTES, BLD GAS, ICA,H+H)
Acid-Base Excess: 7 mmol/L — ABNORMAL HIGH (ref 0.0–2.0)
Bicarbonate: 33 mmol/L — ABNORMAL HIGH (ref 20.0–28.0)
Calcium, Ion: 1.07 mmol/L — ABNORMAL LOW (ref 1.15–1.40)
HCT: 50 % — ABNORMAL HIGH (ref 36.0–46.0)
Hemoglobin: 17 g/dL — ABNORMAL HIGH (ref 12.0–15.0)
O2 Saturation: 95 %
Potassium: 3.6 mmol/L (ref 3.5–5.1)
Sodium: 139 mmol/L (ref 135–145)
TCO2: 34 mmol/L — ABNORMAL HIGH (ref 22–32)
pCO2 arterial: 49.8 mmHg — ABNORMAL HIGH (ref 32.0–48.0)
pH, Arterial: 7.429 (ref 7.350–7.450)
pO2, Arterial: 74 mmHg — ABNORMAL LOW (ref 83.0–108.0)

## 2019-08-14 LAB — COMPREHENSIVE METABOLIC PANEL
ALT: 21 U/L (ref 0–44)
AST: 63 U/L — ABNORMAL HIGH (ref 15–41)
Albumin: 2.8 g/dL — ABNORMAL LOW (ref 3.5–5.0)
Alkaline Phosphatase: 60 U/L (ref 38–126)
Anion gap: 11 (ref 5–15)
BUN: 12 mg/dL (ref 6–20)
CO2: 29 mmol/L (ref 22–32)
Calcium: 8.1 mg/dL — ABNORMAL LOW (ref 8.9–10.3)
Chloride: 99 mmol/L (ref 98–111)
Creatinine, Ser: 1.22 mg/dL — ABNORMAL HIGH (ref 0.44–1.00)
GFR calc Af Amer: 59 mL/min — ABNORMAL LOW (ref >60)
GFR calc non Af Amer: 51 mL/min — ABNORMAL LOW (ref >60)
Glucose, Bld: 139 mg/dL — ABNORMAL HIGH (ref 70–99)
Potassium: 3.7 mmol/L (ref 3.5–5.1)
Sodium: 139 mmol/L (ref 135–145)
Total Bilirubin: 0.8 mg/dL (ref 0.3–1.2)
Total Protein: 6.5 g/dL (ref 6.5–8.1)

## 2019-08-14 LAB — CBC
HCT: 49.8 % — ABNORMAL HIGH (ref 36.0–46.0)
Hemoglobin: 15.1 g/dL — ABNORMAL HIGH (ref 12.0–15.0)
MCH: 26.9 pg (ref 26.0–34.0)
MCHC: 30.3 g/dL (ref 30.0–36.0)
MCV: 88.8 fL (ref 80.0–100.0)
Platelets: 175 10*3/uL (ref 150–400)
RBC: 5.61 MIL/uL — ABNORMAL HIGH (ref 3.87–5.11)
RDW: 13.7 % (ref 11.5–15.5)
WBC: 21.2 10*3/uL — ABNORMAL HIGH (ref 4.0–10.5)
nRBC: 0 % (ref 0.0–0.2)

## 2019-08-14 LAB — URINE CULTURE: Culture: NO GROWTH

## 2019-08-14 LAB — GLUCOSE, CAPILLARY
Glucose-Capillary: 153 mg/dL — ABNORMAL HIGH (ref 70–99)
Glucose-Capillary: 98 mg/dL (ref 70–99)
Glucose-Capillary: 115 mg/dL — ABNORMAL HIGH (ref 70–99)

## 2019-08-14 LAB — MAGNESIUM: Magnesium: 1.8 mg/dL (ref 1.7–2.4)

## 2019-08-14 LAB — PROTIME-INR
INR: 1.1 (ref 0.8–1.2)
Prothrombin Time: 13.9 seconds (ref 11.4–15.2)

## 2019-08-14 LAB — APTT: aPTT: 26 seconds (ref 24–36)

## 2019-08-14 LAB — PHOSPHORUS: Phosphorus: 3 mg/dL (ref 2.5–4.6)

## 2019-08-14 SURGERY — SURGICAL PROCUREMENT, ORGAN
Anesthesia: Choice

## 2019-08-14 MED ORDER — FENTANYL BOLUS VIA INFUSION
100.0000 ug | INTRAVENOUS | Status: DC | PRN
  Administered 2019-08-14 (×2): 100 ug via INTRAVENOUS
  Filled 2019-08-14: qty 100

## 2019-08-14 MED ORDER — MIDAZOLAM 50MG/50ML (1MG/ML) PREMIX INFUSION
0.0000 mg/h | INTRAVENOUS | Status: DC
  Administered 2019-08-14: 7 mg/h via INTRAVENOUS
  Administered 2019-08-14: 2 mg/h via INTRAVENOUS
  Filled 2019-08-14 (×2): qty 50

## 2019-08-14 MED ORDER — ACETAMINOPHEN 325 MG PO TABS: 650.0000 mg | ORAL_TABLET | ORAL | Status: DC | PRN

## 2019-08-14 MED ORDER — GLYCOPYRROLATE 0.2 MG/ML IJ SOLN: 0.2000 mg | INTRAMUSCULAR | Status: DC | PRN

## 2019-08-14 MED ORDER — FENTANYL CITRATE (PF) 100 MCG/2ML IJ SOLN
50.0000 ug | INTRAMUSCULAR | Status: DC | PRN
  Administered 2019-08-14: 50 ug via INTRAVENOUS

## 2019-08-14 MED ORDER — GLYCOPYRROLATE 0.2 MG/ML IJ SOLN
0.2000 mg | INTRAMUSCULAR | Status: DC | PRN
  Administered 2019-08-14: 0.2 mg via INTRAVENOUS
  Filled 2019-08-14: qty 1

## 2019-08-14 MED ORDER — HEPARIN SODIUM (PORCINE) 1000 UNIT/ML IJ SOLN
11000.0000 [IU] | Freq: Once | INTRAMUSCULAR | Status: AC
  Administered 2019-08-14: 11000 [IU] via INTRAVENOUS
  Filled 2019-08-14: qty 11

## 2019-08-14 MED ORDER — HEPARIN SODIUM (PORCINE) 20000 UNIT/ML IJ SOLN
100.0000 [IU]/kg | Freq: Once | INTRAMUSCULAR | Status: DC
  Filled 2019-08-14: qty 0.55

## 2019-08-14 MED ORDER — MIDAZOLAM BOLUS VIA INFUSION (WITHDRAWAL LIFE SUSTAINING TX)
2.0000 mg | INTRAVENOUS | Status: DC | PRN
  Administered 2019-08-14 (×4): 2 mg via INTRAVENOUS
  Filled 2019-08-14: qty 2

## 2019-08-14 MED ORDER — FENTANYL 2500MCG IN NS 250ML (10MCG/ML) PREMIX INFUSION
0.0000 ug/h | INTRAVENOUS | Status: DC
  Administered 2019-08-14: 400 ug/h via INTRAVENOUS
  Administered 2019-08-14: 200 ug/h via INTRAVENOUS
  Filled 2019-08-14: qty 250

## 2019-08-14 MED ORDER — POLYVINYL ALCOHOL 1.4 % OP SOLN: 1.0000 [drp] | Freq: Four times a day (QID) | OPHTHALMIC | Status: DC | PRN

## 2019-08-14 MED ORDER — DIPHENHYDRAMINE HCL 50 MG/ML IJ SOLN: 25.0000 mg | INTRAMUSCULAR | Status: DC | PRN

## 2019-08-14 MED ORDER — ACETAMINOPHEN 650 MG RE SUPP: 650.0000 mg | Freq: Four times a day (QID) | RECTAL | Status: DC | PRN

## 2019-08-14 MED ORDER — GLYCOPYRROLATE 1 MG PO TABS
1.0000 mg | ORAL_TABLET | ORAL | Status: DC | PRN
  Filled 2019-08-14: qty 1

## 2019-08-14 MED ORDER — 0.9 % SODIUM CHLORIDE (POUR BTL) OPTIME
TOPICAL | Status: DC | PRN
  Administered 2019-08-14: 4000 mL

## 2019-08-14 MED ORDER — HEPARIN SODIUM (PORCINE) 1000 UNIT/ML IJ SOLN: 1100.0000 [IU] | Freq: Once | INTRAMUSCULAR | Status: DC

## 2019-08-14 MED ORDER — ACETAMINOPHEN 325 MG PO TABS: 650.0000 mg | ORAL_TABLET | Freq: Four times a day (QID) | ORAL | Status: DC | PRN

## 2019-08-14 MED ORDER — MIDAZOLAM HCL 2 MG/2ML IJ SOLN: 1.0000 mg | INTRAMUSCULAR | Status: DC | PRN

## 2019-08-14 MED ORDER — DEXTROSE 5 % IV SOLN: INTRAVENOUS | Status: DC

## 2019-08-14 SURGICAL SUPPLY — 89 items
BLADE CLIPPER SURG (BLADE) ×3 IMPLANT
BLADE SAW STERNAL (BLADE) ×3 IMPLANT
BLADE STERNUM SYSTEM 6 (BLADE) ×3 IMPLANT
BLADE SURG 10 STRL SS (BLADE) IMPLANT
CLIP VESOCCLUDE MED 24/CT (CLIP) IMPLANT
CLIP VESOCCLUDE SM WIDE 24/CT (CLIP) IMPLANT
CNTNR URN SCR LID CUP LEK RST (MISCELLANEOUS) ×1 IMPLANT
CONT SPEC 4OZ CLIKSEAL STRL BL (MISCELLANEOUS) ×6 IMPLANT
CONT SPEC 4OZ STRL OR WHT (MISCELLANEOUS) ×3
COVER BACK TABLE 60X90IN (DRAPES) IMPLANT
COVER MAYO STAND STRL (DRAPES) ×3 IMPLANT
COVER SURGICAL LIGHT HANDLE (MISCELLANEOUS) ×3 IMPLANT
COVER WAND RF STERILE (DRAPES) IMPLANT
DRAPE HALF SHEET 40X57 (DRAPES) IMPLANT
DRAPE IMP U-DRAPE 54X76 (DRAPES) ×3 IMPLANT
DRAPE SLUSH MACHINE 52X66 (DRAPES) ×3 IMPLANT
DRSG COVADERM 4X10 (GAUZE/BANDAGES/DRESSINGS) ×6 IMPLANT
DRSG COVADERM 4X14 (GAUZE/BANDAGES/DRESSINGS) ×6 IMPLANT
DRSG TELFA 3X8 NADH (GAUZE/BANDAGES/DRESSINGS) ×3 IMPLANT
DURAPREP 26ML APPLICATOR (WOUND CARE) IMPLANT
ELECT BLADE 6.5 EXT (BLADE) ×3 IMPLANT
ELECT REM PT RETURN 9FT ADLT (ELECTROSURGICAL) ×6
ELECTRODE REM PT RTRN 9FT ADLT (ELECTROSURGICAL) ×2 IMPLANT
GAUZE 4X4 16PLY RFD (DISPOSABLE) IMPLANT
GLOVE BIO SURGEON STRL SZ7 (GLOVE) IMPLANT
GLOVE BIO SURGEON STRL SZ7.5 (GLOVE) IMPLANT
GLOVE BIO SURGEON STRL SZ8 (GLOVE) IMPLANT
GLOVE BIO SURGEON STRL SZ8.5 (GLOVE) IMPLANT
GLOVE BIOGEL PI IND STRL 7.0 (GLOVE) IMPLANT
GLOVE BIOGEL PI IND STRL 7.5 (GLOVE) IMPLANT
GLOVE BIOGEL PI IND STRL 8 (GLOVE) IMPLANT
GLOVE BIOGEL PI IND STRL 8.5 (GLOVE) IMPLANT
GLOVE BIOGEL PI INDICATOR 7.0 (GLOVE)
GLOVE BIOGEL PI INDICATOR 7.5 (GLOVE)
GLOVE BIOGEL PI INDICATOR 8 (GLOVE)
GLOVE BIOGEL PI INDICATOR 8.5 (GLOVE)
GLOVE SURG SS PI 7.0 STRL IVOR (GLOVE) IMPLANT
GLOVE SURG SS PI 7.5 STRL IVOR (GLOVE) IMPLANT
GLOVE SURG SS PI 8.0 STRL IVOR (GLOVE) IMPLANT
GOWN STRL REUS W/ TWL LRG LVL3 (GOWN DISPOSABLE) ×4 IMPLANT
GOWN STRL REUS W/ TWL XL LVL3 (GOWN DISPOSABLE) ×2 IMPLANT
GOWN STRL REUS W/TWL LRG LVL3 (GOWN DISPOSABLE) ×12
GOWN STRL REUS W/TWL XL LVL3 (GOWN DISPOSABLE) ×6
HANDLE SUCTION POOLE (INSTRUMENTS) IMPLANT
KIT POST MORTEM ADULT 36X90 (BAG) ×3 IMPLANT
KIT TURNOVER KIT B (KITS) ×3 IMPLANT
LOOP VESSEL MAXI BLUE (MISCELLANEOUS) IMPLANT
LOOP VESSEL MINI RED (MISCELLANEOUS) IMPLANT
MANIFOLD NEPTUNE II (INSTRUMENTS) ×3 IMPLANT
NEEDLE BIOPSY 14X6 SOFT TISS (NEEDLE) IMPLANT
NS IRRIG 1000ML POUR BTL (IV SOLUTION) IMPLANT
PACK AORTA (CUSTOM PROCEDURE TRAY) ×3 IMPLANT
PAD ARMBOARD 7.5X6 YLW CONV (MISCELLANEOUS) ×6 IMPLANT
PENCIL BUTTON HOLSTER BLD 10FT (ELECTRODE) ×3 IMPLANT
SOL PREP POV-IOD 4OZ 10% (MISCELLANEOUS) ×6 IMPLANT
SPONGE INTESTINAL PEANUT (DISPOSABLE) IMPLANT
SPONGE LAP 18X18 RF (DISPOSABLE) IMPLANT
STAPLER VISISTAT 35W (STAPLE) ×3 IMPLANT
SUCTION POOLE HANDLE (INSTRUMENTS)
SUT BONE WAX W31G (SUTURE) IMPLANT
SUT ETHIBOND 5 LR DA (SUTURE) ×12 IMPLANT
SUT ETHILON 1 LR 30 (SUTURE) ×6 IMPLANT
SUT ETHILON 2 LR (SUTURE) ×12 IMPLANT
SUT PDS AB 4-0 SH 27 (SUTURE) ×6 IMPLANT
SUT PROLENE 3 0 RB 1 (SUTURE) IMPLANT
SUT PROLENE 3 0 SH 1 (SUTURE) IMPLANT
SUT PROLENE 4 0 RB 1 (SUTURE)
SUT PROLENE 4-0 RB1 .5 CRCL 36 (SUTURE) IMPLANT
SUT PROLENE 5 0 C 1 24 (SUTURE) IMPLANT
SUT PROLENE 6 0 BV (SUTURE) IMPLANT
SUT SILK 0 TIES 10X30 (SUTURE) IMPLANT
SUT SILK 1 SH (SUTURE) IMPLANT
SUT SILK 1 TIES 10X30 (SUTURE) IMPLANT
SUT SILK 2 0 (SUTURE)
SUT SILK 2 0 SH (SUTURE) IMPLANT
SUT SILK 2 0 SH CR/8 (SUTURE) IMPLANT
SUT SILK 2 0 TIES 10X30 (SUTURE) IMPLANT
SUT SILK 2-0 18XBRD TIE 12 (SUTURE) IMPLANT
SUT SILK 3 0 SH CR/8 (SUTURE) ×3 IMPLANT
SUT SILK 3 0 TIES 10X30 (SUTURE) IMPLANT
SWAB COLLECTION DEVICE MRSA (MISCELLANEOUS) IMPLANT
SWAB CULTURE ESWAB REG 1ML (MISCELLANEOUS) IMPLANT
SYR 50ML LL SCALE MARK (SYRINGE) IMPLANT
SYRINGE TOOMEY DISP (SYRINGE) IMPLANT
TAPE UMBILICAL 1/8 X36 TWILL (MISCELLANEOUS) IMPLANT
TUBE CONNECTING 12'X1/4 (SUCTIONS) ×2
TUBE CONNECTING 12X1/4 (SUCTIONS) ×4 IMPLANT
WATER STERILE IRR 1000ML POUR (IV SOLUTION) IMPLANT
YANKAUER SUCT BULB TIP NO VENT (SUCTIONS) ×3 IMPLANT

## 2019-08-15 LAB — PATHOLOGIST SMEAR REVIEW

## 2019-08-15 LAB — CULTURE, RESPIRATORY W GRAM STAIN: Culture: NORMAL

## 2019-08-16 LAB — CULTURE, BLOOD (ROUTINE X 2)

## 2019-08-17 LAB — CULTURE, BLOOD (ROUTINE X 2): Culture: NO GROWTH

## 2019-08-18 DIAGNOSIS — G931 Anoxic brain damage, not elsewhere classified: Secondary | ICD-10-CM

## 2019-08-18 LAB — CULTURE, BLOOD (ROUTINE X 2)
Culture: NO GROWTH
Culture: NO GROWTH
Special Requests: ADEQUATE

## 2019-08-23 LAB — SURGICAL PATHOLOGY

## 2019-08-28 NOTE — Progress Notes (Signed)
Pt's medications given to [facility] for disposal.

## 2019-08-28 NOTE — Progress Notes (Signed)
Large urine leak noted around foley.

## 2019-08-28 NOTE — Death Summary Note (Signed)
DEATH SUMMARY   Patient Details  Name: Diane Gilbert MRN: [id number] DOB: Feb 29, 1966  Admission/Discharge Information   Admit Date:  07-Sep-2019  Date of Death: Date of Death: 2019/09/09  Time of Death: Time of Death: 63  Length of Stay: 2  Referring Physician: Nolene Ebbs, MD   Reason(s) for Hospitalization  54 y/o F admitted 06-Sep-2022 s/p cardiac arrest in setting of cocaine use.  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  Required 4 doses EPI, defib x2 before ROSC, > 40 minutes downtime. Concern for poor neuro exam on arrival with myoclonus.  After discussion, family elected for comfort measures.  CDS involved in patient care at this point.   Diagnoses  Preliminary cause of death: Anoxic brain injury ([place]) Secondary Diagnoses (including complications and co-morbidities):  Active Problems:   Alcohol abuse   Hyponatremia   Hypokalemia   Respiratory bronchiolitis interstitial lung disease (HCC)   Interstitial lung disease (HCC)   Schizophrenia (HCC)   Acute diastolic congestive heart failure (HCC)   OSA (obstructive sleep apnea)   Acute on chronic diastolic CHF (congestive heart failure) ([place])   Acute metabolic encephalopathy   Tobacco abuse   Cardiac arrest ([place])   Anoxic brain injury [facility])   Brief Hospital Course (including significant findings, care, treatment, and services provided and events leading to death)  Diane Gilbert is a 54 y.o. year old female who history of CVA, Seizure, chornic diastolic heart failure, schizophrenia, RB-ILD on prednisone 10 mg daily, emphysema, chronic hypoxic respiratory failure on 4 liters of oxygen. Recently admitted for fracture of fibula and found to have acute on chronic hypoxic respiratory failure and treated with abx and diuresis. This evening EMS was contacted by unknown bystander. Patient was noted to  become unresponsive and slid out of her chair. EMS found patient pulseless and apneic. ACLS initiated.Patient defibrillated x2 for V fib and Vtach. Also 4 doses of EPI given. After which ROSC was achieved. Patient had Edison Pace airway that was placed in the field. Promptly replaced by Ed providers. Patient noted to have blood secretions after OG attanmpes and thus NG placed. EKG attained which showed ST segment elevations in inferior leads. Cardiology consulted. CT head ordered before heparin drip initiated. Cooling initiated in the ED with ice packs.  Spoke with Brother Alfy.Whio is next of kin. Patient with no HCPOA. Alfy noted that last night patient sounded well. She did not note any difficulty breathing. Cough, fever or chest pain. She stated she was doing better.  Patient had greater than 40 minutes of downtime with cardiac arrest.  Was defibrillated x2.  Unfortunately had poor neurologic exam upon arrival with mild clonus.  Patient family discussed care with [place] donor service.  They made the decision for transfer to the operating room with withdrawal of care and potential for donor harvesting after death.  Patient was transferred to the operating room under the care of [place] [facility].  And the patient passed on comfort care measures in the operating room.  Pertinent Labs and Studies  Significant Diagnostic Studies CT ABDOMEN PELVIS WO CONTRAST  Result Date: 08/13/2019 CLINICAL DATA:  Cardiac transplant donor assessment. EXAM: CT CHEST, ABDOMEN AND PELVIS WITHOUT CONTRAST TECHNIQUE: Multidetector CT imaging of the chest, abdomen and pelvis was performed following the standard protocol without IV contrast. COMPARISON:  07/03/2019 FINDINGS: CT CHEST FINDINGS Cardiovascular: Mild four-chamber cardiac enlargement. Trace pericardial effusion. No visible  coronary artery calcification. Minimal aortic atherosclerotic calcification. Mediastinum/Nodes: No mass or lymphadenopathy. Endotracheal tube  and nasogastric tube in place. Lungs/Pleura: Dependent pulmonary atelectasis/infiltrate bilaterally. No pleural fluid. Musculoskeletal: Normal CT ABDOMEN PELVIS FINDINGS Hepatobiliary: Liver appears normal without contrast. No calcified gallstones. Pancreas: Normal Spleen: Normal Adrenals/Urinary Tract: Adrenal glands are normal. Kidneys are normal. Bladder contains a catheter but is otherwise normal. Stomach/Bowel: Some wall thickening of the right colon that could go along with colitis. The remainder of the bowel appears unremarkable. Vascular/Lymphatic: Aortic and iliac atherosclerosis. No aneurysm. IVC is normal. No retroperitoneal adenopathy. Reproductive: No pelvic mass. Other: No free fluid or air. Musculoskeletal: Distant lower lumbar decompression and fusion surgery. IMPRESSION: Four-chamber cardiac enlargement.  Trace pericardial effusion. Mild aortic atherosclerotic calcification. Dependent pulmonary atelectasis/infiltrate bilaterally. Apparent wall thickening of the right colon suggesting colitis. No other intraabdominal finding of significance. Electronically Signed   By: Nelson Chimes M.D.   On: 08/13/2019 04:30   CT Head Wo Contrast  Result Date: 08/18/2019 CLINICAL DATA:  Encephalopathy found unresponsive and sliding out of chair at home EXAM: CT HEAD WITHOUT CONTRAST TECHNIQUE: Contiguous axial images were obtained from the base of the skull through the vertex without intravenous contrast. COMPARISON:  MRI 04/02/2019 FINDINGS: Brain: Stable regions of gliosis in the left occipital lobe and bilateral globus pallidi. No new areas of CT evident infarct. No evidence of acute hemorrhage, hydrocephalus, extra-axial collection or mass lesion/mass effect. Patchy areas of white matter hypoattenuation are most compatible with chronic microvascular angiopathy. Vascular: Atherosclerotic calcification of the carotid siphons. No hyperdense vessel. Skull: No scalp swelling or hematoma. Benign dermal  calcifications are similar to priors. No suspicious scalp lesions. No worrisome osseous lesions. Sinuses/Orbits: Transnasal and endotracheal tubes are in place at the time of exam. Pneumatized secretions throughout the ethmoids are likely related to instrumentation. Remaining paranasal sinuses and mastoid air cells are predominantly clear. Included orbital structures are unremarkable. Other: None IMPRESSION: 1. No acute intracranial findings. 2. Stable regions of gliosis in the left occipital lobe and bilateral globus pallidi. 3. Chronic volume loss and white matter angiopathy. Electronically Signed   By: Lovena Le M.D.   On: 08/22/2019 20:57   CT CHEST WO CONTRAST  Result Date: 08/13/2019 CLINICAL DATA:  Cardiac transplant donor assessment. EXAM: CT CHEST, ABDOMEN AND PELVIS WITHOUT CONTRAST TECHNIQUE: Multidetector CT imaging of the chest, abdomen and pelvis was performed following the standard protocol without IV contrast. COMPARISON:  07/03/2019 FINDINGS: CT CHEST FINDINGS Cardiovascular: Mild four-chamber cardiac enlargement. Trace pericardial effusion. No visible coronary artery calcification. Minimal aortic atherosclerotic calcification. Mediastinum/Nodes: No mass or lymphadenopathy. Endotracheal tube and nasogastric tube in place. Lungs/Pleura: Dependent pulmonary atelectasis/infiltrate bilaterally. No pleural fluid. Musculoskeletal: Normal CT ABDOMEN PELVIS FINDINGS Hepatobiliary: Liver appears normal without contrast. No calcified gallstones. Pancreas: Normal Spleen: Normal Adrenals/Urinary Tract: Adrenal glands are normal. Kidneys are normal. Bladder contains a catheter but is otherwise normal. Stomach/Bowel: Some wall thickening of the right colon that could go along with colitis. The remainder of the bowel appears unremarkable. Vascular/Lymphatic: Aortic and iliac atherosclerosis. No aneurysm. IVC is normal. No retroperitoneal adenopathy. Reproductive: No pelvic mass. Other: No free fluid or air.  Musculoskeletal: Distant lower lumbar decompression and fusion surgery. IMPRESSION: Four-chamber cardiac enlargement.  Trace pericardial effusion. Mild aortic atherosclerotic calcification. Dependent pulmonary atelectasis/infiltrate bilaterally. Apparent wall thickening of the right colon suggesting colitis. No other intraabdominal finding of significance. Electronically Signed   By: Nelson Chimes M.D.   On: 08/13/2019 04:30   DG CHEST PORT 1  VIEW  Result Date: 08/13/2019 CLINICAL DATA:  Possible organ donor EXAM: PORTABLE CHEST 1 VIEW COMPARISON:  CT from earlier in the same day. FINDINGS: Cardiac shadow remains enlarged. Endotracheal tube and gastric catheter are again noted and stable. Lungs are well aerated but demonstrates some new left basilar atelectasis. No pneumothorax or effusion is seen. No bony abnormality is noted. IMPRESSION: New left basilar atelectatic changes. Electronically Signed   By: Inez Catalina M.D.   On: 08/13/2019 09:17   DG Chest Portable 1 View  Result Date: 08/05/2019 CLINICAL DATA:  ET tube inserted, check tip position. Post CPR EXAM: PORTABLE CHEST 1 VIEW COMPARISON:  Chest radiograph 07/04/2019 FINDINGS: Interval intubation with endotracheal tube tip between the thoracic inlet and carina. The nasogastric tube courses below the diaphragm with distal tip out of field of view. Stable cardiomediastinal contours with enlarged heart size. There are bilateral indistinct airspace opacities favored to represent mild edema. No evidence of pneumothorax or large pleural effusion. IMPRESSION: 1. Interval intubation with endotracheal tube tip between the thoracic inlet and carina. The nasogastric tube courses below the diaphragm with distal aspect out of field of view. 2. Bilateral indistinct airspace opacities favored to represent mild pulmonary edema. Electronically Signed   By: Audie Pinto M.D.   On: 08/18/2019 19:09    Microbiology Recent Results (from the past 240 hour(s))   Respiratory Panel by RT PCR (Flu A&B, Covid) - Nasopharyngeal Swab     Status: None   Collection Time: 08/18/2019  6:54 PM   Specimen: Nasopharyngeal Swab  Result Value Ref Range Status   SARS Coronavirus 2 by RT PCR NEGATIVE NEGATIVE Final    Comment: (NOTE) SARS-CoV-2 target nucleic acids are NOT DETECTED. The SARS-CoV-2 RNA is generally detectable in upper respiratoy specimens during the acute phase of infection. The lowest concentration of SARS-CoV-2 viral copies this assay can detect is 131 copies/mL. A negative result does not preclude SARS-Cov-2 infection and should not be used as the sole basis for treatment or other patient management decisions. A negative result may occur with  improper specimen collection/handling, submission of specimen other than nasopharyngeal swab, presence of viral mutation(s) within the areas targeted by this assay, and inadequate number of viral copies (<131 copies/mL). A negative result must be combined with clinical observations, patient history, and epidemiological information. The expected result is Negative. Fact Sheet for Patients:  PinkCheek.be Fact Sheet for Healthcare Providers:  GravelBags.it This test is not yet ap proved or cleared by the [place] FDA and  has been authorized for detection and/or diagnosis of SARS-CoV-2 by FDA under an Emergency Use Authorization (EUA). This EUA will remain  in effect (meaning this test can be used) for the duration of the COVID-19 declaration under Section 564(b)(1) of the Act, 21 U.S.C. section 360bbb-3(b)(1), unless the authorization is terminated or revoked sooner.    Influenza A by PCR NEGATIVE NEGATIVE Final   Influenza B by PCR NEGATIVE NEGATIVE Final    Comment: (NOTE) The Xpert Xpress SARS-CoV-2/FLU/RSV assay is intended as an aid in  the diagnosis of influenza from Nasopharyngeal swab specimens and  should not be used as a sole  basis for treatment. Nasal washings and  aspirates are unacceptable for Xpert Xpress SARS-CoV-2/FLU/RSV  testing. Fact Sheet for Patients: PinkCheek.be Fact Sheet for Healthcare Providers: GravelBags.it This test is not yet approved or cleared by the [place] FDA and  has been authorized for detection and/or diagnosis of SARS-CoV-2 by  FDA under an Emergency Use Authorization (  EUA). This EUA will remain  in effect (meaning this test can be used) for the duration of the  Covid-19 declaration under Section 564(b)(1) of the Act, 21  U.S.C. section 360bbb-3(b)(1), unless the authorization is  terminated or revoked. Performed at [facility], [place] [address] [place], [place] [address]   Blood culture (routine x 2)     Status: Abnormal   Collection Time: 08/11/2019  7:02 PM   Specimen: BLOOD  Result Value Ref Range Status   Specimen Description BLOOD LEFT ANTECUBITAL  Final   Special Requests   Final    BOTTLES DRAWN AEROBIC AND ANAEROBIC Blood Culture results may not be optimal due to an inadequate volume of blood received in culture bottles   Culture  Setup Time   Final    GRAM POSITIVE COCCI IN CLUSTERS AEROBIC BOTTLE ONLY CRITICAL RESULT CALLED TO, READ BACK BY AND VERIFIED WITH: Serita Grammes [id number] 09-06-19 Mena Goes Performed at [facility], [place] [address], [place], [place] [address]    Culture (A)  Final    STAPHYLOCOCCUS EPIDERMIDIS CORRECTED ON 02/16 AT 1453: PREVIOUSLY REPORTED AS STAPHYLOCOCCUS SPECIES (COAGULASE NEGATIVE)   Report Status 08/16/2019 FINAL  Final   Organism ID, Bacteria STAPHYLOCOCCUS EPIDERMIDIS  Final      Susceptibility   Staphylococcus epidermidis - MIC*    CIPROFLOXACIN >=8 RESISTANT Resistant     ERYTHROMYCIN >=8 RESISTANT Resistant     GENTAMICIN 8 INTERMEDIATE Intermediate     OXACILLIN >=4 RESISTANT Resistant     TETRACYCLINE 2 SENSITIVE Sensitive     VANCOMYCIN 2  SENSITIVE Sensitive     TRIMETH/SULFA 160 RESISTANT Resistant     CLINDAMYCIN >=8 RESISTANT Resistant     RIFAMPIN <=0.5 SENSITIVE Sensitive     Inducible Clindamycin NEGATIVE Sensitive     * STAPHYLOCOCCUS EPIDERMIDIS CORRECTED ON 02/16 AT 1453: PREVIOUSLY REPORTED AS STAPHYLOCOCCUS SPECIES (COAGULASE NEGATIVE)  Blood culture (routine x 2)     Status: None   Collection Time: 08/05/2019  7:40 PM   Specimen: BLOOD  Result Value Ref Range Status   Specimen Description BLOOD SITE NOT SPECIFIED  Final   Special Requests   Final    BOTTLES DRAWN AEROBIC AND ANAEROBIC Blood Culture results may not be optimal due to an inadequate volume of blood received in culture bottles   Culture   Final    NO GROWTH 5 DAYS Performed at [facility], [address], [place], [place] [address]    Report Status 08/17/2019 FINAL  Final  Culture, Urine     Status: None   Collection Time: 08/06/2019  7:49 PM   Specimen: Urine, Catheterized  Result Value Ref Range Status   Specimen Description URINE, CATHETERIZED  Final   Special Requests ADDED 0517 08/13/2019  Final   Culture   Final    NO GROWTH Performed at [facility], [place] [address], [place],  [address]    Report Status 09-06-19 FINAL  Final  Culture, blood (Routine X 2) w Reflex to ID Panel     Status: None   Collection Time: 08/13/19  3:11 AM   Specimen: BLOOD  Result Value Ref Range Status   Specimen Description BLOOD RIGHT ANTECUBITAL  Final   Special Requests   Final    BOTTLES DRAWN AEROBIC AND ANAEROBIC Blood Culture adequate volume   Culture   Final    NO GROWTH 5 DAYS Performed at [facility], [place] [address],  [place], [place] [address]    Report Status 08/18/2019 FINAL  Final  Culture, blood (Routine X 2) w Reflex to ID Panel     Status: None   Collection Time: 08/13/19  3:21 AM   Specimen: BLOOD RIGHT HAND  Result Value Ref Range Status   Specimen Description BLOOD RIGHT HAND  Final   Special Requests   Final     BOTTLES DRAWN AEROBIC ONLY Blood Culture results may not be optimal due to an inadequate volume of blood received in culture bottles   Culture   Final    NO GROWTH 5 DAYS Performed at [facility], [place] [address] [place], [place] [address]    Report Status 08/18/2019 FINAL  Final  Expectorated sputum assessment w rflx to resp cult     Status: None   Collection Time: 08/13/19 12:26 PM   Specimen: Expectorated Sputum  Result Value Ref Range Status   Specimen Description EXPECTORATED SPUTUM  Final   Special Requests NONE  Final   Sputum evaluation   Final    THIS SPECIMEN IS ACCEPTABLE FOR SPUTUM CULTURE Performed at [facility], [place] [address] [place], [place] [address]    Report Status 08/13/2019 FINAL  Final  Culture, respiratory     Status: None   Collection Time: 08/13/19 12:26 PM  Result Value Ref Range Status   Specimen Description EXPECTORATED SPUTUM  Final   Special Requests NONE Reflexed from [id number]  Final   Gram Stain   Final    ABUNDANT WBC PRESENT, PREDOMINANTLY PMN ABUNDANT GRAM POSITIVE COCCI IN PAIRS MODERATE GRAM POSITIVE COCCI IN CLUSTERS FEW GRAM POSITIVE RODS MODERATE GRAM NEGATIVE COCCOBACILLI NO SQUAMOUS EPITHELIAL CELLS PRESENT    Culture   Final    Consistent with normal respiratory flora. Performed at [facility], [place] [address] [place], [place] [address]    Report Status 08/15/2019 FINAL  Final    Lab Basic Metabolic Panel: Recent Labs  Lab 08/02/2019 1902 08/11/2019 2019 08/13/19 0329 08/13/19 0345 08/13/19 1226 08/13/19 1844 08/13/19 2303 08/25/19 0100 2019-08-25 0240  NA 139   < > 138   < > 140 138 138 139 139  K 4.3   < > 4.0   < > 4.0 3.7 3.6 3.6 3.7  CL 91*  --  96*  --  97* 96*  --   --  99  CO2 24  --  29  --  28 29  --   --  29  GLUCOSE 241*  --  197*  --  160* 122*  --   --  139*  BUN 8  --  12  --  12 12  --   --  12  CREATININE 1.23*  --  1.00  --  1.05* 0.82  --   --  1.22*  CALCIUM 9.3  --  8.6*  --   8.6* 8.2*  --   --  8.1*  MG  --   --  1.5*  --   --  2.1  --   --  1.8  PHOS  --   --  1.5*  --   --  3.0  --   --  3.0   < > = values in this interval not displayed.   Liver Function Tests: Recent Labs  Lab 08/07/2019 1902 08/13/19 0329 08/13/19 1226 08/13/19 1844 08/25/19 0240  AST 48* 52* 54* 51* 63*  ALT _0 ALKPHOS  116 83 79 70 60  BILITOT 0.7 0.8 0.8 0.8 0.8  PROT 7.4 6.9 6.7 6.6 6.5  ALBUMIN 3.5 3.3* 3.2* 2.9* 2.8*   No results for input(s): LIPASE, AMYLASE in the last 168 hours. No results for input(s): AMMONIA in the last 168 hours. CBC: Recent Labs  Lab 08/10/2019 1902 08/11/2019 2019 08/13/19 0329 08/13/19 0329 08/13/19 0345 08/13/19 1844 08/13/19 2303 08/26/2019 0100 26-Aug-2019 0240  WBC 23.8*  --  19.6*  --   --  24.4*  --   --  21.2*  NEUTROABS 4.7  --  17.0*  --   --  19.5*  --   --   --   HGB 17.0*   < > 17.0*   < > 19.0* 15.5* 17.0* 17.0* 15.1*  HCT 63.4*   < > 56.9*   < > 56.0* 51.0* 50.0* 50.0* 49.8*  MCV 100.0  --  91.0  --   --  87.9  --   --  88.8  PLT 193  --  199  --   --  189  --   --  175   < > = values in this interval not displayed.   Cardiac Enzymes: No results for input(s): CKTOTAL, CKMB, CKMBINDEX, TROPONINI in the last 168 hours. Sepsis Labs: Recent Labs  Lab 08/07/2019 1902 08/13/19 0329 08/13/19 0943 08/13/19 1844 08/13/19 2210 August 26, 2019 0240  WBC 23.8* 19.6*  --  24.4*  --  21.2*  LATICACIDVEN >11.0*  --  3.9* 3.2* 3.2*  --      Hagan Vanauken L Charisa Twitty 08/18/2019, 10:04 AM

## 2019-08-28 NOTE — Progress Notes (Addendum)
[facility] PCCM Rounding Note    Brief Hx: 54 y/o F admitted 2/13 s/p cardiac arrest in setting of cocaine use.  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  Required 4 doses EPI, defib x2 before ROSC, > 40 minutes downtime. Concern for poor neuro exam on arrival with myoclonus.  After discussion, family elected for comfort measures.  CDS involved in patient care at this point.    S:  RN reports tmax 101 No acute events  O: Vitals:   August 24, 2019 0825 24-Aug-2019 0900  BP: (!) 150/71 127/62  Pulse:  (!) 104  Resp: (!) 26   Temp:    SpO2: 99% 100%   General: adult female on vent in NAD HEENT: MM pink/moist, ETT Neuro: sedate, breathes on PSV, no response to verbal stimuli  CV: s1s2 RRR, ST on monitor, no m/r/g PULM:  Non-labored on vent, lungs bilaterally coarse  GI: soft, bsx4 active  Extremities: warm/dry, no edema  Skin: no rashes or lesions  A: PEA / VT arrest - prolonged down time, >40 minutes  Anoxic Brain Injury  Cocaine Abuse  Hypertension  Acute Hypoxic Respiratory Failure s/p Cardiac Arrest  Myoclonus  Seizure Disorder Hypomagnesemia  Hypophosphatemia  Leukocytosis  RBILD - on prednisone  O2 Dependent Chronic Respiratory Failure   P: Comfort measures in place Heparin ordered per CDS request for possible renal organ recovery Continue all measures for patients comfort  Supportive care    Noe Gens, MSN, NP-C [facility] 08-24-2019, 9:58 AM   Please see [email or web address] for pager details.    PCCM attending:  I agree with the above documentation by Kerney Elbe, NP.  Patient going to the OR today for organ donation.  Garner Nash, DO [facility] 08-24-19 11:43 AM

## 2019-08-28 NOTE — Progress Notes (Signed)
Pt now asystole with Absent blood pressure and no heart sounds audible x 1 minute. Confirmed with Berniece Salines RN and Brayton Layman RN. CDS Tom in [place].

## 2019-08-28 NOTE — Progress Notes (Addendum)
9 ml of Heparin (Remaining dose from organ procurement), 220 ml of Fentanyl and 45 ml of Versed wasted in stericycle. Witnessed by Berniece Salines RN.

## 2019-08-28 NOTE — Progress Notes (Signed)
Patient was taken to OR as Donor. Patient was extubated in OR at 14:32.

## 2019-08-28 NOTE — Progress Notes (Signed)
Myriam Jacobson from CDS called. Updated of pt condition. Informed of inability to measure accurate urine output due to leaking foley catheter. Per Myriam Jacobson, would prefer more accurate measurements. Charge nurse notified.

## 2019-08-28 NOTE — Progress Notes (Signed)
Pt's current temp now at 101.2 F. No PRN meds for fever at this time. Brandi NP notified.

## 2019-08-28 DEATH — deceased

## 2019-09-02 ENCOUNTER — Other Ambulatory Visit (HOSPITAL_COMMUNITY): Payer: Medicaid Other

## 2019-09-14 ENCOUNTER — Ambulatory Visit: Payer: Medicaid Other | Admitting: Cardiology

## 2020-12-09 IMAGING — DX DG CHEST 1V PORT
1 series · 1 of 1 positions shown · non-contrast
Comparison: View chest x-ray 02/26/2019.  CT chest 02/22/2019

CLINICAL DATA: Pulmonary edema.

EXAM:
PORTABLE CHEST 1 VIEW

[chest ap]
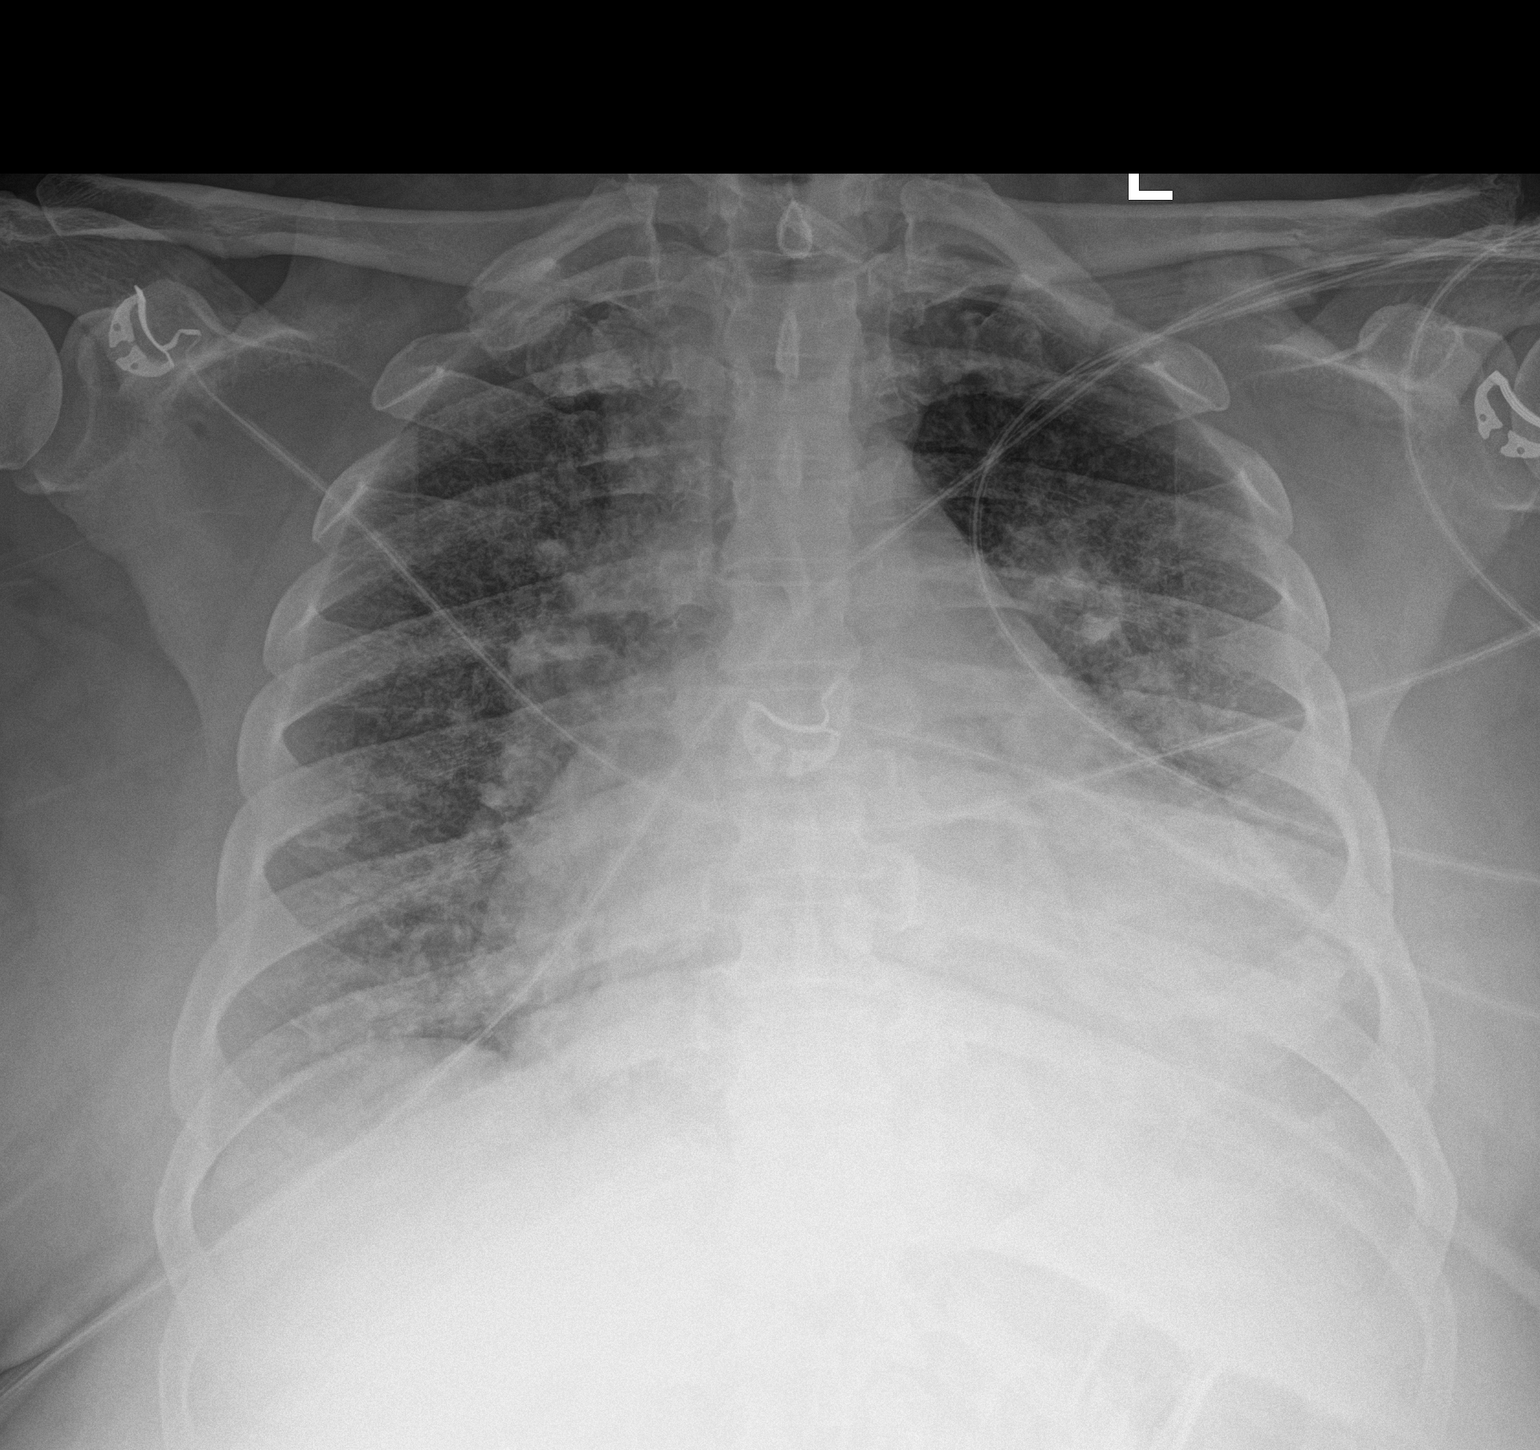

[1 of 1 positions shown; findings below may reference images not displayed]

FINDINGS: The heart is enlarged. Interstitial and airspace opacities
demonstrates slight increase since the prior exam. Left greater than
right basilar airspace opacities are present. Left pleural effusion
is suspected.
IMPRESSION: 1. Cardiomegaly and increasing interstitial edema consistent with
congestive heart failure.
2. Left greater than right pleural effusions and associated airspace
disease. While this likely reflects atelectasis, infection is not
excluded.

## 2021-08-22 NOTE — Progress Notes (Signed)
X °
